# Patient Record
Sex: Female | Born: 1965
Health system: Southern US, Community
[De-identification: ages and names within clinical notes are randomized; demographics above are authoritative.]

## PROBLEM LIST (undated history)

## (undated) DIAGNOSIS — E538 Deficiency of other specified B group vitamins: Secondary | ICD-10-CM

## (undated) DIAGNOSIS — R7303 Prediabetes: Secondary | ICD-10-CM

## (undated) DIAGNOSIS — F419 Anxiety disorder, unspecified: Secondary | ICD-10-CM

## (undated) DIAGNOSIS — G4730 Sleep apnea, unspecified: Secondary | ICD-10-CM

## (undated) DIAGNOSIS — I10 Essential (primary) hypertension: Secondary | ICD-10-CM

## (undated) DIAGNOSIS — M1990 Unspecified osteoarthritis, unspecified site: Secondary | ICD-10-CM

## (undated) DIAGNOSIS — D649 Anemia, unspecified: Secondary | ICD-10-CM

## (undated) DIAGNOSIS — M4316 Spondylolisthesis, lumbar region: Secondary | ICD-10-CM

## (undated) DIAGNOSIS — E119 Type 2 diabetes mellitus without complications: Secondary | ICD-10-CM

## (undated) DIAGNOSIS — Z862 Personal history of diseases of the blood and blood-forming organs and certain disorders involving the immune mechanism: Secondary | ICD-10-CM

## (undated) DIAGNOSIS — F32A Depression, unspecified: Secondary | ICD-10-CM

## (undated) DIAGNOSIS — M545 Low back pain, unspecified: Secondary | ICD-10-CM

## (undated) DIAGNOSIS — E669 Obesity, unspecified: Secondary | ICD-10-CM

## (undated) DIAGNOSIS — E785 Hyperlipidemia, unspecified: Secondary | ICD-10-CM

## (undated) DIAGNOSIS — F329 Major depressive disorder, single episode, unspecified: Secondary | ICD-10-CM

## (undated) DIAGNOSIS — M7918 Myalgia, other site: Secondary | ICD-10-CM

## (undated) DIAGNOSIS — K219 Gastro-esophageal reflux disease without esophagitis: Secondary | ICD-10-CM

## (undated) HISTORY — DX: Spondylolisthesis, lumbar region: M43.16

## (undated) HISTORY — DX: Hyperlipidemia, unspecified: E78.5

## (undated) HISTORY — PX: LUMBAR LAMINECTOMY: SHX95

## (undated) HISTORY — DX: Anemia, unspecified: D64.9

## (undated) HISTORY — PX: TUBAL LIGATION: SHX77

## (undated) HISTORY — PX: ABDOMINAL HYSTERECTOMY: SHX81

## (undated) HISTORY — DX: Low back pain, unspecified: M54.50

## (undated) HISTORY — DX: Myalgia, other site: M79.18

## (undated) HISTORY — DX: Obesity, unspecified: E66.9

## (undated) HISTORY — DX: Anxiety disorder, unspecified: F41.9

---
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
## 2009-05-09 IMAGING — CR DG KNEE COMPLETE 4+V*R*
4 series · 4 of 4 positions shown · non-contrast
Comparison: None.

CLINICAL DATA: Right knee pain with inability to bear weight.

RIGHT KNEE - COMPLETE 4+ VIEW

[t knee ap right]
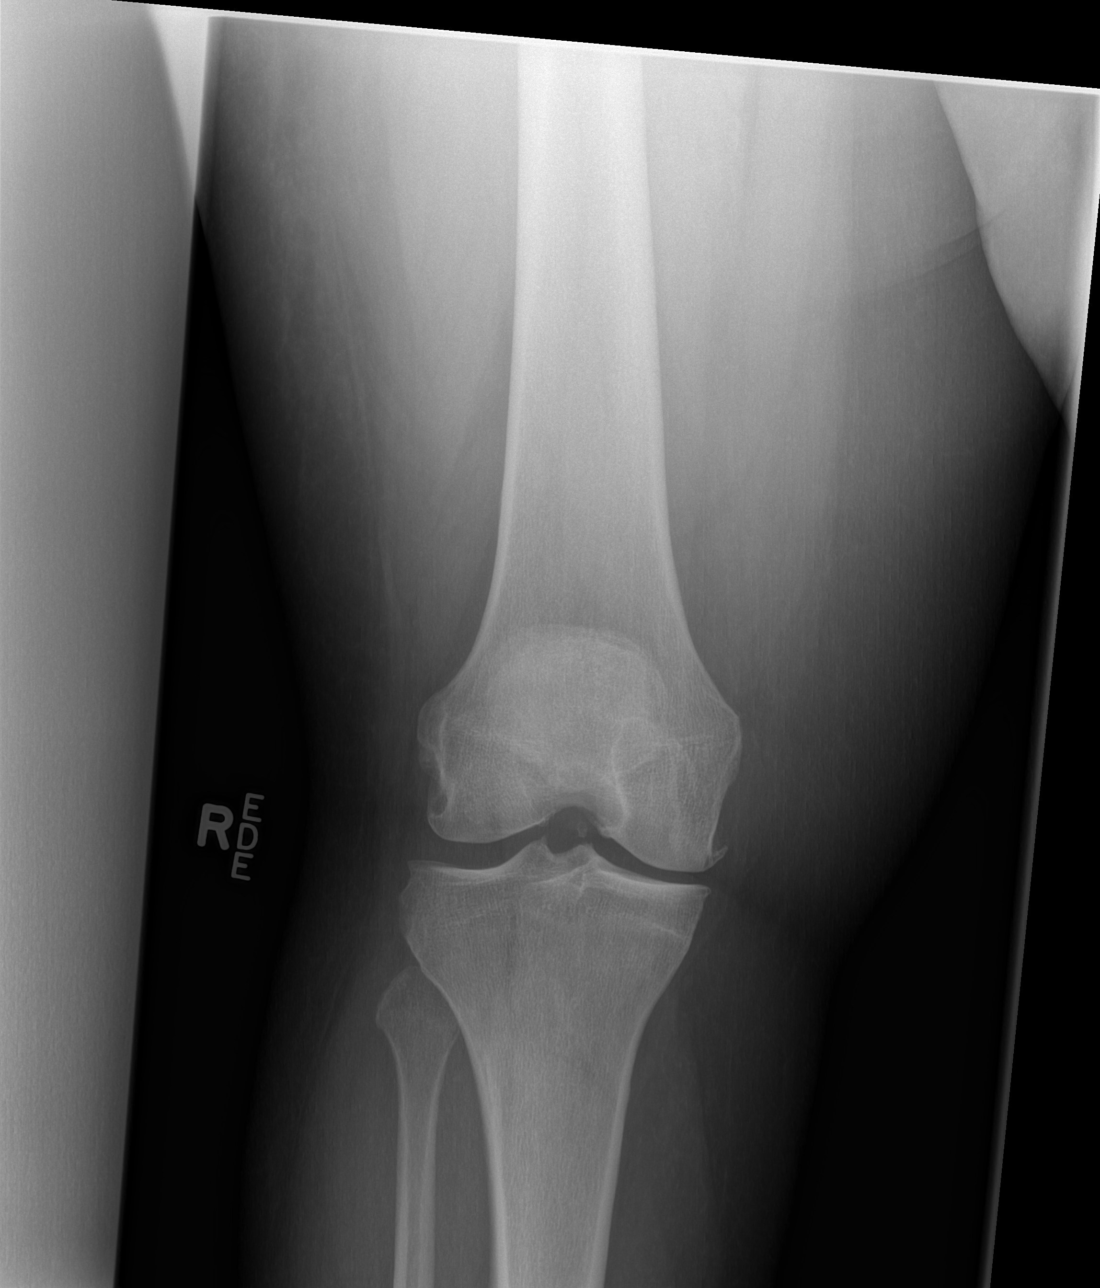

[t knee oblique right (1 of 2)]
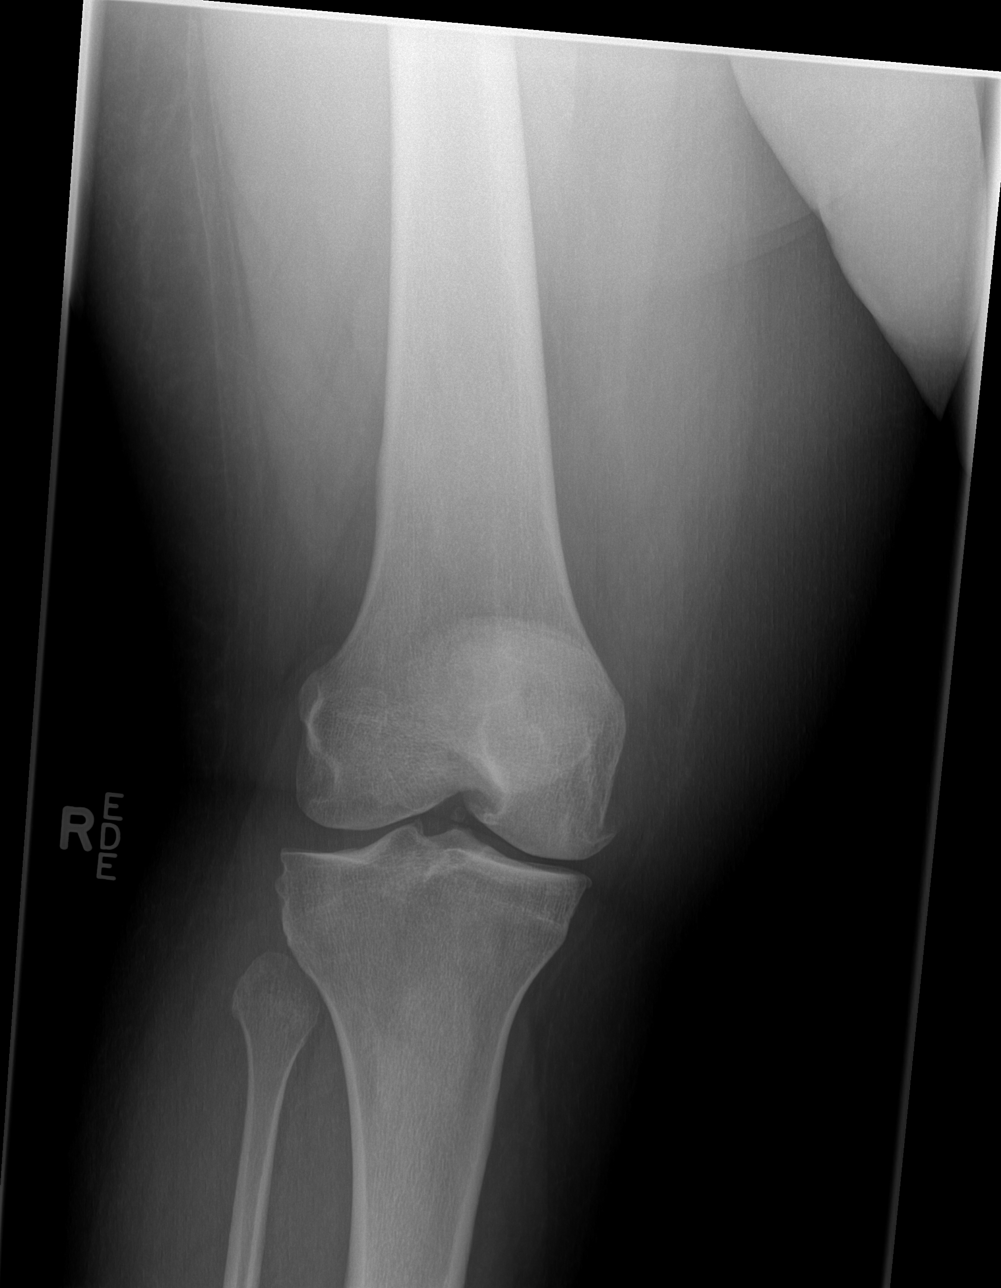

[t knee oblique right (2 of 2)]
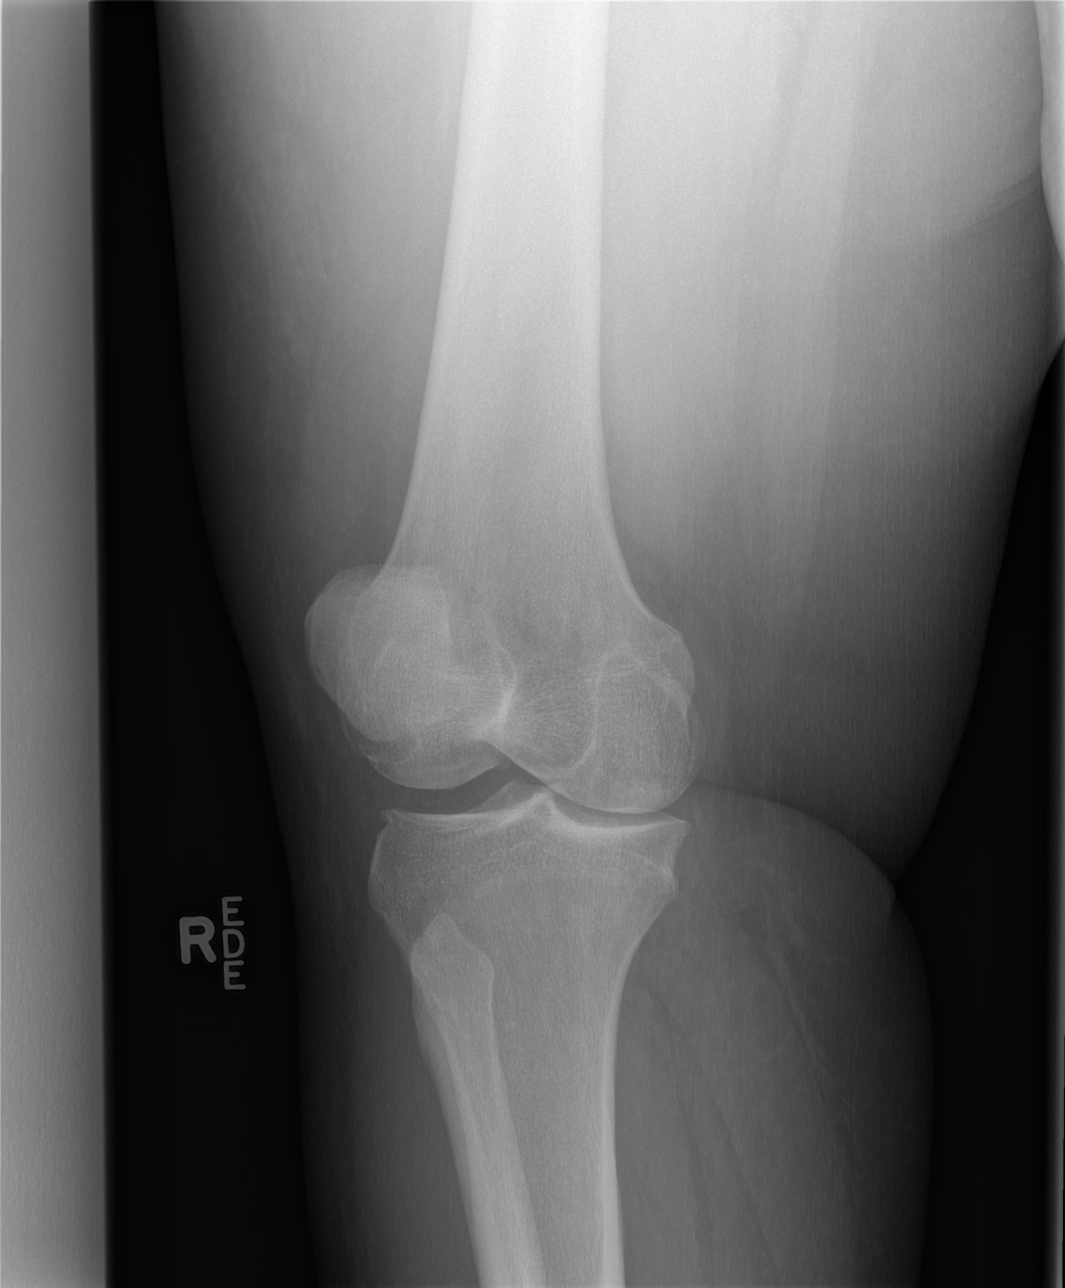

[t knee lat right]
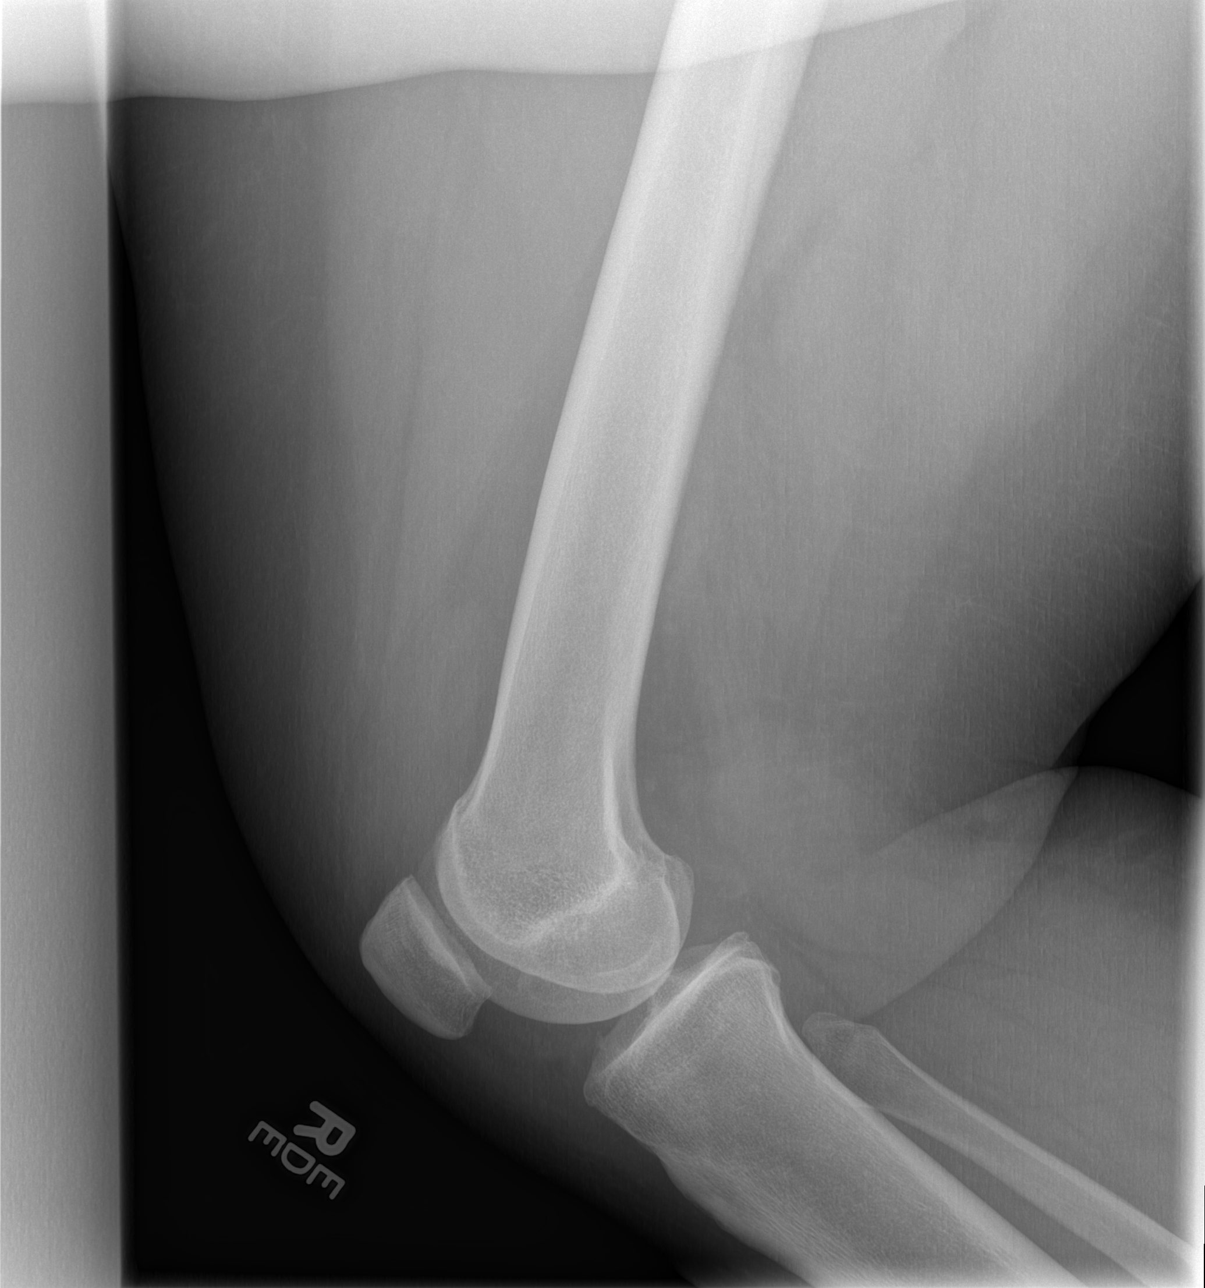

[4 of 4 positions shown; findings below may reference images not displayed]

FINDINGS: There are moderate degenerative changes noted of the
medial compartment.  There is no definite fracture or dislocation.
Possible small joint effusion on the lateral view.
IMPRESSION: Degenerative changes and possible small joint effusion - otherwise
no acute findings.

## 2009-10-21 ENCOUNTER — Emergency Department (HOSPITAL_COMMUNITY): Admission: EM | Admit: 2009-10-21 | Discharge: 2009-10-21 | Payer: Self-pay | Admitting: Emergency Medicine

## 2010-01-05 ENCOUNTER — Ambulatory Visit: Payer: Self-pay | Admitting: Interventional Radiology

## 2010-01-05 ENCOUNTER — Emergency Department (HOSPITAL_BASED_OUTPATIENT_CLINIC_OR_DEPARTMENT_OTHER): Admission: EM | Admit: 2010-01-05 | Discharge: 2010-01-05 | Payer: Self-pay | Admitting: Emergency Medicine
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
## 2010-04-20 ENCOUNTER — Emergency Department (HOSPITAL_BASED_OUTPATIENT_CLINIC_OR_DEPARTMENT_OTHER): Admission: EM | Admit: 2010-04-20 | Discharge: 2010-04-20 | Payer: Self-pay | Admitting: Emergency Medicine

## 2010-04-20 ENCOUNTER — Ambulatory Visit: Payer: Self-pay | Admitting: Diagnostic Radiology

## 2010-04-20 IMAGING — CR DG LUMBAR SPINE COMPLETE 4+V
5 series · 5 of 5 positions shown · non-contrast
Comparison: [DATE]

CLINICAL DATA: Low back pain for 1 week.

LUMBAR SPINE - COMPLETE 4+ VIEW

[t l-spine a.p.]
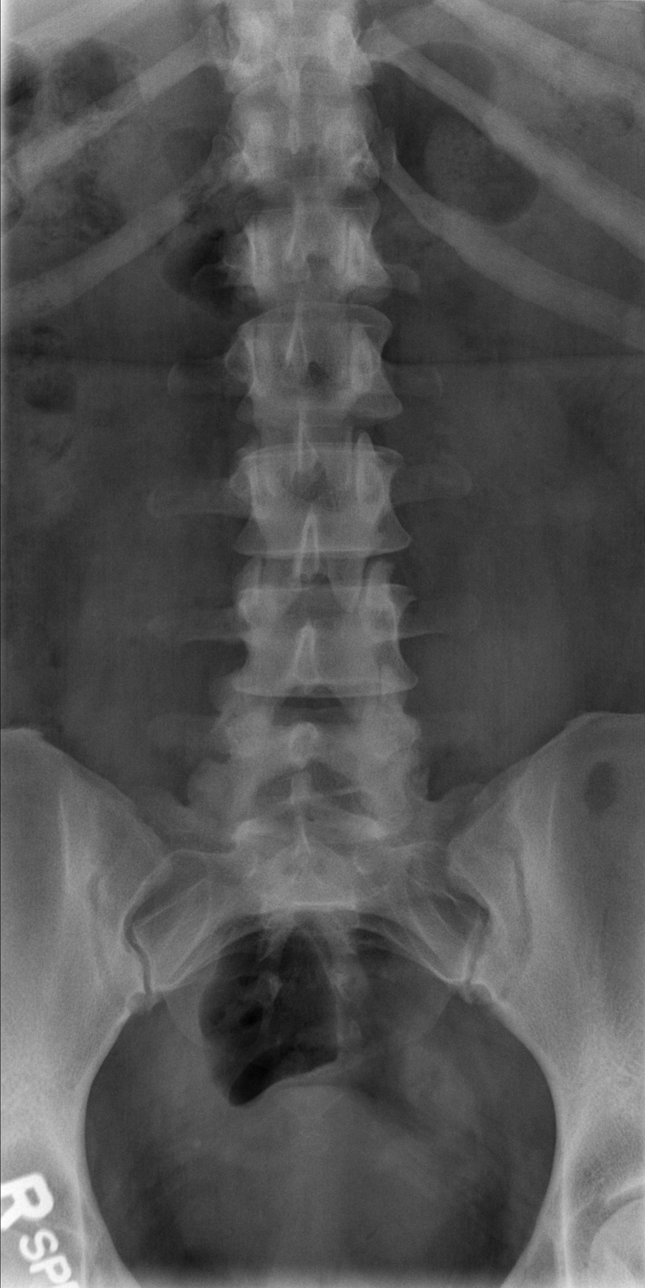

[t l-spine oblique exposure (1 of 2)]
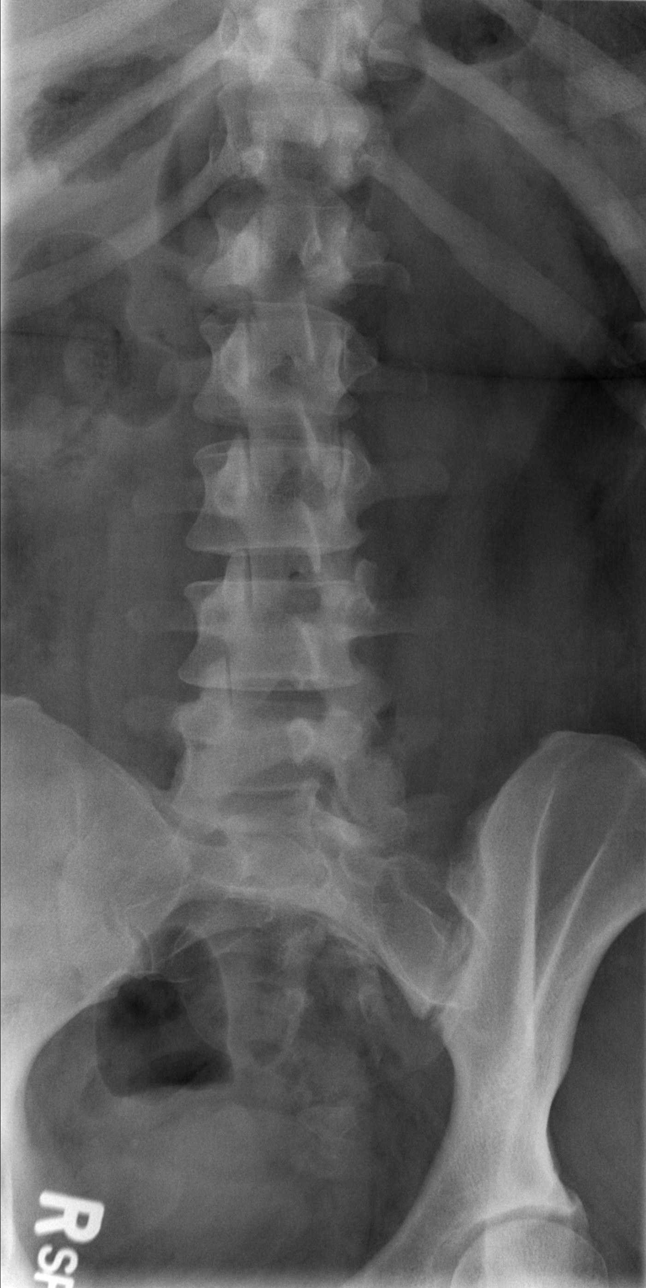

[t l-spine oblique exposure (2 of 2)]
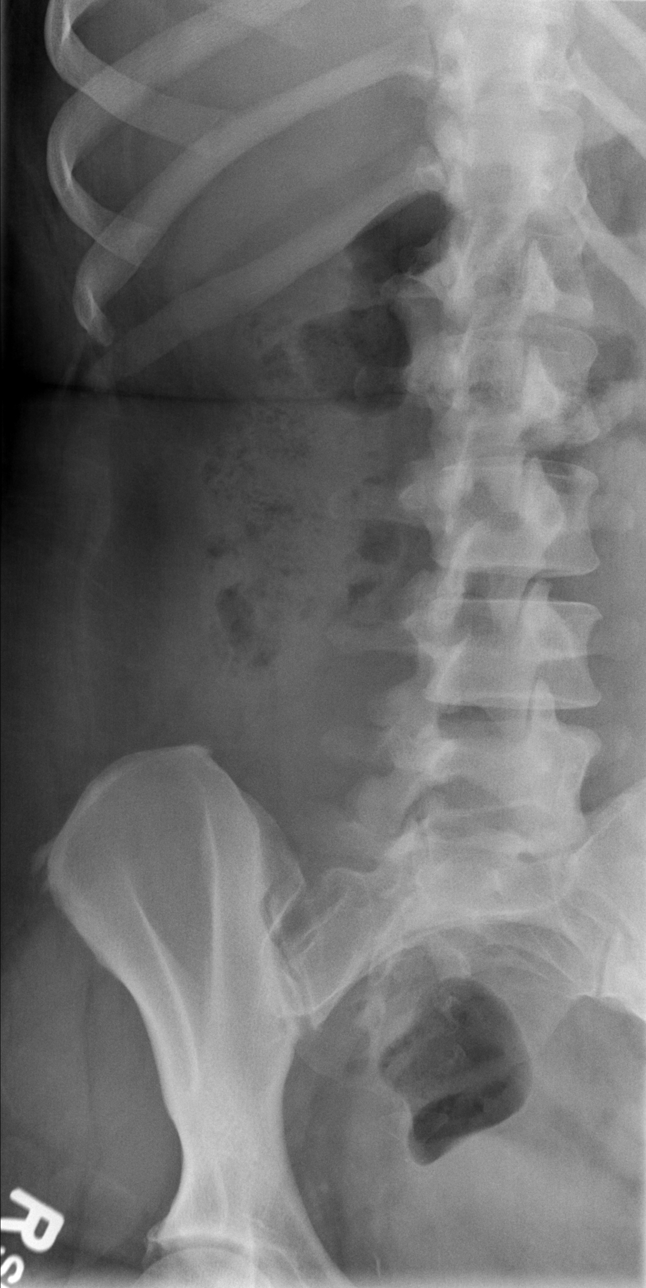

[t l-spine lat]
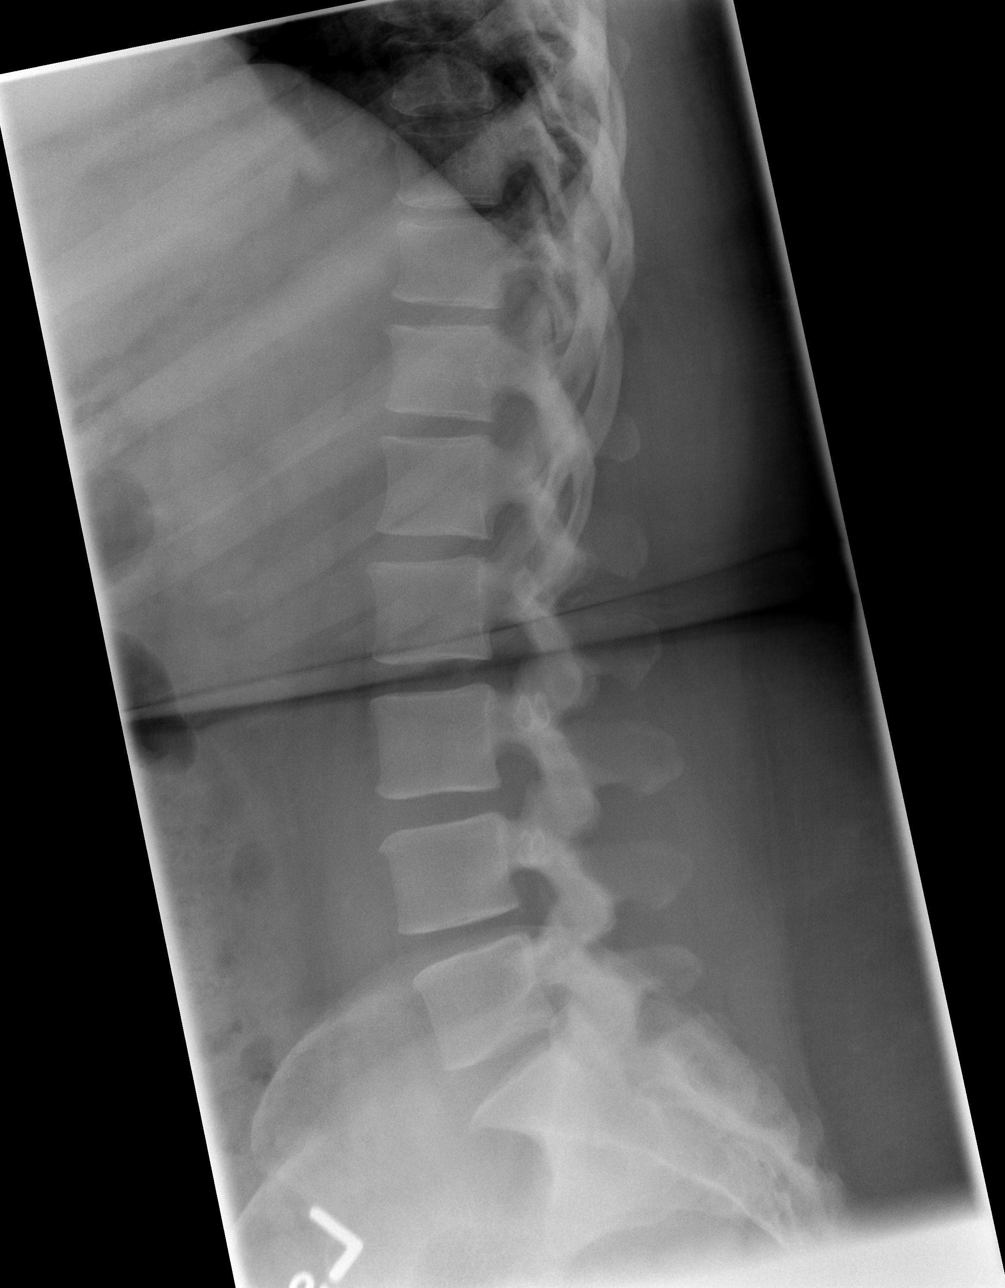

[t l-spine l5-s1 spot]
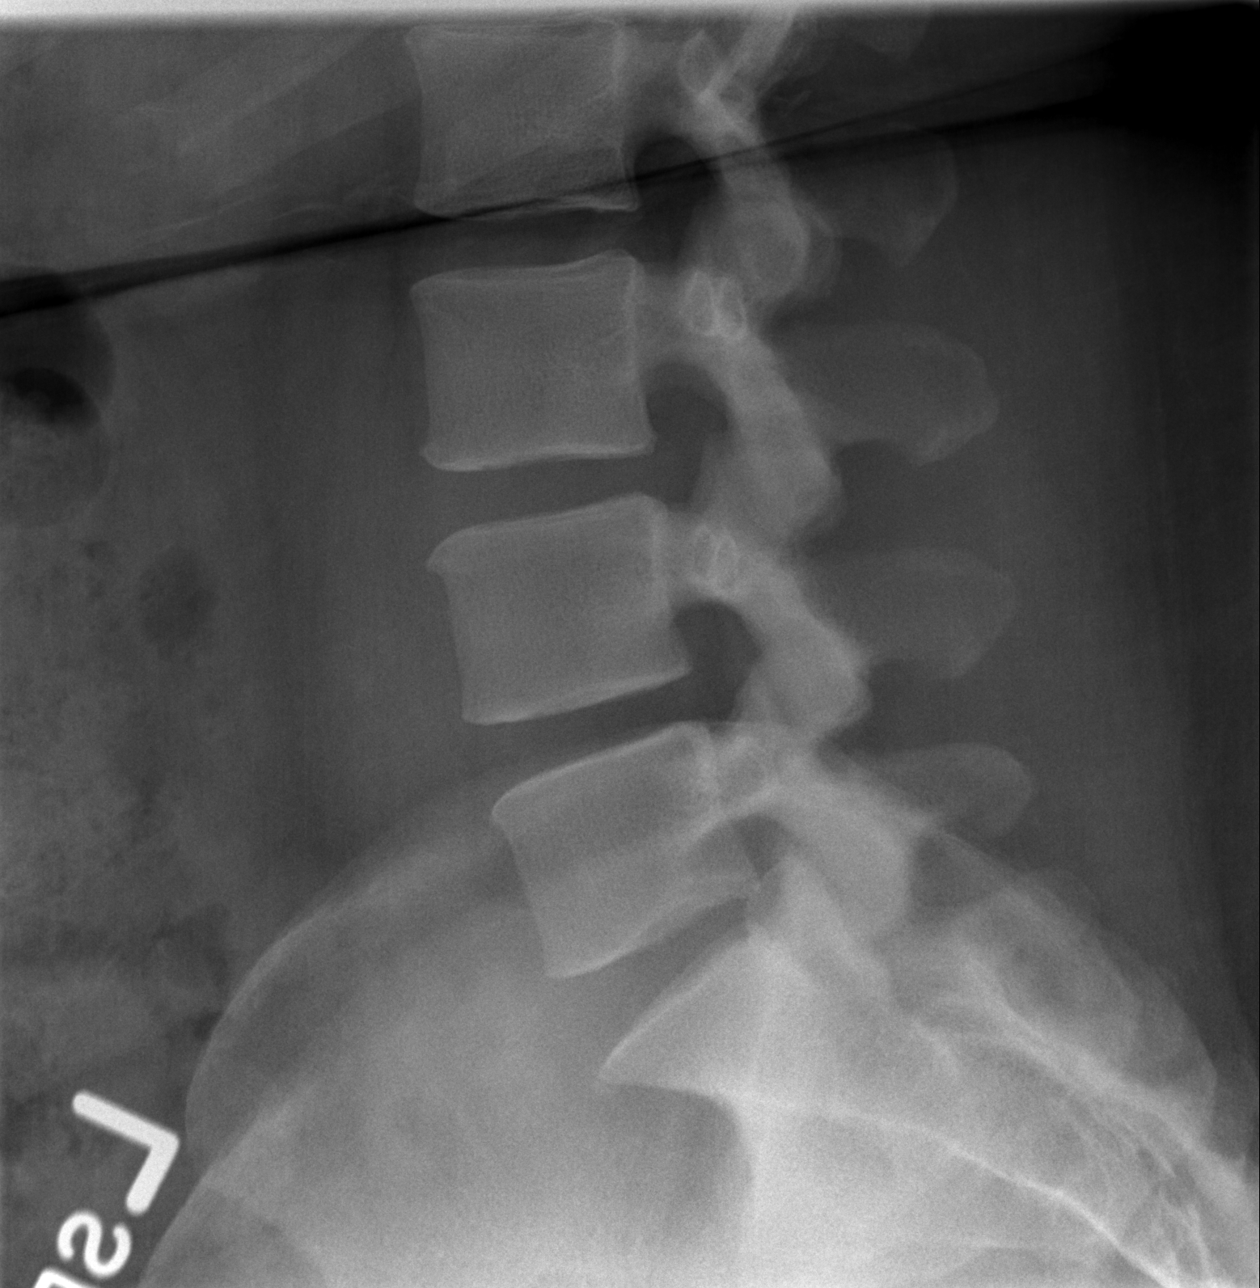

[5 of 5 positions shown; findings below may reference images not displayed]

FINDINGS: Lumbar vertebral alignment appears normal.

No lumbar spine fracture or acute subluxation is identified.  No
acute lumbar spine findings noted.
IMPRESSION: No significant abnormality identified.

## 2011-02-23 LAB — URINALYSIS, ROUTINE W REFLEX MICROSCOPIC
Bilirubin Urine: NEGATIVE
Glucose, UA: NEGATIVE mg/dL
Ketones, ur: NEGATIVE mg/dL
Leukocytes, UA: NEGATIVE
Nitrite: NEGATIVE
Protein, ur: NEGATIVE mg/dL
Specific Gravity, Urine: 1.021 (ref 1.005–1.030)
Urobilinogen, UA: 0.2 mg/dL (ref 0.0–1.0)
pH: 5.5 (ref 5.0–8.0)

## 2011-02-23 LAB — URINE MICROSCOPIC-ADD ON

## 2012-08-14 ENCOUNTER — Emergency Department (HOSPITAL_BASED_OUTPATIENT_CLINIC_OR_DEPARTMENT_OTHER)
Admission: EM | Admit: 2012-08-14 | Discharge: 2012-08-14 | Disposition: A | Discharge status: Home / Self Care | Payer: Self-pay | Attending: Emergency Medicine | Admitting: Emergency Medicine

## 2012-08-14 ENCOUNTER — Emergency Department (HOSPITAL_BASED_OUTPATIENT_CLINIC_OR_DEPARTMENT_OTHER): Payer: Self-pay

## 2012-08-14 ENCOUNTER — Encounter (HOSPITAL_BASED_OUTPATIENT_CLINIC_OR_DEPARTMENT_OTHER): Payer: Self-pay | Admitting: *Deleted

## 2012-08-14 DIAGNOSIS — I10 Essential (primary) hypertension: Secondary | ICD-10-CM | POA: Insufficient documentation

## 2012-08-14 DIAGNOSIS — M25512 Pain in left shoulder: Secondary | ICD-10-CM

## 2012-08-14 DIAGNOSIS — F17200 Nicotine dependence, unspecified, uncomplicated: Secondary | ICD-10-CM | POA: Insufficient documentation

## 2012-08-14 DIAGNOSIS — M25519 Pain in unspecified shoulder: Secondary | ICD-10-CM | POA: Insufficient documentation

## 2012-08-14 HISTORY — DX: Essential (primary) hypertension: I10

## 2012-08-14 IMAGING — CR DG SHOULDER 2+V*L*
3 series · 3 of 3 positions shown · non-contrast
Comparison: No priors.

CLINICAL DATA: Left-sided shoulder pain.

LEFT SHOULDER - 2+ VIEW

[w shoulder ap internal left]
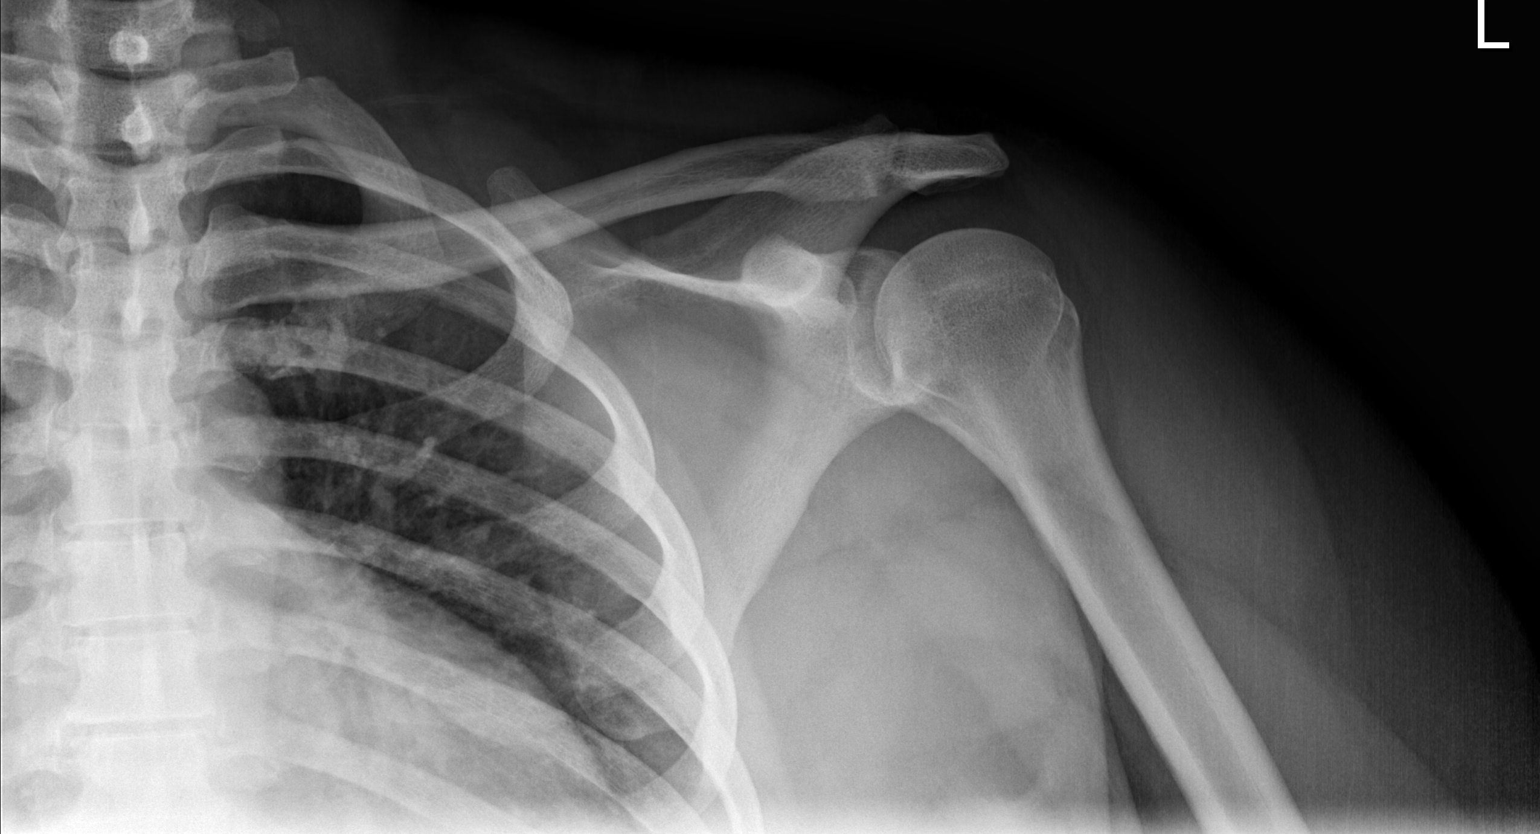

[w shoulder ap external left]
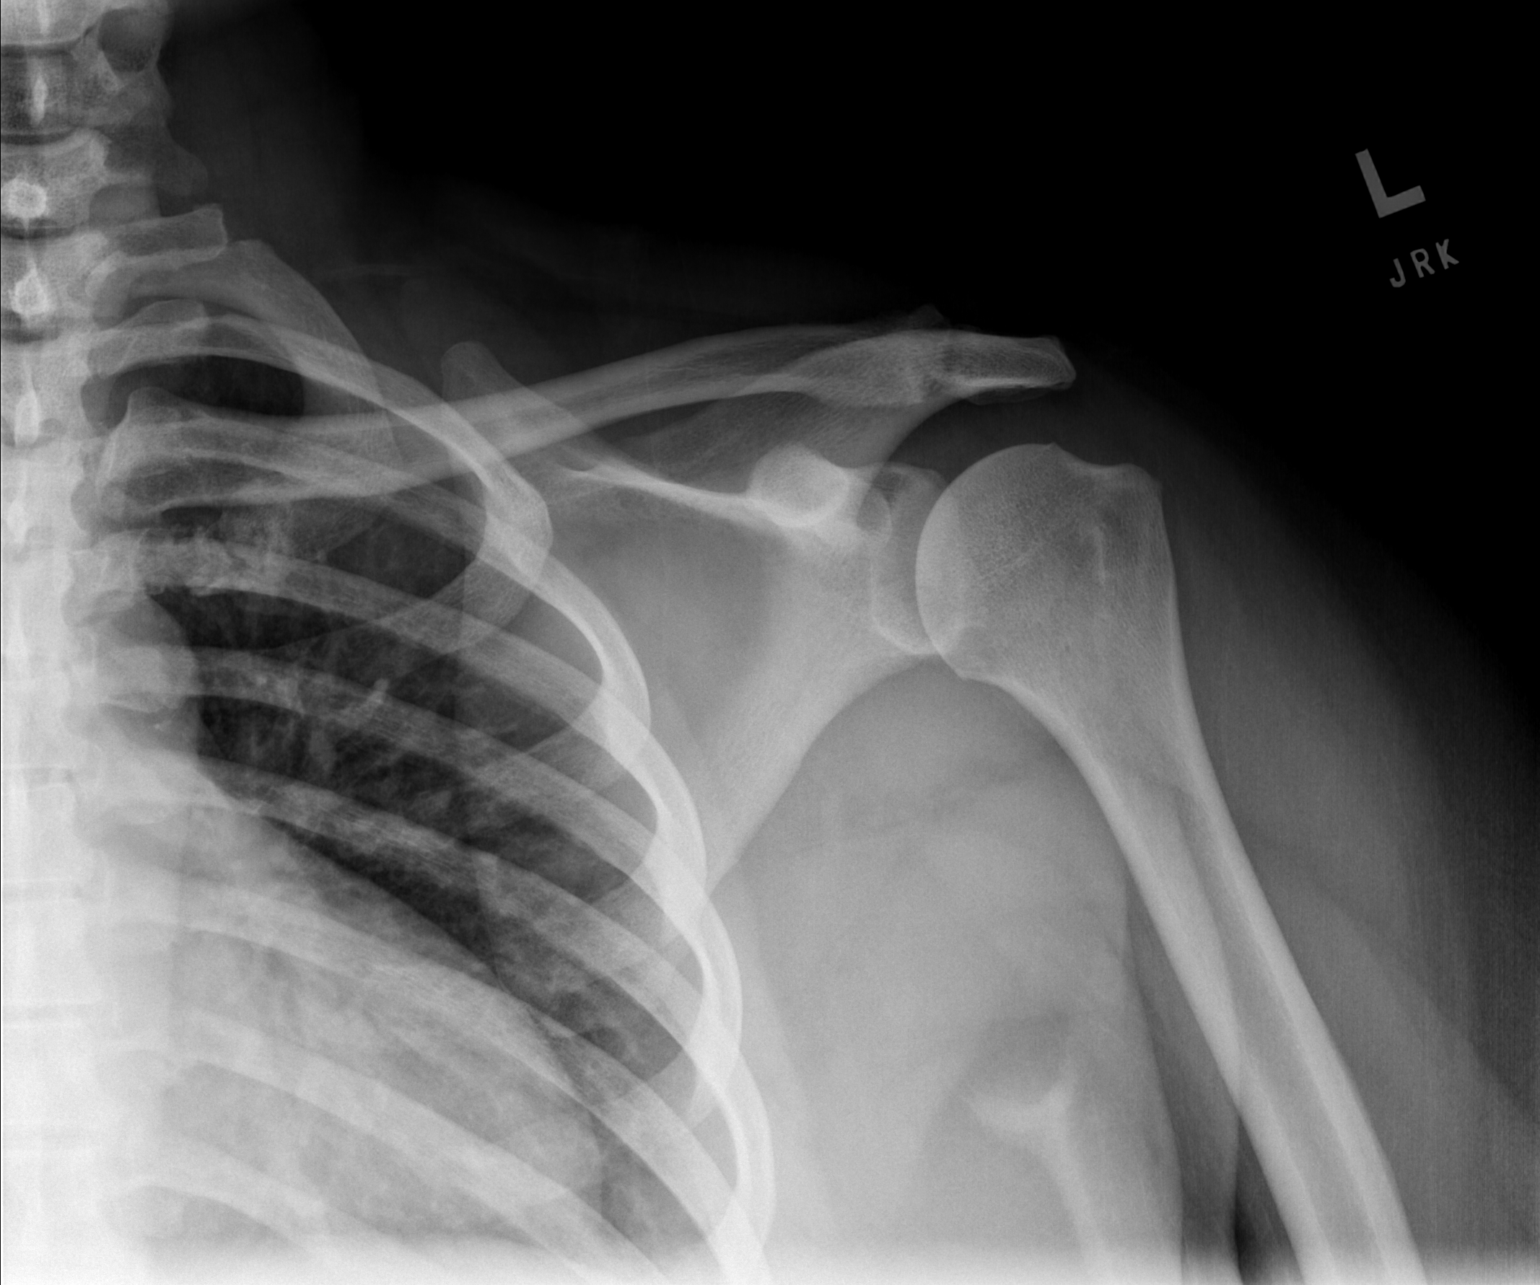

[w shoulder y view left]
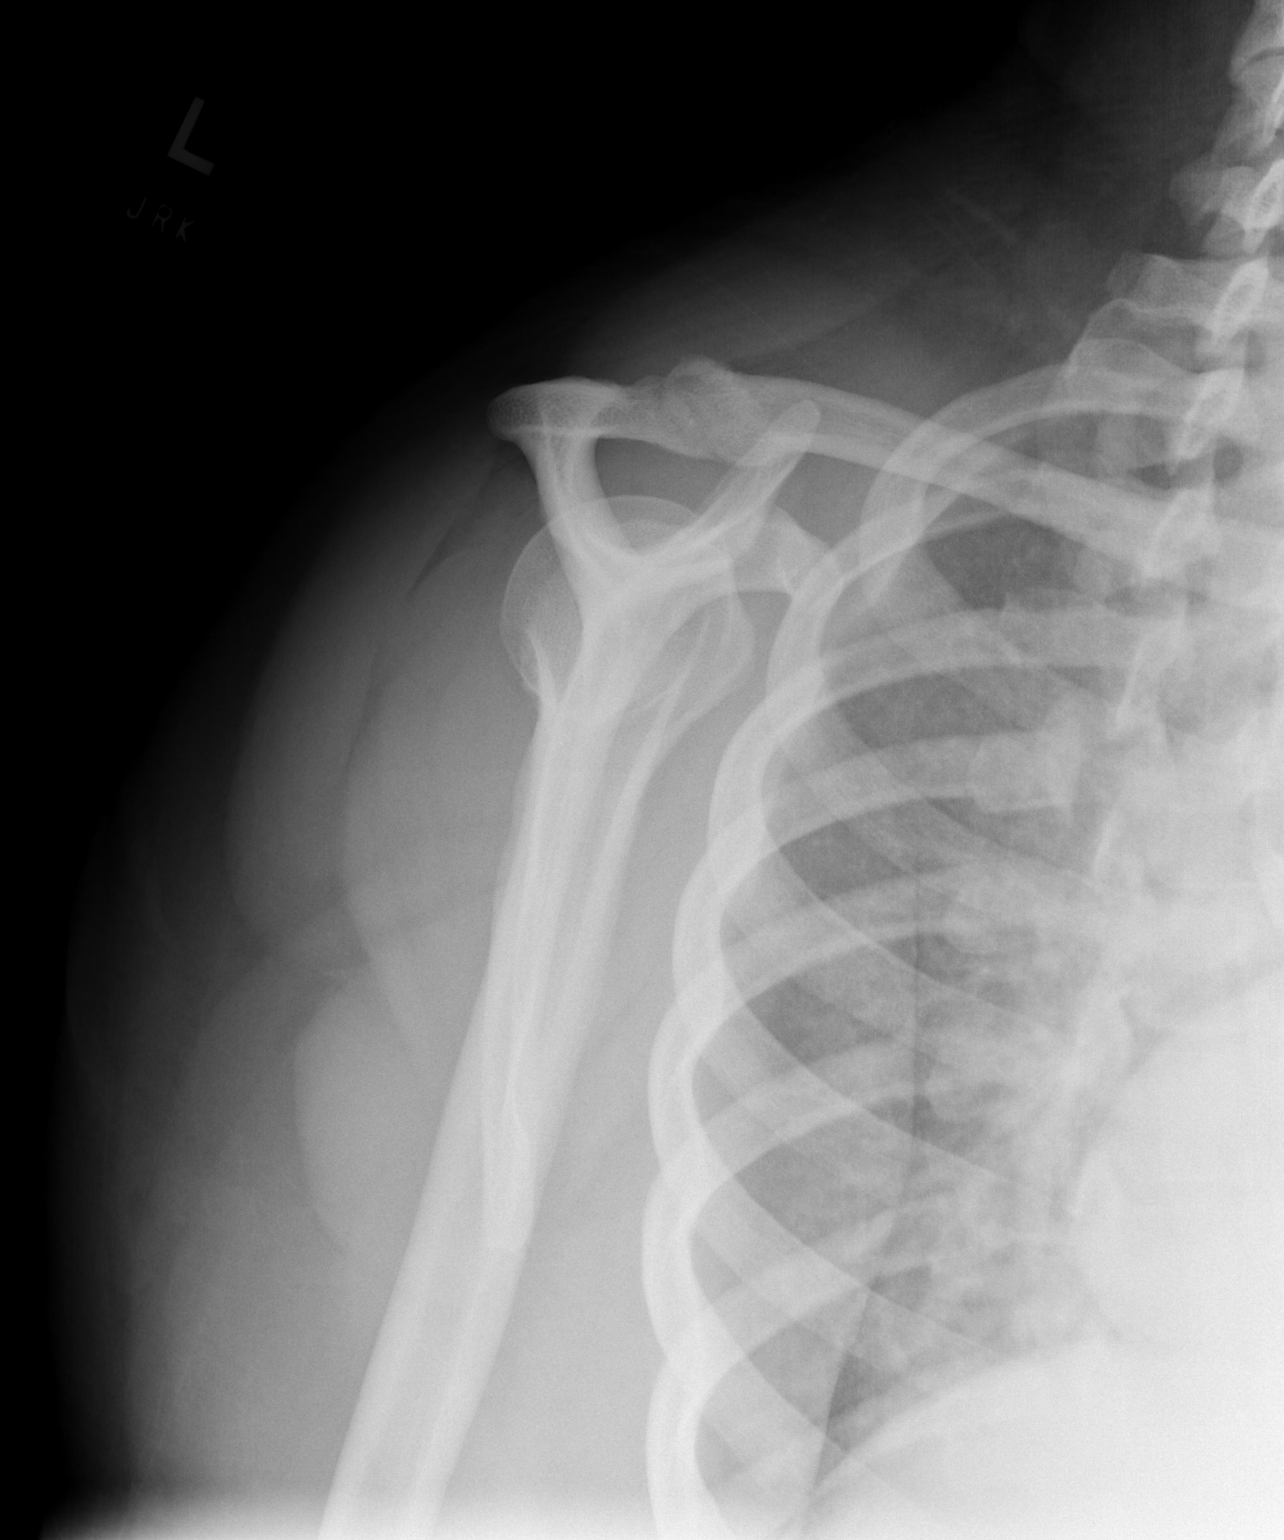

[3 of 3 positions shown; findings below may reference images not displayed]

FINDINGS: Three views of the left shoulder demonstrate no acute
fracture, subluxation, dislocation, joint or soft tissue
abnormality.  Mild degenerative changes of osteoarthritis at the
acromioclavicular joint are noted.
IMPRESSION: 1.  No acute radiographic abnormality of the left shoulder.
2.  Mild left acromioclavicular joint osteoarthritis.

## 2012-08-14 MED ORDER — TRAMADOL HCL 50 MG PO TABS: 50.0000 mg | ORAL_TABLET | Freq: Four times a day (QID) | ORAL | Status: AC | PRN

## 2012-08-14 MED ORDER — IBUPROFEN 800 MG PO TABS
800.0000 mg | ORAL_TABLET | Freq: Once | ORAL | Status: AC
  Administered 2012-08-14: 800 mg via ORAL
  Filled 2012-08-14: qty 1

## 2012-08-14 NOTE — ED Notes (Signed)
Patient reports L shoulder pain over the last week that seems to hurt more over the past two days, has been taking 800 mg advil but no relief, no injury that she is aware of

## 2012-08-18 NOTE — ED Provider Notes (Signed)
History     CSN: [id number]  Arrival date & time 08/14/12  4098   First MD Initiated Contact with Patient 08/14/12 [phone]      Chief Complaint  Patient presents with  . Shoulder Pain    (Consider location/radiation/quality/duration/timing/severity/associated sxs/prior treatment) HPI Patient is a 46 yo female who presents with 8/10 aching left shoulder pain with no associated injury that began 1 week ago and increased significantly over the past 2 days.  The pain is worse with movement and palpation.  There is no radiation of the pain and there are no other associated symptoms noted.  Patient woke with pain present one week ago.  No prior injuries or history of same.  No significant improvement with ibuprofen and ice at home. There are no other associated or modifying factors.  Past Medical History  Diagnosis Date  . Hypertension     Past Surgical History  Procedure Date  . Tubal ligation     History reviewed. No pertinent family history.  History  Substance Use Topics  . Smoking status: Current Some Day Smoker  . Smokeless tobacco: Not on file  . Alcohol Use: Yes    OB History    Grav Para Term Preterm Abortions TAB SAB Ect Mult Living                  Review of Systems  Constitutional: Negative.   HENT: Negative.   Eyes: Negative.   Respiratory: Negative.   Cardiovascular: Negative.   Gastrointestinal: Negative.   Genitourinary: Negative.   Musculoskeletal:       Left shoulder pain  Skin: Negative.   Neurological: Negative.   Hematological: Negative.   Psychiatric/Behavioral: Negative.   All other systems reviewed and are negative.    Allergies  Review of patient's allergies indicates no known allergies.  Home Medications   Current Outpatient Rx  Name Route Sig Dispense Refill  . CANDESARTAN CILEXETIL 16 MG PO TABS Oral Take 16 mg by mouth daily.    Marland Kitchen HYDROCHLOROTHIAZIDE 25 MG PO TABS Oral Take 25 mg by mouth daily.    . MELOXICAM 7.5 MG PO TABS  Oral Take 7.5 mg by mouth 2 (two) times daily.    Marland Kitchen VITAMIN D BOOSTER PO Oral Take 400 Units by mouth.    . TRAMADOL HCL 50 MG PO TABS Oral Take 1 tablet (50 mg total) by mouth every 6 (six) hours as needed for pain. 30 tablet 0    BP 134/77  Pulse 96  Temp 98.2 F (36.8 C)  Resp 19  SpO2 100%  LMP 07/27/2012  Physical Exam  Nursing note and vitals reviewed. GEN: Well-developed, obese female in no distress HEENT: Atraumatic, normocephalic.  EYES: PERRLA BL, no scleral icterus. NECK: Trachea midline, no meningismus Neuro: cranial nerves grossly 2-12 intact, no abnormalities of strength or sensation, A and O x 3 MSK: Patient moves all 4 extremities symmetrically, no deformity, edema, or injury noted. Left shoulder with pain on palpation and movement but full ROM. Skin: No rashes petechiae, purpura, or jaundice Psych: no abnormality of mood   ED Course  Procedures (including critical care time)  Labs Reviewed - No data to display No results found.   1. Left shoulder pain       MDM  Patient was evaluated and given a dose of ibuprofen while plain film was performed.  Film was reviewed by myself with only changes of osteoarthritis noted.  Patient had been taking ibuprofen and using ice without  relief.  Advised to continue these and also given sling and Rx for tramadol with instructions to follow-up with Dr. Pearletha Forge.  Patient instructed to follow ROM instructions in sling discharge instructions to avoid frozen shoulder.  Patient discharged in good condition.        Cyndra Numbers, MD 08/18/12 1155

## 2012-12-11 ENCOUNTER — Encounter (HOSPITAL_BASED_OUTPATIENT_CLINIC_OR_DEPARTMENT_OTHER): Payer: Self-pay | Admitting: Emergency Medicine

## 2012-12-11 ENCOUNTER — Emergency Department (HOSPITAL_BASED_OUTPATIENT_CLINIC_OR_DEPARTMENT_OTHER)
Admission: EM | Admit: 2012-12-11 | Discharge: 2012-12-11 | Disposition: A | Discharge status: Home / Self Care | Payer: Self-pay | Attending: Emergency Medicine | Admitting: Emergency Medicine

## 2012-12-11 DIAGNOSIS — X58XXXA Exposure to other specified factors, initial encounter: Secondary | ICD-10-CM | POA: Insufficient documentation

## 2012-12-11 DIAGNOSIS — M25519 Pain in unspecified shoulder: Secondary | ICD-10-CM | POA: Insufficient documentation

## 2012-12-11 DIAGNOSIS — F17200 Nicotine dependence, unspecified, uncomplicated: Secondary | ICD-10-CM | POA: Insufficient documentation

## 2012-12-11 DIAGNOSIS — Y929 Unspecified place or not applicable: Secondary | ICD-10-CM | POA: Insufficient documentation

## 2012-12-11 DIAGNOSIS — S161XXA Strain of muscle, fascia and tendon at neck level, initial encounter: Secondary | ICD-10-CM

## 2012-12-11 DIAGNOSIS — S139XXA Sprain of joints and ligaments of unspecified parts of neck, initial encounter: Secondary | ICD-10-CM | POA: Insufficient documentation

## 2012-12-11 DIAGNOSIS — I10 Essential (primary) hypertension: Secondary | ICD-10-CM | POA: Insufficient documentation

## 2012-12-11 DIAGNOSIS — Z79899 Other long term (current) drug therapy: Secondary | ICD-10-CM | POA: Insufficient documentation

## 2012-12-11 DIAGNOSIS — Y939 Activity, unspecified: Secondary | ICD-10-CM | POA: Insufficient documentation

## 2012-12-11 MED ORDER — HYDROCODONE-ACETAMINOPHEN 5-325 MG PO TABS: 2.0000 | ORAL_TABLET | ORAL | Status: DC | PRN

## 2012-12-11 MED ORDER — CYCLOBENZAPRINE HCL 10 MG PO TABS: 10.0000 mg | ORAL_TABLET | Freq: Two times a day (BID) | ORAL | Status: DC | PRN

## 2012-12-11 NOTE — ED Provider Notes (Signed)
History     CSN: [id number]  Arrival date & time 12/11/12  1059   First MD Initiated Contact with Patient 12/11/12 1211      Chief Complaint  Patient presents with  . Shoulder Pain  . Neck Pain    (Consider location/radiation/quality/duration/timing/severity/associated sxs/prior treatment) HPI Comments: Patient presents with a 2 to three-week history of some pain across her neck. She states that at times on the right side at times on the left side right now she complains of pain across the right side of her neck. She states that at times it shoots down her arms. She has some intermittent tingling in her right hand but denies any current numbness or weakness in her hands. She denies any chest pain or shortness of breath. She denies any pleuritic-type pain. She denies any recent injuries to her neck. She denies he passed injuries at problems.  Patient is a 47 y.o. female presenting with shoulder pain and neck pain.  Shoulder Pain Pertinent negatives include no chest pain, no abdominal pain, no headaches and no shortness of breath.  Neck Pain  Pertinent negatives include no chest pain, no numbness, no headaches and no weakness.    Past Medical History  Diagnosis Date  . Hypertension     Past Surgical History  Procedure Date  . Tubal ligation     No family history on file.  History  Substance Use Topics  . Smoking status: Current Some Day Smoker  . Smokeless tobacco: Not on file  . Alcohol Use: Yes    OB History    Grav Para Term Preterm Abortions TAB SAB Ect Mult Living                  Review of Systems  Constitutional: Negative for fever, chills, diaphoresis and fatigue.  HENT: Positive for neck pain. Negative for congestion, rhinorrhea and sneezing.   Eyes: Negative.   Respiratory: Negative for cough, chest tightness and shortness of breath.   Cardiovascular: Negative for chest pain and leg swelling.  Gastrointestinal: Negative for nausea, vomiting, abdominal  pain, diarrhea and blood in stool.  Genitourinary: Negative for frequency, hematuria, flank pain and difficulty urinating.  Musculoskeletal: Negative for back pain and arthralgias.  Skin: Negative for rash.  Neurological: Negative for dizziness, speech difficulty, weakness, numbness and headaches.    Allergies  Review of patient's allergies indicates no known allergies.  Home Medications   Current Outpatient Rx  Name  Route  Sig  Dispense  Refill  . CANDESARTAN CILEXETIL 16 MG PO TABS   Oral   Take 16 mg by mouth daily.         . CYCLOBENZAPRINE HCL 10 MG PO TABS   Oral   Take 1 tablet (10 mg total) by mouth 2 (two) times daily as needed for muscle spasms.   20 tablet   0   . HYDROCHLOROTHIAZIDE 25 MG PO TABS   Oral   Take 25 mg by mouth daily.         Marland Kitchen HYDROCODONE-ACETAMINOPHEN 5-325 MG PO TABS   Oral   Take 2 tablets by mouth every 4 (four) hours as needed for pain.   15 tablet   0   . MELOXICAM 7.5 MG PO TABS   Oral   Take 7.5 mg by mouth 2 (two) times daily.         Marland Kitchen VITAMIN D BOOSTER PO   Oral   Take 400 Units by mouth.  BP 128/84  Pulse 90  Temp 98.8 F (37.1 C) (Oral)  Resp 20  Ht 5\' 4"  (1.626 m)  Wt 260 lb (117.935 kg)  BMI 44.63 kg/m2  SpO2 98%  LMP 12/07/2012  Physical Exam  Constitutional: She is oriented to person, place, and time. She appears well-developed and well-nourished.  HENT:  Head: Normocephalic and atraumatic.  Eyes: Pupils are equal, round, and reactive to light.  Neck: Normal range of motion. Neck supple.  Cardiovascular: Normal rate, regular rhythm and normal heart sounds.   Pulmonary/Chest: Effort normal and breath sounds normal. No respiratory distress. She has no wheezes. She has no rales. She exhibits no tenderness.  Abdominal: Soft. Bowel sounds are normal. There is no tenderness. There is no rebound and no guarding.  Musculoskeletal: Normal range of motion. She exhibits no edema.       Patient has  tenderness across her left trapezius muscle. There is no spinal tenderness noted. There is no pain on range of motion of the shoulder or the elbow. She has good radial pulses bilaterally. She has normal sensation in both hands. She has normal motor function in both arms.  Lymphadenopathy:    She has no cervical adenopathy.  Neurological: She is alert and oriented to person, place, and time.  Skin: Skin is warm and dry. No rash noted.  Psychiatric: She has a normal mood and affect.    ED Course  Procedures (including critical care time)  Labs Reviewed - No data to display No results found.   1. Neck strain       MDM  Patient with likely muscle strain. I gave her prescription for Vicodin and Flexeril. I advised her followup with her primary care physician if her symptoms are not improving or return here as needed for any worsening symptoms.        Rolan Bucco, MD 12/11/12 1335

## 2012-12-11 NOTE — ED Notes (Signed)
Shoulder and neck pain for several weeks.  No known injury.  No SOB. No recent traveling.

## 2013-05-13 DIAGNOSIS — M25569 Pain in unspecified knee: Secondary | ICD-10-CM | POA: Insufficient documentation

## 2013-12-02 ENCOUNTER — Emergency Department (HOSPITAL_BASED_OUTPATIENT_CLINIC_OR_DEPARTMENT_OTHER): Payer: Self-pay

## 2013-12-02 ENCOUNTER — Emergency Department (HOSPITAL_BASED_OUTPATIENT_CLINIC_OR_DEPARTMENT_OTHER)
Admission: EM | Admit: 2013-12-02 | Discharge: 2013-12-02 | Disposition: A | Discharge status: Home / Self Care | Payer: Self-pay | Attending: Emergency Medicine | Admitting: Emergency Medicine

## 2013-12-02 ENCOUNTER — Encounter (HOSPITAL_BASED_OUTPATIENT_CLINIC_OR_DEPARTMENT_OTHER): Payer: Self-pay | Admitting: Emergency Medicine

## 2013-12-02 DIAGNOSIS — Z79899 Other long term (current) drug therapy: Secondary | ICD-10-CM | POA: Insufficient documentation

## 2013-12-02 DIAGNOSIS — R52 Pain, unspecified: Secondary | ICD-10-CM | POA: Insufficient documentation

## 2013-12-02 DIAGNOSIS — F17200 Nicotine dependence, unspecified, uncomplicated: Secondary | ICD-10-CM | POA: Insufficient documentation

## 2013-12-02 DIAGNOSIS — M25519 Pain in unspecified shoulder: Secondary | ICD-10-CM | POA: Insufficient documentation

## 2013-12-02 DIAGNOSIS — I10 Essential (primary) hypertension: Secondary | ICD-10-CM | POA: Insufficient documentation

## 2013-12-02 DIAGNOSIS — M25512 Pain in left shoulder: Secondary | ICD-10-CM

## 2013-12-02 DIAGNOSIS — Z791 Long term (current) use of non-steroidal anti-inflammatories (NSAID): Secondary | ICD-10-CM | POA: Insufficient documentation

## 2013-12-02 DIAGNOSIS — R0789 Other chest pain: Secondary | ICD-10-CM | POA: Insufficient documentation

## 2013-12-02 LAB — CBC
HCT: 43 % (ref 36.0–46.0)
Hemoglobin: 14.6 g/dL (ref 12.0–15.0)
MCH: 31.9 pg (ref 26.0–34.0)
MCHC: 34 g/dL (ref 30.0–36.0)
MCV: 94.1 fL (ref 78.0–100.0)
Platelets: 309 10*3/uL (ref 150–400)
RBC: 4.57 MIL/uL (ref 3.87–5.11)
RDW: 13 % (ref 11.5–15.5)
WBC: 12.9 10*3/uL — ABNORMAL HIGH (ref 4.0–10.5)

## 2013-12-02 LAB — BASIC METABOLIC PANEL
BUN: 24 mg/dL — ABNORMAL HIGH (ref 6–23)
CO2: 25 mEq/L (ref 19–32)
Calcium: 9.4 mg/dL (ref 8.4–10.5)
Chloride: 100 mEq/L (ref 96–112)
Creatinine, Ser: 1 mg/dL (ref 0.50–1.10)
GFR calc Af Amer: 77 mL/min — ABNORMAL LOW (ref >90)
GFR calc non Af Amer: 66 mL/min — ABNORMAL LOW (ref >90)
Glucose, Bld: 94 mg/dL (ref 70–99)
Potassium: 4.1 mEq/L (ref 3.5–5.1)
Sodium: 137 mEq/L (ref 135–145)

## 2013-12-02 LAB — TROPONIN I: Troponin I: <0.3 ng/mL (ref <0.30)

## 2013-12-02 IMAGING — CR DG SHOULDER 2+V*L*
3 series · 3 of 3 positions shown · non-contrast
Comparison: [DATE]

CLINICAL DATA: Left shoulder pain

EXAM:
LEFT SHOULDER - 2+ VIEW

[w shoulder ap external left]
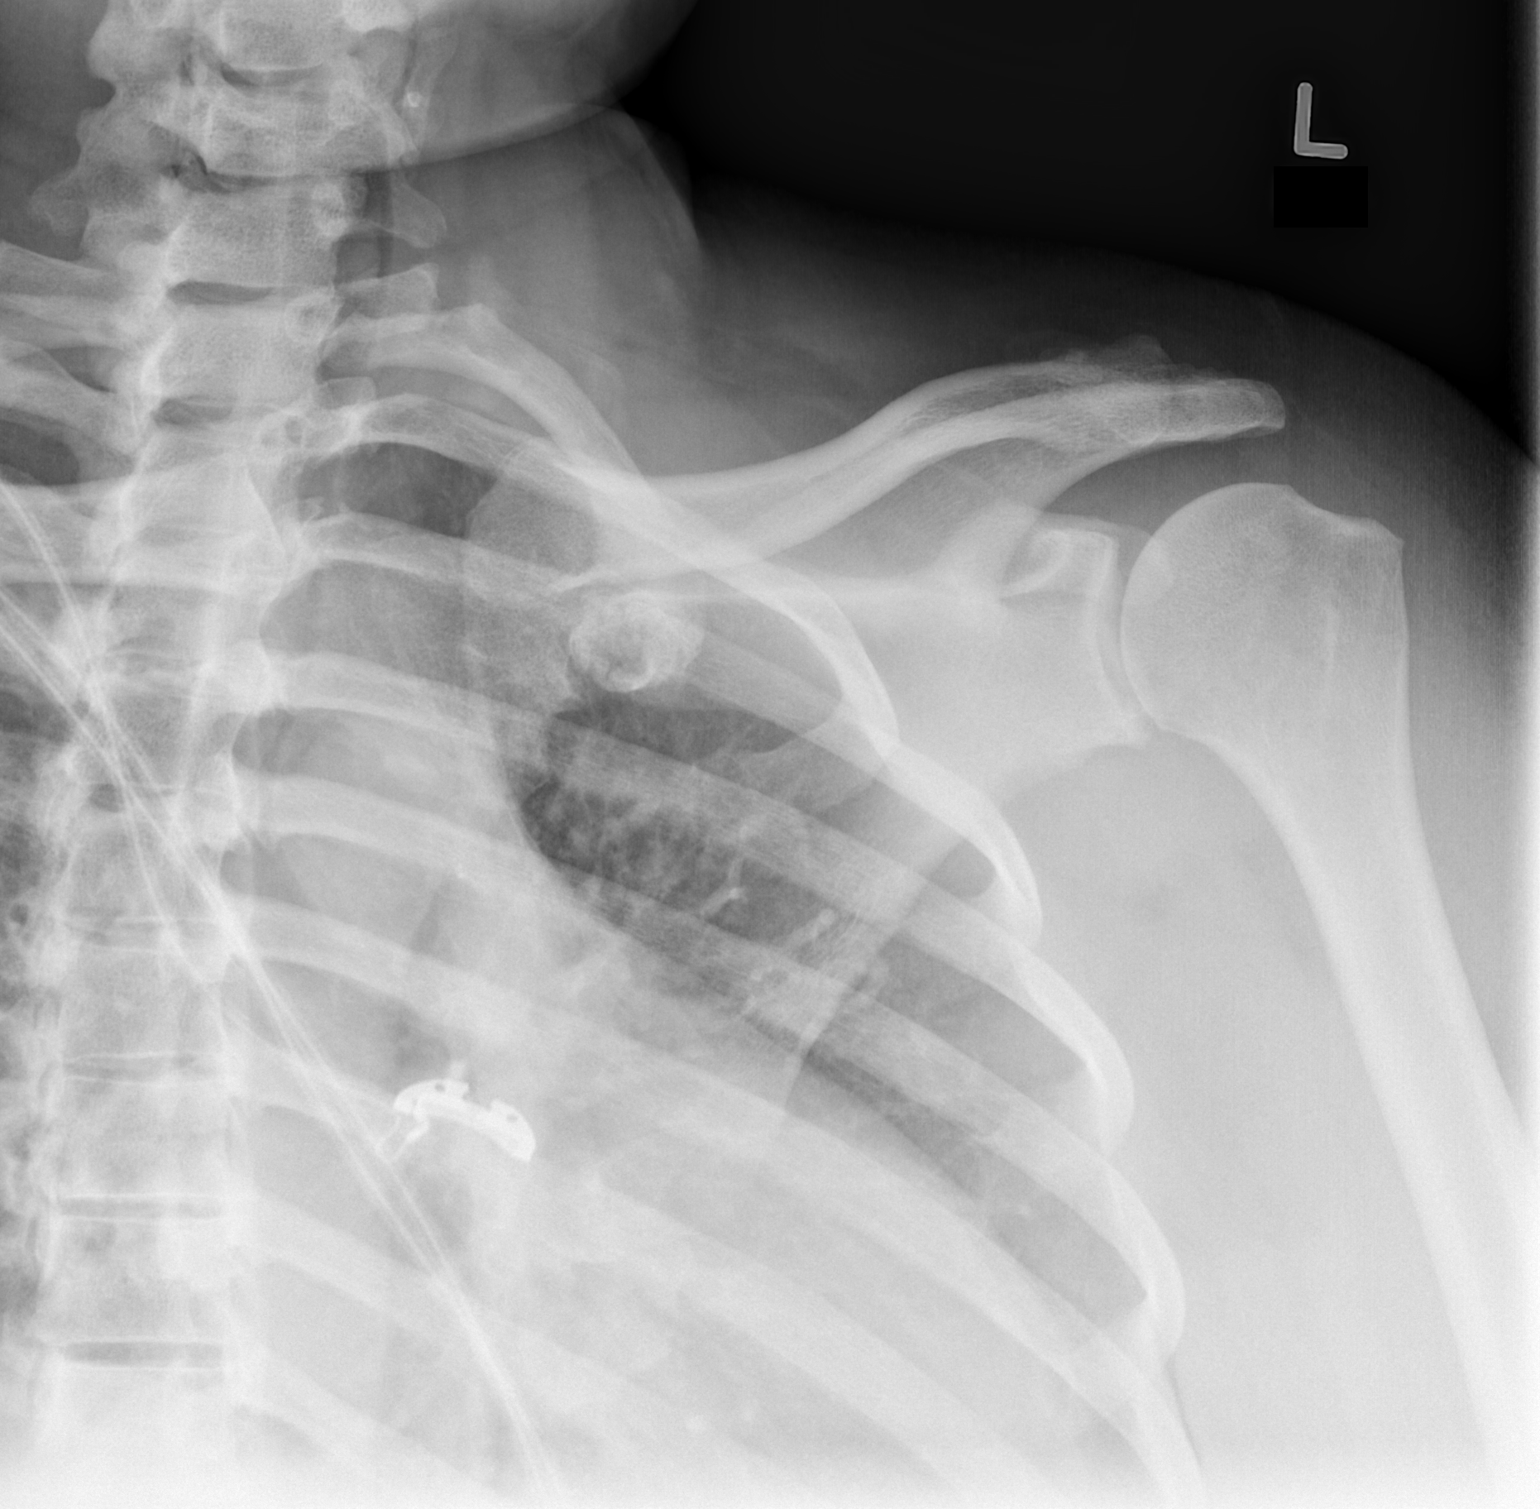

[w shoulder y view left]
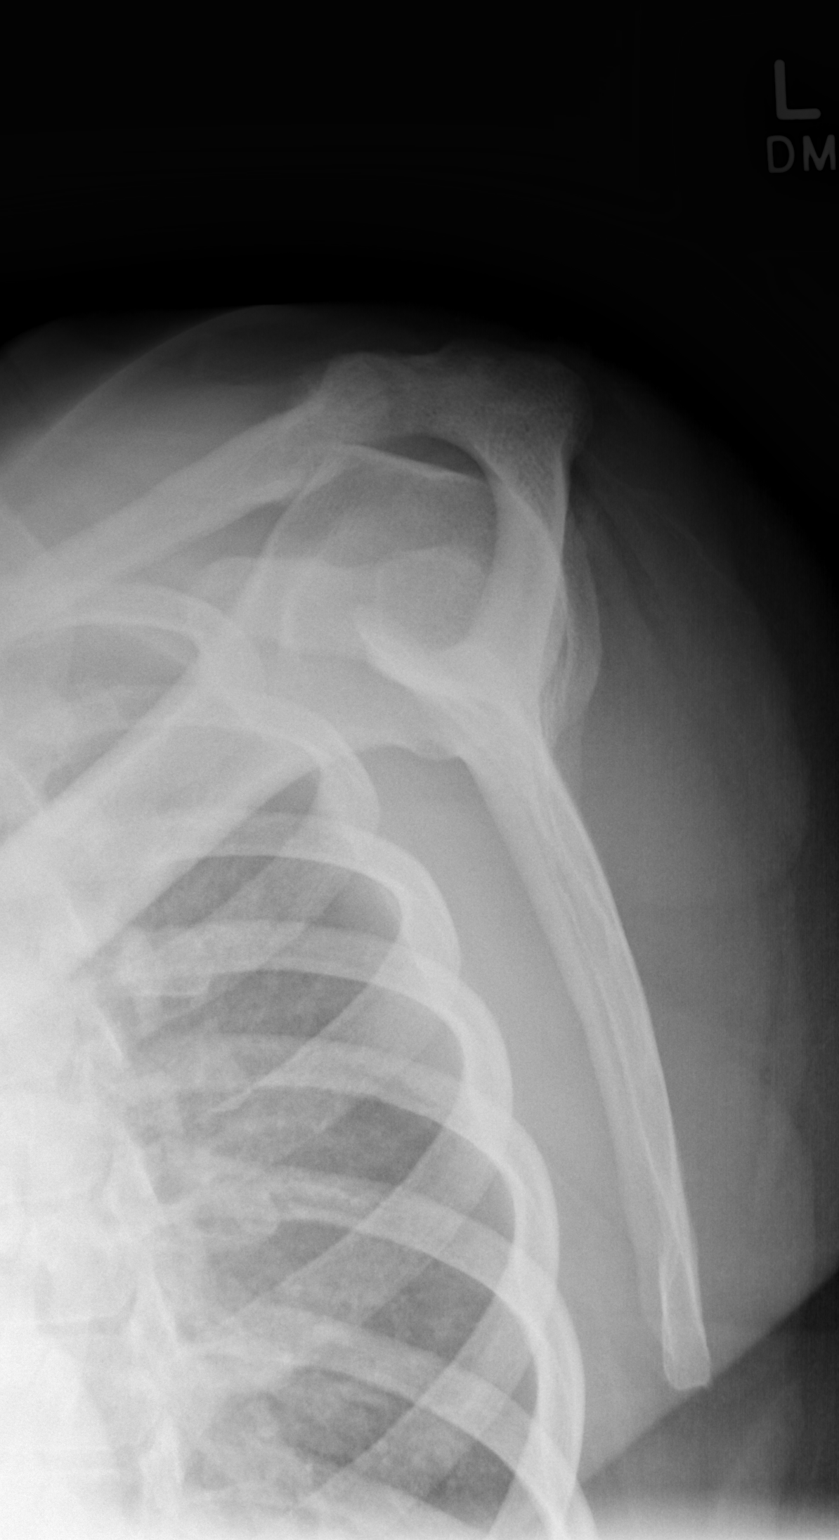

[x shoulder axillary left]
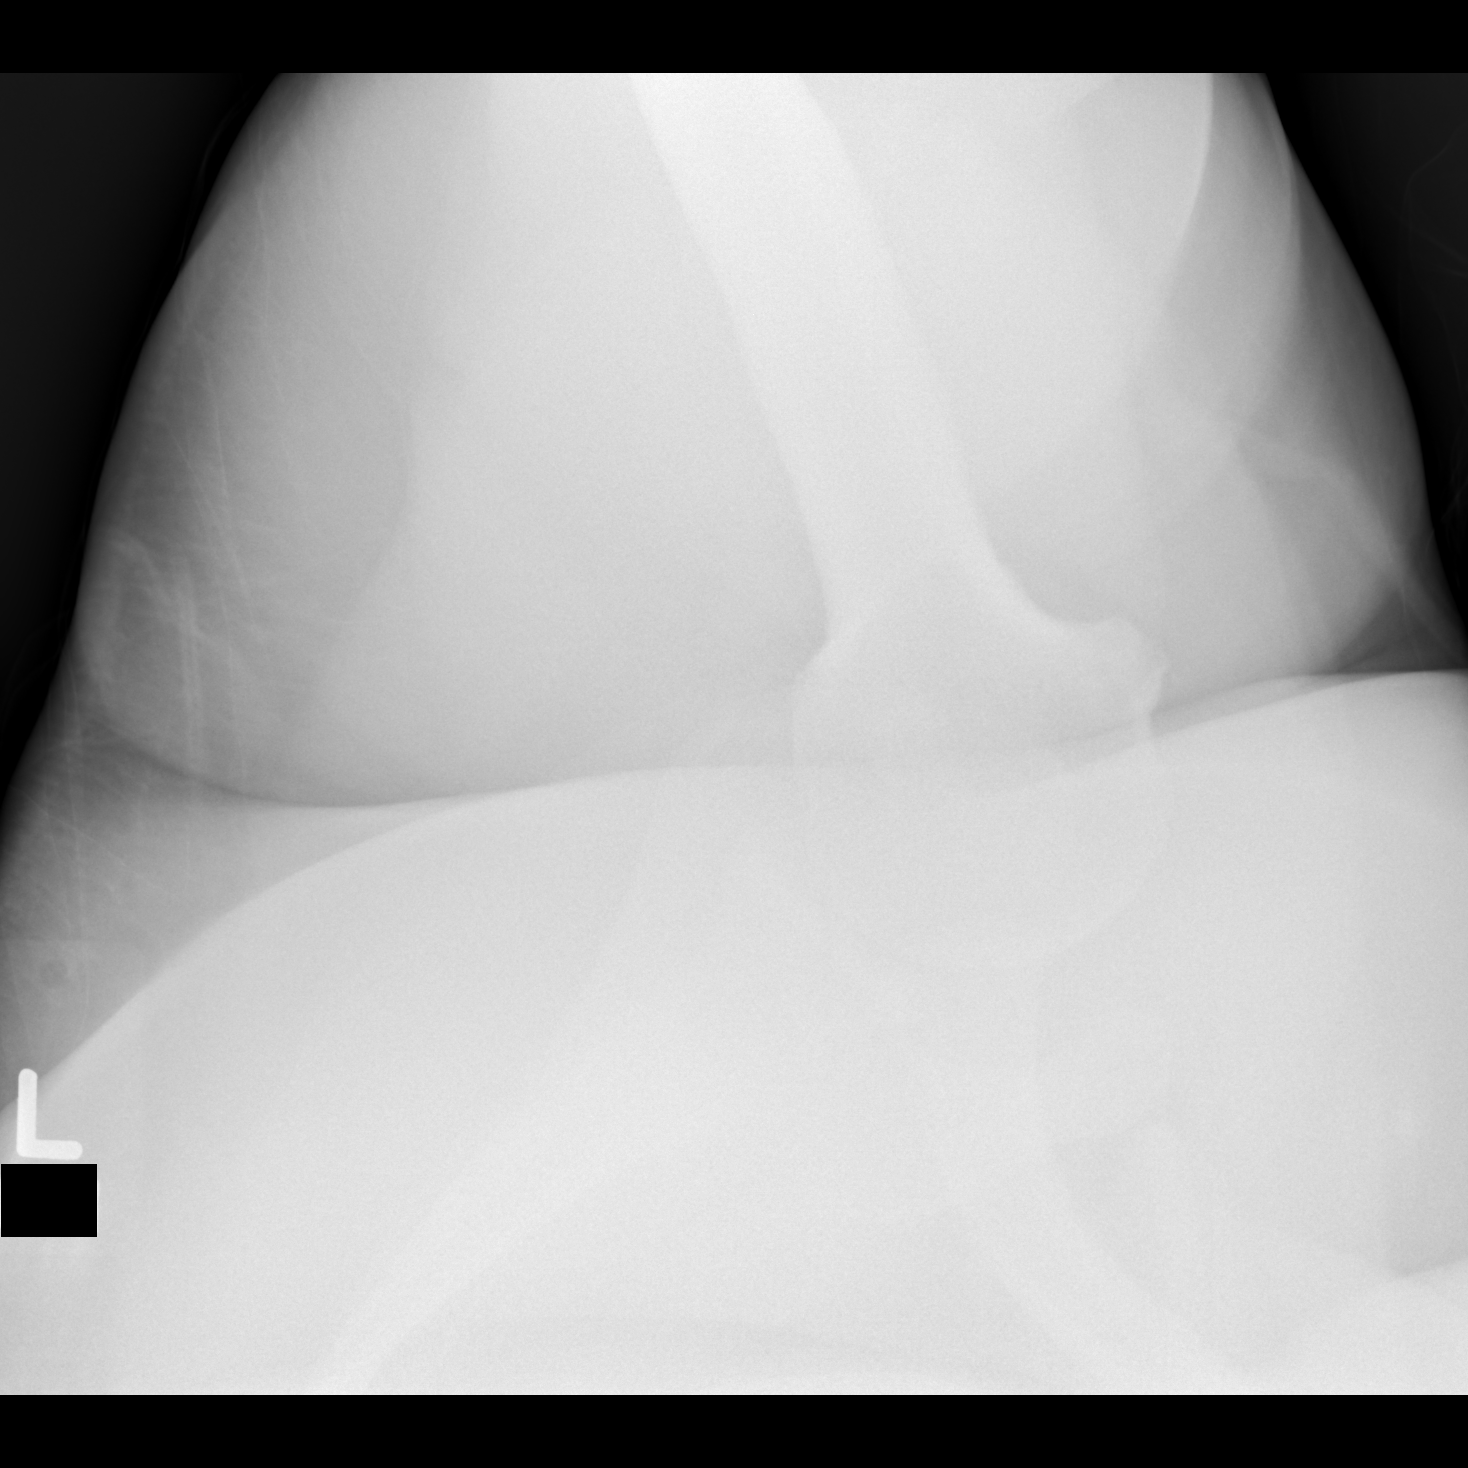

[3 of 3 positions shown; findings below may reference images not displayed]

FINDINGS: No acute fracture. No dislocation. Mild degenerative change of the
AC joint
IMPRESSION: No acute bony pathology

## 2013-12-02 IMAGING — CR DG CHEST 2V
2 series · 2 of 2 positions shown · non-contrast
Comparison: None

CLINICAL DATA: Left side chest pain for 1 week radiating to left
shoulder, history hypertension

EXAM:
CHEST  2 VIEW

[w chest pa]
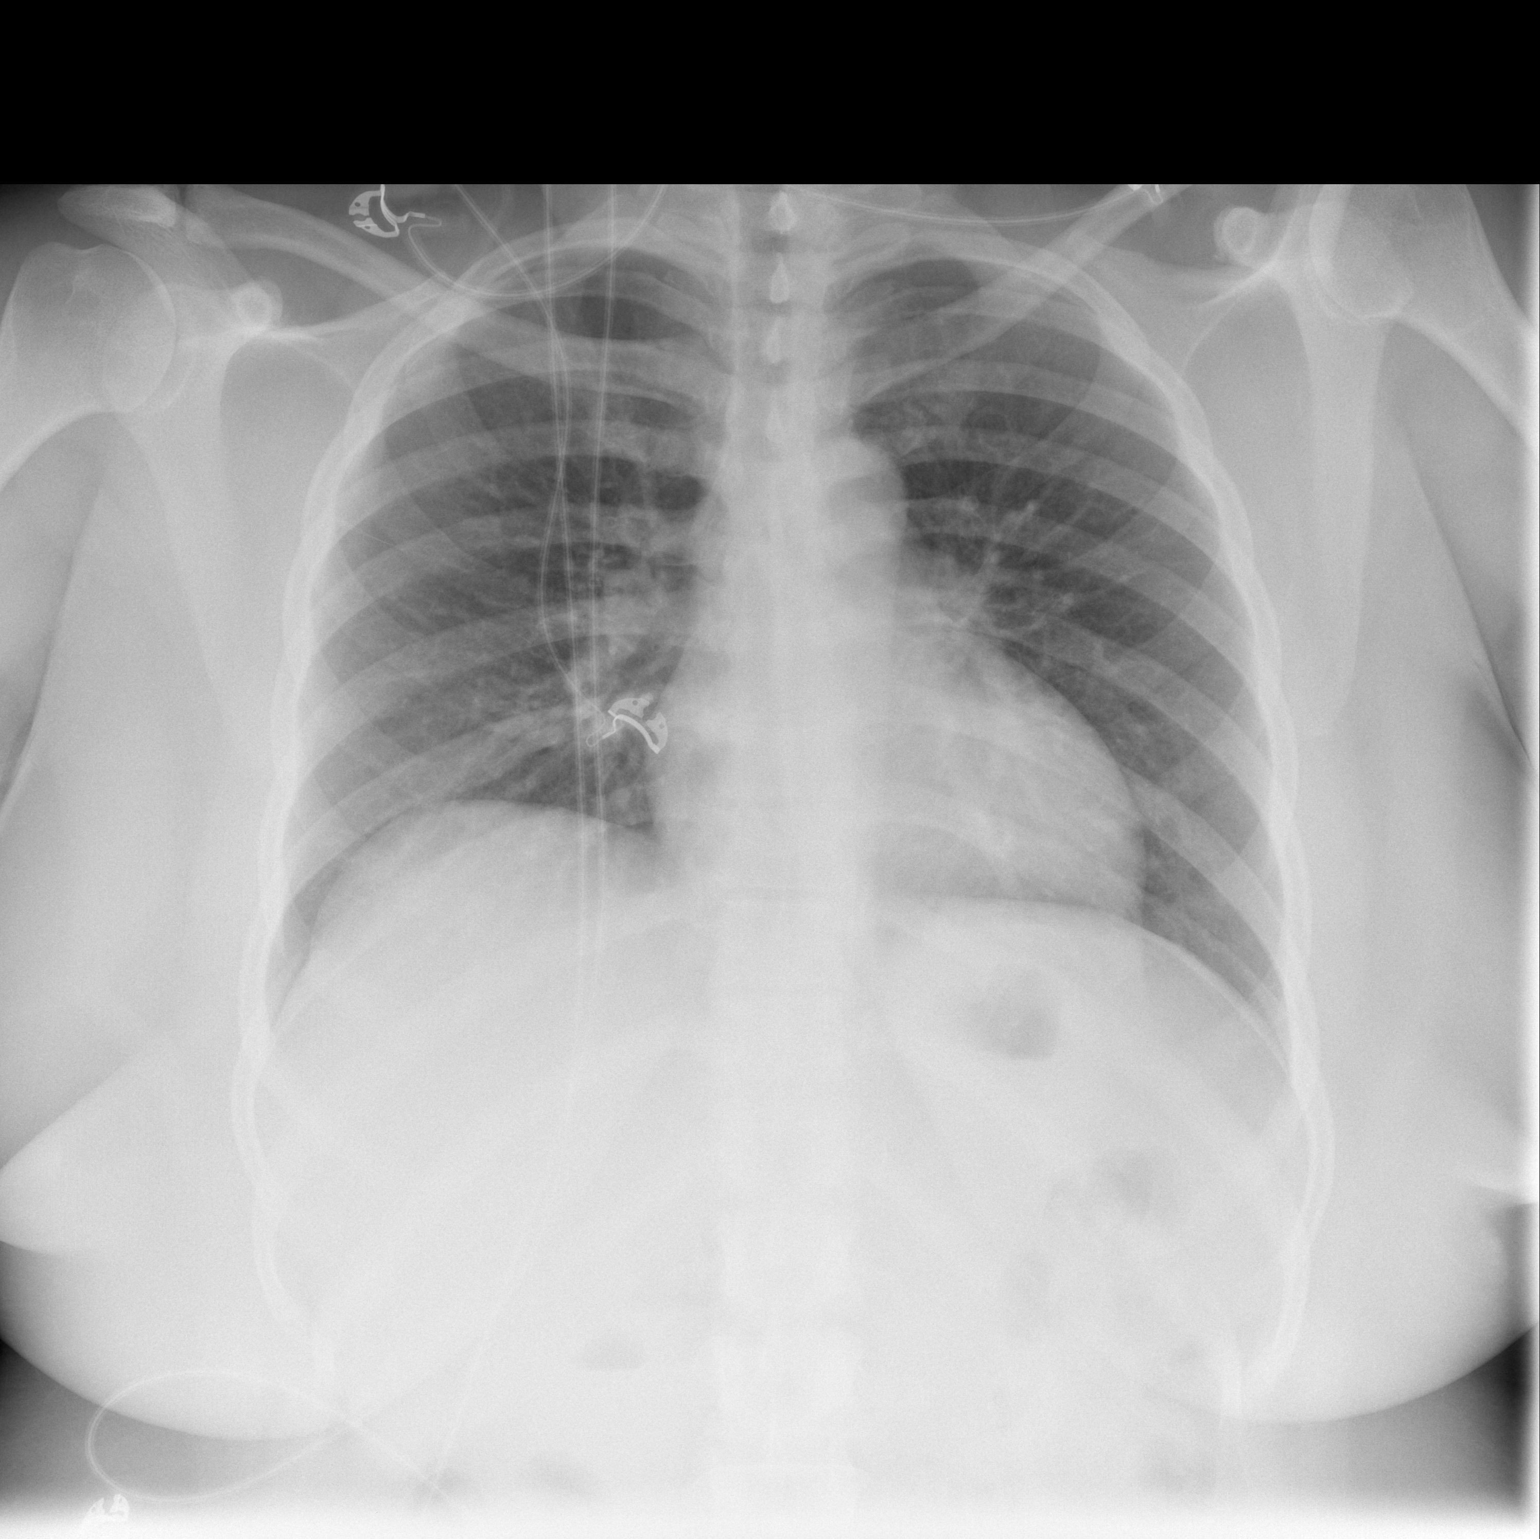

[w chest lat]
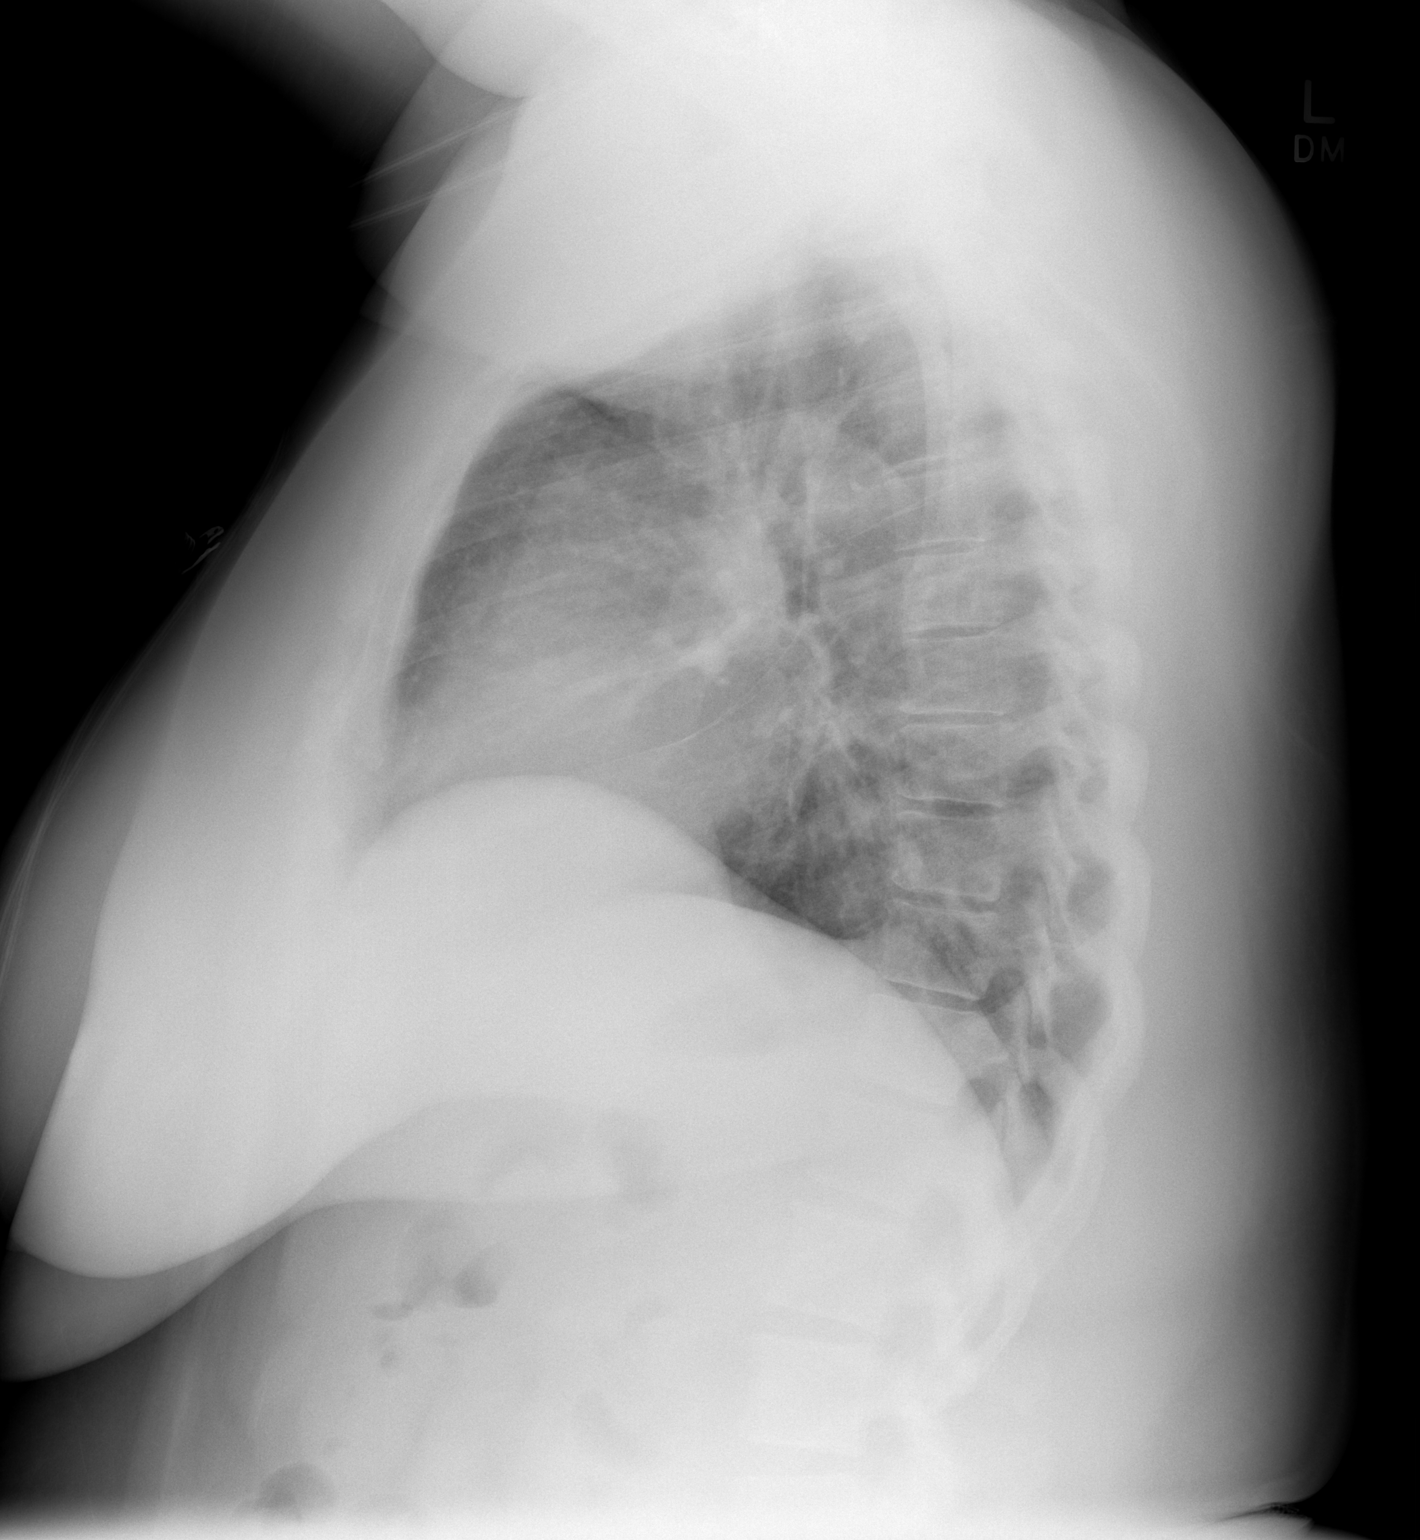

[2 of 2 positions shown; findings below may reference images not displayed]

FINDINGS: Upper-normal size of cardiac silhouette.

Mediastinal contours and pulmonary vascularity normal.

Lungs clear.

No pleural effusion or pneumothorax.

Bones unremarkable.
IMPRESSION: No acute abnormalities.

## 2013-12-02 MED ORDER — ASPIRIN 81 MG PO CHEW
324.0000 mg | CHEWABLE_TABLET | Freq: Once | ORAL | Status: AC
  Administered 2013-12-02: 324 mg via ORAL
  Filled 2013-12-02: qty 4

## 2013-12-02 MED ORDER — IBUPROFEN 600 MG PO TABS: 600.0000 mg | ORAL_TABLET | Freq: Three times a day (TID) | ORAL | Status: DC

## 2013-12-02 MED ORDER — HYDROCODONE-ACETAMINOPHEN 5-325 MG PO TABS: ORAL_TABLET | ORAL | Status: DC

## 2013-12-02 MED ORDER — KETOROLAC TROMETHAMINE 30 MG/ML IJ SOLN
30.0000 mg | Freq: Once | INTRAMUSCULAR | Status: AC
  Administered 2013-12-02: 30 mg via INTRAVENOUS
  Filled 2013-12-02: qty 1

## 2013-12-02 MED ORDER — MORPHINE SULFATE 4 MG/ML IJ SOLN
4.0000 mg | Freq: Once | INTRAMUSCULAR | Status: AC
  Administered 2013-12-02: 4 mg via INTRAVENOUS
  Filled 2013-12-02: qty 1

## 2013-12-02 NOTE — ED Provider Notes (Addendum)
CSN: [id number]     Arrival date & time 12/02/13  1528 History  This chart was scribed for Gavin Pound. Oletta Lamas, MD by Dorothey Baseman, ED Scribe. This patient was seen in room MH06/MH06 and the patient's care was started at 4:34 PM.    Chief Complaint  Patient presents with  . Shoulder Pain  . Chest Pain   The history is provided by the patient. No language interpreter was used.   HPI Comments: Sarah Randall is a 47 y.o. Female with a history of HTN who presents to the Emergency Department complaining of a sharp, waxing and waning pain to the left side of the chest that radiates into the left shoulder/upper arm onset about a week ago. She reports that her symptoms began with pain to the left shoulder and radiated into the left axilla, but has since moved to the chest region. Patient states that her pain is exacerbated with movement. She states that these symptoms are new for her. She reports taking ibuprofen at home and applying heat to the affected areas without significant relief. Patient reports that she lifts and moves somewhat heavy objects frequently/repetitively at her work. She denies excess burping or flatulence, cough, shortness of breath, back pain, or chest pain that is exacerbated with deep breathing or cough. Patient is left-handed. She denies any recent extended travel. She denies familial history of MI. Patient is a current, some day smoker. Patient denies any allergies to medications.   Past Medical History  Diagnosis Date  . Hypertension    Past Surgical History  Procedure Laterality Date  . Tubal ligation     History reviewed. No pertinent family history. History  Substance Use Topics  . Smoking status: Current Some Day Smoker  . Smokeless tobacco: Not on file  . Alcohol Use: Yes   OB History   Grav Para Term Preterm Abortions TAB SAB Ect Mult Living                 Review of Systems  A complete 10 system review of systems was obtained and all systems are negative  except as noted in the HPI and PMH.   Allergies  Review of patient's allergies indicates no known allergies.  Home Medications   Current Outpatient Rx  Name  Route  Sig  Dispense  Refill  . candesartan (ATACAND) 16 MG tablet   Oral   Take 16 mg by mouth daily.         . hydrochlorothiazide (HYDRODIURIL) 25 MG tablet   Oral   Take 25 mg by mouth daily.         Marland Kitchen HYDROcodone-acetaminophen (NORCO/VICODIN) 5-325 MG per tablet      1-2 tablets po q 6 hours prn moderate to severe pain   15 tablet   0   . ibuprofen (ADVIL,MOTRIN) 600 MG tablet   Oral   Take 1 tablet (600 mg total) by mouth 3 (three) times daily.   21 tablet   0    Triage Vitals: BP 128/78  Pulse 54  Temp(Src) 98.6 F (37 C) (Oral)  Resp 16  Ht 5\' 4"  (1.626 m)  Wt 267 lb (121.11 kg)  BMI 45.81 kg/m2  SpO2 100%  LMP 12/02/2013  Physical Exam  Nursing note and vitals reviewed. Constitutional: She is oriented to person, place, and time. She appears well-developed and well-nourished. No distress.  HENT:  Head: Normocephalic and atraumatic.  Eyes: Conjunctivae are normal.  Neck: Normal range of motion. Neck supple.  Cardiovascular: Normal rate, regular rhythm, normal heart sounds and intact distal pulses.   Brisk capillary refill.   Pulmonary/Chest: Effort normal and breath sounds normal. No respiratory distress.  Abdominal: She exhibits no distension.  Musculoskeletal: Normal range of motion.  Tenderness to palpation along the shoulder/tricep/bicep area.  Neurological: She is alert and oriented to person, place, and time.  Normal strength.   Skin: Skin is warm and dry.  Psychiatric: She has a normal mood and affect. Her behavior is normal.    ED Course  Procedures (including critical care time)   DIAGNOSTIC STUDIES: Oxygen Saturation is 100% on room air, normal by my interpretation.    COORDINATION OF CARE: 4:39 PM- Discussed that symptoms may be joint or nerve related. Ordered an EKG and  blood labs to rule out possible cardiac problems. Ordered aspirin to manage symptoms. Will order x-rays of the chest and left shoulder. Will order Toradol and morphine to manage symptoms. Discussed treatment plan with patient at bedside and patient verbalized agreement.     Labs Review Labs Reviewed  BASIC METABOLIC PANEL - Abnormal; Notable for the following:    BUN 24 (*)    GFR calc non Af Amer 66 (*)    GFR calc Af Amer 77 (*)    All other components within normal limits  CBC - Abnormal; Notable for the following:    WBC 12.9 (*)    All other components within normal limits  TROPONIN I   Imaging Review Dg Chest 2 View  12/02/2013   CLINICAL DATA:  Left side chest pain for 1 week radiating to left shoulder, history hypertension  EXAM: CHEST  2 VIEW  COMPARISON:  None  FINDINGS: Upper-normal size of cardiac silhouette.  Mediastinal contours and pulmonary vascularity normal.  Lungs clear.  No pleural effusion or pneumothorax.  Bones unremarkable.  IMPRESSION: No acute abnormalities.   Electronically Signed   By: Ulyses Southward M.D.   On: 12/02/2013 17:30   Dg Shoulder Left  12/02/2013   CLINICAL DATA:  Left shoulder pain  EXAM: LEFT SHOULDER - 2+ VIEW  COMPARISON:  08/14/2012  FINDINGS: No acute fracture. No dislocation. Mild degenerative change of the [facility] joint  IMPRESSION: No acute bony pathology   Electronically Signed   By: Maryclare Bean M.D.   On: 12/02/2013 17:30    EKG Interpretation    Date/Time:  Saturday December 02 2013 15:41:30 EST Ventricular Rate:  89 PR Interval:  156 QRS Duration: 72 QT Interval:  362 QTC Calculation: 440 R Axis:   18 Text Interpretation:  Normal sinus rhythm Possible Anterior infarct , age undetermined Abnormal ECG Poor R wave progression No previous tracing Confirmed by [facility]  MD, MICHEAL ([id number]) on 12/02/2013 4:32:48 PM           6:02 PM Pt felt improved after IV meds MDM   1. Shoulder pain, acute, left    I personally performed the  services described in this documentation, which was scribed in my presence. The recorded information has been reviewed and considered.   Pt with atypical CP, mostly left shoulder pain, worse with movement and exertion of left shoulder and arm.  Pt is left handed, works Pharmacologist, pots and pans all day.  No SOB, nausea, sweats, no exertional CP or tightness with cardiovascular involvement.  No sig family h/o CAD at early age.  Doubt this is cardiac.  Will treat as musculoskeletal and refer to Dr. Pearletha Forge for recheck in 1 week.  Gavin Pound. Oletta Lamas, MD 12/02/13 Flossie Buffy  Gavin Pound. Oletta Lamas, MD 12/02/13 1610

## 2013-12-02 NOTE — ED Notes (Signed)
Pt having left sided chest pain radiating to left shoulder/arm.  Pt states she has some tingling down left arm.

## 2013-12-02 NOTE — Discharge Instructions (Signed)
Arthralgia °Your caregiver has diagnosed you as suffering from an arthralgia. Arthralgia means there is pain in a joint. This can come from many reasons including: °· Bruising the joint which causes soreness (inflammation) in the joint. °· Wear and tear on the joints which occur as we grow older (osteoarthritis). °· Overusing the joint. °· Various forms of arthritis. °· Infections of the joint. °Regardless of the cause of pain in your joint, most of these different pains respond to anti-inflammatory drugs and rest. The exception to this is when a joint is infected, and these cases are treated with antibiotics, if it is a bacterial infection. °HOME CARE INSTRUCTIONS  °· Rest the injured area for as long as directed by your caregiver. Then slowly start using the joint as directed by your caregiver and as the pain allows. Crutches as directed may be useful if the ankles, knees or hips are involved. If the knee was splinted or casted, continue use and care as directed. If an stretchy or elastic wrapping bandage has been applied today, it should be removed and re-applied every 3 to 4 hours. It should not be applied tightly, but firmly enough to keep swelling down. Watch toes and feet for swelling, bluish discoloration, coldness, numbness or excessive pain. If any of these problems (symptoms) occur, remove the ace bandage and re-apply more loosely. If these symptoms persist, contact your caregiver or return to this location. °· For the first 24 hours, keep the injured extremity elevated on pillows while lying down. °· Apply ice for 15-20 minutes to the sore joint every couple hours while awake for the first half day. Then 03-04 times per day for the first 48 hours. Put the ice in a plastic bag and place a towel between the bag of ice and your skin. °· Wear any splinting, casting, elastic bandage applications, or slings as instructed. °· Only take over-the-counter or prescription medicines for pain, discomfort, or fever as  directed by your caregiver. Do not use aspirin immediately after the injury unless instructed by your physician. Aspirin can cause increased bleeding and bruising of the tissues. °· If you were given crutches, continue to use them as instructed and do not resume weight bearing on the sore joint until instructed. °Persistent pain and inability to use the sore joint as directed for more than 2 to 3 days are warning signs indicating that you should see a caregiver for a follow-up visit as soon as possible. Initially, a hairline fracture (break in bone) may not be evident on X-rays. Persistent pain and swelling indicate that further evaluation, non-weight bearing or use of the joint (use of crutches or slings as instructed), or further X-rays are indicated. X-rays may sometimes not show a small fracture until a week or 10 days later. Make a follow-up appointment with your own caregiver or one to whom we have referred you. A radiologist (specialist in reading X-rays) may read your X-rays. Make sure you know how you are to obtain your X-ray results. Do not assume everything is normal if you do not hear from us. °SEEK MEDICAL CARE IF: °Bruising, swelling, or pain increases. °SEEK IMMEDIATE MEDICAL CARE IF:  °· Your fingers or toes are numb or blue. °· The pain is not responding to medications and continues to stay the same or get worse. °· The pain in your joint becomes severe. °· You develop a fever over 102° F (38.9° C). °· It becomes impossible to move or use the joint. °MAKE SURE YOU:  °·   Understand these instructions. °· Will watch your condition. °· Will get help right away if you are not doing well or get worse. °Document Released: 11/23/2005 Document Revised: 02/15/2012 Document Reviewed: 07/11/2008 °[employer]® Patient Information ©2014 [employer]. ° ° ° °Narcotic and benzodiazepine use may cause drowsiness, slowed breathing or dependence.  Please use with caution and do not drive, operate machinery or watch  young children alone while taking them.  Taking combinations of these medications or drinking alcohol will potentiate these effects.   ° °

## 2014-03-17 ENCOUNTER — Emergency Department (HOSPITAL_BASED_OUTPATIENT_CLINIC_OR_DEPARTMENT_OTHER)
Admission: EM | Admit: 2014-03-17 | Discharge: 2014-03-17 | Disposition: A | Discharge status: Home / Self Care | Payer: Self-pay | Attending: Emergency Medicine | Admitting: Emergency Medicine

## 2014-03-17 ENCOUNTER — Emergency Department (HOSPITAL_BASED_OUTPATIENT_CLINIC_OR_DEPARTMENT_OTHER): Payer: Self-pay

## 2014-03-17 ENCOUNTER — Encounter (HOSPITAL_BASED_OUTPATIENT_CLINIC_OR_DEPARTMENT_OTHER): Payer: Self-pay | Admitting: Emergency Medicine

## 2014-03-17 DIAGNOSIS — Z9851 Tubal ligation status: Secondary | ICD-10-CM | POA: Insufficient documentation

## 2014-03-17 DIAGNOSIS — Z791 Long term (current) use of non-steroidal anti-inflammatories (NSAID): Secondary | ICD-10-CM | POA: Insufficient documentation

## 2014-03-17 DIAGNOSIS — R1012 Left upper quadrant pain: Secondary | ICD-10-CM | POA: Insufficient documentation

## 2014-03-17 DIAGNOSIS — R1011 Right upper quadrant pain: Secondary | ICD-10-CM | POA: Insufficient documentation

## 2014-03-17 DIAGNOSIS — N390 Urinary tract infection, site not specified: Secondary | ICD-10-CM

## 2014-03-17 DIAGNOSIS — I10 Essential (primary) hypertension: Secondary | ICD-10-CM | POA: Insufficient documentation

## 2014-03-17 DIAGNOSIS — R109 Unspecified abdominal pain: Secondary | ICD-10-CM

## 2014-03-17 DIAGNOSIS — E119 Type 2 diabetes mellitus without complications: Secondary | ICD-10-CM | POA: Insufficient documentation

## 2014-03-17 DIAGNOSIS — D72829 Elevated white blood cell count, unspecified: Secondary | ICD-10-CM

## 2014-03-17 DIAGNOSIS — Z3202 Encounter for pregnancy test, result negative: Secondary | ICD-10-CM | POA: Insufficient documentation

## 2014-03-17 DIAGNOSIS — Z79899 Other long term (current) drug therapy: Secondary | ICD-10-CM | POA: Insufficient documentation

## 2014-03-17 HISTORY — DX: Type 2 diabetes mellitus without complications: E11.9

## 2014-03-17 LAB — URINALYSIS, ROUTINE W REFLEX MICROSCOPIC
Bilirubin Urine: NEGATIVE
Glucose, UA: NEGATIVE mg/dL
Ketones, ur: NEGATIVE mg/dL
Nitrite: NEGATIVE
Protein, ur: 30 mg/dL — AB
Specific Gravity, Urine: 1.026 (ref 1.005–1.030)
Urobilinogen, UA: 0.2 mg/dL (ref 0.0–1.0)
pH: 5.5 (ref 5.0–8.0)

## 2014-03-17 LAB — URINE MICROSCOPIC-ADD ON

## 2014-03-17 LAB — COMPREHENSIVE METABOLIC PANEL
ALT: 15 U/L (ref 0–35)
AST: 23 U/L (ref 0–37)
Albumin: 4.3 g/dL (ref 3.5–5.2)
Alkaline Phosphatase: 75 U/L (ref 39–117)
BUN: 17 mg/dL (ref 6–23)
CO2: 22 mEq/L (ref 19–32)
Calcium: 9.7 mg/dL (ref 8.4–10.5)
Chloride: 98 mEq/L (ref 96–112)
Creatinine, Ser: 1.1 mg/dL (ref 0.50–1.10)
GFR calc Af Amer: 68 mL/min — ABNORMAL LOW (ref >90)
GFR calc non Af Amer: 59 mL/min — ABNORMAL LOW (ref >90)
Glucose, Bld: 102 mg/dL — ABNORMAL HIGH (ref 70–99)
Potassium: 4 mEq/L (ref 3.7–5.3)
Sodium: 137 mEq/L (ref 137–147)
Total Bilirubin: <0.2 mg/dL — ABNORMAL LOW (ref 0.3–1.2)
Total Protein: 8 g/dL (ref 6.0–8.3)

## 2014-03-17 LAB — CBC
HCT: 41.6 % (ref 36.0–46.0)
Hemoglobin: 14.3 g/dL (ref 12.0–15.0)
MCH: 32.1 pg (ref 26.0–34.0)
MCHC: 34.4 g/dL (ref 30.0–36.0)
MCV: 93.5 fL (ref 78.0–100.0)
Platelets: 371 10*3/uL (ref 150–400)
RBC: 4.45 MIL/uL (ref 3.87–5.11)
RDW: 13.4 % (ref 11.5–15.5)
WBC: 16 10*3/uL — ABNORMAL HIGH (ref 4.0–10.5)

## 2014-03-17 LAB — LIPASE, BLOOD: Lipase: 39 U/L (ref 11–59)

## 2014-03-17 LAB — PREGNANCY, URINE: Preg Test, Ur: NEGATIVE

## 2014-03-17 IMAGING — CR DG CHEST 2V
2 series · 2 of 2 positions shown · non-contrast
Comparison: DG CHEST 2 VIEW dated [DATE]

CLINICAL DATA: Upper abdominal pain began 2 days ago, worse in the
left upper abdomen

EXAM:
CHEST  2 VIEW

[w chest lat]
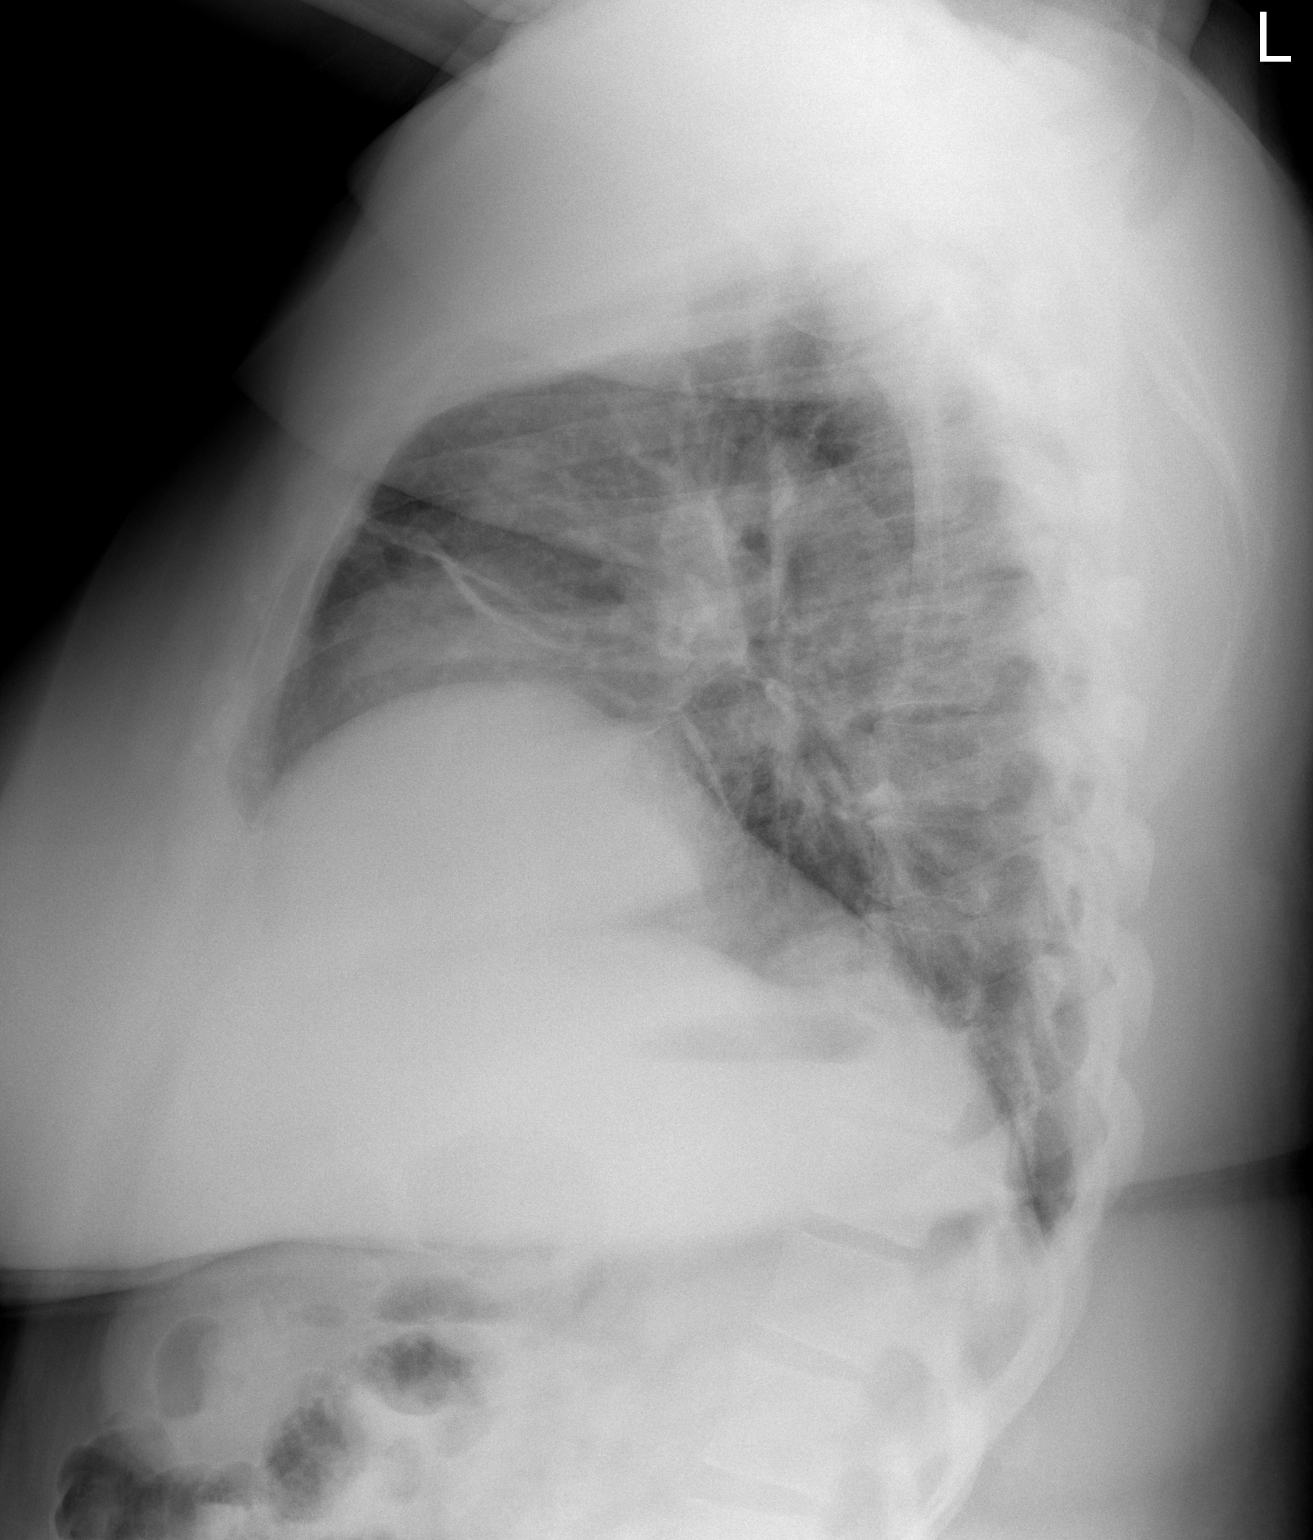

[w chest pa]
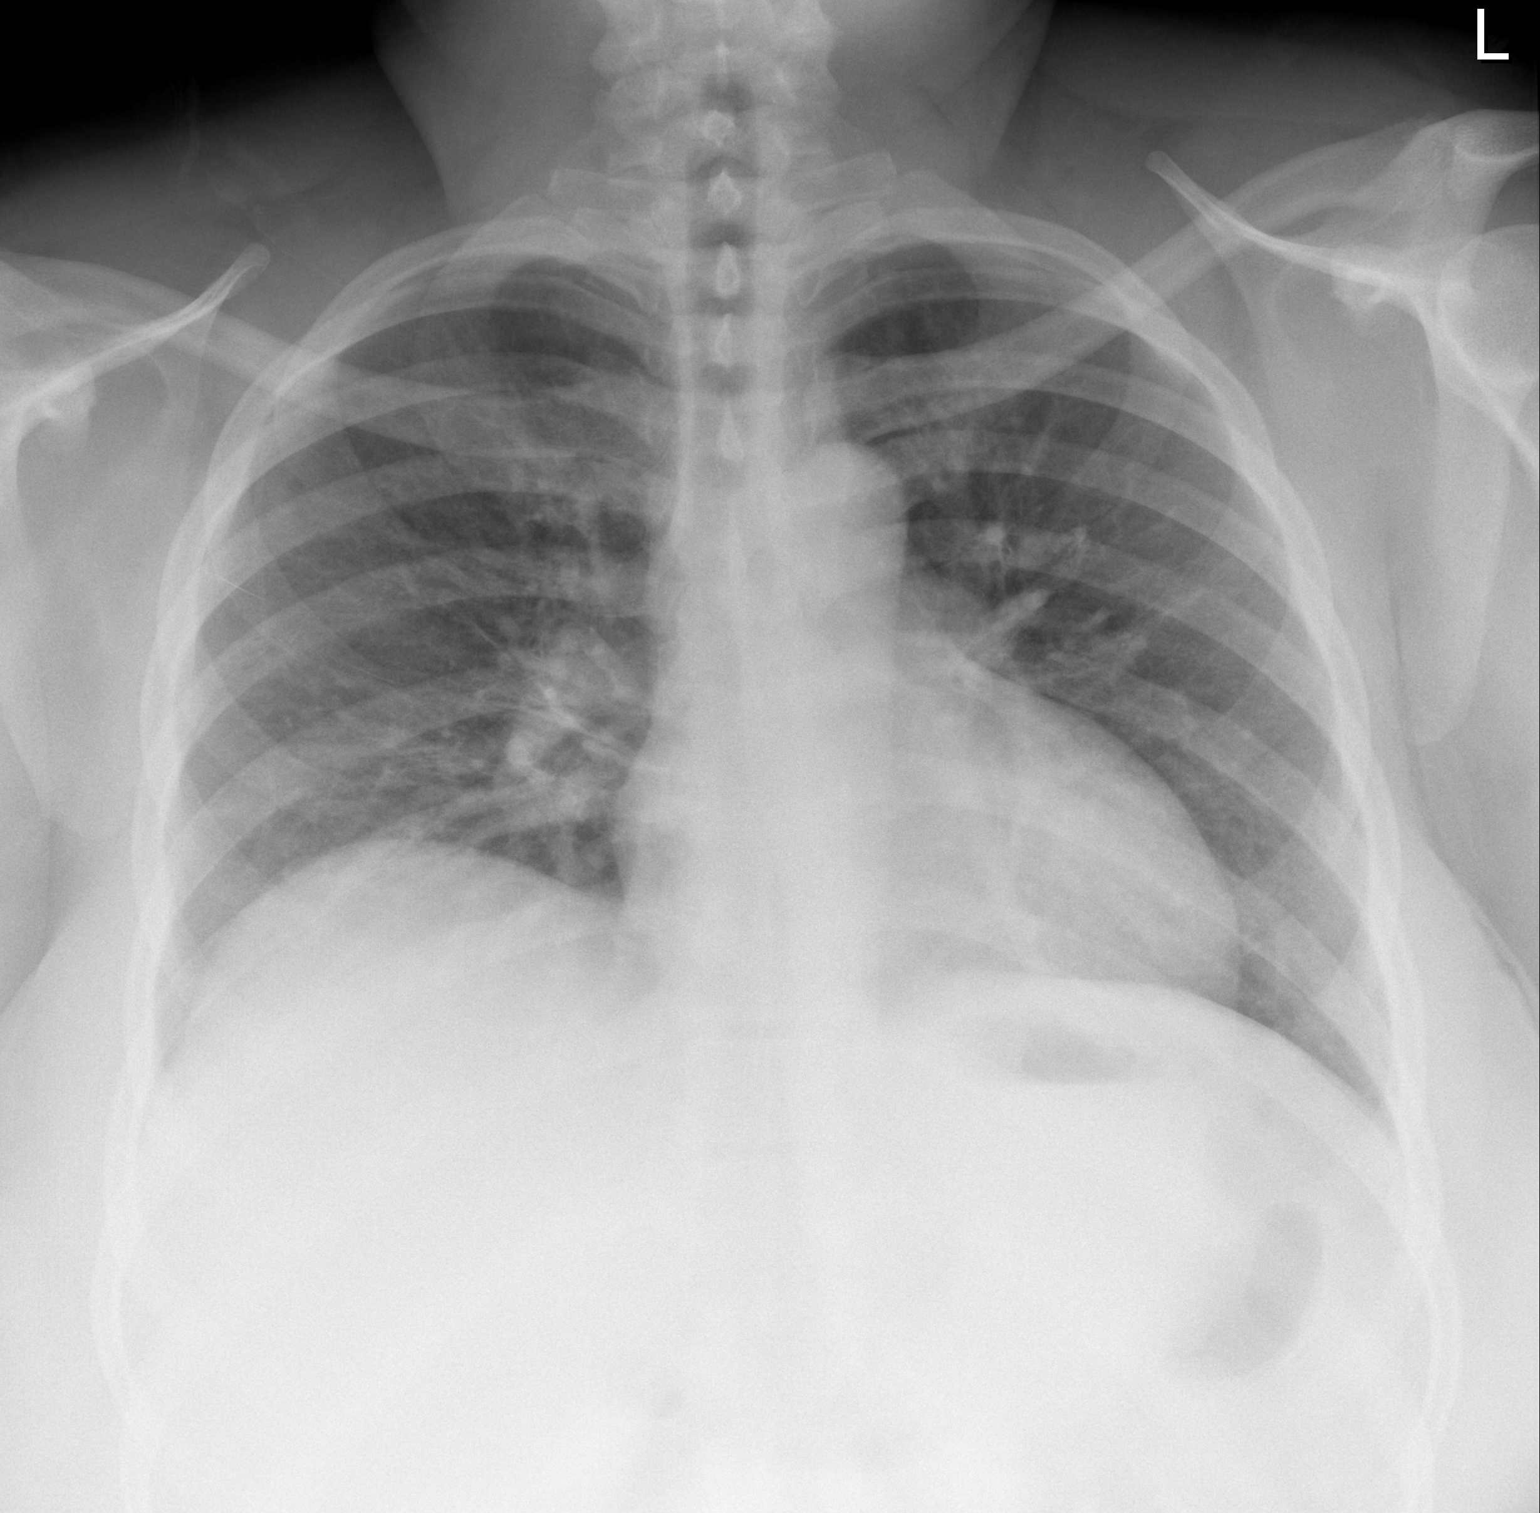

[2 of 2 positions shown; findings below may reference images not displayed]

FINDINGS: Heart size is upper normal likely exaggerated by a limited
inspiratory effect. Mild elevation of the right diaphragm similar to
prior study. Lungs are clear except for mild subsegmental
atelectasis in the right middle and lower lobe. No pleural
effusions.
IMPRESSION: No significant acute cardiopulmonary findings.

## 2014-03-17 IMAGING — CT CT ABD-PELV W/ CM
2 of 5 series · 17 of 46 positions shown, 19 images · IV contrast (APPLIED)
Comparison: None.

CLINICAL DATA: Abdominal pain

EXAM:
CT ABDOMEN AND PELVIS WITH CONTRAST
TECHNIQUE: Multidetector CT imaging of the abdomen and pelvis was performed
using the standard protocol following bolus administration of
intravenous contrast.
CONTRAST:  50mL OMNIPAQUE IOHEXOL 300 MG/ML SOLN, 100mL OMNIPAQUE
IOHEXOL 300 MG/ML SOLN

[Series 2: abd/pelvis 5.0 b31f · axial · 0.76mm/px · z∈[+642,+1067]mm · 14 of 97 slices shown, 16 images]
[im 6/97  soft-tissue]
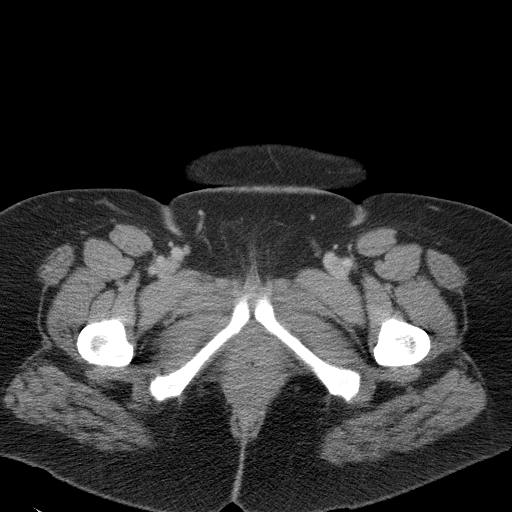
[im 6/97  bone]
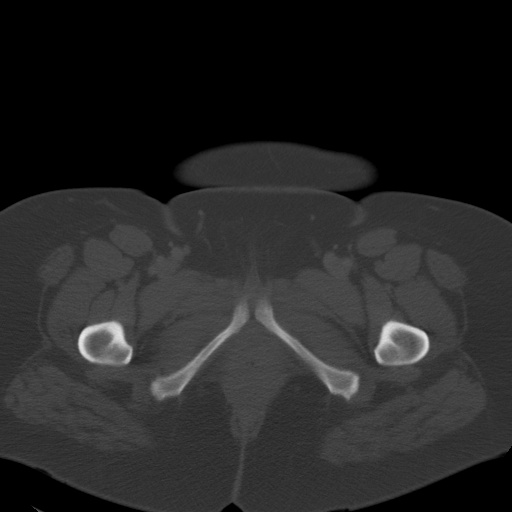
[im 11/97  soft-tissue]
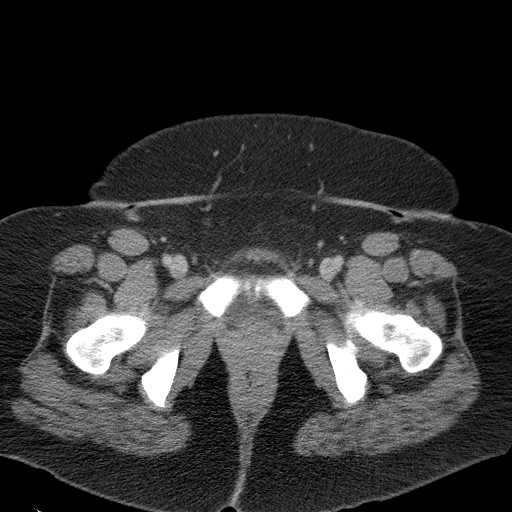
[im 21/97  soft-tissue]
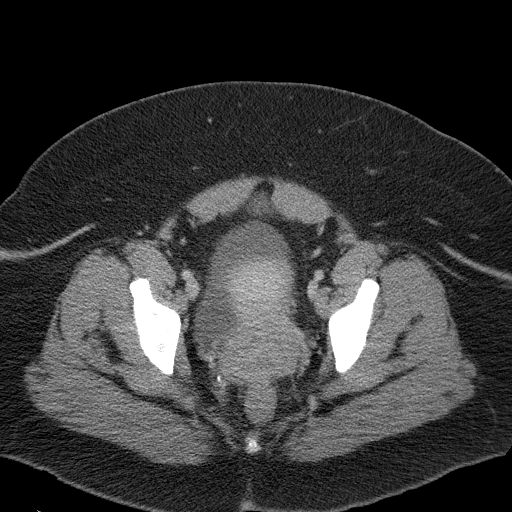
[im 26/97  soft-tissue]
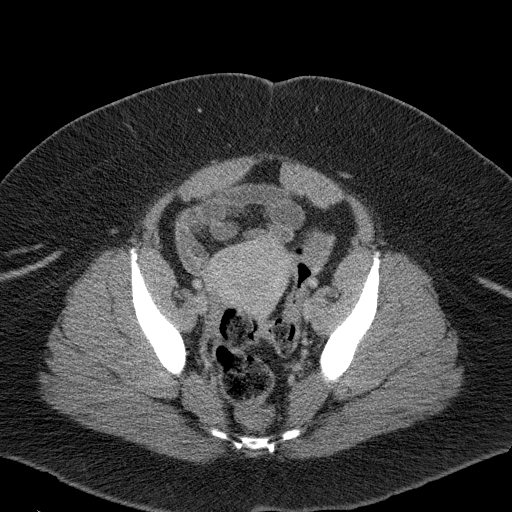
[im 31/97  soft-tissue]
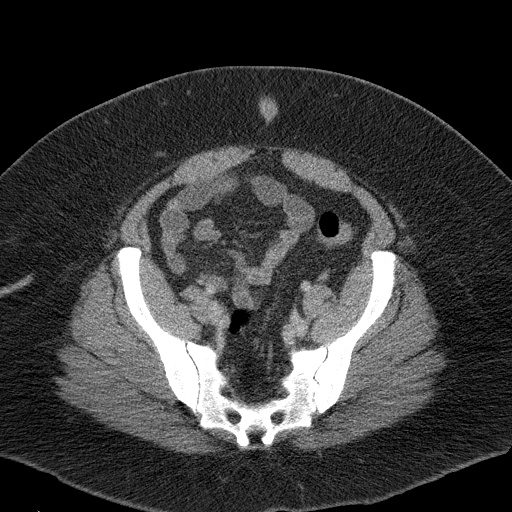
[im 41/97  soft-tissue]
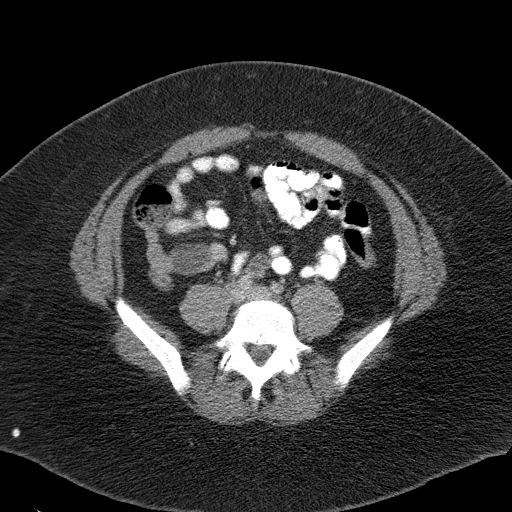
[im 46/97  soft-tissue]
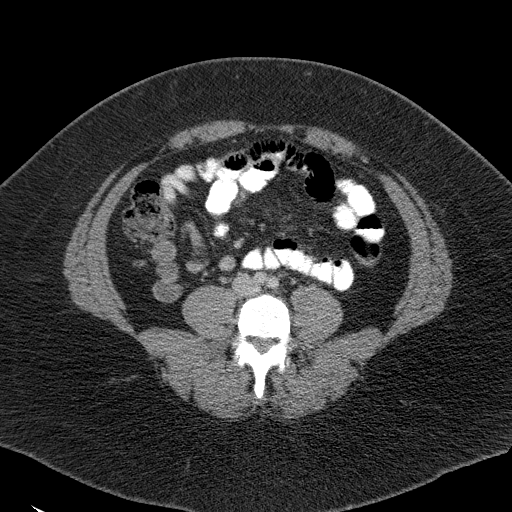
[im 51/97  soft-tissue]
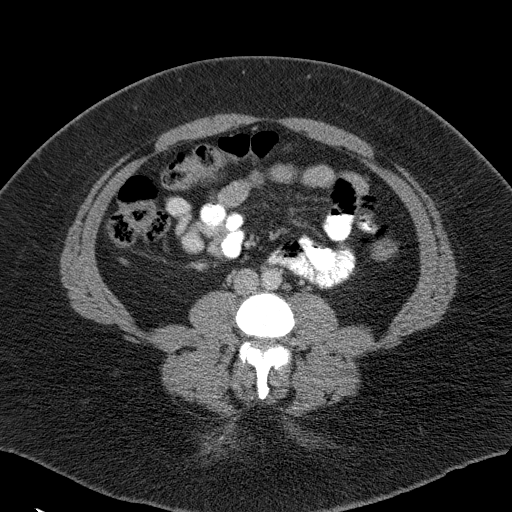
[im 56/97  soft-tissue]
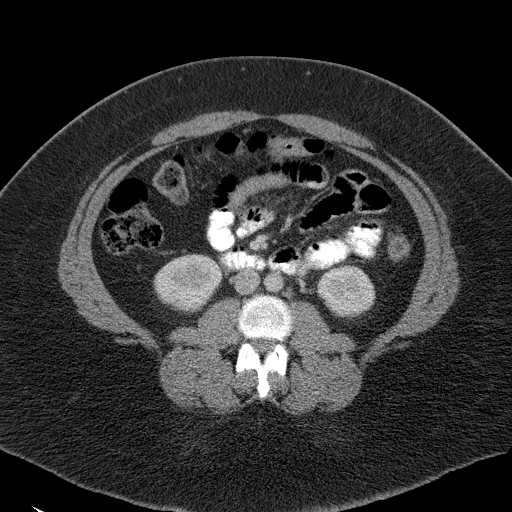
[im 56/97  bone]
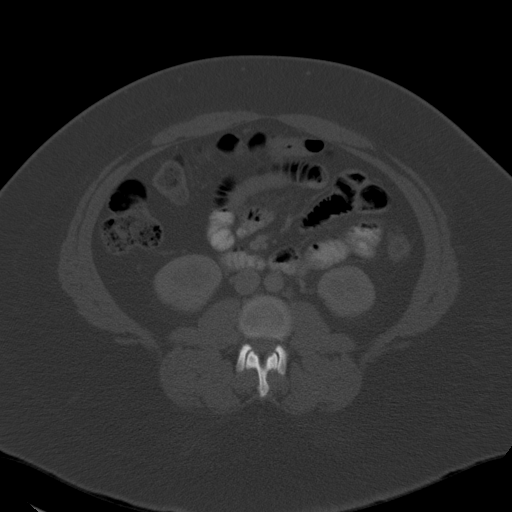
[im 66/97  soft-tissue]
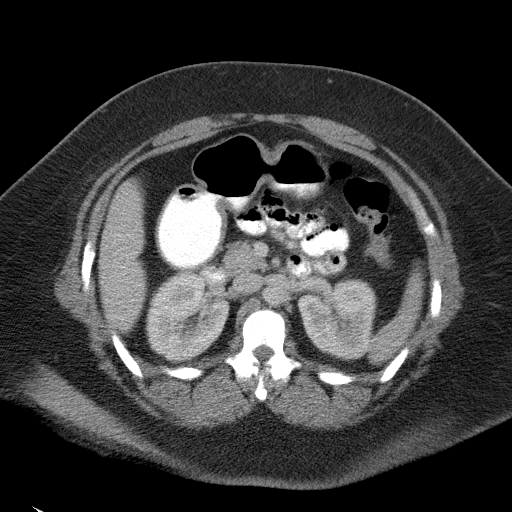
[im 71/97  soft-tissue]
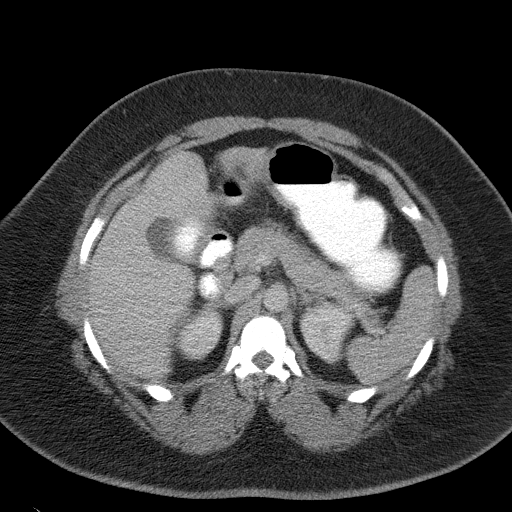
[im 76/97  soft-tissue]
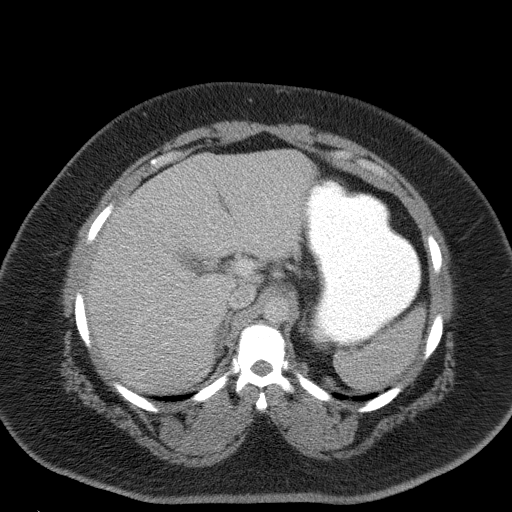
[im 86/97  soft-tissue]
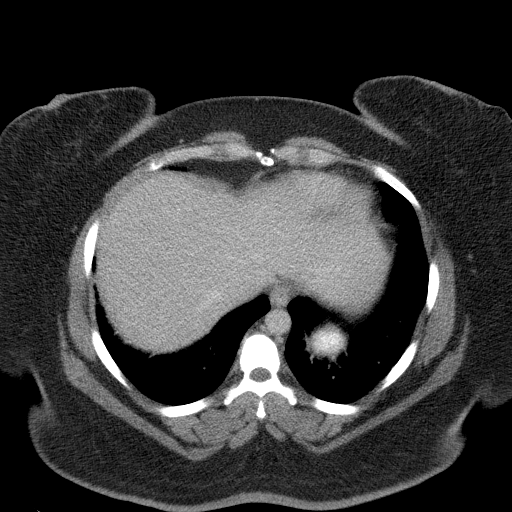
[im 91/97  soft-tissue]
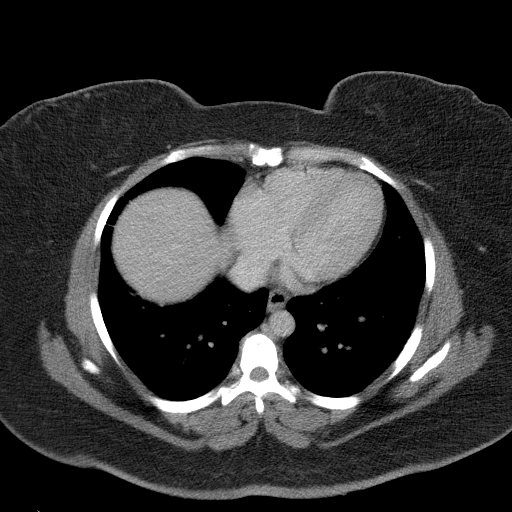

[Series 5: abd/pelvis 3.0 coronal · coronal · 0.97mm/px · 3 of 108 slices shown]
[im 36/108  soft-tissue]
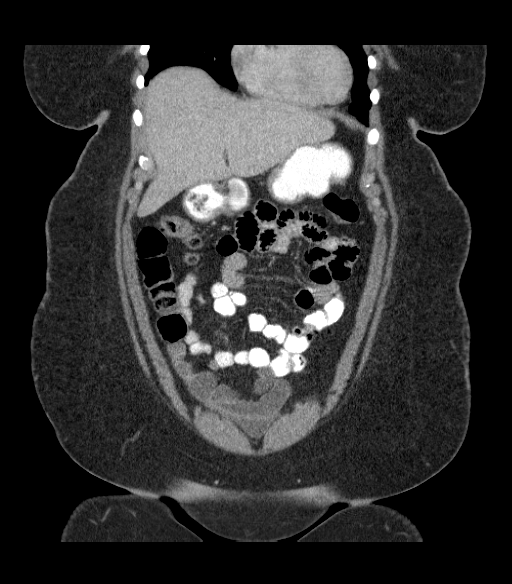
[im 48/108  soft-tissue]
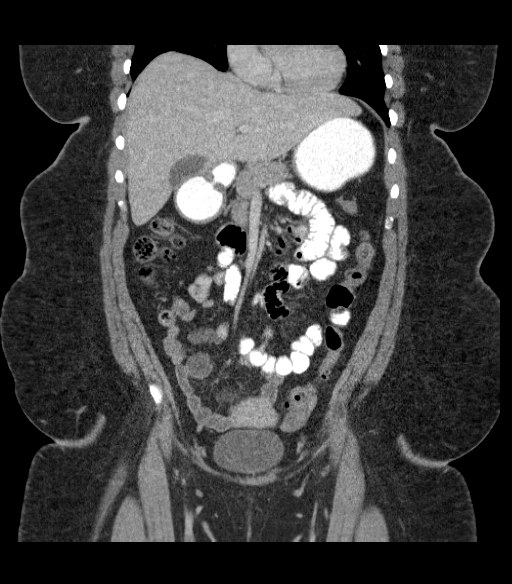
[im 60/108  soft-tissue]
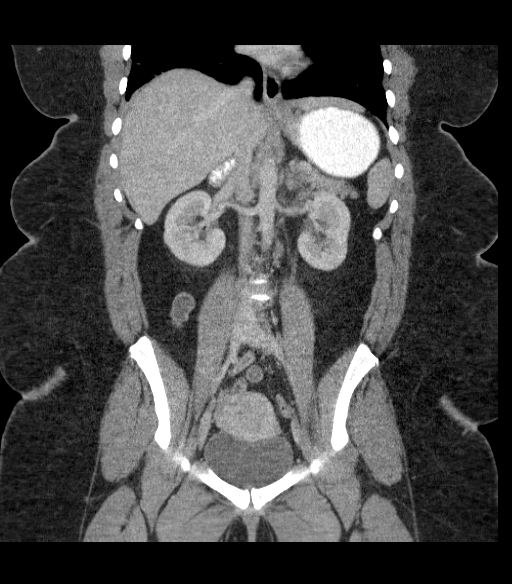

[17 of 46 positions shown; findings below may reference images not displayed]

FINDINGS: Minimal hypoventilation left lung base; lung bases otherwise
unremarkable.

The liver, spleen, adrenals, pancreas, kidneys are unremarkable.

The bowel is negative.  The appendix is identified is unremarkable.

No abdominal nor pelvic masses, free fluid, loculated fluid
collections, nor adenopathy.

There is no evidence of abdominal aortic aneurysm. The celiac, SMA,
IMA, portal vein, SMV are opacified.

A very small fat containing umbilical hernia is appreciated. There
is no evidence of an inguinal hernia.

The no aggressive appearing osseous lesions.
IMPRESSION: No evidence of focal or acute abnormalities. Small fat containing
umbilical hernia.No CT evidence accounting for the patient's
clinical presentation.

## 2014-03-17 MED ORDER — PANTOPRAZOLE SODIUM 20 MG PO TBEC: 20.0000 mg | DELAYED_RELEASE_TABLET | Freq: Every day | ORAL | Status: DC

## 2014-03-17 MED ORDER — ONDANSETRON HCL 4 MG/2ML IJ SOLN
4.0000 mg | Freq: Once | INTRAMUSCULAR | Status: AC
  Administered 2014-03-17: 4 mg via INTRAVENOUS
  Filled 2014-03-17: qty 2

## 2014-03-17 MED ORDER — IOHEXOL 300 MG/ML  SOLN
50.0000 mL | Freq: Once | INTRAMUSCULAR | Status: AC | PRN
  Administered 2014-03-17: 50 mL via ORAL

## 2014-03-17 MED ORDER — CEPHALEXIN 500 MG PO CAPS: 500.0000 mg | ORAL_CAPSULE | Freq: Four times a day (QID) | ORAL | Status: DC

## 2014-03-17 MED ORDER — HYDROMORPHONE HCL PF 1 MG/ML IJ SOLN
1.0000 mg | Freq: Once | INTRAMUSCULAR | Status: AC
Start: 2014-03-17 — End: 2014-03-17
  Administered 2014-03-17: 1 mg via INTRAVENOUS
  Filled 2014-03-17: qty 1

## 2014-03-17 MED ORDER — SODIUM CHLORIDE 0.9 % IV BOLUS (SEPSIS)
1000.0000 mL | Freq: Once | INTRAVENOUS | Status: AC
  Administered 2014-03-17: 1000 mL via INTRAVENOUS

## 2014-03-17 MED ORDER — IOHEXOL 300 MG/ML  SOLN
100.0000 mL | Freq: Once | INTRAMUSCULAR | Status: AC | PRN
  Administered 2014-03-17: 100 mL via INTRAVENOUS

## 2014-03-17 MED ORDER — MORPHINE SULFATE 4 MG/ML IJ SOLN
4.0000 mg | Freq: Once | INTRAMUSCULAR | Status: AC
  Administered 2014-03-17: 4 mg via INTRAVENOUS
  Filled 2014-03-17: qty 1

## 2014-03-17 NOTE — Discharge Instructions (Signed)
Return to the ED with any concerns including worsening abdominal pain, vomiting and not able to keep down liquids, fever/chlls, decreased level of alertness/lethargy, or any other alarming symptoms

## 2014-03-17 NOTE — ED Provider Notes (Signed)
CSN: [id number]     Arrival date & time 03/17/14  1609 History   This chart was scribed for Sarah Beards, MD by Delphia Grates, ED Scribe. This patient was seen in room MH12/MH12 and the patient's care was started at 5:30 PM.  Chief Complaint  Patient presents with  . Abdominal Pain    Patient is a 48 y.o. female presenting with abdominal pain. The history is provided by the patient. No language interpreter was used.  Abdominal Pain Pain location:  RUQ and LUQ Pain radiates to:  Does not radiate Duration:  2 days Timing:  Constant Progression:  Waxing and waning Chronicity:  New Ineffective treatments:  None tried Associated symptoms: no chills, no dysuria, no fever, no nausea and no vomiting     HPI Comments: Sarah Randall is a 48 y.o. female with a history of HTN and DM who presents to the Emergency Department complaining of constant, waxing and waning, upper abdominal that began 2 days ago. She reports the pain is worse in the left upper abdomen. Pain is worsened by coughing, and pain is unchanged after eating. Patient has not tried any medicines for symptom relief. She denies history of simliar symptoms. She denies history of pancreatitic disease, gallbladder disease, or ulcers. Patient also denies appetite change, chills, emesis, nausea, dysuria and fever. Patient has no known allergies. Patient smokes and drinks alcohol occasionally.  Past Medical History  Diagnosis Date  . Hypertension   . Diabetes mellitus without complication    Past Surgical History  Procedure Laterality Date  . Tubal ligation     No family history on file. History  Substance Use Topics  . Smoking status: Current Some Day Smoker    Types: Cigarettes  . Smokeless tobacco: Never Used  . Alcohol Use: Yes     Comment: occasional   OB History   Grav Para Term Preterm Abortions TAB SAB Ect Mult Living                 Review of Systems  Constitutional: Negative for fever and chills.   Gastrointestinal: Positive for abdominal pain. Negative for nausea and vomiting.  Genitourinary: Negative for dysuria.  All other systems reviewed and are negative.     Allergies  Review of patient's allergies indicates no known allergies.  Home Medications   Current Outpatient Rx  Name  Route  Sig  Dispense  Refill  . candesartan (ATACAND) 16 MG tablet   Oral   Take 16 mg by mouth daily.         . Dapagliflozin Propanediol (FARXIGA PO)   Oral   Take by mouth.         . hydrochlorothiazide (HYDRODIURIL) 25 MG tablet   Oral   Take 25 mg by mouth daily.         Marland Kitchen HYDROcodone-acetaminophen (NORCO/VICODIN) 5-325 MG per tablet      1-2 tablets po q 6 hours prn moderate to severe pain   15 tablet   0   . ibuprofen (ADVIL,MOTRIN) 600 MG tablet   Oral   Take 1 tablet (600 mg total) by mouth 3 (three) times daily.   21 tablet   0    Triage Vitals: BP 120/83  Pulse 110  Temp(Src) 98.4 F (36.9 C) (Oral)  Resp 18  SpO2 100%  LMP 03/15/2014 Physical Exam  Nursing note and vitals reviewed. Constitutional: She is oriented to person, place, and time. She appears well-developed and well-nourished. No distress.  Appears  uncomfortable.  HENT:  Head: Normocephalic and atraumatic.  Nose: Nose normal.  Mouth/Throat: Oropharynx is clear and moist and mucous membranes are normal.  Moist mucous membranes.  Eyes: Conjunctivae and EOM are normal. Pupils are equal, round, and reactive to light. Right conjunctiva is not injected. Left conjunctiva is not injected. No scleral icterus.  Neck: Neck supple. No tracheal deviation present.  Cardiovascular: Normal rate and regular rhythm.   Pulmonary/Chest: Effort normal. No respiratory distress.  Abdominal: Soft. Bowel sounds are normal. There is tenderness. There is no rebound and no guarding.  Tenderness to palpation of left upper abdomen.  Musculoskeletal: Normal range of motion.  Neurological: She is alert and oriented to  person, place, and time.  Skin: Skin is warm and dry.  Psychiatric: She has a normal mood and affect. Her behavior is normal.    ED Course  Procedures (including critical care time) DIAGNOSTIC STUDIES: Oxygen Saturation is 100% on RA, normal by my interpretation.    COORDINATION OF CARE: 5:36 PM- In addition to blood work, will order CT of abdomen and CXR. Will also order IV fluids, Zofran and Morphine. Pt advised of plan for treatment and pt agrees.   Labs Review Labs Reviewed  URINALYSIS, ROUTINE W REFLEX MICROSCOPIC - Abnormal; Notable for the following:    Color, Urine AMBER (*)    APPearance CLOUDY (*)    Hgb urine dipstick LARGE (*)    Protein, ur 30 (*)    Leukocytes, UA TRACE (*)    All other components within normal limits  CBC - Abnormal; Notable for the following:    WBC 16.0 (*)    All other components within normal limits  COMPREHENSIVE METABOLIC PANEL - Abnormal; Notable for the following:    Glucose, Bld 102 (*)    Total Bilirubin <0.2 (*)    GFR calc non Af Amer 59 (*)    GFR calc Af Amer 68 (*)    All other components within normal limits  URINE MICROSCOPIC-ADD ON - Abnormal; Notable for the following:    Squamous Epithelial / LPF FEW (*)    Bacteria, UA MANY (*)    Casts HYALINE CASTS (*)    All other components within normal limits  PREGNANCY, URINE  LIPASE, BLOOD    Imaging Review Dg Chest 2 View  03/17/2014   CLINICAL DATA:  Upper abdominal pain began 2 days ago, worse in the left upper abdomen  EXAM: CHEST  2 VIEW  COMPARISON:  DG CHEST 2 VIEW dated 12/02/2013  FINDINGS: Heart size is upper normal likely exaggerated by a limited inspiratory effect. Mild elevation of the right diaphragm similar to prior study. Lungs are clear except for mild subsegmental atelectasis in the right middle and lower lobe. No pleural effusions.  IMPRESSION: No significant acute cardiopulmonary findings.   Electronically Signed   By: Skipper Cliche M.D.   On: 03/17/2014  19:47   Ct Abdomen Pelvis W Contrast  03/17/2014   CLINICAL DATA:  Abdominal pain  EXAM: CT ABDOMEN AND PELVIS WITH CONTRAST  TECHNIQUE: Multidetector CT imaging of the abdomen and pelvis was performed using the standard protocol following bolus administration of intravenous contrast.  CONTRAST:  37mL OMNIPAQUE IOHEXOL 300 MG/ML SOLN, 163mL OMNIPAQUE IOHEXOL 300 MG/ML SOLN  COMPARISON:  None.  FINDINGS: Minimal hypoventilation left lung base; lung bases otherwise unremarkable.  The liver, spleen, adrenals, pancreas, kidneys are unremarkable.  The bowel is negative.  The appendix is identified is unremarkable.  No abdominal nor pelvic  masses, free fluid, loculated fluid collections, nor adenopathy.  There is no evidence of abdominal aortic aneurysm. The celiac, SMA, IMA, portal vein, SMV are opacified.  A very small fat containing umbilical hernia is appreciated. There is no evidence of an inguinal hernia.  The no aggressive appearing osseous lesions.  IMPRESSION: No evidence of focal or acute abnormalities. Small fat containing umbilical hernia.No CT evidence accounting for the patient's clinical presentation.   Electronically Signed   By: Margaree Mackintosh M.D.   On: 03/17/2014 19:39    MDM   Final diagnoses:  Abdominal pain  UTI (lower urinary tract infection)  Leukocytosis    Pt presenting with c/o upper abdominal pain, labs reveal leukocytosis, some signs of UTI on urine, but symptom are more upper abdomen, LFTs and lipase are reassuring.  Abdominal CT scan reassuring.  Pt is feeling improved after pain meds.  Discharged with strict return precautions.  Pt agreeable with plan.  I personally performed the services described in this documentation, which was scribed in my presence. The recorded information has been reviewed and is accurate.   Sarah Beards, MD 03/18/14 2124

## 2014-03-17 NOTE — ED Notes (Signed)
Upper abd pain since Thursday, worse with breathing and cough

## 2014-04-06 DIAGNOSIS — E669 Obesity, unspecified: Secondary | ICD-10-CM | POA: Insufficient documentation

## 2014-04-06 DIAGNOSIS — E119 Type 2 diabetes mellitus without complications: Secondary | ICD-10-CM | POA: Insufficient documentation

## 2014-04-06 DIAGNOSIS — E1169 Type 2 diabetes mellitus with other specified complication: Secondary | ICD-10-CM | POA: Insufficient documentation

## 2014-07-16 ENCOUNTER — Emergency Department (HOSPITAL_BASED_OUTPATIENT_CLINIC_OR_DEPARTMENT_OTHER): Payer: Self-pay

## 2014-07-16 ENCOUNTER — Emergency Department (HOSPITAL_BASED_OUTPATIENT_CLINIC_OR_DEPARTMENT_OTHER)
Admission: EM | Admit: 2014-07-16 | Discharge: 2014-07-17 | Disposition: A | Discharge status: Home / Self Care | Payer: Self-pay | Attending: Emergency Medicine | Admitting: Emergency Medicine

## 2014-07-16 ENCOUNTER — Encounter (HOSPITAL_BASED_OUTPATIENT_CLINIC_OR_DEPARTMENT_OTHER): Payer: Self-pay | Admitting: Emergency Medicine

## 2014-07-16 DIAGNOSIS — Z792 Long term (current) use of antibiotics: Secondary | ICD-10-CM | POA: Insufficient documentation

## 2014-07-16 DIAGNOSIS — Z9851 Tubal ligation status: Secondary | ICD-10-CM | POA: Insufficient documentation

## 2014-07-16 DIAGNOSIS — R109 Unspecified abdominal pain: Secondary | ICD-10-CM | POA: Insufficient documentation

## 2014-07-16 DIAGNOSIS — Z791 Long term (current) use of non-steroidal anti-inflammatories (NSAID): Secondary | ICD-10-CM | POA: Insufficient documentation

## 2014-07-16 DIAGNOSIS — F17200 Nicotine dependence, unspecified, uncomplicated: Secondary | ICD-10-CM | POA: Insufficient documentation

## 2014-07-16 DIAGNOSIS — E119 Type 2 diabetes mellitus without complications: Secondary | ICD-10-CM | POA: Insufficient documentation

## 2014-07-16 DIAGNOSIS — I10 Essential (primary) hypertension: Secondary | ICD-10-CM | POA: Insufficient documentation

## 2014-07-16 DIAGNOSIS — R1032 Left lower quadrant pain: Secondary | ICD-10-CM

## 2014-07-16 DIAGNOSIS — M79609 Pain in unspecified limb: Secondary | ICD-10-CM | POA: Insufficient documentation

## 2014-07-16 DIAGNOSIS — Z79899 Other long term (current) drug therapy: Secondary | ICD-10-CM | POA: Insufficient documentation

## 2014-07-16 IMAGING — CR DG HIP (WITH OR WITHOUT PELVIS) 2-3V*L*
3 series · 3 of 3 positions shown · non-contrast
Comparison: None.

CLINICAL DATA: Left upper thigh pain.

EXAM:
LEFT HIP - COMPLETE 2+ VIEW

[t pelvis a.p.]
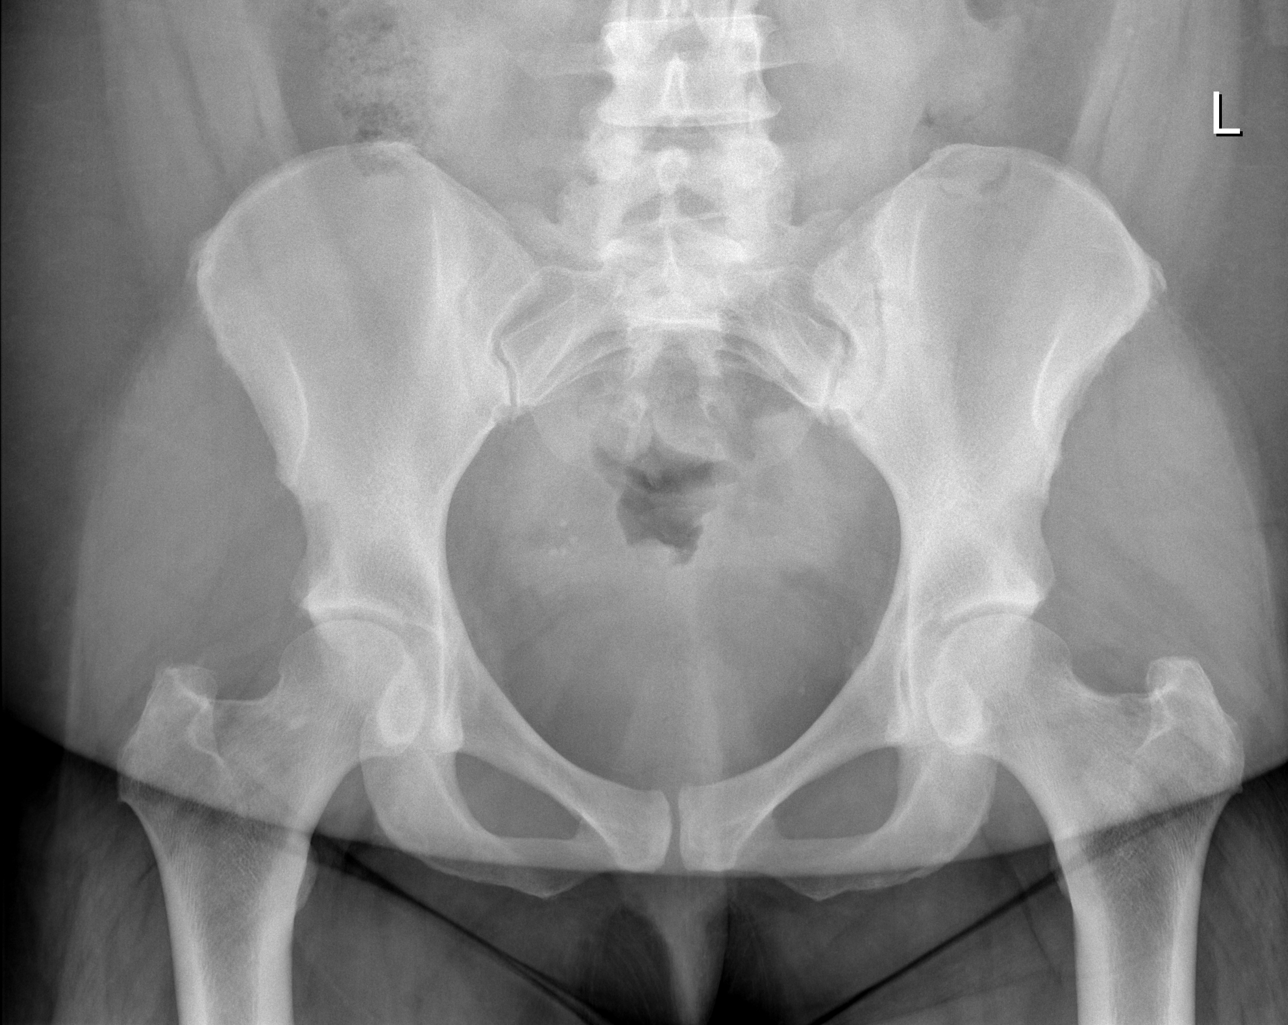

[t hip ap left]
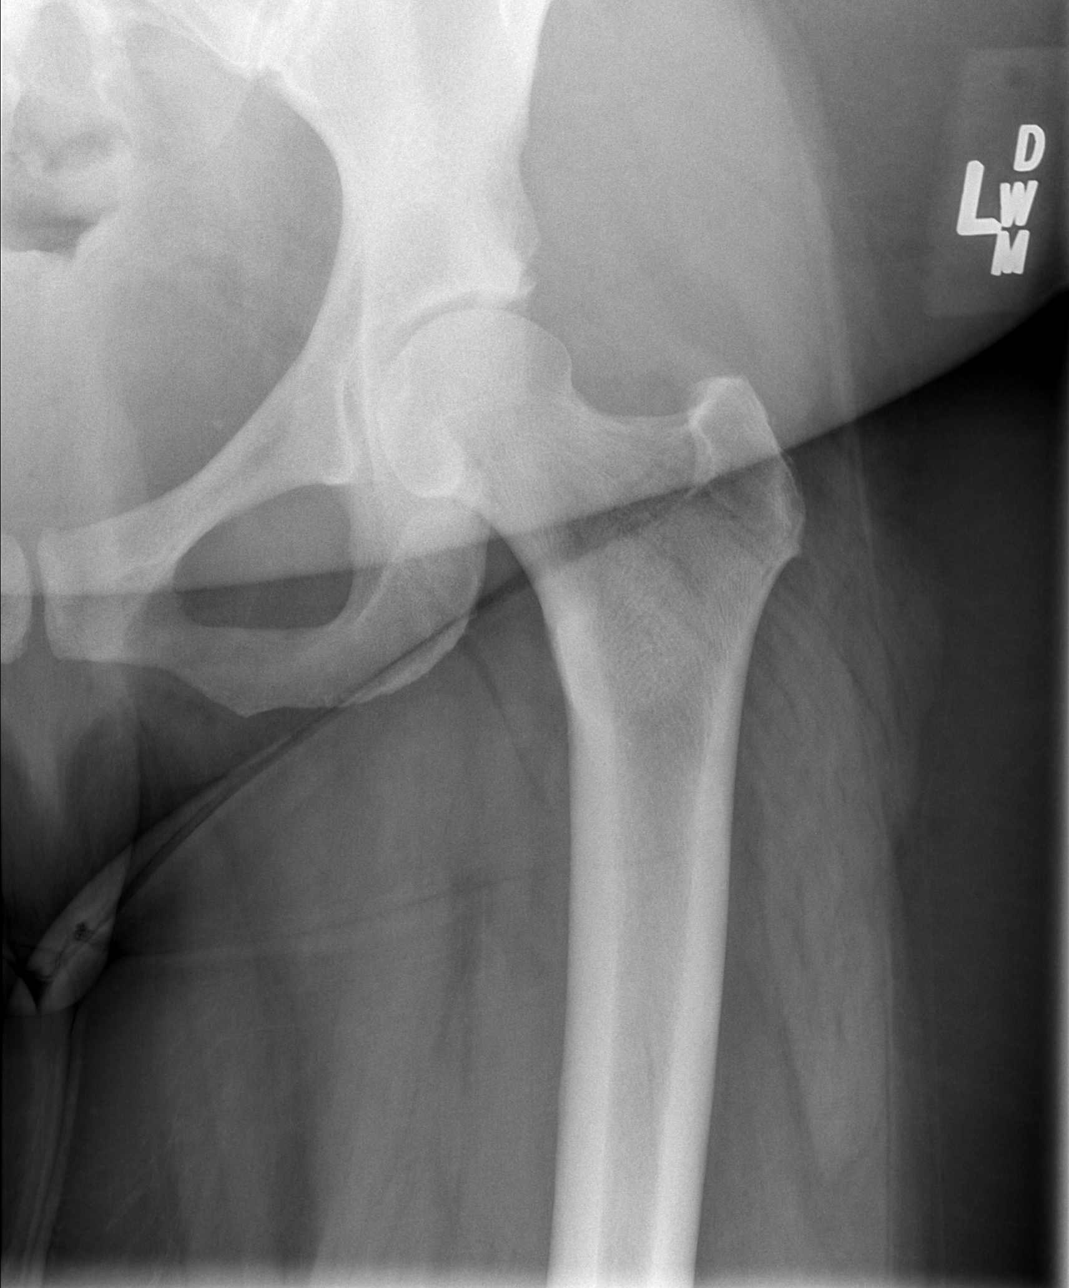

[t hip frog leg left]
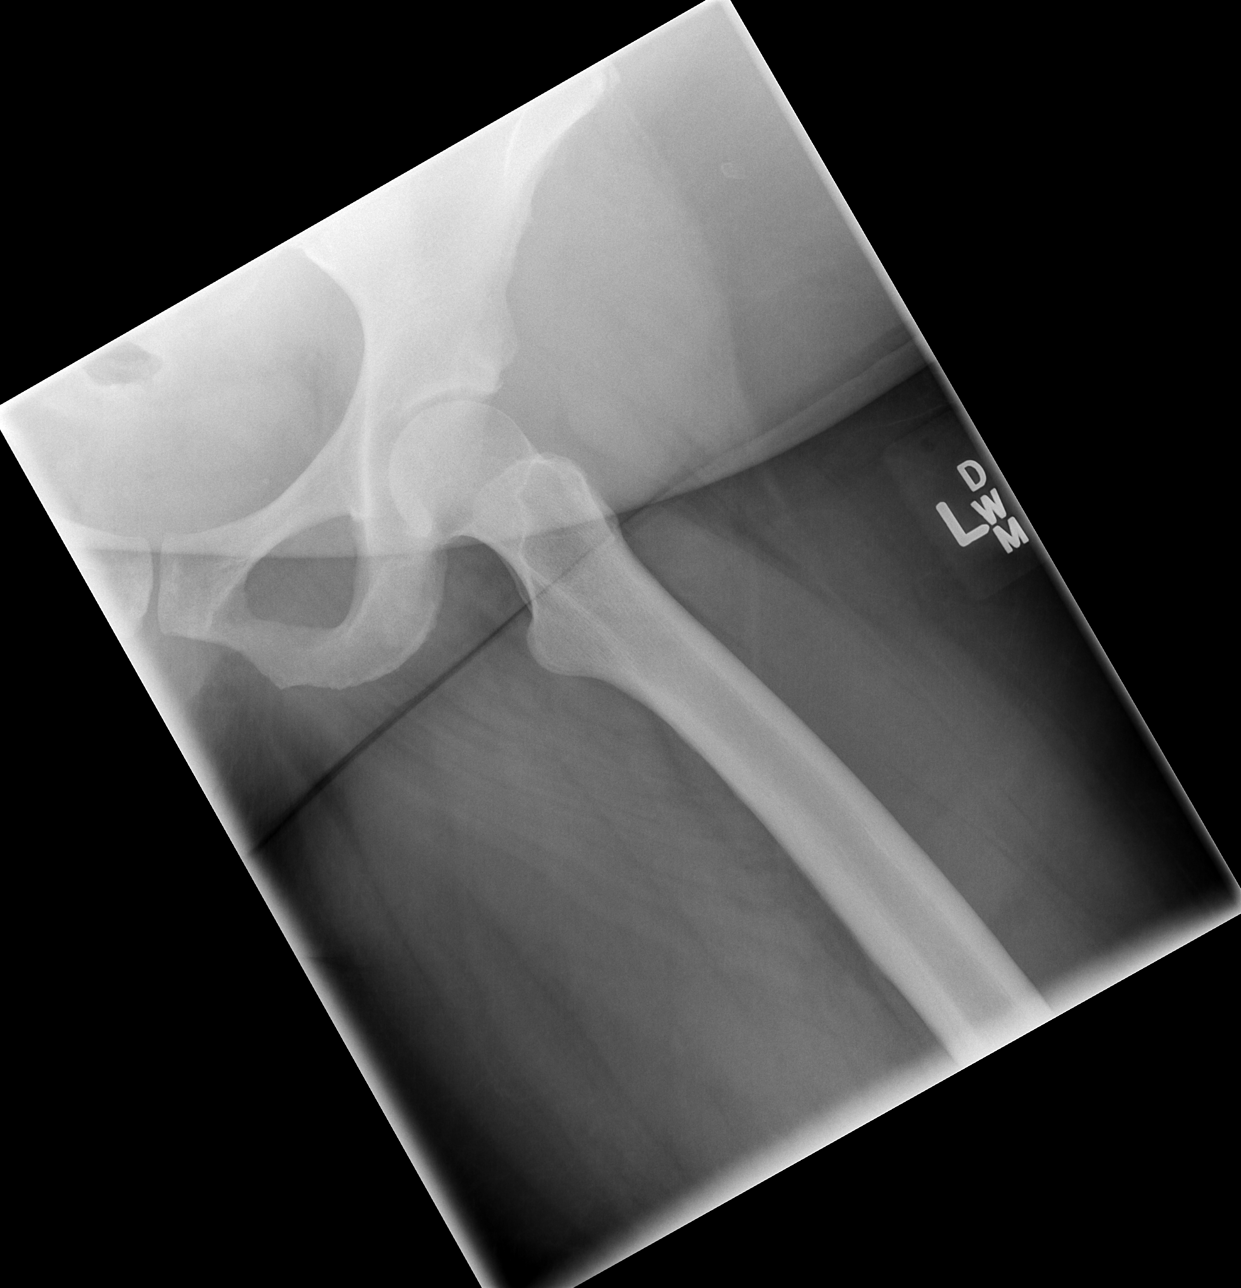

[3 of 3 positions shown; findings below may reference images not displayed]

FINDINGS: There is no evidence of fracture or dislocation. Both femoral heads
are seated normally within their respective acetabula. The proximal
left femur appears intact. No significant degenerative change is
appreciated. The sacroiliac joints are unremarkable in appearance.

The visualized bowel gas pattern is grossly unremarkable in
appearance. Scattered phleboliths are noted within the pelvis.
IMPRESSION: No evidence of fracture or dislocation.

## 2014-07-16 MED ORDER — OXYCODONE-ACETAMINOPHEN 5-325 MG PO TABS
ORAL_TABLET | ORAL | Status: AC
  Administered 2014-07-16: 2
  Filled 2014-07-16: qty 2

## 2014-07-16 NOTE — ED Provider Notes (Signed)
CSN: [id number]     Arrival date & time 07/16/14  2015 History   First MD Initiated Contact with Patient 07/16/14 2225     Chief Complaint  Patient presents with  . Leg Pain     (Consider location/radiation/quality/duration/timing/severity/associated sxs/prior Treatment) Patient is a 48 y.o. female presenting with leg pain. The history is provided by the patient. No language interpreter was used.  Leg Pain Location:  Leg Time since incident:  2 weeks Injury: no   Leg location:  L leg Pain details:    Quality:  Aching   Radiates to:  Does not radiate   Severity:  Moderate   Onset quality:  Gradual   Duration:  2 weeks   Timing:  Constant   Progression:  Worsening Chronicity:  New Dislocation: no   Foreign body present:  No foreign bodies Prior injury to area:  No Relieved by:  Nothing Worsened by:  Nothing tried Ineffective treatments:  None tried Associated symptoms: no back pain, no muscle weakness, no numbness, no stiffness and no swelling   Risk factors: no concern for non-accidental trauma     Past Medical History  Diagnosis Date  . Hypertension   . Diabetes mellitus without complication    Past Surgical History  Procedure Laterality Date  . Tubal ligation     History reviewed. No pertinent family history. History  Substance Use Topics  . Smoking status: Current Some Day Smoker -- 0.50 packs/day    Types: Cigarettes  . Smokeless tobacco: Never Used  . Alcohol Use: Yes     Comment: occasional   OB History   Grav Para Term Preterm Abortions TAB SAB Ect Mult Living                 Review of Systems  Musculoskeletal: Positive for myalgias. Negative for back pain, joint swelling and stiffness.  All other systems reviewed and are negative.     Allergies  Review of patient's allergies indicates no known allergies.  Home Medications   Prior to Admission medications   Medication Sig Start Date End Date Taking? Authorizing Provider  losartan (COZAAR)  50 MG tablet Take 50 mg by mouth daily.   Yes Historical Provider, MD  candesartan (ATACAND) 16 MG tablet Take 16 mg by mouth daily.    Historical Provider, MD  cephALEXin (KEFLEX) 500 MG capsule Take 1 capsule (500 mg total) by mouth 4 (four) times daily. 03/17/14   Threasa Beards, MD  Dapagliflozin Propanediol (FARXIGA PO) Take by mouth.    Historical Provider, MD  hydrochlorothiazide (HYDRODIURIL) 25 MG tablet Take 25 mg by mouth daily.    Historical Provider, MD  HYDROcodone-acetaminophen (NORCO/VICODIN) 5-325 MG per tablet 1-2 tablets po q 6 hours prn moderate to severe pain 12/02/13   Saddie Benders. Ghim, MD  ibuprofen (ADVIL,MOTRIN) 600 MG tablet Take 1 tablet (600 mg total) by mouth 3 (three) times daily. 12/02/13   Saddie Benders. Ghim, MD  pantoprazole (PROTONIX) 20 MG tablet Take 1 tablet (20 mg total) by mouth daily. 03/17/14   Threasa Beards, MD   BP 146/70  Pulse 96  Temp(Src) 97.7 F (36.5 C) (Oral)  Resp 16  Ht 5\' 4"  (1.626 m)  Wt 256 lb (116.121 kg)  BMI 43.92 kg/m2  SpO2 100%  LMP 06/22/2014 Physical Exam  Nursing note and vitals reviewed. Constitutional: She is oriented to person, place, and time. She appears well-developed and well-nourished.  HENT:  Head: Normocephalic.  Eyes: EOM are normal.  Neck: Normal range of motion.  Pulmonary/Chest: Effort normal.  Abdominal: She exhibits no distension.  Musculoskeletal: She exhibits tenderness.  Tender left groin,  No swelling,  From,  nv and ns intact  Neurological: She is alert and oriented to person, place, and time.  Psychiatric: She has a normal mood and affect.    ED Course  Procedures (including critical care time) Labs Review Labs Reviewed - No data to display  Imaging Review No results found.   EKG Interpretation None      MDM   Final diagnoses:  Unilateral groin pain, left    Results for orders placed during the hospital encounter of 03/17/14  PREGNANCY, URINE      Result Value Ref Range   Preg  Test, Ur NEGATIVE  NEGATIVE  URINALYSIS, ROUTINE W REFLEX MICROSCOPIC      Result Value Ref Range   Color, Urine AMBER (*) YELLOW   APPearance CLOUDY (*) CLEAR   Specific Gravity, Urine 1.026  1.005 - 1.030   pH 5.5  5.0 - 8.0   Glucose, UA NEGATIVE  NEGATIVE mg/dL   Hgb urine dipstick LARGE (*) NEGATIVE   Bilirubin Urine NEGATIVE  NEGATIVE   Ketones, ur NEGATIVE  NEGATIVE mg/dL   Protein, ur 30 (*) NEGATIVE mg/dL   Urobilinogen, UA 0.2  0.0 - 1.0 mg/dL   Nitrite NEGATIVE  NEGATIVE   Leukocytes, UA TRACE (*) NEGATIVE  CBC      Result Value Ref Range   WBC 16.0 (*) 4.0 - 10.5 K/uL   RBC 4.45  3.87 - 5.11 MIL/uL   Hemoglobin 14.3  12.0 - 15.0 g/dL   HCT 41.6  36.0 - 46.0 %   MCV 93.5  78.0 - 100.0 fL   MCH 32.1  26.0 - 34.0 pg   MCHC 34.4  30.0 - 36.0 g/dL   RDW 13.4  11.5 - 15.5 %   Platelets 371  150 - 400 K/uL  COMPREHENSIVE METABOLIC PANEL      Result Value Ref Range   Sodium 137  137 - 147 mEq/L   Potassium 4.0  3.7 - 5.3 mEq/L   Chloride 98  96 - 112 mEq/L   CO2 22  19 - 32 mEq/L   Glucose, Bld 102 (*) 70 - 99 mg/dL   BUN 17  6 - 23 mg/dL   Creatinine, Ser 1.10  0.50 - 1.10 mg/dL   Calcium 9.7  8.4 - 10.5 mg/dL   Total Protein 8.0  6.0 - 8.3 g/dL   Albumin 4.3  3.5 - 5.2 g/dL   AST 23  0 - 37 U/L   ALT 15  0 - 35 U/L   Alkaline Phosphatase 75  39 - 117 U/L   Total Bilirubin <0.2 (*) 0.3 - 1.2 mg/dL   GFR calc non Af Amer 59 (*) >90 mL/min   GFR calc Af Amer 68 (*) >90 mL/min  LIPASE, BLOOD      Result Value Ref Range   Lipase 39  11 - 59 U/L  URINE MICROSCOPIC-ADD ON      Result Value Ref Range   Squamous Epithelial / LPF FEW (*) RARE   WBC, UA 3-6  <3 WBC/hpf   RBC / HPF 7-10  <3 RBC/hpf   Bacteria, UA MANY (*) RARE   Casts HYALINE CASTS (*) NEGATIVE   Urine-Other MUCOUS PRESENT     Dg Hip Complete Left  07/16/2014   CLINICAL DATA:  Left upper thigh pain.  EXAM:  LEFT HIP - COMPLETE 2+ VIEW  COMPARISON:  None.  FINDINGS: There is no evidence of fracture or  dislocation. Both femoral heads are seated normally within their respective acetabula. The proximal left femur appears intact. No significant degenerative change is appreciated. The sacroiliac joints are unremarkable in appearance.  The visualized bowel gas pattern is grossly unremarkable in appearance. Scattered phleboliths are noted within the pelvis.  IMPRESSION: No evidence of fracture or dislocation.   Electronically Signed   By: Garald Balding M.D.   On: 07/16/2014 23:26       Fransico Meadow, PA-C 07/17/14 [place], PA-C 07/17/14 [phone]

## 2014-07-16 NOTE — ED Notes (Signed)
Pt c/o left upper thigh pain w/o injury x 2 weeks

## 2014-07-17 MED ORDER — OXYCODONE-ACETAMINOPHEN 5-325 MG PO TABS: 2.0000 | ORAL_TABLET | ORAL | Status: DC | PRN

## 2014-07-17 MED ORDER — IBUPROFEN 800 MG PO TABS: 800.0000 mg | ORAL_TABLET | Freq: Three times a day (TID) | ORAL | Status: DC

## 2014-07-17 NOTE — Discharge Instructions (Signed)

## 2014-07-18 NOTE — ED Provider Notes (Signed)
Medical screening examination/treatment/procedure(s) were performed by non-physician practitioner and as supervising physician I was immediately available for consultation/collaboration.   EKG Interpretation None        Houston Siren III, MD 07/18/14 1435

## 2014-10-12 ENCOUNTER — Encounter (HOSPITAL_BASED_OUTPATIENT_CLINIC_OR_DEPARTMENT_OTHER): Payer: Self-pay

## 2014-10-12 ENCOUNTER — Emergency Department (HOSPITAL_BASED_OUTPATIENT_CLINIC_OR_DEPARTMENT_OTHER): Payer: Self-pay

## 2014-10-12 ENCOUNTER — Emergency Department (HOSPITAL_BASED_OUTPATIENT_CLINIC_OR_DEPARTMENT_OTHER)
Admission: EM | Admit: 2014-10-12 | Discharge: 2014-10-12 | Disposition: A | Discharge status: Home / Self Care | Payer: Self-pay | Attending: Emergency Medicine | Admitting: Emergency Medicine

## 2014-10-12 DIAGNOSIS — E669 Obesity, unspecified: Secondary | ICD-10-CM | POA: Insufficient documentation

## 2014-10-12 DIAGNOSIS — J069 Acute upper respiratory infection, unspecified: Secondary | ICD-10-CM | POA: Insufficient documentation

## 2014-10-12 DIAGNOSIS — Z79899 Other long term (current) drug therapy: Secondary | ICD-10-CM | POA: Insufficient documentation

## 2014-10-12 DIAGNOSIS — E119 Type 2 diabetes mellitus without complications: Secondary | ICD-10-CM | POA: Insufficient documentation

## 2014-10-12 DIAGNOSIS — Z720 Tobacco use: Secondary | ICD-10-CM | POA: Insufficient documentation

## 2014-10-12 DIAGNOSIS — R05 Cough: Secondary | ICD-10-CM

## 2014-10-12 DIAGNOSIS — R059 Cough, unspecified: Secondary | ICD-10-CM

## 2014-10-12 DIAGNOSIS — I10 Essential (primary) hypertension: Secondary | ICD-10-CM | POA: Insufficient documentation

## 2014-10-12 LAB — CBG MONITORING, ED: Glucose-Capillary: 103 mg/dL — ABNORMAL HIGH (ref 70–99)

## 2014-10-12 IMAGING — CR DG CHEST 2V
2 series · 2 of 2 positions shown · non-contrast
Comparison: PA and lateral chest of [DATE]

CLINICAL DATA: Cough, chest pain, and headache for 3 days ; current
tobacco use ; diabetes

EXAM:
CHEST  2 VIEW

[w chest pa]
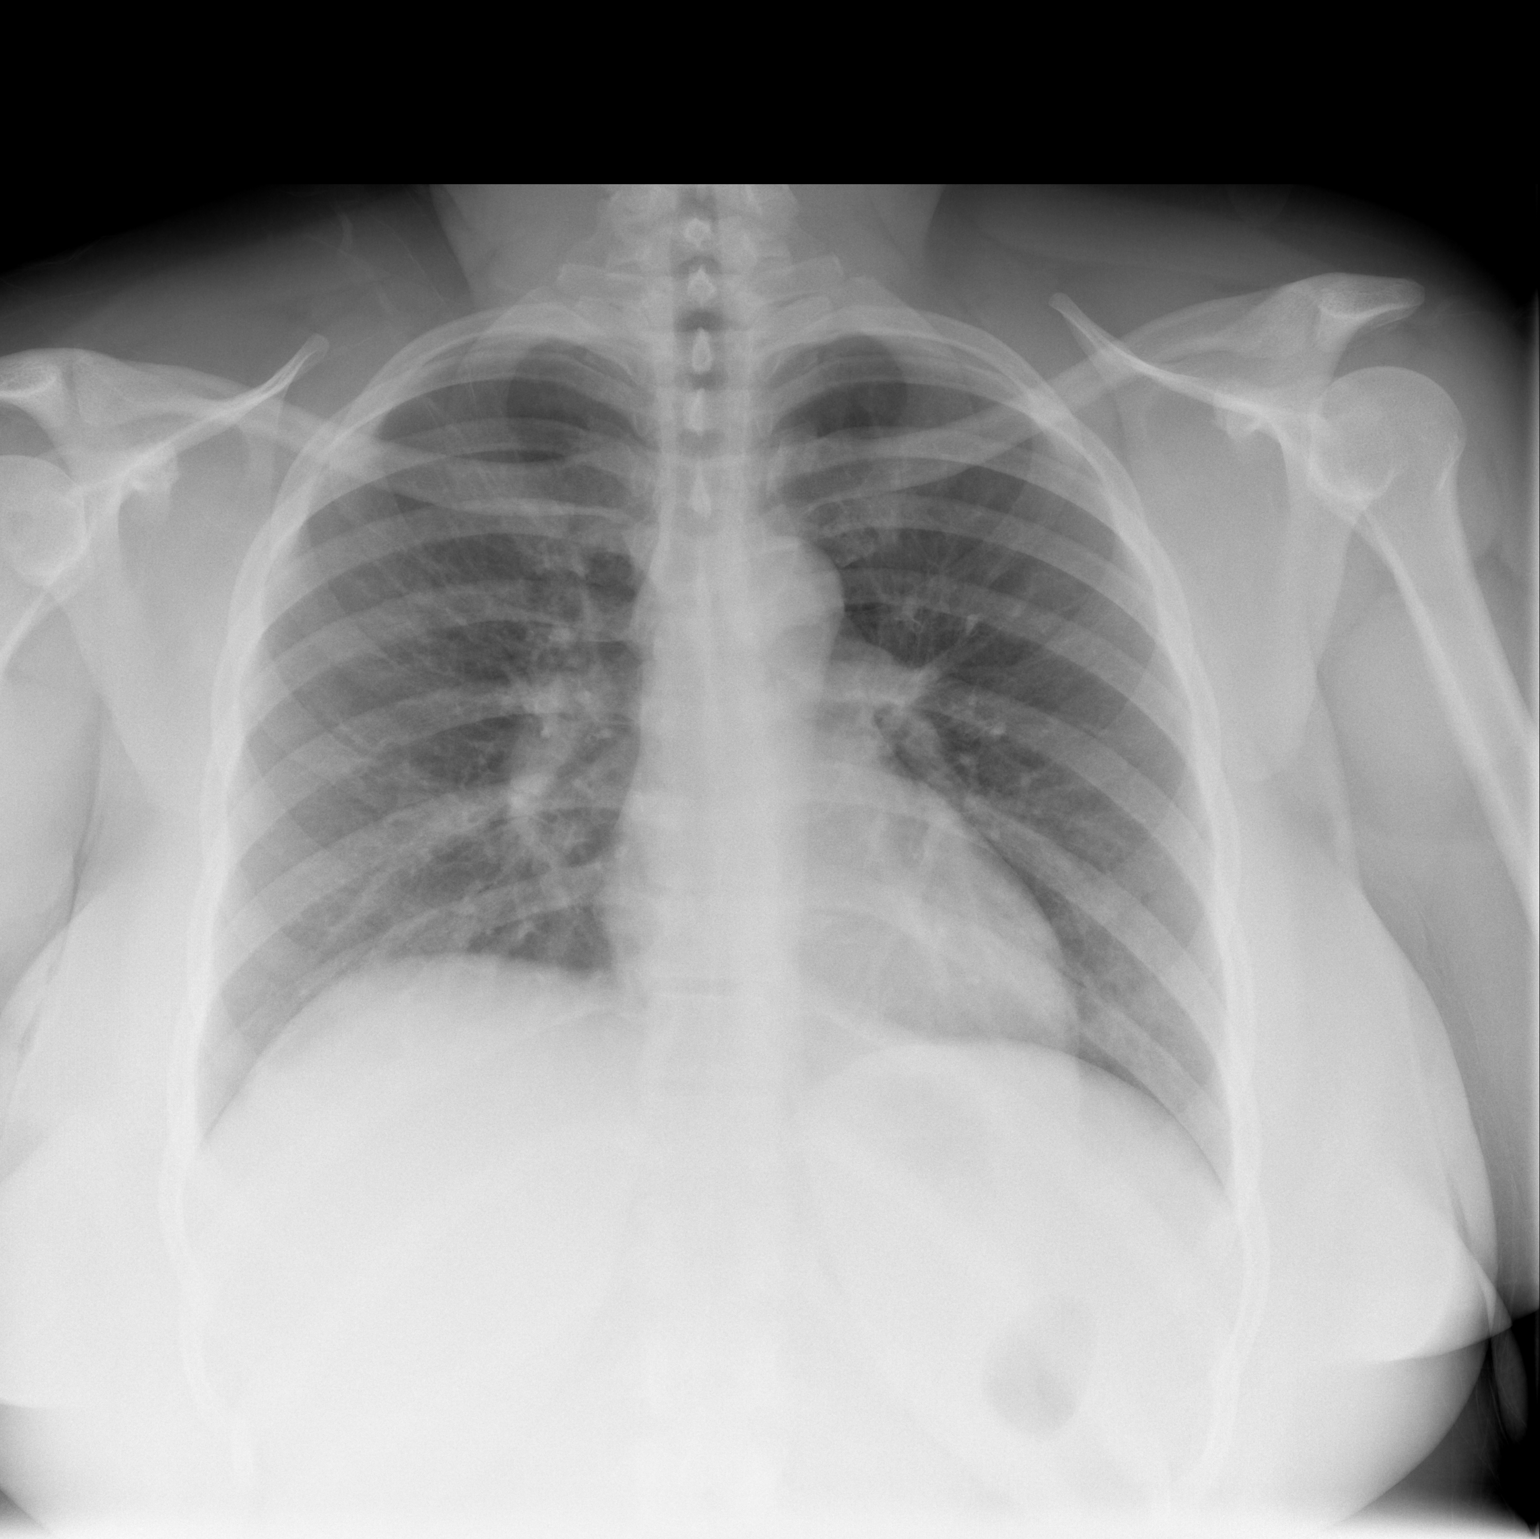

[w chest lat]
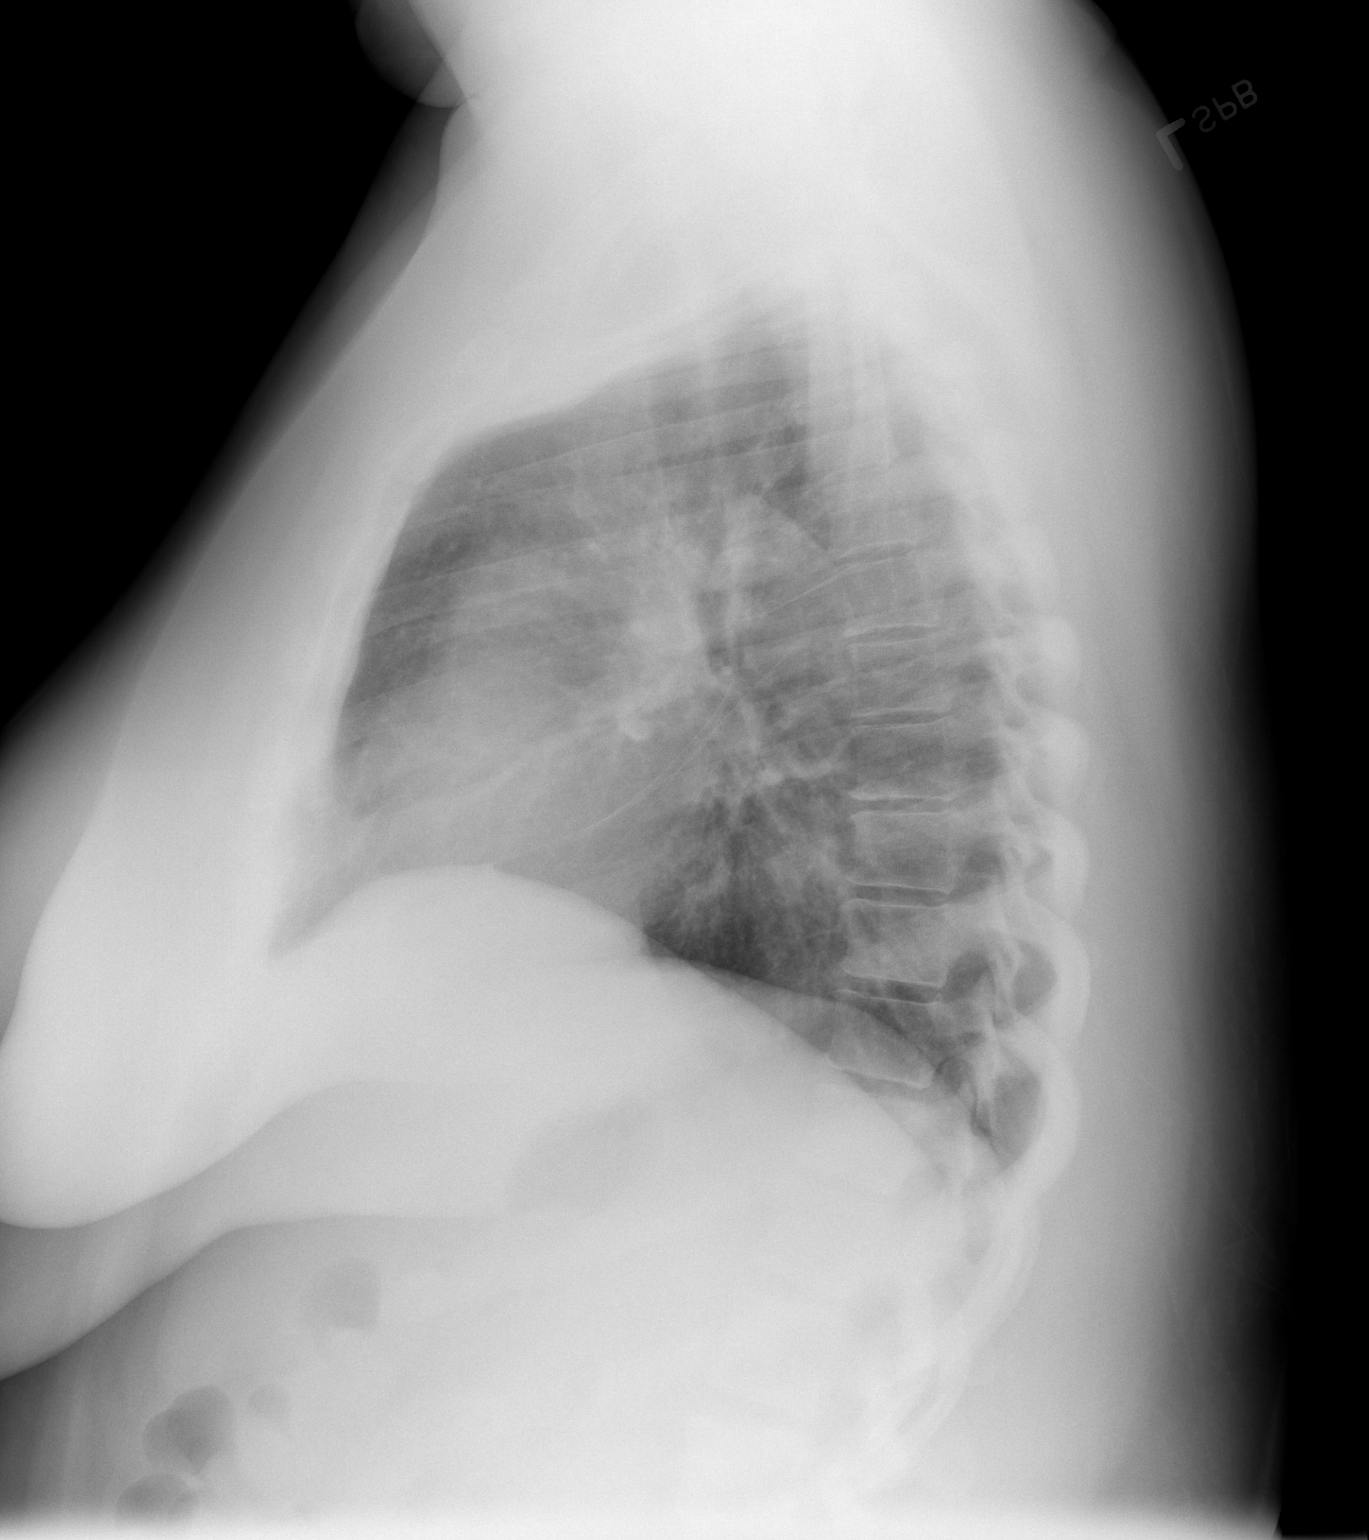

[2 of 2 positions shown; findings below may reference images not displayed]

FINDINGS: The lungs are adequately inflated. The interstitial markings are
coarse but stable. There is no alveolar infiltrate. There is no
pleural effusion or pneumothorax. The cardiac silhouette is normal
in size. The pulmonary vascularity is not engorged. The bony thorax
is unremarkable.
IMPRESSION: There is no evidence of pneumonia. Chronically increased
interstitial markings likely reflects the patient's smoking history.

## 2014-10-12 MED ORDER — OXYMETAZOLINE HCL 0.05 % NA SOLN
1.0000 | Freq: Once | NASAL | Status: AC
  Administered 2014-10-12: 1 via NASAL
  Filled 2014-10-12: qty 15

## 2014-10-12 MED ORDER — BENZONATATE 100 MG PO CAPS
100.0000 mg | ORAL_CAPSULE | Freq: Once | ORAL | Status: AC
  Administered 2014-10-12: 100 mg via ORAL
  Filled 2014-10-12: qty 1

## 2014-10-12 MED ORDER — ACETAMINOPHEN 325 MG PO TABS
650.0000 mg | ORAL_TABLET | Freq: Once | ORAL | Status: AC
  Administered 2014-10-12: 650 mg via ORAL
  Filled 2014-10-12: qty 2

## 2014-10-12 MED ORDER — BENZONATATE 100 MG PO CAPS: 100.0000 mg | ORAL_CAPSULE | Freq: Three times a day (TID) | ORAL | Status: DC

## 2014-10-12 NOTE — Discharge Instructions (Signed)
Smoking Cessation Quitting smoking is important to your health and has many advantages. However, it is not always easy to quit since nicotine is a very addictive drug. Oftentimes, people try 3 times or more before being able to quit. This document explains the best ways for you to prepare to quit smoking. Quitting takes hard work and a lot of effort, but you can do it. ADVANTAGES OF QUITTING SMOKING  You will live longer, feel better, and live better.  Your body will feel the impact of quitting smoking almost immediately.  Within 20 minutes, blood pressure decreases. Your pulse returns to its normal level.  After 8 hours, carbon monoxide levels in the blood return to normal. Your oxygen level increases.  After 24 hours, the chance of having a heart attack starts to decrease. Your breath, hair, and body stop smelling like smoke.  After 48 hours, damaged nerve endings begin to recover. Your sense of taste and smell improve.  After 72 hours, the body is virtually free of nicotine. Your bronchial tubes relax and breathing becomes easier.  After 2 to 12 weeks, lungs can hold more air. Exercise becomes easier and circulation improves.  The risk of having a heart attack, stroke, cancer, or lung disease is greatly reduced.  After 1 year, the risk of coronary heart disease is cut in half.  After 5 years, the risk of stroke falls to the same as a nonsmoker.  After 10 years, the risk of lung cancer is cut in half and the risk of other cancers decreases significantly.  After 15 years, the risk of coronary heart disease drops, usually to the level of a nonsmoker.  If you are pregnant, quitting smoking will improve your chances of having a healthy baby.  The people you live with, especially any children, will be healthier.  You will have extra money to spend on things other than cigarettes. QUESTIONS TO THINK ABOUT BEFORE ATTEMPTING TO QUIT You may want to talk about your answers with your  health care provider.  Why do you want to quit?  If you tried to quit in the past, what helped and what did not?  What will be the most difficult situations for you after you quit? How will you plan to handle them?  Who can help you through the tough times? Your family? Friends? A health care provider?  What pleasures do you get from smoking? What ways can you still get pleasure if you quit? Here are some questions to ask your health care provider:  How can you help me to be successful at quitting?  What medicine do you think would be best for me and how should I take it?  What should I do if I need more help?  What is smoking withdrawal like? How can I get information on withdrawal? GET READY  Set a quit date.  Change your environment by getting rid of all cigarettes, ashtrays, matches, and lighters in your home, car, or work. Do not let people smoke in your home.  Review your past attempts to quit. Think about what worked and what did not. GET SUPPORT AND ENCOURAGEMENT You have a better chance of being successful if you have help. You can get support in many ways.  Tell your family, friends, and coworkers that you are going to quit and need their support. Ask them not to smoke around you.  Get individual, group, or telephone counseling and support. Programs are available at [employer] and health centers. Call  your local health department for information about programs in your area.  Spiritual beliefs and practices may help some smokers quit.  Download a "quit meter" on your computer to keep track of quit statistics, such as how long you have gone without smoking, cigarettes not smoked, and money saved.  Get a self-help book about quitting smoking and staying off tobacco. [place] yourself from urges to smoke. Talk to someone, go for a walk, or occupy your time with a task.  Change your normal routine. Take a different route to work.  Drink tea instead of coffee. Eat breakfast in a different place.  Reduce your stress. Take a hot bath, exercise, or read a book.  Plan something enjoyable to do every day. Reward yourself for not smoking.  Explore interactive web-based programs that specialize in helping you quit. GET MEDICINE AND USE IT CORRECTLY Medicines can help you stop smoking and decrease the urge to smoke. Combining medicine with the above behavioral methods and support can greatly increase your chances of successfully quitting smoking.  Nicotine replacement therapy helps deliver nicotine to your body without the negative effects and risks of smoking. Nicotine replacement therapy includes nicotine gum, lozenges, inhalers, nasal sprays, and skin patches. Some may be available over-the-counter and others require a prescription.  Antidepressant medicine helps people abstain from smoking, but how this works is unknown. This medicine is available by prescription.  Nicotinic receptor partial agonist medicine simulates the effect of nicotine in your brain. This medicine is available by prescription. Ask your health care provider for advice about which medicines to use and how to use them based on your health history. Your health care provider will tell you what side effects to look out for if you choose to be on a medicine or therapy. Carefully read the information on the package. Do not use any other product containing nicotine while using a nicotine replacement product.  RELAPSE OR DIFFICULT SITUATIONS Most relapses occur within the first 3 months after quitting. Do not be discouraged if you start smoking again. Remember, most people try several times before finally quitting. You may have symptoms of withdrawal because your body is used to nicotine. You may crave cigarettes, be irritable, feel very hungry, cough often, get headaches, or have difficulty concentrating. The withdrawal symptoms are only temporary. They are strongest  when you first quit, but they will go away within 10-14 days. To reduce the chances of relapse, try to:  Avoid drinking alcohol. Drinking lowers your chances of successfully quitting.  Reduce the amount of caffeine you consume. Once you quit smoking, the amount of caffeine in your body increases and can give you symptoms, such as a rapid heartbeat, sweating, and anxiety.  Avoid smokers because they can make you want to smoke.  Do not let weight gain distract you. Many smokers will gain weight when they quit, usually less than 10 pounds. Eat a healthy diet and stay active. You can always lose the weight gained after you quit.  Find ways to improve your mood other than smoking. FOR MORE INFORMATION  www.smokefree.gov  Document Released: 11/17/2001 Document Revised: 04/09/2014 Document Reviewed: 03/03/2012 [facility] Patient Information 2015 [place], [place]. This information is not intended to replace advice given to you by your health care provider. Make sure you discuss any questions you have with your health care provider. Upper Respiratory Infection, Adult An upper respiratory infection (URI) is also known as the common cold. It is often caused by a  type of germ (virus). Colds are easily spread (contagious). You can pass it to others by kissing, coughing, sneezing, or drinking out of the same glass. Usually, you get better in 1 or 2 weeks.  HOME CARE   Only take medicine as told by your doctor.  Use a warm mist humidifier or breathe in steam from a hot shower.  Drink enough water and fluids to keep your pee (urine) clear or pale yellow.  Get plenty of rest.  Return to work when your temperature is back to normal or as told by your doctor. You may use a face mask and wash your hands to stop your cold from spreading. GET HELP RIGHT AWAY IF:   After the first few days, you feel you are getting worse.  You have questions about your medicine.  You have chills, shortness of breath, or  brown or red spit (mucus).  You have yellow or brown snot (nasal discharge) or pain in the face, especially when you bend forward.  You have a fever, puffy (swollen) neck, pain when you swallow, or white spots in the back of your throat.  You have a bad headache, ear pain, sinus pain, or chest pain.  You have a high-pitched whistling sound when you breathe in and out (wheezing).  You have a lasting cough or cough up blood.  You have sore muscles or a stiff neck. MAKE SURE YOU:   Understand these instructions.  Will watch your condition.  Will get help right away if you are not doing well or get worse. Document Released: 05/11/2008 Document Revised: 02/15/2012 Document Reviewed: 02/28/2014 [facility] Patient Information 2015 [place], [place]. This information is not intended to replace advice given to you by your health care provider. Make sure you discuss any questions you have with your health care provider.

## 2014-10-12 NOTE — ED Notes (Signed)
Pt reports congestion and cough since Tuesday. Denies fever. Reports productive cough with green/yellow sputum.

## 2014-10-12 NOTE — ED Provider Notes (Signed)
CSN: [id number]     Arrival date & time 10/12/14  0753 History   First MD Initiated Contact with Patient 10/12/14 [phone]     Chief Complaint  Patient presents with  . Cough     (Consider location/radiation/quality/duration/timing/severity/associated sxs/prior Treatment) HPI  Sarah Randall is a 48 year old female who comes in today complaining of nasal congestion, cough, and headache. Her symptoms began 2 days ago with nasal congestion and have progressed to pressure in her face with worsening congestion, some scratchy throat, and cough intermittently productive of some mildly discolored sputum. She is a smoker but has not been smoking during this period she reviewed reports no smoking-related illnesses in the past. She is not short of breath. She denies any wheezing. SHe has not had fever, chills, chest pain, nausea, vomiting, diarrhea, or rash. She has not had a flu shot this season. 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  She has taken over-the-counter cold medicine without relief.  Past Medical History  Diagnosis Date  . Hypertension   . Diabetes mellitus without complication    Past Surgical History  Procedure Laterality Date  . Tubal ligation     No family history on file. History  Substance Use Topics  . Smoking status: Current Some Day Smoker -- 0.50 packs/day    Types: Cigarettes  . Smokeless tobacco: Never Used  . Alcohol Use: Yes     Comment: occasional   OB History    No data available     Review of Systems  All other systems reviewed and are negative.     Allergies  Review of patient's allergies indicates no known allergies.  Home Medications   Prior to Admission medications   Medication Sig Start Date End Date Taking? Authorizing Provider  candesartan (ATACAND) 16 MG tablet Take 16 mg by mouth daily.    Historical Provider, MD  cephALEXin (KEFLEX) 500 MG capsule Take 1 capsule (500 mg total) by mouth 4 (four) times daily.  03/17/14   Threasa Beards, MD  Dapagliflozin Propanediol (FARXIGA PO) Take by mouth.    Historical Provider, MD  hydrochlorothiazide (HYDRODIURIL) 25 MG tablet Take 25 mg by mouth daily.    Historical Provider, MD  HYDROcodone-acetaminophen (NORCO/VICODIN) 5-325 MG per tablet 1-2 tablets po q 6 hours prn moderate to severe pain 12/02/13   Saddie Benders. Ghim, MD  ibuprofen (ADVIL,MOTRIN) 600 MG tablet Take 1 tablet (600 mg total) by mouth 3 (three) times daily. 12/02/13   Saddie Benders. Ghim, MD  ibuprofen (ADVIL,MOTRIN) 800 MG tablet Take 1 tablet (800 mg total) by mouth 3 (three) times daily. 07/17/14   Fransico Meadow, PA-C  losartan (COZAAR) 50 MG tablet Take 50 mg by mouth daily.    Historical Provider, MD  pantoprazole (PROTONIX) 20 MG tablet Take 1 tablet (20 mg total) by mouth daily. 03/17/14   Threasa Beards, MD   BP 122/82 mmHg  Pulse 102  Temp(Src) 98.7 F (37.1 C) (Oral)  Resp 20  Ht 5\' 4"  (1.626 m)  Wt 256 lb (116.121 kg)  BMI 43.92 kg/m2  SpO2 100%  LMP 09/07/2014 Physical Exam  Constitutional: She is oriented to person, place, and time. She appears well-developed. No distress.  Obese female  HENT:  Head: Normocephalic and atraumatic.  Right Ear: Tympanic membrane and external ear normal.  Left Ear: Tympanic membrane and external ear normal.  Nose: Mucosal edema present.  Mouth/Throat: Oropharynx is clear and moist. No oropharyngeal exudate  or posterior oropharyngeal edema.  Eyes: Conjunctivae and EOM are normal. Pupils are equal, round, and reactive to light. Right eye exhibits no discharge. Left eye exhibits no discharge.  Neck: Normal range of motion. Neck supple. No JVD present. No tracheal deviation present. No thyromegaly present.  Cardiovascular: Normal rate, regular rhythm and normal heart sounds.   Pulmonary/Chest: Effort normal and breath sounds normal. No stridor. No respiratory distress. She has no wheezes. She has no rales.  Abdominal: Soft. Bowel sounds are normal.  She exhibits no distension. There is no tenderness.  Musculoskeletal: Normal range of motion. She exhibits no edema or tenderness.  Lymphadenopathy:    She has no cervical adenopathy.  Neurological: She is alert and oriented to person, place, and time. She has normal reflexes.  Skin: Skin is warm and dry.  Psychiatric: She has a normal mood and affect. Judgment and thought content normal.  Nursing note and vitals reviewed.   ED Course  Procedures (including critical care time) Labs Review Labs Reviewed  CBG MONITORING, ED - Abnormal; Notable for the following:    Glucose-Capillary 103 (*)    All other components within normal limits    Imaging Review Dg Chest 2 View  10/12/2014   CLINICAL DATA:  Cough, chest pain, and headache for 3 days ; current tobacco use ; diabetes  EXAM: CHEST  2 VIEW  COMPARISON:  PA and lateral chest of March 17, 2014  FINDINGS: The lungs are adequately inflated. The interstitial markings are coarse but stable. There is no alveolar infiltrate. There is no pleural effusion or pneumothorax. The cardiac silhouette is normal in size. The pulmonary vascularity is not engorged. The bony thorax is unremarkable.  IMPRESSION: There is no evidence of pneumonia. Chronically increased interstitial markings likely reflects the patient's smoking history.   Electronically Signed   By: David  Martinique   On: 10/12/2014 08:30     MDM   Final diagnoses:  Cough  URI (upper respiratory infection)    48 year old female who presents today with symptoms consistent with upper respiratory infection. She is diabetic blood sugar checked here is 102. She is also a smoker and does not have any evidence of infiltrate on chest x-Darrall Strey. Symptoms appear isolated to the upper respiratory tract. She is given Afrin and Tessalon here. She is advised to continue Tylenol at home. She is advised against taking over-the-counter oral decongestants, she has spoken with her pharmacist regarding interactions  with blood pressure and diabetes. She is advised of return precautions and need for follow-up and voice understanding.    Shaune Pollack, MD 10/15/14 [phone]

## 2014-12-28 DIAGNOSIS — Z1239 Encounter for other screening for malignant neoplasm of breast: Secondary | ICD-10-CM | POA: Insufficient documentation

## 2016-04-20 DIAGNOSIS — M659 Synovitis and tenosynovitis, unspecified: Secondary | ICD-10-CM | POA: Insufficient documentation

## 2016-07-03 DIAGNOSIS — M5136 Other intervertebral disc degeneration, lumbar region: Secondary | ICD-10-CM | POA: Insufficient documentation

## 2016-07-16 DIAGNOSIS — M5416 Radiculopathy, lumbar region: Secondary | ICD-10-CM | POA: Insufficient documentation

## 2016-09-24 DIAGNOSIS — R079 Chest pain, unspecified: Secondary | ICD-10-CM | POA: Insufficient documentation

## 2016-09-24 DIAGNOSIS — J40 Bronchitis, not specified as acute or chronic: Secondary | ICD-10-CM | POA: Insufficient documentation

## 2016-09-24 DIAGNOSIS — D72829 Elevated white blood cell count, unspecified: Secondary | ICD-10-CM | POA: Insufficient documentation

## 2017-06-25 ENCOUNTER — Encounter: Payer: Self-pay | Admitting: Internal Medicine

## 2017-06-25 ENCOUNTER — Ambulatory Visit: Payer: Self-pay | Attending: Internal Medicine | Admitting: Internal Medicine

## 2017-06-25 VITALS — BP 120/90 | HR 100 | Temp 98.7°F | Resp 18 | Ht 63.0 in | Wt 265.8 lb

## 2017-06-25 DIAGNOSIS — Z801 Family history of malignant neoplasm of trachea, bronchus and lung: Secondary | ICD-10-CM | POA: Insufficient documentation

## 2017-06-25 DIAGNOSIS — I10 Essential (primary) hypertension: Secondary | ICD-10-CM

## 2017-06-25 DIAGNOSIS — E66813 Obesity, class 3: Secondary | ICD-10-CM

## 2017-06-25 DIAGNOSIS — F320 Major depressive disorder, single episode, mild: Secondary | ICD-10-CM | POA: Insufficient documentation

## 2017-06-25 DIAGNOSIS — R7303 Prediabetes: Secondary | ICD-10-CM | POA: Insufficient documentation

## 2017-06-25 DIAGNOSIS — M1712 Unilateral primary osteoarthritis, left knee: Secondary | ICD-10-CM | POA: Insufficient documentation

## 2017-06-25 DIAGNOSIS — M1611 Unilateral primary osteoarthritis, right hip: Secondary | ICD-10-CM | POA: Insufficient documentation

## 2017-06-25 DIAGNOSIS — M1612 Unilateral primary osteoarthritis, left hip: Secondary | ICD-10-CM

## 2017-06-25 DIAGNOSIS — Z6841 Body Mass Index (BMI) 40.0 and over, adult: Secondary | ICD-10-CM | POA: Insufficient documentation

## 2017-06-25 DIAGNOSIS — Z803 Family history of malignant neoplasm of breast: Secondary | ICD-10-CM | POA: Insufficient documentation

## 2017-06-25 DIAGNOSIS — F17210 Nicotine dependence, cigarettes, uncomplicated: Secondary | ICD-10-CM | POA: Insufficient documentation

## 2017-06-25 DIAGNOSIS — G4733 Obstructive sleep apnea (adult) (pediatric): Secondary | ICD-10-CM

## 2017-06-25 DIAGNOSIS — Z720 Tobacco use: Secondary | ICD-10-CM

## 2017-06-25 DIAGNOSIS — Z833 Family history of diabetes mellitus: Secondary | ICD-10-CM | POA: Insufficient documentation

## 2017-06-25 DIAGNOSIS — Z8249 Family history of ischemic heart disease and other diseases of the circulatory system: Secondary | ICD-10-CM | POA: Insufficient documentation

## 2017-06-25 DIAGNOSIS — F329 Major depressive disorder, single episode, unspecified: Secondary | ICD-10-CM | POA: Insufficient documentation

## 2017-06-25 DIAGNOSIS — F32A Depression, unspecified: Secondary | ICD-10-CM

## 2017-06-25 DIAGNOSIS — Z9989 Dependence on other enabling machines and devices: Secondary | ICD-10-CM

## 2017-06-25 DIAGNOSIS — M1711 Unilateral primary osteoarthritis, right knee: Secondary | ICD-10-CM

## 2017-06-25 LAB — POCT GLYCOSYLATED HEMOGLOBIN (HGB A1C): Hemoglobin A1C: 6.1

## 2017-06-25 LAB — GLUCOSE, POCT (MANUAL RESULT ENTRY): POC Glucose: 91 mg/dl (ref 70–99)

## 2017-06-25 MED ORDER — TRAMADOL HCL 50 MG PO TABS: 50.0000 mg | ORAL_TABLET | Freq: Three times a day (TID) | ORAL | 1 refills | Status: DC | PRN

## 2017-06-25 MED ORDER — SERTRALINE HCL 100 MG PO TABS: 100.0000 mg | ORAL_TABLET | Freq: Every day | ORAL | 3 refills | Status: DC

## 2017-06-25 MED ORDER — HYDROCHLOROTHIAZIDE 25 MG PO TABS: 25.0000 mg | ORAL_TABLET | Freq: Every day | ORAL | 3 refills | Status: DC

## 2017-06-25 MED ORDER — MELOXICAM 15 MG PO TABS: 15.0000 mg | ORAL_TABLET | Freq: Every day | ORAL | 6 refills | Status: DC

## 2017-06-25 MED ORDER — LOSARTAN POTASSIUM 50 MG PO TABS: 50.0000 mg | ORAL_TABLET | Freq: Every day | ORAL | 6 refills | Status: DC

## 2017-06-25 MED FILL — MELOXICAM 15 MG TABLET: 15 | 30 days supply | Qty: 30 | Fill #0

## 2017-06-25 MED FILL — HYDROCHLOROTHIAZIDE 25 MG T: 25 | 30 days supply | Qty: 30 | Fill #0

## 2017-06-25 MED FILL — LOSARTAN POTASSIUM 50 MG TA: 50 | 30 days supply | Qty: 30 | Fill #0

## 2017-06-25 MED FILL — traMADol HCL 50 MG TABS: 50 | 30 days supply | Qty: 90 | Fill #0

## 2017-06-25 MED FILL — SERTRALINE HCL 100 MG TAB: 100 | 30 days supply | Qty: 30 | Fill #0

## 2017-06-25 NOTE — Progress Notes (Signed)
Patient ID: Sarah Randall, female    DOB: 08-Aug-1966  MRN: [id number]  CC: New Patient (Initial Visit)   Subjective: Sarah Randall is a 51 y.o. female who presents for new pt visit. Previous PCP was at [facility] with [facility], Dr. Liane Comber. Pt recently moved to [place]. Her concerns today include:  Pt with hx of HTN,pre- DM, tob abuse, OSA has CPAP but not using, obesity,GERD, DDDof lumbar spine, OA RT hips and LT knee   1. HTN: on HCTZ 25mg  and Losartan 50 mg daily.  Needs RF -limits salt in foods -no CP or SOB/LE edema.    2. Depression: on Zoloft 75 mg daily does not feel it helps. On meds x 6 mths -pain, inability to work and financial issues contributes to depression. Out of work since 11/2016. 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   3. OA LT hip and RT knee -followed by [facility] ortho Has been tried with Celebrex, Tramadol, Neurontin but nothing seems to help -had steroid injection to LT hip last yr which helped for 3 mths.  Again  in 04/2017. Helped for about 2 wks. -X-ray reports reviewed on Care Everywhere reveals a very severe osteoarthritis -needs LT hip and RT knee replacement.  -However patient advised that she will have to get her weight down to 225 pounds before they would consider surgery. Patient feels this is a struggle because she is not able to do much in terms of exercise  4. Obesity:  -trying to get wgh down. "I left the sodas alone and I'm eating more fruits and stuff." -not able to do much exercise due to signifcant arthritis pain Can walk about 200 ft before she has to sit down -has to do house chores a little at at time -uses mobile charts at grocery store -Pain also disturbs her sleep. Uses a heating pad on the lower back and left hip often during the nights  5. Tob abuse: smoked for 20  yrs.  Was at 1/2 pk; now 5 cig a day. Trying to quit.  -has not tried NRT in past - Drinks occasionally. Denies street drug use No current outpatient prescriptions on file prior to visit.   No current facility-administered medications on file prior to visit.     No Known Allergies  Social History   Social History  . Marital status: Single    Spouse name: N/A  . Number of children: 2  . Years of education: 9   Occupational History  . unemployed    Social History Main Topics  . Smoking status: Current Some Day Smoker    Packs/day: 0.50    Types: Cigarettes  . Smokeless tobacco: Never Used  . Alcohol use Yes     Comment: occasional  . Drug use: No  . Sexual activity: Yes    Birth control/ protection: None   Other Topics Concern  . Not on file   Social History Narrative  . No narrative on file    Family History  Problem Relation Age of Onset  . Hypertension Mother   . Diabetes Mother   . Hypertension Father   . Breast cancer Maternal Aunt   . Lung cancer Maternal Aunt     Past Surgical History:  Procedure Laterality Date  . TUBAL LIGATION  ROS: Review of Systems  Constitutional: Negative for fever.  HENT: Negative for congestion.   Eyes: Negative for visual disturbance.  Respiratory: Negative for cough, chest tightness and shortness of breath.   Cardiovascular: Negative for chest pain.  Gastrointestinal: Negative for abdominal pain.  Genitourinary: Negative for difficulty urinating.  Psychiatric/Behavioral: Positive for dysphoric mood.    PHYSICAL EXAM: BP 120/90   Pulse 100   Temp 98.7 F (37.1 C) (Oral)   Resp 18   Ht 5\' 3"  (1.6 m)   Wt 265 lb 12.8 oz (120.6 kg)   LMP 06/06/2017 (Exact Date)   SpO2 97%   BMI 47.08 kg/m   120/90 Physical Exam General appearance - alert, well appearing, obese African-American female in NAD Mental status - alert, oriented to person, place, and time, normal mood, behavior, speech, dress, motor activity,  and thought processes Eyes - pupils equal and reactive, extraocular eye movements intact Mouth - mucous membranes moist, pharynx normal without lesions Neck - supple, no significant adenopathy Chest - clear to auscultation, no wheezes, rales or rhonchi, symmetric air entry Heart - normal rate, regular rhythm, normal S1, S2, no murmurs, rubs, clicks or gallops Abdomen - soft, nontender, nondistended, no masses or organomegaly Musculoskeletal - ambulates with a cane. Knees: Large body habitus. No point tenderness. Mild crepitus on passive movement.  Left hip: Moderate discomfort with passive internal and external rotation Extremities - no lower extremity edema    Results for orders placed or performed in visit on 06/25/17  POCT glucose (manual entry)  Result Value Ref Range   POC Glucose 91 70 - 99 mg/dl  POCT glycosylated hemoglobin (Hb A1C)  Result Value Ref Range   Hemoglobin A1C 6.1      ASSESSMENT AND PLAN: 1. Primary osteoarthritis of left hip 2. Primary osteoarthritis of right knee -Patient with severe quality-of-life limiting arthritis. Encourage her in trying to get her weight down. She does not have access to a pool to do water aerobics. -Advised to sign up for the [id number] card/cone discount at which point we would be able to refer her to orthopedics -In the meantime we will start her on meloxicam and tramadol. [place] control substances reporting system databank reviewed. She had one prescription for tramadol back in April for 21 pills from her primary doctor at the time. We had her do pain management agreement today. At that point patient admitted to using marijuana a few times a week. She denies any other substance use. We will do the urine drug screen. I informed her that moving forward on future random urine drug screen checks if she is positive for marijuana we may have to discontinue the controlled substance. She expressed understanding - meloxicam (MOBIC) 15 MG  tablet; Take 1 tablet (15 mg total) by mouth daily.  Dispense: 30 tablet; Refill: 6 - traMADol (ULTRAM) 50 MG tablet; Take 1 tablet (50 mg total) by mouth every 8 (eight) hours as needed.  Dispense: 90 tablet; Refill: 1 - Drug Screen, Urine  3. Depression, unspecified depression type -Patient agreeable to increased dose of Zoloft.  - sertraline (ZOLOFT) 100 MG tablet; Take 1 tablet (100 mg total) by mouth daily.  Dispense: 30 tablet; Refill: 3  4. Essential hypertension - hydrochlorothiazide (HYDRODIURIL) 25 MG tablet; Take 1 tablet (25 mg total) by mouth daily.  Dispense: 90 tablet; Refill: 3 - losartan (COZAAR) 50 MG tablet; Take 1 tablet (50 mg total) by mouth daily.  Dispense: 30 tablet; Refill: 6 - CBC - Comprehensive  metabolic panel - Lipid panel  5. Class 3 severe obesity due to excess calories without serious comorbidity with body mass index (BMI) of 45.0 to 49.9 in adult [facility]) -Patient counseled extensively on dietary changes. -Advised to limit white carbohydrates, avoid sugary drinks and snacks and decreased portion size limits -Printed information given 6. Tobacco abuse Patient advised to quit smoking. Discussed health risks associated with smoking including lung and other types of cancers, chronic lung diseases and CV risks.. Pt ready to give trail of quitting.  Discussed methods to help quit including quitting cold [place], use of NRT, Chantix and Bupropion. She will call [phone] quit now to requests free patches   7. Prediabetes See #5 above - POCT glucose (manual entry) - POCT glycosylated hemoglobin (Hb A1C)  8. OSA Advise patient to try wearing her CPAP a few hours a day while awake for several weeks to get used to the feel of the mask and hopefully increase compliance of use at bedtime  Patient was given the opportunity to ask questions.  Patient verbalized understanding of the plan and was able to repeat key elements of the plan.   Orders Placed This Encounter    Procedures  . CBC  . Comprehensive metabolic panel  . Lipid panel  . Drug Screen, Urine  . POCT glucose (manual entry)  . POCT glycosylated hemoglobin (Hb A1C)     Requested Prescriptions   Signed Prescriptions Disp Refills  . hydrochlorothiazide (HYDRODIURIL) 25 MG tablet 90 tablet 3    Sig: Take 1 tablet (25 mg total) by mouth daily.  Marland Kitchen losartan (COZAAR) 50 MG tablet 30 tablet 6    Sig: Take 1 tablet (50 mg total) by mouth daily.  . meloxicam (MOBIC) 15 MG tablet 30 tablet 6    Sig: Take 1 tablet (15 mg total) by mouth daily.  . traMADol (ULTRAM) 50 MG tablet 90 tablet 1    Sig: Take 1 tablet (50 mg total) by mouth every 8 (eight) hours as needed.  . sertraline (ZOLOFT) 100 MG tablet 30 tablet 3    Sig: Take 1 tablet (100 mg total) by mouth daily.    Return in about 6 weeks (around 08/06/2017).  Karle Plumber, MD, FACP

## 2017-06-25 NOTE — Patient Instructions (Addendum)
Please give pt forms for [employer] and [employer].  Give appt with Ms. Boyd   Call 1-800-Quit NOW to request the nicotine patches.  Follow a Healthy Eating Plan - You can do it! Limit sugary drinks.  Avoid sodas, sweet tea, sport or energy drinks, or fruit drinks.  Drink water, lo-fat milk, or diet drinks. Limit snack foods.   Cut back on candy, cake, cookies, chips, ice cream.  These are a special treat, only in small amounts. Eat plenty of vegetables.  Especially dark green, red, and orange vegetables. Aim for at least 3 servings a day. More is better! Include fruit in your daily diet.  Whole fruit is much healthier than fruit juice! Limit "white" bread, "white" pasta, "white" rice.   Choose "100% whole grain" products, brown or wild rice. Avoid fatty meats. Try "Meatless Monday" and choose eggs or beans one day a week.  When eating meat, choose lean meats like chicken, [place], and fish.  Grill, broil, or bake meats instead of frying, and eat poultry without the skin. Eat less salt.  Avoid frozen pizzas, frozen dinners and salty foods.  Use seasonings other than salt in cooking.  This can help blood pressure and keep you from swelling Beer, wine and liquor have calories.  If you can safely drink alcohol, limit to 1 drink per day for women, 2 drinks for men

## 2017-06-26 LAB — COMPREHENSIVE METABOLIC PANEL
ALT: 18 IU/L (ref 0–32)
AST: 20 IU/L (ref 0–40)
Albumin/Globulin Ratio: 1.5 (ref 1.2–2.2)
Albumin: 4.6 g/dL (ref 3.5–5.5)
Alkaline Phosphatase: 83 IU/L (ref 39–117)
BUN/Creatinine Ratio: 11 (ref 9–23)
BUN: 11 mg/dL (ref 6–24)
Bilirubin Total: 0.3 mg/dL (ref 0.0–1.2)
CO2: 21 mmol/L (ref 20–29)
Calcium: 9.9 mg/dL (ref 8.7–10.2)
Chloride: 98 mmol/L (ref 96–106)
Creatinine, Ser: 1.03 mg/dL — ABNORMAL HIGH (ref 0.57–1.00)
GFR calc Af Amer: 73 mL/min/{1.73_m2} (ref >59)
GFR calc non Af Amer: 64 mL/min/{1.73_m2} (ref >59)
Globulin, Total: 3.1 g/dL (ref 1.5–4.5)
Glucose: 84 mg/dL (ref 65–99)
Potassium: 3.9 mmol/L (ref 3.5–5.2)
Sodium: 136 mmol/L (ref 134–144)
Total Protein: 7.7 g/dL (ref 6.0–8.5)

## 2017-06-26 LAB — CBC
Hematocrit: 48.5 % — ABNORMAL HIGH (ref 34.0–46.6)
Hemoglobin: 17 g/dL — ABNORMAL HIGH (ref 11.1–15.9)
MCH: 32.6 pg (ref 26.6–33.0)
MCHC: 35.1 g/dL (ref 31.5–35.7)
MCV: 93 fL (ref 79–97)
Platelets: 341 10*3/uL (ref 150–379)
RBC: 5.22 x10E6/uL (ref 3.77–5.28)
RDW: 14.1 % (ref 12.3–15.4)
WBC: 12.1 10*3/uL — ABNORMAL HIGH (ref 3.4–10.8)

## 2017-06-26 LAB — LIPID PANEL
Chol/HDL Ratio: 6.3 ratio — ABNORMAL HIGH (ref 0.0–4.4)
Cholesterol, Total: 250 mg/dL — ABNORMAL HIGH (ref 100–199)
HDL: 40 mg/dL (ref >39)
LDL Calculated: 158 mg/dL — ABNORMAL HIGH (ref 0–99)
Triglycerides: 262 mg/dL — ABNORMAL HIGH (ref 0–149)
VLDL Cholesterol Cal: 52 mg/dL — ABNORMAL HIGH (ref 5–40)

## 2017-06-30 ENCOUNTER — Other Ambulatory Visit: Payer: Self-pay | Admitting: Internal Medicine

## 2017-06-30 MED ORDER — ATORVASTATIN CALCIUM 10 MG PO TABS: 10.0000 mg | ORAL_TABLET | Freq: Every day | ORAL | 3 refills | Status: DC

## 2017-08-03 MED FILL — MELOXICAM 15 MG TABLET: 15 | 30 days supply | Qty: 30 | Fill #1

## 2017-08-03 MED FILL — LOSARTAN POTASSIUM 50 MG TA: 50 | 30 days supply | Qty: 30 | Fill #1

## 2017-08-10 ENCOUNTER — Ambulatory Visit: Payer: Self-pay | Attending: Internal Medicine

## 2017-08-10 ENCOUNTER — Ambulatory Visit: Payer: Self-pay | Attending: Internal Medicine | Admitting: Internal Medicine

## 2017-08-10 ENCOUNTER — Encounter: Payer: Self-pay | Admitting: Internal Medicine

## 2017-08-10 VITALS — BP 128/83 | HR 104 | Temp 98.9°F | Resp 16 | Wt 267.2 lb

## 2017-08-10 DIAGNOSIS — M1711 Unilateral primary osteoarthritis, right knee: Secondary | ICD-10-CM

## 2017-08-10 DIAGNOSIS — E785 Hyperlipidemia, unspecified: Secondary | ICD-10-CM

## 2017-08-10 DIAGNOSIS — R251 Tremor, unspecified: Secondary | ICD-10-CM | POA: Insufficient documentation

## 2017-08-10 DIAGNOSIS — Z9889 Other specified postprocedural states: Secondary | ICD-10-CM | POA: Insufficient documentation

## 2017-08-10 DIAGNOSIS — F17210 Nicotine dependence, cigarettes, uncomplicated: Secondary | ICD-10-CM | POA: Insufficient documentation

## 2017-08-10 DIAGNOSIS — I10 Essential (primary) hypertension: Secondary | ICD-10-CM | POA: Insufficient documentation

## 2017-08-10 DIAGNOSIS — G4733 Obstructive sleep apnea (adult) (pediatric): Secondary | ICD-10-CM

## 2017-08-10 DIAGNOSIS — D751 Secondary polycythemia: Secondary | ICD-10-CM | POA: Insufficient documentation

## 2017-08-10 DIAGNOSIS — F329 Major depressive disorder, single episode, unspecified: Secondary | ICD-10-CM | POA: Insufficient documentation

## 2017-08-10 DIAGNOSIS — E669 Obesity, unspecified: Secondary | ICD-10-CM | POA: Insufficient documentation

## 2017-08-10 DIAGNOSIS — F32A Depression, unspecified: Secondary | ICD-10-CM

## 2017-08-10 DIAGNOSIS — K219 Gastro-esophageal reflux disease without esophagitis: Secondary | ICD-10-CM | POA: Insufficient documentation

## 2017-08-10 DIAGNOSIS — M1612 Unilateral primary osteoarthritis, left hip: Secondary | ICD-10-CM

## 2017-08-10 DIAGNOSIS — Z23 Encounter for immunization: Secondary | ICD-10-CM

## 2017-08-10 DIAGNOSIS — E119 Type 2 diabetes mellitus without complications: Secondary | ICD-10-CM | POA: Insufficient documentation

## 2017-08-10 DIAGNOSIS — Z1211 Encounter for screening for malignant neoplasm of colon: Secondary | ICD-10-CM

## 2017-08-10 DIAGNOSIS — Z888 Allergy status to other drugs, medicaments and biological substances status: Secondary | ICD-10-CM | POA: Insufficient documentation

## 2017-08-10 DIAGNOSIS — N61 Mastitis without abscess: Secondary | ICD-10-CM

## 2017-08-10 DIAGNOSIS — Z79899 Other long term (current) drug therapy: Secondary | ICD-10-CM | POA: Insufficient documentation

## 2017-08-10 MED ORDER — BUPROPION HCL 100 MG PO TABS: 100.0000 mg | ORAL_TABLET | Freq: Two times a day (BID) | ORAL | 3 refills | Status: DC

## 2017-08-10 MED ORDER — SULFAMETHOXAZOLE-TRIMETHOPRIM 800-160 MG PO TABS: 1.0000 | ORAL_TABLET | Freq: Two times a day (BID) | ORAL | 0 refills | Status: DC

## 2017-08-10 MED ORDER — PRAVASTATIN SODIUM 20 MG PO TABS: ORAL_TABLET | ORAL | 3 refills | Status: DC

## 2017-08-10 MED FILL — PRAVASTATIN NA 20 MG TAB: 20 | 30 days supply | Qty: 12 | Fill #0

## 2017-08-10 MED FILL — buPROPion HCL 100 MG TABS: 100 | 30 days supply | Qty: 60 | Fill #0

## 2017-08-10 MED FILL — SULFAMETHOXAZOLE-TMP DS TAB: 800-160 | 7 days supply | Qty: 14 | Fill #0

## 2017-08-10 NOTE — Progress Notes (Signed)
Patient ID: KWEEN BACORN, female    DOB: 10/21/1966  MRN: [id number]  CC: Follow-up   Subjective: Sarah Randall is a 51 y.o. female who presents for chronic ds management. Last seen 06/2017 Her concerns today include:  Pt with hx of HTN,pre- DM, tob abuse, OSA has CPAP but not using, obesity,GERD, depression, obesity, DDDof lumbar spine, OA RT hips and LT knee   1. OA: She has forms and supporting documents for [facility] and [facility] discount.  -has form to request handicap sticker -taking Tramadol and Mobic  2. Has boil under LT breast x 1 wk -no drainage. It is sore -similar ones before in axilla, upper thigh and back  3. HL -lipid profile elev. I recommended Lipitor.  Reports having stabbing pains in leg when on it in past  4.  Depression: increase dose of Zoloft caused her to have "the shakes" like fine tremors. -stopped taking it about 1 mth ago.  -still smoking 5 cig a day  5. OSA: -polycythemia on recent CBC. -she has not been able to find CPAP machine.Misplaced it when she moved 1 yr ago.  Went back to her parents house to try to find it after last visit with me.   Patient Active Problem List   Diagnosis Date Noted  . Hyperlipidemia 08/10/2017  . Primary osteoarthritis of left hip 06/25/2017  . Primary osteoarthritis of right knee 06/25/2017  . Depression 06/25/2017  . Essential hypertension 06/25/2017  . Class 3 severe obesity due to excess calories without serious comorbidity with body mass index (BMI) of 45.0 to 49.9 in adult ([place]) 06/25/2017  . Tobacco abuse 06/25/2017  . Prediabetes 06/25/2017  . OSA on CPAP 06/25/2017     Current Outpatient Prescriptions on File Prior to Visit  Medication Sig Dispense Refill  . hydrochlorothiazide (HYDRODIURIL) 25 MG tablet Take 1 tablet (25 mg total) by mouth daily. 90 tablet 3  . losartan (COZAAR) 50 MG tablet Take 1 tablet (50 mg total) by mouth daily. 30 tablet 6  . meloxicam (MOBIC) 15 MG tablet Take 1 tablet (15 mg total)  by mouth daily. 30 tablet 6  . traMADol (ULTRAM) 50 MG tablet Take 1 tablet (50 mg total) by mouth every 8 (eight) hours as needed. 90 tablet 1   No current facility-administered medications on file prior to visit.     Allergies  Allergen Reactions  . Lipitor [Atorvastatin]     Stabbing pains in legs  . Zoloft [Sertraline Hcl]     tremors    Social History   Social History  . Marital status: Single    Spouse name: N/A  . Number of children: 2  . Years of education: 9   Occupational History  . unemployed    Social History Main Topics  . Smoking status: Current Some Day Smoker    Packs/day: 0.50    Types: Cigarettes  . Smokeless tobacco: Never Used  . Alcohol use Yes     Comment: occasional  . Drug use: No  . Sexual activity: Yes    Birth control/ protection: None   Other Topics Concern  . Not on file   Social History Narrative  . No narrative on file    Family History  Problem Relation Age of Onset  . Hypertension Mother   . Diabetes Mother   . Hypertension Father   . Breast cancer Maternal Aunt   . Lung cancer Maternal Aunt     Past Surgical History:  Procedure Laterality  Date  . TUBAL LIGATION      ROS: Review of Systems Neg except as stated above  PHYSICAL EXAM: BP 128/83   Pulse (!) 104   Temp 98.9 F (37.2 C) (Oral)   Resp 16   Wt 267 lb 3.2 oz (121.2 kg)   SpO2 98%   BMI 47.33 kg/m   Physical Exam General appearance - alert, well appearing, obese female  and in no distress Mental status - alert, oriented to person, place, and time, normal mood, behavior, speech, dress, motor activity, and thought processes Chest - clear to auscultation, no wheezes, rales or rhonchi, symmetric air entry Heart - normal rate, regular rhythm, normal S1, S2, no murmurs, rubs, clicks or gallops LT Breasts -2.5x2 cm mildly erythematous firm raised area inner lower to mid quadrant.  Surrounding erythema, whelp like extends 5 cm from it.  No fluctuance. Slight  tenderness  Extremities - no LE edema. Ambulates with a cane  Results for orders placed or performed in visit on 06/25/17  CBC  Result Value Ref Range   WBC 12.1 (H) 3.4 - 10.8 x10E3/uL   RBC 5.22 3.77 - 5.28 x10E6/uL   Hemoglobin 17.0 (H) 11.1 - 15.9 g/dL   Hematocrit 48.5 (H) 34.0 - 46.6 %   MCV 93 79 - 97 fL   MCH 32.6 26.6 - 33.0 pg   MCHC 35.1 31.5 - 35.7 g/dL   RDW 14.1 12.3 - 15.4 %   Platelets 341 150 - 379 x10E3/uL  Comprehensive metabolic panel  Result Value Ref Range   Glucose 84 65 - 99 mg/dL   BUN 11 6 - 24 mg/dL   Creatinine, Ser 1.03 (H) 0.57 - 1.00 mg/dL   GFR calc non Af Amer 64 >59 mL/min/1.73   GFR calc Af Amer 73 >59 mL/min/1.73   BUN/Creatinine Ratio 11 9 - 23   Sodium 136 134 - 144 mmol/L   Potassium 3.9 3.5 - 5.2 mmol/L   Chloride 98 96 - 106 mmol/L   CO2 21 20 - 29 mmol/L   Calcium 9.9 8.7 - 10.2 mg/dL   Total Protein 7.7 6.0 - 8.5 g/dL   Albumin 4.6 3.5 - 5.5 g/dL   Globulin, Total 3.1 1.5 - 4.5 g/dL   Albumin/Globulin Ratio 1.5 1.2 - 2.2   Bilirubin Total 0.3 0.0 - 1.2 mg/dL   Alkaline Phosphatase 83 39 - 117 IU/L   AST 20 0 - 40 IU/L   ALT 18 0 - 32 IU/L  Lipid panel  Result Value Ref Range   Cholesterol, Total 250 (H) 100 - 199 mg/dL   Triglycerides 262 (H) 0 - 149 mg/dL   HDL 40 >39 mg/dL   VLDL Cholesterol Cal 52 (H) 5 - 40 mg/dL   LDL Calculated 158 (H) 0 - 99 mg/dL   Chol/HDL Ratio 6.3 (H) 0.0 - 4.4 ratio  POCT glucose (manual entry)  Result Value Ref Range   POC Glucose 91 70 - 99 mg/dl  POCT glycosylated hemoglobin (Hb A1C)  Result Value Ref Range   Hemoglobin A1C 6.1     ASSESSMENT AND PLAN: 1. Primary osteoarthritis of left hip -UDS today as requested on last visit. Pt on Tramadol.  Control Subst prescribing agreement updated on last visit -form completed for handicap sticker - [id number] 11+Oxyco+Alc+Crt-Bund - Ambulatory referral to Orthopedic Surgery  2. Primary osteoarthritis of right knee - Ambulatory referral to  Orthopedic Surgery  3. Depression, unspecified depression type D/c Zoloft. Tremor may have been interaction of  Zoloft with Tramadol Start Wellbutrin instead which will also help dec craving for cigarettes - buPROPion (WELLBUTRIN) 100 MG tablet; Take 1 tablet (100 mg total) by mouth 2 (two) times daily.  Dispense: 60 tablet; Refill: 3  4. Hyperlipidemia, unspecified hyperlipidemia type -stop Lipitor.  Try Pravachol.  Will have her start taking just 3 days a way and increase if tolerated - pravastatin (PRAVACHOL) 20 MG tablet; 1 tab Q Mon/Wed and Fri  Dispense: 30 tablet; Refill: 3  5. OSA (obstructive sleep apnea) -likely causing polycythemia - Nocturnal polysomnography (NPSG); Future  6. Cellulitis of left breast -abx x 1 wk. F/u in 2 wks to make certain it resolves - sulfamethoxazole-trimethoprim (BACTRIM DS,SEPTRA DS) 800-160 MG tablet; Take 1 tablet by mouth 2 (two) times daily.  Dispense: 14 tablet; Refill: 0  7. Need for influenza vaccination - Flu Vaccine QUAD 6+ mos PF IM (Fluarix Quad PF)  8. Colon cancer screening - Fecal occult blood, imunochemical  Patient was given the opportunity to ask questions.  Patient verbalized understanding of the plan and was able to repeat key elements of the plan.   Orders Placed This Encounter  Procedures  . Fecal occult blood, imunochemical  . Flu Vaccine QUAD 6+ mos PF IM (Fluarix Quad PF)  . [id number] 11+Oxyco+Alc+Crt-Bund  . Ambulatory referral to Orthopedic Surgery  . Nocturnal polysomnography (NPSG)     Requested Prescriptions   Signed Prescriptions Disp Refills  . pravastatin (PRAVACHOL) 20 MG tablet 30 tablet 3    Sig: 1 tab Q Mon/Wed and Fri  . sulfamethoxazole-trimethoprim (BACTRIM DS,SEPTRA DS) 800-160 MG tablet 14 tablet 0    Sig: Take 1 tablet by mouth 2 (two) times daily.  Marland Kitchen buPROPion (WELLBUTRIN) 100 MG tablet 60 tablet 3    Sig: Take 1 tablet (100 mg total) by mouth 2 (two) times daily.    Return in about 2 weeks  (around 08/24/2017).  Karle Plumber, MD, FACP

## 2017-08-10 NOTE — Patient Instructions (Signed)
Take the antibiotic as prescribed.  Follow up with me in 2 weeks to make sure this resolves.   Start Wellbutrin for depression.  Stop Lipitor. Start Pravachol instead.

## 2017-08-12 LAB — DRUG SCREEN 764883 11+OXYCO+ALC+CRT-BUND
Amphetamines, Urine: NEGATIVE ng/mL
BENZODIAZ UR QL: NEGATIVE ng/mL
Barbiturate: NEGATIVE ng/mL
Cannabinoid Quant, Ur: NEGATIVE ng/mL
Cocaine (Metabolite): NEGATIVE ng/mL
Creatinine: 150.1 mg/dL (ref 20.0–300.0)
Ethanol: NEGATIVE %
Meperidine: NEGATIVE ng/mL
Methadone Screen, Urine: NEGATIVE ng/mL
OPIATE SCREEN URINE: NEGATIVE ng/mL
Oxycodone/Oxymorphone, Urine: NEGATIVE ng/mL
Phencyclidine: NEGATIVE ng/mL
Propoxyphene: NEGATIVE ng/mL
pH, Urine: 5.4 (ref 4.5–8.9)

## 2017-08-12 LAB — TRAMADOL GC/MS, URINE
Tramadol gc/ms Conf: >3000 ng/mL
Tramadol: POSITIVE — AB

## 2017-08-24 ENCOUNTER — Ambulatory Visit (INDEPENDENT_AMBULATORY_CARE_PROVIDER_SITE_OTHER): Payer: Self-pay

## 2017-08-24 ENCOUNTER — Ambulatory Visit (INDEPENDENT_AMBULATORY_CARE_PROVIDER_SITE_OTHER): Payer: Self-pay | Admitting: Orthopaedic Surgery

## 2017-08-24 ENCOUNTER — Ambulatory Visit: Payer: Self-pay | Admitting: Internal Medicine

## 2017-08-24 DIAGNOSIS — G8929 Other chronic pain: Secondary | ICD-10-CM

## 2017-08-24 DIAGNOSIS — M25552 Pain in left hip: Secondary | ICD-10-CM

## 2017-08-24 DIAGNOSIS — M1711 Unilateral primary osteoarthritis, right knee: Secondary | ICD-10-CM

## 2017-08-24 DIAGNOSIS — M25561 Pain in right knee: Secondary | ICD-10-CM

## 2017-08-24 DIAGNOSIS — M1612 Unilateral primary osteoarthritis, left hip: Secondary | ICD-10-CM

## 2017-08-24 NOTE — Progress Notes (Signed)
Office Visit Note   Patient: Sarah Randall           Date of Birth: 01/05/1966           MRN: [id number] Visit Date: 08/24/2017              Requested by: Ladell Pier, MD [address] [place], [place] [address] PCP: Ladell Pier, MD   Assessment & Plan: Visit Diagnoses:  1. Chronic pain of right knee   2. Pain in left hip   3. Primary osteoarthritis of left hip   4. Primary osteoarthritis of right knee     Plan: Patient has advanced left hip and right knee degenerative joint disease. She seems to be suffering more from the hip and the knee. We discussed joint replacement surgery which has inherent risks of infection, dislocation, DVT, leg length discrepancy, incomplete relief of pain. With her increased BMI and tobacco use she understands that she is at higher risk for these complications. However she has exhausted all conservative treatment and has failed multiple attempts at weight loss. She is willing to accept the risks of surgery.   Total face to face encounter time was greater than 45 minutes and over half of this time was spent in counseling and/or coordination of care.  Follow-Up Instructions: Return if symptoms worsen or fail to improve.   Orders:  Orders Placed This Encounter  Procedures  . XR KNEE 3 VIEW RIGHT  . XR HIP UNILAT W OR W/O PELVIS 2-3 VIEWS LEFT   No orders of the defined types were placed in this encounter.     Procedures: No procedures performed   Clinical Data: No additional findings.   Subjective: Chief Complaint  Patient presents with  . Left Hip - Pain  . Right Knee - Pain    Patient is a 51 year old female comes in with chronic left hip and right knee pain for several years. She has had extensive conservative treatment that has given her temporary relief. She's had previous cortisone injections also. She is now ambulating with a cane. She continues to have a limp.  She has significant difficulty with ADLs. Denies any  numbness and tingling.    Review of Systems  Constitutional: Negative.   HENT: Negative.   Eyes: Negative.   Respiratory: Negative.   Cardiovascular: Negative.   Endocrine: Negative.   Musculoskeletal: Negative.   Neurological: Negative.   Hematological: Negative.   Psychiatric/Behavioral: Negative.   All other systems reviewed and are negative.    Objective: Vital Signs: There were no vitals taken for this visit.  Physical Exam  Constitutional: She is oriented to person, place, and time. She appears well-developed and well-nourished.  HENT:  Head: Normocephalic and atraumatic.  Eyes: EOM are normal.  Neck: Neck supple.  Pulmonary/Chest: Effort normal.  Abdominal: Soft.  Neurological: She is alert and oriented to person, place, and time.  Skin: Skin is warm. Capillary refill takes less than 2 seconds.  Psychiatric: She has a normal mood and affect. Her behavior is normal. Judgment and thought content normal.  Nursing note and vitals reviewed.   Ortho Exam Left hip exam shows significant pain with internal and external rotation. Positive Stinchfield sign. Lateral hip is nontender.   Right knee exam shows no joint effusion. Collaterals and cruciates are stable. Specialty Comments:  No specialty comments available.  Imaging: Xr Hip Unilat W Or W/o Pelvis 2-3 Views Left  Result Date: 08/24/2017 Degenerative joint disease left hip  Xr Knee 3 View Right  Result Date: 08/24/2017 Advanced right knee degenerative joint disease    PMFS History: Patient Active Problem List   Diagnosis Date Noted  . Hyperlipidemia 08/10/2017  . Primary osteoarthritis of left hip 06/25/2017  . Primary osteoarthritis of right knee 06/25/2017  . Depression 06/25/2017  . Essential hypertension 06/25/2017  . Class 3 severe obesity due to excess calories without serious comorbidity with body mass index (BMI) of 45.0 to 49.9 in adult ([place]) 06/25/2017  . Tobacco abuse 06/25/2017  .  Prediabetes 06/25/2017  . OSA on CPAP 06/25/2017   Past Medical History:  Diagnosis Date  . Diabetes mellitus without complication ([place])   . Hypertension     Family History  Problem Relation Age of Onset  . Hypertension Mother   . Diabetes Mother   . Hypertension Father   . Breast cancer Maternal Aunt   . Lung cancer Maternal Aunt     Past Surgical History:  Procedure Laterality Date  . TUBAL LIGATION     Social History   Occupational History  . unemployed    Social History Main Topics  . Smoking status: Current Some Day Smoker    Packs/day: 0.50    Types: Cigarettes  . Smokeless tobacco: Never Used  . Alcohol use Yes     Comment: occasional  . Drug use: No  . Sexual activity: Yes    Birth control/ protection: None

## 2017-08-30 ENCOUNTER — Ambulatory Visit: Payer: Self-pay | Attending: Internal Medicine | Admitting: Internal Medicine

## 2017-08-30 ENCOUNTER — Encounter: Payer: Self-pay | Admitting: Internal Medicine

## 2017-08-30 VITALS — BP 130/86 | HR 95 | Temp 98.7°F | Resp 18 | Ht 63.0 in | Wt 270.2 lb

## 2017-08-30 DIAGNOSIS — F329 Major depressive disorder, single episode, unspecified: Secondary | ICD-10-CM | POA: Insufficient documentation

## 2017-08-30 DIAGNOSIS — N61 Mastitis without abscess: Secondary | ICD-10-CM

## 2017-08-30 DIAGNOSIS — R7303 Prediabetes: Secondary | ICD-10-CM | POA: Insufficient documentation

## 2017-08-30 DIAGNOSIS — M1612 Unilateral primary osteoarthritis, left hip: Secondary | ICD-10-CM | POA: Insufficient documentation

## 2017-08-30 DIAGNOSIS — E785 Hyperlipidemia, unspecified: Secondary | ICD-10-CM | POA: Insufficient documentation

## 2017-08-30 DIAGNOSIS — M1711 Unilateral primary osteoarthritis, right knee: Secondary | ICD-10-CM | POA: Insufficient documentation

## 2017-08-30 DIAGNOSIS — F17210 Nicotine dependence, cigarettes, uncomplicated: Secondary | ICD-10-CM | POA: Insufficient documentation

## 2017-08-30 DIAGNOSIS — G4733 Obstructive sleep apnea (adult) (pediatric): Secondary | ICD-10-CM | POA: Insufficient documentation

## 2017-08-30 DIAGNOSIS — Z6841 Body Mass Index (BMI) 40.0 and over, adult: Secondary | ICD-10-CM | POA: Insufficient documentation

## 2017-08-30 DIAGNOSIS — I10 Essential (primary) hypertension: Secondary | ICD-10-CM | POA: Insufficient documentation

## 2017-08-30 LAB — GLUCOSE, POCT (MANUAL RESULT ENTRY): POC Glucose: 102 mg/dl — AB (ref 70–99)

## 2017-08-30 NOTE — Progress Notes (Signed)
Patient ID: Sarah Randall, female    DOB: 01/07/1966  MRN: [id number]  CC: LT breast cellulitis  Subjective: Sarah Randall is a 51 y.o. female who presents for LT breast cellulitis Her concerns today include:   1 Breast; She has completed antibiotics. Redness and swelling of the left breast has resolved.  2. Saw orthopedics since last visit.  Plan is to do a joint replacement first on the left hip then on the right knee in the future Scheduled for THR LT 09/22/2017 next mth.  Sleep study scheduled for 10/04/2017 Patient Active Problem List   Diagnosis Date Noted  . Hyperlipidemia 08/10/2017  . Primary osteoarthritis of left hip 06/25/2017  . Primary osteoarthritis of right knee 06/25/2017  . Depression 06/25/2017  . Essential hypertension 06/25/2017  . Class 3 severe obesity due to excess calories without serious comorbidity with body mass index (BMI) of 45.0 to 49.9 in adult ([place]) 06/25/2017  . Tobacco abuse 06/25/2017  . Prediabetes 06/25/2017  . OSA on CPAP 06/25/2017     Current Outpatient Prescriptions on File Prior to Visit  Medication Sig Dispense Refill  . buPROPion (WELLBUTRIN) 100 MG tablet Take 1 tablet (100 mg total) by mouth 2 (two) times daily. 60 tablet 3  . hydrochlorothiazide (HYDRODIURIL) 25 MG tablet Take 1 tablet (25 mg total) by mouth daily. 90 tablet 3  . losartan (COZAAR) 50 MG tablet Take 1 tablet (50 mg total) by mouth daily. 30 tablet 6  . meloxicam (MOBIC) 15 MG tablet Take 1 tablet (15 mg total) by mouth daily. 30 tablet 6  . pravastatin (PRAVACHOL) 20 MG tablet 1 tab Q Mon/Wed and Fri 30 tablet 3  . sulfamethoxazole-trimethoprim (BACTRIM DS,SEPTRA DS) 800-160 MG tablet Take 1 tablet by mouth 2 (two) times daily. 14 tablet 0  . traMADol (ULTRAM) 50 MG tablet Take 1 tablet (50 mg total) by mouth every 8 (eight) hours as needed. 90 tablet 1   No current facility-administered medications on file prior to visit.     Allergies  Allergen Reactions    . Lipitor [Atorvastatin]     Stabbing pains in legs  . Zoloft [Sertraline Hcl]     tremors    Social History   Social History  . Marital status: Single    Spouse name: N/A  . Number of children: 2  . Years of education: 9   Occupational History  . unemployed    Social History Main Topics  . Smoking status: Current Some Day Smoker    Packs/day: 0.50    Types: Cigarettes  . Smokeless tobacco: Never Used  . Alcohol use Yes     Comment: occasional  . Drug use: No  . Sexual activity: Yes    Birth control/ protection: None   Other Topics Concern  . Not on file   Social History Narrative  . No narrative on file    Family History  Problem Relation Age of Onset  . Hypertension Mother   . Diabetes Mother   . Hypertension Father   . Breast cancer Maternal Aunt   . Lung cancer Maternal Aunt     Past Surgical History:  Procedure Laterality Date  . TUBAL LIGATION      ROS: Review of Systems  PHYSICAL EXAM: BP 130/86 (BP Location: Left Arm, Patient Position: Sitting, Cuff Size: Normal)   Pulse 95   Temp 98.7 F (37.1 C) (Oral)   Resp 18   Ht 5\' 3"  (1.6 m)   Wt  270 lb 3.2 oz (122.6 kg)   LMP 08/16/2017 (Approximate)   SpO2 99%   BMI 47.86 kg/m   Physical Exam  General appearance - alert, well appearing, and in no distress Mental status - alert, oriented to person, place, and time, normal mood, behavior, speech, dress, motor activity, and thought processes Breasts - left: Swelling and redness have resolved   Results for orders placed or performed in visit on 08/30/17  Glucose (CBG)  Result Value Ref Range   POC Glucose 102 (A) 70 - 99 mg/dl    ASSESSMENT AND PLAN: 1. Cellulitis of left breast Resolved with antibiotics  2. Primary osteoarthritis of left hip -She has seen orthopedics and is scheduled for joint replacement next month. She will call and try to get her sleep study done prior to surgery if not will have to be done several weeks after  surgery  3. Prediabetes - Glucose (CBG)   Patient was given the opportunity to ask questions.  Patient verbalized understanding of the plan and was able to repeat key elements of the plan.   Orders Placed This Encounter  Procedures  . Glucose (CBG)     Requested Prescriptions    No prescriptions requested or ordered in this encounter    Return in about 3 months (around 11/29/2017).  Karle Plumber, MD, FACP

## 2017-08-31 MED FILL — traMADol HCL 50 MG TABS: 50 | 30 days supply | Qty: 90 | Fill #1

## 2017-09-09 ENCOUNTER — Other Ambulatory Visit (INDEPENDENT_AMBULATORY_CARE_PROVIDER_SITE_OTHER): Payer: Self-pay | Admitting: Orthopaedic Surgery

## 2017-09-09 DIAGNOSIS — M1612 Unilateral primary osteoarthritis, left hip: Secondary | ICD-10-CM

## 2017-09-10 NOTE — Pre-Procedure Instructions (Signed)
[place]  09/10/2017      [place], [address] [place] [address] Phone: [phone] Fax: [phone]    Your procedure is scheduled on September 22, 2017.  Report to [facility] Admitting at 1040 AM.  Call this number if you have problems the morning of surgery:  [phone]   Remember:  Do not eat food or drink liquids after midnight.  Take these medicines the morning of surgery with A SIP OF WATER bupropion (wellbutrin), tramadol (ultram)-if needed for pain.  7 days prior to surgery STOP taking any meloxicam (mobic),  Aspirin (unless otherwise instructed by your surgeon), Aleve, Naproxen, Ibuprofen, Motrin, Advil, Goody's, BC's, all herbal medications, fish oil, and all vitamins  Continue all other medications as instructed by your physician except follow the above medication instructions before surgery   Do not wear jewelry, make-up or nail polish.  Do not wear lotions, powders, or perfumes, or deoderant.  Do not shave 48 hours prior to surgery.    Do not bring valuables to the hospital.  [facility] is not responsible for any belongings or valuables.  Contacts, dentures or bridgework may not be worn into surgery.  Leave your suitcase in the car.  After surgery it may be brought to your room.  For patients admitted to the hospital, discharge time will be determined by your treatment team.  Patients discharged the day of surgery will not be allowed to drive home.   Special instructions:   [place]- Preparing For Surgery  Before surgery, you can play an important role. Because skin is not sterile, your skin needs to be as free of germs as possible. You can reduce the number of germs on your skin by washing with CHG (chlorahexidine gluconate) Soap before surgery.  CHG is an antiseptic cleaner which kills germs and bonds with the skin to continue killing germs even after  washing.  Please do not use if you have an allergy to CHG or antibacterial soaps. If your skin becomes reddened/irritated stop using the CHG.  Do not shave (including legs and underarms) for at least 48 hours prior to first CHG shower. It is OK to shave your face.  Please follow these instructions carefully.   1. Shower the NIGHT BEFORE SURGERY and the MORNING OF SURGERY with CHG.   2. If you chose to wash your hair, wash your hair first as usual with your normal shampoo.  3. After you shampoo, rinse your hair and body thoroughly to remove the shampoo.  4. Use CHG as you would any other liquid soap. You can apply CHG directly to the skin and wash gently with a scrungie or a clean washcloth.   5. Apply the CHG Soap to your body ONLY FROM THE NECK DOWN.  Do not use on open wounds or open sores. Avoid contact with your eyes, ears, mouth and genitals (private parts). Wash genitals (private parts) with your normal soap.  USE REGULAR SHAMPOO AND CONDITIONER FOR HAIR USE REGULAR SOAP FOR FACE AND PRIVATE AREA  6. Wash thoroughly, paying special attention to the area where your surgery will be performed.  7. Thoroughly rinse your body with warm water from the neck down.  8. DO NOT shower/wash with your normal soap after using and rinsing off the CHG Soap.  9. Pat yourself dry with a CLEAN TOWEL and [place] CLOTH  10. Wear CLEAN PAJAMAS to bed  the night before surgery, wear comfortable clothes the morning of surgery  11. Place CLEAN SHEETS on your bed the night of your first shower and DO NOT SLEEP WITH PETS.    Day of Surgery: Do not apply any deodorants/lotions. Please wear clean clothes to the hospital/surgery center.     Please read over the following fact sheets that you were given. Pain Booklet, Coughing and Deep Breathing, MRSA Information and Surgical Site Infection Prevention

## 2017-09-13 ENCOUNTER — Encounter (HOSPITAL_COMMUNITY): Payer: Self-pay

## 2017-09-13 ENCOUNTER — Encounter (HOSPITAL_COMMUNITY)
Admission: RE | Admit: 2017-09-13 | Discharge: 2017-09-13 | Disposition: A | Discharge status: Home / Self Care | Payer: Self-pay | Source: Ambulatory Visit | Attending: Orthopaedic Surgery | Admitting: Orthopaedic Surgery

## 2017-09-13 DIAGNOSIS — G4730 Sleep apnea, unspecified: Secondary | ICD-10-CM | POA: Insufficient documentation

## 2017-09-13 DIAGNOSIS — I498 Other specified cardiac arrhythmias: Secondary | ICD-10-CM | POA: Insufficient documentation

## 2017-09-13 DIAGNOSIS — K219 Gastro-esophageal reflux disease without esophagitis: Secondary | ICD-10-CM | POA: Insufficient documentation

## 2017-09-13 DIAGNOSIS — Z01812 Encounter for preprocedural laboratory examination: Secondary | ICD-10-CM | POA: Insufficient documentation

## 2017-09-13 DIAGNOSIS — E119 Type 2 diabetes mellitus without complications: Secondary | ICD-10-CM | POA: Insufficient documentation

## 2017-09-13 DIAGNOSIS — Z01810 Encounter for preprocedural cardiovascular examination: Secondary | ICD-10-CM | POA: Insufficient documentation

## 2017-09-13 DIAGNOSIS — I10 Essential (primary) hypertension: Secondary | ICD-10-CM | POA: Insufficient documentation

## 2017-09-13 DIAGNOSIS — M1612 Unilateral primary osteoarthritis, left hip: Secondary | ICD-10-CM | POA: Insufficient documentation

## 2017-09-13 HISTORY — DX: Sleep apnea, unspecified: G47.30

## 2017-09-13 HISTORY — DX: Unspecified osteoarthritis, unspecified site: M19.90

## 2017-09-13 HISTORY — DX: Gastro-esophageal reflux disease without esophagitis: K21.9

## 2017-09-13 LAB — CBC WITH DIFFERENTIAL/PLATELET
Basophils Absolute: 0.1 10*3/uL (ref 0.0–0.1)
Basophils Relative: 0 %
Eosinophils Absolute: 0.4 10*3/uL (ref 0.0–0.7)
Eosinophils Relative: 3 %
HCT: 44.8 % (ref 36.0–46.0)
Hemoglobin: 15.4 g/dL — ABNORMAL HIGH (ref 12.0–15.0)
Lymphocytes Relative: 35 %
Lymphs Abs: 4.7 10*3/uL — ABNORMAL HIGH (ref 0.7–4.0)
MCH: 31.7 pg (ref 26.0–34.0)
MCHC: 34.4 g/dL (ref 30.0–36.0)
MCV: 92.2 fL (ref 78.0–100.0)
Monocytes Absolute: 0.4 10*3/uL (ref 0.1–1.0)
Monocytes Relative: 3 %
Neutro Abs: 7.8 10*3/uL — ABNORMAL HIGH (ref 1.7–7.7)
Neutrophils Relative %: 59 %
Platelets: 332 10*3/uL (ref 150–400)
RBC: 4.86 MIL/uL (ref 3.87–5.11)
RDW: 13.2 % (ref 11.5–15.5)
WBC: 13.3 10*3/uL — ABNORMAL HIGH (ref 4.0–10.5)

## 2017-09-13 LAB — PROTIME-INR
INR: 0.97
Prothrombin Time: 12.8 seconds (ref 11.4–15.2)

## 2017-09-13 LAB — COMPREHENSIVE METABOLIC PANEL
ALT: 28 U/L (ref 14–54)
AST: 27 U/L (ref 15–41)
Albumin: 4.1 g/dL (ref 3.5–5.0)
Alkaline Phosphatase: 76 U/L (ref 38–126)
Anion gap: 11 (ref 5–15)
BUN: 10 mg/dL (ref 6–20)
CO2: 23 mmol/L (ref 22–32)
Calcium: 9 mg/dL (ref 8.9–10.3)
Chloride: 101 mmol/L (ref 101–111)
Creatinine, Ser: 0.95 mg/dL (ref 0.44–1.00)
GFR calc Af Amer: >60 mL/min (ref >60)
GFR calc non Af Amer: >60 mL/min (ref >60)
Glucose, Bld: 134 mg/dL — ABNORMAL HIGH (ref 65–99)
Potassium: 3.5 mmol/L (ref 3.5–5.1)
Sodium: 135 mmol/L (ref 135–145)
Total Bilirubin: 0.7 mg/dL (ref 0.3–1.2)
Total Protein: 7.4 g/dL (ref 6.5–8.1)

## 2017-09-13 LAB — HEMOGLOBIN A1C
Hgb A1c MFr Bld: 5.8 % — ABNORMAL HIGH (ref 4.8–5.6)
Mean Plasma Glucose: 119.76 mg/dL

## 2017-09-13 LAB — TYPE AND SCREEN
ABO/RH(D): O POS
Antibody Screen: NEGATIVE

## 2017-09-13 LAB — URINALYSIS, ROUTINE W REFLEX MICROSCOPIC
Bacteria, UA: NONE SEEN
Bilirubin Urine: NEGATIVE
Glucose, UA: NEGATIVE mg/dL
Ketones, ur: NEGATIVE mg/dL
Nitrite: NEGATIVE
Protein, ur: NEGATIVE mg/dL
Specific Gravity, Urine: 1.012 (ref 1.005–1.030)
pH: 7 (ref 5.0–8.0)

## 2017-09-13 LAB — ABO/RH: ABO/RH(D): O POS

## 2017-09-13 LAB — SEDIMENTATION RATE: Sed Rate: 7 mm/hr (ref 0–22)

## 2017-09-13 LAB — HCG, SERUM, QUALITATIVE: Preg, Serum: NEGATIVE

## 2017-09-13 LAB — GLUCOSE, CAPILLARY: Glucose-Capillary: 104 mg/dL — ABNORMAL HIGH (ref 65–99)

## 2017-09-13 LAB — SURGICAL PCR SCREEN
MRSA, PCR: NEGATIVE
Staphylococcus aureus: NEGATIVE

## 2017-09-13 LAB — APTT: aPTT: 23 seconds — ABNORMAL LOW (ref 24–36)

## 2017-09-13 LAB — C-REACTIVE PROTEIN: CRP: 0.9 mg/dL (ref <1.0)

## 2017-09-13 NOTE — Pre-Procedure Instructions (Signed)
[place]  09/13/2017      [place], [address] [place] [address] Phone: [phone] Fax: [phone]    Your procedure is scheduled on Wednesday  September 22, 2017.   Report to [facility] Admitting at 1040 AM.             (posted surgery time 12:41pm - 2:41pm)   Call this number if you have problems the MORNING of surgery:  [phone].   Remember:  Do not eat food or drink liquids after midnight Tuesday.   Take these medicines the morning of surgery with A SIP OF WATER bupropion (wellbutrin), tramadol (ultram)-if needed for pain.  7 days prior to surgery STOP taking any meloxicam (mobic),  Aspirin (unless otherwise instructed by your surgeon), Aleve, Naproxen, Ibuprofen, Motrin, Advil, Goody's, BC's, all herbal medications, fish oil, and all vitamins  Continue all other medications as instructed by your physician except follow the above medication instructions before surgery   Do not wear jewelry, make-up or nail polish.  Do not wear lotions, powders,  perfumes, or deoderant.  Do not shave 48 hours prior to surgery.     Do not bring valuables to the hospital.  [facility] is not responsible for any belongings or valuables.  Contacts, dentures or bridgework may not be worn into surgery.  Leave your suitcase in the car.  After surgery it may be brought to your room. For patients admitted to the hospital, discharge time will be determined by your treatment team.     Special instructions:   [place]- Preparing For Surgery  Before surgery, you can play an important role. Because skin is not sterile, your skin needs to be as free of germs as possible. You can reduce the number of germs on your skin by washing with CHG (chlorahexidine gluconate) Soap before surgery.  CHG is an antiseptic cleaner which kills germs and bonds with the skin to continue killing germs even after  washing.  Please do not use if you have an allergy to CHG or antibacterial soaps. If your skin becomes reddened/irritated stop using the CHG.  Do not shave (including legs and underarms) for at least 48 hours prior to first CHG shower. It is OK to shave your face.  Please follow these instructions carefully.   1. Shower the NIGHT BEFORE SURGERY and the MORNING OF SURGERY with CHG.   2. If you chose to wash your hair, wash your hair first as usual with your normal shampoo.  3. After you shampoo, rinse your hair and body thoroughly to remove the shampoo.  4. Use CHG as you would any other liquid soap. You can apply CHG directly to the skin and wash gently with a scrungie or a clean washcloth.   5. Apply the CHG Soap to your body ONLY FROM THE NECK DOWN.  Do not use on open wounds or open sores. Avoid contact with your eyes, ears, mouth and genitals (private parts). Wash genitals (private parts) with your normal soap.  USE REGULAR SHAMPOO AND CONDITIONER FOR HAIR USE REGULAR SOAP FOR FACE AND PRIVATE AREA  6. Wash thoroughly, paying special attention to the area where your surgery will be performed.  7. Thoroughly rinse your body with warm water from the neck down.  8. DO NOT shower/wash with your normal soap after using and rinsing off the CHG Soap.  9. Pat yourself dry with a  CLEAN TOWEL and [place] CLOTH  10. Wear CLEAN PAJAMAS to bed the night before surgery, wear comfortable clothes the morning of surgery  11. Place CLEAN SHEETS on your bed the night of your first shower and DO NOT SLEEP WITH PETS.    Day of Surgery: Do not apply any deodorants/lotions. Please wear clean clothes to the hospital/surgery center.     Please read over the following fact sheets that you were given. Pain Booklet, Coughing and Deep Breathing, MRSA Information and Surgical Site Infection Prevention

## 2017-09-13 NOTE — Progress Notes (Addendum)
PCP is Dr. Etheleen Mayhew at [facility] 08/2017 Had Stress treadmill/nucleur med study 09/2016 at [facility] - negative results per the patient. (results in care everywhere) States she's been told she's diabetic.  A1C was 6.1 06/2017  She takes no meds Was tested for OSA back in 2011. (unable to locate that test result)  She was unable to tolerate the mask, and is scheduled to be re-tested Oct 19, 2017

## 2017-09-14 MED FILL — LOSARTAN POTASSIUM 50 MG TA: 50 | 30 days supply | Qty: 30 | Fill #2

## 2017-09-14 MED FILL — PRAVASTATIN NA 20 MG TAB: 20 | 30 days supply | Qty: 12 | Fill #1

## 2017-09-14 MED FILL — HYDROCHLOROTHIAZIDE 25 MG T: 25 | 30 days supply | Qty: 30 | Fill #1

## 2017-09-14 NOTE — Progress Notes (Signed)
Anesthesia Chart Review: Patient is a 51 year old female scheduled for left THA anterior approach on 09/22/17 by Dr. Frankey Shown.  History includes smoking, HTN, DM2 (diet controlled), OSA (intolerant to CPAP), GERD, arthritis, tubal ligation.  BMI is consistent with morbid obesity.   PCP is listed as Dr. Karle Plumber ([place]). Last visit 08/30/17. She is aware of surgery plans. Left breast cellulitis had resolved following antibiotics. She is for repeat sleep study 10/04/17.   Meds include Wellbutrin, HCTZ, losartan, pravastatin, tramadol.   BP (!) 146/103   Pulse 99   Temp 36.9 C (Oral)   Resp 18   Ht 5' 3.5" (1.613 m)   Wt 270 lb 3 oz (122.6 kg)   LMP 08/16/2017 (Approximate)   SpO2 100%   BMI 47.11 kg/m  BP was not rechecked at PAT. BP on 08/30/17 was 130/86.   EKG 09/13/17: NSR with sinus arrhythmia.  Nuclear stress test 09/24/16 ([place]; Care Everywhere; done during overnight admission for chest pain at [facility]): Result Impression: 1. No reversible ischemia or infarction. 2. Normal left ventricular wall motion. 3. Left ventricular ejection fraction 62%. 4. Non invasive risk stratification*: Low  Preoperative labs noted. A1c 5.8. WBC 13.3. H/H 15.4/44.8. Cr 0.95. PT/INR 12.8/0.97. PTT 23. Glucose 134. Serum pregnancy test negative.   Patient had elevated DBP at PAT. It doesn't look like BP was rechecked. Her previous BP was 130/86. She will be vitals on arrival the day of surgery and if results acceptable and otherwise no acute changes then I anticipate that she can proceed as planned.   [facility] Short Stay Center/Anesthesiology Phone [phone] 09/14/2017 5:00 PM

## 2017-09-21 MED ORDER — TRANEXAMIC ACID 1000 MG/10ML IV SOLN
1000.0000 mg | INTRAVENOUS | Status: AC
  Administered 2017-09-22: 1000 mg via INTRAVENOUS
  Filled 2017-09-21: qty 1100

## 2017-09-21 MED ORDER — DEXTROSE 5 % IV SOLN
3.0000 g | INTRAVENOUS | Status: AC
  Administered 2017-09-22: 3 g via INTRAVENOUS
  Filled 2017-09-21: qty 3000

## 2017-09-22 ENCOUNTER — Inpatient Hospital Stay (HOSPITAL_COMMUNITY): Payer: Medicaid Other | Admitting: Certified Registered"

## 2017-09-22 ENCOUNTER — Inpatient Hospital Stay (HOSPITAL_COMMUNITY)
Admission: RE | Admit: 2017-09-22 | Discharge: 2017-09-24 | DRG: 470 | Disposition: A | Discharge status: Home / Self Care | Payer: Medicaid Other | Source: Ambulatory Visit | Attending: Orthopaedic Surgery | Admitting: Orthopaedic Surgery

## 2017-09-22 ENCOUNTER — Inpatient Hospital Stay (HOSPITAL_COMMUNITY): Payer: Medicaid Other

## 2017-09-22 ENCOUNTER — Inpatient Hospital Stay (HOSPITAL_COMMUNITY): Payer: Medicaid Other | Admitting: Vascular Surgery

## 2017-09-22 ENCOUNTER — Encounter (HOSPITAL_COMMUNITY)
Admission: RE | Disposition: A | Discharge status: Home / Self Care | Payer: Self-pay | Source: Ambulatory Visit | Attending: Orthopaedic Surgery

## 2017-09-22 ENCOUNTER — Encounter (HOSPITAL_COMMUNITY): Payer: Self-pay

## 2017-09-22 DIAGNOSIS — Z833 Family history of diabetes mellitus: Secondary | ICD-10-CM | POA: Diagnosis not present

## 2017-09-22 DIAGNOSIS — I10 Essential (primary) hypertension: Secondary | ICD-10-CM | POA: Diagnosis present

## 2017-09-22 DIAGNOSIS — Z79899 Other long term (current) drug therapy: Secondary | ICD-10-CM

## 2017-09-22 DIAGNOSIS — Z801 Family history of malignant neoplasm of trachea, bronchus and lung: Secondary | ICD-10-CM | POA: Diagnosis not present

## 2017-09-22 DIAGNOSIS — E119 Type 2 diabetes mellitus without complications: Secondary | ICD-10-CM | POA: Diagnosis present

## 2017-09-22 DIAGNOSIS — Z803 Family history of malignant neoplasm of breast: Secondary | ICD-10-CM

## 2017-09-22 DIAGNOSIS — F17210 Nicotine dependence, cigarettes, uncomplicated: Secondary | ICD-10-CM | POA: Diagnosis present

## 2017-09-22 DIAGNOSIS — M1612 Unilateral primary osteoarthritis, left hip: Principal | ICD-10-CM | POA: Diagnosis present

## 2017-09-22 DIAGNOSIS — Z888 Allergy status to other drugs, medicaments and biological substances status: Secondary | ICD-10-CM | POA: Diagnosis not present

## 2017-09-22 DIAGNOSIS — Z96642 Presence of left artificial hip joint: Secondary | ICD-10-CM

## 2017-09-22 DIAGNOSIS — Z9851 Tubal ligation status: Secondary | ICD-10-CM

## 2017-09-22 DIAGNOSIS — M1990 Unspecified osteoarthritis, unspecified site: Secondary | ICD-10-CM | POA: Diagnosis present

## 2017-09-22 DIAGNOSIS — G4730 Sleep apnea, unspecified: Secondary | ICD-10-CM | POA: Diagnosis present

## 2017-09-22 DIAGNOSIS — Z8249 Family history of ischemic heart disease and other diseases of the circulatory system: Secondary | ICD-10-CM | POA: Diagnosis not present

## 2017-09-22 DIAGNOSIS — Z96649 Presence of unspecified artificial hip joint: Secondary | ICD-10-CM

## 2017-09-22 DIAGNOSIS — Z419 Encounter for procedure for purposes other than remedying health state, unspecified: Secondary | ICD-10-CM

## 2017-09-22 DIAGNOSIS — K219 Gastro-esophageal reflux disease without esophagitis: Secondary | ICD-10-CM | POA: Diagnosis present

## 2017-09-22 HISTORY — PX: TOTAL HIP ARTHROPLASTY: SHX124

## 2017-09-22 LAB — GLUCOSE, CAPILLARY: Glucose-Capillary: 94 mg/dL (ref 65–99)

## 2017-09-22 IMAGING — RF DG HIP (WITH PELVIS) OPERATIVE*L*
1 series · 2 of 2 positions shown · non-contrast
Comparison: Left hip series of [DATE]

CLINICAL DATA: Status post anterior approach left hip arthroplasty.
Reported fluoro time is 31 seconds.

EXAM:
DG C-ARM 61-120 MIN; OPERATIVE LEFT HIP WITH PELVIS

[Series 1: run · 2 of 2 slices shown]
[im 1/2]
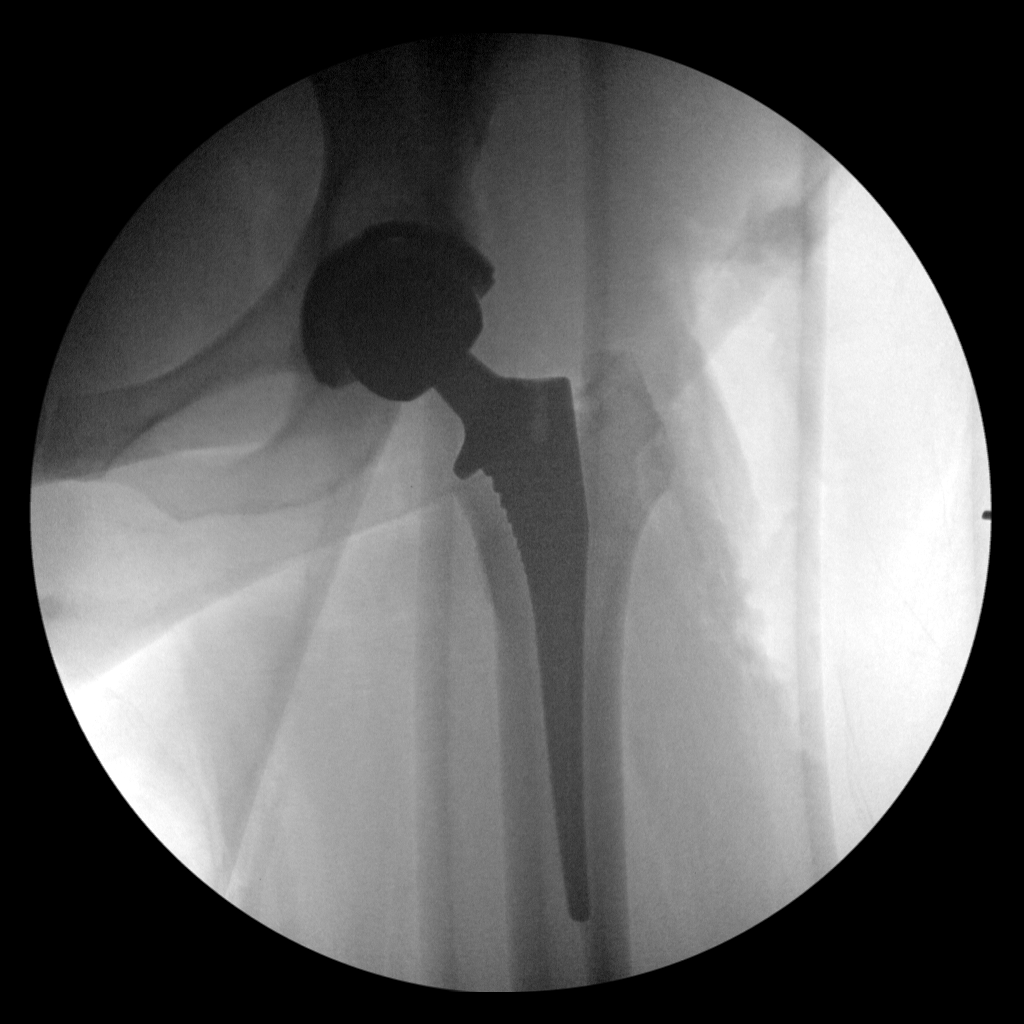
[im 2/2]
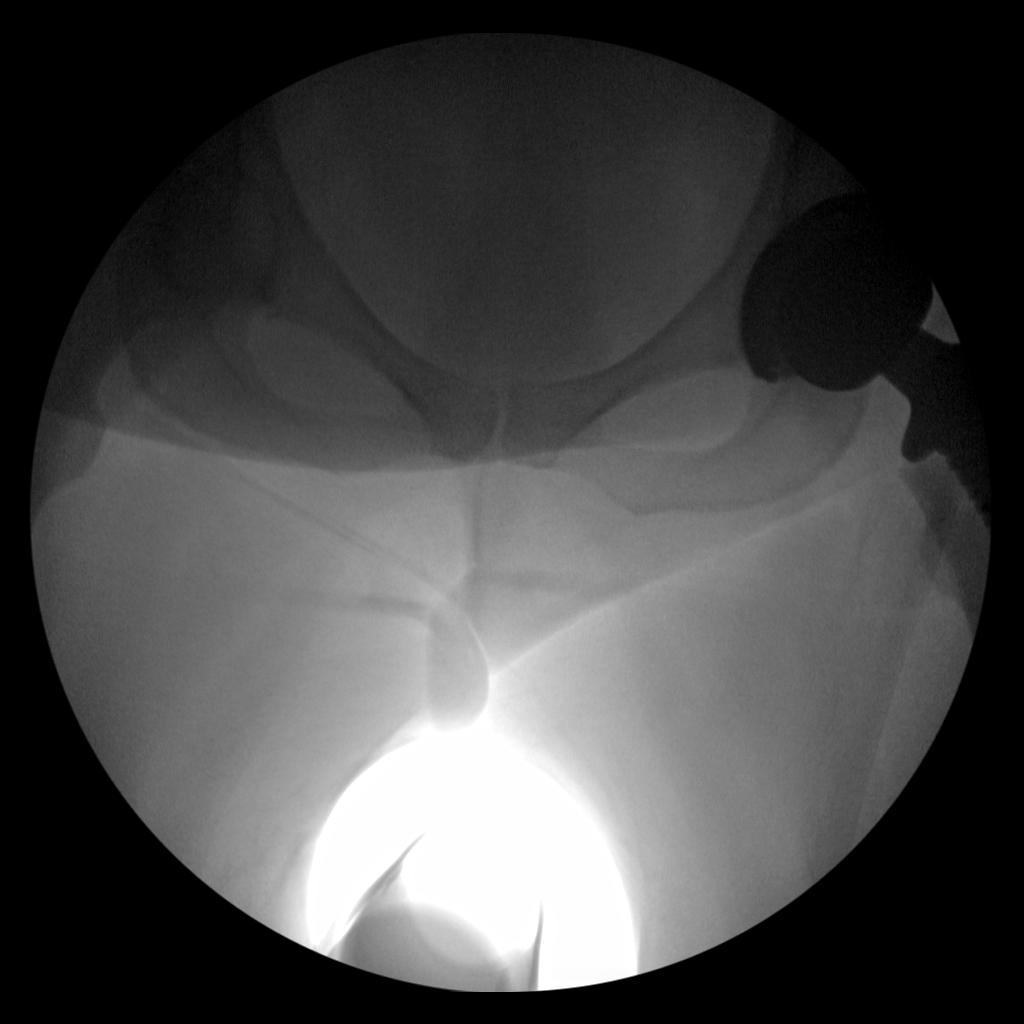

[2 of 2 positions shown; findings below may reference images not displayed]

FINDINGS: The patient has undergone left hip joint prosthesis placement.
Radiographic positioning of the prosthetic components is good. The
interface with the native bone appears normal.
IMPRESSION: There is no acute postprocedure complication following left hip
joint prosthesis placement.

## 2017-09-22 IMAGING — DX DG PORTABLE PELVIS
1 series · 1 of 1 positions shown · non-contrast
Comparison: [DATE]

CLINICAL DATA: Left hip replacement

EXAM:
PORTABLE PELVIS 1-2 VIEWS

[pelvis ap]
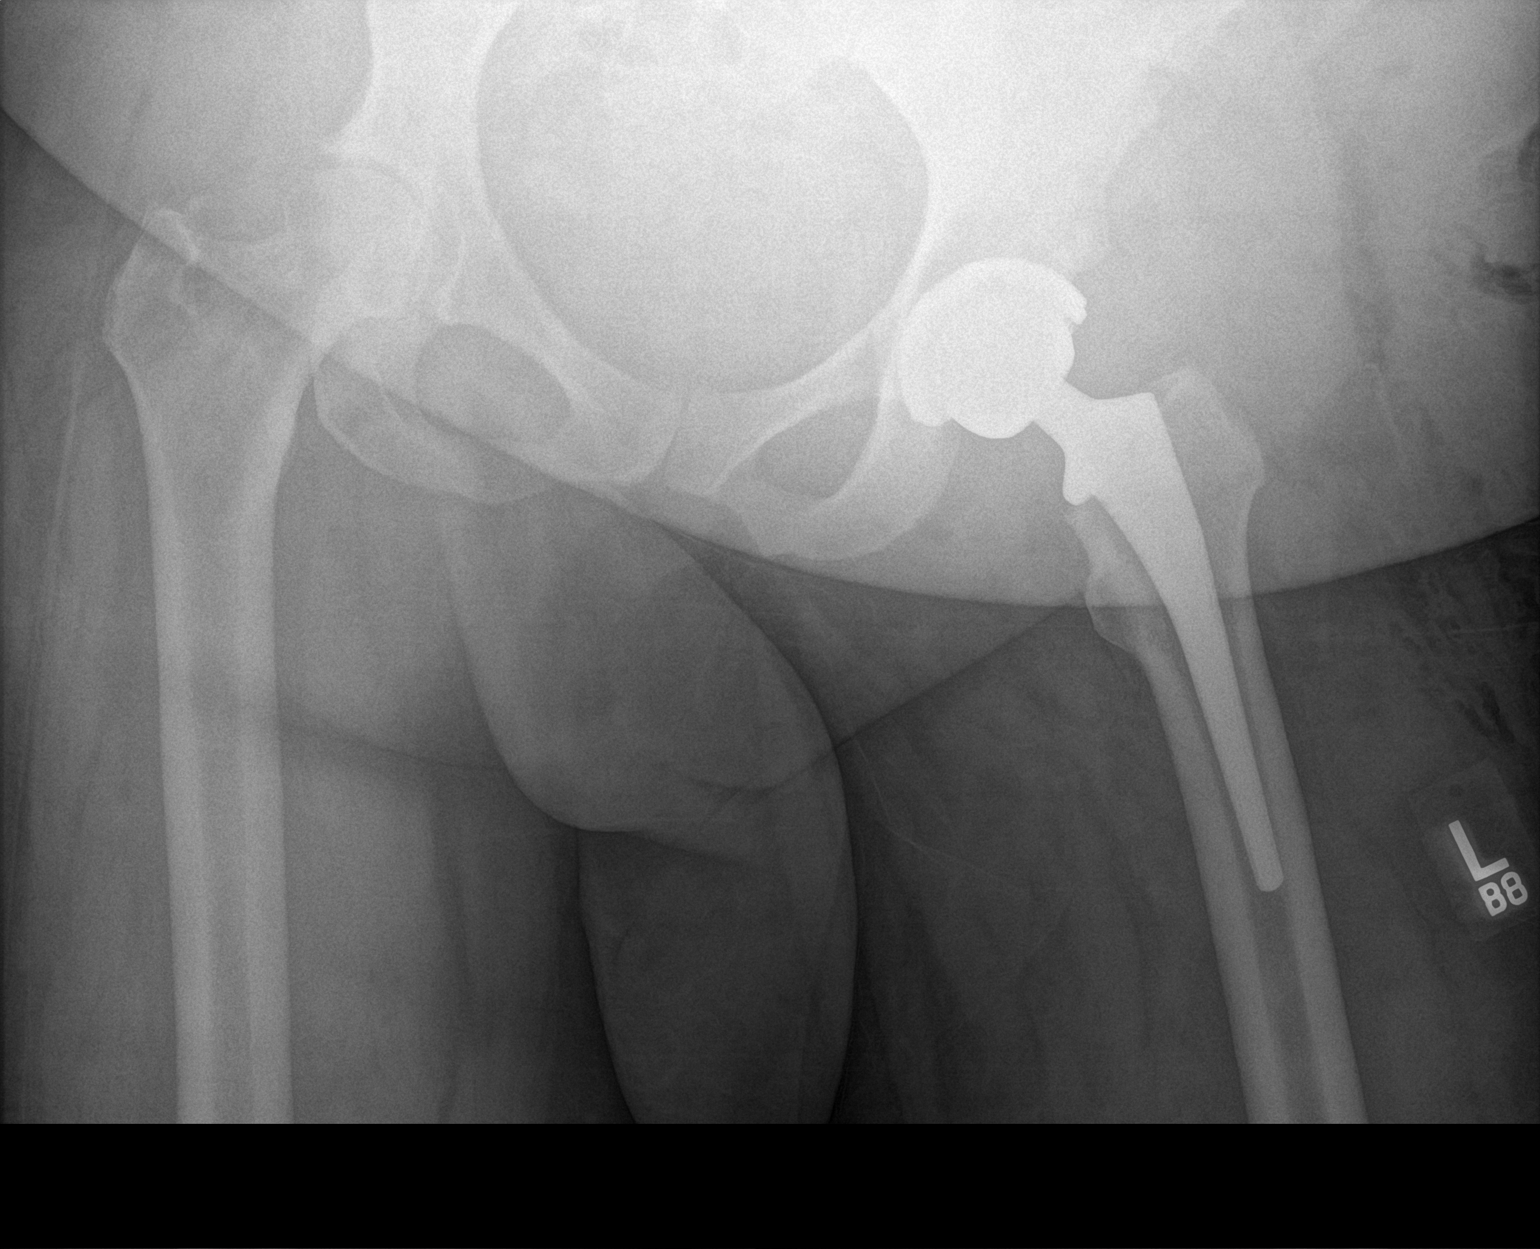

[1 of 1 positions shown; findings below may reference images not displayed]

FINDINGS: Left hip replacement in satisfactory position alignment. Negative
for fracture.
IMPRESSION: Satisfactory left hip replacement.

## 2017-09-22 SURGERY — ARTHROPLASTY, HIP, TOTAL, ANTERIOR APPROACH
Anesthesia: General | Site: Hip | Laterality: Left

## 2017-09-22 MED ORDER — 0.9 % SODIUM CHLORIDE (POUR BTL) OPTIME
TOPICAL | Status: DC | PRN
  Administered 2017-09-22: 1000 mL

## 2017-09-22 MED ORDER — ONDANSETRON HCL 4 MG/2ML IJ SOLN
4.0000 mg | Freq: Four times a day (QID) | INTRAMUSCULAR | Status: DC | PRN
  Administered 2017-09-22: 4 mg via INTRAVENOUS
  Filled 2017-09-22: qty 2

## 2017-09-22 MED ORDER — MAGNESIUM CITRATE PO SOLN: 1.0000 | Freq: Once | ORAL | Status: DC | PRN

## 2017-09-22 MED ORDER — DEXTROSE 5 % IV SOLN
3.0000 g | Freq: Three times a day (TID) | INTRAVENOUS | Status: AC
  Administered 2017-09-22 – 2017-09-23 (×3): 3 g via INTRAVENOUS
  Filled 2017-09-22 (×4): qty 3000

## 2017-09-22 MED ORDER — ONDANSETRON HCL 4 MG PO TABS: 4.0000 mg | ORAL_TABLET | Freq: Four times a day (QID) | ORAL | Status: DC | PRN

## 2017-09-22 MED ORDER — DEXAMETHASONE SODIUM PHOSPHATE 10 MG/ML IJ SOLN
INTRAMUSCULAR | Status: DC | PRN
  Administered 2017-09-22: 10 mg via INTRAVENOUS

## 2017-09-22 MED ORDER — ONDANSETRON HCL 4 MG/2ML IJ SOLN
INTRAMUSCULAR | Status: AC
  Filled 2017-09-22: qty 2

## 2017-09-22 MED ORDER — PROPOFOL 10 MG/ML IV BOLUS
INTRAVENOUS | Status: AC
  Filled 2017-09-22: qty 20

## 2017-09-22 MED ORDER — CHLORHEXIDINE GLUCONATE 4 % EX LIQD: 60.0000 mL | Freq: Once | CUTANEOUS | Status: DC

## 2017-09-22 MED ORDER — POLYETHYLENE GLYCOL 3350 17 G PO PACK
17.0000 g | PACK | Freq: Every day | ORAL | Status: DC | PRN
  Administered 2017-09-24: 17 g via ORAL
  Filled 2017-09-22: qty 1

## 2017-09-22 MED ORDER — MORPHINE SULFATE (PF) 4 MG/ML IV SOLN
1.0000 mg | INTRAVENOUS | Status: DC | PRN
  Filled 2017-09-22: qty 1

## 2017-09-22 MED ORDER — ACETAMINOPHEN 650 MG RE SUPP: 650.0000 mg | Freq: Four times a day (QID) | RECTAL | Status: DC | PRN

## 2017-09-22 MED ORDER — SULFAMETHOXAZOLE-TRIMETHOPRIM 800-160 MG PO TABS
1.0000 | ORAL_TABLET | Freq: Two times a day (BID) | ORAL | Status: DC
  Administered 2017-09-23 – 2017-09-24 (×3): 1 via ORAL
  Filled 2017-09-22 (×3): qty 1

## 2017-09-22 MED ORDER — FENTANYL CITRATE (PF) 100 MCG/2ML IJ SOLN
INTRAMUSCULAR | Status: AC
  Filled 2017-09-22: qty 2

## 2017-09-22 MED ORDER — METOCLOPRAMIDE HCL 5 MG PO TABS: 5.0000 mg | ORAL_TABLET | Freq: Three times a day (TID) | ORAL | Status: DC | PRN

## 2017-09-22 MED ORDER — ROCURONIUM BROMIDE 50 MG/5ML IV SOLN
INTRAVENOUS | Status: AC
  Filled 2017-09-22: qty 1

## 2017-09-22 MED ORDER — PRAVASTATIN SODIUM 20 MG PO TABS
20.0000 mg | ORAL_TABLET | ORAL | Status: DC
  Administered 2017-09-22 – 2017-09-24 (×2): 20 mg via ORAL
  Filled 2017-09-22 (×2): qty 1

## 2017-09-22 MED ORDER — SENNOSIDES-DOCUSATE SODIUM 8.6-50 MG PO TABS: 1.0000 | ORAL_TABLET | Freq: Every evening | ORAL | 1 refills | Status: DC | PRN

## 2017-09-22 MED ORDER — OXYCODONE HCL ER 10 MG PO T12A: 10.0000 mg | EXTENDED_RELEASE_TABLET | Freq: Two times a day (BID) | ORAL | 0 refills | Status: DC

## 2017-09-22 MED ORDER — FENTANYL CITRATE (PF) 100 MCG/2ML IJ SOLN
INTRAMUSCULAR | Status: DC | PRN
  Administered 2017-09-22 (×2): 50 ug via INTRAVENOUS
  Administered 2017-09-22 (×2): 100 ug via INTRAVENOUS

## 2017-09-22 MED ORDER — ONDANSETRON HCL 4 MG PO TABS: 4.0000 mg | ORAL_TABLET | Freq: Three times a day (TID) | ORAL | 0 refills | Status: DC | PRN

## 2017-09-22 MED ORDER — SORBITOL 70 % SOLN: 30.0000 mL | Freq: Every day | Status: DC | PRN

## 2017-09-22 MED ORDER — DEXAMETHASONE SODIUM PHOSPHATE 10 MG/ML IJ SOLN
INTRAMUSCULAR | Status: AC
  Filled 2017-09-22: qty 1

## 2017-09-22 MED ORDER — MENTHOL 3 MG MT LOZG: 1.0000 | LOZENGE | OROMUCOSAL | Status: DC | PRN

## 2017-09-22 MED ORDER — ASPIRIN EC 325 MG PO TBEC
325.0000 mg | DELAYED_RELEASE_TABLET | Freq: Two times a day (BID) | ORAL | Status: DC
  Administered 2017-09-22 – 2017-09-24 (×4): 325 mg via ORAL
  Filled 2017-09-22 (×4): qty 1

## 2017-09-22 MED ORDER — FENTANYL CITRATE (PF) 250 MCG/5ML IJ SOLN
INTRAMUSCULAR | Status: AC
  Filled 2017-09-22: qty 5

## 2017-09-22 MED ORDER — SODIUM CHLORIDE 0.9 % IR SOLN
Status: DC | PRN
  Administered 2017-09-22: 3000 mL

## 2017-09-22 MED ORDER — METHOCARBAMOL 750 MG PO TABS: 750.0000 mg | ORAL_TABLET | Freq: Two times a day (BID) | ORAL | 0 refills | Status: DC | PRN

## 2017-09-22 MED ORDER — METHOCARBAMOL 500 MG PO TABS
500.0000 mg | ORAL_TABLET | Freq: Four times a day (QID) | ORAL | Status: DC | PRN
  Administered 2017-09-22 – 2017-09-24 (×5): 500 mg via ORAL
  Filled 2017-09-22 (×4): qty 1

## 2017-09-22 MED ORDER — MIDAZOLAM HCL 2 MG/2ML IJ SOLN
INTRAMUSCULAR | Status: DC | PRN
  Administered 2017-09-22: 2 mg via INTRAVENOUS

## 2017-09-22 MED ORDER — ROCURONIUM BROMIDE 10 MG/ML (PF) SYRINGE
PREFILLED_SYRINGE | INTRAVENOUS | Status: DC | PRN
  Administered 2017-09-22: 50 mg via INTRAVENOUS
  Administered 2017-09-22: 10 mg via INTRAVENOUS

## 2017-09-22 MED ORDER — TRANEXAMIC ACID 1000 MG/10ML IV SOLN
1000.0000 mg | Freq: Once | INTRAVENOUS | Status: AC
  Administered 2017-09-22: 1000 mg via INTRAVENOUS
  Filled 2017-09-22: qty 10

## 2017-09-22 MED ORDER — KETOROLAC TROMETHAMINE 15 MG/ML IJ SOLN
30.0000 mg | Freq: Four times a day (QID) | INTRAMUSCULAR | Status: AC
  Administered 2017-09-22 – 2017-09-23 (×4): 30 mg via INTRAVENOUS
  Filled 2017-09-22 (×4): qty 2

## 2017-09-22 MED ORDER — ACETAMINOPHEN 500 MG PO TABS
1000.0000 mg | ORAL_TABLET | Freq: Four times a day (QID) | ORAL | Status: AC
  Administered 2017-09-22 – 2017-09-23 (×4): 1000 mg via ORAL
  Filled 2017-09-22 (×4): qty 2

## 2017-09-22 MED ORDER — SODIUM CHLORIDE 0.9 % IV SOLN
INTRAVENOUS | Status: DC
  Administered 2017-09-22: 1000 mL via INTRAVENOUS

## 2017-09-22 MED ORDER — OXYCODONE HCL 5 MG PO TABS
ORAL_TABLET | ORAL | Status: AC
  Filled 2017-09-22: qty 2

## 2017-09-22 MED ORDER — ACETAMINOPHEN 325 MG PO TABS: 650.0000 mg | ORAL_TABLET | Freq: Four times a day (QID) | ORAL | Status: DC | PRN

## 2017-09-22 MED ORDER — DEXAMETHASONE SODIUM PHOSPHATE 10 MG/ML IJ SOLN
10.0000 mg | Freq: Once | INTRAMUSCULAR | Status: AC
  Administered 2017-09-23: 10 mg via INTRAVENOUS
  Filled 2017-09-22: qty 1

## 2017-09-22 MED ORDER — PHENOL 1.4 % MT LIQD: 1.0000 | OROMUCOSAL | Status: DC | PRN

## 2017-09-22 MED ORDER — DIPHENHYDRAMINE HCL 12.5 MG/5ML PO ELIX: 25.0000 mg | ORAL_SOLUTION | ORAL | Status: DC | PRN

## 2017-09-22 MED ORDER — HYDROCHLOROTHIAZIDE 25 MG PO TABS
25.0000 mg | ORAL_TABLET | Freq: Every day | ORAL | Status: DC
  Administered 2017-09-23 – 2017-09-24 (×2): 25 mg via ORAL
  Filled 2017-09-22 (×2): qty 1

## 2017-09-22 MED ORDER — SUGAMMADEX SODIUM 500 MG/5ML IV SOLN
INTRAVENOUS | Status: AC
  Filled 2017-09-22: qty 5

## 2017-09-22 MED ORDER — PROPOFOL 10 MG/ML IV BOLUS
INTRAVENOUS | Status: DC | PRN
  Administered 2017-09-22: 180 mg via INTRAVENOUS
  Administered 2017-09-22: 20 mg via INTRAVENOUS

## 2017-09-22 MED ORDER — LACTATED RINGERS IV SOLN
INTRAVENOUS | Status: DC
  Administered 2017-09-22 (×2): via INTRAVENOUS

## 2017-09-22 MED ORDER — OXYCODONE HCL 5 MG PO TABS: 5.0000 mg | ORAL_TABLET | ORAL | 0 refills | Status: DC | PRN

## 2017-09-22 MED ORDER — LIDOCAINE 2% (20 MG/ML) 5 ML SYRINGE
INTRAMUSCULAR | Status: AC
  Filled 2017-09-22: qty 5

## 2017-09-22 MED ORDER — FENTANYL CITRATE (PF) 100 MCG/2ML IJ SOLN
25.0000 ug | INTRAMUSCULAR | Status: DC | PRN
  Administered 2017-09-22 (×3): 50 ug via INTRAVENOUS

## 2017-09-22 MED ORDER — BUPROPION HCL 100 MG PO TABS
100.0000 mg | ORAL_TABLET | Freq: Two times a day (BID) | ORAL | Status: DC
  Administered 2017-09-22 – 2017-09-24 (×4): 100 mg via ORAL
  Filled 2017-09-22 (×4): qty 1

## 2017-09-22 MED ORDER — SULFAMETHOXAZOLE-TRIMETHOPRIM 800-160 MG PO TABS: 1.0000 | ORAL_TABLET | Freq: Two times a day (BID) | ORAL | 0 refills | Status: DC

## 2017-09-22 MED ORDER — METHOCARBAMOL 1000 MG/10ML IJ SOLN: 500.0000 mg | Freq: Four times a day (QID) | INTRAMUSCULAR | Status: DC | PRN

## 2017-09-22 MED ORDER — OXYCODONE HCL 5 MG PO TABS
5.0000 mg | ORAL_TABLET | ORAL | Status: DC | PRN
  Administered 2017-09-22 (×2): 10 mg via ORAL
  Administered 2017-09-23: 15 mg via ORAL
  Administered 2017-09-23: 10 mg via ORAL
  Administered 2017-09-23 – 2017-09-24 (×2): 15 mg via ORAL
  Filled 2017-09-22 (×2): qty 2
  Filled 2017-09-22 (×3): qty 3

## 2017-09-22 MED ORDER — METHOCARBAMOL 500 MG PO TABS
ORAL_TABLET | ORAL | Status: AC
  Filled 2017-09-22: qty 1

## 2017-09-22 MED ORDER — LOSARTAN POTASSIUM 50 MG PO TABS
50.0000 mg | ORAL_TABLET | Freq: Every day | ORAL | Status: DC
  Administered 2017-09-23 – 2017-09-24 (×2): 50 mg via ORAL
  Filled 2017-09-22 (×2): qty 1

## 2017-09-22 MED ORDER — METOCLOPRAMIDE HCL 5 MG/ML IJ SOLN: 5.0000 mg | Freq: Three times a day (TID) | INTRAMUSCULAR | Status: DC | PRN

## 2017-09-22 MED ORDER — MIDAZOLAM HCL 2 MG/2ML IJ SOLN
INTRAMUSCULAR | Status: AC
  Filled 2017-09-22: qty 2

## 2017-09-22 MED ORDER — ASPIRIN EC 325 MG PO TBEC: 325.0000 mg | DELAYED_RELEASE_TABLET | Freq: Two times a day (BID) | ORAL | 0 refills | Status: DC

## 2017-09-22 MED ORDER — LIDOCAINE 2% (20 MG/ML) 5 ML SYRINGE
INTRAMUSCULAR | Status: DC | PRN
  Administered 2017-09-22: 100 mg via INTRAVENOUS

## 2017-09-22 MED ORDER — OXYCODONE HCL ER 15 MG PO T12A
15.0000 mg | EXTENDED_RELEASE_TABLET | Freq: Two times a day (BID) | ORAL | Status: DC
  Administered 2017-09-22 – 2017-09-24 (×4): 15 mg via ORAL
  Filled 2017-09-22 (×4): qty 1

## 2017-09-22 MED ORDER — PROMETHAZINE HCL 25 MG PO TABS: 25.0000 mg | ORAL_TABLET | Freq: Four times a day (QID) | ORAL | 1 refills | Status: DC | PRN

## 2017-09-22 MED ORDER — SUGAMMADEX SODIUM 200 MG/2ML IV SOLN
INTRAVENOUS | Status: DC | PRN
  Administered 2017-09-22: 250 mg via INTRAVENOUS

## 2017-09-22 MED ORDER — SUGAMMADEX SODIUM 200 MG/2ML IV SOLN
INTRAVENOUS | Status: AC
  Filled 2017-09-22: qty 2

## 2017-09-22 MED ORDER — ALUM & MAG HYDROXIDE-SIMETH 200-200-20 MG/5ML PO SUSP
30.0000 mL | ORAL | Status: DC | PRN
  Administered 2017-09-24: 30 mL via ORAL
  Filled 2017-09-22: qty 30

## 2017-09-22 MED ORDER — ONDANSETRON HCL 4 MG/2ML IJ SOLN
INTRAMUSCULAR | Status: DC | PRN
  Administered 2017-09-22: 4 mg via INTRAVENOUS

## 2017-09-22 SURGICAL SUPPLY — 51 items
BAG DECANTER FOR FLEXI CONT (MISCELLANEOUS) ×2 IMPLANT
CAPT HIP TOTAL 2 ×2 IMPLANT
CELLS DAT CNTRL 66122 CELL SVR (MISCELLANEOUS) ×1 IMPLANT
COVER SURGICAL LIGHT HANDLE (MISCELLANEOUS) ×2 IMPLANT
DERMABOND ADVANCED (GAUZE/BANDAGES/DRESSINGS) ×1
DERMABOND ADVANCED .7 DNX12 (GAUZE/BANDAGES/DRESSINGS) ×1 IMPLANT
DRAPE C-ARM 42X72 X-RAY (DRAPES) ×2 IMPLANT
DRAPE STERI IOBAN 125X83 (DRAPES) ×2 IMPLANT
DRAPE U-SHAPE 47X51 STRL (DRAPES) ×4 IMPLANT
DRSG AQUACEL AG ADV 3.5X10 (GAUZE/BANDAGES/DRESSINGS) ×2 IMPLANT
DURAPREP 26ML APPLICATOR (WOUND CARE) ×2 IMPLANT
ELECT BLADE 4.0 EZ CLEAN MEGAD (MISCELLANEOUS) ×2
ELECT REM PT RETURN 9FT ADLT (ELECTROSURGICAL) ×2
ELECTRODE BLDE 4.0 EZ CLN MEGD (MISCELLANEOUS) ×1 IMPLANT
ELECTRODE REM PT RTRN 9FT ADLT (ELECTROSURGICAL) ×1 IMPLANT
GLOVE BIO SURGEON STRL SZ 6.5 (GLOVE) ×2 IMPLANT
GLOVE BIOGEL PI IND STRL 6.5 (GLOVE) ×3 IMPLANT
GLOVE BIOGEL PI IND STRL 7.5 (GLOVE) ×1 IMPLANT
GLOVE BIOGEL PI INDICATOR 6.5 (GLOVE) ×3
GLOVE BIOGEL PI INDICATOR 7.5 (GLOVE) ×1
GLOVE ECLIPSE 6.0 STRL STRAW (GLOVE) ×2 IMPLANT
GLOVE SKINSENSE NS SZ7.5 (GLOVE) ×1
GLOVE SKINSENSE STRL SZ7.5 (GLOVE) ×1 IMPLANT
GLOVE SURG SS PI 6.5 STRL IVOR (GLOVE) ×2 IMPLANT
GLOVE SURG SYN 7.5  E (GLOVE) ×3
GLOVE SURG SYN 7.5 E (GLOVE) ×3 IMPLANT
GOWN SRG XL XLNG 56XLVL 4 (GOWN DISPOSABLE) ×1 IMPLANT
GOWN STRL NON-REIN XL XLG LVL4 (GOWN DISPOSABLE) ×1
GOWN STRL REUS W/ TWL LRG LVL3 (GOWN DISPOSABLE) ×3 IMPLANT
GOWN STRL REUS W/TWL LRG LVL3 (GOWN DISPOSABLE) ×3
HANDPIECE INTERPULSE COAX TIP (DISPOSABLE) ×1
HOOD PEEL AWAY FLYTE STAYCOOL (MISCELLANEOUS) ×4 IMPLANT
IV NS IRRIG 3000ML ARTHROMATIC (IV SOLUTION) ×2 IMPLANT
KIT BASIN OR (CUSTOM PROCEDURE TRAY) ×2 IMPLANT
MARKER SKIN DUAL TIP RULER LAB (MISCELLANEOUS) ×2 IMPLANT
PACK TOTAL JOINT (CUSTOM PROCEDURE TRAY) ×2 IMPLANT
PACK UNIVERSAL I (CUSTOM PROCEDURE TRAY) ×2 IMPLANT
RTRCTR WOUND ALEXIS 18CM MED (MISCELLANEOUS) ×2
SAW OSC TIP CART 19.5X105X1.3 (SAW) ×2 IMPLANT
SET HNDPC FAN SPRY TIP SCT (DISPOSABLE) ×1 IMPLANT
SUT ETHIBOND 2 V 37 (SUTURE) ×2 IMPLANT
SUT ETHILON 2 0 FS 18 (SUTURE) ×6 IMPLANT
SUT VIC AB 0 CT1 27 (SUTURE) ×1
SUT VIC AB 0 CT1 27XBRD ANBCTR (SUTURE) ×1 IMPLANT
SUT VIC AB 1 CT1 27 (SUTURE) ×1
SUT VIC AB 1 CT1 27XBRD ANBCTR (SUTURE) ×1 IMPLANT
SUT VIC AB 2-0 CT1 27 (SUTURE) ×2
SUT VIC AB 2-0 CT1 36 (SUTURE) ×2 IMPLANT
SUT VIC AB 2-0 CT1 TAPERPNT 27 (SUTURE) ×2 IMPLANT
TOWEL OR 17X26 10 PK STRL BLUE (TOWEL DISPOSABLE) ×2 IMPLANT
YANKAUER SUCT BULB TIP NO VENT (SUCTIONS) ×2 IMPLANT

## 2017-09-22 NOTE — Transfer of Care (Signed)
Immediate Anesthesia Transfer of Care Note  Patient: Sarah Randall  Procedure(s) Performed: LEFT TOTAL HIP ARTHROPLASTY ANTERIOR APPROACH (Left Hip)  Patient Location: PACU  Anesthesia Type:General  Level of Consciousness: awake, alert  and patient cooperative  Airway & Oxygen Therapy: Patient Spontanous Breathing and Patient connected to nasal cannula oxygen  Post-op Assessment: Report given to RN, Post -op Vital signs reviewed and stable and Patient moving all extremities  Post vital signs: Reviewed and stable  Last Vitals:  Vitals:   09/22/17 0946  BP: 113/72  Pulse: 68  Resp: 19  Temp: 36.8 C  SpO2: 100%    Last Pain:  Vitals:   09/22/17 0958  TempSrc:   PainSc: 6          Complications: No apparent anesthesia complications

## 2017-09-22 NOTE — Evaluation (Signed)
Physical Therapy Evaluation Patient Details Name: Sarah Randall MRN: [id number] DOB: 04-18-1966 Today's Date: 09/22/2017   History of Present Illness  Pt is a 51 y/o female s/p elective L THA, direct anterior approach. PMH includes DM, HTN, OSA, and arthritis.   Clinical Impression  Pt is s/p surgery above with deficits below. PTA, pt was independent with functional mobility with use of cane. Upon eval, pt limited by post op pain and weakness, decreased sensation, and nausea. Mobility limited to stand pivot to [facility] and then to chair secondary to nausea. RN in room and administered meds during session. Pt reports her husband will be able to assist as needed upon d/c and has all necessary DME. Follow up recommendations per MD arrangements. Will continue to follow acutely to maximize functional mobility independence and safety.     Follow Up Recommendations DC plan and follow up therapy as arranged by surgeon;Supervision for mobility/OOB    Equipment Recommendations  None recommended by PT    Recommendations for Other Services       Precautions / Restrictions Precautions Precautions: None Precaution Comments: Verbally reviewed supine ther ex as pt was very limited by nausea this session.  Restrictions Weight Bearing Restrictions: Yes LLE Weight Bearing: Weight bearing as tolerated      Mobility  Bed Mobility Overal bed mobility: Needs Assistance Bed Mobility: Supine to Sit     Supine to sit: Min assist     General bed mobility comments: Min A for LLE assist. Use of bed rails and elevated HOB   Transfers Overall transfer level: Needs assistance Equipment used: Rolling walker (2 wheeled) Transfers: Sit to/from [facility] Sit to Stand: Min assist Stand pivot transfers: Min assist       General transfer comment: Min A for lift assist and steadying throughout transfer. Required assist with LLE movement during stand pivot to [facility] and then to chair. Verbal cues  for sequencing with RW. Pt experiencing nausea during transfer, so further mobility deferred. RN gave nausea meds during transfer.   Ambulation/Gait                Stairs            Wheelchair Mobility    Modified Rankin (Stroke Patients Only)       Balance Overall balance assessment: Needs assistance Sitting-balance support: No upper extremity supported;Feet supported Sitting balance-Leahy Scale: Good     Standing balance support: Bilateral upper extremity supported;During functional activity Standing balance-Leahy Scale: Poor Standing balance comment: Reliant on UE support                              Pertinent Vitals/Pain Pain Assessment: 0-10 Pain Score: 10-Worst pain ever Pain Location: L hip  Pain Descriptors / Indicators: Aching;Operative site guarding Pain Intervention(s): Limited activity within patient's tolerance;Monitored during session;Repositioned    Home Living Family/patient expects to be discharged to:: Private residence Living Arrangements: Spouse/significant other Available Help at Discharge: Family;Available 24 hours/day Type of Home: House Home Access: Stairs to enter Entrance Stairs-Rails: None Entrance Stairs-Number of Steps: 1 (threshold ) Home Layout: One level Home Equipment: Walker - 2 wheels;Bedside commode;Cane - single point      Prior Function Level of Independence: Independent with assistive device(s)         Comments: Was using cane for ambulation      Hand Dominance   Dominant Hand: Left    Extremity/Trunk Assessment   Upper  Extremity Assessment Upper Extremity Assessment: Overall WFL for tasks assessed    Lower Extremity Assessment Lower Extremity Assessment: LLE deficits/detail LLE Deficits / Details: Numbness reported at hip and in feet. Deficits consistent with post op pain and weakness.     Cervical / Trunk Assessment Cervical / Trunk Assessment: Normal  Communication   Communication:  No difficulties  Cognition Arousal/Alertness: Awake/alert Behavior During Therapy: WFL for tasks assessed/performed Overall Cognitive Status: Within Functional Limits for tasks assessed                                        General Comments General comments (skin integrity, edema, etc.): Pt's husband present during session     Exercises     Assessment/Plan    PT Assessment Patient needs continued PT services  PT Problem List Decreased strength;Decreased range of motion;Decreased activity tolerance;Decreased balance;Impaired sensation;Pain;Decreased mobility;Decreased knowledge of use of DME;Decreased knowledge of precautions       PT Treatment Interventions DME instruction;Gait training;Stair training;Therapeutic exercise;Therapeutic activities;Functional mobility training;Balance training;Neuromuscular re-education;Patient/family education    PT Goals (Current goals can be found in the Care Plan section)  Acute Rehab PT Goals Patient Stated Goal: to feel better  PT Goal Formulation: With patient Time For Goal Achievement: 09/29/17 Potential to Achieve Goals: Good    Frequency 7X/week   Barriers to discharge        Co-evaluation               AM-PAC PT "6 Clicks" Daily Activity  Outcome Measure Difficulty turning over in bed (including adjusting bedclothes, sheets and blankets)?: A Little Difficulty moving from lying on back to sitting on the side of the bed? : Unable Difficulty sitting down on and standing up from a chair with arms (e.g., wheelchair, bedside commode, etc,.)?: Unable Help needed moving to and from a bed to chair (including a wheelchair)?: A Little Help needed walking in hospital room?: A Little Help needed climbing 3-5 steps with a railing? : A Lot 6 Click Score: 13    End of Session Equipment Utilized During Treatment: Gait belt Activity Tolerance: Treatment limited secondary to medical complications (Comment);Patient limited  by pain (nausea ) Patient left: in chair;with call bell/phone within reach;with family/visitor present Nurse Communication: Mobility status PT Visit Diagnosis: Other abnormalities of gait and mobility (R26.89);Pain Pain - Right/Left: Left Pain - part of body: Hip    Time: 0263-7858 PT Time Calculation (min) (ACUTE ONLY): 32 min   Charges:   PT Evaluation $PT Eval Low Complexity: 1 Low PT Treatments $Therapeutic Activity: 8-22 mins   PT G Codes:        Leighton Ruff, PT, DPT  Acute Rehabilitation Services  Pager: [phone]   Sarah Randall 09/22/2017, 6:40 PM

## 2017-09-22 NOTE — Op Note (Addendum)
LEFT TOTAL HIP ARTHROPLASTY ANTERIOR APPROACH  Procedure Note NYARI OLSSON   [id number]  Pre-op Diagnosis: left hip degenerative joint disease     Post-op Diagnosis: same   Operative Procedures  1. Total hip replacement; Left hip; uncemented [id number]   Personnel  Surgeon(s): Leandrew Koyanagi, MD   Anesthesia: general  Prosthesis: Depuy Acetabulum: Pinnacle 52 mm Femur: Corail KA 9 Head: 36 mm size: +1.5 Liner: neutral Bearing Type: ceramic on poly  Total Hip Arthroplasty (Anterior Approach) Op Note:  After informed consent was obtained and the operative extremity marked in the holding area, the patient was brought back to the operating room and placed supine on the HANA table. Next, the operative extremity was prepped and draped in normal sterile fashion. Surgical timeout occurred verifying patient identification, surgical site, surgical procedure and administration of antibiotics.  A modified anterior Smith-Peterson approach to the hip was performed, using the interval between tensor fascia lata and sartorius.  Dissection was carried bluntly down onto the anterior hip capsule. The lateral femoral circumflex vessels were identified and coagulated. A capsulotomy was performed and the capsular flaps tagged for later repair.  Fluoroscopy was utilized to prepare for the femoral neck cut. The neck osteotomy was performed. The femoral head was removed, the acetabular rim was cleared of soft tissue and attention was turned to reaming the acetabulum.  Sequential reaming was performed under fluoroscopic guidance. We reamed to a size 51 mm, and then impacted the acetabular shell. The liner was then placed after irrigation and attention turned to the femur.  After placing the femoral hook, the leg was taken to externally rotated, extended and adducted position taking care to perform soft tissue releases to allow for adequate mobilization of the femur. Soft tissue was cleared from the shoulder of  the greater trochanter and the hook elevator used to improve exposure of the proximal femur. Sequential broaching performed up to a size 9. Trial neck and head were placed. The leg was brought back up to neutral and the construct reduced. The position and sizing of components, offset and leg lengths were checked using fluoroscopy. Stability of the construct was checked in extension and external rotation without any subluxation or impingement of prosthesis. We dislocated the prosthesis, dropped the leg back into position, removed trial components, and irrigated copiously. The final stem and head was then placed, the leg brought back up, the system reduced and fluoroscopy used to verify positioning.  We irrigated, obtained hemostasis and closed the capsule using #2 ethibond suture.  The fascia was closed with #1 vicryl plus, the deep fat layer was closed with 0 vicryl, the subcutaneous layers closed with 2.0 Vicryl Plus and the skin closed with 2.0 nylon and dermabond.  A sterile dressing was applied. The patient was awakened in the operating room and taken to recovery in stable condition.  All sponge, needle, and instrument counts were correct at the end of the case.   Position: supine  Complications: none.  Time Out: performed   Drains/Packing: none  Estimated blood loss: 100 cc  Returned to Recovery Room: in good condition.   Antibiotics: yes   Mechanical VTE (DVT) Prophylaxis: sequential compression devices, TED thigh-high  Chemical VTE (DVT) Prophylaxis: aspirin   Fluid Replacement: see anesthesia record  Specimens Removed: 1 to pathology   Sponge and Instrument Count Correct? yes   PACU: portable radiograph - low AP   Admission: inpatient status  Plan/RTC: Return in 2 weeks for staple removal. Weight Bearing/Load Lower  Extremity: full  Hip precautions: none Suture Removal: 10-14 days  Betadine to incision twice daily once dressing is removed on POD#7  N. Eduard Roux,  MD [place] 1:49 PM      Implant Name Type Inv. Item Serial No. Manufacturer Lot No. LRB No. Used  PIN SECTOR W/GRIP ACE CUP 52MM - [id number] Hips PIN SECTOR W/GRIP ACE CUP 52MM  DEPUY SYNTHES [id number] Left 1  LINER ACETAB NEUTRAL 36ID 520D - [id number] Liner LINER ACETAB NEUTRAL 36ID 520D  DEPUY SYNTHES [id number] Left 1  STEM CORAIL KA09 - [id number] Stem STEM CORAIL KA09  DEPUY SYNTHES [id number] Left 1  HEAD CERAMIC DELTA 36 PLUS 1.5 - [id number] Hips HEAD CERAMIC DELTA 36 PLUS 1.5   DEPUY SYNTHES [id number] Left 1

## 2017-09-22 NOTE — H&P (Signed)
PREOPERATIVE H&P  Chief Complaint: left hip degenerative joint disease  HPI: Sarah Randall is a 51 y.o. female who presents for surgical treatment of left hip degenerative joint disease.  She denies any changes in medical history.  Past Medical History:  Diagnosis Date  . Arthritis   . Diabetes mellitus without complication ([place])   . GERD (gastroesophageal reflux disease)    occasionally takes OTC  . Hypertension   . Sleep apnea    tested 2014 - unable to tolerate machine   Past Surgical History:  Procedure Laterality Date  . TUBAL LIGATION     Social History   Social History  . Marital status: Single    Spouse name: N/A  . Number of children: 2  . Years of education: 9   Occupational History  . unemployed    Social History Main Topics  . Smoking status: Current Some Day Smoker    Packs/day: 0.25    Years: 30.00    Types: Cigarettes  . Smokeless tobacco: Never Used  . Alcohol use Yes     Comment: occasional  . Drug use: No     Comment: smoke 1 joint cannibis 1 week ago.  Marland Kitchen Sexual activity: Yes    Birth control/ protection: None   Other Topics Concern  . None   Social History Narrative  . None   Family History  Problem Relation Age of Onset  . Hypertension Mother   . Diabetes Mother   . Hypertension Father   . Breast cancer Maternal Aunt   . Lung cancer Maternal Aunt    Allergies  Allergen Reactions  . Lipitor [Atorvastatin] Other (See Comments)    Stabbing pains in legs  . Zoloft [Sertraline Hcl] Other (See Comments)    tremors   Prior to Admission medications   Medication Sig Start Date End Date Taking? Authorizing Provider  buPROPion (WELLBUTRIN) 100 MG tablet Take 1 tablet (100 mg total) by mouth 2 (two) times daily. 08/10/17  Yes Ladell Pier, MD  hydrochlorothiazide (HYDRODIURIL) 25 MG tablet Take 1 tablet (25 mg total) by mouth daily. 06/25/17  Yes Ladell Pier, MD  losartan (COZAAR) 50 MG tablet Take 1 tablet (50 mg total)  by mouth daily. 06/25/17  Yes Ladell Pier, MD  meloxicam (MOBIC) 15 MG tablet Take 1 tablet (15 mg total) by mouth daily. 06/25/17  Yes Ladell Pier, MD  pravastatin (PRAVACHOL) 20 MG tablet 1 tab Q Mon/Wed and Fri Patient taking differently: Take 20 mg by mouth every Monday, Wednesday, and Friday.  08/10/17  Yes Ladell Pier, MD  traMADol (ULTRAM) 50 MG tablet Take 1 tablet (50 mg total) by mouth every 8 (eight) hours as needed. Patient taking differently: Take 50 mg by mouth every 8 (eight) hours as needed for moderate pain.  06/25/17  Yes Ladell Pier, MD     Positive ROS: All other systems have been reviewed and were otherwise negative with the exception of those mentioned in the HPI and as above.  Physical Exam: General: Alert, no acute distress Cardiovascular: No pedal edema Respiratory: No cyanosis, no use of accessory musculature GI: abdomen soft Skin: No lesions in the area of chief complaint Neurologic: Sensation intact distally Psychiatric: Patient is competent for consent with normal mood and affect Lymphatic: no lymphedema  MUSCULOSKELETAL: exam stable  Assessment: left hip degenerative joint disease  Plan: Plan for Procedure(s): LEFT TOTAL HIP ARTHROPLASTY ANTERIOR APPROACH  The risks benefits and alternatives were  discussed with the patient including but not limited to the risks of nonoperative treatment, versus surgical intervention including infection, bleeding, nerve injury,  blood clots, cardiopulmonary complications, morbidity, mortality, among others, and they were willing to proceed.   Eduard Roux, MD   09/22/2017 11:56 AM

## 2017-09-22 NOTE — Discharge Instructions (Signed)

## 2017-09-22 NOTE — Anesthesia Preprocedure Evaluation (Addendum)
Anesthesia Evaluation  Patient identified by MRN, date of birth, ID band Patient awake    Reviewed: Allergy & Precautions, H&P , Patient's Chart, lab work & pertinent test results, reviewed documented beta blocker date and time   Airway Mallampati: IV  TM Distance: >3 FB Neck ROM: full    Dental no notable dental hx. (+) Dental Advisory Given   Pulmonary sleep apnea , Current Smoker,    Pulmonary exam normal breath sounds clear to auscultation       Cardiovascular hypertension,  Rhythm:regular Rate:Normal     Neuro/Psych    GI/Hepatic   Endo/Other  diabetesMorbid obesity  Renal/GU      Musculoskeletal   Abdominal   Peds  Hematology   Anesthesia Other Findings   Reproductive/Obstetrics                            Anesthesia Physical Anesthesia Plan  ASA: III  Anesthesia Plan: General   Post-op Pain Management:    Induction: Intravenous  PONV Risk Score and Plan: 2 and Ondansetron, Dexamethasone and Treatment may vary due to age or medical condition  Airway Management Planned: LMA  Additional Equipment:   Intra-op Plan:   Post-operative Plan:   Informed Consent: I have reviewed the patients History and Physical, chart, labs and discussed the procedure including the risks, benefits and alternatives for the proposed anesthesia with the patient or authorized representative who has indicated his/her understanding and acceptance.   Dental Advisory Given  Plan Discussed with: CRNA and Surgeon  Anesthesia Plan Comments: ( )        Anesthesia Quick Evaluation

## 2017-09-22 NOTE — Anesthesia Procedure Notes (Signed)
Procedure Name: Intubation Date/Time: 09/22/2017 12:11 PM Performed by: Sampson Si E Pre-anesthesia Checklist: Patient identified, Emergency Drugs available, Suction available and Patient being monitored Patient Re-evaluated:Patient Re-evaluated prior to induction Oxygen Delivery Method: Circle System Utilized Preoxygenation: Pre-oxygenation with 100% oxygen Induction Type: IV induction Ventilation: Mask ventilation without difficulty Laryngoscope Size: Mac and 3 Grade View: Grade I Tube type: Oral Tube size: 7.0 mm Number of attempts: 1 Airway Equipment and Method: Stylet and Oral airway Placement Confirmation: ETT inserted through vocal cords under direct vision,  positive ETCO2 and breath sounds checked- equal and bilateral Secured at: 21 cm Tube secured with: Tape Dental Injury: Teeth and Oropharynx as per pre-operative assessment

## 2017-09-23 ENCOUNTER — Encounter (HOSPITAL_COMMUNITY): Payer: Self-pay | Admitting: Orthopaedic Surgery

## 2017-09-23 LAB — BASIC METABOLIC PANEL
Anion gap: 13 (ref 5–15)
BUN: 18 mg/dL (ref 6–20)
CO2: 20 mmol/L — ABNORMAL LOW (ref 22–32)
Calcium: 8.4 mg/dL — ABNORMAL LOW (ref 8.9–10.3)
Chloride: 96 mmol/L — ABNORMAL LOW (ref 101–111)
Creatinine, Ser: 1.09 mg/dL — ABNORMAL HIGH (ref 0.44–1.00)
GFR calc Af Amer: >60 mL/min (ref >60)
GFR calc non Af Amer: 58 mL/min — ABNORMAL LOW (ref >60)
Glucose, Bld: 134 mg/dL — ABNORMAL HIGH (ref 65–99)
Potassium: 4.1 mmol/L (ref 3.5–5.1)
Sodium: 129 mmol/L — ABNORMAL LOW (ref 135–145)

## 2017-09-23 LAB — CBC
HCT: 39.8 % (ref 36.0–46.0)
Hemoglobin: 13.4 g/dL (ref 12.0–15.0)
MCH: 31.3 pg (ref 26.0–34.0)
MCHC: 33.7 g/dL (ref 30.0–36.0)
MCV: 93 fL (ref 78.0–100.0)
Platelets: 300 10*3/uL (ref 150–400)
RBC: 4.28 MIL/uL (ref 3.87–5.11)
RDW: 13.6 % (ref 11.5–15.5)
WBC: 20.8 10*3/uL — ABNORMAL HIGH (ref 4.0–10.5)

## 2017-09-23 NOTE — Plan of Care (Signed)
Problem: Education: Goal: Knowledge of [place] [facility] Education information/materials will improve Outcome: Progressing POC reviewed with pt.  Problem: Pain Managment: Goal: General experience of comfort will improve Outcome: Progressing Pain management discussed with pt.

## 2017-09-23 NOTE — Anesthesia Postprocedure Evaluation (Signed)
Anesthesia Post Note  Patient: Sarah Randall  Procedure(s) Performed: LEFT TOTAL HIP ARTHROPLASTY ANTERIOR APPROACH (Left Hip)     Patient location during evaluation: PACU Anesthesia Type: General Level of consciousness: awake and alert Pain management: pain level controlled Vital Signs Assessment: post-procedure vital signs reviewed and stable Respiratory status: spontaneous breathing, nonlabored ventilation, respiratory function stable and patient connected to nasal cannula oxygen Cardiovascular status: blood pressure returned to baseline and stable Postop Assessment: no apparent nausea or vomiting Anesthetic complications: no    Last Vitals:  Vitals:   09/23/17 0142 09/23/17 0431  BP: 112/83 114/80  Pulse: 76 72  Resp: 16 16  Temp: 36.6 C 36.4 C  SpO2: 98% 97%    Last Pain:  Vitals:   09/23/17 0626  TempSrc:   PainSc: 7                  Sally Reimers EDWARD

## 2017-09-23 NOTE — Progress Notes (Signed)
Physical Therapy Treatment Patient Details Name: Sarah Randall MRN: [id number] DOB: [birth year] Today's Date: 09/23/2017    History of Present Illness Pt is a 51 y/o female s/p elective L THA, direct anterior approach. PMH includes DM, HTN, OSA, and arthritis.     PT Comments    Pt performed gait and able to increase functional mobility during session this am.  PTA reviewed supine and seated exercises from HEP. Plan this pm for continued gait and progression to standing exercises.     Follow Up Recommendations  DC plan and follow up therapy as arranged by surgeon;Supervision for mobility/OOB     Equipment Recommendations  None recommended by PT    Recommendations for Other Services       Precautions / Restrictions Precautions Precautions: None Precaution Comments: Verbally reviewed supine ther ex as pt was very limited by nausea this session.  Restrictions Weight Bearing Restrictions: Yes LLE Weight Bearing: Weight bearing as tolerated    Mobility  Bed Mobility Overal bed mobility: Needs Assistance Bed Mobility: Supine to Sit     Supine to sit: Min assist     General bed mobility comments: Min A for LLE assist. Use of bed rails and elevated HOB   Transfers Overall transfer level: Needs assistance Equipment used: Rolling walker (2 wheeled) Transfers: Sit to/from [facility] Sit to Stand: Min assist         General transfer comment: Min asssist with cues for hand placement to and from seated surface.  Ambulation/Gait Ambulation/Gait assistance: Min guard Ambulation Distance (Feet): 200 Feet Assistive device: Rolling walker (2 wheeled) Gait Pattern/deviations: Step-to pattern;Decreased stride length;Shuffle;Trunk flexed;Antalgic     General Gait Details: Cues for upper trunk control and sequencing.  Cues for safety with RW able to progress gait with no signs of nausea.    Stairs            Wheelchair Mobility    Modified Rankin  (Stroke Patients Only)       Balance Overall balance assessment: Needs assistance Sitting-balance support: No upper extremity supported;Feet supported Sitting balance-Leahy Scale: Good     Standing balance support: Bilateral upper extremity supported;During functional activity Standing balance-Leahy Scale: Poor Standing balance comment: Reliant on UE support                             Cognition Arousal/Alertness: Awake/alert Behavior During Therapy: WFL for tasks assessed/performed Overall Cognitive Status: Within Functional Limits for tasks assessed                                        Exercises Total Joint Exercises Ankle Circles/Pumps: AROM;Both;10 reps;Supine Quad Sets: AROM;Left;10 reps;Supine Towel Squeeze: AROM;Both;10 reps;Supine Heel Slides: Left;10 reps;Supine;AAROM Hip ABduction/ADduction: AAROM;Left;10 reps;Supine Long Arc Quad: AROM;Left;10 reps;Seated    General Comments        Pertinent Vitals/Pain Pain Assessment: 0-10 Pain Score: 6  Pain Location: L hip  Pain Descriptors / Indicators: Aching;Operative site guarding Pain Intervention(s): Monitored during session;Repositioned;Ice applied    Home Living                      Prior Function            PT Goals (current goals can now be found in the care plan section) Acute Rehab PT Goals Patient Stated Goal: to feel better  Potential to Achieve Goals: Good Progress towards PT goals: Progressing toward goals    Frequency    7X/week      PT Plan Current plan remains appropriate    Co-evaluation              AM-PAC PT "6 Clicks" Daily Activity  Outcome Measure  Difficulty turning over in bed (including adjusting bedclothes, sheets and blankets)?: A Little Difficulty moving from lying on back to sitting on the side of the bed? : Unable Difficulty sitting down on and standing up from a chair with arms (e.g., wheelchair, bedside commode, etc,.)?:  Unable Help needed moving to and from a bed to chair (including a wheelchair)?: A Little Help needed walking in hospital room?: A Little Help needed climbing 3-5 steps with a railing? : A Lot 6 Click Score: 13    End of Session Equipment Utilized During Treatment: Gait belt Activity Tolerance: Treatment limited secondary to medical complications (Comment);Patient limited by pain (nausea) Patient left: in chair;with call bell/phone within reach;with family/visitor present Nurse Communication: Mobility status PT Visit Diagnosis: Other abnormalities of gait and mobility (R26.89);Pain Pain - Right/Left: Left Pain - part of body: Hip     Time: 5027-7412 PT Time Calculation (min) (ACUTE ONLY): 24 min  Charges:  $Gait Training: 8-22 mins $Therapeutic Exercise: 8-22 mins                    G Codes:       Governor Rooks, PTA pager [phone]    Cristela Blue 09/23/2017, 3:55 PM

## 2017-09-23 NOTE — Care Management Note (Signed)
Case Management Note  Patient Details  Name: Sarah Randall MRN: [id number] Date of Birth: [birth year]  Subjective/Objective:   51 yr old female s/p left total hip arthroplasty.                 Action/Plan: Case manager spoke with patient concerning [place] and DME needs. Referral was called to [place], [facility]. Patient will have assistance at discharge.    Expected Discharge Date:    09/24/17              Expected Discharge Plan:  [place]  In-House Referral:  NA  Discharge planning Services  CM Consult  Post Acute Care Choice:  Durable Medical Equipment, Home Health Choice offered to:  Patient  DME Arranged:  3-N-1 DME Agency:  [place]:  PT [facility] Agency:  [place]  Status of Service:  Completed, signed off  If discussed at [place] of Stay Meetings, dates discussed:    Additional Comments:  Ninfa Meeker, RN 09/23/2017, 3:41 PM

## 2017-09-23 NOTE — Progress Notes (Signed)
   Subjective:  Patient reports pain as mild.    Objective:   VITALS:   Vitals:   09/22/17 1600 09/22/17 2030 09/23/17 0142 09/23/17 0431  BP:  109/80 112/83 114/80  Pulse: 93 80 76 72  Resp: 17 16 16 16   Temp:  (!) 97.5 F (36.4 C) 97.8 F (36.6 C) 97.6 F (36.4 C)  TempSrc:  Oral Oral Oral  SpO2: 98% 99% 98% 97%  Weight:        Neurologically intact Neurovascular intact Sensation intact distally Intact pulses distally Dorsiflexion/Plantar flexion intact Incision: dressing C/D/I and no drainage No cellulitis present Compartment soft   Lab Results  Component Value Date   WBC 20.8 (H) 09/23/2017   HGB 13.4 09/23/2017   HCT 39.8 09/23/2017   MCV 93.0 09/23/2017   PLT 300 09/23/2017     Assessment/Plan:  1 Day Post-Op   - Expected postop acute blood loss anemia - will monitor for symptoms - Up with PT/OT - DVT ppx - SCDs, ambulation, aspirin - WBAT operative extremity - Pain control - Discharge planning - likely home with HHPT  Eduard Roux 09/23/2017, 7:35 AM [phone]

## 2017-09-23 NOTE — Progress Notes (Signed)
Physical Therapy Treatment Patient Details Name: Sarah Randall MRN: [id number] DOB: [birth year] Today's Date: 09/23/2017    History of Present Illness Pt is a 51 y/o female s/p elective L THA, direct anterior approach. PMH includes DM, HTN, OSA, and arthritis.     PT Comments    Pt performed increased activity.  Reviewed strategies for bed mobility, progressed to step through sequencing, and standing exercises.  Pt resting in supine post session with cold pack applied.  Plan next session for stair training.     Follow Up Recommendations  DC plan and follow up therapy as arranged by surgeon;Supervision for mobility/OOB     Equipment Recommendations  None recommended by PT    Recommendations for Other Services       Precautions / Restrictions Precautions Precautions: None Precaution Comments: Verbally reviewed supine ther ex as pt was very limited by nausea this session.  Restrictions Weight Bearing Restrictions: Yes LLE Weight Bearing: Weight bearing as tolerated    Mobility  Bed Mobility Overal bed mobility: Needs Assistance Bed Mobility: Supine to Sit;Sit to Supine     Supine to sit: Min assist Sit to supine: Supervision   General bed mobility comments: Min A for LLE assist. Use of bed rails and elevated HOB.  To return to bed, bed placed flat and instructed patient to use sheet under her left foot to assist into supine.    Transfers Overall transfer level: Needs assistance Equipment used: Rolling walker (2 wheeled) Transfers: Sit to/from [facility] Sit to Stand: Min guard         General transfer comment: Remains to require cues for hand placement to avoid pulling on RW into standing.    Ambulation/Gait Ambulation/Gait assistance: Min guard Ambulation Distance (Feet): 200 Feet Assistive device: Rolling walker (2 wheeled) Gait Pattern/deviations: Decreased stride length;Shuffle;Trunk flexed;Antalgic;Step-through pattern   Gait velocity  interpretation: Below normal speed for age/gender General Gait Details: Cues for upper trunk control and sequencing.  Pt able to follow cues for gait symmetry and progression to step through pattern.     Stairs            Wheelchair Mobility    Modified Rankin (Stroke Patients Only)       Balance Overall balance assessment: Needs assistance Sitting-balance support: No upper extremity supported;Feet supported Sitting balance-Leahy Scale: Good     Standing balance support: Bilateral upper extremity supported;During functional activity Standing balance-Leahy Scale: Poor Standing balance comment: Reliant on UE support                             Cognition Arousal/Alertness: Awake/alert Behavior During Therapy: WFL for tasks assessed/performed Overall Cognitive Status: Within Functional Limits for tasks assessed                                        Exercises Total Joint Exercises  Hip ABduction/ADduction: AAROM;Left;10 reps;Standing Knee Flexion: AROM;Left;10 reps;Standing Marching in Standing: AROM;Left;10 reps;Standing Standing Hip Extension: AROM;Left;10 reps;Standing    General Comments        Pertinent Vitals/Pain Pain Assessment: 0-10 Pain Score: 4  Pain Location: L hip  Pain Descriptors / Indicators: Aching;Operative site guarding Pain Intervention(s): Monitored during session;Repositioned;Ice applied    Home Living                      Prior  Function            PT Goals (current goals can now be found in the care plan section) Acute Rehab PT Goals Patient Stated Goal: to feel better  Potential to Achieve Goals: Good Progress towards PT goals: Progressing toward goals    Frequency    7X/week      PT Plan Current plan remains appropriate    Co-evaluation              AM-PAC PT "6 Clicks" Daily Activity  Outcome Measure  Difficulty turning over in bed (including adjusting bedclothes, sheets  and blankets)?: A Little Difficulty moving from lying on back to sitting on the side of the bed? : Unable Difficulty sitting down on and standing up from a chair with arms (e.g., wheelchair, bedside commode, etc,.)?: A Little Help needed moving to and from a bed to chair (including a wheelchair)?: A Little Help needed walking in hospital room?: A Little Help needed climbing 3-5 steps with a railing? : A Little 6 Click Score: 16    End of Session Equipment Utilized During Treatment: Gait belt Activity Tolerance: Patient tolerated treatment well Patient left: with call bell/phone within reach;in bed Nurse Communication: Mobility status;Patient requests pain meds PT Visit Diagnosis: Other abnormalities of gait and mobility (R26.89);Pain Pain - Right/Left: Left Pain - part of body: Hip     Time: 1527-1550 PT Time Calculation (min) (ACUTE ONLY): 23 min  Charges:  $Gait Training: 8-22 mins $Therapeutic Exercise: 8-22 mins                    G Codes:       Governor Rooks, PTA pager [phone]    Cristela Blue 09/23/2017, 4:02 PM

## 2017-09-24 ENCOUNTER — Telehealth (INDEPENDENT_AMBULATORY_CARE_PROVIDER_SITE_OTHER): Payer: Self-pay | Admitting: Orthopaedic Surgery

## 2017-09-24 NOTE — Progress Notes (Signed)
   Subjective:  Patient reports pain as mild.    Objective:   VITALS:   Vitals:   09/23/17 0952 09/23/17 1351 09/23/17 2025 09/24/17 0511  BP: 126/74 136/89 124/64 125/83  Pulse: 66 70 71 73  Resp: 20 18 18 17   Temp: (!) 97.4 F (36.3 C) 98.7 F (37.1 C) 98.2 F (36.8 C) 98.1 F (36.7 C)  TempSrc: Oral Oral Oral Oral  SpO2: 100% 98% 100% 100%  Weight:        Neurologically intact Neurovascular intact Sensation intact distally Intact pulses distally Dorsiflexion/Plantar flexion intact Incision: dressing C/D/I and no drainage No cellulitis present Compartment soft   Lab Results  Component Value Date   WBC 20.8 (H) 09/23/2017   HGB 13.4 09/23/2017   HCT 39.8 09/23/2017   MCV 93.0 09/23/2017   PLT 300 09/23/2017     Assessment/Plan:  2 Days Post-Op   - stable from ortho stand point for d/c after stair training with PT today  Eduard Roux 09/24/2017, 7:20 AM [phone]

## 2017-09-24 NOTE — Discharge Summary (Signed)
Physician Discharge Summary      Patient ID: Sarah Randall MRN: [id number] DOB/AGE: Apr 17, 1966 51 y.o.  Admit date: 09/22/2017 Discharge date: 09/24/2017  Admission Diagnoses:  <principal problem not specified>  Discharge Diagnoses:  Active Problems:   History of hip replacement   Past Medical History:  Diagnosis Date  . Arthritis   . Diabetes mellitus without complication ([place])   . GERD (gastroesophageal reflux disease)    occasionally takes OTC  . Hypertension   . Sleep apnea    tested 2014 - unable to tolerate machine    Surgeries: Procedure(s): LEFT TOTAL HIP ARTHROPLASTY ANTERIOR APPROACH on 09/22/2017   Consultants (if any):   Discharged Condition: Improved  Hospital Course: Sarah Randall is an 51 y.o. female who was admitted 09/22/2017 with a diagnosis of <principal problem not specified> and went to the operating room on 09/22/2017 and underwent the above named procedures.    She was given perioperative antibiotics:  Anti-infectives    Start     Dose/Rate Route Frequency Ordered Stop   09/23/17 1000  sulfamethoxazole-trimethoprim (BACTRIM DS,SEPTRA DS) 800-160 MG per tablet 1 tablet     1 tablet Oral Every 12 hours 09/22/17 1620     09/22/17 2000  ceFAZolin (ANCEF) 3 g in dextrose 5 % 50 mL IVPB     3 g 130 mL/hr over 30 Minutes Intravenous Every 8 hours 09/22/17 1620 09/23/17 1516   09/22/17 0900  ceFAZolin (ANCEF) 3 g in dextrose 5 % 50 mL IVPB     3 g 130 mL/hr over 30 Minutes Intravenous To Short Stay 09/21/17 1012 09/22/17 1212   09/22/17 0000  sulfamethoxazole-trimethoprim (BACTRIM DS,SEPTRA DS) 800-160 MG tablet     1 tablet Oral 2 times daily 09/22/17 1354      .  She was given sequential compression devices, early ambulation, and aspirin for DVT prophylaxis.  She benefited maximally from the hospital stay and there were no complications.    Recent vital signs:  Vitals:   09/23/17 2025 09/24/17 0511  BP: 124/64 125/83  Pulse: 71 73    Resp: 18 17  Temp: 98.2 F (36.8 C) 98.1 F (36.7 C)  SpO2: 100% 100%    Recent laboratory studies:  Lab Results  Component Value Date   HGB 13.4 09/23/2017   HGB 15.4 (H) 09/13/2017   HGB 17.0 (H) 06/25/2017   Lab Results  Component Value Date   WBC 20.8 (H) 09/23/2017   PLT 300 09/23/2017   Lab Results  Component Value Date   INR 0.97 09/13/2017   Lab Results  Component Value Date   NA 129 (L) 09/23/2017   K 4.1 09/23/2017   CL 96 (L) 09/23/2017   CO2 20 (L) 09/23/2017   BUN 18 09/23/2017   CREATININE 1.09 (H) 09/23/2017   GLUCOSE 134 (H) 09/23/2017    Discharge Medications:   Allergies as of 09/24/2017      Reactions   Lipitor [atorvastatin] Other (See Comments)   Stabbing pains in legs   Zoloft [sertraline Hcl] Other (See Comments)   tremors      Medication List    TAKE these medications   aspirin EC 325 MG tablet Take 1 tablet (325 mg total) by mouth 2 (two) times daily.   buPROPion 100 MG tablet Commonly known as:  WELLBUTRIN Take 1 tablet (100 mg total) by mouth 2 (two) times daily.   hydrochlorothiazide 25 MG tablet Commonly known as:  HYDRODIURIL Take 1 tablet (25 mg total)  by mouth daily.   losartan 50 MG tablet Commonly known as:  COZAAR Take 1 tablet (50 mg total) by mouth daily.   meloxicam 15 MG tablet Commonly known as:  MOBIC Take 1 tablet (15 mg total) by mouth daily.   methocarbamol 750 MG tablet Commonly known as:  ROBAXIN Take 1 tablet (750 mg total) by mouth 2 (two) times daily as needed for muscle spasms.   ondansetron 4 MG tablet Commonly known as:  ZOFRAN Take 1-2 tablets (4-8 mg total) by mouth every 8 (eight) hours as needed for nausea or vomiting.   oxyCODONE 10 mg 12 hr tablet Commonly known as:  OXYCONTIN Take 1 tablet (10 mg total) by mouth every 12 (twelve) hours.   oxyCODONE 5 MG immediate release tablet Commonly known as:  Oxy IR/ROXICODONE Take 1-3 tablets (5-15 mg total) by mouth every 4 (four) hours  as needed.   pravastatin 20 MG tablet Commonly known as:  PRAVACHOL 1 tab Q Mon/Wed and Fri What changed:  how much to take  how to take this  when to take this  additional instructions   promethazine 25 MG tablet Commonly known as:  PHENERGAN Take 1 tablet (25 mg total) by mouth every 6 (six) hours as needed for nausea.   senna-docusate 8.6-50 MG tablet Commonly known as:  SENOKOT S Take 1 tablet by mouth at bedtime as needed.   sulfamethoxazole-trimethoprim 800-160 MG tablet Commonly known as:  BACTRIM DS,SEPTRA DS Take 1 tablet by mouth 2 (two) times daily.   traMADol 50 MG tablet Commonly known as:  ULTRAM Take 1 tablet (50 mg total) by mouth every 8 (eight) hours as needed. What changed:  reasons to take this            Durable Medical Equipment        Start     Ordered   09/23/17 1538  For home use only DME 3 n 1  Once     09/23/17 1538   09/23/17 1537  For home use only DME Walker rolling  Once    Question:  Patient needs a walker to treat with the following condition  Answer:  S/P total hip arthroplasty   09/23/17 1538   09/22/17 1621  DME Walker rolling  Once    Question:  Patient needs a walker to treat with the following condition  Answer:  History of hip replacement   09/22/17 1620   09/22/17 1621  DME 3 n 1  Once     09/22/17 1620   09/22/17 1621  DME Bedside commode  Once    Question:  Patient needs a bedside commode to treat with the following condition  Answer:  History of hip replacement   09/22/17 1620      Diagnostic Studies: Dg Pelvis Portable  Result Date: 09/22/2017 CLINICAL DATA:  Left hip replacement EXAM: PORTABLE PELVIS 1-2 VIEWS COMPARISON:  08/24/2017 FINDINGS: Left hip replacement in satisfactory position alignment. Negative for fracture. IMPRESSION: Satisfactory left hip replacement. Electronically Signed   By: Franchot Gallo M.D.   On: 09/22/2017 15:45   Dg C-arm 1-60 Min  Result Date: 09/22/2017 CLINICAL DATA:  Status  post anterior approach left hip arthroplasty. Reported fluoro time is 31 seconds. EXAM: DG C-ARM 61-120 MIN; OPERATIVE LEFT HIP WITH PELVIS COMPARISON:  Left hip series of October 30, 2015 FINDINGS: The patient has undergone left hip joint prosthesis placement. Radiographic positioning of the prosthetic components is good. The interface with the native bone appears  normal. IMPRESSION: There is no acute postprocedure complication following left hip joint prosthesis placement. Electronically Signed   By: David  Martinique M.D.   On: 09/22/2017 13:55   Dg Hip Operative Unilat With Pelvis Left  Result Date: 09/22/2017 CLINICAL DATA:  Status post anterior approach left hip arthroplasty. Reported fluoro time is 31 seconds. EXAM: DG C-ARM 61-120 MIN; OPERATIVE LEFT HIP WITH PELVIS COMPARISON:  Left hip series of October 30, 2015 FINDINGS: The patient has undergone left hip joint prosthesis placement. Radiographic positioning of the prosthetic components is good. The interface with the native bone appears normal. IMPRESSION: There is no acute postprocedure complication following left hip joint prosthesis placement. Electronically Signed   By: David  Martinique M.D.   On: 09/22/2017 13:55    Disposition: 01-Home or Self Care  Discharge Instructions    Call MD / Call 911    Complete by:  As directed    If you experience chest pain or shortness of breath, CALL 911 and be transported to the hospital emergency room.  If you develope a fever above 101.5 F, pus (white drainage) or increased drainage or redness at the wound, or calf pain, call your surgeon's office.   Constipation Prevention    Complete by:  As directed    Drink plenty of fluids.  Prune juice may be helpful.  You may use a stool softener, such as Colace (over the counter) 100 mg twice a day.  Use MiraLax (over the counter) for constipation as needed.   Driving restrictions    Complete by:  As directed    No driving while taking narcotic pain meds.    Increase activity slowly as tolerated    Complete by:  As directed       Follow-up Information    Leandrew Koyanagi, MD Follow up in 2 week(s).   Specialty:  Orthopedic Surgery Why:  For suture removal, For wound re-check Contact information: [place] [address] [phone]        [facility] Follow up.   Why:  A representative from [place] will contact you to arrange start date and time for your therapy. Contact information: [address] [place] [address] [phone]            Signed: Eduard Roux 09/24/2017, 7:21 AM

## 2017-09-24 NOTE — Progress Notes (Signed)
Patient discharge teaching complete. Meds, diet, activity, follow up appointments and incisional care reviewed and all questions answered. Copy of instructions and prescriptions given to patient. Patient discharged via wheelchair with family.

## 2017-09-24 NOTE — Telephone Encounter (Signed)
Patient called saying that she tried to get her methocarbamol filled at her pharmacy and they are having issues with the manufacture, she was wondering if she could possibly get something else for her muscle spasms. CB #  [id number]

## 2017-09-24 NOTE — Progress Notes (Signed)
Physical Therapy Treatment Patient Details Name: Sarah Randall MRN: [id number] DOB: 22-Apr-1966 Today's Date: 09/24/2017    History of Present Illness Pt is a 51 y/o female s/p elective L THA, direct anterior approach. PMH includes DM, HTN, OSA, and arthritis.     PT Comments    Pt performed increased gait, reviewed stair training and standing exercises in prep for d/c home.  PTA verbally reviewed supine exercises and patient denies questions in regards to technique.  Informed nursing that patient is ready to d/c home from a mobility stand point.     Follow Up Recommendations  DC plan and follow up therapy as arranged by surgeon;Supervision for mobility/OOB     Equipment Recommendations  None recommended by PT    Recommendations for Other Services       Precautions / Restrictions Precautions Precautions: None Precaution Comments: Verbally supine exercises and patient denies any questions in regard to technique.   Restrictions Weight Bearing Restrictions: Yes LLE Weight Bearing: Weight bearing as tolerated    Mobility  Bed Mobility               General bed mobility comments: Pt sitting in recliner on arrival.    Transfers Overall transfer level: Needs assistance Equipment used: Rolling walker (2 wheeled) Transfers: Sit to/from Stand Sit to Stand: Supervision         General transfer comment: Cues for hand placement.  Ambulation/Gait Ambulation/Gait assistance: Supervision Ambulation Distance (Feet): 300 Feet Assistive device: Rolling walker (2 wheeled) Gait Pattern/deviations: Decreased stride length;Shuffle;Trunk flexed;Antalgic;Step-through pattern     General Gait Details: Cues for upper trunk control and sequencing.  Pt able to follow cues for gait symmetry and progression to step through pattern.     Stairs Stairs: Yes   Stair Management: No rails;Forwards;With walker Number of Stairs: 3 General stair comments: Cues for sequencing and RW  placement.  Family present to observe technique.    Wheelchair Mobility    Modified Rankin (Stroke Patients Only)       Balance Overall balance assessment: Needs assistance Sitting-balance support: No upper extremity supported;Feet supported Sitting balance-Leahy Scale: Good       Standing balance-Leahy Scale: Fair                              Cognition Arousal/Alertness: Awake/alert Behavior During Therapy: WFL for tasks assessed/performed Overall Cognitive Status: Within Functional Limits for tasks assessed                                        Exercises Total Joint Exercises Hip ABduction/ADduction: AAROM;Left;10 reps;Standing Knee Flexion: AROM;Left;10 reps;Standing Marching in Standing: AROM;Left;10 reps;Standing Standing Hip Extension: AROM;Left;10 reps;Standing    General Comments        Pertinent Vitals/Pain Pain Assessment: 0-10 Pain Score: 7  Pain Location: L hip  Pain Descriptors / Indicators: Aching;Operative site guarding Pain Intervention(s): Monitored during session;Repositioned    Home Living                      Prior Function            PT Goals (current goals can now be found in the care plan section) Acute Rehab PT Goals Patient Stated Goal: to feel better  Potential to Achieve Goals: Good Progress towards PT goals: Progressing toward goals  Frequency    7X/week      PT Plan Current plan remains appropriate    Co-evaluation              AM-PAC PT "6 Clicks" Daily Activity  Outcome Measure  Difficulty turning over in bed (including adjusting bedclothes, sheets and blankets)?: A Little Difficulty moving from lying on back to sitting on the side of the bed? : A Little Difficulty sitting down on and standing up from a chair with arms (e.g., wheelchair, bedside commode, etc,.)?: A Little Help needed moving to and from a bed to chair (including a wheelchair)?: A Little Help needed  walking in hospital room?: A Little Help needed climbing 3-5 steps with a railing? : A Little 6 Click Score: 18    End of Session Equipment Utilized During Treatment: Gait belt Activity Tolerance: Patient tolerated treatment well Patient left: with call bell/phone within reach;in bed Nurse Communication: Mobility status;Patient requests pain meds PT Visit Diagnosis: Other abnormalities of gait and mobility (R26.89);Pain Pain - Right/Left: Left Pain - part of body: Hip     Time: 4166-0630 PT Time Calculation (min) (ACUTE ONLY): 26 min  Charges:  $Gait Training: 8-22 mins $Therapeutic Exercise: 8-22 mins                    G Codes:       Governor Rooks, PTA pager [phone]    Cristela Blue 09/24/2017, 4:56 PM

## 2017-09-27 ENCOUNTER — Telehealth (INDEPENDENT_AMBULATORY_CARE_PROVIDER_SITE_OTHER): Payer: Self-pay | Admitting: Orthopaedic Surgery

## 2017-09-27 MED ORDER — TIZANIDINE HCL 4 MG PO TABS: ORAL_TABLET | ORAL | 0 refills | Status: DC

## 2017-09-27 NOTE — Telephone Encounter (Signed)
SENT NEW RX INTO PHARM

## 2017-09-27 NOTE — Telephone Encounter (Signed)
Zanaflex 4 mg 3 times daily as needed #60

## 2017-09-27 NOTE — Telephone Encounter (Signed)
Patient called stating that she is needing a pre authorization on her oxycodone. CB # [id number] Wanted to get them filled at the [facility] on [address].

## 2017-09-27 NOTE — Telephone Encounter (Signed)
Is there anything else we can give her?

## 2017-09-27 NOTE — Addendum Note (Signed)
Addended by: Precious Bard on: 09/27/2017 11:03 AM   Modules accepted: Orders

## 2017-09-28 NOTE — Telephone Encounter (Signed)
Patient has no ins therefore she would have to pay out of pocket for RX

## 2017-10-04 ENCOUNTER — Encounter (HOSPITAL_BASED_OUTPATIENT_CLINIC_OR_DEPARTMENT_OTHER): Payer: Self-pay

## 2017-10-07 ENCOUNTER — Telehealth (INDEPENDENT_AMBULATORY_CARE_PROVIDER_SITE_OTHER): Payer: Self-pay | Admitting: *Deleted

## 2017-10-07 NOTE — Telephone Encounter (Signed)
Called patient she states no one has called her. Did you want her to do HHPT or her go out to PT facility? Patient has a scheduled appt on Monday with you.  SURGERY: 09/22/17-(L)THA

## 2017-10-07 NOTE — Telephone Encounter (Signed)
Yes she needs [facility]PT with [facility]

## 2017-10-07 NOTE — Telephone Encounter (Signed)
Pt called stating she was suppose to have physical therapy but has not heard from anyone, I looked in Referral and didn't see where a order was placed fro physical therapy. Do you want pt to have physical therapy? Please place in epic. Thanks

## 2017-10-08 NOTE — Telephone Encounter (Signed)
FAXED ORDER AND DEMO SHEET TO [facility]. THEY WILL BE CALLING HER, CALLED PT SHE IS AWARE.

## 2017-10-11 ENCOUNTER — Encounter (INDEPENDENT_AMBULATORY_CARE_PROVIDER_SITE_OTHER): Payer: Self-pay | Admitting: Orthopaedic Surgery

## 2017-10-11 ENCOUNTER — Ambulatory Visit (INDEPENDENT_AMBULATORY_CARE_PROVIDER_SITE_OTHER): Payer: Self-pay | Admitting: Orthopaedic Surgery

## 2017-10-11 DIAGNOSIS — M1612 Unilateral primary osteoarthritis, left hip: Secondary | ICD-10-CM

## 2017-10-11 MED ORDER — HYDROCODONE-ACETAMINOPHEN 5-325 MG PO TABS: 1.0000 | ORAL_TABLET | Freq: Two times a day (BID) | ORAL | 0 refills | Status: DC | PRN

## 2017-10-11 NOTE — Progress Notes (Signed)
Sarah Randall is 19 days status post left total hip replacement.  She just started home health physical therapy 2 days ago.  Overall she is doing well.  Her preoperative pain has resolved.  She denies any signs or symptoms of infection.  Surgical incision has healed without any signs of infection.  There is no drainage.  The sutures were removed today.  I taught the patient help to keep the groin crease dry to prevent infection.  Patient will finish out 2-week course of Bactrim.  Continue with physical therapy for strengthening.  Follow-up in 4 weeks with for recheck and standing AP pelvis and lateral left hip x-ray.  Continue with aspirin for DVT prophylaxis.

## 2017-10-15 ENCOUNTER — Ambulatory Visit: Payer: Self-pay | Admitting: Internal Medicine

## 2017-10-19 ENCOUNTER — Encounter (HOSPITAL_BASED_OUTPATIENT_CLINIC_OR_DEPARTMENT_OTHER): Payer: Self-pay

## 2017-10-20 ENCOUNTER — Other Ambulatory Visit: Payer: Self-pay | Admitting: Internal Medicine

## 2017-10-20 ENCOUNTER — Telehealth (INDEPENDENT_AMBULATORY_CARE_PROVIDER_SITE_OTHER): Payer: Self-pay | Admitting: Orthopaedic Surgery

## 2017-10-20 DIAGNOSIS — M1612 Unilateral primary osteoarthritis, left hip: Secondary | ICD-10-CM

## 2017-10-20 DIAGNOSIS — M1711 Unilateral primary osteoarthritis, right knee: Secondary | ICD-10-CM

## 2017-10-20 MED FILL — PRAVASTATIN NA 20 MG TAB: 20 | 30 days supply | Qty: 12 | Fill #2

## 2017-10-20 MED FILL — LOSARTAN POTASSIUM 50 MG TA: 50 | 30 days supply | Qty: 30 | Fill #3

## 2017-10-20 MED FILL — HYDROCHLOROTHIAZIDE 25 MG T: 25 | 30 days supply | Qty: 30 | Fill #2

## 2017-10-20 NOTE — Telephone Encounter (Signed)
Fallas,Alexiah M 1966/08/16   Med refill  Hydrocodone [place]  [phone]  Pt is having pain in left groin,promixal part of incision on left hip.Pt has no fever and no redness.[facility] would like to know what is the best solution for the pt. Pt next visit is 11/08/17 if we could move pt up for an earlier appt to get her hip looked at.

## 2017-10-20 NOTE — Telephone Encounter (Signed)
See message below °

## 2017-10-20 NOTE — Telephone Encounter (Signed)
Refill #30.  1-2 tab po daily prn pain.  Sure we can have her come in to have the incision looked at.  Thanks.

## 2017-10-21 MED ORDER — HYDROCODONE-ACETAMINOPHEN 5-325 MG PO TABS: ORAL_TABLET | ORAL | 0 refills | Status: DC

## 2017-10-21 NOTE — Telephone Encounter (Signed)
Rx will be ready for pick up tomorrow AM. States she has appt tomorrow morning and will pick up then.

## 2017-10-22 ENCOUNTER — Ambulatory Visit (INDEPENDENT_AMBULATORY_CARE_PROVIDER_SITE_OTHER): Payer: MEDICAID

## 2017-10-22 ENCOUNTER — Ambulatory Visit (INDEPENDENT_AMBULATORY_CARE_PROVIDER_SITE_OTHER): Payer: MEDICAID | Admitting: Orthopaedic Surgery

## 2017-10-22 DIAGNOSIS — Z96642 Presence of left artificial hip joint: Secondary | ICD-10-CM

## 2017-10-22 MED ORDER — SULFAMETHOXAZOLE-TRIMETHOPRIM 800-160 MG PO TABS
1.0000 | ORAL_TABLET | Freq: Two times a day (BID) | ORAL | 0 refills | Status: DC
Start: 2017-10-22 — End: 2017-12-29

## 2017-10-22 MED ORDER — SULFAMETHOXAZOLE-TRIMETHOPRIM 800-160 MG PO TABS: 1.0000 | ORAL_TABLET | Freq: Two times a day (BID) | ORAL | 0 refills | Status: DC

## 2017-10-22 MED ORDER — MUPIROCIN 2 % EX OINT: 1.0000 "application " | TOPICAL_OINTMENT | Freq: Two times a day (BID) | CUTANEOUS | 0 refills | Status: DC

## 2017-10-22 MED ORDER — HYDROCODONE-ACETAMINOPHEN 7.5-325 MG PO TABS: 1.0000 | ORAL_TABLET | Freq: Two times a day (BID) | ORAL | 0 refills | Status: DC | PRN

## 2017-10-22 MED FILL — SULFAMETHOXAZOLE-TMP DS TAB: 800-160 | 10 days supply | Qty: 20 | Fill #0

## 2017-10-22 MED FILL — MUPIROCIN 2% OINTMENT: 2 | 12 days supply | Qty: 22 | Fill #0

## 2017-10-22 NOTE — Progress Notes (Signed)
Patient is 30 days status post left total hip replacement.  She is doing well.  Her physical therapist mainly wants her to check on the incision.  She denies any constitutional symptoms.  She has a small area of superficial skin breakdown in the proximal edge of the wound in the groin crease.  She has a large pannus.  There is no signs of infection.  X-rays show stable total hip replacement in good alignment.  Recommend Bactroban ointment to the area twice a day.  Just to be safe I like to put her on 10 days of Bactrim.  Outpatient physical therapy referral was made.  Prescription for Norco.  Follow-up as scheduled.  No x-rays needed.

## 2017-11-02 ENCOUNTER — Ambulatory Visit: Payer: Self-pay | Attending: Orthopaedic Surgery | Admitting: Physical Therapy

## 2017-11-02 ENCOUNTER — Encounter: Payer: Self-pay | Admitting: Physical Therapy

## 2017-11-02 DIAGNOSIS — M25652 Stiffness of left hip, not elsewhere classified: Secondary | ICD-10-CM | POA: Insufficient documentation

## 2017-11-02 DIAGNOSIS — M25552 Pain in left hip: Secondary | ICD-10-CM | POA: Insufficient documentation

## 2017-11-02 DIAGNOSIS — R2689 Other abnormalities of gait and mobility: Secondary | ICD-10-CM | POA: Insufficient documentation

## 2017-11-02 DIAGNOSIS — M62838 Other muscle spasm: Secondary | ICD-10-CM | POA: Insufficient documentation

## 2017-11-02 NOTE — Therapy (Addendum)
[place], [place], [address] Phone: [phone]   Fax:  [phone]  Physical Therapy Evaluation  Patient Details  Name: Sarah Randall MRN: [id number] Date of Birth: 22-Aug-1966 Referring Provider: Leandrew Koyanagi, MD   Encounter Date: 11/02/2017  PT End of Session - 11/02/17 1505    Visit Number  1    Number of Visits  13    Date for PT Re-Evaluation  12/14/17    Authorization Type  CAFA    PT Start Time  1414    PT Stop Time  1501    PT Time Calculation (min)  47 min    Activity Tolerance  Patient tolerated treatment well       Past Medical History:  Diagnosis Date  . Arthritis   . Diabetes mellitus without complication ([place])   . GERD (gastroesophageal reflux disease)    occasionally takes OTC  . Hypertension   . Sleep apnea    tested 2014 - unable to tolerate machine    Past Surgical History:  Procedure Laterality Date  . TOTAL HIP ARTHROPLASTY Left 09/22/2017   Procedure: LEFT TOTAL HIP ARTHROPLASTY ANTERIOR APPROACH;  Surgeon: Leandrew Koyanagi, MD;  Location: [facility];  Service: Orthopedics;  Laterality: Left;  . TUBAL LIGATION      There were no vitals filed for this visit.   Subjective Assessment - 11/02/17 1408    Subjective  pt is a 51 y.o F s/p L THA on 09/22/2017 due to gradual worsening of the L hip pain in the groin. Since the surgery she reports things are getting better. pt reports pain is fine during the day, but is worse at night which she sleeps in the recliner with spasm/twitching noted in the L upper thigh.     Limitations  Standing    How long can you sit comfortably?  15-20 min    How long can you stand comfortably?  5 min SPC    How long can you walk comfortably?  5 min with [facility]    Diagnostic tests  X-ray    Patient Stated Goals  improve balance, be mobile,     Currently in Pain?  Yes    Pain Score  3  at worst 9/10 at night    Pain Location  Hip    Pain Orientation   Left    Pain Descriptors / Indicators  Aching;Sharp;Sore    Pain Type  Surgical pain    Pain Onset  More than a month ago    Pain Frequency  Intermittent    Aggravating Factors   laying down in recliner at night, prolonged standing/ walking    Pain Relieving Factors  ice pack, heating pad,          [facility] PT Assessment - 11/02/17 1412      Assessment   Medical Diagnosis  S/p L THA    Referring Provider  Leandrew Koyanagi, MD    Onset Date/Surgical Date  09/22/17    Hand Dominance  Right    Next MD Visit  11/08/2017    Prior Therapy  yes  HHPT      Precautions   Precautions  None      Restrictions   Weight Bearing Restrictions  No      Balance Screen   Has the patient fallen in the past 6 months  No    Has the patient had a decrease in activity level because of  a fear of falling?   No    Is the patient reluctant to leave their home because of a fear of falling?   No      Home Film/video editor residence    Living Arrangements  Spouse/significant other    Available Help at Discharge  Available PRN/intermittently    Type of [place] to enter    Entrance Stairs-Number of Steps  1    Entrance Stairs-Rails  None    Home Layout  One level    [place] - single point;Bedside commode;Toilet riser;Grab bars - toilet;Grab bars - tub/shower;Shower seat - built in    Additional Comments  handicapped accessible apartment      Prior Function   Level of Independence  Independent with basic ADLs    Vocation  On disability      Cognition   Overall Cognitive Status  Within Functional Limits for tasks assessed      Observation/Other Assessments   Focus on Therapeutic Outcomes (FOTO)   60% limited predicted 46% limited      Posture/Postural Control   Posture/Postural Control  Postural limitations    Postural Limitations  Forward head;Rounded Shoulders      ROM / Strength   AROM / PROM / Strength  AROM;PROM;Strength       AROM   AROM Assessment Site  Hip    Right/Left Hip  Right;Left    Right Hip Extension  5    Right Hip Flexion  75    Right Hip External Rotation   20    Right Hip Internal Rotation   25    Left Hip Extension  -15    Left Hip Flexion  45    Left Hip External Rotation   11    Left Hip Internal Rotation   25      PROM   PROM Assessment Site  Hip    Right/Left Hip  Left    Left Hip Extension  -8 from neutral    Left Hip Flexion  60    Left Hip External Rotation   20    Left Hip Internal Rotation   26      Strength   Strength Assessment Site  Hip    Right/Left Hip  Right;Left    Right Hip Flexion  4/5    Right Hip Extension  3/5    Right Hip External Rotation   5/5    Right Hip Internal Rotation  5/5    Right Hip ABduction  4/5    Right Hip ADduction  4/5    Left Hip Flexion  4-/5    Left Hip Extension  3-/5    Left Hip External Rotation  4/5    Left Hip Internal Rotation  4/5    Left Hip ABduction  3+/5    Left Hip ADduction  3+/5      Palpation   Palpation comment  TTP along proximal rectus femoris , glute medius      Ambulation/Gait   Ambulation/Gait  Yes    Assistive device  Straight cane    Gait Pattern  Step-to pattern;Step-through pattern;Decreased stride length;Trendelenburg;Trunk flexed             Objective measurements completed on examination: See above findings.              PT Education - 11/02/17 1504    Education provided  Yes    Education Details  evaluation findings, POC, goals, HEP with proper form and review of HHPT HEP.     Person(s) Educated  Patient    Methods  Explanation;Verbal cues;Handout    Comprehension  Verbalized understanding;Verbal cues required       PT Short Term Goals - 11/02/17 1511      PT SHORT TERM GOAL #1   Title  pt to be I with inital HEP given    Time  3    Period  Weeks    Status  New    Target Date  11/23/17      PT SHORT TERM GOAL #2   Title  pt to demo / verbalize proper posture and  liftin mechanics to prevent/ reduce hip / knee pain     Time  3    Period  Weeks    Status  New    Target Date  11/23/17      PT SHORT TERM GOAL #3   Title  perform Berg balance and write goals regarding assessment    Time  3    Period  Weeks    Status  New    Target Date  11/23/17        PT Long Term Goals - 11/02/17 1511      PT LONG TERM GOAL #1   Title  improve L hip flexion to >/= 90 degrees and extension to >/= 5 degrees to promote functional hip mobility required for functional gait pattern and navigating steps    Time  6    Period  Weeks    Status  New    Target Date  12/14/17      PT LONG TERM GOAL #2   Title  pt to increase L hip strength to >/= 4+/5 in all planes to promote hip stability with walking/ standing activites    Time  6    Period  Weeks    Status  New    Target Date  12/14/17      PT LONG TERM GOAL #3   Title  pt to be able to walk/ standing for >/= 20 min with LRAD with </= 1/10 pain for functional endurance required for community ambulation    Time  6    Period  Weeks    Status  New    Target Date  12/14/17      PT LONG TERM GOAL #4   Title  pt to increase FOTO score to </= 46% limited to demo improvement in function     Time  6    Period  Weeks    Status  New    Target Date  12/14/17             Plan - 11/02/17 1506    Clinical Impression Statement  pt presents to OPPT s/p L THA on 09/22/2017. limited hip mobility and strength assciated with expected findings following surgery. TTP along the rectus femoris and the glute med. She demonstrates an antalgic gait pattern with report of R knee pain with use of SPC. she would benefit from physical therapy to decrease L hip pain/ tightness, improve strength and mobility by addressing the deficits listed.     Clinical Presentation  Stable    Clinical Decision Making  Low    Rehab Potential  Good    PT Frequency  2x / week    PT Duration  6 weeks    PT Treatment/Interventions  ADLs/Self Care  Home Management;Ultrasound;Cryotherapy;Electrical Stimulation;Iontophoresis 4mg /ml Dexamethasone;Moist Heat;Neuromuscular re-education;Balance training;Dry needling;Gait training;Therapeutic activities;Therapeutic exercise;Manual techniques;Taping;Patient/family education;Passive range of motion;Scar mobilization;Stair training;Functional mobility training    PT Next Visit Plan  review and update HEP. BERG balance,  manual for rectus femoris, hip strengthening, gait training and modalities PRN    PT Home Exercise Plan  previous HHPT HEP, sidelying hip abduction, glute set, hip flexor stretching, adductor ball squeeze.     Consulted and Agree with Plan of Care  Patient       Patient will benefit from skilled therapeutic intervention in order to improve the following deficits and impairments:  Abnormal gait, Pain, Improper body mechanics, Postural dysfunction, Decreased strength, Decreased activity tolerance, Decreased balance, Decreased endurance, Obesity, Difficulty walking  Visit Diagnosis: Pain in left hip - Plan: PT plan of care cert/re-cert  Stiffness of left hip, not elsewhere classified - Plan: PT plan of care cert/re-cert  Other abnormalities of gait and mobility - Plan: PT plan of care cert/re-cert  Other muscle spasm - Plan: PT plan of care cert/re-cert     Problem List Patient Active Problem List   Diagnosis Date Noted  . History of hip replacement 09/22/2017  . Hyperlipidemia 08/10/2017  . Primary osteoarthritis of left hip 06/25/2017  . Primary osteoarthritis of right knee 06/25/2017  . Depression 06/25/2017  . Essential hypertension 06/25/2017  . Class 3 severe obesity due to excess calories without serious comorbidity with body mass index (BMI) of 45.0 to 49.9 in adult ([place]) 06/25/2017  . Tobacco abuse 06/25/2017  . Prediabetes 06/25/2017  . OSA on CPAP 06/25/2017   Starr [place] PT, DPT, LAT, ATC  11/02/17  3:30 PM      [facility] [address] [place], [place], [address] Phone: [phone]   Fax:  [phone]  Name: KANITRA PURIFOY MRN: [id number] Date of Birth: [birth year]

## 2017-11-04 ENCOUNTER — Ambulatory Visit: Payer: Self-pay | Admitting: Physical Therapy

## 2017-11-04 ENCOUNTER — Encounter: Payer: Self-pay | Admitting: Physical Therapy

## 2017-11-04 DIAGNOSIS — M25652 Stiffness of left hip, not elsewhere classified: Secondary | ICD-10-CM

## 2017-11-04 DIAGNOSIS — M62838 Other muscle spasm: Secondary | ICD-10-CM

## 2017-11-04 DIAGNOSIS — M25552 Pain in left hip: Secondary | ICD-10-CM

## 2017-11-04 DIAGNOSIS — R2689 Other abnormalities of gait and mobility: Secondary | ICD-10-CM

## 2017-11-04 NOTE — Therapy (Signed)
[place], [place], [address] Phone: [phone]   Fax:  [phone]  Physical Therapy Treatment  Patient Details  Name: Sarah Randall MRN: [id number] Date of Birth: [birth year] Referring Provider: Leandrew Koyanagi, MD   Encounter Date: 11/04/2017  PT End of Session - 11/04/17 1307    Visit Number  2    Number of Visits  13    Date for PT Re-Evaluation  12/14/17    Authorization Type  CAFA    PT Start Time  0100    PT Stop Time  0148    PT Time Calculation (min)  48 min       Past Medical History:  Diagnosis Date  . Arthritis   . Diabetes mellitus without complication ([place])   . GERD (gastroesophageal reflux disease)    occasionally takes OTC  . Hypertension   . Sleep apnea    tested 2014 - unable to tolerate machine    Past Surgical History:  Procedure Laterality Date  . TOTAL HIP ARTHROPLASTY Left 09/22/2017   Procedure: LEFT TOTAL HIP ARTHROPLASTY ANTERIOR APPROACH;  Surgeon: Leandrew Koyanagi, MD;  Location: [place];  Service: Orthopedics;  Laterality: Left;  . TUBAL LIGATION      There were no vitals filed for this visit.  Subjective Assessment - 11/04/17 1306    Subjective  My hip was really tight last night.     Currently in Pain?  Yes    Pain Score  4     Pain Location  Hip    Pain Orientation  Left                      [facility] Adult PT Treatment/Exercise - 11/04/17 0001      Ambulation/Gait   Gait Comments  gait with and without SPC, cues to right knee flexion and decreased circumduction during gait.       Knee/Hip Exercises: Stretches   Hip Flexor Stretch  3 reps;30 seconds      Knee/Hip Exercises: Aerobic   Nustep  L4 UE/LE x 5 minutes       Knee/Hip Exercises: Standing   Hip Flexion  10 reps    Forward Lunges Limitations  for hip flexor stretch 10 sec x 3 each     Hip Abduction  10 reps      Knee/Hip Exercises: Supine   Other Supine Knee/Hip Exercises   adduction 10 sec x 10    Other Supine Knee/Hip Exercises  glute sets 10 sec x 10       Knee/Hip Exercises: Sidelying   Hip ABduction  10 reps      Modalities   Modalities  Cryotherapy      Cryotherapy   Number Minutes Cryotherapy  10 Minutes    Cryotherapy Location  Hip    Type of Cryotherapy  Ice pack               PT Short Term Goals - 11/02/17 1511      PT SHORT TERM GOAL #1   Title  pt to be I with inital HEP given    Time  3    Period  Weeks    Status  New    Target Date  11/23/17      PT SHORT TERM GOAL #2   Title  pt to demo / verbalize proper posture and liftin mechanics to prevent/ reduce hip / knee pain  Time  3    Period  Weeks    Status  New    Target Date  11/23/17      PT SHORT TERM GOAL #3   Title  perform Berg balance and write goals regarding assessment    Time  3    Period  Weeks    Status  New    Target Date  11/23/17        PT Long Term Goals - 11/02/17 1511      PT LONG TERM GOAL #1   Title  improve L hip flexion to >/= 90 degrees and extension to >/= 5 degrees to promote functional hip mobility required for functional gait pattern and navigating steps    Time  6    Period  Weeks    Status  New    Target Date  12/14/17      PT LONG TERM GOAL #2   Title  pt to increase L hip strength to >/= 4+/5 in all planes to promote hip stability with walking/ standing activites    Time  6    Period  Weeks    Status  New    Target Date  12/14/17      PT LONG TERM GOAL #3   Title  pt to be able to walk/ standing for >/= 20 min with LRAD with </= 1/10 pain for functional endurance required for community ambulation    Time  6    Period  Weeks    Status  New    Target Date  12/14/17      PT LONG TERM GOAL #4   Title  pt to increase FOTO score to </= 46% limited to demo improvement in function     Time  6    Period  Weeks    Status  New    Target Date  12/14/17            Plan - 11/04/17 1347    Clinical Impression Statement   Pt arrives reporting she cannot do the side hip abduction however executes it well during treatment today. Reviewed all HEP and used massage roller huring hip flexor stretch to ateriolateral thigh with pt repoting decreased tightness. Gait training provided with cues for step equal step length and right knee flexion with gait to decrease right hip circumduction. She has difficulty sustaining correct cane pattern.     PT Next Visit Plan  review and update HEP. BERG balance NEXT ,  manual for rectus femoris, hip strengthening, gait training and modalities PRN    PT Home Exercise Plan  previous HHPT HEP, sidelying hip abduction, glute set, hip flexor stretching, adductor ball squeeze.     Consulted and Agree with Plan of Care  Patient       Patient will benefit from skilled therapeutic intervention in order to improve the following deficits and impairments:  Abnormal gait, Pain, Improper body mechanics, Postural dysfunction, Decreased strength, Decreased activity tolerance, Decreased balance, Decreased endurance, Obesity, Difficulty walking  Visit Diagnosis: Pain in left hip  Stiffness of left hip, not elsewhere classified  Other abnormalities of gait and mobility  Other muscle spasm     Problem List Patient Active Problem List   Diagnosis Date Noted  . History of hip replacement 09/22/2017  . Hyperlipidemia 08/10/2017  . Primary osteoarthritis of left hip 06/25/2017  . Primary osteoarthritis of right knee 06/25/2017  . Depression 06/25/2017  . Essential hypertension 06/25/2017  . Class 3  severe obesity due to excess calories without serious comorbidity with body mass index (BMI) of 45.0 to 49.9 in adult ([place]) 06/25/2017  . Tobacco abuse 06/25/2017  . Prediabetes 06/25/2017  . OSA on CPAP 06/25/2017    Dorene Ar, PTA 11/04/2017, 1:50 PM  [facility] [address] [place], [place], [address] Phone: [phone]   Fax:   [phone]  Name: Sarah Randall MRN: [id number] Date of Birth: 1966-04-10

## 2017-11-08 ENCOUNTER — Encounter (INDEPENDENT_AMBULATORY_CARE_PROVIDER_SITE_OTHER): Payer: Self-pay | Admitting: Orthopaedic Surgery

## 2017-11-08 ENCOUNTER — Ambulatory Visit (INDEPENDENT_AMBULATORY_CARE_PROVIDER_SITE_OTHER): Payer: Self-pay | Admitting: Orthopaedic Surgery

## 2017-11-08 DIAGNOSIS — Z96642 Presence of left artificial hip joint: Secondary | ICD-10-CM

## 2017-11-08 MED ORDER — HYDROCODONE-ACETAMINOPHEN 5-325 MG PO TABS: 1.0000 | ORAL_TABLET | Freq: Every evening | ORAL | 0 refills | Status: DC | PRN

## 2017-11-08 NOTE — Progress Notes (Signed)
Patient is approximately 6-7 weeks status post left total hip replacement and doing well progressing.  She is happy with the improvement from her surgery.  She is ambulating with a cane.  Right knee is bothering her significantly and she is interested in having this replaced in about a month and a half or so.  Overall she is doing well and progressing with physical therapy.  She is complaining of some mild groin pain.  Surgical incision is healed overall.  There is no drainage or signs of infection.  At this point she can discontinue DVT prophylaxis.  She is progressing appropriately.  I will see her back in 6 weeks with AP pelvis and lateral left hip and 3 view x-rays of the right knee.  Dental prophylaxis was reinforced today.

## 2017-11-10 ENCOUNTER — Encounter: Payer: Self-pay | Admitting: Physical Therapy

## 2017-11-10 ENCOUNTER — Other Ambulatory Visit: Payer: Self-pay

## 2017-11-10 ENCOUNTER — Ambulatory Visit: Payer: Self-pay | Attending: Orthopaedic Surgery | Admitting: Physical Therapy

## 2017-11-10 DIAGNOSIS — M62838 Other muscle spasm: Secondary | ICD-10-CM | POA: Insufficient documentation

## 2017-11-10 DIAGNOSIS — M25652 Stiffness of left hip, not elsewhere classified: Secondary | ICD-10-CM | POA: Insufficient documentation

## 2017-11-10 DIAGNOSIS — M25552 Pain in left hip: Secondary | ICD-10-CM | POA: Insufficient documentation

## 2017-11-10 DIAGNOSIS — R2689 Other abnormalities of gait and mobility: Secondary | ICD-10-CM | POA: Insufficient documentation

## 2017-11-10 NOTE — Therapy (Signed)
[place], [place], [address] Phone: [phone]   Fax:  [phone]  Physical Therapy Treatment  Patient Details  Name: Sarah Randall MRN: [id number] Date of Birth: 1966/06/27 Referring Provider: Leandrew Koyanagi, MD   Encounter Date: 11/10/2017  PT End of Session - 11/10/17 0842    Visit Number  3    Number of Visits  13    Date for PT Re-Evaluation  12/14/17    Authorization Type  CAFA    PT Start Time  0800    PT Stop Time  0851    PT Time Calculation (min)  51 min    Activity Tolerance  Patient tolerated treatment well    Behavior During Therapy  [facility] for tasks assessed/performed       Past Medical History:  Diagnosis Date  . Arthritis   . Diabetes mellitus without complication ([place])   . GERD (gastroesophageal reflux disease)    occasionally takes OTC  . Hypertension   . Sleep apnea    tested 2014 - unable to tolerate machine    Past Surgical History:  Procedure Laterality Date  . TOTAL HIP ARTHROPLASTY Left 09/22/2017   Procedure: LEFT TOTAL HIP ARTHROPLASTY ANTERIOR APPROACH;  Surgeon: Leandrew Koyanagi, MD;  Location: [place];  Service: Orthopedics;  Laterality: Left;  . TUBAL LIGATION      There were no vitals filed for this visit.  Subjective Assessment - 11/10/17 0802    Subjective  Pt. reports 4/10 pain in L hip and states "I'm doing pretty good, most of my pain is at night". Pt. also reports R knee is pain and has surgery scheduled for the end of January or early February.     Currently in Pain?  Yes    Pain Score  4     Pain Location  Hip    Pain Orientation  Left       [facility] Adult PT Treatment/Exercise - 11/10/17 0804      Knee/Hip Exercises: Stretches   Active Hamstring Stretch  Left;2 reps;30 seconds supine, with strap    Quad Stretch  Left;2 reps;30 seconds prone, with strap    Hip Flexor Stretch  Left;2 reps;30 seconds supine 4      Knee/Hip Exercises: Aerobic   Nustep   L6 x 5 minutes, UE only      Knee/Hip Exercises: Standing   Other Standing Knee Exercises  lateral band walks in parallel bars; down and back; 3 sets; yellow theraband around ankles, pt. recieved cues to avoid out-toeing during exercise      Knee/Hip Exercises: Seated   Marching  AROM;Strengthening;Both;2 sets 30 seconds, yellow theraband around feet     Sit to Sand  2 sets;10 reps;without UE support yellow theraband around knees      Modalities   Modalities  Cryotherapy      Cryotherapy   Number Minutes Cryotherapy  10 Minutes    Cryotherapy Location  Hip    Type of Cryotherapy  Ice pack      Manual Therapy   Manual Therapy  Soft tissue mobilization    Soft tissue mobilization  IASTM to L quadriceps to facilitate tissue mobility for increased ability to perform therapuetic exercise       PT Short Term Goals - 11/02/17 1511      PT SHORT TERM GOAL #1   Title  pt to be I with inital HEP given    Time  3  Period  Weeks    Status  New    Target Date  11/23/17      PT SHORT TERM GOAL #2   Title  pt to demo / verbalize proper posture and liftin mechanics to prevent/ reduce hip / knee pain     Time  3    Period  Weeks    Status  New    Target Date  11/23/17      PT SHORT TERM GOAL #3   Title  perform Berg balance and write goals regarding assessment    Time  3    Period  Weeks    Status  New    Target Date  11/23/17        PT Long Term Goals - 11/02/17 1511      PT LONG TERM GOAL #1   Title  improve L hip flexion to >/= 90 degrees and extension to >/= 5 degrees to promote functional hip mobility required for functional gait pattern and navigating steps    Time  6    Period  Weeks    Status  New    Target Date  12/14/17      PT LONG TERM GOAL #2   Title  pt to increase L hip strength to >/= 4+/5 in all planes to promote hip stability with walking/ standing activites    Time  6    Period  Weeks    Status  New    Target Date  12/14/17      PT LONG TERM GOAL  #3   Title  pt to be able to walk/ standing for >/= 20 min with LRAD with </= 1/10 pain for functional endurance required for community ambulation    Time  6    Period  Weeks    Status  New    Target Date  12/14/17      PT LONG TERM GOAL #4   Title  pt to increase FOTO score to </= 46% limited to demo improvement in function     Time  6    Period  Weeks    Status  New    Target Date  12/14/17       Plan - 11/10/17 0913    Clinical Impression Statement  Pt. presents with improving pain in L hip and is progressing with strength. Session emphasized therapeutic exercise to improve standing tolerance and progress hip LE strength. Pt. tolerated treatment and was able to complete all exercise, however noted pain in R knee. Pt. noted increases soreness post-session and requested ice.    PT Treatment/Interventions  ADLs/Self Care Home Management;Ultrasound;Cryotherapy;Electrical Stimulation;Iontophoresis 4mg /ml Dexamethasone;Moist Heat;Neuromuscular re-education;Balance training;Dry needling;Gait training;Therapeutic activities;Therapeutic exercise;Manual techniques;Taping;Patient/family education;Passive range of motion;Scar mobilization;Stair training;Functional mobility training    PT Next Visit Plan  BERG balance, review and update HEP, manual therapy PRN, hip strengthening, gait training, stairs    PT Home Exercise Plan  previous HHPT HEP, sidelying hip abduction, glute set, hip flexor stretching, adductor ball squeeze.     Consulted and Agree with Plan of Care  Patient       Patient will benefit from skilled therapeutic intervention in order to improve the following deficits and impairments:  Abnormal gait, Pain, Improper body mechanics, Postural dysfunction, Decreased strength, Decreased activity tolerance, Decreased balance, Decreased endurance, Obesity, Difficulty walking  Visit Diagnosis: Pain in left hip  Stiffness of left hip, not elsewhere classified  Other abnormalities of gait  and mobility  Other muscle spasm  Problem List Patient Active Problem List   Diagnosis Date Noted  . History of hip replacement 09/22/2017  . Hyperlipidemia 08/10/2017  . Primary osteoarthritis of left hip 06/25/2017  . Primary osteoarthritis of right knee 06/25/2017  . Depression 06/25/2017  . Essential hypertension 06/25/2017  . Class 3 severe obesity due to excess calories without serious comorbidity with body mass index (BMI) of 45.0 to 49.9 in adult ([place]) 06/25/2017  . Tobacco abuse 06/25/2017  . Prediabetes 06/25/2017  . OSA on CPAP 06/25/2017    Moo Gravley 11/10/2017, 12:30 PM  [facility] ([facility] [address] [place], [place], [address] Phone: [phone]   Fax:  [phone]  Name: Sarah Randall MRN: [id number] Date of Birth: [birth year]

## 2017-11-12 ENCOUNTER — Encounter: Payer: Self-pay | Admitting: Physical Therapy

## 2017-11-16 ENCOUNTER — Ambulatory Visit: Payer: Self-pay | Admitting: Physical Therapy

## 2017-11-16 ENCOUNTER — Encounter: Payer: Self-pay | Admitting: Physical Therapy

## 2017-11-16 DIAGNOSIS — M25652 Stiffness of left hip, not elsewhere classified: Secondary | ICD-10-CM

## 2017-11-16 DIAGNOSIS — M62838 Other muscle spasm: Secondary | ICD-10-CM

## 2017-11-16 DIAGNOSIS — M25552 Pain in left hip: Secondary | ICD-10-CM

## 2017-11-16 DIAGNOSIS — R2689 Other abnormalities of gait and mobility: Secondary | ICD-10-CM

## 2017-11-16 NOTE — Therapy (Signed)
[place], [place], [address] Phone: [phone]   Fax:  [phone]  Physical Therapy Treatment  Patient Details  Name: Sarah Randall MRN: [id number] Date of Birth: [birth year] Referring Provider: Leandrew Koyanagi, MD   Encounter Date: 11/16/2017  PT End of Session - 11/16/17 1503    Visit Number  4    Number of Visits  13    Date for PT Re-Evaluation  12/14/17    Authorization Type  CAFA    PT Start Time  1416    PT Stop Time  1512    PT Time Calculation (min)  56 min    Activity Tolerance  Patient tolerated treatment well    Behavior During Therapy  [facility] for tasks assessed/performed       Past Medical History:  Diagnosis Date  . Arthritis   . Diabetes mellitus without complication ([place])   . GERD (gastroesophageal reflux disease)    occasionally takes OTC  . Hypertension   . Sleep apnea    tested 2014 - unable to tolerate machine    Past Surgical History:  Procedure Laterality Date  . TOTAL HIP ARTHROPLASTY Left 09/22/2017   Procedure: LEFT TOTAL HIP ARTHROPLASTY ANTERIOR APPROACH;  Surgeon: Leandrew Koyanagi, MD;  Location: [place];  Service: Orthopedics;  Laterality: Left;  . TUBAL LIGATION      There were no vitals filed for this visit.  Subjective Assessment - 11/16/17 1417    Subjective  Pt. reports "my knee is giving me a fit today". Pt. also reports sharp pain in lt. hip that still occurs more commonly at night. Pt. notes that pain remains in groin area and now also radiates to lt. buttocks.    Currently in Pain?  Yes    Pain Score  5     Pain Location  Hip    Pain Orientation  Left    Pain Descriptors / Indicators  Sharp    Multiple Pain Sites  Yes    Pain Score  9    Pain Location  Knee    Pain Orientation  Right        [facility] Adult PT Treatment/Exercise - 11/16/17 1426      Ambulation/Gait   Ambulation/Gait  Yes    Assistive device  Straight cane    Gait Pattern  Step-to  pattern;Decreased step length - left;Decreased stance time - right;Antalgic      Knee/Hip Exercises: Aerobic   Nustep  L6 x 6 minutes, LE only      Knee/Hip Exercises: Standing   Hip Extension  AROM;Left;1 set;10 reps;Knee straight;Limitations 2.5 lbs. ankle weight     Extension Limitations  recieved tactile cues to maintain posture avoid hip hinging; pt. limited by pain with WB in rt. knee, only able to tolerate standing long enough to complete 1 set, required increased rest time after exercise      Knee/Hip Exercises: Seated   Other Seated Knee/Hip Exercises  ball squeeze w/ knee extension; 2 sets; 10 reps  pt. noted decreased pain in rt. knee post-exercise      Knee/Hip Exercises: Supine   Bridges  Strengthening;Both;2 sets;10 reps pt. unable to lift completely from table     Other Supine Knee/Hip Exercises  clamshell; green theraband around knees; 2 sets; 10 reps       Knee/Hip Exercises: Sidelying   Hip ABduction  AROM;Strengthening;Left;2 sets;10 reps pt. received tactile cues for appropriate abduction height  Modalities   Modalities  Moist Heat      Moist Heat Therapy   Number Minutes Moist Heat  10 Minutes    Moist Heat Location  Knee;Hip rt knee, lt hip      Manual Therapy   Manual Therapy  Soft tissue mobilization;Taping    Soft tissue mobilization  Rolling tool to lt. posterior/lateral thigh for pain relief, pt. noted having difficulty with prone laying so pt. was positioned in rt. sidelying    McConnell  Rt. patella taped medially to promote proper tracking for pain relief        PT Short Term Goals - 11/02/17 1511      PT SHORT TERM GOAL #1   Title  pt to be I with inital HEP given    Time  3    Period  Weeks    Status  New    Target Date  11/23/17      PT SHORT TERM GOAL #2   Title  pt to demo / verbalize proper posture and liftin mechanics to prevent/ reduce hip / knee pain     Time  3    Period  Weeks    Status  New    Target Date  11/23/17      PT  SHORT TERM GOAL #3   Title  perform Berg balance and write goals regarding assessment    Time  3    Period  Weeks    Status  New    Target Date  11/23/17       PT Long Term Goals - 11/02/17 1511      PT LONG TERM GOAL #1   Title  improve L hip flexion to >/= 90 degrees and extension to >/= 5 degrees to promote functional hip mobility required for functional gait pattern and navigating steps    Time  6    Period  Weeks    Status  New    Target Date  12/14/17      PT LONG TERM GOAL #2   Title  pt to increase L hip strength to >/= 4+/5 in all planes to promote hip stability with walking/ standing activites    Time  6    Period  Weeks    Status  New    Target Date  12/14/17      PT LONG TERM GOAL #3   Title  pt to be able to walk/ standing for >/= 20 min with LRAD with </= 1/10 pain for functional endurance required for community ambulation    Time  6    Period  Weeks    Status  New    Target Date  12/14/17      PT LONG TERM GOAL #4   Title  pt to increase FOTO score to </= 46% limited to demo improvement in function     Time  6    Period  Weeks    Status  New    Target Date  12/14/17       Plan - 11/16/17 1527    Clinical Impression Statement  Pt. presents to therapy today ambulating with SPC and antalgic gait pattern due to increased pain in rt. knee. Pt. is progressing with lt. hip pain however knee pain limited today's session. Pt. was able to complete therapeutic exercise in supine and seated positions, however noted difficulty with increased rest required for standing exercise (due to rt. knee pain). Pt. received manual therapy and taping  for attempted pain relief and noted slight relief of pain symptoms. Pt. received moist heat post-session.     PT Treatment/Interventions  ADLs/Self Care Home Management;Ultrasound;Cryotherapy;Electrical Stimulation;Iontophoresis 4mg /ml Dexamethasone;Moist Heat;Neuromuscular re-education;Balance training;Dry needling;Gait  training;Therapeutic activities;Therapeutic exercise;Manual techniques;Taping;Patient/family education;Passive range of motion;Scar mobilization;Stair training;Functional mobility training    PT Next Visit Plan  BERG balance, review and update HEP, manual therapy PRN, hip strengthening, gait training, stairs    PT Home Exercise Plan  previous HHPT HEP, sidelying hip abduction, glute set, hip flexor stretching, adductor ball squeeze.     Consulted and Agree with Plan of Care  Patient       Patient will benefit from skilled therapeutic intervention in order to improve the following deficits and impairments:  Abnormal gait, Pain, Improper body mechanics, Postural dysfunction, Decreased strength, Decreased activity tolerance, Decreased balance, Decreased endurance, Obesity, Difficulty walking  Visit Diagnosis: Pain in left hip  Stiffness of left hip, not elsewhere classified  Other abnormalities of gait and mobility  Other muscle spasm     Problem List Patient Active Problem List   Diagnosis Date Noted  . History of hip replacement 09/22/2017  . Hyperlipidemia 08/10/2017  . Primary osteoarthritis of left hip 06/25/2017  . Primary osteoarthritis of right knee 06/25/2017  . Depression 06/25/2017  . Essential hypertension 06/25/2017  . Class 3 severe obesity due to excess calories without serious comorbidity with body mass index (BMI) of 45.0 to 49.9 in adult ([place]) 06/25/2017  . Tobacco abuse 06/25/2017  . Prediabetes 06/25/2017  . OSA on CPAP 06/25/2017    Eulis Manly, SPT 11/16/17 3:34 PM   [facility] [address] [place], [place], [address] Phone: [phone]   Fax:  [phone]  Name: Sarah Randall MRN: [id number] Date of Birth: 15-Jan-1966

## 2017-11-17 ENCOUNTER — Telehealth (INDEPENDENT_AMBULATORY_CARE_PROVIDER_SITE_OTHER): Payer: Self-pay | Admitting: Orthopaedic Surgery

## 2017-11-17 NOTE — Telephone Encounter (Signed)
Patient requests RX refill on hydrocodone/with aspirin. She asks if you can prescribe her the 7 mg again because she is having to take 1.5 pills at night of the 5 mg. CB # [phone]

## 2017-11-17 NOTE — Telephone Encounter (Signed)
I just called patient, I did not mean to send this to you.  I told her to supplement with advil or aleve, she did not ask for refill, so we can forego for now.  FYI, sorry, thanks.

## 2017-11-17 NOTE — Telephone Encounter (Signed)
We can't go up with the dosage now that she's 8 weeks out.  She should supplement with advil or aleve

## 2017-11-17 NOTE — Telephone Encounter (Signed)
Ok to refill #30 of norco 5/325

## 2017-11-17 NOTE — Telephone Encounter (Signed)
See message below °

## 2017-11-18 ENCOUNTER — Encounter: Payer: Self-pay | Admitting: Physical Therapy

## 2017-11-18 ENCOUNTER — Ambulatory Visit: Payer: Self-pay | Admitting: Physical Therapy

## 2017-11-18 DIAGNOSIS — M25652 Stiffness of left hip, not elsewhere classified: Secondary | ICD-10-CM

## 2017-11-18 DIAGNOSIS — M25552 Pain in left hip: Secondary | ICD-10-CM

## 2017-11-18 DIAGNOSIS — M62838 Other muscle spasm: Secondary | ICD-10-CM

## 2017-11-18 DIAGNOSIS — R2689 Other abnormalities of gait and mobility: Secondary | ICD-10-CM

## 2017-11-18 NOTE — Therapy (Signed)
[place], [place], [address] Phone: [phone]   Fax:  [phone]  Physical Therapy Treatment  Patient Details  Name: Sarah Randall MRN: [id number] Date of Birth: Jan 17, 1966 Referring Provider: Leandrew Koyanagi, MD   Encounter Date: 11/18/2017  PT End of Session - 11/18/17 1617    Visit Number  5    Number of Visits  13    Date for PT Re-Evaluation  12/14/17    PT Start Time  5093    PT Stop Time  1510    PT Time Calculation (min)  55 min    Activity Tolerance  Patient tolerated treatment well    Behavior During Therapy  [facility] for tasks assessed/performed       Past Medical History:  Diagnosis Date  . Arthritis   . Diabetes mellitus without complication ([place])   . GERD (gastroesophageal reflux disease)    occasionally takes OTC  . Hypertension   . Sleep apnea    tested 2014 - unable to tolerate machine    Past Surgical History:  Procedure Laterality Date  . TOTAL HIP ARTHROPLASTY Left 09/22/2017   Procedure: LEFT TOTAL HIP ARTHROPLASTY ANTERIOR APPROACH;  Surgeon: Leandrew Koyanagi, MD;  Location: [place];  Service: Orthopedics;  Laterality: Left;  . TUBAL LIGATION      There were no vitals filed for this visit.  Subjective Assessment - 11/18/17 1422    Subjective  Pain at night continues but is improving.  Now sleeping in bed 2 nights.    Currently in Pain?  Yes    Pain Score  4  at night  7-8/10    Pain Location  Hip    Pain Orientation  Left;Posterior;Anterior    Pain Type  Surgical pain    Pain Radiating Towards  no    Pain Frequency  Intermittent    Aggravating Factors   night time  ,  longer standing ,  walking    Pain Relieving Factors  ice pain    Multiple Pain Sites  Yes right knee  up to 10.     Pain Score  10    Pain Location  Knee    Pain Orientation  Right sitting standing walking too long,  catching toe,   ice,, heat         [facility] PT Assessment - 11/18/17 0001      Berg  Balance Test   Sit to Stand  Able to stand without using hands and stabilize independently    Standing Unsupported  Able to stand safely 2 minutes    Sitting with Back Unsupported but Feet Supported on Floor or Stool  Able to sit safely and securely 2 minutes    Stand to Sit  Sits safely with minimal use of hands    Transfers  Able to transfer safely, minor use of hands    Standing Unsupported with Eyes Closed  Able to stand 10 seconds safely    Standing Ubsupported with Feet Together  Able to place feet together independently and stand 1 minute safely 4,  leg deformity from right knee  unable to get toes togeth    From Standing, Reach Forward with Outstretched Arm  Can reach confidently >25 cm (10")    From Standing Position, Pick up Object from Floor  Able to pick up shoe, needs supervision    From Standing Position, Turn to Look Behind Over each Shoulder  Looks behind from both sides  and weight shifts well    Turn 360 Degrees  Able to turn 360 degrees safely but slowly    Standing Unsupported, Alternately Place Feet on Step/Stool  Able to stand independently and safely and complete 8 steps in 20 seconds 15  seconds    Standing Unsupported, One Foot in [employer] balance while stepping or standing    Standing on One Leg  Able to lift leg independently and hold > 10 seconds left leg    Total Score  49                  [facility] Adult PT Treatment/Exercise - 11/18/17 0001      Lumbar Exercises: Stretches   Single Knee to Chest Stretch  3 reps;30 seconds mild discomfort.  Range increased with reps.    Hip Flexor Stretch  -- discomfort      Knee/Hip Exercises: Stretches   Passive Hamstring Stretch  3 reps;30 seconds    Quad Stretch  1 rep;20 seconds    Hip Flexor Stretch  1 rep;20 seconds discomfort    Other Knee/Hip Stretches  Adductor stretch 2 x 30.  PROM    Other Knee/Hip Stretches  IR/ER stretches 3 X 15 seconds each       Knee/Hip Exercises: Sidelying   Hip ABduction   AAROM      Moist Heat Therapy   Number Minutes Moist Heat  -- concurrent with stretching    Moist Heat Location  Knee;Hip      Manual Therapy   Manual Therapy  Soft tissue mobilization    Manual therapy comments  adductors, anterior and lateral hip..  tissue softened.    Soft tissue mobilization  scAR TISSUE WORK ANTERIOR HIP.             PT Education - 11/18/17 1616    Education provided  Yes    Education Details  heat helpful with soft tissue work and stretching    Person(s) Educated  Patient    Methods  Explanation    Comprehension  Verbalized understanding       PT Short Term Goals - 11/02/17 1511      PT SHORT TERM GOAL #1   Title  pt to be I with inital HEP given    Time  3    Period  Weeks    Status  New    Target Date  11/23/17      PT SHORT TERM GOAL #2   Title  pt to demo / verbalize proper posture and liftin mechanics to prevent/ reduce hip / knee pain     Time  3    Period  Weeks    Status  New    Target Date  11/23/17      PT SHORT TERM GOAL #3   Title  perform Berg balance and write goals regarding assessment    Time  3    Period  Weeks    Status  New    Target Date  11/23/17        PT Long Term Goals - 11/02/17 1511      PT LONG TERM GOAL #1   Title  improve L hip flexion to >/= 90 degrees and extension to >/= 5 degrees to promote functional hip mobility required for functional gait pattern and navigating steps    Time  6    Period  Weeks    Status  New    Target Date  12/14/17  PT LONG TERM GOAL #2   Title  pt to increase L hip strength to >/= 4+/5 in all planes to promote hip stability with walking/ standing activites    Time  6    Period  Weeks    Status  New    Target Date  12/14/17      PT LONG TERM GOAL #3   Title  pt to be able to walk/ standing for >/= 20 min with LRAD with </= 1/10 pain for functional endurance required for community ambulation    Time  6    Period  Weeks    Status  New    Target Date  12/14/17       PT LONG TERM GOAL #4   Title  pt to increase FOTO score to </= 46% limited to demo improvement in function     Time  6    Period  Weeks    Status  New    Target Date  12/14/17            Plan - 11/18/17 1617    Clinical Impression Statement  BERG 49/56.  ROM gradually improving.  She has been sleeping in her bed for last 2 days. Pain/ tightness improved post session.  "I feel looser"    STG #3 partially met    PT Next Visit Plan  BERG balance  PT to explain result of test and write goals. , review and update HEP, manual therapy PRN, hip strengthening, gait training, stairs,  manual    PT Home Exercise Plan  previous HHPT HEP, sidelying hip abduction, glute set, hip flexor stretching, adductor ball squeeze.     Consulted and Agree with Plan of Care  Patient       Patient will benefit from skilled therapeutic intervention in order to improve the following deficits and impairments:     Visit Diagnosis: Pain in left hip  Stiffness of left hip, not elsewhere classified  Other abnormalities of gait and mobility  Other muscle spasm     Problem List Patient Active Problem List   Diagnosis Date Noted  . History of hip replacement 09/22/2017  . Hyperlipidemia 08/10/2017  . Primary osteoarthritis of left hip 06/25/2017  . Primary osteoarthritis of right knee 06/25/2017  . Depression 06/25/2017  . Essential hypertension 06/25/2017  . Class 3 severe obesity due to excess calories without serious comorbidity with body mass index (BMI) of 45.0 to 49.9 in adult ([place]) 06/25/2017  . Tobacco abuse 06/25/2017  . Prediabetes 06/25/2017  . OSA on CPAP 06/25/2017    HARRIS,KAREN PTA 11/18/2017, 4:22 PM  [place], [place], [address] Phone: [phone]   Fax:  [phone]  Name: Sarah Randall MRN: [id number] Date of Birth: 06/14/1966

## 2017-11-23 ENCOUNTER — Encounter: Payer: Self-pay | Admitting: Physical Therapy

## 2017-11-23 ENCOUNTER — Ambulatory Visit: Payer: Self-pay | Admitting: Physical Therapy

## 2017-11-23 DIAGNOSIS — M25552 Pain in left hip: Secondary | ICD-10-CM

## 2017-11-23 DIAGNOSIS — M62838 Other muscle spasm: Secondary | ICD-10-CM

## 2017-11-23 DIAGNOSIS — R2689 Other abnormalities of gait and mobility: Secondary | ICD-10-CM

## 2017-11-23 DIAGNOSIS — M25652 Stiffness of left hip, not elsewhere classified: Secondary | ICD-10-CM

## 2017-11-23 NOTE — Therapy (Signed)
[place], [place], [address] Phone: [phone]   Fax:  [phone]  Physical Therapy Treatment  Patient Details  Name: Sarah Randall MRN: [id number] Date of Birth: [birth year] Referring Provider: Leandrew Koyanagi, MD   Encounter Date: 11/23/2017  PT End of Session - 11/23/17 1809    Visit Number  [id number]    PT Start Time  2426    PT Stop Time  1515    PT Time Calculation (min)  57 min    Activity Tolerance  Patient tolerated treatment well    Behavior During Therapy  [facility] for tasks assessed/performed       Past Medical History:  Diagnosis Date  . Arthritis   . Diabetes mellitus without complication ([place])   . GERD (gastroesophageal reflux disease)    occasionally takes OTC  . Hypertension   . Sleep apnea    tested 2014 - unable to tolerate machine    Past Surgical History:  Procedure Laterality Date  . TOTAL HIP ARTHROPLASTY Left 09/22/2017   Procedure: LEFT TOTAL HIP ARTHROPLASTY ANTERIOR APPROACH;  Surgeon: Leandrew Koyanagi, MD;  Location: [place];  Service: Orthopedics;  Laterality: Left;  . TUBAL LIGATION      There were no vitals filed for this visit.  Subjective Assessment - 11/23/17 1423    Subjective  Hip OK,  knee has me in tears.  Over weekend worse.     Still able to sleepin the bed.     Can sleep on the surgery side.     Currently in Pain?  No/denies    Pain Location  Hip    Pain Orientation  Left    Pain Descriptors / Indicators  Tightness    Pain Frequency  Intermittent    Aggravating Factors   out in community,  getting in out of car I get tired    Pain Relieving Factors  tylenol,   heat,  rest    Pain Score  10    Pain Location  Knee    Pain Orientation  Right up to 20 at times.  Better with ice.                        [place] Adult PT Treatment/Exercise - 11/23/17 0001      Knee/Hip Exercises: Stretches   Passive Hamstring Stretch  3 reps;30 seconds    Quad  Stretch  1 rep;20 seconds    Hip Flexor Stretch  1 rep;20 seconds discomfort    Other Knee/Hip Stretches  Adductor stretch 2 x 30.  PROM    Other Knee/Hip Stretches  IR/ER stretches 3 X 15 seconds each       Knee/Hip Exercises: Standing   Hip Flexion  10 reps care taken to avoid knee pain    Hip Abduction  10 reps    Forward Step Up  Left;1 set;10 reps;Hand Hold: 1;Step Height: 4"    SLS  13 seconds      Knee/Hip Exercises: Supine   Heel Slides  20 reps leg on ball      Modalities   Modalities  Moist Heat      Moist Heat Therapy   Number Minutes Moist Heat  10 Minutes also used during some stretching    Moist Heat Location  Knee;Hip      Manual Therapy   Manual Therapy  Soft tissue mobilization    Manual therapy comments  adductors, anterior and  lateral hip..  tissue softened.    Soft tissue mobilization  scAR TISSUE WORK ANTERIOR HIP.             PT Education - 11/23/17 1458    Education provided  Yes    Education Details  OK to use walker for knee pain    Person(s) Educated  Patient    Methods  Explanation    Comprehension  Verbalized understanding       PT Short Term Goals - 11/23/17 1812      PT SHORT TERM GOAL #1   Title  pt to be I with inital HEP given    Baseline  independent so far    Time  3    Period  Weeks    Status  Partially Met      PT SHORT TERM GOAL #2   Title  pt to demo / verbalize proper posture and liftin mechanics to prevent/ reduce hip / knee pain     Baseline  knee limits good mechanics.      Time  3    Period  Weeks    Status  On-going      PT SHORT TERM GOAL #3   Title  perform Berg balance and write goals regarding assessment    Baseline  initial test done last visit    Time  3    Period  Weeks    Status  Partially Met        PT Long Term Goals - 11/02/17 1511      PT LONG TERM GOAL #1   Title  improve L hip flexion to >/= 90 degrees and extension to >/= 5 degrees to promote functional hip mobility required for  functional gait pattern and navigating steps    Time  6    Period  Weeks    Status  New    Target Date  12/14/17      PT LONG TERM GOAL #2   Title  pt to increase L hip strength to >/= 4+/5 in all planes to promote hip stability with walking/ standing activites    Time  6    Period  Weeks    Status  New    Target Date  12/14/17      PT LONG TERM GOAL #3   Title  pt to be able to walk/ standing for >/= 20 min with LRAD with </= 1/10 pain for functional endurance required for community ambulation    Time  6    Period  Weeks    Status  New    Target Date  12/14/17      PT LONG TERM GOAL #4   Title  pt to increase FOTO score to </= 46% limited to demo improvement in function     Time  6    Period  Weeks    Status  New    Target Date  12/14/17            Plan - 11/23/17 1809    Clinical Impression Statement  SLS left 13 seconds.  Hip ROM increased to about 95 AAROM l.  Less stiffness noted post session.  Right knee limits closed chain exercise.    Patient is making gains toward her ROM goal for flexion.  She declined the stairs today dueee to knee pain.    PT Next Visit Plan  BERG balance  PT to explain result of test and write goals. , review and update  HEP, manual therapy PRN, hip strengthening, gait training, stairs,  manual.  Try 6 inch step up.    PT Home Exercise Plan  previous HHPT HEP, sidelying hip abduction, glute set, hip flexor stretching, adductor ball squeeze.     Consulted and Agree with Plan of Care  Patient       Patient will benefit from skilled therapeutic intervention in order to improve the following deficits and impairments:     Visit Diagnosis: Pain in left hip  Stiffness of left hip, not elsewhere classified  Other abnormalities of gait and mobility  Other muscle spasm     Problem List Patient Active Problem List   Diagnosis Date Noted  . History of hip replacement 09/22/2017  . Hyperlipidemia 08/10/2017  . Primary osteoarthritis of left  hip 06/25/2017  . Primary osteoarthritis of right knee 06/25/2017  . Depression 06/25/2017  . Essential hypertension 06/25/2017  . Class 3 severe obesity due to excess calories without serious comorbidity with body mass index (BMI) of 45.0 to 49.9 in adult ([place]) 06/25/2017  . Tobacco abuse 06/25/2017  . Prediabetes 06/25/2017  . OSA on CPAP 06/25/2017    Chizuko Trine PTA 11/23/2017, 6:14 PM  [place], [place], [address] Phone: [phone]   Fax:  [phone]  Name: Sarah Randall MRN: [id number] Date of Birth: 05/24/1966

## 2017-11-25 ENCOUNTER — Ambulatory Visit: Payer: Self-pay | Admitting: Physical Therapy

## 2017-11-26 MED FILL — LOSARTAN POTASSIUM 50 MG TA: 50 | 30 days supply | Qty: 30 | Fill #4

## 2017-11-26 MED FILL — HYDROCHLOROTHIAZIDE 25 MG T: 25 | 30 days supply | Qty: 30 | Fill #3

## 2017-11-26 MED FILL — MELOXICAM 15 MG TABLET: 15 | 30 days supply | Qty: 30 | Fill #2

## 2017-11-26 MED FILL — PRAVASTATIN NA 20 MG TAB: 20 | 30 days supply | Qty: 12 | Fill #3

## 2017-12-01 ENCOUNTER — Ambulatory Visit: Payer: Self-pay | Admitting: Physical Therapy

## 2017-12-01 ENCOUNTER — Encounter: Payer: Self-pay | Admitting: Physical Therapy

## 2017-12-01 DIAGNOSIS — M25552 Pain in left hip: Secondary | ICD-10-CM

## 2017-12-01 DIAGNOSIS — R2689 Other abnormalities of gait and mobility: Secondary | ICD-10-CM

## 2017-12-01 DIAGNOSIS — M62838 Other muscle spasm: Secondary | ICD-10-CM

## 2017-12-01 DIAGNOSIS — M25652 Stiffness of left hip, not elsewhere classified: Secondary | ICD-10-CM

## 2017-12-01 NOTE — Therapy (Addendum)
[place], [place], [address] Phone: [phone]   Fax:  [phone]  Physical Therapy Treatment / Discharge Summary  Patient Details  Name: Sarah Randall MRN: [id number] Date of Birth: [birth year] Referring Provider: Leandrew Koyanagi, MD   Encounter Date: 12/01/2017    Past Medical History:  Diagnosis Date  . Arthritis   . Diabetes mellitus without complication ([place])   . GERD (gastroesophageal reflux disease)    occasionally takes OTC  . Hypertension   . Sleep apnea    tested 2014 - unable to tolerate machine    Past Surgical History:  Procedure Laterality Date  . TOTAL HIP ARTHROPLASTY Left 09/22/2017   Procedure: LEFT TOTAL HIP ARTHROPLASTY ANTERIOR APPROACH;  Surgeon: Leandrew Koyanagi, MD;  Location: [facility];  Service: Orthopedics;  Laterality: Left;  . TUBAL LIGATION      There were no vitals filed for this visit.  Subjective Assessment - 12/01/17 1423    Subjective  Hip is a little stiff.   Knee is still bothering her.     Pain Score  0-No pain    Pain Location  Hip    Pain Orientation  Left;Anterior;Lateral    Pain Descriptors / Indicators  Tightness    Aggravating Factors   activity,   sitting too long,  getting in out of car after several stops,  hip tightens.      Pain Relieving Factors  ice,, heat    Pain Score  9    Pain Location  Knee    Pain Orientation  Right                      [facility] Adult PT Treatment/Exercise - 12/01/17 0001      Self-Care   Self-Care  Lifting;Posture    Lifting  practiced lifting from higher table,   golfer's lift. cues needed for small squat .  also limited by knee with lifting from lower levels.      Posture  sit/  stand practiced,  standing briefly correctly.  right knee pain increases with weightbearing increased.       Lumbar Exercises: Stretches   Hip Flexor Stretch  3 reps;30 seconds concurrent quad stretch with manual strumming       Knee/Hip Exercises: Standing   Forward Step Up  Left;1 set;10 reps;Hand Hold: 2;Step Height: 6"    Functional Squat  5 reps small knee flexion      Knee/Hip Exercises: Seated   Clamshell with [employer]ra[employer]  Green 10 X    Marching  10 reps left hip,  from higher table    Sit to [employer]  10 reps      Knee/Hip Exercises: Supine   Other Supine Knee/Hip Exercises  feet on ball ,, rolling into hip flexion,  10 X  AA with PTA      Modalities   Modalities  Moist Heat      Moist Heat Therapy   Number Minutes Moist Heat  10 Minutes    Moist Heat Location  Knee right      Cryotherapy   Number Minutes Cryotherapy  10 Minutes    Cryotherapy Location  Hip left    Type of Cryotherapy  -- cold pack,  concurrent with moist heat       Manual Therapy   Manual Therapy  Soft tissue mobilization    Soft tissue mobilization  scar,  soft tissue hip ,  tissue softened  Passive ROM  Lt hip Abd,  flexion, ER             PT Education - 12/01/17 1507    Education provided  Yes    Education Details  Lifting.  ADL    Person(s) Educated  Patient    Methods  Verbal cues;Handout       PT Short Term Goals - 12/01/17 1516      PT SHORT TERM GOAL #1   Baseline  independent so far  not consistant with christmas    Time  3    Period  Weeks    Status  Partially Met      PT SHORT TERM GOAL #2   Title  pt to demo / verbalize proper posture and liftin mechanics to prevent/ reduce hip / knee pain     Baseline  able  to demo as much as her knee will allow,  see flow sheet    Time  3    Period  Weeks    Status  Achieved      PT SHORT TERM GOAL #3   Title  perform Berg balance and write goals regarding assessment    Baseline  initial test done last visit    Time  3    Period  Weeks    Status  Partially Met        PT Long Term Goals - 11/02/17 1511      PT LONG TERM GOAL #1   Title  improve L hip flexion to >/= 90 degrees and extension to >/= 5 degrees to promote functional hip mobility required  for functional gait pattern and navigating steps    Time  6    Period  Weeks    Status  New    Target Date  12/14/17      PT LONG TERM GOAL #2   Title  pt to increase L hip strength to >/= 4+/5 in all planes to promote hip stability with walking/ standing activites    Time  6    Period  Weeks    Status  New    Target Date  12/14/17      PT LONG TERM GOAL #3   Title  pt to be able to walk/ standing for >/= 20 min with LRAD with </= 1/10 pain for functional endurance required for community ambulation    Time  6    Period  Weeks    Status  New    Target Date  12/14/17      PT LONG TERM GOAL #4   Title  pt to increase FOTO score to </= 46% limited to demo improvement in function     Time  6    Period  Weeks    Status  New    Target Date  12/14/17              Patient will benefit from skilled therapeutic intervention in order to improve the following deficits and impairments:     Visit Diagnosis: Pain in left hip  Stiffness of left hip, not elsewhere classified  Other abnormalities of gait and mobility  Other muscle spasm     Problem List Patient Active Problem List   Diagnosis Date Noted  . History of hip replacement 09/22/2017  . Hyperlipidemia 08/10/2017  . Primary osteoarthritis of left hip 06/25/2017  . Primary osteoarthritis of right knee 06/25/2017  . Depression 06/25/2017  . Essential hypertension 06/25/2017  . Class  3 severe obesity due to excess calories without serious comorbidity with body mass index (BMI) of 45.0 to 49.9 in adult ([place]) 06/25/2017  . Tobacco abuse 06/25/2017  . Prediabetes 06/25/2017  . OSA on CPAP 06/25/2017    Sarah Randall,Sarah Randall PTA 12/01/2017, 3:20 PM  [place], [place], [address] Phone: [phone]   Fax:  [phone]  Name: Sarah Randall MRN: [id number] Date of Birth: 1966-10-22         PHYSICAL THERAPY DISCHARGE SUMMARY  Visits from  Start of Care: 7  Current functional level related to goals / functional outcomes: See goals   Remaining deficits: unknown   Education / Equipment: HEP  Plan: Patient agrees to discharge.  Patient goals were not met. Patient is being discharged due to not returning since the last visit.  ?????

## 2017-12-01 NOTE — Patient Instructions (Signed)

## 2017-12-03 ENCOUNTER — Ambulatory Visit: Payer: Self-pay | Admitting: Physical Therapy

## 2017-12-20 ENCOUNTER — Encounter (INDEPENDENT_AMBULATORY_CARE_PROVIDER_SITE_OTHER): Payer: Self-pay | Admitting: Orthopaedic Surgery

## 2017-12-20 ENCOUNTER — Ambulatory Visit (INDEPENDENT_AMBULATORY_CARE_PROVIDER_SITE_OTHER): Payer: Self-pay

## 2017-12-20 ENCOUNTER — Ambulatory Visit (INDEPENDENT_AMBULATORY_CARE_PROVIDER_SITE_OTHER): Payer: Self-pay | Admitting: Orthopaedic Surgery

## 2017-12-20 DIAGNOSIS — Z96642 Presence of left artificial hip joint: Secondary | ICD-10-CM

## 2017-12-20 DIAGNOSIS — M1711 Unilateral primary osteoarthritis, right knee: Secondary | ICD-10-CM

## 2017-12-20 DIAGNOSIS — M25561 Pain in right knee: Secondary | ICD-10-CM

## 2017-12-20 DIAGNOSIS — G8929 Other chronic pain: Secondary | ICD-10-CM

## 2017-12-20 NOTE — Progress Notes (Signed)
Office Visit Note   Patient: Sarah Randall           Date of Birth: [birth year]           MRN: [id number] Visit Date: 12/20/2017              Requested by: Ladell Pier, MD [address] [place], [place] [address] PCP: Ladell Pier, MD   Assessment & Plan: Visit Diagnoses:  1. Primary osteoarthritis of right knee     Plan: Impression is 3 months status post left total knee replacement doing well and right knee advanced degenerative joint disease.  Patient has failed conservative treatment and wishes to proceed with total knee replacement.  She understands risk benefits alternatives of surgery and wishes to proceed.  She does have an increased BMI which she does understand puts her at some increased risk of complications. Total face to face encounter time was greater than 25 minutes and over half of this time was spent in counseling and/or coordination of care.  Follow-Up Instructions: Return if symptoms worsen or fail to improve.   Orders:  Orders Placed This Encounter  Procedures  . XR KNEE 3 VIEW RIGHT   No orders of the defined types were placed in this encounter.     Procedures: No procedures performed   Clinical Data: No additional findings.   Subjective: Chief Complaint  Patient presents with  . Right Knee - Follow-up  . Left Hip - Routine Post Op    Patient comes in today for follow-up of her left total hip replacement which is just under 3 months.  She is also complaining of her right knee degenerative joint disease which we have been following her for.  She is hoping to proceed with total knee replacement in the near future.  She did well from the hip surgery.  Her right knee is now significantly bothering her and her ADLs.      Review of Systems  Constitutional: Negative.   HENT: Negative.   Eyes: Negative.   Respiratory: Negative.   Cardiovascular: Negative.   Endocrine: Negative.   Musculoskeletal: Negative.   Neurological:  Negative.   Hematological: Negative.   Psychiatric/Behavioral: Negative.   All other systems reviewed and are negative.    Objective: Vital Signs: There were no vitals taken for this visit.  Physical Exam  Constitutional: She is oriented to person, place, and time. She appears well-developed and well-nourished.  Pulmonary/Chest: Effort normal.  Neurological: She is alert and oriented to person, place, and time.  Skin: Skin is warm. Capillary refill takes less than 2 seconds.  Psychiatric: She has a normal mood and affect. Her behavior is normal. Judgment and thought content normal.  Nursing note and vitals reviewed.   Ortho Exam Right knee exam shows no joint effusion.  Varus alignment.  Joint motion is well-preserved. Specialty Comments:  No specialty comments available.  Imaging: Xr Knee 3 View Right  Result Date: 12/20/2017 Advanced DJD with varus alignment    PMFS History: Patient Active Problem List   Diagnosis Date Noted  . History of hip replacement 09/22/2017  . Hyperlipidemia 08/10/2017  . Primary osteoarthritis of left hip 06/25/2017  . Primary osteoarthritis of right knee 06/25/2017  . Depression 06/25/2017  . Essential hypertension 06/25/2017  . Class 3 severe obesity due to excess calories without serious comorbidity with body mass index (BMI) of 45.0 to 49.9 in adult ([place]) 06/25/2017  . Tobacco abuse 06/25/2017  . Prediabetes 06/25/2017  .  OSA on CPAP 06/25/2017   Past Medical History:  Diagnosis Date  . Arthritis   . Diabetes mellitus without complication ([place])   . GERD (gastroesophageal reflux disease)    occasionally takes OTC  . Hypertension   . Sleep apnea    tested 2014 - unable to tolerate machine    Family History  Problem Relation Age of Onset  . Hypertension Mother   . Diabetes Mother   . Hypertension Father   . Breast cancer Maternal Aunt   . Lung cancer Maternal Aunt     Past Surgical History:  Procedure Laterality Date  .  TOTAL HIP ARTHROPLASTY Left 09/22/2017   Procedure: LEFT TOTAL HIP ARTHROPLASTY ANTERIOR APPROACH;  Surgeon: Leandrew Koyanagi, MD;  Location: [facility];  Service: Orthopedics;  Laterality: Left;  . TUBAL LIGATION     Social History   Occupational History  . Occupation: unemployed  Tobacco Use  . Smoking status: Current Some Day Smoker    Packs/day: 0.25    Years: 30.00    Pack years: 7.50    Types: Cigarettes  . Smokeless tobacco: Never Used  Substance and Sexual Activity  . Alcohol use: Yes    Comment: occasional  . Drug use: No    Comment: smoke 1 joint cannibis 1 week ago.  Marland Kitchen Sexual activity: Yes    Birth control/protection: None

## 2017-12-24 ENCOUNTER — Telehealth: Payer: Self-pay | Admitting: Internal Medicine

## 2017-12-24 NOTE — Telephone Encounter (Signed)
Patient came in the office to ask for tramadol. The Patient stated that the other pain medication she is no longer on it was for her surgery she had.  Please fu with the patient.

## 2017-12-31 ENCOUNTER — Ambulatory Visit: Payer: Self-pay | Admitting: Internal Medicine

## 2017-12-31 MED FILL — LOSARTAN POTASSIUM 50 MG TA: 50 | 30 days supply | Qty: 30 | Fill #5

## 2017-12-31 MED FILL — PRAVASTATIN NA 20 MG TAB: 20 | 30 days supply | Qty: 12 | Fill #4

## 2017-12-31 MED FILL — HYDROCHLOROTHIAZIDE 25 MG T: 25 | 30 days supply | Qty: 30 | Fill #4

## 2018-01-05 ENCOUNTER — Encounter (HOSPITAL_COMMUNITY)
Admission: RE | Admit: 2018-01-05 | Discharge: 2018-01-05 | Disposition: A | Discharge status: Home / Self Care | Payer: Self-pay | Source: Ambulatory Visit | Attending: Orthopaedic Surgery | Admitting: Orthopaedic Surgery

## 2018-01-05 ENCOUNTER — Other Ambulatory Visit: Payer: Self-pay

## 2018-01-05 ENCOUNTER — Encounter (HOSPITAL_COMMUNITY): Payer: Self-pay

## 2018-01-05 DIAGNOSIS — M1711 Unilateral primary osteoarthritis, right knee: Secondary | ICD-10-CM | POA: Insufficient documentation

## 2018-01-05 DIAGNOSIS — Z0183 Encounter for blood typing: Secondary | ICD-10-CM | POA: Insufficient documentation

## 2018-01-05 DIAGNOSIS — Z01818 Encounter for other preprocedural examination: Secondary | ICD-10-CM | POA: Insufficient documentation

## 2018-01-05 DIAGNOSIS — Z01812 Encounter for preprocedural laboratory examination: Secondary | ICD-10-CM | POA: Insufficient documentation

## 2018-01-05 HISTORY — DX: Depression, unspecified: F32.A

## 2018-01-05 HISTORY — DX: Major depressive disorder, single episode, unspecified: F32.9

## 2018-01-05 HISTORY — DX: Prediabetes: R73.03

## 2018-01-05 LAB — CBC WITH DIFFERENTIAL/PLATELET
Basophils Absolute: 0.1 10*3/uL (ref 0.0–0.1)
Basophils Relative: 1 %
Eosinophils Absolute: 0.2 10*3/uL (ref 0.0–0.7)
Eosinophils Relative: 2 %
HCT: 46.8 % — ABNORMAL HIGH (ref 36.0–46.0)
Hemoglobin: 15.8 g/dL — ABNORMAL HIGH (ref 12.0–15.0)
Lymphocytes Relative: 40 %
Lymphs Abs: 4.7 10*3/uL — ABNORMAL HIGH (ref 0.7–4.0)
MCH: 31.1 pg (ref 26.0–34.0)
MCHC: 33.8 g/dL (ref 30.0–36.0)
MCV: 92.1 fL (ref 78.0–100.0)
Monocytes Absolute: 0.7 10*3/uL (ref 0.1–1.0)
Monocytes Relative: 6 %
Neutro Abs: 6 10*3/uL (ref 1.7–7.7)
Neutrophils Relative %: 51 %
Platelets: 287 10*3/uL (ref 150–400)
RBC: 5.08 MIL/uL (ref 3.87–5.11)
RDW: 13.5 % (ref 11.5–15.5)
Smear Review: ADEQUATE
WBC: 11.7 10*3/uL — ABNORMAL HIGH (ref 4.0–10.5)

## 2018-01-05 LAB — PROTIME-INR
INR: 0.97
Prothrombin Time: 12.8 seconds (ref 11.4–15.2)

## 2018-01-05 LAB — COMPREHENSIVE METABOLIC PANEL
ALT: 14 U/L (ref 14–54)
AST: 16 U/L (ref 15–41)
Albumin: 4.2 g/dL (ref 3.5–5.0)
Alkaline Phosphatase: 73 U/L (ref 38–126)
Anion gap: 13 (ref 5–15)
BUN: 9 mg/dL (ref 6–20)
CO2: 20 mmol/L — ABNORMAL LOW (ref 22–32)
Calcium: 9.2 mg/dL (ref 8.9–10.3)
Chloride: 102 mmol/L (ref 101–111)
Creatinine, Ser: 0.85 mg/dL (ref 0.44–1.00)
GFR calc Af Amer: >60 mL/min (ref >60)
GFR calc non Af Amer: >60 mL/min (ref >60)
Glucose, Bld: 89 mg/dL (ref 65–99)
Potassium: 3.3 mmol/L — ABNORMAL LOW (ref 3.5–5.1)
Sodium: 135 mmol/L (ref 135–145)
Total Bilirubin: 0.7 mg/dL (ref 0.3–1.2)
Total Protein: 7.7 g/dL (ref 6.5–8.1)

## 2018-01-05 LAB — HEMOGLOBIN A1C
Hgb A1c MFr Bld: 5.9 % — ABNORMAL HIGH (ref 4.8–5.6)
Mean Plasma Glucose: 122.63 mg/dL

## 2018-01-05 LAB — SURGICAL PCR SCREEN
MRSA, PCR: NEGATIVE
Staphylococcus aureus: NEGATIVE

## 2018-01-05 LAB — TYPE AND SCREEN
ABO/RH(D): O POS
Antibody Screen: NEGATIVE

## 2018-01-05 LAB — APTT: aPTT: 27 seconds (ref 24–36)

## 2018-01-05 NOTE — Progress Notes (Addendum)
PCP is Dr. Karle Plumber  Denies seeing a cardiologist. Denies chest pain, cough, or fever. Denies ever having a card cath, or echo. Stress test noted 09-24-16

## 2018-01-05 NOTE — Pre-Procedure Instructions (Signed)
[facility]  01/05/2018      [place], [address] [place] [address] Phone: [phone] Fax: [phone]    Your procedure is scheduled on 01/12/2018.  Report to [facility] Admitting at 1045 A.M.  Call this number if you have problems the morning of surgery:  [phone]   Remember:  Do not eat food or drink liquids after midnight.   Continue all medications as directed by your physician except follow these medication instructions before surgery below   Take these medicines the morning of surgery with A SIP OF WATER: Bupropion (Wellbutrin)   7 days prior to surgery STOP taking any Meloxicam (Mobic), Aspirin (unless otherwise instructed by your surgeon), Aleve, Naproxen, Ibuprofen, Motrin, Advil, Goody's, BC's, all herbal medications, fish oil, and all vitamins  Follow your doctors instructions regarding your Aspirin.  If no instructions were given by your doctor, then you will need to call the prescribing office office to get instructions.    WHAT DO I DO ABOUT MY DIABETES MEDICATION?  Marland Kitchen Do not take oral diabetes medicines (pills) the morning of surgery.  . THE NIGHT BEFORE SURGERY, take ___________ units of ___________insulin.     . THE MORNING OF SURGERY, take _____________ units of __________insulin.  . The day of surgery, do not take other diabetes injectables, including Byetta (exenatide), Bydureon (exenatide ER), Victoza (liraglutide), or Trulicity (dulaglutide).  . If your CBG is greater than 220 mg/dL, you may take  of your sliding scale (correction) dose of insulin.   How to Manage Your Diabetes Before and After Surgery  Why is it important to control my blood sugar before and after surgery? . Improving blood sugar levels before and after surgery helps healing and can limit problems. . A way of improving blood sugar control is eating a healthy diet by: o  Eating less sugar and  carbohydrates o  Increasing activity/exercise o  Talking with your doctor about reaching your blood sugar goals . High blood sugars (greater than 180 mg/dL) can raise your risk of infections and slow your recovery, so you will need to focus on controlling your diabetes during the weeks before surgery. . Make sure that the doctor who takes care of your diabetes knows about your planned surgery including the date and location.  How do I manage my blood sugar before surgery? . Check your blood sugar at least 4 times a day, starting 2 days before surgery, to make sure that the level is not too high or low. o Check your blood sugar the morning of your surgery when you wake up and every 2 hours until you get to the Short Stay unit. . If your blood sugar is less than 70 mg/dL, you will need to treat for low blood sugar: o Do not take insulin. o Treat a low blood sugar (less than 70 mg/dL) with  cup of clear juice (cranberry or apple), 4 glucose tablets, OR glucose gel. Recheck blood sugar in 15 minutes after treatment (to make sure it is greater than 70 mg/dL). If your blood sugar is not greater than 70 mg/dL on recheck, call [phone] o  for further instructions. . Report your blood sugar to the short stay nurse when you get to Short Stay.  . If you are admitted to the hospital after surgery: o Your blood sugar will be checked by the staff and you will probably be given insulin  after surgery (instead of oral diabetes medicines) to make sure you have good blood sugar levels. o The goal for blood sugar control after surgery is 80-180 mg/dL.      Do not wear jewelry, make-up or nail polish.  Do not wear lotions, powders, or perfumes, or deodorant.  Do not shave 48 hours prior to surgery.  Men may shave face and neck.  Do not bring valuables to the hospital.  [facility] is not responsible for any belongings or valuables.  Hearing aids, eyeglasses, contacts, dentures or bridgework may not be  worn into surgery.  Leave your suitcase in the car.  After surgery it may be brought to your room.  For patients admitted to the hospital, discharge time will be determined by your treatment team.  Patients discharged the day of surgery will not be allowed to drive home.   Name and phone number of your driver:    Special instructions:   [facility]- Preparing For Surgery  Before surgery, you can play an important role. Because skin is not sterile, your skin needs to be as free of germs as possible. You can reduce the number of germs on your skin by washing with CHG (chlorahexidine gluconate) Soap before surgery.  CHG is an antiseptic cleaner which kills germs and bonds with the skin to continue killing germs even after washing.  Please do not use if you have an allergy to CHG or antibacterial soaps. If your skin becomes reddened/irritated stop using the CHG.  Do not shave (including legs and underarms) for at least 48 hours prior to first CHG shower. It is OK to shave your face.  Please follow these instructions carefully.   1. Shower the NIGHT BEFORE SURGERY and the MORNING OF SURGERY with CHG.   2. If you chose to wash your hair, wash your hair first as usual with your normal shampoo.  3. After you shampoo, rinse your hair and body thoroughly to remove the shampoo.  4. Use CHG as you would any other liquid soap. You can apply CHG directly to the skin and wash gently with a scrungie or a clean washcloth.   5. Apply the CHG Soap to your body ONLY FROM THE NECK DOWN.  Do not use on open wounds or open sores. Avoid contact with your eyes, ears, mouth and genitals (private parts). Wash Face and genitals (private parts)  with your normal soap.  6. Wash thoroughly, paying special attention to the area where your surgery will be performed.  7. Thoroughly rinse your body with warm water from the neck down.  8. DO NOT shower/wash with your normal soap after using and rinsing off the CHG  Soap.  9. Pat yourself dry with a CLEAN TOWEL.  10. Wear CLEAN PAJAMAS to bed the night before surgery, wear comfortable clothes the morning of surgery  11. Place CLEAN SHEETS on your bed the night of your first shower and DO NOT SLEEP WITH PETS.    Day of Surgery: Shower as stated above. Do not apply any deodorants/lotions.  Please wear clean clothes to the hospital/surgery center.      Please read over the following fact sheets that you were given.

## 2018-01-11 ENCOUNTER — Other Ambulatory Visit (INDEPENDENT_AMBULATORY_CARE_PROVIDER_SITE_OTHER): Payer: Self-pay

## 2018-01-11 MED ORDER — DEXTROSE 5 % IV SOLN
3.0000 g | INTRAVENOUS | Status: AC
  Administered 2018-01-12: 3 g via INTRAVENOUS
  Filled 2018-01-11: qty 3

## 2018-01-11 MED ORDER — CEFAZOLIN SODIUM-DEXTROSE 2-4 GM/100ML-% IV SOLN: 2.0000 g | INTRAVENOUS | Status: DC

## 2018-01-11 MED ORDER — SODIUM CHLORIDE 0.9 % IV SOLN
2000.0000 mg | INTRAVENOUS | Status: AC
  Administered 2018-01-12: 2000 mg via TOPICAL
  Filled 2018-01-11: qty 20

## 2018-01-11 MED ORDER — TRANEXAMIC ACID 1000 MG/10ML IV SOLN
2000.0000 mg | INTRAVENOUS | Status: DC
  Filled 2018-01-11: qty 20

## 2018-01-12 ENCOUNTER — Encounter (HOSPITAL_COMMUNITY): Payer: Self-pay | Admitting: *Deleted

## 2018-01-12 ENCOUNTER — Inpatient Hospital Stay (HOSPITAL_COMMUNITY): Payer: Medicaid Other | Admitting: Anesthesiology

## 2018-01-12 ENCOUNTER — Inpatient Hospital Stay (HOSPITAL_COMMUNITY)
Admission: RE | Admit: 2018-01-12 | Discharge: 2018-01-14 | DRG: 470 | Disposition: A | Discharge status: Home Health | Payer: Medicaid Other | Source: Ambulatory Visit | Attending: Orthopaedic Surgery | Admitting: Orthopaedic Surgery

## 2018-01-12 ENCOUNTER — Other Ambulatory Visit: Payer: Self-pay

## 2018-01-12 ENCOUNTER — Encounter (HOSPITAL_COMMUNITY)
Admission: RE | Disposition: A | Discharge status: Home Health | Payer: Self-pay | Source: Ambulatory Visit | Attending: Orthopaedic Surgery

## 2018-01-12 ENCOUNTER — Inpatient Hospital Stay (HOSPITAL_COMMUNITY): Payer: Medicaid Other

## 2018-01-12 DIAGNOSIS — Z79899 Other long term (current) drug therapy: Secondary | ICD-10-CM | POA: Diagnosis not present

## 2018-01-12 DIAGNOSIS — Z79891 Long term (current) use of opiate analgesic: Secondary | ICD-10-CM | POA: Diagnosis not present

## 2018-01-12 DIAGNOSIS — F329 Major depressive disorder, single episode, unspecified: Secondary | ICD-10-CM | POA: Diagnosis present

## 2018-01-12 DIAGNOSIS — F17210 Nicotine dependence, cigarettes, uncomplicated: Secondary | ICD-10-CM | POA: Diagnosis not present

## 2018-01-12 DIAGNOSIS — M25761 Osteophyte, right knee: Secondary | ICD-10-CM | POA: Diagnosis not present

## 2018-01-12 DIAGNOSIS — Z6841 Body Mass Index (BMI) 40.0 and over, adult: Secondary | ICD-10-CM | POA: Diagnosis not present

## 2018-01-12 DIAGNOSIS — K219 Gastro-esophageal reflux disease without esophagitis: Secondary | ICD-10-CM | POA: Diagnosis present

## 2018-01-12 DIAGNOSIS — Z23 Encounter for immunization: Secondary | ICD-10-CM | POA: Diagnosis not present

## 2018-01-12 DIAGNOSIS — Z791 Long term (current) use of non-steroidal anti-inflammatories (NSAID): Secondary | ICD-10-CM

## 2018-01-12 DIAGNOSIS — I10 Essential (primary) hypertension: Secondary | ICD-10-CM | POA: Diagnosis not present

## 2018-01-12 DIAGNOSIS — Z833 Family history of diabetes mellitus: Secondary | ICD-10-CM | POA: Diagnosis not present

## 2018-01-12 DIAGNOSIS — Z96651 Presence of right artificial knee joint: Secondary | ICD-10-CM

## 2018-01-12 DIAGNOSIS — R7303 Prediabetes: Secondary | ICD-10-CM | POA: Diagnosis not present

## 2018-01-12 DIAGNOSIS — Z888 Allergy status to other drugs, medicaments and biological substances status: Secondary | ICD-10-CM

## 2018-01-12 DIAGNOSIS — Z7982 Long term (current) use of aspirin: Secondary | ICD-10-CM

## 2018-01-12 DIAGNOSIS — Z96659 Presence of unspecified artificial knee joint: Secondary | ICD-10-CM

## 2018-01-12 DIAGNOSIS — M1711 Unilateral primary osteoarthritis, right knee: Principal | ICD-10-CM | POA: Diagnosis present

## 2018-01-12 DIAGNOSIS — Z8249 Family history of ischemic heart disease and other diseases of the circulatory system: Secondary | ICD-10-CM | POA: Diagnosis not present

## 2018-01-12 HISTORY — PX: TOTAL KNEE ARTHROPLASTY: SHX125

## 2018-01-12 LAB — GLUCOSE, CAPILLARY
Glucose-Capillary: 119 mg/dL — ABNORMAL HIGH (ref 65–99)
Glucose-Capillary: 141 mg/dL — ABNORMAL HIGH (ref 65–99)

## 2018-01-12 LAB — POCT PREGNANCY, URINE: Preg Test, Ur: NEGATIVE

## 2018-01-12 IMAGING — DX DG KNEE 1-2V PORT*R*
2 series · 2 of 2 positions shown · non-contrast
Comparison: Two-view the radiographs [DATE]

CLINICAL DATA: Right knee replacement.

EXAM:
PORTABLE RIGHT KNEE - 1-2 VIEW

[knee ap]
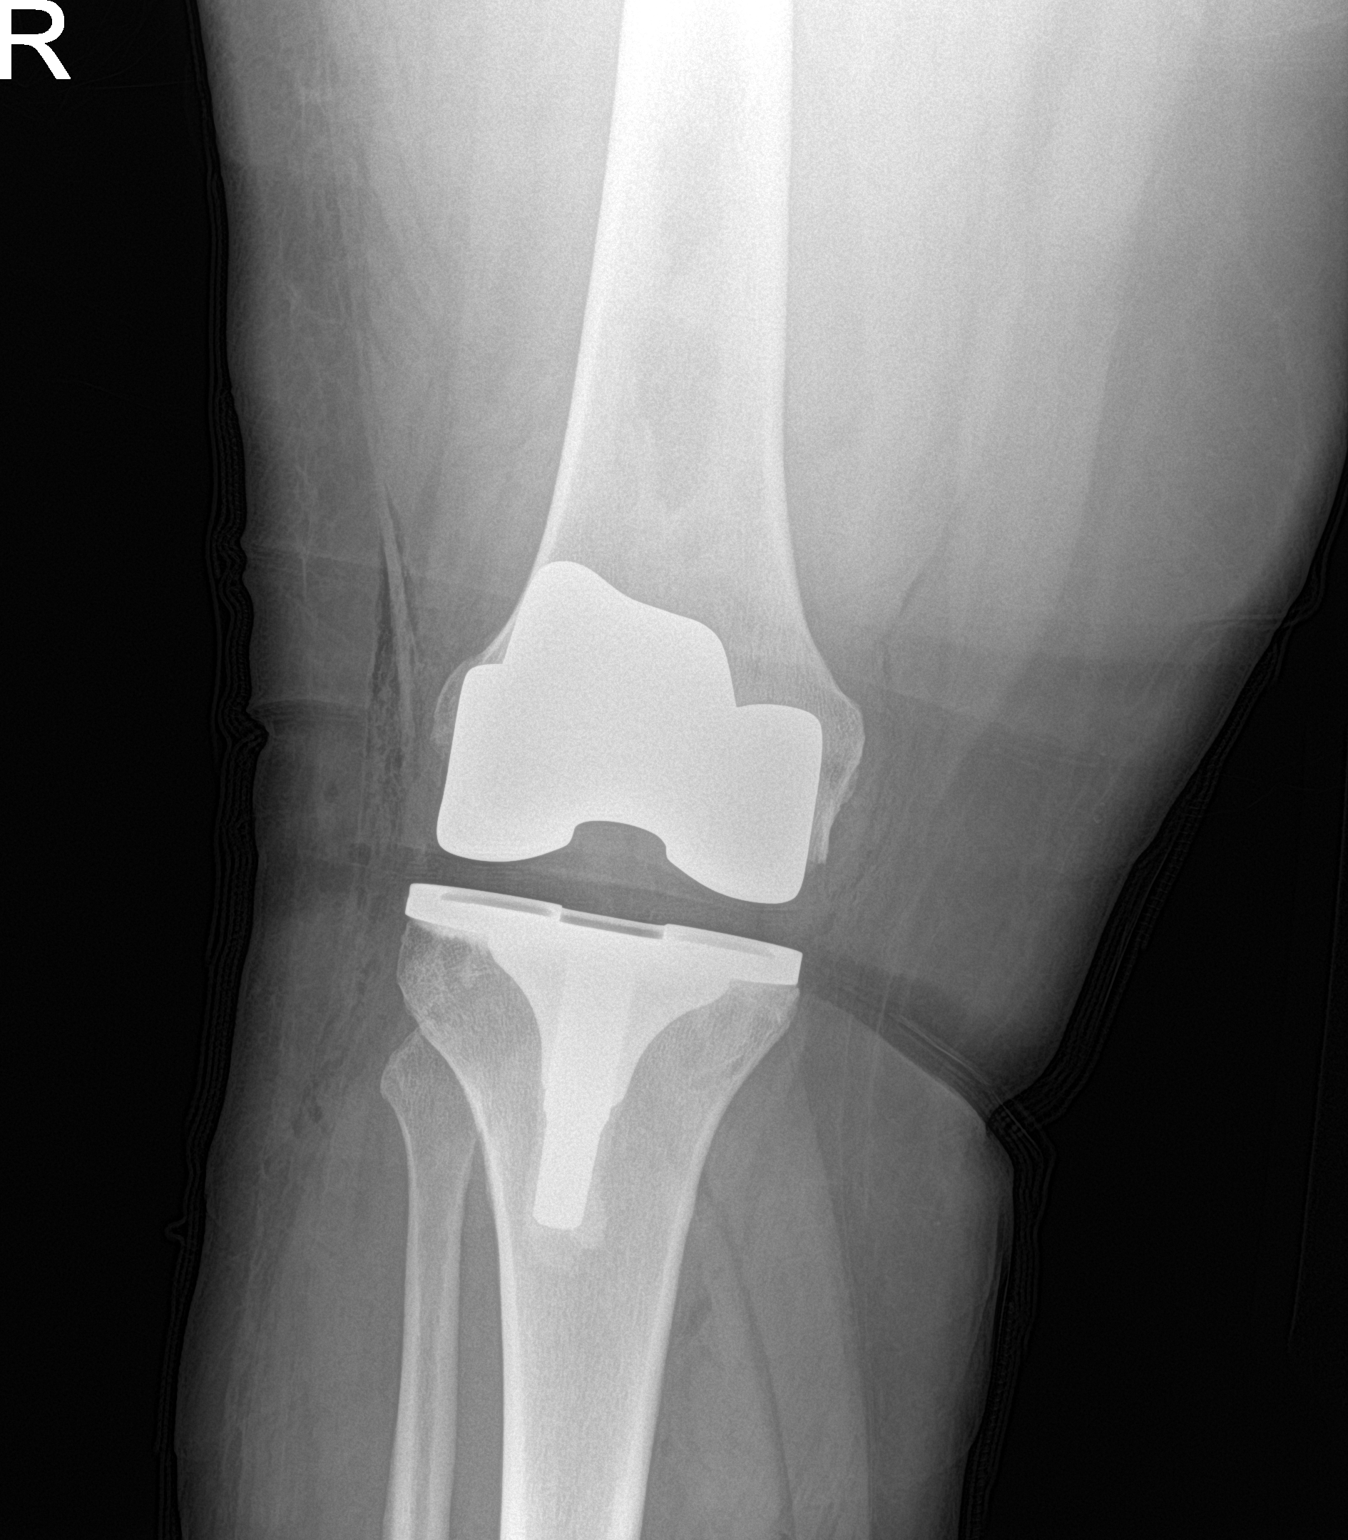

[knee lat]
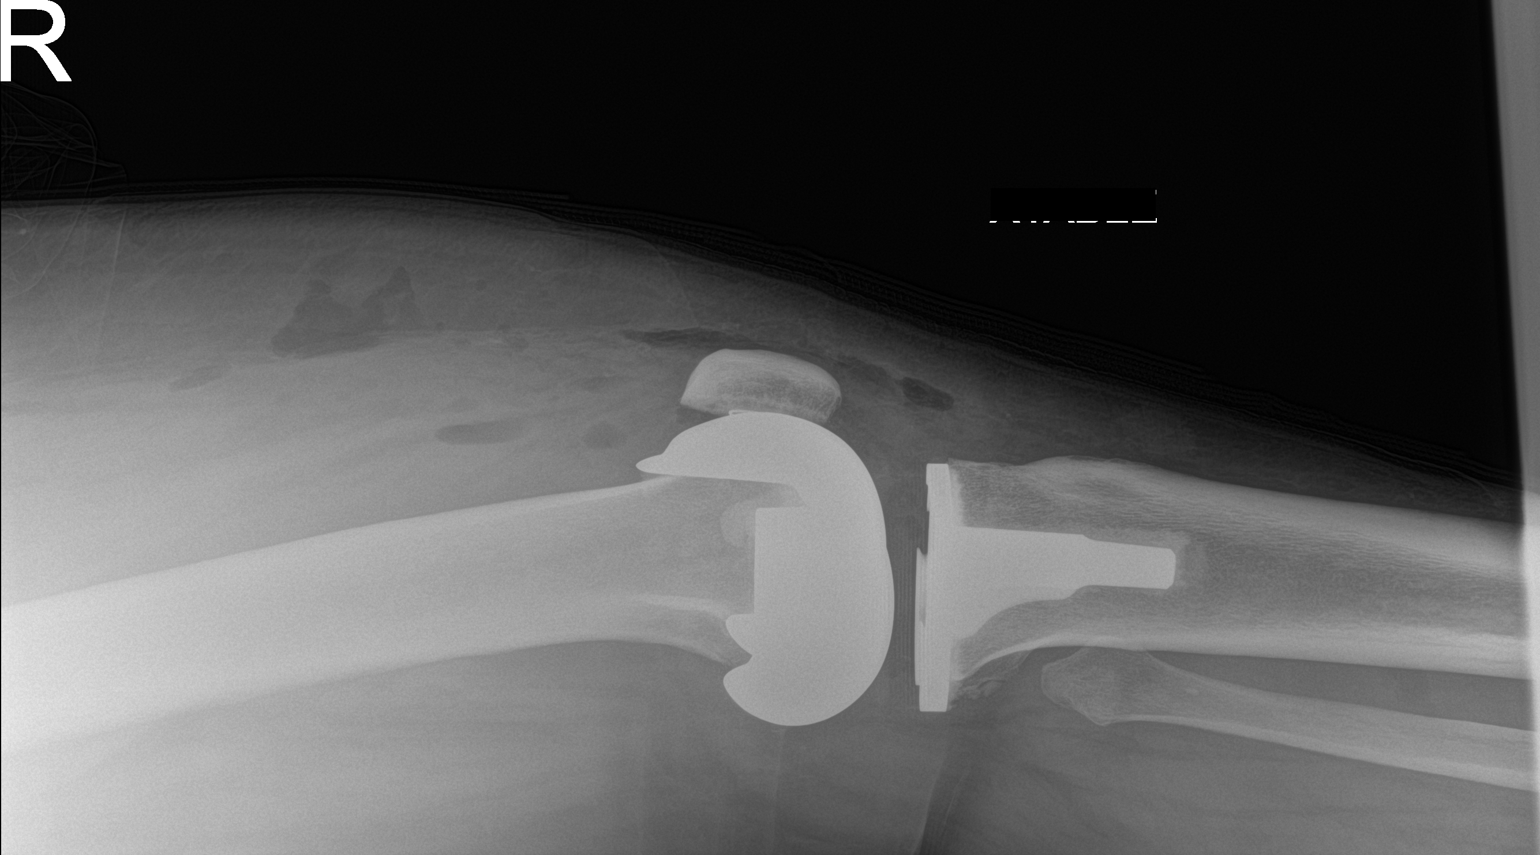

[2 of 2 positions shown; findings below may reference images not displayed]

FINDINGS: Right total knee arthroplasty has been performed. Femoral and tibial
components are well seated. The knee is located. Disc spaces
maintained. Fluid and gas are present in the joint, as expected.
IMPRESSION: Status post right total knee arthroplasty without radiographic
evidence for complication.

## 2018-01-12 SURGERY — ARTHROPLASTY, KNEE, TOTAL
Anesthesia: General | Site: Knee | Laterality: Right

## 2018-01-12 MED ORDER — ACETAMINOPHEN 325 MG PO TABS: 650.0000 mg | ORAL_TABLET | ORAL | Status: DC | PRN

## 2018-01-12 MED ORDER — SODIUM CHLORIDE 0.9% FLUSH
INTRAVENOUS | Status: DC | PRN
  Administered 2018-01-12: 40 mL via INTRAVENOUS

## 2018-01-12 MED ORDER — MORPHINE SULFATE (PF) 2 MG/ML IV SOLN: 1.0000 mg | INTRAVENOUS | Status: DC | PRN

## 2018-01-12 MED ORDER — PROPOFOL 10 MG/ML IV BOLUS
INTRAVENOUS | Status: DC | PRN
  Administered 2018-01-12: 200 mg via INTRAVENOUS

## 2018-01-12 MED ORDER — ONDANSETRON HCL 4 MG/2ML IJ SOLN
INTRAMUSCULAR | Status: DC | PRN
  Administered 2018-01-12: 4 mg via INTRAVENOUS

## 2018-01-12 MED ORDER — METOCLOPRAMIDE HCL 5 MG PO TABS: 5.0000 mg | ORAL_TABLET | Freq: Three times a day (TID) | ORAL | Status: DC | PRN

## 2018-01-12 MED ORDER — PNEUMOCOCCAL VAC POLYVALENT 25 MCG/0.5ML IJ INJ
0.5000 mL | INJECTION | INTRAMUSCULAR | Status: AC
  Administered 2018-01-13: 0.5 mL via INTRAMUSCULAR
  Filled 2018-01-12: qty 0.5

## 2018-01-12 MED ORDER — POLYETHYLENE GLYCOL 3350 17 G PO PACK
17.0000 g | PACK | Freq: Every day | ORAL | Status: DC | PRN
Start: 2018-01-12 — End: 2018-01-14
  Administered 2018-01-13: 17 g via ORAL
  Filled 2018-01-12: qty 1

## 2018-01-12 MED ORDER — CHLORHEXIDINE GLUCONATE 4 % EX LIQD: 60.0000 mL | Freq: Once | CUTANEOUS | Status: DC

## 2018-01-12 MED ORDER — OXYCODONE HCL 5 MG PO TABS
5.0000 mg | ORAL_TABLET | ORAL | Status: DC | PRN
  Administered 2018-01-13 (×2): 5 mg via ORAL
  Filled 2018-01-12 (×2): qty 1

## 2018-01-12 MED ORDER — HYDROMORPHONE HCL 1 MG/ML IJ SOLN
0.2500 mg | INTRAMUSCULAR | Status: DC | PRN
  Administered 2018-01-12 (×3): 0.5 mg via INTRAVENOUS

## 2018-01-12 MED ORDER — SODIUM CHLORIDE 0.9 % IV SOLN
INTRAVENOUS | Status: DC
  Administered 2018-01-12 (×2): via INTRAVENOUS

## 2018-01-12 MED ORDER — ASPIRIN EC 325 MG PO TBEC: 325.0000 mg | DELAYED_RELEASE_TABLET | Freq: Two times a day (BID) | ORAL | 0 refills | Status: DC

## 2018-01-12 MED ORDER — DEXAMETHASONE SODIUM PHOSPHATE 10 MG/ML IJ SOLN
10.0000 mg | Freq: Once | INTRAMUSCULAR | Status: AC
  Administered 2018-01-13: 10 mg via INTRAVENOUS
  Filled 2018-01-12: qty 1

## 2018-01-12 MED ORDER — METOCLOPRAMIDE HCL 5 MG/ML IJ SOLN
5.0000 mg | Freq: Three times a day (TID) | INTRAMUSCULAR | Status: DC | PRN
Start: 2018-01-12 — End: 2018-01-14

## 2018-01-12 MED ORDER — FENTANYL CITRATE (PF) 250 MCG/5ML IJ SOLN
INTRAMUSCULAR | Status: DC | PRN
  Administered 2018-01-12: 150 ug via INTRAVENOUS
  Administered 2018-01-12: 100 ug via INTRAVENOUS
  Administered 2018-01-12 (×3): 50 ug via INTRAVENOUS

## 2018-01-12 MED ORDER — PROMETHAZINE HCL 25 MG PO TABS: 25.0000 mg | ORAL_TABLET | Freq: Four times a day (QID) | ORAL | 1 refills | Status: DC | PRN

## 2018-01-12 MED ORDER — OXYCODONE HCL 5 MG/5ML PO SOLN: 5.0000 mg | Freq: Once | ORAL | Status: AC | PRN

## 2018-01-12 MED ORDER — TRANEXAMIC ACID 1000 MG/10ML IV SOLN
1000.0000 mg | INTRAVENOUS | Status: AC
  Administered 2018-01-12: 1000 mg via INTRAVENOUS
  Filled 2018-01-12: qty 1100

## 2018-01-12 MED ORDER — SENNOSIDES-DOCUSATE SODIUM 8.6-50 MG PO TABS: 1.0000 | ORAL_TABLET | Freq: Every evening | ORAL | 1 refills | Status: DC | PRN

## 2018-01-12 MED ORDER — SORBITOL 70 % SOLN
30.0000 mL | Freq: Every day | Status: DC | PRN
Start: 2018-01-12 — End: 2018-01-14

## 2018-01-12 MED ORDER — OXYCODONE HCL 5 MG PO TABS: 5.0000 mg | ORAL_TABLET | ORAL | 0 refills | Status: DC | PRN

## 2018-01-12 MED ORDER — ACETAMINOPHEN 650 MG RE SUPP: 650.0000 mg | RECTAL | Status: DC | PRN

## 2018-01-12 MED ORDER — MENTHOL 3 MG MT LOZG: 1.0000 | LOZENGE | OROMUCOSAL | Status: DC | PRN

## 2018-01-12 MED ORDER — VANCOMYCIN HCL 1000 MG IV SOLR
INTRAVENOUS | Status: AC
  Filled 2018-01-12: qty 1000

## 2018-01-12 MED ORDER — PHENOL 1.4 % MT LIQD: 1.0000 | OROMUCOSAL | Status: DC | PRN

## 2018-01-12 MED ORDER — HYDROMORPHONE HCL 1 MG/ML IJ SOLN
INTRAMUSCULAR | Status: AC
  Filled 2018-01-12: qty 1

## 2018-01-12 MED ORDER — ONDANSETRON HCL 4 MG PO TABS
4.0000 mg | ORAL_TABLET | Freq: Four times a day (QID) | ORAL | Status: DC | PRN
Start: 2018-01-12 — End: 2018-01-14

## 2018-01-12 MED ORDER — MIDAZOLAM HCL 5 MG/5ML IJ SOLN
INTRAMUSCULAR | Status: DC | PRN
  Administered 2018-01-12: 2 mg via INTRAVENOUS

## 2018-01-12 MED ORDER — FENTANYL CITRATE (PF) 250 MCG/5ML IJ SOLN
INTRAMUSCULAR | Status: AC
  Filled 2018-01-12: qty 5

## 2018-01-12 MED ORDER — ASPIRIN EC 325 MG PO TBEC
325.0000 mg | DELAYED_RELEASE_TABLET | Freq: Two times a day (BID) | ORAL | Status: DC
  Administered 2018-01-13 – 2018-01-14 (×2): 325 mg via ORAL
  Filled 2018-01-12 (×2): qty 1

## 2018-01-12 MED ORDER — ONDANSETRON HCL 4 MG/2ML IJ SOLN
4.0000 mg | Freq: Four times a day (QID) | INTRAMUSCULAR | Status: DC | PRN
  Administered 2018-01-12: 4 mg via INTRAVENOUS
  Filled 2018-01-12: qty 2

## 2018-01-12 MED ORDER — PRAVASTATIN SODIUM 20 MG PO TABS
20.0000 mg | ORAL_TABLET | ORAL | Status: DC
  Administered 2018-01-12 – 2018-01-14 (×2): 20 mg via ORAL
  Filled 2018-01-12 (×2): qty 1

## 2018-01-12 MED ORDER — DEXAMETHASONE SODIUM PHOSPHATE 10 MG/ML IJ SOLN
INTRAMUSCULAR | Status: DC | PRN
  Administered 2018-01-12: 10 mg via INTRAVENOUS

## 2018-01-12 MED ORDER — SULFAMETHOXAZOLE-TRIMETHOPRIM 800-160 MG PO TABS: 1.0000 | ORAL_TABLET | Freq: Two times a day (BID) | ORAL | 0 refills | Status: DC

## 2018-01-12 MED ORDER — ROPIVACAINE HCL 5 MG/ML IJ SOLN
INTRAMUSCULAR | Status: DC | PRN
  Administered 2018-01-12: 20 mL via PERINEURAL

## 2018-01-12 MED ORDER — PHENYLEPHRINE HCL 10 MG/ML IJ SOLN
INTRAVENOUS | Status: DC | PRN
  Administered 2018-01-12: 25 ug/min via INTRAVENOUS

## 2018-01-12 MED ORDER — LOSARTAN POTASSIUM 50 MG PO TABS
50.0000 mg | ORAL_TABLET | Freq: Every day | ORAL | Status: DC
  Administered 2018-01-12 – 2018-01-14 (×3): 50 mg via ORAL
  Filled 2018-01-12 (×3): qty 1

## 2018-01-12 MED ORDER — METHOCARBAMOL 500 MG PO TABS
ORAL_TABLET | ORAL | Status: AC
  Filled 2018-01-12: qty 1

## 2018-01-12 MED ORDER — TRANEXAMIC ACID 1000 MG/10ML IV SOLN
1000.0000 mg | Freq: Once | INTRAVENOUS | Status: AC
  Administered 2018-01-12: 1000 mg via INTRAVENOUS
  Filled 2018-01-12: qty 10

## 2018-01-12 MED ORDER — 0.9 % SODIUM CHLORIDE (POUR BTL) OPTIME
TOPICAL | Status: DC | PRN
Start: 2018-01-12 — End: 2018-01-12
  Administered 2018-01-12: 1000 mL

## 2018-01-12 MED ORDER — PROPOFOL 10 MG/ML IV BOLUS
INTRAVENOUS | Status: AC
  Filled 2018-01-12: qty 20

## 2018-01-12 MED ORDER — LIDOCAINE HCL (CARDIAC) 20 MG/ML IV SOLN
INTRAVENOUS | Status: DC | PRN
  Administered 2018-01-12: 50 mg via INTRATRACHEAL

## 2018-01-12 MED ORDER — ONDANSETRON HCL 4 MG/2ML IJ SOLN: 4.0000 mg | Freq: Four times a day (QID) | INTRAMUSCULAR | Status: DC | PRN

## 2018-01-12 MED ORDER — ONDANSETRON HCL 4 MG PO TABS: 4.0000 mg | ORAL_TABLET | Freq: Three times a day (TID) | ORAL | 0 refills | Status: DC | PRN

## 2018-01-12 MED ORDER — BUPROPION HCL 100 MG PO TABS
100.0000 mg | ORAL_TABLET | Freq: Two times a day (BID) | ORAL | Status: DC
  Administered 2018-01-12 – 2018-01-14 (×4): 100 mg via ORAL
  Filled 2018-01-12 (×7): qty 1

## 2018-01-12 MED ORDER — ALUM & MAG HYDROXIDE-SIMETH 200-200-20 MG/5ML PO SUSP
30.0000 mL | ORAL | Status: DC | PRN
  Administered 2018-01-12 – 2018-01-14 (×5): 30 mL via ORAL
  Filled 2018-01-12 (×5): qty 30

## 2018-01-12 MED ORDER — OXYCODONE HCL ER 10 MG PO T12A: 10.0000 mg | EXTENDED_RELEASE_TABLET | Freq: Two times a day (BID) | ORAL | 0 refills | Status: DC

## 2018-01-12 MED ORDER — SODIUM CHLORIDE 0.9 % IR SOLN
Status: DC | PRN
  Administered 2018-01-12 (×2): 3000 mL

## 2018-01-12 MED ORDER — HYDROCHLOROTHIAZIDE 25 MG PO TABS
25.0000 mg | ORAL_TABLET | Freq: Every day | ORAL | Status: DC
  Administered 2018-01-12 – 2018-01-14 (×3): 25 mg via ORAL
  Filled 2018-01-12 (×3): qty 1

## 2018-01-12 MED ORDER — METHOCARBAMOL 750 MG PO TABS: 750.0000 mg | ORAL_TABLET | Freq: Two times a day (BID) | ORAL | 0 refills | Status: DC | PRN

## 2018-01-12 MED ORDER — OXYCODONE HCL 5 MG PO TABS
ORAL_TABLET | ORAL | Status: AC
  Administered 2018-01-13: 5 mg via ORAL
  Filled 2018-01-12: qty 1

## 2018-01-12 MED ORDER — CEFAZOLIN SODIUM-DEXTROSE 2-4 GM/100ML-% IV SOLN
2.0000 g | Freq: Three times a day (TID) | INTRAVENOUS | Status: AC
  Administered 2018-01-12 – 2018-01-13 (×2): 2 g via INTRAVENOUS
  Filled 2018-01-12 (×3): qty 100

## 2018-01-12 MED ORDER — OXYCODONE HCL ER 15 MG PO T12A
15.0000 mg | EXTENDED_RELEASE_TABLET | Freq: Two times a day (BID) | ORAL | Status: DC
  Administered 2018-01-12 – 2018-01-14 (×5): 15 mg via ORAL
  Filled 2018-01-12 (×5): qty 1

## 2018-01-12 MED ORDER — ACETAMINOPHEN 500 MG PO TABS
1000.0000 mg | ORAL_TABLET | Freq: Four times a day (QID) | ORAL | Status: AC
  Administered 2018-01-12 – 2018-01-13 (×4): 1000 mg via ORAL
  Filled 2018-01-12 (×4): qty 2

## 2018-01-12 MED ORDER — MAGNESIUM CITRATE PO SOLN: 1.0000 | Freq: Once | ORAL | Status: DC | PRN

## 2018-01-12 MED ORDER — OXYCODONE HCL 5 MG PO TABS
5.0000 mg | ORAL_TABLET | Freq: Once | ORAL | Status: AC | PRN
  Administered 2018-01-12: 5 mg via ORAL

## 2018-01-12 MED ORDER — METHOCARBAMOL 1000 MG/10ML IJ SOLN: 500.0000 mg | Freq: Four times a day (QID) | INTRAMUSCULAR | Status: DC | PRN

## 2018-01-12 MED ORDER — DIPHENHYDRAMINE HCL 12.5 MG/5ML PO ELIX: 25.0000 mg | ORAL_SOLUTION | ORAL | Status: DC | PRN

## 2018-01-12 MED ORDER — OXYCODONE HCL 5 MG PO TABS
10.0000 mg | ORAL_TABLET | ORAL | Status: DC | PRN
  Administered 2018-01-12 – 2018-01-14 (×2): 10 mg via ORAL
  Filled 2018-01-12 (×2): qty 2

## 2018-01-12 MED ORDER — MIDAZOLAM HCL 2 MG/2ML IJ SOLN
INTRAMUSCULAR | Status: AC
  Filled 2018-01-12: qty 2

## 2018-01-12 MED ORDER — BUPIVACAINE LIPOSOME 1.3 % IJ SUSP
20.0000 mL | INTRAMUSCULAR | Status: AC
Start: 2018-01-12 — End: 2018-01-12
  Administered 2018-01-12: 20 mL
  Filled 2018-01-12: qty 20

## 2018-01-12 MED ORDER — LACTATED RINGERS IV SOLN
INTRAVENOUS | Status: DC
  Administered 2018-01-12 (×2): via INTRAVENOUS

## 2018-01-12 MED ORDER — KETOROLAC TROMETHAMINE 15 MG/ML IJ SOLN
30.0000 mg | Freq: Four times a day (QID) | INTRAMUSCULAR | Status: AC
  Administered 2018-01-12 – 2018-01-13 (×4): 30 mg via INTRAVENOUS
  Filled 2018-01-12 (×4): qty 2

## 2018-01-12 MED ORDER — METHOCARBAMOL 500 MG PO TABS
500.0000 mg | ORAL_TABLET | Freq: Four times a day (QID) | ORAL | Status: DC | PRN
  Administered 2018-01-12 – 2018-01-14 (×2): 500 mg via ORAL
  Filled 2018-01-12: qty 1

## 2018-01-12 SURGICAL SUPPLY — 68 items
ALCOHOL ISOPROPYL (RUBBING) (MISCELLANEOUS) ×2 IMPLANT
BAG DECANTER FOR FLEXI CONT (MISCELLANEOUS) ×2 IMPLANT
BANDAGE ACE 6X5 VEL STRL LF (GAUZE/BANDAGES/DRESSINGS) ×4 IMPLANT
BANDAGE ESMARK 6X9 LF (GAUZE/BANDAGES/DRESSINGS) ×1 IMPLANT
BENZOIN TINCTURE PRP APPL 2/3 (GAUZE/BANDAGES/DRESSINGS) ×2 IMPLANT
BLADE SAW SGTL 13.0X1.19X90.0M (BLADE) ×2 IMPLANT
BNDG ESMARK 6X9 LF (GAUZE/BANDAGES/DRESSINGS) ×2
BOWL SMART MIX CTS (DISPOSABLE) ×2 IMPLANT
CAPT KNEE TOTAL 3 ×2 IMPLANT
CEMENT BONE REFOBACIN R1X40 US (Cement) ×4 IMPLANT
CLSR STERI-STRIP ANTIMIC 1/2X4 (GAUZE/BANDAGES/DRESSINGS) ×2 IMPLANT
COVER SURGICAL LIGHT HANDLE (MISCELLANEOUS) ×2 IMPLANT
CUFF TOURNIQUET SINGLE 34IN LL (TOURNIQUET CUFF) IMPLANT
CUFF TOURNIQUET SINGLE 44IN (TOURNIQUET CUFF) ×2 IMPLANT
DRAPE EXTREMITY T 121X128X90 (DRAPE) ×2 IMPLANT
DRAPE HALF SHEET 40X57 (DRAPES) ×2 IMPLANT
DRAPE INCISE IOBAN 66X45 STRL (DRAPES) ×2 IMPLANT
DRAPE ORTHO SPLIT 77X108 STRL (DRAPES) ×2
DRAPE SURG 17X11 SM STRL (DRAPES) ×4 IMPLANT
DRAPE SURG ORHT 6 SPLT 77X108 (DRAPES) ×2 IMPLANT
DRSG AQUACEL AG ADV 3.5X14 (GAUZE/BANDAGES/DRESSINGS) ×2 IMPLANT
DURAPREP 26ML APPLICATOR (WOUND CARE) ×6 IMPLANT
ELECT CAUTERY BLADE 6.4 (BLADE) ×2 IMPLANT
ELECT REM PT RETURN 9FT ADLT (ELECTROSURGICAL) ×2
ELECTRODE REM PT RTRN 9FT ADLT (ELECTROSURGICAL) ×1 IMPLANT
GLOVE BIO SURGEON STRL SZ7 (GLOVE) ×2 IMPLANT
GLOVE BIOGEL PI IND STRL 6.5 (GLOVE) ×1 IMPLANT
GLOVE BIOGEL PI IND STRL 7.0 (GLOVE) ×1 IMPLANT
GLOVE BIOGEL PI INDICATOR 6.5 (GLOVE) ×1
GLOVE BIOGEL PI INDICATOR 7.0 (GLOVE) ×1
GLOVE ECLIPSE 7.0 STRL STRAW (GLOVE) ×2 IMPLANT
GLOVE SKINSENSE NS SZ7.5 (GLOVE) ×1
GLOVE SKINSENSE STRL SZ7.5 (GLOVE) ×1 IMPLANT
GLOVE SURG SS PI 6.5 STRL IVOR (GLOVE) ×4 IMPLANT
GLOVE SURG SYN 7.5  E (GLOVE) ×4
GLOVE SURG SYN 7.5 E (GLOVE) ×4 IMPLANT
GOWN STRL REIN XL XLG (GOWN DISPOSABLE) ×2 IMPLANT
GOWN STRL REUS W/ TWL LRG LVL3 (GOWN DISPOSABLE) ×3 IMPLANT
GOWN STRL REUS W/TWL LRG LVL3 (GOWN DISPOSABLE) ×3
HANDPIECE INTERPULSE COAX TIP (DISPOSABLE) ×1
HOOD PEEL AWAY FLYTE STAYCOOL (MISCELLANEOUS) ×6 IMPLANT
IMMOBILIZER KNEE 22 (SOFTGOODS) ×2 IMPLANT
KIT BASIN OR (CUSTOM PROCEDURE TRAY) ×2 IMPLANT
KIT ROOM TURNOVER OR (KITS) ×2 IMPLANT
MANIFOLD NEPTUNE II (INSTRUMENTS) ×2 IMPLANT
MARKER SKIN DUAL TIP RULER LAB (MISCELLANEOUS) ×2 IMPLANT
NEEDLE SPNL 18GX3.5 QUINCKE PK (NEEDLE) ×2 IMPLANT
NS IRRIG 1000ML POUR BTL (IV SOLUTION) ×2 IMPLANT
PACK TOTAL JOINT (CUSTOM PROCEDURE TRAY) ×2 IMPLANT
PAD ARMBOARD 7.5X6 YLW CONV (MISCELLANEOUS) ×4 IMPLANT
SAW OSC TIP CART 19.5X105X1.3 (SAW) ×2 IMPLANT
SET HNDPC FAN SPRY TIP SCT (DISPOSABLE) ×1 IMPLANT
STAPLER VISISTAT 35W (STAPLE) IMPLANT
SUCTION FRAZIER HANDLE 10FR (MISCELLANEOUS) ×1
SUCTION TUBE FRAZIER 10FR DISP (MISCELLANEOUS) ×1 IMPLANT
SUT ETHILON 2 0 FS 18 (SUTURE) IMPLANT
SUT MNCRL AB 4-0 PS2 18 (SUTURE) IMPLANT
SUT VIC AB 0 CT1 27 (SUTURE) ×2
SUT VIC AB 0 CT1 27XBRD ANBCTR (SUTURE) ×2 IMPLANT
SUT VIC AB 1 CTX 27 (SUTURE) ×6 IMPLANT
SUT VIC AB 2-0 CT1 27 (SUTURE) ×3
SUT VIC AB 2-0 CT1 TAPERPNT 27 (SUTURE) ×3 IMPLANT
SYR 50ML LL SCALE MARK (SYRINGE) ×2 IMPLANT
TOWEL OR 17X24 6PK STRL BLUE (TOWEL DISPOSABLE) ×2 IMPLANT
TOWEL OR 17X26 10 PK STRL BLUE (TOWEL DISPOSABLE) ×2 IMPLANT
TRAY CATH 16FR W/PLASTIC CATH (SET/KITS/TRAYS/PACK) IMPLANT
UNDERPAD 30X30 (UNDERPADS AND DIAPERS) ×2 IMPLANT
WRAP KNEE MAXI GEL POST OP (GAUZE/BANDAGES/DRESSINGS) ×2 IMPLANT

## 2018-01-12 NOTE — Anesthesia Procedure Notes (Addendum)
Procedure Name: LMA Insertion Date/Time: 01/12/2018 8:32 AM Performed by: Mariea Clonts, CRNA Pre-anesthesia Checklist: Patient identified, Emergency Drugs available, Suction available and Patient being monitored Patient Re-evaluated:Patient Re-evaluated prior to induction Oxygen Delivery Method: Circle System Utilized Preoxygenation: Pre-oxygenation with 100% oxygen Induction Type: IV induction Ventilation: Mask ventilation without difficulty LMA Size: 4.0 Tube type: Oral Number of attempts: 1 Airway Equipment and Method: Stylet and Oral airway Placement Confirmation: ETT inserted through vocal cords under direct vision,  positive ETCO2 and breath sounds checked- equal and bilateral Tube secured with: Tape Dental Injury: Teeth and Oropharynx as per pre-operative assessment

## 2018-01-12 NOTE — Op Note (Signed)
Total Knee Arthroplasty Procedure Note  Preoperative diagnosis: Right knee osteoarthritis  Postoperative diagnosis:same  Operative procedure: Right total knee arthroplasty. CPT [phone]  Surgeon: N. Eduard Roux, MD  Assist: Madalyn Rob, PA-C; necessary for the timely completion of procedure and due to complexity of procedure.  Anesthesia: Spinal, regional  Tourniquet time: 60 mins  Implants used: Smith and Nephew Femur: Legion PS 4 Tibia: 4 Patella: 32 mm, 7.5 thick Polyethylene: 11 mm  Indication: Sarah Randall is a 52 y.o. year old female with a history of knee pain. Having failed conservative management, the patient elected to proceed with a total knee arthroplasty.  We have reviewed the risk and benefits of the surgery and they elected to proceed after voicing understanding.  Procedure:  After informed consent was obtained and understanding of the risk were voiced including but not limited to bleeding, infection, damage to surrounding structures including nerves and vessels, blood clots, leg length inequality and the failure to achieve desired results, the operative extremity was marked with verbal confirmation of the patient in the holding area.   The patient was then brought to the operating room and transported to the operating room table in the supine position.  A tourniquet was applied to the operative extremity around the upper thigh. The operative limb was then prepped and draped in the usual sterile fashion and preoperative antibiotics were administered.  A time out was performed prior to the start of surgery confirming the correct extremity, preoperative antibiotic administration, as well as team members, implants and instruments available for the case. Correct surgical site was also confirmed with preoperative radiographs. The limb was then elevated for exsanguination and the tourniquet was inflated. A midline incision was made and a standard medial parapatellar  approach was performed.  The patella was prepared and sized to a 32 mm.  A cover was placed on the patella for protection from retractors.  We then turned our attention to the femur. Posterior cruciate ligament was sacrificed. Start site was drilled in the femur and the intramedullary distal femoral cutting guide was placed, set at 5 degrees valgus, taking 9 mm of distal resection. The distal cut was made. Osteophytes were then removed. Next, the proximal tibial cutting guide was placed with appropriate slope, varus/valgus alignment and depth of resection. The proximal tibial cut was made. Gap blocks were then used to assess the extension gap and alignment, and appropriate soft tissue releases were performed. Attention was turned back to the femur, which was sized using the sizing guide to a size 4. Appropriate rotation of the femoral component was determined using epicondylar axis, Whiteside's line, and assessing the flexion gap under ligament tension. The appropriate size 4-in-1 cutting block was placed and cuts were made. Posterior femoral osteophytes and uncapped bone were then removed with the curved osteotome. The tibia was sized for a size 4 component. The femoral box-cutting guide was placed and prepared for a PS femoral component. Trial components were placed, and stability was checked in full extension, mid-flexion, and deep flexion. Proper tibial rotation was determined and marked.  The patella tracked well without a lateral release. Trial components were then removed and tibial preparation performed. A posterior capsular injection comprising of 20 cc of 1.3% exparel and 40 cc of normal saline was performed for postoperative pain control. The bony surfaces were irrigated with a pulse lavage and then dried. Bone cement was vacuum mixed on the back table, and the final components sized above were cemented into place.  After cement had finished curing, excess cement was removed. The stability of the  construct was re-evaluated throughout a range of motion and found to be acceptable. The trial liner was removed, the knee was copiously irrigated, and the knee was re-evaluated for any excess bone debris. The real polyethylene liner, 11 mm thick, was inserted and checked to ensure the locking mechanism had engaged appropriately. The tourniquet was deflated and hemostasis was achieved. The wound was irrigated with normal saline.  One gram of vancomycin powder was placed in the surgical bed. A drain was not placed. Capsular closure was performed with a #1 vicryl, subcutaneous fat closed with a 0 vicryl suture, then subcutaneous tissue closed with interrupted 2.0 vicryl suture. The skin was then closed with a 3.0 monocryl. A sterile dressing was applied.  The patient was awakened in the operating room and taken to recovery in stable condition. All sponge, needle, and instrument counts were correct at the end of the case.  Position: supine  Complications: none.  Time Out: performed   Drains/Packing: none  Estimated blood loss: minimal  Returned to Recovery Room: in good condition.   Antibiotics: yes   Mechanical VTE (DVT) Prophylaxis: sequential compression devices, TED thigh-high  Chemical VTE (DVT) Prophylaxis: aspirin  Fluid Replacement  Crystalloid: see anesthesia record Blood: none  FFP: none   Specimens Removed: 1 to pathology   Sponge and Instrument Count Correct? yes   PACU: portable radiograph - knee AP and Lateral   Admission: inpatient status  Plan/RTC: Return in 2 weeks for wound check.   Weight Bearing/Load Lower Extremity: full   N. Eduard Roux, MD [place] 10:13 AM

## 2018-01-12 NOTE — Progress Notes (Signed)
Orthopedic Tech Progress Note Patient Details:  Sarah Randall [birth year] [id number]  CPM Right Knee CPM Right Knee: On Right Knee Flexion (Degrees): 90 Right Knee Extension (Degrees): 0 Additional Comments: trapeze bar patient helper  Post Interventions Patient Tolerated: Well Instructions Provided: Care of device  Hildred Priest 01/12/2018, 1:58 PM Viewed order from doctor's order list

## 2018-01-12 NOTE — Evaluation (Signed)
Physical Therapy Evaluation Patient Details Name: Sarah Randall MRN: [id number] DOB: Jul 22, 1966 Today's Date: 01/12/2018   History of Present Illness  Pt is a 52 y/o female s/p elective R TKA. PMH includes HTN, pre-diabetes, OSA, and L THA.   Clinical Impression  Pt is s/p surgery above with deficits below. Mobility limited to stand pivot X2 secondary to increased pain. Required min to min guard assist for mobility. Anticipate pt will progress well once pain controlled. Will continue to follow acutely to maximize functional mobility independence and safety.     Follow Up Recommendations DC plan and follow up therapy as arranged by surgeon;Supervision for mobility/OOB    Equipment Recommendations  None recommended by PT    Recommendations for Other Services       Precautions / Restrictions Precautions Precautions: Knee Precaution Booklet Issued: Yes (comment) Precaution Comments: Reviewed supine ther ex with pt. Limited tolerance secondary to pain.  Restrictions Weight Bearing Restrictions: Yes RLE Weight Bearing: Weight bearing as tolerated      Mobility  Bed Mobility Overal bed mobility: Needs Assistance Bed Mobility: Supine to Sit     Supine to sit: Min assist     General bed mobility comments: Min A for RLE assist. Required use of bed rails and elevated HOB.   Transfers Overall transfer level: Needs assistance Equipment used: Rolling walker (2 wheeled) Transfers: Sit to/from [facility] Sit to Stand: Min assist Stand pivot transfers: Min guard       General transfer comment: Min A for lift assist and steadying assist. Performed stand pivot X 2 to [facility] and then to recliner. Cues for sequencing using RW. Min guard for safety. Pt with increased pain, so further mobility deferred.   Ambulation/Gait         Gait velocity: Deferred secondary to increased pain.       Stairs            Wheelchair Mobility    Modified Rankin (Stroke  Patients Only)       Balance Overall balance assessment: Needs assistance Sitting-balance support: No upper extremity supported;Feet supported Sitting balance-Leahy Scale: Good     Standing balance support: Bilateral upper extremity supported;During functional activity Standing balance-Leahy Scale: Poor Standing balance comment: Reliant on BUE support                              Pertinent Vitals/Pain Pain Assessment: 0-10 Pain Score: 5  Pain Location: R knee  Pain Descriptors / Indicators: Aching;Operative site guarding Pain Intervention(s): Limited activity within patient's tolerance;Monitored during session;Repositioned    Home Living Family/patient expects to be discharged to:: Private residence Living Arrangements: Parent Available Help at Discharge: Family;Available 24 hours/day Type of Home: House Home Access: Stairs to enter Entrance Stairs-Rails: Left Entrance Stairs-Number of Steps: 4 Home Layout: One level Home Equipment: Walker - 2 wheels;Bedside commode;Cane - single point      Prior Function Level of Independence: Independent with assistive device(s)         Comments: Used cane for ambulation      Hand Dominance   Dominant Hand: Left    Extremity/Trunk Assessment   Upper Extremity Assessment Upper Extremity Assessment: Defer to OT evaluation    Lower Extremity Assessment Lower Extremity Assessment: RLE deficits/detail RLE Deficits / Details: SEnsory in tact. Deficits consistent with post op pain and weakness. Able to perform ther ex below.     Cervical / Trunk Assessment Cervical /  Trunk Assessment: Normal  Communication   Communication: No difficulties  Cognition Arousal/Alertness: Awake/alert Behavior During Therapy: WFL for tasks assessed/performed Overall Cognitive Status: Within Functional Limits for tasks assessed                                        General Comments General comments (skin  integrity, edema, etc.): Pt's fiance and daughter present during session. Educated about use of CPM as pt was asking about frequency.     Exercises Total Joint Exercises Ankle Circles/Pumps: AROM;Both;20 reps Quad Sets: AROM;Right;10 reps Towel Squeeze: AROM;Both;10 reps   Assessment/Plan    PT Assessment Patient needs continued PT services  PT Problem List Decreased strength;Decreased balance;Decreased activity tolerance;Decreased mobility;Decreased knowledge of use of DME;Decreased knowledge of precautions;Pain       PT Treatment Interventions DME instruction;Gait training;Stair training;Functional mobility training;Therapeutic activities;Therapeutic exercise;Balance training;Neuromuscular re-education;Patient/family education    PT Goals (Current goals can be found in the Care Plan section)  Acute Rehab PT Goals Patient Stated Goal: to decrease pain  PT Goal Formulation: With patient Time For Goal Achievement: 01/26/18 Potential to Achieve Goals: Good    Frequency 7X/week   Barriers to discharge        Co-evaluation               AM-PAC PT "6 Clicks" Daily Activity  Outcome Measure Difficulty turning over in bed (including adjusting bedclothes, sheets and blankets)?: A Little Difficulty moving from lying on back to sitting on the side of the bed? : Unable Difficulty sitting down on and standing up from a chair with arms (e.g., wheelchair, bedside commode, etc,.)?: Unable Help needed moving to and from a bed to chair (including a wheelchair)?: A Little Help needed walking in hospital room?: A Lot Help needed climbing 3-5 steps with a railing? : A Lot 6 Click Score: 12    End of Session Equipment Utilized During Treatment: Gait belt;Right knee immobilizer Activity Tolerance: Patient limited by pain Patient left: in chair;with call bell/phone within reach;with family/visitor present Nurse Communication: Mobility status;Other (comment)(pt voided ) PT Visit  Diagnosis: Other abnormalities of gait and mobility (R26.89);Pain Pain - Right/Left: Right Pain - part of body: Knee    Time: 1532-1600 PT Time Calculation (min) (ACUTE ONLY): 28 min   Charges:   PT Evaluation $PT Eval Low Complexity: 1 Low PT Treatments $Therapeutic Activity: 8-22 mins   PT G Codes:        Leighton Ruff, PT, DPT  Acute Rehabilitation Services  Pager: [phone]   Rudean Hitt 01/12/2018, 4:14 PM

## 2018-01-12 NOTE — Anesthesia Procedure Notes (Signed)
Anesthesia Regional Block: Adductor canal block   Pre-Anesthetic Checklist: ,, timeout performed, Correct Patient, Correct Site, Correct Laterality, Correct Procedure, Correct Position, site marked, Risks and benefits discussed,  Surgical consent,  Pre-op evaluation,  At surgeon's request and post-op pain management  Laterality: Right  Prep: chloraprep       Needles:  Injection technique: Single-shot  Needle Type: Echogenic Needle     Needle Length: 9cm  Needle Gauge: 21     Additional Needles:   Narrative:  Start time: 01/12/2018 8:11 AM End time: 01/12/2018 8:19 AM Injection made incrementally with aspirations every 5 mL.  Performed by: Personally  Anesthesiologist: Albertha Ghee, MD  Additional Notes: Pt tolerated the procedure well.

## 2018-01-12 NOTE — Discharge Instructions (Signed)

## 2018-01-12 NOTE — Transfer of Care (Signed)
Immediate Anesthesia Transfer of Care Note  Patient: Sarah Randall  Procedure(s) Performed: RIGHT TOTAL KNEE ARTHROPLASTY (Right Knee)  Patient Location: PACU  Anesthesia Type:GA combined with regional for post-op pain  Level of Consciousness: awake, alert  and oriented  Airway & Oxygen Therapy: Patient Spontanous Breathing and Patient connected to nasal cannula oxygen  Post-op Assessment: Report given to RN and Post -op Vital signs reviewed and stable  Post vital signs: Reviewed and stable  Last Vitals:  Vitals:   01/12/18 0650 01/12/18 1050  BP: 123/82   Pulse: 81   Resp: 20   Temp: 36.9 C 36.7 C    Last Pain:  Vitals:   01/12/18 0701  TempSrc:   PainSc: 5       Patients Stated Pain Goal: 2 (82/70/78 [id number])  Complications: No apparent anesthesia complications

## 2018-01-12 NOTE — Anesthesia Preprocedure Evaluation (Signed)
Anesthesia Evaluation  Patient identified by MRN, date of birth, ID band Patient awake    Reviewed: Allergy & Precautions, H&P , NPO status , Patient's Chart, lab work & pertinent test results  Airway Mallampati: II   Neck ROM: full    Dental   Pulmonary sleep apnea , Current Smoker,    breath sounds clear to auscultation       Cardiovascular hypertension,  Rhythm:regular Rate:Normal     Neuro/Psych PSYCHIATRIC DISORDERS Depression    GI/Hepatic GERD  ,  Endo/Other  Morbid obesity  Renal/GU      Musculoskeletal  (+) Arthritis ,   Abdominal   Peds  Hematology   Anesthesia Other Findings   Reproductive/Obstetrics                             Anesthesia Physical Anesthesia Plan  ASA: II  Anesthesia Plan: Spinal   Post-op Pain Management:  Regional for Post-op pain   Induction: Intravenous  PONV Risk Score and Plan: 1 and Ondansetron, Propofol infusion and Treatment may vary due to age or medical condition  Airway Management Planned: Simple Face Mask  Additional Equipment:   Intra-op Plan:   Post-operative Plan:   Informed Consent: I have reviewed the patients History and Physical, chart, labs and discussed the procedure including the risks, benefits and alternatives for the proposed anesthesia with the patient or authorized representative who has indicated his/her understanding and acceptance.     Plan Discussed with: CRNA, Anesthesiologist and Surgeon  Anesthesia Plan Comments:         Anesthesia Quick Evaluation

## 2018-01-12 NOTE — H&P (Signed)
PREOPERATIVE H&P  Chief Complaint: right knee degenerative joint disease  HPI: Sarah Randall is a 52 y.o. female who presents for surgical treatment of right knee degenerative joint disease.  She denies any changes in medical history.  Past Medical History:  Diagnosis Date  . Arthritis   . Depression   . GERD (gastroesophageal reflux disease)    occasionally takes OTC  . Hypertension   . Pre-diabetes   . Sleep apnea    tested 2014 - unable to tolerate machine   Past Surgical History:  Procedure Laterality Date  . TOTAL HIP ARTHROPLASTY Left 09/22/2017   Procedure: LEFT TOTAL HIP ARTHROPLASTY ANTERIOR APPROACH;  Surgeon: Leandrew Koyanagi, MD;  Location: [facility];  Service: Orthopedics;  Laterality: Left;  . TUBAL LIGATION     Social History   Socioeconomic History  . Marital status: Single    Spouse name: None  . Number of children: 2  . Years of education: 9  . Highest education level: None  Social Needs  . Financial resource strain: None  . Food insecurity - worry: None  . Food insecurity - inability: None  . Transportation needs - medical: None  . Transportation needs - non-medical: None  Occupational History  . Occupation: unemployed  Tobacco Use  . Smoking status: Current Some Day Smoker    Packs/day: 0.25    Years: 30.00    Pack years: 7.50    Types: Cigarettes  . Smokeless tobacco: Never Used  Substance and Sexual Activity  . Alcohol use: Yes    Comment: occasional  . Drug use: No    Comment: smoke 1 joint cannibis 1 week ago.  Marland Kitchen Sexual activity: Yes    Birth control/protection: None  Other Topics Concern  . None  Social History Narrative  . None   Family History  Problem Relation Age of Onset  . Hypertension Mother   . Diabetes Mother   . Hypertension Father   . Breast cancer Maternal Aunt   . Lung cancer Maternal Aunt    Allergies  Allergen Reactions  . Lipitor [Atorvastatin] Other (See Comments)    Stabbing pains in legs  . Zoloft  [Sertraline Hcl] Other (See Comments)    tremors   Prior to Admission medications   Medication Sig Start Date End Date Taking? Authorizing Provider  buPROPion (WELLBUTRIN) 100 MG tablet Take 1 tablet (100 mg total) by mouth 2 (two) times daily. 08/10/17  Yes Ladell Pier, MD  hydrochlorothiazide (HYDRODIURIL) 25 MG tablet Take 1 tablet (25 mg total) by mouth daily. 06/25/17  Yes Ladell Pier, MD  losartan (COZAAR) 50 MG tablet Take 1 tablet (50 mg total) by mouth daily. 06/25/17  Yes Ladell Pier, MD  meloxicam (MOBIC) 15 MG tablet Take 1 tablet (15 mg total) by mouth daily. 06/25/17  Yes Ladell Pier, MD  naproxen sodium (ALEVE) 220 MG tablet Take 440 mg by mouth daily as needed (for pain or headache).   Yes [provider]  pravastatin (PRAVACHOL) 20 MG tablet 1 tab Q Mon/Wed and Fri Patient taking differently: Take 20 mg by mouth every Monday, Wednesday, and Friday.  08/10/17  Yes Ladell Pier, MD  aspirin EC 325 MG tablet Take 1 tablet (325 mg total) by mouth 2 (two) times daily. Patient not taking: Reported on 12/29/2017 09/22/17   Leandrew Koyanagi, MD  HYDROcodone-acetaminophen (NORCO) 5-325 MG tablet Take 1 tablet by mouth at bedtime as needed. Patient not taking: Reported  on 12/20/2017 11/08/17   Leandrew Koyanagi, MD  HYDROcodone-acetaminophen (NORCO) 7.5-325 MG tablet Take 1-2 tablets 2 (two) times daily as needed by mouth for moderate pain. Patient not taking: Reported on 12/20/2017 10/22/17   Leandrew Koyanagi, MD  methocarbamol (ROBAXIN) 750 MG tablet Take 1 tablet (750 mg total) by mouth 2 (two) times daily as needed for muscle spasms. Patient not taking: Reported on 12/20/2017 09/22/17   Leandrew Koyanagi, MD  mupirocin ointment (BACTROBAN) 2 % Apply 1 application 2 (two) times daily topically. Patient not taking: Reported on 12/29/2017 10/22/17   Leandrew Koyanagi, MD  ondansetron (ZOFRAN) 4 MG tablet Take 1-2 tablets (4-8 mg total) by mouth every 8 (eight) hours as  needed for nausea or vomiting. Patient not taking: Reported on 12/29/2017 09/22/17   Leandrew Koyanagi, MD  oxyCODONE (OXY IR/ROXICODONE) 5 MG immediate release tablet Take 1-3 tablets (5-15 mg total) by mouth every 4 (four) hours as needed. Patient not taking: Reported on 12/20/2017 09/22/17   Leandrew Koyanagi, MD  oxyCODONE (OXYCONTIN) 10 mg 12 hr tablet Take 1 tablet (10 mg total) by mouth every 12 (twelve) hours. Patient not taking: Reported on 12/20/2017 09/22/17   Leandrew Koyanagi, MD  promethazine (PHENERGAN) 25 MG tablet Take 1 tablet (25 mg total) by mouth every 6 (six) hours as needed for nausea. Patient not taking: Reported on 12/29/2017 09/22/17   Leandrew Koyanagi, MD  senna-docusate (SENOKOT S) 8.6-50 MG tablet Take 1 tablet by mouth at bedtime as needed. Patient not taking: Reported on 12/29/2017 09/22/17   Leandrew Koyanagi, MD  tiZANidine (ZANAFLEX) 4 MG tablet TAKE 1 TAB PO 3 times daily as needed Patient not taking: Reported on 12/20/2017 09/27/17   Leandrew Koyanagi, MD  traMADol (ULTRAM) 50 MG tablet Take 1 tablet (50 mg total) by mouth every 8 (eight) hours as needed. Patient not taking: Reported on 12/20/2017 06/25/17   Ladell Pier, MD     Positive ROS: All other systems have been reviewed and were otherwise negative with the exception of those mentioned in the HPI and as above.  Physical Exam: General: Alert, no acute distress Cardiovascular: No pedal edema Respiratory: No cyanosis, no use of accessory musculature GI: abdomen soft Skin: No lesions in the area of chief complaint Neurologic: Sensation intact distally Psychiatric: Patient is competent for consent with normal mood and affect Lymphatic: no lymphedema  MUSCULOSKELETAL: exam stable  Assessment: right knee degenerative joint disease  Plan: Plan for Procedure(s): RIGHT TOTAL KNEE ARTHROPLASTY  The risks benefits and alternatives were discussed with the patient including but not limited to the risks of nonoperative  treatment, versus surgical intervention including infection, bleeding, nerve injury,  blood clots, cardiopulmonary complications, morbidity, mortality, among others, and they were willing to proceed.   Eduard Roux, MD   01/12/2018 8:26 AM

## 2018-01-12 NOTE — Progress Notes (Signed)
Orthopedic Tech Progress Note Patient Details:  Sarah Randall [birth year] [id number]  CPM Right Knee CPM Right Knee: On Right Knee Flexion (Degrees): 90 Right Knee Extension (Degrees): 0 Additional Comments: trapeze bar patient helper  Post Interventions Patient Tolerated: Well Instructions Provided: Care of device  Hildred Priest 01/12/2018, 1:58 PM Viewed order from doctor's order list

## 2018-01-13 ENCOUNTER — Encounter (HOSPITAL_COMMUNITY): Payer: Self-pay | Admitting: Orthopaedic Surgery

## 2018-01-13 LAB — GLUCOSE, CAPILLARY
Glucose-Capillary: 116 mg/dL — ABNORMAL HIGH (ref 65–99)
Glucose-Capillary: 118 mg/dL — ABNORMAL HIGH (ref 65–99)
Glucose-Capillary: 196 mg/dL — ABNORMAL HIGH (ref 65–99)
Glucose-Capillary: 197 mg/dL — ABNORMAL HIGH (ref 65–99)

## 2018-01-13 LAB — BASIC METABOLIC PANEL
Anion gap: 14 (ref 5–15)
BUN: 20 mg/dL (ref 6–20)
CO2: 20 mmol/L — ABNORMAL LOW (ref 22–32)
Calcium: 8.3 mg/dL — ABNORMAL LOW (ref 8.9–10.3)
Chloride: 97 mmol/L — ABNORMAL LOW (ref 101–111)
Creatinine, Ser: 1.11 mg/dL — ABNORMAL HIGH (ref 0.44–1.00)
GFR calc Af Amer: >60 mL/min (ref >60)
GFR calc non Af Amer: 56 mL/min — ABNORMAL LOW (ref >60)
Glucose, Bld: 130 mg/dL — ABNORMAL HIGH (ref 65–99)
Potassium: 3.4 mmol/L — ABNORMAL LOW (ref 3.5–5.1)
Sodium: 131 mmol/L — ABNORMAL LOW (ref 135–145)

## 2018-01-13 LAB — CBC
HCT: 41.5 % (ref 36.0–46.0)
Hemoglobin: 13.9 g/dL (ref 12.0–15.0)
MCH: 30.6 pg (ref 26.0–34.0)
MCHC: 33.5 g/dL (ref 30.0–36.0)
MCV: 91.4 fL (ref 78.0–100.0)
Platelets: 322 10*3/uL (ref 150–400)
RBC: 4.54 MIL/uL (ref 3.87–5.11)
RDW: 13.6 % (ref 11.5–15.5)
WBC: 19.6 10*3/uL — ABNORMAL HIGH (ref 4.0–10.5)

## 2018-01-13 NOTE — Evaluation (Signed)
Occupational Therapy Evaluation Patient Details Name: Sarah Randall MRN: [id number] DOB: 1966/03/11 Today's Date: 01/13/2018    History of Present Illness Pt is a 52 y/o female s/p elective R TKA. PMH includes HTN, pre-diabetes, OSA, and L THA.    Clinical Impression   Pt admitted with the above diagnoses and presents with below problem list. Pt will benefit from continued acute OT to address the below listed deficits and maximize independence with basic ADLs prior to d/c home. PTA pt was mod I with ADLs. Pt is currently min guard with functional transfers and mobility, mod A for LB ADLs. Plan to address LB ADL technique and AE next session.       Follow Up Recommendations  DC plan and follow up therapy as arranged by surgeon;Supervision - Intermittent    Equipment Recommendations  None recommended by OT    Recommendations for Other Services       Precautions / Restrictions Precautions Precautions: Knee Precaution Booklet Issued: Yes (comment) Precaution Comments: reviewed precautions Restrictions Weight Bearing Restrictions: Yes RLE Weight Bearing: Weight bearing as tolerated      Mobility Bed Mobility Overal bed mobility: Needs Assistance Bed Mobility: Sit to Supine;Supine to Sit     Supine to sit: Min guard Sit to supine: Min guard   General bed mobility comments: Pt able to advance RLE and powerup with no physical assist  Transfers Overall transfer level: Needs assistance Equipment used: Rolling walker (2 wheeled) Transfers: Sit to/from Stand Sit to Stand: Min guard         General transfer comment: to/from EOB and 3n1 over toilet    Balance Overall balance assessment: Needs assistance Sitting-balance support: No upper extremity supported;Feet supported Sitting balance-Leahy Scale: Good     Standing balance support: Bilateral upper extremity supported;During functional activity Standing balance-Leahy Scale: Fair Standing balance comment: relient on  RW for dynamic mobility, able to balance static without.                           ADL either performed or assessed with clinical judgement   ADL Overall ADL's : Needs assistance/impaired Eating/Feeding: Set up;Sitting   Grooming: Min guard;Standing   Upper Body Bathing: Set up;Sitting   Lower Body Bathing: Moderate assistance;Sit to/from stand Lower Body Bathing Details (indicate cue type and reason): min guard for transfer, assist for tasks Upper Body Dressing : Set up;Sitting   Lower Body Dressing: Moderate assistance Lower Body Dressing Details (indicate cue type and reason): min guard for transfer, assist for tasks; discussed sock aid Toilet Transfer: Min guard;Ambulation;BSC   Toileting- Clothing Manipulation and Hygiene: Minimal assistance;Sit to/from stand   Tub/ Shower Transfer: Tub transfer;Minimal assistance;Tub bench;Rolling walker   Functional mobility during ADLs: Min guard;Rolling walker General ADL Comments: Pt completed bed mobility, toilet transfer, and in-room functional mobility     Vision         Perception     Praxis      Pertinent Vitals/Pain Pain Assessment: Faces Pain Score: 5  Faces Pain Scale: Hurts little more Pain Location: R knee  Pain Descriptors / Indicators: Aching;Operative site guarding Pain Intervention(s): Limited activity within patient's tolerance;Monitored during session;Repositioned     Hand Dominance Left   Extremity/Trunk Assessment Upper Extremity Assessment Upper Extremity Assessment: Overall WFL for tasks assessed   Lower Extremity Assessment Lower Extremity Assessment: Defer to PT evaluation   Cervical / Trunk Assessment Cervical / Trunk Assessment: Normal   Communication Communication Communication:  No difficulties   Cognition Arousal/Alertness: Awake/alert Behavior During Therapy: WFL for tasks assessed/performed Overall Cognitive Status: Within Functional Limits for tasks assessed                                      General Comments  no family present. reviewed therex with patient reports no questions.     Exercises     Shoulder Instructions      Home Living Family/patient expects to be discharged to:: Private residence Living Arrangements: Parent Available Help at Discharge: Family;Available 24 hours/day Type of Home: House Home Access: Stairs to enter [employer] of Steps: 4 Entrance Stairs-Rails: Left Home Layout: One level     Bathroom Shower/Tub: Teacher, early years/pre: Handicapped height     Home Equipment: Environmental consultant - 2 wheels;Bedside commode;Cane - single point;Tub bench          Prior Functioning/Environment Level of Independence: Independent with assistive device(s)        Comments: Used cane for ambulation         OT Problem List: Impaired balance (sitting and/or standing);Decreased knowledge of use of DME or AE;Decreased knowledge of precautions;Pain;Obesity      OT Treatment/Interventions: Self-care/ADL training;DME and/or AE instruction;Therapeutic activities;Patient/family education;Balance training    OT Goals(Current goals can be found in the care plan section) Acute Rehab OT Goals Patient Stated Goal: to decrease pain  OT Goal Formulation: With patient Time For Goal Achievement: 01/20/18 Potential to Achieve Goals: Good ADL Goals Pt Will Perform Lower Body Bathing: with min guard assist;with adaptive equipment;sit to/from stand Pt Will Perform Lower Body Dressing: with min guard assist;with adaptive equipment;sit to/from stand Pt Will Transfer to Toilet: with modified independence;ambulating Pt Will Perform Toileting - Clothing Manipulation and hygiene: with modified independence;sit to/from stand Pt Will Perform Tub/Shower Transfer: with supervision;Tub transfer;ambulating;tub bench;rolling walker  OT Frequency: Min 2X/week   Barriers to D/C:            Co-evaluation               AM-PAC PT "6 Clicks" Daily Activity     Outcome Measure Help from another person eating meals?: None Help from another person taking care of personal grooming?: None Help from another person toileting, which includes using toliet, bedpan, or urinal?: A Little Help from another person bathing (including washing, rinsing, drying)?: A Little Help from another person to put on and taking off regular upper body clothing?: None Help from another person to put on and taking off regular lower body clothing?: A Little 6 Click Score: 21   End of Session Equipment Utilized During Treatment: Rolling walker;Right knee immobilizer;Other (comment)(RKI on pt upon arrival) CPM Right Knee CPM Right Knee: On Right Knee Flexion (Degrees): (90)  Activity Tolerance: Patient tolerated treatment well Patient left: in bed;with call bell/phone within reach  OT Visit Diagnosis: Unsteadiness on feet (R26.81);Pain Pain - Right/Left: Right Pain - part of body: Knee                Time: 5170-0174 OT Time Calculation (min): 22 min Charges:  OT General Charges $OT Visit: 1 Visit OT Evaluation $OT Eval Low Complexity: 1 Low G-Codes:       Hortencia Pilar 01/13/2018, 11:02 AM

## 2018-01-13 NOTE — Progress Notes (Signed)
Physical Therapy Treatment Patient Details Name: Sarah Randall MRN: [id number] DOB: 03-05-1966 Today's Date: 01/13/2018    History of Present Illness Pt is a 52 y/o female s/p elective R TKA. PMH includes HTN, pre-diabetes, OSA, and L THA.     PT Comments    Patient has progressed very well with therapy. Supervision for ambulation and transfers, min guard for stairs this afternoon. Plan to visit patient tomorrow AM to reinforce stairs and hopefully educate family on proper guarding before safe return home once medically cleared for d/c.    Follow Up Recommendations  DC plan and follow up therapy as arranged by surgeon;Supervision for mobility/OOB     Equipment Recommendations  None recommended by PT    Recommendations for Other Services       Precautions / Restrictions Precautions Precautions: Knee Precaution Booklet Issued: Yes (comment) Precaution Comments: reviewed precautions Restrictions Weight Bearing Restrictions: Yes RLE Weight Bearing: Weight bearing as tolerated    Mobility  Bed Mobility Overal bed mobility: Needs Assistance Bed Mobility: Sit to Supine;Supine to Sit     Supine to sit: Supervision Sit to supine: Supervision   General bed mobility comments: supervision for safety  Transfers Overall transfer level: Needs assistance Equipment used: Rolling walker (2 wheeled) Transfers: Sit to/from Stand Sit to Stand: Supervision         General transfer comment: Superivsion for sit<>stand x4 on toliet and bed. light cues for safety with RW.   Ambulation/Gait Ambulation/Gait assistance: Supervision Ambulation Distance (Feet): 100 Feet Assistive device: Rolling walker (2 wheeled) Gait Pattern/deviations: Step-to pattern;Antalgic;Decreased step length - left;Decreased stance time - right Gait velocity: decreased   General Gait Details: Pt with slightly improved mechanic but still slow, step to gait.    Stairs Stairs: Yes   Stair Management: One  rail Left Number of Stairs: 12 General stair comments: Progressed to 1 UE support, cues for sequencing and safety. min guard for safety  Wheelchair Mobility    Modified Rankin (Stroke Patients Only)       Balance Overall balance assessment: Needs assistance Sitting-balance support: No upper extremity supported;Feet supported Sitting balance-Leahy Scale: Good     Standing balance support: Bilateral upper extremity supported;During functional activity Standing balance-Leahy Scale: Fair Standing balance comment: relient on RW for dynamic mobility, able to balance static without.                            Cognition Arousal/Alertness: Awake/alert Behavior During Therapy: WFL for tasks assessed/performed Overall Cognitive Status: Within Functional Limits for tasks assessed                                        Exercises      General Comments        Pertinent Vitals/Pain Pain Assessment: Faces Faces Pain Scale: Hurts little more Pain Location: R knee  Pain Descriptors / Indicators: Aching;Operative site guarding Pain Intervention(s): Limited activity within patient's tolerance;Premedicated before session;Monitored during session;Repositioned    Home Living                      Prior Function            PT Goals (current goals can now be found in the care plan section) Acute Rehab PT Goals Patient Stated Goal: to decrease pain  PT Goal Formulation: With patient Time  For Goal Achievement: 01/26/18 Potential to Achieve Goals: Good Progress towards PT goals: Progressing toward goals    Frequency    7X/week      PT Plan Current plan remains appropriate    Co-evaluation              AM-PAC PT "6 Clicks" Daily Activity  Outcome Measure  Difficulty turning over in bed (including adjusting bedclothes, sheets and blankets)?: A Little Difficulty moving from lying on back to sitting on the side of the bed? : A  Little Difficulty sitting down on and standing up from a chair with arms (e.g., wheelchair, bedside commode, etc,.)?: A Little Help needed moving to and from a bed to chair (including a wheelchair)?: A Little Help needed walking in hospital room?: A Little Help needed climbing 3-5 steps with a railing? : A Little 6 Click Score: 18    End of Session Equipment Utilized During Treatment: Gait belt Activity Tolerance: Patient tolerated treatment well Patient left: with call bell/phone within reach;in bed Nurse Communication: Mobility status PT Visit Diagnosis: Other abnormalities of gait and mobility (R26.89);Pain Pain - Right/Left: Right Pain - part of body: Knee     Time: 1350-1415 PT Time Calculation (min) (ACUTE ONLY): 25 min  Charges:  $Gait Training: 8-22 mins $Therapeutic Activity: 8-22 mins                    G Codes:       Reinaldo Berber, PT, DPT Acute Rehab Services Pager: [phone]     Reinaldo Berber 01/13/2018, 2:20 PM

## 2018-01-13 NOTE — Anesthesia Postprocedure Evaluation (Signed)
Anesthesia Post Note  Patient: Sarah Randall  Procedure(s) Performed: RIGHT TOTAL KNEE ARTHROPLASTY (Right Knee)     Patient location during evaluation: PACU Anesthesia Type: General Level of consciousness: awake and alert Pain management: pain level controlled Vital Signs Assessment: post-procedure vital signs reviewed and stable Respiratory status: spontaneous breathing, nonlabored ventilation, respiratory function stable and patient connected to nasal cannula oxygen Cardiovascular status: blood pressure returned to baseline and stable Postop Assessment: no apparent nausea or vomiting Anesthetic complications: no    Last Vitals:  Vitals:   01/13/18 0045 01/13/18 0604  BP: 105/74 110/70  Pulse: 70 72  Resp: 16 16  Temp: 36.8 C 36.7 C  SpO2: 100% 100%    Last Pain:  Vitals:   01/13/18 0604  TempSrc: Oral  PainSc:                  [place] S

## 2018-01-13 NOTE — Progress Notes (Signed)
Subjective: 1 Day Post-Op Procedure(s) (LRB): RIGHT TOTAL KNEE ARTHROPLASTY (Right) Patient reports pain as moderate.  Doing well this am.  Objective: Vital signs in last 24 hours: Temp:  [97.2 F (36.2 C)-98.6 F (37 C)] 98 F (36.7 C) (02/07 0604) Pulse Rate:  [70-98] 72 (02/07 0604) Resp:  [9-18] 16 (02/07 0604) BP: (103-135)/(60-82) 110/70 (02/07 0604) SpO2:  [89 %-100 %] 100 % (02/07 0604) Weight:  [268 lb (121.6 kg)] 268 lb (121.6 kg) (02/06 1513)  Intake/Output from previous day: 02/06 0701 - 02/07 0700 In: 3480 [P.O.:480; I.V.:2500; IV Piggyback:200] Out: 1500 [Urine:1400; Blood:100] Intake/Output this shift: No intake/output data recorded.  No results for input(s): HGB in the last 72 hours. No results for input(s): WBC, RBC, HCT, PLT in the last 72 hours. No results for input(s): NA, K, CL, CO2, BUN, CREATININE, GLUCOSE, CALCIUM in the last 72 hours. No results for input(s): LABPT, INR in the last 72 hours.  Neurologically intact Neurovascular intact Sensation intact distally Intact pulses distally Dorsiflexion/Plantar flexion intact Incision: dressing C/D/I No cellulitis present Compartment soft  Assessment/Plan: 1 Day Post-Op Procedure(s) (LRB): RIGHT TOTAL KNEE ARTHROPLASTY (Right) Advance diet Up with therapy D/C IV fluids Plan for discharge tomorrow to home with hhpt (will be going to mother's house) WBAT RLE Please remove ace bandage and apply ted hose to [place] 01/13/2018, 7:53 AM

## 2018-01-13 NOTE — Progress Notes (Signed)
Physical Therapy Treatment Patient Details Name: Sarah Randall MRN: [id number] DOB: 12/23/1965 Today's Date: 01/13/2018    History of Present Illness Pt is a 52 y/o female s/p elective R TKA. PMH includes HTN, pre-diabetes, OSA, and L THA.     PT Comments    Patient progressing with therapy. Able to ambulate short distances in hallway this session, with poor mechanics and slow gait speed due to pain. Overall, good balance and progressed to close supervision level. Plan to introduce stair training next visit.    Follow Up Recommendations  DC plan and follow up therapy as arranged by surgeon;Supervision for mobility/OOB     Equipment Recommendations  None recommended by PT    Recommendations for Other Services       Precautions / Restrictions Precautions Precautions: Knee Precaution Booklet Issued: Yes (comment) Precaution Comments: Reviewed supine ther ex with pt. Limited tolerance secondary to pain.  Restrictions Weight Bearing Restrictions: Yes RLE Weight Bearing: Weight bearing as tolerated    Mobility  Bed Mobility               General bed mobility comments: OOB at entry  Transfers Overall transfer level: Needs assistance Equipment used: Rolling walker (2 wheeled) Transfers: Sit to/from Stand Sit to Stand: Min guard         General transfer comment: Min gaurd for safety, patient able to sit to stand x3 without help powering up.   Ambulation/Gait Ambulation/Gait assistance: Min guard;Supervision Ambulation Distance (Feet): 75 Feet Assistive device: Rolling walker (2 wheeled) Gait Pattern/deviations: Step-to pattern;Antalgic;Decreased step length - left;Decreased stance time - right Gait velocity: decreased   General Gait Details: Patient with extremly slow gait, short step lengths due to L hip pain and post op pain on RLE. patient without rest breaks for short distances, able to progress to close supervision this session. Cues for step length and heel  strike patient unable to demo.   Stairs            Wheelchair Mobility    Modified Rankin (Stroke Patients Only)       Balance Overall balance assessment: Needs assistance Sitting-balance support: No upper extremity supported;Feet supported Sitting balance-Leahy Scale: Good     Standing balance support: Bilateral upper extremity supported;During functional activity Standing balance-Leahy Scale: Fair Standing balance comment: relient on RW for dynamic mobility, able to balance static without.                            Cognition Arousal/Alertness: Awake/alert Behavior During Therapy: WFL for tasks assessed/performed Overall Cognitive Status: Within Functional Limits for tasks assessed                                        Exercises      General Comments General comments (skin integrity, edema, etc.): no family present. reviewed therex with patient reports no questions.       Pertinent Vitals/Pain Pain Assessment: 0-10 Pain Score: 5  Pain Location: R knee  Pain Descriptors / Indicators: Aching;Operative site guarding    Home Living                      Prior Function            PT Goals (current goals can now be found in the care plan section) Acute Rehab PT Goals Patient  Stated Goal: to decrease pain  PT Goal Formulation: With patient Time For Goal Achievement: 01/26/18 Potential to Achieve Goals: Good    Frequency    7X/week      PT Plan Current plan remains appropriate    Co-evaluation              AM-PAC PT "6 Clicks" Daily Activity  Outcome Measure  Difficulty turning over in bed (including adjusting bedclothes, sheets and blankets)?: A Little Difficulty moving from lying on back to sitting on the side of the bed? : Unable Difficulty sitting down on and standing up from a chair with arms (e.g., wheelchair, bedside commode, etc,.)?: Unable Help needed moving to and from a bed to chair  (including a wheelchair)?: A Little Help needed walking in hospital room?: A Little Help needed climbing 3-5 steps with a railing? : A Lot 6 Click Score: 13    End of Session Equipment Utilized During Treatment: Gait belt;Right knee immobilizer Activity Tolerance: Patient tolerated treatment well Patient left: in chair;with call bell/phone within reach;with family/visitor present Nurse Communication: Mobility status PT Visit Diagnosis: Other abnormalities of gait and mobility (R26.89);Pain Pain - Right/Left: Right Pain - part of body: Knee     Time: 4827-0786 PT Time Calculation (min) (ACUTE ONLY): 31 min  Charges:  $Gait Training: 8-22 mins $Therapeutic Activity: 8-22 mins                    G Codes:       Reinaldo Berber, PT, DPT Acute Rehab Services Pager: [phone]    Reinaldo Berber 01/13/2018, 8:56 AM

## 2018-01-13 NOTE — Care Management Note (Signed)
Case Management Note  Patient Details  Name: MISAO FACKRELL MRN: [id number] Date of Birth: [birth year]  Subjective/Objective:  52 yr old female s/p right total knee arthroplasty.                  Action/Plan: Case manager spoke with patient concerning discharge plan. Choice for Home Health agency was discussed. Referral was called to Neoma Laming, [facility] Liaison. Patient requested the same therapist she had in October, [place] relayed this request. She has RW and 3in1 at home and will have family support at [employer]. Patient is going to her mom's residence to recover: [address] [place], [place]. Her cell# is [phone].    Expected Discharge Date:  01/14/18               Expected Discharge Plan:  [place]  In-House Referral:     Discharge planning Services  CM Consult  Post Acute Care Choice:  Home Health Choice offered to:  Patient  DME Arranged:  (has RW and 3in1) DME Agency:  NA  HH Arranged:  PT [place] Agency:  [facility] [place]  Status of Service:  Completed, signed off  If discussed at [place] of Stay Meetings, dates discussed:    Additional Comments:  Ninfa Meeker, RN 01/13/2018, 10:15 AM

## 2018-01-14 LAB — GLUCOSE, CAPILLARY
Glucose-Capillary: 133 mg/dL — ABNORMAL HIGH (ref 65–99)
Glucose-Capillary: 75 mg/dL (ref 65–99)

## 2018-01-14 NOTE — Progress Notes (Signed)
Subjective: 2 Days Post-Op Procedure(s) (LRB): RIGHT TOTAL KNEE ARTHROPLASTY (Right) Patient reports pain as moderate.  Doing well this am.   Objective: Vital signs in last 24 hours: Temp:  [98.1 F (36.7 C)-98.3 F (36.8 C)] 98.1 F (36.7 C) (02/08 0530) Pulse Rate:  [65-73] 65 (02/08 0530) Resp:  [17-18] 18 (02/08 0530) BP: (116-136)/(67-74) 116/70 (02/08 0530) SpO2:  [100 %] 100 % (02/08 0530)  Intake/Output from previous day: 02/07 0701 - 02/08 0700 In: 1200 [P.O.:1200] Out: -  Intake/Output this shift: No intake/output data recorded.  Recent Labs    01/13/18 0658  HGB 13.9   Recent Labs    01/13/18 0658  WBC 19.6*  RBC 4.54  HCT 41.5  PLT 322   Recent Labs    01/13/18 0957  NA 131*  K 3.4*  CL 97*  CO2 20*  BUN 20  CREATININE 1.11*  GLUCOSE 130*  CALCIUM 8.3*   No results for input(s): LABPT, INR in the last 72 hours.  Neurologically intact Neurovascular intact Sensation intact distally Intact pulses distally Dorsiflexion/Plantar flexion intact Incision: dressing C/D/I No cellulitis present Compartment soft  Assessment/Plan: 2 Days Post-Op Procedure(s) (LRB): RIGHT TOTAL KNEE ARTHROPLASTY (Right) Advance diet Up with therapy Discharge home with home health after first or second session of PT (leaving up to how patient feels) WBAT RLE PLEASE REMOVE ACE BANDAGE AND APPLY TED HOSE TO RLE (WILL PUT SECOND ORDER IN FOR TED HOSE) Dry dressing change prn  Aundra Dubin 01/14/2018, 7:43 AM

## 2018-01-14 NOTE — Progress Notes (Signed)
Occupational Therapy Treatment Patient Details Name: Sarah Randall MRN: [id number] DOB: 12-29-1965 Today's Date: 01/14/2018    History of present illness Pt is a 52 y/o female s/p elective R TKA. PMH includes HTN, pre-diabetes, OSA, and L THA.    OT comments  Pt making good progress toward OT goals. Educated on use of AE for LB ADL; pt able to return demo with supervision. Provided with AE for home use. D/c plan remains appropriate. Will continue to follow acutely.   Follow Up Recommendations  DC plan and follow up therapy as arranged by surgeon;Supervision - Intermittent    Equipment Recommendations  None recommended by OT    Recommendations for Other Services      Precautions / Restrictions Precautions Precautions: Knee Precaution Booklet Issued: No Precaution Comments: reviewed precautions Restrictions Weight Bearing Restrictions: Yes RLE Weight Bearing: Weight bearing as tolerated       Mobility Bed Mobility      General bed mobility comments: Pt OOB upon arrival  Transfers Overall transfer level: Needs assistance Equipment used: Rolling walker (2 wheeled) Transfers: Sit to/from Stand Sit to Stand: Supervision Stand pivot transfers: Min guard       General transfer comment: for safety    Balance Overall balance assessment: Needs assistance Sitting-balance support: Feet supported;No upper extremity supported Sitting balance-Leahy Scale: Good     Standing balance support: No upper extremity supported;During functional activity Standing balance-Leahy Scale: Fair Standing balance comment: for static standing                           ADL either performed or assessed with clinical judgement   ADL Overall ADL's : Needs assistance/impaired     Grooming: Supervision/safety;Standing;Wash/dry hands   Upper Body Bathing: Set up;Sitting   Lower Body Bathing: Supervison/ safety;Sit to/from stand Lower Body Bathing Details (indicate cue type and  reason): Educated on use of long handled sponge Upper Body Dressing : Set up;Sitting   Lower Body Dressing: Supervision/safety;Sit to/from stand;With adaptive equipment Lower Body Dressing Details (indicate cue type and reason): Educated on use of reacher, sock aide, and long handled shoe horn; pt able to return demo use with supervision for safety Toilet Transfer: Supervision/safety;Ambulation;BSC;RW   Toileting- Clothing Manipulation and Hygiene: Modified independent;Sit to/from stand       Functional mobility during ADLs: Supervision/safety;Rolling walker General ADL Comments: Provided pt with necessary AE for home use     Vision       Perception     Praxis      Cognition Arousal/Alertness: Awake/alert Behavior During Therapy: WFL for tasks assessed/performed Overall Cognitive Status: Within Functional Limits for tasks assessed                                          Exercises     Shoulder Instructions       General Comments      Pertinent Vitals/ Pain       Pain Assessment: Faces Faces Pain Scale: Hurts little more Pain Location: R knee Pain Descriptors / Indicators: Aching;Sore Pain Intervention(s): Monitored during session;Repositioned  Home Living                                          Prior Functioning/Environment  Frequency  Min 2X/week        Progress Toward Goals  OT Goals(current goals can now be found in the care plan section)  Progress towards OT goals: Progressing toward goals  Acute Rehab OT Goals Patient Stated Goal: go home today OT Goal Formulation: With patient  Plan Discharge plan remains appropriate    Co-evaluation                 AM-PAC PT "6 Clicks" Daily Activity     Outcome Measure   Help from another person eating meals?: None Help from another person taking care of personal grooming?: None Help from another person toileting, which includes using toliet,  bedpan, or urinal?: A Little Help from another person bathing (including washing, rinsing, drying)?: A Little Help from another person to put on and taking off regular upper body clothing?: None Help from another person to put on and taking off regular lower body clothing?: A Little 6 Click Score: 21    End of Session Equipment Utilized During Treatment: Rolling walker;Right knee immobilizer  OT Visit Diagnosis: Unsteadiness on feet (R26.81);Pain Pain - Right/Left: Right Pain - part of body: Knee   Activity Tolerance Patient tolerated treatment well   Patient Left in chair;with call bell/phone within reach   Nurse Communication Other (comment)(knee dressing peeling off)        Time: 1829-9371 OT Time Calculation (min): 24 min  Charges: OT General Charges $OT Visit: 1 Visit OT Treatments $Self Care/Home Management : 23-37 mins  Kennet Mccort A. Ulice Brilliant, M.S., OTR/L Pager: [place] 01/14/2018, 10:53 AM

## 2018-01-14 NOTE — Discharge Summary (Signed)
Patient ID: Sarah Randall MRN: [id number] DOB/AGE: 1966/06/10 52 y.o.  Admit date: 01/12/2018 Discharge date: 01/14/2018  Admission Diagnoses:  Active Problems:   Total knee replacement status   Discharge Diagnoses:  Same  Past Medical History:  Diagnosis Date  . Arthritis   . Depression   . GERD (gastroesophageal reflux disease)    occasionally takes OTC  . Hypertension   . Pre-diabetes   . Sleep apnea    tested 2014 - unable to tolerate machine    Surgeries: Procedure(s): RIGHT TOTAL KNEE ARTHROPLASTY on 01/12/2018   Consultants:   Discharged Condition: Improved  Hospital Course: Sarah Randall is an 52 y.o. female who was admitted 01/12/2018 for operative treatment of primary localized osteoarthritis right knee. Patient has severe unremitting pain that affects sleep, daily activities, and work/hobbies. After pre-op clearance the patient was taken to the operating room on 01/12/2018 and underwent  Procedure(s): RIGHT TOTAL KNEE ARTHROPLASTY.    Patient was given perioperative antibiotics:  Anti-infectives (From admission, onward)   Start     Dose/Rate Route Frequency Ordered Stop   01/12/18 1700  ceFAZolin (ANCEF) IVPB 2g/100 mL premix     2 g 200 mL/hr over 30 Minutes Intravenous Every 8 hours 01/12/18 1340 01/13/18 1659   01/12/18 0915  ceFAZolin (ANCEF) 3 g in dextrose 5 % 50 mL IVPB     3 g 130 mL/hr over 30 Minutes Intravenous To [facility] 01/11/18 1021 01/12/18 0841   01/12/18 0000  sulfamethoxazole-trimethoprim (BACTRIM DS,SEPTRA DS) 800-160 MG tablet     1 tablet Oral 2 times daily 01/12/18 1017     01/11/18 1011  ceFAZolin (ANCEF) IVPB 2g/100 mL premix  Status:  Discontinued     2 g 200 mL/hr over 30 Minutes Intravenous On call to O.R. 01/11/18 1011 01/11/18 1020       Patient was given sequential compression devices, early ambulation, and chemoprophylaxis to prevent DVT.  Patient benefited maximally from hospital stay and there were no  complications.    Recent vital signs:  Patient Vitals for the past 24 hrs:  BP Temp Temp src Pulse Resp SpO2  01/14/18 0530 116/70 98.1 F (36.7 C) Oral 65 18 100 %  01/13/18 2145 136/67 98.3 F (36.8 C) Oral 73 18 100 %  01/13/18 1300 126/74 98.2 F (36.8 C) Oral 73 17 100 %     Recent laboratory studies:  Recent Labs    01/13/18 0658 01/13/18 0957  WBC 19.6*  --   HGB 13.9  --   HCT 41.5  --   PLT 322  --   NA  --  131*  K  --  3.4*  CL  --  97*  CO2  --  20*  BUN  --  20  CREATININE  --  1.11*  GLUCOSE  --  130*  CALCIUM  --  8.3*     Discharge Medications:   Allergies as of 01/14/2018      Reactions   Lipitor [atorvastatin] Other (See Comments)   Stabbing pains in legs   Zoloft [sertraline Hcl] Other (See Comments)   tremors      Medication List    STOP taking these medications   HYDROcodone-acetaminophen 5-325 MG tablet Commonly known as:  NORCO   HYDROcodone-acetaminophen 7.5-325 MG tablet Commonly known as:  NORCO   meloxicam 15 MG tablet Commonly known as:  MOBIC   mupirocin ointment 2 % Commonly known as:  BACTROBAN   naproxen sodium 220 MG  tablet Commonly known as:  ALEVE   tiZANidine 4 MG tablet Commonly known as:  ZANAFLEX   traMADol 50 MG tablet Commonly known as:  ULTRAM     TAKE these medications   aspirin EC 325 MG tablet Take 1 tablet (325 mg total) by mouth 2 (two) times daily.   buPROPion 100 MG tablet Commonly known as:  WELLBUTRIN Take 1 tablet (100 mg total) by mouth 2 (two) times daily.   hydrochlorothiazide 25 MG tablet Commonly known as:  HYDRODIURIL Take 1 tablet (25 mg total) by mouth daily.   losartan 50 MG tablet Commonly known as:  COZAAR Take 1 tablet (50 mg total) by mouth daily.   methocarbamol 750 MG tablet Commonly known as:  ROBAXIN Take 1 tablet (750 mg total) by mouth 2 (two) times daily as needed for muscle spasms. What changed:  Another medication with the same name was added. Make sure you  understand how and when to take each.   methocarbamol 750 MG tablet Commonly known as:  ROBAXIN Take 1 tablet (750 mg total) by mouth 2 (two) times daily as needed for muscle spasms. What changed:  You were already taking a medication with the same name, and this prescription was added. Make sure you understand how and when to take each.   ondansetron 4 MG tablet Commonly known as:  ZOFRAN Take 1-2 tablets (4-8 mg total) by mouth every 8 (eight) hours as needed for nausea or vomiting. What changed:  Another medication with the same name was added. Make sure you understand how and when to take each.   ondansetron 4 MG tablet Commonly known as:  ZOFRAN Take 1-2 tablets (4-8 mg total) by mouth every 8 (eight) hours as needed for nausea or vomiting. What changed:  You were already taking a medication with the same name, and this prescription was added. Make sure you understand how and when to take each.   oxyCODONE 10 mg 12 hr tablet Commonly known as:  OXYCONTIN Take 1 tablet (10 mg total) by mouth every 12 (twelve) hours. What changed:  Another medication with the same name was added. Make sure you understand how and when to take each.   oxyCODONE 5 MG immediate release tablet Commonly known as:  Oxy IR/ROXICODONE Take 1-3 tablets (5-15 mg total) by mouth every 4 (four) hours as needed. What changed:  Another medication with the same name was added. Make sure you understand how and when to take each.   oxyCODONE 10 mg 12 hr tablet Commonly known as:  OXYCONTIN Take 1 tablet (10 mg total) by mouth every 12 (twelve) hours. What changed:  You were already taking a medication with the same name, and this prescription was added. Make sure you understand how and when to take each.   oxyCODONE 5 MG immediate release tablet Commonly known as:  Oxy IR/ROXICODONE Take 1-3 tablets (5-15 mg total) by mouth every 4 (four) hours as needed. What changed:  You were already taking a medication with  the same name, and this prescription was added. Make sure you understand how and when to take each.   pravastatin 20 MG tablet Commonly known as:  PRAVACHOL 1 tab Q Mon/Wed and Fri What changed:    how much to take  how to take this  when to take this  additional instructions   promethazine 25 MG tablet Commonly known as:  PHENERGAN Take 1 tablet (25 mg total) by mouth every 6 (six) hours as needed for  nausea. What changed:  Another medication with the same name was added. Make sure you understand how and when to take each.   promethazine 25 MG tablet Commonly known as:  PHENERGAN Take 1 tablet (25 mg total) by mouth every 6 (six) hours as needed for nausea. What changed:  You were already taking a medication with the same name, and this prescription was added. Make sure you understand how and when to take each.   senna-docusate 8.6-50 MG tablet Commonly known as:  SENOKOT S Take 1 tablet by mouth at bedtime as needed. What changed:  Another medication with the same name was added. Make sure you understand how and when to take each.   senna-docusate 8.6-50 MG tablet Commonly known as:  SENOKOT S Take 1 tablet by mouth at bedtime as needed. What changed:  You were already taking a medication with the same name, and this prescription was added. Make sure you understand how and when to take each.   sulfamethoxazole-trimethoprim 800-160 MG tablet Commonly known as:  BACTRIM DS,SEPTRA DS Take 1 tablet by mouth 2 (two) times daily.            Durable Medical Equipment  (From admission, onward)        Start     Ordered   01/12/18 1340  DME Walker rolling  Once    Question:  Patient needs a walker to treat with the following condition  Answer:  Total knee replacement status   01/12/18 1340   01/12/18 1340  DME 3 n 1  Once     01/12/18 1340   01/12/18 1340  DME Bedside commode  Once    Question:  Patient needs a bedside commode to treat with the following condition   Answer:  Total knee replacement status   01/12/18 1340      Diagnostic Studies: Dg Knee Right Port  Result Date: 01/12/2018 CLINICAL DATA:  Right knee replacement. EXAM: PORTABLE RIGHT KNEE - 1-2 VIEW COMPARISON:  Two-view the radiographs 03/24/2016 FINDINGS: Right total knee arthroplasty has been performed. Femoral and tibial components are well seated. The knee is located. Disc spaces maintained. Fluid and gas are present in the joint, as expected. IMPRESSION: Status post right total knee arthroplasty without radiographic evidence for complication. Electronically Signed   By: San Morelle M.D.   On: 01/12/2018 11:29   Xr Knee 3 View Right  Result Date: 12/20/2017 Advanced DJD with varus alignment   Disposition: 01-Home or Self Care    Follow-up Information    Leandrew Koyanagi, MD Follow up in 2 week(s).   Specialty:  Orthopedic Surgery Why:  For suture removal, For wound re-check Contact information: [place] [address] [phone]        [facility] Follow up.   Specialty:  [place] Why:  A representative from [facility] will contact you to arrange start date and time for your therapy. Contact information: [address] [place] [address] [phone]            Signed: Aundra Dubin 01/14/2018, 7:45 AM

## 2018-01-14 NOTE — Progress Notes (Signed)
Sarah Randall to be D/C'd Home per MD order.  Discussed prescriptions and follow up appointments with the patient. Prescriptions given to patient, medication list explained in detail. Pt verbalized understanding.  Allergies as of 01/14/2018      Reactions   Lipitor [atorvastatin] Other (See Comments)   Stabbing pains in legs   Zoloft [sertraline Hcl] Other (See Comments)   tremors      Medication List    STOP taking these medications   HYDROcodone-acetaminophen 5-325 MG tablet Commonly known as:  NORCO   HYDROcodone-acetaminophen 7.5-325 MG tablet Commonly known as:  NORCO   meloxicam 15 MG tablet Commonly known as:  MOBIC   mupirocin ointment 2 % Commonly known as:  BACTROBAN   naproxen sodium 220 MG tablet Commonly known as:  ALEVE   tiZANidine 4 MG tablet Commonly known as:  ZANAFLEX   traMADol 50 MG tablet Commonly known as:  ULTRAM     TAKE these medications   aspirin EC 325 MG tablet Take 1 tablet (325 mg total) by mouth 2 (two) times daily.   buPROPion 100 MG tablet Commonly known as:  WELLBUTRIN Take 1 tablet (100 mg total) by mouth 2 (two) times daily.   hydrochlorothiazide 25 MG tablet Commonly known as:  HYDRODIURIL Take 1 tablet (25 mg total) by mouth daily.   losartan 50 MG tablet Commonly known as:  COZAAR Take 1 tablet (50 mg total) by mouth daily.   methocarbamol 750 MG tablet Commonly known as:  ROBAXIN Take 1 tablet (750 mg total) by mouth 2 (two) times daily as needed for muscle spasms. What changed:  Another medication with the same name was added. Make sure you understand how and when to take each.   methocarbamol 750 MG tablet Commonly known as:  ROBAXIN Take 1 tablet (750 mg total) by mouth 2 (two) times daily as needed for muscle spasms. What changed:  You were already taking a medication with the same name, and this prescription was added. Make sure you understand how and when to take each.   ondansetron 4 MG tablet Commonly known  as:  ZOFRAN Take 1-2 tablets (4-8 mg total) by mouth every 8 (eight) hours as needed for nausea or vomiting. What changed:  Another medication with the same name was added. Make sure you understand how and when to take each.   ondansetron 4 MG tablet Commonly known as:  ZOFRAN Take 1-2 tablets (4-8 mg total) by mouth every 8 (eight) hours as needed for nausea or vomiting. What changed:  You were already taking a medication with the same name, and this prescription was added. Make sure you understand how and when to take each.   oxyCODONE 10 mg 12 hr tablet Commonly known as:  OXYCONTIN Take 1 tablet (10 mg total) by mouth every 12 (twelve) hours. What changed:  Another medication with the same name was added. Make sure you understand how and when to take each.   oxyCODONE 5 MG immediate release tablet Commonly known as:  Oxy IR/ROXICODONE Take 1-3 tablets (5-15 mg total) by mouth every 4 (four) hours as needed. What changed:  Another medication with the same name was added. Make sure you understand how and when to take each.   oxyCODONE 10 mg 12 hr tablet Commonly known as:  OXYCONTIN Take 1 tablet (10 mg total) by mouth every 12 (twelve) hours. What changed:  You were already taking a medication with the same name, and this prescription was added. Make  sure you understand how and when to take each.   oxyCODONE 5 MG immediate release tablet Commonly known as:  Oxy IR/ROXICODONE Take 1-3 tablets (5-15 mg total) by mouth every 4 (four) hours as needed. What changed:  You were already taking a medication with the same name, and this prescription was added. Make sure you understand how and when to take each.   pravastatin 20 MG tablet Commonly known as:  PRAVACHOL 1 tab Q Mon/Wed and Fri What changed:    how much to take  how to take this  when to take this  additional instructions   promethazine 25 MG tablet Commonly known as:  PHENERGAN Take 1 tablet (25 mg total) by mouth  every 6 (six) hours as needed for nausea. What changed:  Another medication with the same name was added. Make sure you understand how and when to take each.   promethazine 25 MG tablet Commonly known as:  PHENERGAN Take 1 tablet (25 mg total) by mouth every 6 (six) hours as needed for nausea. What changed:  You were already taking a medication with the same name, and this prescription was added. Make sure you understand how and when to take each.   senna-docusate 8.6-50 MG tablet Commonly known as:  SENOKOT S Take 1 tablet by mouth at bedtime as needed. What changed:  Another medication with the same name was added. Make sure you understand how and when to take each.   senna-docusate 8.6-50 MG tablet Commonly known as:  SENOKOT S Take 1 tablet by mouth at bedtime as needed. What changed:  You were already taking a medication with the same name, and this prescription was added. Make sure you understand how and when to take each.   sulfamethoxazole-trimethoprim 800-160 MG tablet Commonly known as:  BACTRIM DS,SEPTRA DS Take 1 tablet by mouth 2 (two) times daily.            Durable Medical Equipment  (From admission, onward)        Start     Ordered   01/12/18 1340  DME Walker rolling  Once    Question:  Patient needs a walker to treat with the following condition  Answer:  Total knee replacement status   01/12/18 1340   01/12/18 1340  DME 3 n 1  Once     01/12/18 1340   01/12/18 1340  DME Bedside commode  Once    Question:  Patient needs a bedside commode to treat with the following condition  Answer:  Total knee replacement status   01/12/18 1340      Vitals:   01/13/18 2145 01/14/18 0530  BP: 136/67 116/70  Pulse: 73 65  Resp: 18 18  Temp: 98.3 F (36.8 C) 98.1 F (36.7 C)  SpO2: 100% 100%    Skin clean, dry and intact without evidence of skin break down, no evidence of skin tears noted. Aquacel dressing clean, dry, and intact. TED hose applied. IV catheter  discontinued intact. Site without signs and symptoms of complications. Dressing and pressure applied. Pt denies pain at this time. No complaints noted.  An After Visit Summary and prescriptions were printed and given to the patient. Patient escorted via [place], and D/C home via private auto.  [place] RN

## 2018-01-14 NOTE — Progress Notes (Signed)
Physical Therapy Treatment and Discharge Patient Details Name: Sarah Randall MRN: [id number] DOB: 1966-08-04 Today's Date: 01/14/2018    History of Present Illness Pt is a 52 y/o female s/p elective R TKA. PMH includes HTN, pre-diabetes, OSA, and L THA.     PT Comments    Patient has done well with therapy. Now supervision level for all mobility including stairs. Pt educated on safety considerations for home and has no further questions or concerns at this time. Pt has met all functional goals and will benefit from skilled home health PT when medically cleared for d/c.      Follow Up Recommendations  DC plan and follow up therapy as arranged by surgeon;Supervision for mobility/OOB     Equipment Recommendations  None recommended by PT    Recommendations for Other Services       Precautions / Restrictions Precautions Precautions: Knee Precaution Booklet Issued: Yes (comment) Precaution Comments: reviewed precautions Restrictions Weight Bearing Restrictions: Yes RLE Weight Bearing: Weight bearing as tolerated    Mobility  Bed Mobility Overal bed mobility: Needs Assistance Bed Mobility: Sit to Supine;Supine to Sit     Supine to sit: Supervision     General bed mobility comments: supervision for safety  Transfers Overall transfer level: Needs assistance Equipment used: Rolling walker (2 wheeled) Transfers: Sit to/from Stand Sit to Stand: Supervision Stand pivot transfers: Min guard          Ambulation/Gait Ambulation/Gait assistance: Supervision   Assistive device: Rolling walker (2 wheeled) Gait Pattern/deviations: Step-to pattern;Antalgic;Decreased step length - left;Decreased stance time - right Gait velocity: decreased       Stairs Stairs: Yes   Stair Management: One rail Left Number of Stairs: 12 General stair comments: Patient in higher pain than yesterday, however able to ambulate stairs with close supervision. 1 left rail with BUE support at  times descending. discussed with patient need to have family member close by and educated on proper guarding and body position. patient verablizes understanding and says she will have support today.   Wheelchair Mobility    Modified Rankin (Stroke Patients Only)       Balance Overall balance assessment: Needs assistance Sitting-balance support: No upper extremity supported;Feet supported Sitting balance-Leahy Scale: Good     Standing balance support: Bilateral upper extremity supported;During functional activity Standing balance-Leahy Scale: Fair Standing balance comment: relient on RW for dynamic mobility, able to balance static without.                            Cognition Arousal/Alertness: Awake/alert Behavior During Therapy: WFL for tasks assessed/performed Overall Cognitive Status: Within Functional Limits for tasks assessed                                        Exercises      General Comments        Pertinent Vitals/Pain Pain Assessment: Faces Faces Pain Scale: Hurts even more Pain Location: R knee  Pain Descriptors / Indicators: Aching;Operative site guarding Pain Intervention(s): Limited activity within patient's tolerance;Monitored during session;Repositioned    Home Living                      Prior Function            PT Goals (current goals can now be found in the care plan section) Acute  Rehab PT Goals Patient Stated Goal: to decrease pain  PT Goal Formulation: With patient Time For Goal Achievement: 01/26/18 Potential to Achieve Goals: Good Progress towards PT goals: Progressing toward goals    Frequency           PT Plan Current plan remains appropriate    Co-evaluation              AM-PAC PT "6 Clicks" Daily Activity  Outcome Measure  Difficulty turning over in bed (including adjusting bedclothes, sheets and blankets)?: A Little Difficulty moving from lying on back to sitting on the side  of the bed? : A Little Difficulty sitting down on and standing up from a chair with arms (e.g., wheelchair, bedside commode, etc,.)?: A Little Help needed moving to and from a bed to chair (including a wheelchair)?: A Little Help needed walking in hospital room?: A Little Help needed climbing 3-5 steps with a railing? : A Little 6 Click Score: 18    End of Session Equipment Utilized During Treatment: Gait belt Activity Tolerance: Patient tolerated treatment well Patient left: with call bell/phone within reach;in bed Nurse Communication: Mobility status PT Visit Diagnosis: Other abnormalities of gait and mobility (R26.89);Pain Pain - Right/Left: Right Pain - part of body: Knee     Time: 0821-0839 PT Time Calculation (min) (ACUTE ONLY): 18 min  Charges:  $Gait Training: 8-22 mins                    G Codes:       Reinaldo Berber, PT, DPT Acute Rehab Services Pager: [phone]     Reinaldo Berber 01/14/2018, 8:46 AM

## 2018-01-19 ENCOUNTER — Telehealth (INDEPENDENT_AMBULATORY_CARE_PROVIDER_SITE_OTHER): Payer: Self-pay | Admitting: Orthopaedic Surgery

## 2018-01-19 NOTE — Telephone Encounter (Signed)
Please advise, can hold for Thursday, thanks.

## 2018-01-19 NOTE — Telephone Encounter (Signed)
Patient called asking for a refill on her oxycodone extended release. CB # [phone]

## 2018-01-20 ENCOUNTER — Telehealth (INDEPENDENT_AMBULATORY_CARE_PROVIDER_SITE_OTHER): Payer: Self-pay | Admitting: Orthopaedic Surgery

## 2018-01-20 ENCOUNTER — Other Ambulatory Visit (INDEPENDENT_AMBULATORY_CARE_PROVIDER_SITE_OTHER): Payer: Self-pay | Admitting: Radiology

## 2018-01-20 MED ORDER — OXYCODONE HCL 5 MG PO TABS: 5.0000 mg | ORAL_TABLET | Freq: Four times a day (QID) | ORAL | 0 refills | Status: DC | PRN

## 2018-01-20 NOTE — Telephone Encounter (Signed)
We can refill oxycodone (immediate release), but not oxycontin (which is the extended release)

## 2018-01-20 NOTE — Telephone Encounter (Signed)
Patient called concerning Rx refill. The number to contact patient is [phone]

## 2018-01-20 NOTE — Telephone Encounter (Signed)
IC patient and advised rx oxycodone immed release is at front desk.  Her father Sarah Randall will pick up the Rx, this is ok, he will bring his ID.

## 2018-01-21 ENCOUNTER — Telehealth (INDEPENDENT_AMBULATORY_CARE_PROVIDER_SITE_OTHER): Payer: Self-pay | Admitting: Orthopaedic Surgery

## 2018-01-21 NOTE — Telephone Encounter (Signed)
Beth -(PT) with [facility] called needing verbal orders to put compression stocking on patient's right leg as indicated by Dr. Erlinda Hong. The number to contact Eustaquio Maize is [phone]

## 2018-01-24 NOTE — Telephone Encounter (Signed)
Called Beth no answer LMOM okay per [place] on orders

## 2018-01-24 NOTE — Telephone Encounter (Signed)
Okay to do?

## 2018-01-24 NOTE — Telephone Encounter (Signed)
Ok to do

## 2018-01-27 ENCOUNTER — Telehealth (INDEPENDENT_AMBULATORY_CARE_PROVIDER_SITE_OTHER): Payer: Self-pay | Admitting: Orthopaedic Surgery

## 2018-01-27 ENCOUNTER — Other Ambulatory Visit (INDEPENDENT_AMBULATORY_CARE_PROVIDER_SITE_OTHER): Payer: Self-pay

## 2018-01-27 MED ORDER — OXYCODONE HCL 5 MG PO TABS: ORAL_TABLET | ORAL | 0 refills | Status: DC

## 2018-01-27 NOTE — Telephone Encounter (Signed)
#  30.  1-2 tabs po bid prn pain

## 2018-01-27 NOTE — Telephone Encounter (Signed)
Please advise 

## 2018-01-27 NOTE — Telephone Encounter (Signed)
Rx printed Pending signature.

## 2018-01-27 NOTE — Telephone Encounter (Signed)
yes

## 2018-01-27 NOTE — Telephone Encounter (Signed)
Ollen Gross is a Education officer, museum with [employer] and she needs verbal orders to have 1 visit for social work. She said the time ran out before they could get to patient. Call back # [phone] - ok to leave message with name of person giving orders as well as the doctors name.

## 2018-01-27 NOTE — Telephone Encounter (Signed)
Patient requesting RX refill on oxycodone, she said she will run out before her appt on Monday. # [phone]

## 2018-01-27 NOTE — Telephone Encounter (Signed)
See message below °

## 2018-01-28 NOTE — Telephone Encounter (Signed)
Called no answer LMOM approved orders per Dr Erlinda Hong

## 2018-01-28 NOTE — Telephone Encounter (Signed)
Called patient rx ready for pick up

## 2018-01-31 ENCOUNTER — Encounter (INDEPENDENT_AMBULATORY_CARE_PROVIDER_SITE_OTHER): Payer: Self-pay | Admitting: Orthopaedic Surgery

## 2018-01-31 ENCOUNTER — Ambulatory Visit (INDEPENDENT_AMBULATORY_CARE_PROVIDER_SITE_OTHER): Payer: Self-pay | Admitting: Orthopaedic Surgery

## 2018-01-31 ENCOUNTER — Ambulatory Visit (INDEPENDENT_AMBULATORY_CARE_PROVIDER_SITE_OTHER): Payer: Self-pay

## 2018-01-31 DIAGNOSIS — Z96651 Presence of right artificial knee joint: Secondary | ICD-10-CM

## 2018-01-31 NOTE — Progress Notes (Signed)
Post-Op Visit Note   Patient: Sarah Randall           Date of Birth: 09-28-1966           MRN: [id number] Visit Date: 01/31/2018 PCP: Ladell Pier, MD   Assessment & Plan:  Chief Complaint:  Chief Complaint  Patient presents with  . Right Knee - Routine Post Op   Visit Diagnoses:  1. Status post total right knee replacement     Plan: Kieth Brightly comes in for follow-up.  19 days status post right total knee replacement, date of surgery 01/12/2018.  She has been doing well.  Working well with outpatient physical therapy.  No fevers chills or any other systemic symptoms.  Supination of her right knee reveals a well-healing surgical incision without evidence of infection.  Calf soft nontender.  She is rest intact distally.  At this point we will go ahead and transition her to outpatient physical therapy.  A prescription was given for this for [facility].  She will follow-up with [place] in 4 weeks time for repeat evaluation and x-ray.  Follow-Up Instructions: Return in about 4 weeks (around 02/28/2018).   Orders:  Orders Placed This Encounter  Procedures  . XR Knee 1-2 Views Right   No orders of the defined types were placed in this encounter.   Imaging: Xr Knee 1-2 Views Right  Result Date: 01/31/2018 X-rays reveal stable alignment of the prosthesis.   PMFS History: Patient Active Problem List   Diagnosis Date Noted  . Status post right unicompartmental knee replacement 01/12/2018  . History of hip replacement 09/22/2017  . Hyperlipidemia 08/10/2017  . Primary osteoarthritis of left hip 06/25/2017  . Primary osteoarthritis of right knee 06/25/2017  . Depression 06/25/2017  . Essential hypertension 06/25/2017  . Class 3 severe obesity due to excess calories without serious comorbidity with body mass index (BMI) of 45.0 to 49.9 in adult ([place]) 06/25/2017  . Tobacco abuse 06/25/2017  . Prediabetes 06/25/2017  . OSA on CPAP 06/25/2017   Past Medical History:    Diagnosis Date  . Arthritis   . Depression   . GERD (gastroesophageal reflux disease)    occasionally takes OTC  . Hypertension   . Pre-diabetes   . Sleep apnea    tested 2014 - unable to tolerate machine    Family History  Problem Relation Age of Onset  . Hypertension Mother   . Diabetes Mother   . Hypertension Father   . Breast cancer Maternal Aunt   . Lung cancer Maternal Aunt     Past Surgical History:  Procedure Laterality Date  . TOTAL HIP ARTHROPLASTY Left 09/22/2017   Procedure: LEFT TOTAL HIP ARTHROPLASTY ANTERIOR APPROACH;  Surgeon: Leandrew Koyanagi, MD;  Location: [facility];  Service: Orthopedics;  Laterality: Left;  . TOTAL KNEE ARTHROPLASTY Right 01/12/2018   Procedure: RIGHT TOTAL KNEE ARTHROPLASTY;  Surgeon: Leandrew Koyanagi, MD;  Location: [facility];  Service: Orthopedics;  Laterality: Right;  . TUBAL LIGATION     Social History   Occupational History  . Occupation: unemployed  Tobacco Use  . Smoking status: Current Some Day Smoker    Packs/day: 0.25    Years: 30.00    Pack years: 7.50    Types: Cigarettes  . Smokeless tobacco: Never Used  Substance and Sexual Activity  . Alcohol use: Yes    Comment: occasional  . Drug use: No    Comment: smoke 1 joint cannibis 1 week ago.  Marland Kitchen  Sexual activity: Yes    Birth control/protection: None

## 2018-02-03 ENCOUNTER — Telehealth (INDEPENDENT_AMBULATORY_CARE_PROVIDER_SITE_OTHER): Payer: Self-pay | Admitting: Orthopaedic Surgery

## 2018-02-03 NOTE — Telephone Encounter (Signed)
Patient called requesting an RX refill on her Oxycodone.  CB#[phone].  Thank you.

## 2018-02-03 NOTE — Telephone Encounter (Signed)
Please advise 

## 2018-02-03 NOTE — Telephone Encounter (Signed)
Yes, please refer to [facility]

## 2018-02-03 NOTE — Telephone Encounter (Signed)
[place] [phone]    [facility] is discharging pt tomorrow. Princess would like to know if we are ok with pt starting out pt physical therapy.    Zacarias Pontes out pt [employer]  [phone] fax #

## 2018-02-04 ENCOUNTER — Other Ambulatory Visit (INDEPENDENT_AMBULATORY_CARE_PROVIDER_SITE_OTHER): Payer: Self-pay

## 2018-02-04 DIAGNOSIS — G8929 Other chronic pain: Secondary | ICD-10-CM

## 2018-02-04 DIAGNOSIS — M25561 Pain in right knee: Principal | ICD-10-CM

## 2018-02-04 MED ORDER — OXYCODONE HCL 5 MG PO TABS: ORAL_TABLET | ORAL | 0 refills | Status: DC

## 2018-02-04 NOTE — Telephone Encounter (Signed)
Rx pending signature.  

## 2018-02-04 NOTE — Telephone Encounter (Signed)
ORDER MADE THROUGH Epic

## 2018-02-04 NOTE — Telephone Encounter (Signed)
#  30.  1-2 tab daily prn

## 2018-02-04 NOTE — Telephone Encounter (Signed)
Please advise 

## 2018-02-07 ENCOUNTER — Telehealth (INDEPENDENT_AMBULATORY_CARE_PROVIDER_SITE_OTHER): Payer: Self-pay | Admitting: Orthopaedic Surgery

## 2018-02-07 NOTE — Telephone Encounter (Signed)
Patient called concerning Rx refill (Oxycodone) The number to contact patient is [phone]

## 2018-02-07 NOTE — Telephone Encounter (Signed)
Rx ready for pick up at the front desk. Patient aware. 

## 2018-02-07 NOTE — Telephone Encounter (Signed)
Sarah Randall [phone] Boyfriend.  Will come pick up Rx for patient   Advised patient this will be a one time thing.

## 2018-02-09 ENCOUNTER — Ambulatory Visit: Payer: Self-pay | Admitting: Physical Therapy

## 2018-02-14 ENCOUNTER — Other Ambulatory Visit: Payer: Self-pay | Admitting: Internal Medicine

## 2018-02-14 DIAGNOSIS — I10 Essential (primary) hypertension: Secondary | ICD-10-CM

## 2018-02-14 MED FILL — HYDROCHLOROTHIAZIDE 25 MG T: 25 | 30 days supply | Qty: 30 | Fill #5

## 2018-02-14 MED FILL — LOSARTAN POTASSIUM 50 MG TA: 50 | 30 days supply | Qty: 30 | Fill #6

## 2018-02-16 ENCOUNTER — Telehealth (INDEPENDENT_AMBULATORY_CARE_PROVIDER_SITE_OTHER): Payer: Self-pay | Admitting: Orthopaedic Surgery

## 2018-02-16 NOTE — Telephone Encounter (Signed)
Patient called asking for a refill on her oxycodone. CB # [phone]

## 2018-02-16 NOTE — Telephone Encounter (Signed)
Need to wean to norco.  1-2 tabs po bid prn pain.  [place]

## 2018-02-16 NOTE — Telephone Encounter (Signed)
Please advise 

## 2018-02-17 ENCOUNTER — Other Ambulatory Visit (INDEPENDENT_AMBULATORY_CARE_PROVIDER_SITE_OTHER): Payer: Self-pay

## 2018-02-17 MED ORDER — HYDROCODONE-ACETAMINOPHEN 5-325 MG PO TABS: ORAL_TABLET | ORAL | 0 refills | Status: DC

## 2018-02-17 NOTE — Telephone Encounter (Signed)
Called patient to advise. Rx  Ready for pick up

## 2018-02-21 ENCOUNTER — Ambulatory Visit: Payer: Self-pay | Attending: Internal Medicine

## 2018-02-28 ENCOUNTER — Encounter (INDEPENDENT_AMBULATORY_CARE_PROVIDER_SITE_OTHER): Payer: Self-pay | Admitting: Orthopaedic Surgery

## 2018-02-28 ENCOUNTER — Ambulatory Visit (INDEPENDENT_AMBULATORY_CARE_PROVIDER_SITE_OTHER): Payer: Self-pay | Admitting: Orthopaedic Surgery

## 2018-02-28 ENCOUNTER — Ambulatory Visit (INDEPENDENT_AMBULATORY_CARE_PROVIDER_SITE_OTHER): Payer: Self-pay

## 2018-02-28 DIAGNOSIS — Z96651 Presence of right artificial knee joint: Secondary | ICD-10-CM

## 2018-02-28 MED ORDER — HYDROCODONE-ACETAMINOPHEN 5-325 MG PO TABS: 1.0000 | ORAL_TABLET | Freq: Two times a day (BID) | ORAL | 0 refills | Status: DC | PRN

## 2018-02-28 NOTE — Progress Notes (Signed)
Post-Op Visit Note   Patient: Sarah Randall           Date of Birth: 08/29/1966           MRN: [id number] Visit Date: 02/28/2018 PCP: Ladell Pier, MD   Assessment & Plan:  Chief Complaint:  Chief Complaint  Patient presents with  . Right Knee - Pain, Follow-up   Visit Diagnoses:  1. S/P total knee arthroplasty, right     Plan: Candy comes in for follow-up.  47 days status post right total knee replacement, date of surgery 01/12/2018.  She has been doing excellent.  Still has some pain at night after she is been on her feet all day.  She has been [place] this on her own the entire postoperative period.  No fevers, chills or any other systemic symptoms.  Examination of her right knee reveals a well-healed surgical incision without evidence of infection.  Calf soft nontender.  Range of motion 0-125 degrees.  She is stable to valgus and varus stress.  She is rest intact distally.  X-rays today show a well-seated prosthesis.  She will continue working on her home exercise program and follow-up with [place] in 6 weeks time for repeat evaluation and x-ray.  Follow-Up Instructions: Return in about 6 weeks (around 04/11/2018).   Orders:  Orders Placed This Encounter  Procedures  . XR KNEE 3 VIEW RIGHT   Meds ordered this encounter  Medications  . HYDROcodone-acetaminophen (NORCO) 5-325 MG tablet    Sig: Take 1-2 tablets by mouth 2 (two) times daily as needed for moderate pain.    Dispense:  28 tablet    Refill:  0    Imaging: Xr Knee 3 View Right  Result Date: 02/28/2018 Stable total knee replacement   PMFS History: Patient Active Problem List   Diagnosis Date Noted  . S/P total knee arthroplasty, right 01/12/2018  . History of hip replacement 09/22/2017  . Hyperlipidemia 08/10/2017  . Primary osteoarthritis of left hip 06/25/2017  . Primary osteoarthritis of right knee 06/25/2017  . Depression 06/25/2017  . Essential hypertension 06/25/2017  . Class 3 severe obesity  due to excess calories without serious comorbidity with body mass index (BMI) of 45.0 to 49.9 in adult ([place]) 06/25/2017  . Tobacco abuse 06/25/2017  . Prediabetes 06/25/2017  . OSA on CPAP 06/25/2017   Past Medical History:  Diagnosis Date  . Arthritis   . Depression   . GERD (gastroesophageal reflux disease)    occasionally takes OTC  . Hypertension   . Pre-diabetes   . Sleep apnea    tested 2014 - unable to tolerate machine    Family History  Problem Relation Age of Onset  . Hypertension Mother   . Diabetes Mother   . Hypertension Father   . Breast cancer Maternal Aunt   . Lung cancer Maternal Aunt     Past Surgical History:  Procedure Laterality Date  . TOTAL HIP ARTHROPLASTY Left 09/22/2017   Procedure: LEFT TOTAL HIP ARTHROPLASTY ANTERIOR APPROACH;  Surgeon: Leandrew Koyanagi, MD;  Location: [facility];  Service: Orthopedics;  Laterality: Left;  . TOTAL KNEE ARTHROPLASTY Right 01/12/2018   Procedure: RIGHT TOTAL KNEE ARTHROPLASTY;  Surgeon: Leandrew Koyanagi, MD;  Location: [place] [facility];  Service: Orthopedics;  Laterality: Right;  . TUBAL LIGATION     Social History   Occupational History  . Occupation: unemployed  Tobacco Use  . Smoking status: Current Some Day Smoker    Packs/day: 0.25  Years: 30.00    Pack years: 7.50    Types: Cigarettes  . Smokeless tobacco: Never Used  Substance and Sexual Activity  . Alcohol use: Yes    Comment: occasional  . Drug use: No    Comment: smoke 1 joint cannibis 1 week ago.  Marland Kitchen Sexual activity: Yes    Birth control/protection: None

## 2018-03-21 ENCOUNTER — Other Ambulatory Visit: Payer: Self-pay | Admitting: Internal Medicine

## 2018-03-21 DIAGNOSIS — I10 Essential (primary) hypertension: Secondary | ICD-10-CM

## 2018-03-22 MED FILL — LOSARTAN POTASSIUM 50 MG TA: 50 | 30 days supply | Qty: 30 | Fill #0

## 2018-04-04 MED FILL — HYDROCHLOROTHIAZIDE 25 MG T: 25 | 30 days supply | Qty: 30 | Fill #6

## 2018-04-11 ENCOUNTER — Ambulatory Visit (INDEPENDENT_AMBULATORY_CARE_PROVIDER_SITE_OTHER): Payer: Self-pay

## 2018-04-11 ENCOUNTER — Encounter (INDEPENDENT_AMBULATORY_CARE_PROVIDER_SITE_OTHER): Payer: Self-pay | Admitting: Orthopaedic Surgery

## 2018-04-11 ENCOUNTER — Ambulatory Visit (INDEPENDENT_AMBULATORY_CARE_PROVIDER_SITE_OTHER): Payer: Self-pay | Admitting: Orthopaedic Surgery

## 2018-04-11 DIAGNOSIS — Z96651 Presence of right artificial knee joint: Secondary | ICD-10-CM

## 2018-04-11 MED ORDER — PREDNISONE 10 MG (21) PO TBPK: ORAL_TABLET | ORAL | 0 refills | Status: DC

## 2018-04-11 MED ORDER — TRAMADOL HCL 50 MG PO TABS: ORAL_TABLET | ORAL | 0 refills | Status: DC

## 2018-04-11 MED FILL — traMADol HCL 50 MG TABS: 50 | 7 days supply | Qty: 56 | Fill #0

## 2018-04-11 NOTE — Progress Notes (Signed)
Post-Op Visit Note   Patient: Sarah Randall           Date of Birth: 1966-04-06           MRN: [id number] Visit Date: 04/11/2018 PCP: Ladell Pier, MD   Assessment & Plan:  Chief Complaint:  Chief Complaint  Patient presents with  . Right Knee - Follow-up   Visit Diagnoses:  1. S/P total knee arthroplasty, right     Plan: Patient is a pleasant 52 year old female who presents to our clinic today 89 days status post right total knee replacement, date of surgery 01/12/2018.  She has been doing well in regards to regaining motion and strength.  She does still note a toothache type pain to the lateral aspect of the right knee worse at night.  She also notes burning to the left thigh.  Of note, she is status post left total hip replacement 09/22/2017.  She does note midline lower back pain that often causes her to sit down when she is up moving around.  Examination of the right knee shows a well-healed surgical incision without evidence of infection.  Stable to valgus and varus stress.  Positive straight leg raise both sides.  Negative logroll the left.  At this point, believe the majority of her symptoms are coming from her back.  I will call in a steroid taper.  She will follow-up with [place] in 3 months time for repeat evaluation x-ray of the right knee.  Call with concerns or questions in the meantime.  Follow-Up Instructions: Return in about 3 months (around 07/12/2018).   Orders:  Orders Placed This Encounter  Procedures  . XR KNEE 3 VIEW RIGHT  . XR Lumbar Spine 2-3 Views   Meds ordered this encounter  Medications  . predniSONE (STERAPRED UNI-PAK 21 TAB) 10 MG (21) TBPK tablet    Sig: Take as directed    Dispense:  21 tablet    Refill:  0    Imaging: Xr Knee 3 View Right  Result Date: 04/11/2018 X-rays show well-seated prosthesis  Xr Lumbar Spine 2-3 Views  Result Date: 04/11/2018 X-rays of the lumbar spine show degenerative disc disease at T11-12 and L4-5   PMFS  History: Patient Active Problem List   Diagnosis Date Noted  . S/P total knee arthroplasty, right 01/12/2018  . History of hip replacement 09/22/2017  . Hyperlipidemia 08/10/2017  . Primary osteoarthritis of left hip 06/25/2017  . Primary osteoarthritis of right knee 06/25/2017  . Depression 06/25/2017  . Essential hypertension 06/25/2017  . Class 3 severe obesity due to excess calories without serious comorbidity with body mass index (BMI) of 45.0 to 49.9 in adult ([place]) 06/25/2017  . Tobacco abuse 06/25/2017  . Prediabetes 06/25/2017  . OSA on CPAP 06/25/2017   Past Medical History:  Diagnosis Date  . Arthritis   . Depression   . GERD (gastroesophageal reflux disease)    occasionally takes OTC  . Hypertension   . Pre-diabetes   . Sleep apnea    tested 2014 - unable to tolerate machine    Family History  Problem Relation Age of Onset  . Hypertension Mother   . Diabetes Mother   . Hypertension Father   . Breast cancer Maternal Aunt   . Lung cancer Maternal Aunt     Past Surgical History:  Procedure Laterality Date  . TOTAL HIP ARTHROPLASTY Left 09/22/2017   Procedure: LEFT TOTAL HIP ARTHROPLASTY ANTERIOR APPROACH;  Surgeon: Leandrew Koyanagi, MD;  Location: [facility];  Service: Orthopedics;  Laterality: Left;  . TOTAL KNEE ARTHROPLASTY Right 01/12/2018   Procedure: RIGHT TOTAL KNEE ARTHROPLASTY;  Surgeon: Leandrew Koyanagi, MD;  Location: [facility];  Service: Orthopedics;  Laterality: Right;  . TUBAL LIGATION     Social History   Occupational History  . Occupation: unemployed  Tobacco Use  . Smoking status: Current Some Day Smoker    Packs/day: 0.25    Years: 30.00    Pack years: 7.50    Types: Cigarettes  . Smokeless tobacco: Never Used  Substance and Sexual Activity  . Alcohol use: Yes    Comment: occasional  . Drug use: No    Comment: smoke 1 joint cannibis 1 week ago.  Marland Kitchen Sexual activity: Yes    Birth control/protection: None

## 2018-04-12 MED FILL — predniSONE 10 MG TABS: 10 | 6 days supply | Qty: 21 | Fill #0

## 2018-04-27 MED FILL — LOSARTAN POTASSIUM 50 MG TA: 50 | 30 days supply | Qty: 30 | Fill #1

## 2018-04-27 MED FILL — HYDROCHLOROTHIAZIDE 25 MG T: 25 | 30 days supply | Qty: 30 | Fill #7

## 2018-05-16 ENCOUNTER — Other Ambulatory Visit (INDEPENDENT_AMBULATORY_CARE_PROVIDER_SITE_OTHER): Payer: Self-pay

## 2018-05-16 ENCOUNTER — Telehealth (INDEPENDENT_AMBULATORY_CARE_PROVIDER_SITE_OTHER): Payer: Self-pay | Admitting: Orthopaedic Surgery

## 2018-05-16 MED ORDER — TRAMADOL HCL 50 MG PO TABS: ORAL_TABLET | ORAL | 0 refills | Status: DC

## 2018-05-16 NOTE — Telephone Encounter (Signed)
Please advise 

## 2018-05-16 NOTE — Telephone Encounter (Signed)
Patient called asking for a refill on her tramadol. CB# [phone]

## 2018-05-16 NOTE — Telephone Encounter (Signed)
Called into pharmacy; patient aware  

## 2018-05-16 NOTE — Telephone Encounter (Signed)
#  20. 1 tab bid prn

## 2018-05-17 MED FILL — traMADol HCL 50 MG TABS: 50 | 10 days supply | Qty: 20 | Fill #0

## 2018-05-26 ENCOUNTER — Ambulatory Visit: Payer: Self-pay | Attending: Internal Medicine | Admitting: Internal Medicine

## 2018-05-26 ENCOUNTER — Encounter: Payer: Self-pay | Admitting: Internal Medicine

## 2018-05-26 VITALS — BP 129/84 | HR 97 | Temp 98.9°F | Resp 16 | Wt 266.8 lb

## 2018-05-26 DIAGNOSIS — M1712 Unilateral primary osteoarthritis, left knee: Secondary | ICD-10-CM | POA: Insufficient documentation

## 2018-05-26 DIAGNOSIS — F17210 Nicotine dependence, cigarettes, uncomplicated: Secondary | ICD-10-CM | POA: Insufficient documentation

## 2018-05-26 DIAGNOSIS — R7303 Prediabetes: Secondary | ICD-10-CM

## 2018-05-26 DIAGNOSIS — Z8249 Family history of ischemic heart disease and other diseases of the circulatory system: Secondary | ICD-10-CM | POA: Insufficient documentation

## 2018-05-26 DIAGNOSIS — I10 Essential (primary) hypertension: Secondary | ICD-10-CM

## 2018-05-26 DIAGNOSIS — K219 Gastro-esophageal reflux disease without esophagitis: Secondary | ICD-10-CM | POA: Insufficient documentation

## 2018-05-26 DIAGNOSIS — G4733 Obstructive sleep apnea (adult) (pediatric): Secondary | ICD-10-CM | POA: Insufficient documentation

## 2018-05-26 DIAGNOSIS — E785 Hyperlipidemia, unspecified: Secondary | ICD-10-CM | POA: Insufficient documentation

## 2018-05-26 DIAGNOSIS — Z7982 Long term (current) use of aspirin: Secondary | ICD-10-CM | POA: Insufficient documentation

## 2018-05-26 DIAGNOSIS — F329 Major depressive disorder, single episode, unspecified: Secondary | ICD-10-CM | POA: Insufficient documentation

## 2018-05-26 DIAGNOSIS — Z96642 Presence of left artificial hip joint: Secondary | ICD-10-CM | POA: Insufficient documentation

## 2018-05-26 DIAGNOSIS — Z6841 Body Mass Index (BMI) 40.0 and over, adult: Secondary | ICD-10-CM | POA: Insufficient documentation

## 2018-05-26 DIAGNOSIS — Z96651 Presence of right artificial knee joint: Secondary | ICD-10-CM | POA: Insufficient documentation

## 2018-05-26 DIAGNOSIS — R110 Nausea: Secondary | ICD-10-CM

## 2018-05-26 DIAGNOSIS — M1611 Unilateral primary osteoarthritis, right hip: Secondary | ICD-10-CM | POA: Insufficient documentation

## 2018-05-26 DIAGNOSIS — F1290 Cannabis use, unspecified, uncomplicated: Secondary | ICD-10-CM

## 2018-05-26 DIAGNOSIS — Z79899 Other long term (current) drug therapy: Secondary | ICD-10-CM | POA: Insufficient documentation

## 2018-05-26 MED ORDER — ONDANSETRON HCL 4 MG PO TABS: 4.0000 mg | ORAL_TABLET | Freq: Three times a day (TID) | ORAL | 0 refills | Status: DC | PRN

## 2018-05-26 MED ORDER — ESOMEPRAZOLE MAGNESIUM 40 MG PO CPDR: 40.0000 mg | DELAYED_RELEASE_CAPSULE | Freq: Every day | ORAL | 3 refills | Status: DC

## 2018-05-26 MED FILL — ESOMEPRAZOLE MAG DR 40 MG C: 40 | 30 days supply | Qty: 30 | Fill #0

## 2018-05-26 NOTE — Progress Notes (Signed)
Patient ID: Sarah Randall, female    DOB: [birth year]  MRN: [id number]  CC: Dizziness and Emesis   Subjective: Sarah Randall is a 52 y.o. female who presents for UC.  Last seen 08/2017 Her concerns today include:  Pt with hx of HTN,pre-DM, tob abuse, OSA has CPAP but not using, obesity,GERD, depression, obesity, DDDoflumbar spine, OA RT hip and LT knee s/p jt replacements  C/o having a lot of nausea in the last 2 mths. "I get an awful taste in my mouth and I try to thrown up"  by gagging herself.  No hematemesis Comes on suddenly.  No dysphasia, abdominal pain, HA,palpitations.  Little dizziness at time. No polyuria or polydipsia.    Associated with hot and cold chills Not necessarily associated with a meal.      Was on narcotics for a while post TKR 01/2018.  On further questioning she reports that the nausea had started pain and the orthopedic specialist placed on Zofran and Phenergan to use as needed.  Now on Tramadol and takes one tab QOD Smokes marijuana 2-3 x a wk  Patient Active Problem List   Diagnosis Date Noted  . S/P total knee arthroplasty, right 01/12/2018  . History of hip replacement 09/22/2017  . Hyperlipidemia 08/10/2017  . Primary osteoarthritis of left hip 06/25/2017  . Primary osteoarthritis of right knee 06/25/2017  . Depression 06/25/2017  . Essential hypertension 06/25/2017  . Class 3 severe obesity due to excess calories without serious comorbidity with body mass index (BMI) of 45.0 to 49.9 in adult ([place]) 06/25/2017  . Tobacco abuse 06/25/2017  . Prediabetes 06/25/2017  . OSA on CPAP 06/25/2017     Current Outpatient Medications on File Prior to Visit  Medication Sig Dispense Refill  . aspirin EC 325 MG tablet Take 1 tablet (325 mg total) by mouth 2 (two) times daily. 84 tablet 0  . buPROPion (WELLBUTRIN) 100 MG tablet Take 1 tablet (100 mg total) by mouth 2 (two) times daily. 60 tablet 3  . hydrochlorothiazide (HYDRODIURIL) 25 MG tablet Take 1 tablet  (25 mg total) by mouth daily. 90 tablet 3  . losartan (COZAAR) 50 MG tablet TAKE 1 TABLET BY MOUTH DAILY. 30 tablet 6  . methocarbamol (ROBAXIN) 750 MG tablet Take 1 tablet (750 mg total) by mouth 2 (two) times daily as needed for muscle spasms. 60 tablet 0  . pravastatin (PRAVACHOL) 20 MG tablet 1 tab Q Mon/Wed and Fri (Patient taking differently: Take 20 mg by mouth every Monday, Wednesday, and Friday. ) 30 tablet 3  . promethazine (PHENERGAN) 25 MG tablet Take 1 tablet (25 mg total) by mouth every 6 (six) hours as needed for nausea. 30 tablet 1  . senna-docusate (SENOKOT S) 8.6-50 MG tablet Take 1 tablet by mouth at bedtime as needed. 30 tablet 1  . senna-docusate (SENOKOT S) 8.6-50 MG tablet Take 1 tablet by mouth at bedtime as needed. 30 tablet 1  . traMADol (ULTRAM) 50 MG tablet TRAMADOL 50 MG 1-2 TABS PO Q 6-8 HRS 40 tablet 0   No current facility-administered medications on file prior to visit.     Allergies  Allergen Reactions  . Lipitor [Atorvastatin] Other (See Comments)    Stabbing pains in legs  . Zoloft [Sertraline Hcl] Other (See Comments)    tremors    Social History   Socioeconomic History  . Marital status: Single    Spouse name: Not on file  . Number of children: 2  .  Years of education: 47  . Highest education level: Not on file  Occupational History  . Occupation: unemployed  Social Needs  . Financial resource strain: Not on file  . Food insecurity:    Worry: Not on file    Inability: Not on file  . Transportation needs:    Medical: Not on file    Non-medical: Not on file  Tobacco Use  . Smoking status: Current Some Day Smoker    Packs/day: 0.25    Years: 30.00    Pack years: 7.50    Types: Cigarettes  . Smokeless tobacco: Never Used  Substance and Sexual Activity  . Alcohol use: Yes    Comment: occasional  . Drug use: No    Comment: smoke 1 joint cannibis 1 week ago.  Marland Kitchen Sexual activity: Yes    Birth control/protection: None  Lifestyle  .  Physical activity:    Days per week: Not on file    Minutes per session: Not on file  . Stress: Not on file  Relationships  . Social connections:    Talks on phone: Not on file    Gets together: Not on file    Attends religious service: Not on file    Active member of club or organization: Not on file    Attends meetings of clubs or organizations: Not on file    Relationship status: Not on file  . Intimate partner violence:    Fear of current or ex partner: Not on file    Emotionally abused: Not on file    Physically abused: Not on file    Forced sexual activity: Not on file  Other Topics Concern  . Not on file  Social History Narrative  . Not on file    Family History  Problem Relation Age of Onset  . Hypertension Mother   . Diabetes Mother   . Hypertension Father   . Breast cancer Maternal Aunt   . Lung cancer Maternal Aunt     Past Surgical History:  Procedure Laterality Date  . TOTAL HIP ARTHROPLASTY Left 09/22/2017   Procedure: LEFT TOTAL HIP ARTHROPLASTY ANTERIOR APPROACH;  Surgeon: Leandrew Koyanagi, MD;  Location: [facility];  Service: Orthopedics;  Laterality: Left;  . TOTAL KNEE ARTHROPLASTY Right 01/12/2018   Procedure: RIGHT TOTAL KNEE ARTHROPLASTY;  Surgeon: Leandrew Koyanagi, MD;  Location: [facility];  Service: Orthopedics;  Laterality: Right;  . TUBAL LIGATION      ROS: Review of Systems Negative except as stated above PHYSICAL EXAM: BP 129/84   Pulse 97   Temp 98.9 F (37.2 C) (Oral)   Resp 16   Wt 266 lb 12.8 oz (121 kg)   SpO2 96%   BMI 45.80 kg/m   Wt Readings from Last 3 Encounters:  05/26/18 266 lb 12.8 oz (121 kg)  01/12/18 268 lb (121.6 kg)  01/05/18 268 lb 11.2 oz (121.9 kg)   BP sitting 123/87 P 80, standing 114/82 P 91  Physical Exam General appearance - alert, well appearing, and in no distress Mental status - normal mood, behavior, speech, dress, motor activity, and thought processes Mouth - mucous membranes moist, pharynx normal without  lesions Neck - supple, no significant adenopathy Chest - clear to auscultation, no wheezes, rales or rhonchi, symmetric air entry Heart - normal rate, regular rhythm, normal S1, S2, no murmurs, rubs, clicks or gallops Abdomen - soft, nontender, nondistended, no masses or organomegaly Neurologic -cranial nerves grossly intact.  Power 5/5 in all fours.  Gross sensation intact.  Gait is normal.  ASSESSMENT AND PLAN: 1. Nausea in adult Differential diagnosis includes dysmotility associated with narcotic medication, GERD, cyclic vomiting associated with marijuana use -Advised to hold off on taking tramadol for now.  Hold off on smoking marijuana.  Start Nexium.  Follow-up in 3 weeks - ondansetron (ZOFRAN) 4 MG tablet; Take 1 tablet (4 mg total) by mouth every 8 (eight) hours as needed for nausea or vomiting.  Dispense: 30 tablet; Refill: 0  2. Essential hypertension At goal.  Continue Cozaar and HCTZ - Comprehensive metabolic panel - CBC  3. Prediabetes Due for recheck. - Hemoglobin A1c  4. Marijuana user Discouraged use.  Patient was given the opportunity to ask questions.  Patient verbalized understanding of the plan and was able to repeat key elements of the plan.   Orders Placed This Encounter  Procedures  . Comprehensive metabolic panel  . CBC  . Hemoglobin A1c     Requested Prescriptions   Signed Prescriptions Disp Refills  . esomeprazole (NEXIUM) 40 MG capsule 30 capsule 3    Sig: Take 1 capsule (40 mg total) by mouth daily.  . ondansetron (ZOFRAN) 4 MG tablet 30 tablet 0    Sig: Take 1 tablet (4 mg total) by mouth every 8 (eight) hours as needed for nausea or vomiting.    Return in about 1 month (around 06/25/2018).  Karle Plumber, MD, FACP

## 2018-05-26 NOTE — Patient Instructions (Signed)
Start Nexium daily. Use Zofran as needed for nausea. Stop Tramadol for now.   Hold off on smoking weed.

## 2018-05-27 LAB — CBC
Hematocrit: 44.7 % (ref 34.0–46.6)
Hemoglobin: 15.4 g/dL (ref 11.1–15.9)
MCH: 31.5 pg (ref 26.6–33.0)
MCHC: 34.5 g/dL (ref 31.5–35.7)
MCV: 91 fL (ref 79–97)
Platelets: 344 10*3/uL (ref 150–450)
RBC: 4.89 x10E6/uL (ref 3.77–5.28)
RDW: 14.4 % (ref 12.3–15.4)
WBC: 11.4 10*3/uL — ABNORMAL HIGH (ref 3.4–10.8)

## 2018-05-27 LAB — COMPREHENSIVE METABOLIC PANEL
ALT: 12 IU/L (ref 0–32)
AST: 17 IU/L (ref 0–40)
Albumin/Globulin Ratio: 1.6 (ref 1.2–2.2)
Albumin: 4.4 g/dL (ref 3.5–5.5)
Alkaline Phosphatase: 93 IU/L (ref 39–117)
BUN/Creatinine Ratio: 12 (ref 9–23)
BUN: 11 mg/dL (ref 6–24)
Bilirubin Total: 0.3 mg/dL (ref 0.0–1.2)
CO2: 20 mmol/L (ref 20–29)
Calcium: 9.5 mg/dL (ref 8.7–10.2)
Chloride: 101 mmol/L (ref 96–106)
Creatinine, Ser: 0.95 mg/dL (ref 0.57–1.00)
GFR calc Af Amer: 80 mL/min/{1.73_m2} (ref >59)
GFR calc non Af Amer: 70 mL/min/{1.73_m2} (ref >59)
Globulin, Total: 2.7 g/dL (ref 1.5–4.5)
Glucose: 109 mg/dL — ABNORMAL HIGH (ref 65–99)
Potassium: 3.5 mmol/L (ref 3.5–5.2)
Sodium: 138 mmol/L (ref 134–144)
Total Protein: 7.1 g/dL (ref 6.0–8.5)

## 2018-05-27 LAB — HEMOGLOBIN A1C
Est. average glucose Bld gHb Est-mCnc: 128 mg/dL
Hgb A1c MFr Bld: 6.1 % — ABNORMAL HIGH (ref 4.8–5.6)

## 2018-06-10 MED FILL — LOSARTAN POTASSIUM 50 MG TA: 50 | 30 days supply | Qty: 30 | Fill #2

## 2018-06-10 MED FILL — HYDROCHLOROTHIAZIDE 25 MG T: 25 | 30 days supply | Qty: 30 | Fill #8

## 2018-06-22 MED FILL — ?ESOMEPRAZOLE MAG DR 40MG C: 40 | 30 days supply | Qty: 30 | Fill #1

## 2018-06-24 ENCOUNTER — Ambulatory Visit: Payer: Self-pay | Admitting: Internal Medicine

## 2018-07-05 ENCOUNTER — Ambulatory Visit (INDEPENDENT_AMBULATORY_CARE_PROVIDER_SITE_OTHER): Payer: Self-pay

## 2018-07-05 ENCOUNTER — Ambulatory Visit (INDEPENDENT_AMBULATORY_CARE_PROVIDER_SITE_OTHER): Payer: Self-pay | Admitting: Orthopaedic Surgery

## 2018-07-05 ENCOUNTER — Encounter (INDEPENDENT_AMBULATORY_CARE_PROVIDER_SITE_OTHER): Payer: Self-pay | Admitting: Orthopaedic Surgery

## 2018-07-05 DIAGNOSIS — G8929 Other chronic pain: Secondary | ICD-10-CM

## 2018-07-05 DIAGNOSIS — Z96642 Presence of left artificial hip joint: Secondary | ICD-10-CM

## 2018-07-05 DIAGNOSIS — Z96651 Presence of right artificial knee joint: Secondary | ICD-10-CM

## 2018-07-05 DIAGNOSIS — M25511 Pain in right shoulder: Secondary | ICD-10-CM

## 2018-07-05 DIAGNOSIS — Z6841 Body Mass Index (BMI) 40.0 and over, adult: Secondary | ICD-10-CM

## 2018-07-05 DIAGNOSIS — M7062 Trochanteric bursitis, left hip: Secondary | ICD-10-CM

## 2018-07-05 MED ORDER — LIDOCAINE HCL 2 % IJ SOLN
2.0000 mL | INTRAMUSCULAR | Status: AC | PRN
Start: 2018-07-05 — End: 2018-07-05
  Administered 2018-07-05: 2 mL

## 2018-07-05 MED ORDER — LIDOCAINE HCL 1 % IJ SOLN
3.0000 mL | INTRAMUSCULAR | Status: AC | PRN
  Administered 2018-07-05: 3 mL

## 2018-07-05 MED ORDER — METHYLPREDNISOLONE ACETATE 40 MG/ML IJ SUSP
40.0000 mg | INTRAMUSCULAR | Status: AC | PRN
  Administered 2018-07-05: 40 mg via INTRA_ARTICULAR

## 2018-07-05 MED ORDER — BUPIVACAINE HCL 0.25 % IJ SOLN
2.0000 mL | INTRAMUSCULAR | Status: AC | PRN
  Administered 2018-07-05: 2 mL via INTRA_ARTICULAR

## 2018-07-05 NOTE — Progress Notes (Signed)
Office Visit Note   Patient: Sarah Randall           Date of Birth: Mar 13, 1966           MRN: [id number] Visit Date: 07/05/2018              Requested by: Ladell Pier, MD [address] [place], [place] [address] PCP: Ladell Pier, MD   Assessment & Plan: Visit Diagnoses:  1. Chronic right shoulder pain   2. Status post total right knee replacement   3. History of left hip replacement   4. Trochanteric bursitis, left hip   5. Morbid obesity ([place])   6. Body mass index 45.0-49.9, adult ([facility])     Plan: Impression is status post right total knee replacement and left total hip replacement.   right shoulder subacromial bursitis versus cervical spine radiculopathy and left hip trochanteric bursitis.  In regards to the left total hip replacement, she will follow-up with [place] in October for repeat evaluation and x-ray.  In regards to the right total knee replacement, she will follow-up with [place] in 6 months for repeat evaluation and x-ray.  In regards to the trochanteric bursitis, we will inject this today as well as provide the patient with iliotibial band exercise program.  In regards to the right shoulder, we will inject the subacromial space with cortisone.  If she is not any better in the next few weeks she will call and let us know.  I have instructed her to pay special attention to the anesthetic phase of the injection to help [place] determine whether or not this may be coming from her shoulder versus cervical spine.  The patient meets the AMA guidelines for Morbid (severe) obesity with a BMI > 40.0 and I have recommended weight loss.   Follow-Up Instructions: Return in about 3 months (around 10/05/2018).   Orders:  Orders Placed This Encounter  Procedures  . Large Joint Inj: R subacromial bursa  . Large Joint Inj: L greater trochanter  . XR KNEE 3 VIEW RIGHT  . XR Shoulder Right  . XR HIP UNILAT W OR W/O PELVIS 2-3 VIEWS LEFT   No orders of the defined types were placed  in this encounter.     Procedures: Large Joint Inj: R subacromial bursa on 07/05/2018 2:18 PM Indications: pain Details: 22 G needle Medications: 2 mL lidocaine 2 %; 2 mL bupivacaine 0.25 %; 40 mg methylPREDNISolone acetate 40 MG/ML Outcome: tolerated well, no immediate complications Patient was prepped and draped in the usual sterile fashion.   Large Joint Inj: L greater trochanter on 07/05/2018 2:19 PM Indications: pain Details: 22 G needle, lateral approach Medications: 3 mL lidocaine 1 %; 2 mL bupivacaine 0.25 %; 40 mg methylPREDNISolone acetate 40 MG/ML      Clinical Data: No additional findings.   Subjective: Chief Complaint  Patient presents with  . Right Shoulder - Pain  . Left Hip - Pain  . Right Knee - Pain    HPI patient is a pleasant 52 year old female who presents to our clinic today 6 months status post right total knee replacement, date of surgery 01/12/2018.  Doing very well.  Occasional soreness but nothing more.  She has regained full motion and strength.  She is also 9 months status post left total hip replacement, date of surgery 09/22/2017.  Doing well with that but does note occasional groin pain.  She is having more pain to the lateral aspect of her hip worse  with abduction.  She also feels that she has not regained full strength.  Other issue she brings up today is her right shoulder.  3-week history of right shoulder pain without any known injury or change in activity.  The pain she has is to the entire shoulder but radiates up to the lateral aspect of her neck as well as down to her forearm.  Pain is worse with abduction.  She also notes some pain with lateral rotation of the neck.  She admits to occasional burning down the arm.  She has been taking Advil with minimal relief of symptoms.  No history of shoulder or neck pathology.  Review of Systems As detailed in HPI.  All others reviewed and are negative.  Objective: Vital Signs: There were no vitals  taken for this visit.  Physical Exam well-developed and well-nourished female in no acute distress.  Alert and oriented x3.  Ortho Exam examination of her right knee shows a well-healed surgical incision without evidence of infection.  Range of motion 0 to 100 degrees.  Stable valgus varus stress.  Examination of the left hip shows a well-healed surgical incision without evidence of infection.  Negative logroll.  Moderate tenderness over the trochanteric bursa.  Examination of the right shoulder shows full active range of motion in all planes.  Minimally positive empty can.  Benign exam of the cervical spine.  She is neurovascularly intact distally.  Specialty Comments:  No specialty comments available.  Imaging: Xr Hip Unilat W Or W/o Pelvis 2-3 Views Left  Result Date: 07/05/2018 Well-seated prosthesis   Xr Knee 3 View Right  Result Date: 07/05/2018 Well-seated prosthesis without evidence of subsidence or ostial lysis  Xr Shoulder Right  Result Date: 07/05/2018 Moderate AC degenerative changes    PMFS History: Patient Active Problem List   Diagnosis Date Noted  . Chronic right shoulder pain 07/05/2018  . Trochanteric bursitis, left hip 07/05/2018  . Morbid obesity ([place]) 07/05/2018  . Body mass index 45.0-49.9, adult ([place]) 07/05/2018  . Marijuana user 05/26/2018  . S/P total knee arthroplasty, right 01/12/2018  . History of hip replacement 09/22/2017  . Hyperlipidemia 08/10/2017  . Primary osteoarthritis of left hip 06/25/2017  . Primary osteoarthritis of right knee 06/25/2017  . Depression 06/25/2017  . Essential hypertension 06/25/2017  . Class 3 severe obesity due to excess calories without serious comorbidity with body mass index (BMI) of 45.0 to 49.9 in adult ([place]) 06/25/2017  . Tobacco abuse 06/25/2017  . Prediabetes 06/25/2017  . OSA on CPAP 06/25/2017   Past Medical History:  Diagnosis Date  . Arthritis   . Depression   . GERD (gastroesophageal reflux disease)     occasionally takes OTC  . Hypertension   . Pre-diabetes   . Sleep apnea    tested 2014 - unable to tolerate machine    Family History  Problem Relation Age of Onset  . Hypertension Mother   . Diabetes Mother   . Hypertension Father   . Breast cancer Maternal Aunt   . Lung cancer Maternal Aunt     Past Surgical History:  Procedure Laterality Date  . TOTAL HIP ARTHROPLASTY Left 09/22/2017   Procedure: LEFT TOTAL HIP ARTHROPLASTY ANTERIOR APPROACH;  Surgeon: Leandrew Koyanagi, MD;  Location: [facility];  Service: Orthopedics;  Laterality: Left;  . TOTAL KNEE ARTHROPLASTY Right 01/12/2018   Procedure: RIGHT TOTAL KNEE ARTHROPLASTY;  Surgeon: Leandrew Koyanagi, MD;  Location: [facility];  Service: Orthopedics;  Laterality: Right;  .  TUBAL LIGATION     Social History   Occupational History  . Occupation: unemployed  Tobacco Use  . Smoking status: Current Some Day Smoker    Packs/day: 0.25    Years: 30.00    Pack years: 7.50    Types: Cigarettes  . Smokeless tobacco: Never Used  Substance and Sexual Activity  . Alcohol use: Yes    Comment: occasional  . Drug use: No    Comment: smoke 1 joint cannibis 1 week ago.  Marland Kitchen Sexual activity: Yes    Birth control/protection: None

## 2018-07-14 ENCOUNTER — Ambulatory Visit: Payer: Self-pay | Admitting: Internal Medicine

## 2018-07-19 ENCOUNTER — Ambulatory Visit (INDEPENDENT_AMBULATORY_CARE_PROVIDER_SITE_OTHER): Payer: Self-pay | Admitting: Orthopaedic Surgery

## 2018-08-01 ENCOUNTER — Other Ambulatory Visit: Payer: Self-pay | Admitting: Internal Medicine

## 2018-08-01 DIAGNOSIS — I10 Essential (primary) hypertension: Secondary | ICD-10-CM

## 2018-08-01 MED FILL — ?ESOMEPRAZOLE MAG DR 40MG C: 40 | 30 days supply | Qty: 30 | Fill #2

## 2018-08-01 MED FILL — LOSARTAN POTASSIUM 50 MG TA: 50 | 30 days supply | Qty: 30 | Fill #3

## 2018-08-01 MED FILL — HYDROCHLOROTHIAZIDE 25 MG T: 25 | 30 days supply | Qty: 30 | Fill #0

## 2018-09-08 ENCOUNTER — Ambulatory Visit: Payer: Self-pay | Attending: Internal Medicine | Admitting: Physician Assistant

## 2018-09-08 ENCOUNTER — Other Ambulatory Visit: Payer: Self-pay

## 2018-09-08 VITALS — BP 124/80 | HR 89 | Temp 99.1°F | Resp 18 | Ht 64.0 in | Wt 269.8 lb

## 2018-09-08 DIAGNOSIS — R42 Dizziness and giddiness: Secondary | ICD-10-CM | POA: Insufficient documentation

## 2018-09-08 DIAGNOSIS — I10 Essential (primary) hypertension: Secondary | ICD-10-CM | POA: Insufficient documentation

## 2018-09-08 DIAGNOSIS — Z1211 Encounter for screening for malignant neoplasm of colon: Secondary | ICD-10-CM

## 2018-09-08 DIAGNOSIS — E785 Hyperlipidemia, unspecified: Secondary | ICD-10-CM | POA: Insufficient documentation

## 2018-09-08 DIAGNOSIS — R7303 Prediabetes: Secondary | ICD-10-CM | POA: Insufficient documentation

## 2018-09-08 DIAGNOSIS — N920 Excessive and frequent menstruation with regular cycle: Secondary | ICD-10-CM | POA: Insufficient documentation

## 2018-09-08 DIAGNOSIS — Z7982 Long term (current) use of aspirin: Secondary | ICD-10-CM | POA: Insufficient documentation

## 2018-09-08 DIAGNOSIS — K219 Gastro-esophageal reflux disease without esophagitis: Secondary | ICD-10-CM | POA: Insufficient documentation

## 2018-09-08 DIAGNOSIS — N921 Excessive and frequent menstruation with irregular cycle: Secondary | ICD-10-CM

## 2018-09-08 LAB — GLUCOSE, POCT (MANUAL RESULT ENTRY): POC Glucose: 102 mg/dl — AB (ref 70–99)

## 2018-09-08 MED ORDER — MECLIZINE HCL 25 MG PO TABS: ORAL_TABLET | ORAL | 0 refills | Status: DC

## 2018-09-08 MED ORDER — HYDROCHLOROTHIAZIDE 25 MG PO TABS: 25.0000 mg | ORAL_TABLET | Freq: Every day | ORAL | 2 refills | Status: DC

## 2018-09-08 MED ORDER — ESOMEPRAZOLE MAGNESIUM 40 MG PO CPDR: 40.0000 mg | DELAYED_RELEASE_CAPSULE | Freq: Every day | ORAL | 3 refills | Status: DC

## 2018-09-08 MED ORDER — PRAVASTATIN SODIUM 20 MG PO TABS: ORAL_TABLET | ORAL | 3 refills | Status: DC

## 2018-09-08 MED ORDER — LOSARTAN POTASSIUM 50 MG PO TABS: 50.0000 mg | ORAL_TABLET | Freq: Every day | ORAL | 6 refills | Status: DC

## 2018-09-08 MED FILL — LOSARTAN POTASSIUM 50 MG TA: 50 | 30 days supply | Qty: 30 | Fill #0

## 2018-09-08 MED FILL — PRAVASTATIN SODIUM 20 MG TA: 20 | 30 days supply | Qty: 12 | Fill #0

## 2018-09-08 MED FILL — ESOMEPRAZOLE MAG DR 40 MG C: 40 | 30 days supply | Qty: 30 | Fill #0

## 2018-09-08 MED FILL — MECLIZINE 25 MG TABLET: 25 | 10 days supply | Qty: 30 | Fill #0

## 2018-09-08 MED FILL — HYDROCHLOROTHIAZIDE 25 MG T: 25 | 30 days supply | Qty: 30 | Fill #0

## 2018-09-08 NOTE — Progress Notes (Signed)
Flu: yes  Pain: nagging headache currently 6 Dizziness headache comes after started last month and stayed a few days and Sunday couldn't get up and walk,  Patient stated that she will need medications on all medications she is currently taking.

## 2018-09-08 NOTE — Patient Instructions (Signed)
Drink 80-100 ounces water daily to help prevent dizziness.    Dizziness Dizziness is a common problem. It makes you feel unsteady or light-headed. You may feel like you are about to pass out (faint). Dizziness can lead to getting hurt if you stumble or fall. Dizziness can be caused by many things, including:  Medicines.  Not having enough water in your body (dehydration).  Illness.  Follow these instructions at home: Eating and drinking  Drink enough fluid to keep your pee (urine) clear or pale yellow. This helps to keep you from getting dehydrated. Try to drink more clear fluids, such as water.  Do not drink alcohol.  Limit how much caffeine you drink or eat, if your doctor tells you to do that.  Limit how much salt (sodium) you drink or eat, if your doctor tells you to do that. Activity  Avoid making quick movements. ? When you stand up from sitting in a chair, steady yourself until you feel okay. ? In the morning, first sit up on the side of the bed. When you feel okay, stand slowly while you hold onto something. Do this until you know that your balance is fine.  If you need to stand in one place for a long time, move your legs often. Tighten and relax the muscles in your legs while you are standing.  Do not drive or use heavy machinery if you feel dizzy.  Avoid bending down if you feel dizzy. Place items in your home so you can reach them easily without leaning over. Lifestyle  Do not use any products that contain nicotine or tobacco, such as cigarettes and e-cigarettes. If you need help quitting, ask your doctor.  Try to lower your stress level. You can do this by using methods such as yoga or meditation. Talk with your doctor if you need help. General instructions  Watch your dizziness for any changes.  Take over-the-counter and prescription medicines only as told by your doctor. Talk with your doctor if you think that you are dizzy because of a medicine that you are  taking.  Tell a friend or a family member that you are feeling dizzy. If he or she notices any changes in your behavior, have this person call your doctor.  Keep all follow-up visits as told by your doctor. This is important. Contact a doctor if:  Your dizziness does not go away.  Your dizziness or light-headedness gets worse.  You feel sick to your stomach (nauseous).  You have trouble hearing.  You have new symptoms.  You are unsteady on your feet.  You feel like the room is spinning. Get help right away if:  You throw up (vomit) or have watery poop (diarrhea), and you cannot eat or drink anything.  You have trouble: ? Talking. ? Walking. ? Swallowing. ? Using your arms, hands, or legs.  You feel generally weak.  You are not thinking clearly, or you have trouble forming sentences. A friend or family member may notice this.  You have: ? Chest pain. ? Pain in your belly (abdomen). ? Shortness of breath. ? Sweating.  Your vision changes.  You are bleeding.  You have a very bad headache.  You have neck pain or a stiff neck.  You have a fever. These symptoms may be an emergency. Do not wait to see if the symptoms will go away. Get medical help right away. Call your local emergency services (911 in the [place] Do not drive yourself to the  hospital. Summary  Dizziness makes you feel unsteady or light-headed. You may feel like you are about to pass out (faint).  Drink enough fluid to keep your pee (urine) clear or pale yellow. Do not drink alcohol.  Avoid making quick movements if you feel dizzy.  Watch your dizziness for any changes. This information is not intended to replace advice given to you by your health care provider. Make sure you discuss any questions you have with your health care provider. Document Released: 11/12/2011 Document Revised: 12/10/2016 Document Reviewed: 12/10/2016 Elsevier Interactive Patient Education  2017 [employer].

## 2018-09-08 NOTE — Progress Notes (Signed)
Patient ID: Sarah Randall, female   DOB: [birth year], 52 y.o.   MRN: [id number]      Sarah Randall, is a 52 y.o. female  WCB:[id number]  OHY:[id number]  DOB - 04-15-1966  Subjective:  Chief Complaint and HPI: Sarah Randall is a 52 y.o. female here today w/ c/o dizziness intermittently.  Episodes have occurred during position changes(getting out of bed/standing too quickly and seem to occur just prior to menses).  Periods have always been regular and very heavy.  Now irregular but still very heavy.  July and august no period at all.  September had 3-4 days of "dizzy spells" that were intermittent and fleeting then started her period and dizziness resolved. Sunday of this week had dizziness for a few hours then 2 days later started her period.  No dizziness since Sunday of this week.  Heavy period  with clotting(which is typical for her).  No vision changes.  She does c/o mild HA.  No N/V/D.  No CP/no SOB.  Denies h/o anemia  Needs med RF  Family history:  No h/o early CV events or CVA.    ROS:   Constitutional:  No f/c, No night sweats, No unexplained weight loss. EENT:  No vision changes, No blurry vision, No hearing changes. No mouth, throat, or ear problems.  Respiratory: No cough, No SOB Cardiac: No CP, no palpitations GI:  No abd pain, No N/V/D. GU: No Urinary s/sx Musculoskeletal: No joint pain Neuro: No headache, no dizziness, no motor weakness.  Skin: No rash Endocrine:  No polydipsia. No polyuria.  Psych: Denies SI/HI  No problems updated.  ALLERGIES: Allergies  Allergen Reactions  . Lipitor [Atorvastatin] Other (See Comments)    Stabbing pains in legs  . Zoloft [Sertraline Hcl] Other (See Comments)    tremors    PAST MEDICAL HISTORY: Past Medical History:  Diagnosis Date  . Arthritis   . Depression   . GERD (gastroesophageal reflux disease)    occasionally takes OTC  . Hypertension   . Pre-diabetes   . Sleep apnea    tested 2014 - unable to tolerate machine     MEDICATIONS AT HOME: Prior to Admission medications   Medication Sig Start Date End Date Taking? Authorizing Provider  esomeprazole (NEXIUM) 40 MG capsule Take 1 capsule (40 mg total) by mouth daily. 09/08/18  Yes Charlean Carneal M, PA-C  hydrochlorothiazide (HYDRODIURIL) 25 MG tablet Take 1 tablet (25 mg total) by mouth daily. 09/08/18  Yes Santez Woodcox M, PA-C  losartan (COZAAR) 50 MG tablet Take 1 tablet (50 mg total) by mouth daily. 09/08/18  Yes Freeman Caldron M, PA-C  pravastatin (PRAVACHOL) 20 MG tablet 1 tab Q Mon/Wed and Fri 09/08/18  Yes Marlaine Arey, Dionne Bucy, PA-C  aspirin EC 325 MG tablet Take 1 tablet (325 mg total) by mouth 2 (two) times daily. Patient not taking: Reported on 09/08/2018 01/12/18   Leandrew Koyanagi, MD  meclizine (ANTIVERT) 25 MG tablet 1/2-1 tid prn dizziness 09/08/18   Argentina Donovan, PA-C  methocarbamol (ROBAXIN) 750 MG tablet Take 1 tablet (750 mg total) by mouth 2 (two) times daily as needed for muscle spasms. Patient not taking: Reported on 07/05/2018 01/12/18   Leandrew Koyanagi, MD  ondansetron (ZOFRAN) 4 MG tablet Take 1 tablet (4 mg total) by mouth every 8 (eight) hours as needed for nausea or vomiting. Patient not taking: Reported on 07/05/2018 05/26/18   Ladell Pier, MD  promethazine (PHENERGAN) 25 MG tablet Take 1 tablet (  25 mg total) by mouth every 6 (six) hours as needed for nausea. Patient not taking: Reported on 07/05/2018 01/12/18   Leandrew Koyanagi, MD  senna-docusate (SENOKOT S) 8.6-50 MG tablet Take 1 tablet by mouth at bedtime as needed. Patient not taking: Reported on 07/05/2018 09/22/17   Leandrew Koyanagi, MD     Objective:  EXAM:   Vitals:   09/08/18 0946  BP: 124/80  Pulse: 89  Resp: 18  Temp: 99.1 F (37.3 C)  TempSrc: Oral  SpO2: 97%  Weight: 269 lb 12.8 oz (122.4 kg)  Height: 5\' 4"  (1.626 m)    General appearance : A&OX3. NAD. Non-toxic-appearing HEENT: Atraumatic and Normocephalic.  PERRLA. EOM intact. No nystagmus.  TM clear  B. Mouth-MMM, post pharynx WNL w/o erythema, No PND. Neck: supple, no JVD. No cervical lymphadenopathy. No thyromegaly Chest/Lungs:  Breathing-non-labored, Good air entry bilaterally, breath sounds normal without rales, rhonchi, or wheezing  CVS: S1 S2 regular, no murmurs, gallops, rubs  Abdomen: Bowel sounds present, Non tender and not distended with no gaurding, rigidity or rebound. Extremities: Bilateral Lower Ext shows no edema, both legs are warm to touch with = pulse throughout Neurology:  CN II-XII grossly intact, Non focal.  Normal finger to nose/heel to shin.  Neg rhomberg. Psych:  TP linear. J/I WNL. Normal speech. Appropriate eye contact and affect.  Skin:  No Rash  Data Review Lab Results  Component Value Date   HGBA1C 6.1 (H) 05/26/2018   HGBA1C 5.9 (H) 01/05/2018   HGBA1C 5.8 (H) 09/13/2017     Assessment & Plan   1. Dizziness No red flags.  EKG ok/NSR and compared to 09/2017 EKG.  No ST changes today.  Thus far episodes have been just prior to menses for last 2 months then resolve - EKG 12-Lead - Comprehensive metabolic panel - CBC with Differential/Platelet - Vitamin D, 25-hydroxy - meclizine (ANTIVERT) 25 MG tablet; 1/2-1 tid prn dizziness  Dispense: 30 tablet; Refill: 0  2. Prediabetes I have had a lengthy discussion and provided education about insulin resistance and the intake of too much sugar/refined carbohydrates.  I have advised the patient to work at a goal of eliminating sugary drinks, candy, desserts, sweets, refined sugars, processed foods, and white carbohydrates.  The patient expresses understanding.  - Glucose (CBG) - Comprehensive metabolic panel - Hemoglobin A1c  3. Hyperlipidemia, unspecified hyperlipidemia type - pravastatin (PRAVACHOL) 20 MG tablet; 1 tab Q Mon/Wed and Fri  Dispense: 30 tablet; Refill: 3  4. Essential hypertension Controlled-continue current regimen - losartan (COZAAR) 50 MG tablet; Take 1 tablet (50 mg total) by mouth  daily.  Dispense: 30 tablet; Refill: 6 - hydrochlorothiazide (HYDRODIURIL) 25 MG tablet; Take 1 tablet (25 mg total) by mouth daily.  Dispense: 30 tablet; Refill: 2 - Comprehensive metabolic panel  5. Colon cancer screening - Ambulatory referral to Gastroenterology  6. Menorrhagia with irregular cycle - TSH And CBC  7. Gastroesophageal reflux disease without esophagitis - esomeprazole (NEXIUM) 40 MG capsule; Take 1 capsule (40 mg total) by mouth daily.  Dispense: 30 capsule; Refill: 3   Patient have been counseled extensively about nutrition and exercise  Return in about 1 month (around 10/09/2018) for Dr [name] Benes dizziness.  The patient was given clear instructions to go to ER or return to medical center if symptoms don't improve, worsen or new problems develop. The patient verbalized understanding. The patient was told to call to get lab results if they haven't heard anything in the next week.  Freeman Caldron, PA-C [facility] and [place], [place]   09/08/2018, 10:17 AM

## 2018-09-09 ENCOUNTER — Other Ambulatory Visit: Payer: Self-pay | Admitting: Physician Assistant

## 2018-09-09 LAB — CBC WITH DIFFERENTIAL/PLATELET
Basophils Absolute: 0.1 10*3/uL (ref 0.0–0.2)
Basos: 1 %
EOS (ABSOLUTE): 0.3 10*3/uL (ref 0.0–0.4)
Eos: 2 %
Hematocrit: 44.2 % (ref 34.0–46.6)
Hemoglobin: 15.5 g/dL (ref 11.1–15.9)
Immature Grans (Abs): 0 10*3/uL (ref 0.0–0.1)
Immature Granulocytes: 0 %
Lymphocytes Absolute: 3.2 10*3/uL — ABNORMAL HIGH (ref 0.7–3.1)
Lymphs: 31 %
MCH: 32.7 pg (ref 26.6–33.0)
MCHC: 35.1 g/dL (ref 31.5–35.7)
MCV: 93 fL (ref 79–97)
Monocytes Absolute: 0.7 10*3/uL (ref 0.1–0.9)
Monocytes: 7 %
Neutrophils Absolute: 6.3 10*3/uL (ref 1.4–7.0)
Neutrophils: 59 %
Platelets: 395 10*3/uL (ref 150–450)
RBC: 4.74 x10E6/uL (ref 3.77–5.28)
RDW: 14.2 % (ref 12.3–15.4)
WBC: 10.5 10*3/uL (ref 3.4–10.8)

## 2018-09-09 LAB — COMPREHENSIVE METABOLIC PANEL
ALT: 14 IU/L (ref 0–32)
AST: 14 IU/L (ref 0–40)
Albumin/Globulin Ratio: 1.4 (ref 1.2–2.2)
Albumin: 4.2 g/dL (ref 3.5–5.5)
Alkaline Phosphatase: 87 IU/L (ref 39–117)
BUN/Creatinine Ratio: 15 (ref 9–23)
BUN: 15 mg/dL (ref 6–24)
Bilirubin Total: 0.3 mg/dL (ref 0.0–1.2)
CO2: 23 mmol/L (ref 20–29)
Calcium: 9.6 mg/dL (ref 8.7–10.2)
Chloride: 98 mmol/L (ref 96–106)
Creatinine, Ser: 1 mg/dL (ref 0.57–1.00)
GFR calc Af Amer: 75 mL/min/{1.73_m2} (ref >59)
GFR calc non Af Amer: 65 mL/min/{1.73_m2} (ref >59)
Globulin, Total: 3 g/dL (ref 1.5–4.5)
Glucose: 92 mg/dL (ref 65–99)
Potassium: 4.3 mmol/L (ref 3.5–5.2)
Sodium: 138 mmol/L (ref 134–144)
Total Protein: 7.2 g/dL (ref 6.0–8.5)

## 2018-09-09 LAB — HEMOGLOBIN A1C
Est. average glucose Bld gHb Est-mCnc: 126 mg/dL
Hgb A1c MFr Bld: 6 % — ABNORMAL HIGH (ref 4.8–5.6)

## 2018-09-09 LAB — VITAMIN D 25 HYDROXY (VIT D DEFICIENCY, FRACTURES): Vit D, 25-Hydroxy: 14.9 ng/mL — ABNORMAL LOW (ref 30.0–100.0)

## 2018-09-09 LAB — TSH: TSH: 0.751 u[IU]/mL (ref 0.450–4.500)

## 2018-09-09 MED ORDER — VITAMIN D (ERGOCALCIFEROL) 1.25 MG (50000 UNIT) PO CAPS: 50000.0000 [IU] | ORAL_CAPSULE | ORAL | 0 refills | Status: DC

## 2018-09-09 MED FILL — VIT D2 1.25 MG (50,000 UNIT: 1.25 MG | 28 days supply | Qty: 4 | Fill #0

## 2018-09-30 ENCOUNTER — Ambulatory Visit: Payer: Self-pay | Admitting: Internal Medicine

## 2018-10-05 ENCOUNTER — Ambulatory Visit (INDEPENDENT_AMBULATORY_CARE_PROVIDER_SITE_OTHER): Payer: Self-pay | Admitting: Physician Assistant

## 2018-10-05 ENCOUNTER — Ambulatory Visit (INDEPENDENT_AMBULATORY_CARE_PROVIDER_SITE_OTHER): Payer: Self-pay | Admitting: Orthopaedic Surgery

## 2018-10-10 ENCOUNTER — Other Ambulatory Visit: Payer: Self-pay

## 2018-10-10 ENCOUNTER — Encounter: Payer: Self-pay | Admitting: Internal Medicine

## 2018-10-10 ENCOUNTER — Ambulatory Visit: Payer: Self-pay | Attending: Internal Medicine | Admitting: Licensed Clinical Social Worker

## 2018-10-10 ENCOUNTER — Ambulatory Visit: Payer: Self-pay | Attending: Internal Medicine | Admitting: Internal Medicine

## 2018-10-10 VITALS — BP 120/87 | HR 101 | Temp 98.1°F | Resp 16 | Wt 273.2 lb

## 2018-10-10 DIAGNOSIS — G4733 Obstructive sleep apnea (adult) (pediatric): Secondary | ICD-10-CM | POA: Insufficient documentation

## 2018-10-10 DIAGNOSIS — I10 Essential (primary) hypertension: Secondary | ICD-10-CM | POA: Insufficient documentation

## 2018-10-10 DIAGNOSIS — F419 Anxiety disorder, unspecified: Secondary | ICD-10-CM

## 2018-10-10 DIAGNOSIS — Z96651 Presence of right artificial knee joint: Secondary | ICD-10-CM | POA: Insufficient documentation

## 2018-10-10 DIAGNOSIS — Z6841 Body Mass Index (BMI) 40.0 and over, adult: Secondary | ICD-10-CM | POA: Insufficient documentation

## 2018-10-10 DIAGNOSIS — E669 Obesity, unspecified: Secondary | ICD-10-CM | POA: Insufficient documentation

## 2018-10-10 DIAGNOSIS — F17210 Nicotine dependence, cigarettes, uncomplicated: Secondary | ICD-10-CM | POA: Insufficient documentation

## 2018-10-10 DIAGNOSIS — M1711 Unilateral primary osteoarthritis, right knee: Secondary | ICD-10-CM | POA: Insufficient documentation

## 2018-10-10 DIAGNOSIS — R079 Chest pain, unspecified: Secondary | ICD-10-CM

## 2018-10-10 DIAGNOSIS — G250 Essential tremor: Secondary | ICD-10-CM

## 2018-10-10 DIAGNOSIS — F32A Depression, unspecified: Secondary | ICD-10-CM

## 2018-10-10 DIAGNOSIS — R51 Headache: Secondary | ICD-10-CM | POA: Insufficient documentation

## 2018-10-10 DIAGNOSIS — E785 Hyperlipidemia, unspecified: Secondary | ICD-10-CM | POA: Insufficient documentation

## 2018-10-10 DIAGNOSIS — F17200 Nicotine dependence, unspecified, uncomplicated: Secondary | ICD-10-CM

## 2018-10-10 DIAGNOSIS — F329 Major depressive disorder, single episode, unspecified: Secondary | ICD-10-CM | POA: Insufficient documentation

## 2018-10-10 DIAGNOSIS — K219 Gastro-esophageal reflux disease without esophagitis: Secondary | ICD-10-CM | POA: Insufficient documentation

## 2018-10-10 DIAGNOSIS — F331 Major depressive disorder, recurrent, moderate: Secondary | ICD-10-CM

## 2018-10-10 DIAGNOSIS — R7303 Prediabetes: Secondary | ICD-10-CM | POA: Insufficient documentation

## 2018-10-10 DIAGNOSIS — M1612 Unilateral primary osteoarthritis, left hip: Secondary | ICD-10-CM | POA: Insufficient documentation

## 2018-10-10 DIAGNOSIS — G43011 Migraine without aura, intractable, with status migrainosus: Secondary | ICD-10-CM

## 2018-10-10 MED ORDER — ESCITALOPRAM OXALATE 10 MG PO TABS: ORAL_TABLET | ORAL | 1 refills | Status: DC

## 2018-10-10 MED ORDER — NICOTINE 21 MG/24HR TD PT24: 21.0000 mg | MEDICATED_PATCH | Freq: Every day | TRANSDERMAL | 0 refills | Status: DC

## 2018-10-10 MED ORDER — PREDNISONE 20 MG PO TABS: 20.0000 mg | ORAL_TABLET | Freq: Every day | ORAL | 0 refills | Status: DC

## 2018-10-10 MED ORDER — NICOTINE 14 MG/24HR TD PT24: 14.0000 mg | MEDICATED_PATCH | Freq: Every day | TRANSDERMAL | 0 refills | Status: DC

## 2018-10-10 MED ORDER — PROPRANOLOL HCL 10 MG PO TABS: 10.0000 mg | ORAL_TABLET | Freq: Two times a day (BID) | ORAL | 2 refills | Status: DC

## 2018-10-10 MED FILL — PROPRANOLOL 10 MG TABLET: 10 | 30 days supply | Qty: 60 | Fill #0

## 2018-10-10 MED FILL — predniSONE 20 MG TABS: 20 | 4 days supply | Qty: 4 | Fill #0

## 2018-10-10 MED FILL — NICOTINE 21 MG/24HR PATCH: 21 | 28 days supply | Qty: 28 | Fill #0

## 2018-10-10 NOTE — Progress Notes (Signed)
Patient ID: Sarah Randall, female    DOB: [birth year]  MRN: [id number]  CC: Follow-up   Subjective: Sarah Randall is a 52 y.o. female who presents for chronic ds management. Her concerns today include:  Pt with hx of HTN,pre-DM, tob abuse, OSA has CPAP but not using, obesity,GERD,depression, obesity,DDDoflumbar spine, OA RT hip and LT knees/p jt replacements  C/o constant HA:  Reports HA on and off x 2 mths.  Constant for past 2 wks; prior to this, HA would last up to 2 hrs.  LT sided.  No N/V/blurred vision.  +photophobia/phenophobia.  -better with laying down with lights and sound off -takes Aleve, BC Powder and Ibuprofen without relief. She would take three 200 mg Advil QOD. She takes something at least once a day  C/o fine tremors of hands x 3 mths.  Initially she noticed it only when she got upset.  Now the fine tremor is there any time she tries to hold something in her hand or when she is pointing or reaching for something.  Does not drink coffee.  TSH done on recent visit was in normal range.  Chest pains: c/o intermittent CP x 1 mth.  Usually comes on when she is not doing anything strenuous. 2 bad episodes last wk where pain felt like a cramp under and around the LT breast.  Episodes associated with radiation to LT arm and SOB.  No associated N/V -pt not very active -smokes about 1/2 pk a day, + fhx of CAD in father dx at age 56.  Smoke x 30 yrs. Quit for 2 mths in past.  Trying to wean herself down, was at 1 pk/day.    Depression screen positive today.  Patient reports that she is going through a lot trying to take care of herself and also her to elderly parents.  Her father has dementia and her mother has breast cancer.  She has other siblings who live nearby but nonverbal wants to help out with the care of the parents.  She has been stressed and depressed about this.  Her son also was recently incarcerated.  She feels she would benefit from being on medication for depression  and also some counseling.  Tried with Zoloft in the past but discontinued it because it caused tremors.  Patient Active Problem List   Diagnosis Date Noted  . Chronic right shoulder pain 07/05/2018  . Trochanteric bursitis, left hip 07/05/2018  . Morbid obesity ([place]) 07/05/2018  . Body mass index 45.0-49.9, adult ([place]) 07/05/2018  . Marijuana user 05/26/2018  . S/P total knee arthroplasty, right 01/12/2018  . History of hip replacement 09/22/2017  . Hyperlipidemia 08/10/2017  . Primary osteoarthritis of left hip 06/25/2017  . Primary osteoarthritis of right knee 06/25/2017  . Depression 06/25/2017  . Essential hypertension 06/25/2017  . Class 3 severe obesity due to excess calories without serious comorbidity with body mass index (BMI) of 45.0 to 49.9 in adult ([place]) 06/25/2017  . Tobacco abuse 06/25/2017  . Prediabetes 06/25/2017  . OSA on CPAP 06/25/2017     Current Outpatient Medications on File Prior to Visit  Medication Sig Dispense Refill  . esomeprazole (NEXIUM) 40 MG capsule Take 1 capsule (40 mg total) by mouth daily. 30 capsule 3  . hydrochlorothiazide (HYDRODIURIL) 25 MG tablet Take 1 tablet (25 mg total) by mouth daily. 30 tablet 2  . losartan (COZAAR) 50 MG tablet Take 1 tablet (50 mg total) by mouth daily. 30 tablet 6  .  meclizine (ANTIVERT) 25 MG tablet 1/2-1 tid prn dizziness 30 tablet 0  . pravastatin (PRAVACHOL) 20 MG tablet 1 tab Q Mon/Wed and Fri 30 tablet 3  . Vitamin D, Ergocalciferol, (DRISDOL) 50000 units CAPS capsule Take 1 capsule (50,000 Units total) by mouth every 7 (seven) days. 16 capsule 0   No current facility-administered medications on file prior to visit.     Allergies  Allergen Reactions  . Lipitor [Atorvastatin] Other (See Comments)    Stabbing pains in legs  . Zoloft [Sertraline Hcl] Other (See Comments)    tremors    Social History   Socioeconomic History  . Marital status: Single    Spouse name: Not on file  . Number of children: 2   . Years of education: 9  . Highest education level: Not on file  Occupational History  . Occupation: unemployed  Social Needs  . Financial resource strain: Not on file  . Food insecurity:    Worry: Not on file    Inability: Not on file  . Transportation needs:    Medical: Not on file    Non-medical: Not on file  Tobacco Use  . Smoking status: Current Some Day Smoker    Packs/day: 0.25    Years: 30.00    Pack years: 7.50    Types: Cigarettes  . Smokeless tobacco: Never Used  Substance and Sexual Activity  . Alcohol use: Yes    Comment: occasional  . Drug use: No    Comment: smoke 1 joint cannibis 1 week ago.  Marland Kitchen Sexual activity: Yes    Birth control/protection: None  Lifestyle  . Physical activity:    Days per week: Not on file    Minutes per session: Not on file  . Stress: Not on file  Relationships  . Social connections:    Talks on phone: Not on file    Gets together: Not on file    Attends religious service: Not on file    Active member of club or organization: Not on file    Attends meetings of clubs or organizations: Not on file    Relationship status: Not on file  . Intimate partner violence:    Fear of current or ex partner: Not on file    Emotionally abused: Not on file    Physically abused: Not on file    Forced sexual activity: Not on file  Other Topics Concern  . Not on file  Social History Narrative  . Not on file    Family History  Problem Relation Age of Onset  . Hypertension Mother   . Diabetes Mother   . Hypertension Father   . Breast cancer Maternal Aunt   . Lung cancer Maternal Aunt     Past Surgical History:  Procedure Laterality Date  . TOTAL HIP ARTHROPLASTY Left 09/22/2017   Procedure: LEFT TOTAL HIP ARTHROPLASTY ANTERIOR APPROACH;  Surgeon: Leandrew Koyanagi, MD;  Location: [facility];  Service: Orthopedics;  Laterality: Left;  . TOTAL KNEE ARTHROPLASTY Right 01/12/2018   Procedure: RIGHT TOTAL KNEE ARTHROPLASTY;  Surgeon: Leandrew Koyanagi,  MD;  Location: [facility];  Service: Orthopedics;  Laterality: Right;  . TUBAL LIGATION      ROS: Review of Systems Negative except as above PHYSICAL EXAM: BP 120/87   Pulse (!) 101   Temp 98.1 F (36.7 C) (Oral)   Resp 16   Wt 273 lb 3.2 oz (123.9 kg)   SpO2 98%   BMI 46.89 kg/m  Wt Readings from Last 3 Encounters:  10/10/18 273 lb 3.2 oz (123.9 kg)  09/08/18 269 lb 12.8 oz (122.4 kg)  05/26/18 266 lb 12.8 oz (121 kg)  BP 118/90  Physical Exam  General appearance - alert, well appearing, and in no distress Mental status - normal mood, behavior, speech, dress, motor activity, and thought processes Mouth - mucous membranes moist, pharynx normal without lesions Neck - supple, no significant adenopathy Chest - clear to auscultation, no wheezes, rales or rhonchi, symmetric air entry Heart - normal rate, regular rhythm, normal S1, S2, no murmurs, rubs, clicks or gallops Neurological - cranial nerves II through XII intact, motor and sensory grossly normal bilaterally.  Fine tremors of the hands noted on outstretched hands, on reaching for objects and on pointing. Extremities - peripheral pulses normal, no pedal edema, no clubbing or cyanosis   Depression screen [facility] 2/9 10/10/2018 08/30/2017 08/10/2017  Decreased Interest 2 3 3   Down, Depressed, Hopeless 2 2 3   PHQ - 2 Score 4 5 6   Altered sleeping 1 3 2   Tired, decreased energy 2 2 2   Change in appetite 1 1 (No Data)  Feeling bad or failure about yourself  1 2 2   Trouble concentrating 1 2 1   Moving slowly or fidgety/restless 0 1 1  Suicidal thoughts 1 1 2   PHQ-9 Score 11 17 16    EKG: Normal sinus rhythm.  Normal EKG.  ASSESSMENT AND PLAN: 1. Chest pain in adult Atypical.  However patient has risk factors for heart disease including hypertension and tobacco dependence.  Will refer to cardiology for further evaluation - EKG 12-Lead - Ambulatory referral to Cardiology  2. Intractable migraine without aura and with status  migrainosus We discussed diagnosis and treatment of migraine headaches.  We will use propranolol for prophylaxis which I think will also help with essential tremors. Given prednisone 20 mg daily for the next 4 days to try and knock out this episode of intractable headache that she has had for the past few weeks.   - propranolol (INDERAL) 10 MG tablet; Take 1 tablet (10 mg total) by mouth 2 (two) times daily.  Dispense: 60 tablet; Refill: 2 - predniSONE (DELTASONE) 20 MG tablet; Take 1 tablet (20 mg total) by mouth daily.  Dispense: 4 tablet; Refill: 0  3. Essential tremor Exam and history suggestive of essential tremor.  We will try with propranolol - propranolol (INDERAL) 10 MG tablet; Take 1 tablet (10 mg total) by mouth 2 (two) times daily.  Dispense: 60 tablet; Refill: 2  4. Tobacco dependence Patient advised to quit smoking. Discussed health risks associated with smoking including lung and other types of cancers, chronic lung diseases and CV risks.. Pt readyto give trail of quitting.  Discussed methods to help quit including quitting cold [place], use of NRT, Chantix and Bupropion.  She is willing to try the nicotine patches.  I went over with her the stepdown approach.  She will start with the 21 mg patches - nicotine (NICODERM CQ - DOSED IN MG/24 HOURS) 21 mg/24hr patch; Place 1 patch (21 mg total) onto the skin daily.  Dispense: 28 patch; Refill: 0 - nicotine (NICODERM CQ - DOSED IN MG/24 HOURS) 14 mg/24hr patch; Place 1 patch (14 mg total) onto the skin daily.  Dispense: 28 patch; Refill: 0  5. Depression, unspecified depression type LCSW to meet with patient today.  She is agreeable to trying Lexapro - escitalopram (LEXAPRO) 10 MG tablet; 1/2 tab PO daily x 1 wk  then 1 tab PO ddaily  Dispense: 30 tablet; Refill: 1   Patient will follow-up in 4 to 6 weeks for well woman exam.  We will also recheck to see how she is doing on the Lexapro at that time.  Patient was given the opportunity  to ask questions.  Patient verbalized understanding of the plan and was able to repeat key elements of the plan.   Orders Placed This Encounter  Procedures  . Ambulatory referral to Cardiology  . EKG 12-Lead     Requested Prescriptions   Signed Prescriptions Disp Refills  . propranolol (INDERAL) 10 MG tablet 60 tablet 2    Sig: Take 1 tablet (10 mg total) by mouth 2 (two) times daily.  . nicotine (NICODERM CQ - DOSED IN MG/24 HOURS) 21 mg/24hr patch 28 patch 0    Sig: Place 1 patch (21 mg total) onto the skin daily.  . nicotine (NICODERM CQ - DOSED IN MG/24 HOURS) 14 mg/24hr patch 28 patch 0    Sig: Place 1 patch (14 mg total) onto the skin daily.  . predniSONE (DELTASONE) 20 MG tablet 4 tablet 0    Sig: Take 1 tablet (20 mg total) by mouth daily.  Marland Kitchen escitalopram (LEXAPRO) 10 MG tablet 30 tablet 1    Sig: 1/2 tab PO daily x 1 wk then 1 tab PO ddaily    Return in about 1 month (around 11/09/2018) for PAP.  Karle Plumber, MD, FACP

## 2018-10-10 NOTE — BH Specialist Note (Signed)
Integrated Behavioral Health Initial Visit  MRN: [id number] Name: Sarah Randall  Number of [facility] Clinician visits:: 1/6 Session Start time: 4:25 PM  Session End time: 5:00 PM Total time: 35 minutes  Type of Service: [place] Interpretor:No. Interpretor Name and Language: N/A   Warm Hand Off Completed.       SUBJECTIVE: Sarah Randall is a 52 y.o. female accompanied by self Patient was referred by Dr. Wynetta Emery for depression and anxiety. Patient reports the following symptoms/concerns: Pt shared that she has ongoing medical conditions and is experiencing caregiver stress from both parents Duration of problem: Ongoing; Severity of problem: moderate  OBJECTIVE: Mood: Anxious and Depressed and Affect: Tearful Risk of harm to self or others: No plan to harm self or others  LIFE CONTEXT: Family and Social: Pt has an older sibling that resides locally. Pt resides alone School/Work: Pt receives disability ($1180) and food stamps ($15) Self-Care: Pt smoked cigarettes and an "occasional joint"  Life Changes: Pt has ongoing medical conditions and is experiencing caregiver stress  GOALS ADDRESSED: Patient will: 1. Reduce symptoms of: anxiety, depression and stress 2. Increase knowledge and/or ability of: coping skills and healthy habits  3. Demonstrate ability to: Increase healthy adjustment to current life circumstances and Increase adequate support systems for patient/family  INTERVENTIONS: Interventions utilized: Supportive Counseling, Psychoeducation and/or Health Education and Link to [employer]  Standardized Assessments completed: GAD-7 and PHQ 2&9  ASSESSMENT: Patient currently experiencing depression and anxiety triggered by ongoing medical conditions and is experiencing caregiver stress from both parents. She reports difficulty managing hypertension due to anxiety symptoms. She receives no support from  sibling to assist with elderly parents.   Patient may benefit from psychoeducation and psychotherapy. LCSWA educated pt on the correlation between one's physical and mental health, in addition to how stress can negatively impact both. Therapeutic interventions were discussed to promote relaxation and decrease symptoms. Pt is open to medication management through PCP. Behavioral health resources were provided.   PLAN: 1. Follow up with behavioral health clinician on : Pt was encouraged to contact LCSWA if symptoms worsen or fail to improve to schedule behavioral appointments at [facility]. 2. Behavioral recommendations: LCSWA recommends that pt apply healthy coping skills discussed, comply with medication management, and utilize provided resources. Pt is encouraged to schedule follow up appointment with LCSWA 3. Referral(s): [facility] (In Clinic) and [place] [facility] (LME/Outside Clinic) 4. "From scale of 1-10, how likely are you to follow plan?":   Rebekah Chesterfield, LCSW 10/14/18 3:41 PM

## 2018-10-10 NOTE — Patient Instructions (Signed)
Started nicotine patches 21 mg and use daily for 1 month.  After that he will step down to the 14 mg patch.  Start propranolol 10 mg daily.  This will help decrease the frequency of the migraine headaches.  It will also help decrease the tremors.  Essential Tremor A tremor is trembling or shaking that you cannot control. Most tremors affect the hands or arms. Tremors can also affect the head, vocal cords, face, and other parts of the body. Essential tremor is a tremor without a known cause. What are the causes? Essential tremor has no known cause. What increases the risk? You may be at greater risk of essential tremor if:  You have a family member with essential tremor.  You are age 39 or older.  You take certain medicines.  What are the signs or symptoms? The main sign of a tremor is uncontrolled and unintentional rhythmic shaking of a body part.  You may have difficulty eating with a spoon or fork.  You may have difficulty writing.  You may nod your head up and down or side to side.  You may have a quivering voice.  Your tremors:  May get worse over time.  May come and go.  May be more noticeable on one side of your body.  May get worse due to stress, fatigue, caffeine, and extreme heat or cold.  How is this diagnosed? Your health care provider can diagnose essential tremor based on your symptoms, medical history, and a physical examination. There is no single test to diagnose an essential tremor. However, your health care provider may perform a variety of tests to rule out other conditions. Tests may include:  Blood and urine tests.  Imaging studies of your brain, such as: ? CT scan. ? MRI.  A test that measures involuntary muscle movement (electromyogram).  How is this treated? Your tremors may go away without treatment. Mild tremors may not need treatment if they do not affect your day-to-day life. Severe tremors may need to be treated using one or a  combination of the following options:  Medicines. This may include medicine that is injected.  Lifestyle changes.  Physical therapy.  Follow these instructions at home:  Take medicines only as directed by your health care provider.  Limit alcohol intake to no more than 1 drink per day for nonpregnant women and 2 drinks per day for men. One drink equals 12 oz of beer, 5 oz of wine, or 1 oz of hard liquor.  Do not use any tobacco products, including cigarettes, chewing tobacco, or electronic cigarettes. If you need help quitting, ask your health care provider.  Take medicines only as directed by your health care provider.  Avoid extreme heat or cold.  Limit the amount of caffeine you consumeas directed by your health care provider.  Try to get eight hours of sleep each night.  Find ways to manage your stress, such as meditation or yoga.  Keep all follow-up visits as directed by your health care provider. This is important. This includes any physical therapy visits. Contact a health care provider if:  You experience any changes in the location or intensity of your tremors.  You start having a tremor after starting a new medicine.  You have tremor with other symptoms such as: ? Numbness. ? Tingling. ? Pain. ? Weakness.  Your tremor gets worse.  Your tremor interferes with your daily life. This information is not intended to replace advice given to you by your  health care provider. Make sure you discuss any questions you have with your health care provider. Document Released: 12/14/2014 Document Revised: 04/30/2016 Document Reviewed: 05/21/2014 Elsevier Interactive Patient Education  [employer].

## 2018-10-11 MED FILL — ESCITALOPRAM 10 MG TABLET: 10 | 34 days supply | Qty: 30 | Fill #0

## 2018-10-12 ENCOUNTER — Ambulatory Visit (INDEPENDENT_AMBULATORY_CARE_PROVIDER_SITE_OTHER): Payer: Self-pay

## 2018-10-12 ENCOUNTER — Ambulatory Visit (INDEPENDENT_AMBULATORY_CARE_PROVIDER_SITE_OTHER): Payer: Self-pay | Admitting: Orthopaedic Surgery

## 2018-10-12 DIAGNOSIS — Z96642 Presence of left artificial hip joint: Secondary | ICD-10-CM

## 2018-10-12 DIAGNOSIS — M7062 Trochanteric bursitis, left hip: Secondary | ICD-10-CM

## 2018-10-12 DIAGNOSIS — Z6841 Body Mass Index (BMI) 40.0 and over, adult: Secondary | ICD-10-CM

## 2018-10-12 MED ORDER — METHYLPREDNISOLONE 4 MG PO TBPK: ORAL_TABLET | ORAL | 0 refills | Status: DC

## 2018-10-12 NOTE — Progress Notes (Signed)
Office Visit Note   Patient: Sarah Randall           Date of Birth: Apr 30, 1966           MRN: [id number] Visit Date: 10/12/2018              Requested by: Ladell Pier, MD [address] [place], [place] [address] PCP: Ladell Pier, MD   Assessment & Plan: Visit Diagnoses:  1. History of left hip replacement   2. Trochanteric bursitis of left hip   3. Morbid obesity ([place])   4. Body mass index 45.0-49.9, adult (HCC)     Plan: Impression is 1 year out left total hip replacement with subsequent trochanteric bursitis versus lumbar radiculopathy.  At this point, we will start the patient on a steroid taper and start her in formal physical therapy for IT band stretching and core strengthening.  She will follow-up with [place] in 1 to 2 years for follow-up of her left total hip replacement. The patient meets the AMA guidelines for Morbid (severe) obesity with a BMI > 40.0 and I have recommended weight loss.   Follow-Up Instructions: Return for for right knee fu.   Orders:  Orders Placed This Encounter  Procedures  . XR HIP UNILAT W OR W/O PELVIS 1V LEFT   Meds ordered this encounter  Medications  . methylPREDNISolone (MEDROL DOSEPAK) 4 MG TBPK tablet    Sig: Take as directed    Dispense:  21 tablet    Refill:  0      Procedures: No procedures performed   Clinical Data: No additional findings.   Subjective: Chief Complaint  Patient presents with  . Left Hip - Follow-up    HPI patient is a pleasant 52 year old female who presents to our clinic today nearly 1 year out left anterior total hip replacement, date of surgery 09/22/2017.  She has been doing okay but still notes pain to the lateral hip.  She was seen in our office approximately 3 months ago where we injected her trochanteric bursa.  She notes moderate relief for approximately 2 weeks after this injection.  Her pain has returned and has started to worsen.  Review of Systems as detailed in HPI.  All  others reviewed and are negative.   Objective: Vital Signs: There were no vitals taken for this visit.  Physical Exam well-developed well-nourished female no acute distress.  Alert and oriented x3.  Ortho Exam examination of her left hip reveals a negative logroll.  Minimally positive straight leg raise.  Moderate tenderness trochanteric bursa she is neurovascular intact distally.  Specialty Comments:  No specialty comments available.  Imaging: Xr Hip Unilat W Or W/o Pelvis 1v Left  Result Date: 10/12/2018 X-rays demonstrate a well-seated prosthesis without evidence of subsidence    PMFS History: Patient Active Problem List   Diagnosis Date Noted  . Chronic right shoulder pain 07/05/2018  . Trochanteric bursitis of left hip 07/05/2018  . Morbid obesity ([place]) 07/05/2018  . Body mass index 45.0-49.9, adult ([place]) 07/05/2018  . Marijuana user 05/26/2018  . S/P total knee arthroplasty, right 01/12/2018  . History of hip replacement 09/22/2017  . Hyperlipidemia 08/10/2017  . Primary osteoarthritis of left hip 06/25/2017  . Primary osteoarthritis of right knee 06/25/2017  . Depression 06/25/2017  . Essential hypertension 06/25/2017  . Class 3 severe obesity due to excess calories without serious comorbidity with body mass index (BMI) of 45.0 to 49.9 in adult ([place]) 06/25/2017  .  Tobacco abuse 06/25/2017  . Prediabetes 06/25/2017  . OSA on CPAP 06/25/2017   Past Medical History:  Diagnosis Date  . Arthritis   . Depression   . GERD (gastroesophageal reflux disease)    occasionally takes OTC  . Hypertension   . Pre-diabetes   . Sleep apnea    tested 2014 - unable to tolerate machine    Family History  Problem Relation Age of Onset  . Hypertension Mother   . Diabetes Mother   . Hypertension Father   . Breast cancer Maternal Aunt   . Lung cancer Maternal Aunt     Past Surgical History:  Procedure Laterality Date  . TOTAL HIP ARTHROPLASTY Left 09/22/2017   Procedure:  LEFT TOTAL HIP ARTHROPLASTY ANTERIOR APPROACH;  Surgeon: Leandrew Koyanagi, MD;  Location: [facility];  Service: Orthopedics;  Laterality: Left;  . TOTAL KNEE ARTHROPLASTY Right 01/12/2018   Procedure: RIGHT TOTAL KNEE ARTHROPLASTY;  Surgeon: Leandrew Koyanagi, MD;  Location: [facility];  Service: Orthopedics;  Laterality: Right;  . TUBAL LIGATION     Social History   Occupational History  . Occupation: unemployed  Tobacco Use  . Smoking status: Current Some Day Smoker    Packs/day: 0.25    Years: 30.00    Pack years: 7.50    Types: Cigarettes  . Smokeless tobacco: Never Used  Substance and Sexual Activity  . Alcohol use: Yes    Comment: occasional  . Drug use: No    Comment: smoke 1 joint cannibis 1 week ago.  Marland Kitchen Sexual activity: Yes    Birth control/protection: None

## 2018-10-13 ENCOUNTER — Encounter: Payer: Self-pay | Admitting: Internal Medicine

## 2018-10-21 ENCOUNTER — Encounter: Payer: Self-pay | Admitting: Cardiology

## 2018-10-21 ENCOUNTER — Ambulatory Visit (HOSPITAL_BASED_OUTPATIENT_CLINIC_OR_DEPARTMENT_OTHER)
Admission: RE | Admit: 2018-10-21 | Discharge: 2018-10-21 | Disposition: A | Discharge status: Home / Self Care | Payer: Self-pay | Source: Ambulatory Visit | Attending: Cardiology | Admitting: Cardiology

## 2018-10-21 ENCOUNTER — Ambulatory Visit (INDEPENDENT_AMBULATORY_CARE_PROVIDER_SITE_OTHER): Payer: Self-pay | Admitting: Cardiology

## 2018-10-21 ENCOUNTER — Ambulatory Visit: Payer: Self-pay

## 2018-10-21 ENCOUNTER — Ambulatory Visit: Payer: Self-pay | Admitting: Cardiology

## 2018-10-21 VITALS — BP 122/70 | HR 87 | Ht 64.0 in | Wt 264.0 lb

## 2018-10-21 DIAGNOSIS — Z720 Tobacco use: Secondary | ICD-10-CM

## 2018-10-21 DIAGNOSIS — I209 Angina pectoris, unspecified: Secondary | ICD-10-CM | POA: Insufficient documentation

## 2018-10-21 DIAGNOSIS — Z6841 Body Mass Index (BMI) 40.0 and over, adult: Secondary | ICD-10-CM

## 2018-10-21 DIAGNOSIS — E088 Diabetes mellitus due to underlying condition with unspecified complications: Secondary | ICD-10-CM | POA: Insufficient documentation

## 2018-10-21 DIAGNOSIS — Z01818 Encounter for other preprocedural examination: Secondary | ICD-10-CM

## 2018-10-21 DIAGNOSIS — E782 Mixed hyperlipidemia: Secondary | ICD-10-CM

## 2018-10-21 DIAGNOSIS — I10 Essential (primary) hypertension: Secondary | ICD-10-CM

## 2018-10-21 IMAGING — DX DG CHEST 2V
2 series · 2 of 2 positions shown · non-contrast
Comparison: Radiographs [DATE].  CT [DATE].

CLINICAL DATA: Preoperative evaluation for stent placement.
Intermittent left chest and arm pain with shortness of breath for 2
weeks.

EXAM:
CHEST - 2 VIEW

[chest pa]
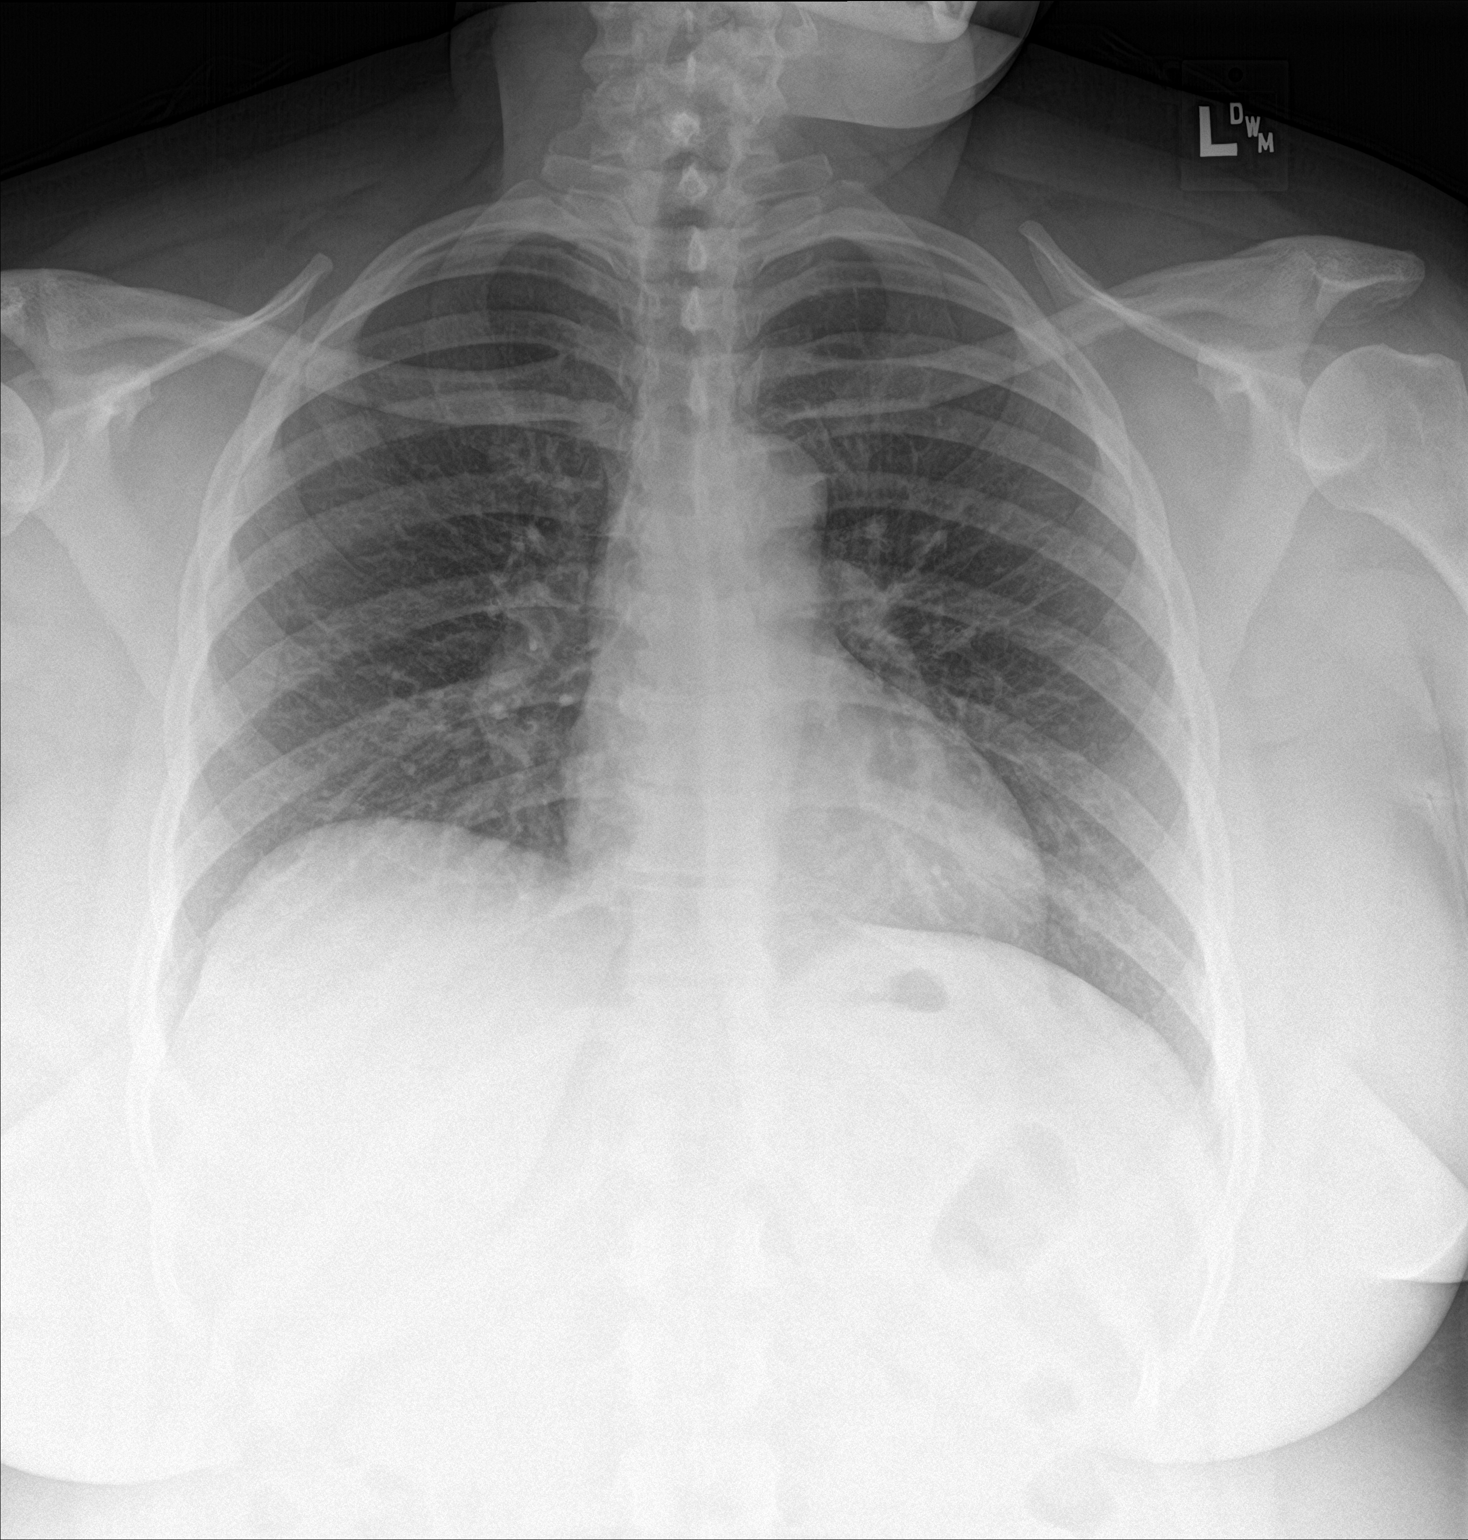

[chest lat]
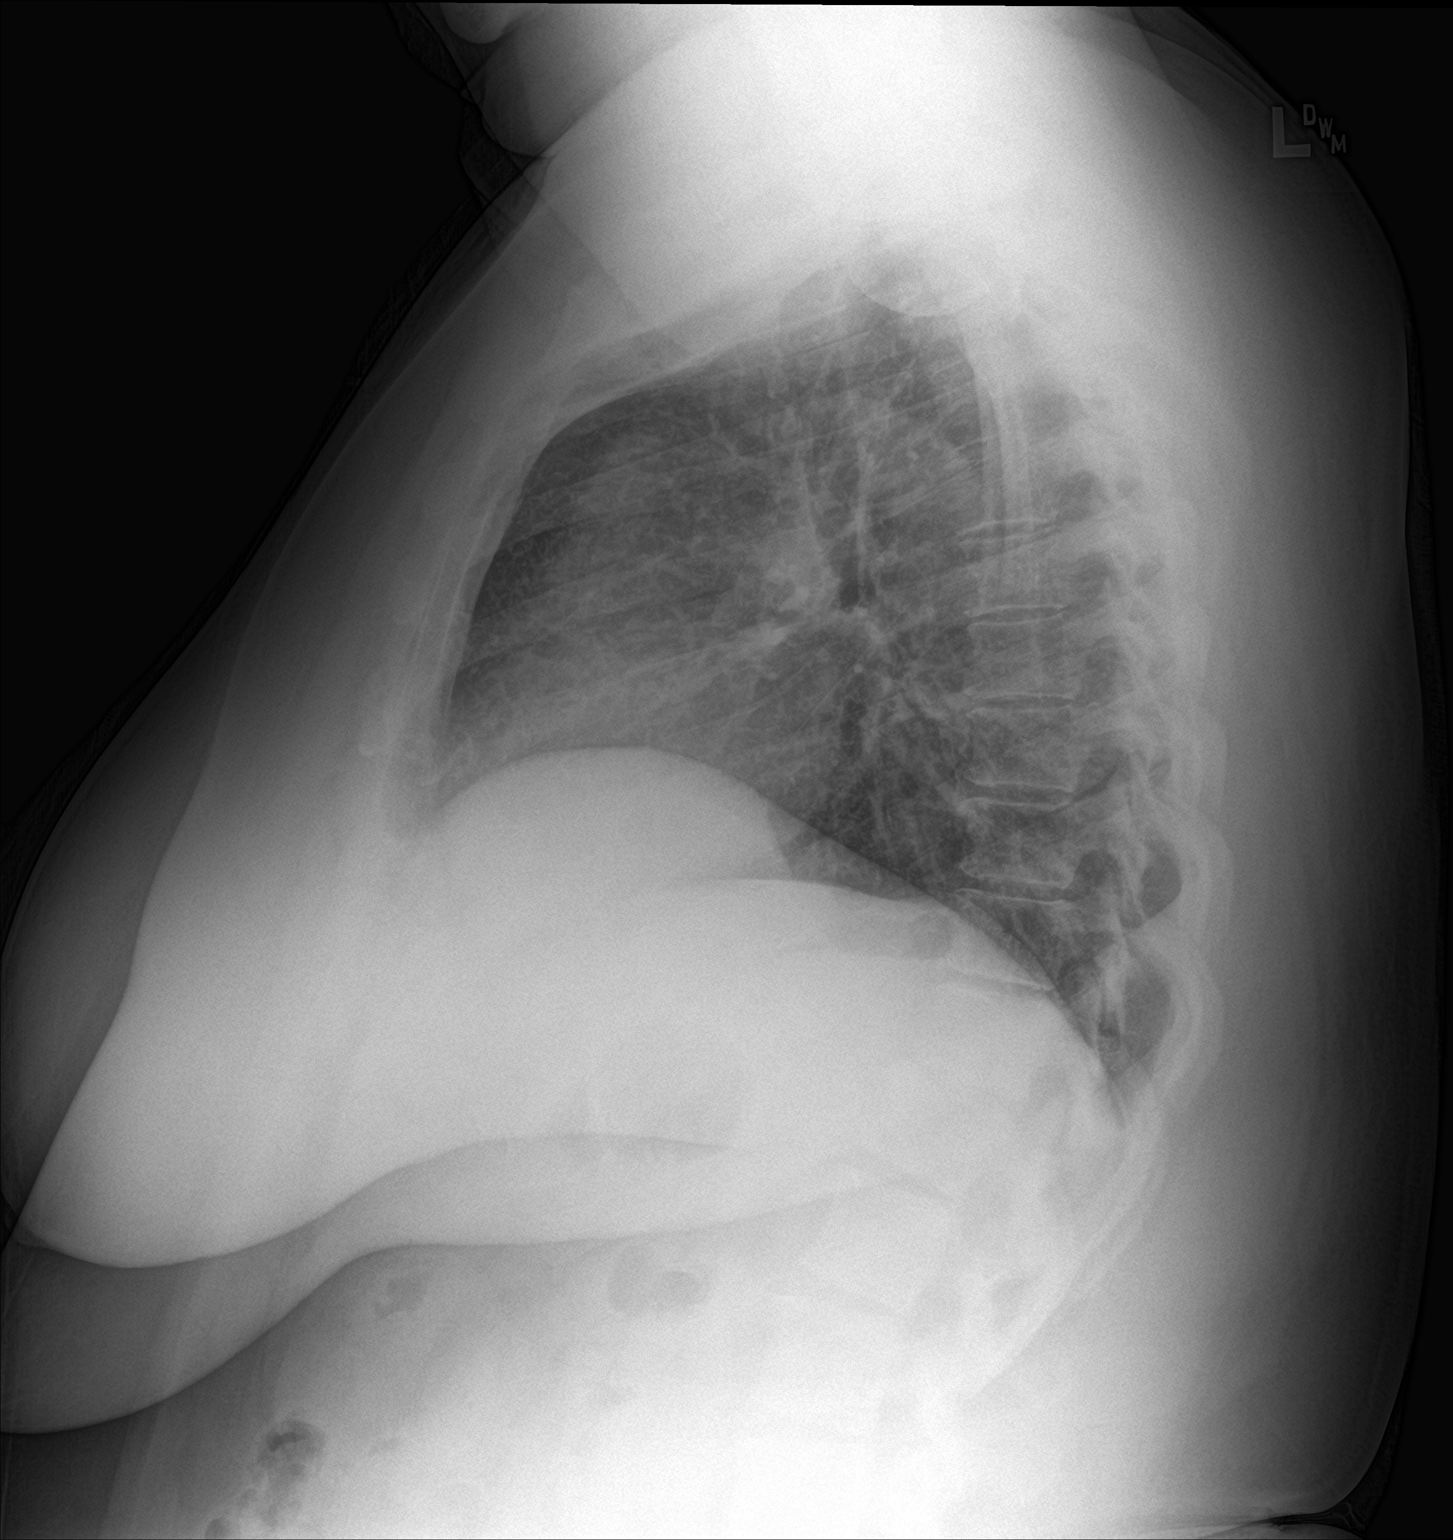

[2 of 2 positions shown; findings below may reference images not displayed]

FINDINGS: The heart size and mediastinal contours are stable. The lungs are
clear. There is no pleural effusion or pneumothorax. No acute
osseous findings are demonstrated.
IMPRESSION: Stable chest.  No active cardiopulmonary process.

## 2018-10-21 MED ORDER — ASPIRIN EC 81 MG PO TBEC: 81.0000 mg | DELAYED_RELEASE_TABLET | Freq: Every day | ORAL | 3 refills | Status: DC

## 2018-10-21 MED ORDER — NITROGLYCERIN 0.4 MG SL SUBL: 0.4000 mg | SUBLINGUAL_TABLET | SUBLINGUAL | 11 refills | Status: AC | PRN

## 2018-10-21 MED FILL — NITROGLYCERIN 0.4 MG TAB SL: 0.4 | 15 days supply | Qty: 25 | Fill #0

## 2018-10-21 NOTE — H&P (View-Only) (Signed)
Cardiology Office Note:    Date:  10/21/2018   ID:  Sarah Randall, DOB 1966/10/06, MRN [id number]  PCP:  Ladell Pier, MD  Cardiologist:  Jenean Lindau, MD   Referring MD: Ladell Pier, MD    ASSESSMENT:    1. Essential hypertension   2. Mixed hyperlipidemia   3. Tobacco abuse   4. Class 3 severe obesity due to excess calories without serious comorbidity with body mass index (BMI) of 45.0 to 49.9 in adult ([place])   5. Morbid obesity ([place])   6. Diabetes mellitus due to underlying condition with unspecified complications ([place])   7. Angina pectoris ([place])    PLAN:    In order of problems listed above:  1. Primary prevention stressed with the patient.  Importance of compliance with diet and medication stressed and she vocalized understanding.  Her blood pressure is stable.  Diet was discussed with dyslipidemia and obesity and risks of obesity explained and she vocalized understanding. 2. Sublingual nitroglycerin prescription was sent, its protocol and 911 protocol explained and the patient vocalized understanding questions were answered to the patient's satisfaction 3. I spent 5 minutes with the patient discussing solely about smoking. Smoking cessation was counseled. I suggested to the patient also different medications and pharmacological interventions. Patient is keen to try stopping on its own at this time. He will get back to me if he needs any further assistance in this matter. 4. In view of the patient's symptoms, I discussed with the patient options for evaluation. Invasive and noninvasive options were given to the patient. I discussed stress testing and coronary angiography and left heart catheterization at length. Benefits, pros and cons of each approach were discussed at length. Patient had multiple questions which were answered to the patient's satisfaction. Patient opted for invasive evaluation and we will set up for coronary angiography and left heart  catheterization. Further recommendations will be made based on the findings with coronary angiography. In the interim if the patient has any significant symptoms in hospital to the nearest emergency room.    Medication Adjustments/Labs and Tests Ordered: Current medicines are reviewed at length with the patient today.  Concerns regarding medicines are outlined above.  No orders of the defined types were placed in this encounter.  No orders of the defined types were placed in this encounter.    History of Present Illness:    Sarah Randall is a 52 y.o. female who is being seen today for the evaluation of chest tightness suggesting angina at the request of Ladell Pier, MD.  Patient is a pleasant 53 year old female.  She has past medical history of essential hypertension, dyslipidemia, morbid obesity and is an active heavy smoker.  She leads a sedentary lifestyle because of orthopedic issues.  She mentions to me that over the past several weeks she has been noticing substernal chest tightness.  What got her attention was that the past couple of times she has been having severe symptoms which occur sometimes on stress.  Please radiate to the left arm and her left arm goes numb.  No orthopnea or PND.  For this reason she is here for an evaluation.  At the time of my evaluation, the patient is alert awake oriented and in no distress.  Past Medical History:  Diagnosis Date  . Arthritis   . Depression   . GERD (gastroesophageal reflux disease)    occasionally takes OTC  . Hypertension   . Pre-diabetes   .  Sleep apnea    tested 2014 - unable to tolerate machine    Past Surgical History:  Procedure Laterality Date  . TOTAL HIP ARTHROPLASTY Left 09/22/2017   Procedure: LEFT TOTAL HIP ARTHROPLASTY ANTERIOR APPROACH;  Surgeon: Leandrew Koyanagi, MD;  Location: [facility];  Service: Orthopedics;  Laterality: Left;  . TOTAL KNEE ARTHROPLASTY Right 01/12/2018   Procedure: RIGHT TOTAL KNEE ARTHROPLASTY;   Surgeon: Leandrew Koyanagi, MD;  Location: [facility];  Service: Orthopedics;  Laterality: Right;  . TUBAL LIGATION      Current Medications: Current Meds  Medication Sig  . escitalopram (LEXAPRO) 10 MG tablet 1/2 tab PO daily x 1 wk then 1 tab PO ddaily  . esomeprazole (NEXIUM) 40 MG capsule Take 1 capsule (40 mg total) by mouth daily.  . hydrochlorothiazide (HYDRODIURIL) 25 MG tablet Take 1 tablet (25 mg total) by mouth daily.  Marland Kitchen losartan (COZAAR) 50 MG tablet Take 1 tablet (50 mg total) by mouth daily.  . meclizine (ANTIVERT) 25 MG tablet 1/2-1 tid prn dizziness  . methylPREDNISolone (MEDROL DOSEPAK) 4 MG TBPK tablet Take as directed  . nicotine (NICODERM CQ - DOSED IN MG/24 HOURS) 21 mg/24hr patch Place 1 patch (21 mg total) onto the skin daily.  . pravastatin (PRAVACHOL) 20 MG tablet 1 tab Q Mon/Wed and Fri  . propranolol (INDERAL) 10 MG tablet Take 1 tablet (10 mg total) by mouth 2 (two) times daily.     Allergies:   Lipitor [atorvastatin] and Zoloft [sertraline hcl]   Social History   Socioeconomic History  . Marital status: Single    Spouse name: Not on file  . Number of children: 2  . Years of education: 9  . Highest education level: Not on file  Occupational History  . Occupation: unemployed  Social Needs  . Financial resource strain: Not on file  . Food insecurity:    Worry: Not on file    Inability: Not on file  . Transportation needs:    Medical: Not on file    Non-medical: Not on file  Tobacco Use  . Smoking status: Current Some Day Smoker    Packs/day: 0.25    Years: 30.00    Pack years: 7.50    Types: Cigarettes  . Smokeless tobacco: Never Used  Substance and Sexual Activity  . Alcohol use: Yes    Comment: occasional  . Drug use: No    Comment: smoke 1 joint cannibis 1 week ago.  Marland Kitchen Sexual activity: Yes    Birth control/protection: None  Lifestyle  . Physical activity:    Days per week: Not on file    Minutes per session: Not on file  . Stress: Not on  file  Relationships  . Social connections:    Talks on phone: Not on file    Gets together: Not on file    Attends religious service: Not on file    Active member of club or organization: Not on file    Attends meetings of clubs or organizations: Not on file    Relationship status: Not on file  Other Topics Concern  . Not on file  Social History Narrative  . Not on file     Family History: The patient's family history includes Breast cancer in her maternal aunt; Diabetes in her mother; Hypertension in her father and mother; Lung cancer in her maternal aunt.  ROS:   Please see the history of present illness.    All other systems reviewed and are  negative.  EKGs/Labs/Other Studies Reviewed:    The following studies were reviewed today: I discussed my findings with the patient at extensive length.  EKG reveals sinus rhythm and nonspecific ST-T changes.   Recent Labs: 09/08/2018: ALT 14; BUN 15; Creatinine, Ser 1.00; Hemoglobin 15.5; Platelets 395; Potassium 4.3; Sodium 138; TSH 0.751  Recent Lipid Panel    Component Value Date/Time   CHOL 250 (H) 06/25/2017 1511   TRIG 262 (H) 06/25/2017 1511   HDL 40 06/25/2017 1511   CHOLHDL 6.3 (H) 06/25/2017 1511   LDLCALC 158 (H) 06/25/2017 1511    Physical Exam:    VS:  BP 122/70 (BP Location: Left Arm, Patient Position: Sitting, Cuff Size: Normal)   Pulse 87   Ht 5\' 4"  (1.626 m)   Wt 264 lb (119.7 kg)   SpO2 99%   BMI 45.32 kg/m     Wt Readings from Last 3 Encounters:  10/21/18 264 lb (119.7 kg)  10/10/18 273 lb 3.2 oz (123.9 kg)  09/08/18 269 lb 12.8 oz (122.4 kg)     GEN: Patient is in no acute distress HEENT: Normal NECK: No JVD; No carotid bruits LYMPHATICS: No lymphadenopathy CARDIAC: S1 S2 regular, 2/6 systolic murmur at the apex. RESPIRATORY:  Clear to auscultation without rales, wheezing or rhonchi  ABDOMEN: Soft, non-tender, non-distended MUSCULOSKELETAL:  No edema; No deformity  SKIN: Warm and  dry NEUROLOGIC:  Alert and oriented x 3 PSYCHIATRIC:  Normal affect    Signed, Jenean Lindau, MD  10/21/2018 11:14 AM    [place]

## 2018-10-21 NOTE — Addendum Note (Signed)
Addended by: Mattie Marlin on: 10/21/2018 11:39 AM   Modules accepted: Orders

## 2018-10-21 NOTE — Progress Notes (Signed)
Cardiology Office Note:    Date:  10/21/2018   ID:  Sarah Randall, DOB 09-Nov-1966, MRN [id number]  PCP:  Ladell Pier, MD  Cardiologist:  Jenean Lindau, MD   Referring MD: Ladell Pier, MD    ASSESSMENT:    1. Essential hypertension   2. Mixed hyperlipidemia   3. Tobacco abuse   4. Class 3 severe obesity due to excess calories without serious comorbidity with body mass index (BMI) of 45.0 to 49.9 in adult ([place])   5. Morbid obesity ([place])   6. Diabetes mellitus due to underlying condition with unspecified complications ([place])   7. Angina pectoris ([place])    PLAN:    In order of problems listed above:  1. Primary prevention stressed with the patient.  Importance of compliance with diet and medication stressed and she vocalized understanding.  Her blood pressure is stable.  Diet was discussed with dyslipidemia and obesity and risks of obesity explained and she vocalized understanding. 2. Sublingual nitroglycerin prescription was sent, its protocol and 911 protocol explained and the patient vocalized understanding questions were answered to the patient's satisfaction 3. I spent 5 minutes with the patient discussing solely about smoking. Smoking cessation was counseled. I suggested to the patient also different medications and pharmacological interventions. Patient is keen to try stopping on its own at this time. He will get back to me if he needs any further assistance in this matter. 4. In view of the patient's symptoms, I discussed with the patient options for evaluation. Invasive and noninvasive options were given to the patient. I discussed stress testing and coronary angiography and left heart catheterization at length. Benefits, pros and cons of each approach were discussed at length. Patient had multiple questions which were answered to the patient's satisfaction. Patient opted for invasive evaluation and we will set up for coronary angiography and left heart  catheterization. Further recommendations will be made based on the findings with coronary angiography. In the interim if the patient has any significant symptoms in hospital to the nearest emergency room.    Medication Adjustments/Labs and Tests Ordered: Current medicines are reviewed at length with the patient today.  Concerns regarding medicines are outlined above.  No orders of the defined types were placed in this encounter.  No orders of the defined types were placed in this encounter.    History of Present Illness:    Sarah Randall is a 52 y.o. female who is being seen today for the evaluation of chest tightness suggesting angina at the request of Ladell Pier, MD.  Patient is a pleasant 52 year old female.  She has past medical history of essential hypertension, dyslipidemia, morbid obesity and is an active heavy smoker.  She leads a sedentary lifestyle because of orthopedic issues.  She mentions to me that over the past several weeks she has been noticing substernal chest tightness.  What got her attention was that the past couple of times she has been having severe symptoms which occur sometimes on stress.  Please radiate to the left arm and her left arm goes numb.  No orthopnea or PND.  For this reason she is here for an evaluation.  At the time of my evaluation, the patient is alert awake oriented and in no distress.  Past Medical History:  Diagnosis Date  . Arthritis   . Depression   . GERD (gastroesophageal reflux disease)    occasionally takes OTC  . Hypertension   . Pre-diabetes   .  Sleep apnea    tested 2014 - unable to tolerate machine    Past Surgical History:  Procedure Laterality Date  . TOTAL HIP ARTHROPLASTY Left 09/22/2017   Procedure: LEFT TOTAL HIP ARTHROPLASTY ANTERIOR APPROACH;  Surgeon: Leandrew Koyanagi, MD;  Location: [facility];  Service: Orthopedics;  Laterality: Left;  . TOTAL KNEE ARTHROPLASTY Right 01/12/2018   Procedure: RIGHT TOTAL KNEE ARTHROPLASTY;   Surgeon: Leandrew Koyanagi, MD;  Location: [facility];  Service: Orthopedics;  Laterality: Right;  . TUBAL LIGATION      Current Medications: Current Meds  Medication Sig  . escitalopram (LEXAPRO) 10 MG tablet 1/2 tab PO daily x 1 wk then 1 tab PO ddaily  . esomeprazole (NEXIUM) 40 MG capsule Take 1 capsule (40 mg total) by mouth daily.  . hydrochlorothiazide (HYDRODIURIL) 25 MG tablet Take 1 tablet (25 mg total) by mouth daily.  Marland Kitchen losartan (COZAAR) 50 MG tablet Take 1 tablet (50 mg total) by mouth daily.  . meclizine (ANTIVERT) 25 MG tablet 1/2-1 tid prn dizziness  . methylPREDNISolone (MEDROL DOSEPAK) 4 MG TBPK tablet Take as directed  . nicotine (NICODERM CQ - DOSED IN MG/24 HOURS) 21 mg/24hr patch Place 1 patch (21 mg total) onto the skin daily.  . pravastatin (PRAVACHOL) 20 MG tablet 1 tab Q Mon/Wed and Fri  . propranolol (INDERAL) 10 MG tablet Take 1 tablet (10 mg total) by mouth 2 (two) times daily.     Allergies:   Lipitor [atorvastatin] and Zoloft [sertraline hcl]   Social History   Socioeconomic History  . Marital status: Single    Spouse name: Not on file  . Number of children: 2  . Years of education: 9  . Highest education level: Not on file  Occupational History  . Occupation: unemployed  Social Needs  . Financial resource strain: Not on file  . Food insecurity:    Worry: Not on file    Inability: Not on file  . Transportation needs:    Medical: Not on file    Non-medical: Not on file  Tobacco Use  . Smoking status: Current Some Day Smoker    Packs/day: 0.25    Years: 30.00    Pack years: 7.50    Types: Cigarettes  . Smokeless tobacco: Never Used  Substance and Sexual Activity  . Alcohol use: Yes    Comment: occasional  . Drug use: No    Comment: smoke 1 joint cannibis 1 week ago.  Marland Kitchen Sexual activity: Yes    Birth control/protection: None  Lifestyle  . Physical activity:    Days per week: Not on file    Minutes per session: Not on file  . Stress: Not on  file  Relationships  . Social connections:    Talks on phone: Not on file    Gets together: Not on file    Attends religious service: Not on file    Active member of club or organization: Not on file    Attends meetings of clubs or organizations: Not on file    Relationship status: Not on file  Other Topics Concern  . Not on file  Social History Narrative  . Not on file     Family History: The patient's family history includes Breast cancer in her maternal aunt; Diabetes in her mother; Hypertension in her father and mother; Lung cancer in her maternal aunt.  ROS:   Please see the history of present illness.    All other systems reviewed and are  negative.  EKGs/Labs/Other Studies Reviewed:    The following studies were reviewed today: I discussed my findings with the patient at extensive length.  EKG reveals sinus rhythm and nonspecific ST-T changes.   Recent Labs: 09/08/2018: ALT 14; BUN 15; Creatinine, Ser 1.00; Hemoglobin 15.5; Platelets 395; Potassium 4.3; Sodium 138; TSH 0.751  Recent Lipid Panel    Component Value Date/Time   CHOL 250 (H) 06/25/2017 1511   TRIG 262 (H) 06/25/2017 1511   HDL 40 06/25/2017 1511   CHOLHDL 6.3 (H) 06/25/2017 1511   LDLCALC 158 (H) 06/25/2017 1511    Physical Exam:    VS:  BP 122/70 (BP Location: Left Arm, Patient Position: Sitting, Cuff Size: Normal)   Pulse 87   Ht 5\' 4"  (1.626 m)   Wt 264 lb (119.7 kg)   SpO2 99%   BMI 45.32 kg/m     Wt Readings from Last 3 Encounters:  10/21/18 264 lb (119.7 kg)  10/10/18 273 lb 3.2 oz (123.9 kg)  09/08/18 269 lb 12.8 oz (122.4 kg)     GEN: Patient is in no acute distress HEENT: Normal NECK: No JVD; No carotid bruits LYMPHATICS: No lymphadenopathy CARDIAC: S1 S2 regular, 2/6 systolic murmur at the apex. RESPIRATORY:  Clear to auscultation without rales, wheezing or rhonchi  ABDOMEN: Soft, non-tender, non-distended MUSCULOSKELETAL:  No edema; No deformity  SKIN: Warm and  dry NEUROLOGIC:  Alert and oriented x 3 PSYCHIATRIC:  Normal affect    Signed, Jenean Lindau, MD  10/21/2018 11:14 AM    [place]

## 2018-10-21 NOTE — Patient Instructions (Signed)
Medication Instructions:  Your physician has recommended you make the following change in your medication:   START 81 mg enteric coated aspirin daily START Nitroglycerin 0.4 mg sublingual (under your tongue) as needed for chest pain. If experiencing chest pain, stop what you are doing and sit down. Take 1 nitroglycerin and wait 5 minutes. If chest pain continues, take another nitroglycerin and wait 5 minutes. If chest pain does not subside, take 1 more nitroglycerin and dial 911. You make take a total of 3 nitroglycerin in a 15 minute time frame.  If you need a refill on your cardiac medications before your next appointment, please call your pharmacy.   Lab work: Your physician recommends that you have the following labs drawn: BMP and CBC today.  If you have labs (blood work) drawn today and your tests are completely normal, you will receive your results only by: Marland Kitchen MyChart Message (if you have MyChart) OR . A paper copy in the mail If you have any lab test that is abnormal or we need to change your treatment, we will call you to review the results.  Testing/Procedures: A chest x-ray takes a picture of the organs and structures inside the chest, including the heart, lungs, and blood vessels. This test can show several things, including, whether the heart is enlarges; whether fluid is building up in the lungs; and whether pacemaker / defibrillator leads are still in place.     [address] [place], [place] [address] Dept: [phone] Loc: [facility]  10/21/2018  You are scheduled for a Cardiac Catheterization on Tuesday, November 19 with Dr. Shelva Majestic.  1. Please arrive at the [facility] (Main Entrance A) at [facility]: [address] [place], [place] [address] at 5:30 AM (This time is two hours before your procedure to ensure your preparation). Free  valet parking service is available.   Special note: Every effort is made to have your procedure done on time. Please understand that emergencies sometimes delay scheduled procedures.  2. Diet: Do not eat solid foods after midnight.  The patient may have clear liquids until 5am upon the day of the procedure.  3. Labs: Done today.  4. Medication instructions in preparation for your procedure:   Contrast Allergy: No  Stop taking, HTCZ (Hydrochlorothiazide) Tuesday, November 19,  On the morning of your procedure, take your Aspirin and any morning medicines NOT listed above.  You may use sips of water.  5. Plan for one night stay--bring personal belongings. 6. Bring a current list of your medications and current insurance cards. 7. You MUST have a responsible person to drive you home. 8. Someone MUST be with you the first 24 hours after you arrive home or your discharge will be delayed. 9. Please wear clothes that are easy to get on and off and wear slip-on shoes.  Thank you for allowing [place] to care for you!   -- [facility]   Follow-Up: At [facility], you and your health needs are our priority.  As part of our continuing mission to provide you with exceptional heart care, we have created designated Provider Care Teams.  These Care Teams include your primary Cardiologist (physician) and Advanced Practice Providers (APPs -  Physician Assistants and Nurse Practitioners) who all work together to provide you with the care you need, when you need it.  You will need  a follow up appointment in 4 weeks.  Please call our office 2 months in advance to schedule this appointment.  You may see another member of our [employer] Provider Team in [place]: Jenne Campus, MD . Shirlee More, MD  Any Other Special Instructions Will Be Listed Below (If Applicable).   Coronary Angiogram With Stent Coronary angiogram with stent placement is a procedure to widen  or open a narrow blood vessel of the heart (coronary artery). Arteries may become blocked by cholesterol buildup (plaques) in the lining of the wall. When a coronary artery becomes partially blocked, blood flow to that area decreases. This may lead to chest pain or a heart attack (myocardial infarction). A stent is a small piece of metal that looks like mesh or a spring. Stent placement may be done as treatment for a heart attack or right after a coronary angiogram in which a blocked artery is found. Let your health care provider know about:  Any allergies you have.  All medicines you are taking, including vitamins, herbs, eye drops, creams, and over-the-counter medicines.  Any problems you or family members have had with anesthetic medicines.  Any blood disorders you have.  Any surgeries you have had.  Any medical conditions you have.  Whether you are pregnant or may be pregnant. What are the risks? Generally, this is a safe procedure. However, problems may occur, including:  Damage to the heart or its blood vessels.  A return of blockage.  Bleeding, infection, or bruising at the insertion site.  A collection of blood under the skin (hematoma) at the insertion site.  A blood clot in another part of the body.  Kidney injury.  Allergic reaction to the dye or contrast that is used.  Bleeding into the abdomen (retroperitoneal bleeding).  What happens before the procedure? Staying hydrated Follow instructions from your health care provider about hydration, which may include:  Up to 2 hours before the procedure - you may continue to drink clear liquids, such as water, clear fruit juice, black coffee, and plain tea.  Eating and drinking restrictions Follow instructions from your health care provider about eating and drinking, which may include:  8 hours before the procedure - stop eating heavy meals or foods such as meat, fried foods, or fatty foods.  6 hours before the  procedure - stop eating light meals or foods, such as toast or cereal.  2 hours before the procedure - stop drinking clear liquids.  Ask your health care provider about:  Changing or stopping your regular medicines. This is especially important if you are taking diabetes medicines or blood thinners.  Taking medicines such as ibuprofen. These medicines can thin your blood. Do not take these medicines before your procedure if your health care provider instructs you not to. Generally, aspirin is recommended before a procedure of passing a small, thin tube (catheter) through a blood vessel and into the heart (cardiac catheterization).  What happens during the procedure?  An IV tube will be inserted into one of your veins.  You will be given one or more of the following: ? A medicine to help you relax (sedative). ? A medicine to numb the area where the catheter will be inserted into an artery (local anesthetic).  To reduce your risk of infection: ? Your health care team will wash or sanitize their hands. ? Your skin will be washed with soap. ? Hair may be removed from the area where the catheter will be inserted.  Using a guide wire, the catheter will be inserted into an artery. The location may be in your groin, in your wrist, or in the fold of your arm (near your elbow).  A type of X-ray (fluoroscopy) will be used to help guide the catheter to the opening of the arteries in the heart.  A dye will be injected into the catheter, and X-rays will be taken. The dye will help to show where any narrowing or blockages are located in the arteries.  A tiny wire will be guided to the blocked spot, and a balloon will be inflated to make the artery wider.  The stent will be expanded and will crush the plaques into the wall of the vessel. The stent will hold the area open and improve the blood flow. Most stents have a drug coating to reduce the risk of the stent narrowing over time.  The artery may  be made wider using a drill, laser, or other tools to remove plaques.  When the blood flow is better, the catheter will be removed. The lining of the artery will grow over the stent, which stays where it was placed. This procedure may vary among health care providers and hospitals. What happens after the procedure?  If the procedure is done through the leg, you will be kept in bed lying flat for about 6 hours. You will be instructed to not bend and not cross your legs.  The insertion site will be checked frequently.  The pulse in your foot or wrist will be checked frequently.  You may have additional blood tests, X-rays, and a test that records the electrical activity of your heart (electrocardiogram, or ECG). This information is not intended to replace advice given to you by your health care provider. Make sure you discuss any questions you have with your health care provider. Document Released: 05/30/2003 Document Revised: 07/23/2016 Document Reviewed: 06/28/2016 Elsevier Interactive Patient Education  2018 [employer]. Nitroglycerin sublingual tablets What is this medicine? NITROGLYCERIN (nye troe GLI ser in) is a type of vasodilator. It relaxes blood vessels, increasing the blood and oxygen supply to your heart. This medicine is used to relieve chest pain caused by angina. It is also used to prevent chest pain before activities like climbing stairs, going outdoors in cold weather, or sexual activity. This medicine may be used for other purposes; ask your health care provider or pharmacist if you have questions. COMMON BRAND NAME(S): Nitroquick, Nitrostat, Nitrotab What should I tell my health care provider before I take this medicine? They need to know if you have any of these conditions: -anemia -head injury, recent stroke, or bleeding in the brain -liver disease -previous heart attack -an unusual or allergic reaction to nitroglycerin, other medicines, foods, dyes, or  preservatives -pregnant or trying to get pregnant -breast-feeding How should I use this medicine? Take this medicine by mouth as needed. At the first sign of an angina attack (chest pain or tightness) place one tablet under your tongue. You can also take this medicine 5 to 10 minutes before an event likely to produce chest pain. Follow the directions on the prescription label. Let the tablet dissolve under the tongue. Do not swallow whole. Replace the dose if you accidentally swallow it. It will help if your mouth is not dry. Saliva around the tablet will help it to dissolve more quickly. Do not eat or drink, smoke or chew tobacco while a tablet is dissolving. If you are not better within 5 minutes after  taking ONE dose of nitroglycerin, call 9-1-1 immediately to seek emergency medical care. Do not take more than 3 nitroglycerin tablets over 15 minutes. If you take this medicine often to relieve symptoms of angina, your doctor or health care professional may provide you with different instructions to manage your symptoms. If symptoms do not go away after following these instructions, it is important to call 9-1-1 immediately. Do not take more than 3 nitroglycerin tablets over 15 minutes. Talk to your pediatrician regarding the use of this medicine in children. Special care may be needed. Overdosage: If you think you have taken too much of this medicine contact a poison control center or emergency room at once. NOTE: This medicine is only for you. Do not share this medicine with others. What if I miss a dose? This does not apply. This medicine is only used as needed. What may interact with this medicine? Do not take this medicine with any of the following medications: -certain migraine medicines like ergotamine and dihydroergotamine (DHE) -medicines used to treat erectile dysfunction like sildenafil, tadalafil, and vardenafil -riociguat This medicine may also interact with the following  medications: -alteplase -aspirin -heparin -medicines for high blood pressure -medicines for mental depression -other medicines used to treat angina -phenothiazines like chlorpromazine, mesoridazine, prochlorperazine, thioridazine This list may not describe all possible interactions. Give your health care provider a list of all the medicines, herbs, non-prescription drugs, or dietary supplements you use. Also tell them if you smoke, drink alcohol, or use illegal drugs. Some items may interact with your medicine. What should I watch for while using this medicine? Tell your doctor or health care professional if you feel your medicine is no longer working. Keep this medicine with you at all times. Sit or lie down when you take your medicine to prevent falling if you feel dizzy or faint after using it. Try to remain calm. This will help you to feel better faster. If you feel dizzy, take several deep breaths and lie down with your feet propped up, or bend forward with your head resting between your knees. You may get drowsy or dizzy. Do not drive, use machinery, or do anything that needs mental alertness until you know how this drug affects you. Do not stand or sit up quickly, especially if you are an older patient. This reduces the risk of dizzy or fainting spells. Alcohol can make you more drowsy and dizzy. Avoid alcoholic drinks. Do not treat yourself for coughs, colds, or pain while you are taking this medicine without asking your doctor or health care professional for advice. Some ingredients may increase your blood pressure. What side effects may I notice from receiving this medicine? Side effects that you should report to your doctor or health care professional as soon as possible: -blurred vision -dry mouth -skin rash -sweating -the feeling of extreme pressure in the head -unusually weak or tired Side effects that usually do not require medical attention (report to your doctor or health care  professional if they continue or are bothersome): -flushing of the face or neck -headache -irregular heartbeat, palpitations -nausea, vomiting This list may not describe all possible side effects. Call your doctor for medical advice about side effects. You may report side effects to FDA at 1-800-FDA-1088. Where should I keep my medicine? Keep out of the reach of children. Store at room temperature between 20 and 25 degrees C (68 and 77 degrees F). Store in [employer]. Protect from light and moisture. Keep tightly closed. Throw  away any unused medicine after the expiration date. NOTE: This sheet is a summary. It may not cover all possible information. If you have questions about this medicine, talk to your doctor, pharmacist, or health care provider.  2018 Elsevier/Gold Standard (2013-09-21 17:57:36)  Aspirin and Your Heart Aspirin is a medicine that affects the way blood clots. Aspirin can be used to help reduce the risk of blood clots, heart attacks, and other heart-related problems. Should I take aspirin? Your health care provider will help you determine whether it is safe and beneficial for you to take aspirin daily. Taking aspirin daily may be beneficial if you:  Have had a heart attack or chest pain.  Have undergone open heart surgery such as coronary artery bypass surgery (CABG).  Have had coronary angioplasty.  Have experienced a stroke or transient ischemic attack (TIA).  Have peripheral vascular disease (PVD).  Have chronic heart rhythm problems such as atrial fibrillation.  Are there any risks of taking aspirin daily? Daily use of aspirin can increase your risk of side effects. Some of these include:  Bleeding. Bleeding problems can be minor or serious. An example of a minor problem is a cut that does not stop bleeding. An example of a more serious problem is stomach bleeding or bleeding into the brain. Your risk of bleeding is increased if you are also taking  non-steroidal anti-inflammatory medicine (NSAIDs).  Increased bruising.  Upset stomach.  An allergic reaction. People who have nasal polyps have an increased risk of developing an aspirin allergy.  What are some guidelines I should follow when taking aspirin?  Take aspirin only as directed by your health care provider. Make sure you understand how much you should take and what form you should take. The two forms of aspirin are: ? Non-enteric-coated. This type of aspirin does not have a coating and is absorbed quickly. Non-enteric-coated aspirin is usually recommended for people with chest pain. This type of aspirin also comes in a chewable form. ? Enteric-coated. This type of aspirin has a special coating that releases the medicine very slowly. Enteric-coated aspirin causes less stomach upset than non-enteric-coated aspirin. This type of aspirin should not be chewed or crushed.  Drink alcohol in moderation. Drinking alcohol increases your risk of bleeding. When should I seek medical care?  You have unusual bleeding or bruising.  You have stomach pain.  You have an allergic reaction. Symptoms of an allergic reaction include: ? Hives. ? Itchy skin. ? Swelling of the lips, tongue, or face.  You have ringing in your ears. When should I seek immediate medical care?  Your bowel movements are bloody, dark red, or black in color.  You vomit or cough up blood.  You have blood in your urine.  You cough, wheeze, or feel short of breath. If you have any of the following symptoms, this is an emergency. Do not wait to see if the pain will go away. Get medical help at once. Call your local emergency services (911 in the [place] Do not drive yourself to the hospital.  You have severe chest pain, especially if the pain is crushing or pressure-like and spreads to the arms, back, neck, or jaw.  You have stroke-like symptoms, such as: ? Loss of vision. ? Difficulty talking. ? Numbness or  weakness on one side of your body. ? Numbness or weakness in your arm or leg. ? Not thinking clearly or feeling confused.  This information is not intended to replace advice given to you  by your health care provider. Make sure you discuss any questions you have with your health care provider. Document Released: 11/05/2008 Document Revised: 04/01/2016 Document Reviewed: 02/28/2014 Elsevier Interactive Patient Education  2018 [employer].

## 2018-10-22 LAB — BASIC METABOLIC PANEL
BUN/Creatinine Ratio: 12 (ref 9–23)
BUN: 11 mg/dL (ref 6–24)
CO2: 21 mmol/L (ref 20–29)
Calcium: 9.9 mg/dL (ref 8.7–10.2)
Chloride: 98 mmol/L (ref 96–106)
Creatinine, Ser: 0.9 mg/dL (ref 0.57–1.00)
GFR calc Af Amer: 85 mL/min/{1.73_m2} (ref >59)
GFR calc non Af Amer: 74 mL/min/{1.73_m2} (ref >59)
Glucose: 106 mg/dL — ABNORMAL HIGH (ref 65–99)
Potassium: 3.6 mmol/L (ref 3.5–5.2)
Sodium: 135 mmol/L (ref 134–144)

## 2018-10-22 LAB — CBC WITH DIFFERENTIAL/PLATELET
Basophils Absolute: 0.1 10*3/uL (ref 0.0–0.2)
Basos: 1 %
EOS (ABSOLUTE): 0.3 10*3/uL (ref 0.0–0.4)
Eos: 1 %
Hematocrit: 46.3 % (ref 34.0–46.6)
Hemoglobin: 15.8 g/dL (ref 11.1–15.9)
Immature Grans (Abs): 0.1 10*3/uL (ref 0.0–0.1)
Immature Granulocytes: 1 %
Lymphocytes Absolute: 6 10*3/uL — ABNORMAL HIGH (ref 0.7–3.1)
Lymphs: 34 %
MCH: 31.2 pg (ref 26.6–33.0)
MCHC: 34.1 g/dL (ref 31.5–35.7)
MCV: 91 fL (ref 79–97)
Monocytes Absolute: 0.9 10*3/uL (ref 0.1–0.9)
Monocytes: 5 %
Neutrophils Absolute: 10.1 10*3/uL — ABNORMAL HIGH (ref 1.4–7.0)
Neutrophils: 58 %
Platelets: 406 10*3/uL (ref 150–450)
RBC: 5.07 x10E6/uL (ref 3.77–5.28)
RDW: 12.9 % (ref 12.3–15.4)
WBC: 17.5 10*3/uL — ABNORMAL HIGH (ref 3.4–10.8)

## 2018-10-24 ENCOUNTER — Encounter: Payer: Self-pay | Admitting: Internal Medicine

## 2018-10-24 ENCOUNTER — Telehealth: Payer: Self-pay

## 2018-10-24 ENCOUNTER — Ambulatory Visit: Payer: Self-pay | Attending: Internal Medicine | Admitting: Internal Medicine

## 2018-10-24 VITALS — BP 133/86 | HR 65 | Temp 98.2°F | Resp 16 | Wt 264.8 lb

## 2018-10-24 DIAGNOSIS — G4733 Obstructive sleep apnea (adult) (pediatric): Secondary | ICD-10-CM | POA: Insufficient documentation

## 2018-10-24 DIAGNOSIS — R358 Other polyuria: Secondary | ICD-10-CM | POA: Insufficient documentation

## 2018-10-24 DIAGNOSIS — D72828 Other elevated white blood cell count: Secondary | ICD-10-CM

## 2018-10-24 DIAGNOSIS — Z79899 Other long term (current) drug therapy: Secondary | ICD-10-CM | POA: Insufficient documentation

## 2018-10-24 DIAGNOSIS — E785 Hyperlipidemia, unspecified: Secondary | ICD-10-CM | POA: Insufficient documentation

## 2018-10-24 DIAGNOSIS — Z888 Allergy status to other drugs, medicaments and biological substances status: Secondary | ICD-10-CM | POA: Insufficient documentation

## 2018-10-24 DIAGNOSIS — R3589 Other polyuria: Secondary | ICD-10-CM

## 2018-10-24 DIAGNOSIS — R079 Chest pain, unspecified: Secondary | ICD-10-CM

## 2018-10-24 DIAGNOSIS — I10 Essential (primary) hypertension: Secondary | ICD-10-CM | POA: Insufficient documentation

## 2018-10-24 DIAGNOSIS — Z6841 Body Mass Index (BMI) 40.0 and over, adult: Secondary | ICD-10-CM | POA: Insufficient documentation

## 2018-10-24 DIAGNOSIS — D72829 Elevated white blood cell count, unspecified: Secondary | ICD-10-CM

## 2018-10-24 DIAGNOSIS — E119 Type 2 diabetes mellitus without complications: Secondary | ICD-10-CM | POA: Insufficient documentation

## 2018-10-24 DIAGNOSIS — F329 Major depressive disorder, single episode, unspecified: Secondary | ICD-10-CM | POA: Insufficient documentation

## 2018-10-24 DIAGNOSIS — Z833 Family history of diabetes mellitus: Secondary | ICD-10-CM | POA: Insufficient documentation

## 2018-10-24 DIAGNOSIS — Z7982 Long term (current) use of aspirin: Secondary | ICD-10-CM | POA: Insufficient documentation

## 2018-10-24 DIAGNOSIS — Z96642 Presence of left artificial hip joint: Secondary | ICD-10-CM | POA: Insufficient documentation

## 2018-10-24 DIAGNOSIS — R0789 Other chest pain: Secondary | ICD-10-CM

## 2018-10-24 DIAGNOSIS — Z96651 Presence of right artificial knee joint: Secondary | ICD-10-CM | POA: Insufficient documentation

## 2018-10-24 DIAGNOSIS — Z8249 Family history of ischemic heart disease and other diseases of the circulatory system: Secondary | ICD-10-CM | POA: Insufficient documentation

## 2018-10-24 DIAGNOSIS — F17210 Nicotine dependence, cigarettes, uncomplicated: Secondary | ICD-10-CM | POA: Insufficient documentation

## 2018-10-24 LAB — POCT URINALYSIS DIP (CLINITEK)
Bilirubin, UA: NEGATIVE
Glucose, UA: NEGATIVE mg/dL
Ketones, POC UA: NEGATIVE mg/dL
Nitrite, UA: NEGATIVE
POC PROTEIN,UA: NEGATIVE
Spec Grav, UA: 1.015 (ref 1.010–1.025)
Urobilinogen, UA: 0.2 E.U./dL
pH, UA: 7 (ref 5.0–8.0)

## 2018-10-24 LAB — CBC WITH DIFFERENTIAL/PLATELET
Basophils Absolute: 0.1 10*3/uL (ref 0.0–0.2)
Basos: 0 %
EOS (ABSOLUTE): 0.3 10*3/uL (ref 0.0–0.4)
Eos: 2 %
Hematocrit: 47 % — ABNORMAL HIGH (ref 34.0–46.6)
Hemoglobin: 16.2 g/dL — ABNORMAL HIGH (ref 11.1–15.9)
Lymphocytes Absolute: 5.5 10*3/uL — ABNORMAL HIGH (ref 0.7–3.1)
Lymphs: 35 %
MCH: 31.3 pg (ref 26.6–33.0)
MCHC: 34.5 g/dL (ref 31.5–35.7)
MCV: 91 fL (ref 79–97)
Monocytes Absolute: 1 10*3/uL — ABNORMAL HIGH (ref 0.1–0.9)
Monocytes: 7 %
Neutrophils Absolute: 8.8 10*3/uL — ABNORMAL HIGH (ref 1.4–7.0)
Neutrophils: 56 %
Platelets: 410 10*3/uL (ref 150–450)
RBC: 5.17 x10E6/uL (ref 3.77–5.28)
RDW: 14 % (ref 12.3–15.4)
WBC: 15.7 10*3/uL — ABNORMAL HIGH (ref 3.4–10.8)

## 2018-10-24 NOTE — Progress Notes (Signed)
Patient ID: Sarah Randall, female    DOB: 02/24/1966  MRN: [id number]  CC: Follow-up   Subjective: Sarah Randall is a 52 y.o. female who presents for urgent care visit Her concerns today include:  Pt with hx of HTN,pre-DM, tob abuse, OSA has CPAP but not using, obesity,GERD,depression, obesity,DDDoflumbar spine, OA RT hip and LT knees/p jt replacements  Patient was referred to cardiology for evaluation of chest pain.  She was seen and cardiac cath was recommended.  This is scheduled for tomorrow.  However presurgical labs done on the 15th of this month revealed elevated WBC with inc neutrophil. Cardiologist wanted her to be seen today to have this addressed.  pt denies fever, cough, sore throat, swollen glans.  Urinating a little bit more but no dysuria or hematuria.   Had diarrhea about 1 wk ago.  No N/V/abdominal pain wgh down 9 lbs since 10/10/2018 but I question whether this is correct as previous and subsequent weights have been in the 260s.  Patient does not feel in her close that she has lost 9 pounds. Given 4 days of Prednisone on last visit to abort intractable migraine.  She was done with this around the eighth or ninth of this month. Patient Active Problem List   Diagnosis Date Noted  . Diabetes mellitus due to underlying condition with unspecified complications ([place]) 09/32/6712  . Angina pectoris ([place]) 10/21/2018  . Chronic right shoulder pain 07/05/2018  . Trochanteric bursitis of left hip 07/05/2018  . Morbid obesity ([place]) 07/05/2018  . Body mass index 45.0-49.9, adult ([place]) 07/05/2018  . Marijuana user 05/26/2018  . S/P total knee arthroplasty, right 01/12/2018  . History of hip replacement 09/22/2017  . Hyperlipidemia 08/10/2017  . Primary osteoarthritis of left hip 06/25/2017  . Primary osteoarthritis of right knee 06/25/2017  . Depression 06/25/2017  . Essential hypertension 06/25/2017  . Tobacco abuse 06/25/2017  . Prediabetes 06/25/2017  . OSA on CPAP  06/25/2017  . Bronchitis 09/24/2016  . Left-sided chest pain 09/24/2016  . Leukocytosis 09/24/2016  . Lumbar radicular pain 07/16/2016  . Degenerative lumbar disc 07/03/2016  . Synovitis of hip 04/20/2016  . Screening for breast cancer 12/28/2014  . Type II diabetes mellitus ([place]) 04/06/2014  . Pain in joint involving lower leg 05/13/2013     Current Outpatient Medications on File Prior to Visit  Medication Sig Dispense Refill  . aspirin EC 81 MG tablet Take 1 tablet (81 mg total) by mouth daily. 90 tablet 3  . escitalopram (LEXAPRO) 10 MG tablet 1/2 tab PO daily x 1 wk then 1 tab PO ddaily (Patient taking differently: Take 10 mg by mouth daily. ) 30 tablet 1  . esomeprazole (NEXIUM) 40 MG capsule Take 1 capsule (40 mg total) by mouth daily. 30 capsule 3  . hydrochlorothiazide (HYDRODIURIL) 25 MG tablet Take 1 tablet (25 mg total) by mouth daily. 30 tablet 2  . losartan (COZAAR) 50 MG tablet Take 1 tablet (50 mg total) by mouth daily. 30 tablet 6  . meclizine (ANTIVERT) 25 MG tablet 1/2-1 tid prn dizziness (Patient taking differently: Take 25 mg by mouth 3 (three) times daily as needed for dizziness. 1 tid prn dizziness) 30 tablet 0  . nicotine (NICODERM CQ - DOSED IN MG/24 HOURS) 21 mg/24hr patch Place 1 patch (21 mg total) onto the skin daily. (Patient not taking: Reported on 10/21/2018) 28 patch 0  . nitroGLYCERIN (NITROSTAT) 0.4 MG SL tablet Place 1 tablet (0.4 mg total) under the tongue  every 5 (five) minutes as needed. 25 tablet 11  . pravastatin (PRAVACHOL) 20 MG tablet 1 tab Q Mon/Wed and Fri (Patient taking differently: Take 20 mg by mouth every Monday, Wednesday, and Friday. 1 tab Q Mon/Wed and Fri) 30 tablet 3  . propranolol (INDERAL) 10 MG tablet Take 1 tablet (10 mg total) by mouth 2 (two) times daily. 60 tablet 2   No current facility-administered medications on file prior to visit.     Allergies  Allergen Reactions  . Lipitor [Atorvastatin] Other (See Comments)     Stabbing pains in legs  . Zoloft [Sertraline Hcl] Other (See Comments)    tremors    Social History   Socioeconomic History  . Marital status: Single    Spouse name: Not on file  . Number of children: 2  . Years of education: 9  . Highest education level: Not on file  Occupational History  . Occupation: unemployed  Social Needs  . Financial resource strain: Not on file  . Food insecurity:    Worry: Not on file    Inability: Not on file  . Transportation needs:    Medical: Not on file    Non-medical: Not on file  Tobacco Use  . Smoking status: Current Some Day Smoker    Packs/day: 0.25    Years: 30.00    Pack years: 7.50    Types: Cigarettes  . Smokeless tobacco: Never Used  Substance and Sexual Activity  . Alcohol use: Yes    Comment: occasional  . Drug use: No    Comment: smoke 1 joint cannibis 1 week ago.  Marland Kitchen Sexual activity: Yes    Birth control/protection: None  Lifestyle  . Physical activity:    Days per week: Not on file    Minutes per session: Not on file  . Stress: Not on file  Relationships  . Social connections:    Talks on phone: Not on file    Gets together: Not on file    Attends religious service: Not on file    Active member of club or organization: Not on file    Attends meetings of clubs or organizations: Not on file    Relationship status: Not on file  . Intimate partner violence:    Fear of current or ex partner: Not on file    Emotionally abused: Not on file    Physically abused: Not on file    Forced sexual activity: Not on file  Other Topics Concern  . Not on file  Social History Narrative  . Not on file    Family History  Problem Relation Age of Onset  . Hypertension Mother   . Diabetes Mother   . Hypertension Father   . Breast cancer Maternal Aunt   . Lung cancer Maternal Aunt     Past Surgical History:  Procedure Laterality Date  . TOTAL HIP ARTHROPLASTY Left 09/22/2017   Procedure: LEFT TOTAL HIP ARTHROPLASTY ANTERIOR  APPROACH;  Surgeon: Leandrew Koyanagi, MD;  Location: [facility];  Service: Orthopedics;  Laterality: Left;  . TOTAL KNEE ARTHROPLASTY Right 01/12/2018   Procedure: RIGHT TOTAL KNEE ARTHROPLASTY;  Surgeon: Leandrew Koyanagi, MD;  Location: [facility];  Service: Orthopedics;  Laterality: Right;  . TUBAL LIGATION      ROS: Review of Systems Negative except as above.    PHYSICAL EXAM: BP 133/86   Pulse 65   Temp 98.2 F (36.8 C) (Oral)   Resp 16   Wt 264 lb  12.8 oz (120.1 kg)   SpO2 97%   BMI 45.45 kg/m   Wt Readings from Last 3 Encounters:  10/24/18 264 lb 12.8 oz (120.1 kg)  10/21/18 264 lb (119.7 kg)  10/10/18 273 lb 3.2 oz (123.9 kg)    Physical Exam  General appearance - alert, well appearing, and in no distress Mental status - normal mood, behavior, speech, dress, motor activity, and thought processes Nose - normal and patent, no erythema, discharge or polyps Mouth - mucous membranes moist, pharynx normal without lesions Neck - supple, no significant adenopathy Chest - clear to auscultation, no wheezes, rales or rhonchi, symmetric air entry Heart - normal rate, regular rhythm, normal S1, S2, no murmurs, rubs, clicks or gallops Abdomen - soft, nontender, nondistended, no masses or organomegaly Extremities - peripheral pulses normal, no pedal edema, no clubbing or cyanosis LN: No cervical or axillary lymphadenopathy Results for orders placed or performed in visit on 10/24/18  POCT URINALYSIS DIP (CLINITEK)  Result Value Ref Range   Color, UA yellow yellow   Clarity, UA clear clear   Glucose, UA negative negative mg/dL   Bilirubin, UA negative negative   Ketones, POC UA negative negative mg/dL   Spec Grav, UA 1.015 1.010 - 1.025   Blood, UA small (A) negative   pH, UA 7.0 5.0 - 8.0   POC PROTEIN,UA negative negative, trace   Urobilinogen, UA 0.2 0.2 or 1.0 E.U./dL   Nitrite, UA Negative Negative   Leukocytes, UA Small (1+) (A) Negative    ASSESSMENT AND PLAN: 1. Leukocytosis,  unspecified type 2. Polyuria Patient is not toxic appearing and history does not point an underlying infection.  UA revealed small leukocytes but patient does not report any dysuria or hematuria.  Repeat CBC revealed decrease in WBC but still elevated at 15.7 with neutrophils of 8.8.  I would like to repeat CBC again in 1 wk.  I have called and spoken with the patient and with the cardiologist.  We have agreed that she can have the procedure tomorrow.  - CBC with Differential/Platelet; Future - CBC with Differential/Platelet - POCT URINALYSIS DIP (CLINITEK)  3. Chest pain in adult      Patient was given the opportunity to ask questions.  Patient verbalized understanding of the plan and was able to repeat key elements of the plan.   No orders of the defined types were placed in this encounter.    Requested Prescriptions    No prescriptions requested or ordered in this encounter    No follow-ups on file.  Karle Plumber, MD, FACP

## 2018-10-24 NOTE — Telephone Encounter (Signed)
Held on the phone with PCP office for over 30 mins with no answer. Instructed the patient to call the PCP office as well. Letter has been faxed to the office req that they see the patient asap for clearance tomorrow.

## 2018-10-25 ENCOUNTER — Encounter (HOSPITAL_COMMUNITY)
Admission: RE | Disposition: A | Discharge status: Home / Self Care | Payer: Self-pay | Source: Ambulatory Visit | Attending: Cardiology

## 2018-10-25 ENCOUNTER — Ambulatory Visit (HOSPITAL_COMMUNITY)
Admission: RE | Admit: 2018-10-25 | Discharge: 2018-10-25 | Disposition: A | Discharge status: Home / Self Care | Payer: Self-pay | Source: Ambulatory Visit | Attending: Cardiology | Admitting: Cardiology

## 2018-10-25 ENCOUNTER — Other Ambulatory Visit: Payer: Self-pay

## 2018-10-25 DIAGNOSIS — G4730 Sleep apnea, unspecified: Secondary | ICD-10-CM | POA: Insufficient documentation

## 2018-10-25 DIAGNOSIS — E088 Diabetes mellitus due to underlying condition with unspecified complications: Secondary | ICD-10-CM | POA: Diagnosis present

## 2018-10-25 DIAGNOSIS — Z96642 Presence of left artificial hip joint: Secondary | ICD-10-CM | POA: Insufficient documentation

## 2018-10-25 DIAGNOSIS — Z833 Family history of diabetes mellitus: Secondary | ICD-10-CM | POA: Insufficient documentation

## 2018-10-25 DIAGNOSIS — E782 Mixed hyperlipidemia: Secondary | ICD-10-CM | POA: Insufficient documentation

## 2018-10-25 DIAGNOSIS — I10 Essential (primary) hypertension: Secondary | ICD-10-CM | POA: Diagnosis present

## 2018-10-25 DIAGNOSIS — Z720 Tobacco use: Secondary | ICD-10-CM | POA: Diagnosis present

## 2018-10-25 DIAGNOSIS — F329 Major depressive disorder, single episode, unspecified: Secondary | ICD-10-CM | POA: Insufficient documentation

## 2018-10-25 DIAGNOSIS — R0789 Other chest pain: Secondary | ICD-10-CM | POA: Insufficient documentation

## 2018-10-25 DIAGNOSIS — Z79899 Other long term (current) drug therapy: Secondary | ICD-10-CM | POA: Insufficient documentation

## 2018-10-25 DIAGNOSIS — E785 Hyperlipidemia, unspecified: Secondary | ICD-10-CM | POA: Diagnosis present

## 2018-10-25 DIAGNOSIS — Z6841 Body Mass Index (BMI) 40.0 and over, adult: Secondary | ICD-10-CM | POA: Insufficient documentation

## 2018-10-25 DIAGNOSIS — R079 Chest pain, unspecified: Secondary | ICD-10-CM

## 2018-10-25 DIAGNOSIS — Z96651 Presence of right artificial knee joint: Secondary | ICD-10-CM | POA: Insufficient documentation

## 2018-10-25 DIAGNOSIS — I209 Angina pectoris, unspecified: Secondary | ICD-10-CM | POA: Diagnosis present

## 2018-10-25 DIAGNOSIS — Z8249 Family history of ischemic heart disease and other diseases of the circulatory system: Secondary | ICD-10-CM | POA: Insufficient documentation

## 2018-10-25 DIAGNOSIS — M1990 Unspecified osteoarthritis, unspecified site: Secondary | ICD-10-CM | POA: Insufficient documentation

## 2018-10-25 DIAGNOSIS — F17210 Nicotine dependence, cigarettes, uncomplicated: Secondary | ICD-10-CM | POA: Insufficient documentation

## 2018-10-25 DIAGNOSIS — E119 Type 2 diabetes mellitus without complications: Secondary | ICD-10-CM | POA: Insufficient documentation

## 2018-10-25 DIAGNOSIS — K219 Gastro-esophageal reflux disease without esophagitis: Secondary | ICD-10-CM | POA: Insufficient documentation

## 2018-10-25 DIAGNOSIS — Z9851 Tubal ligation status: Secondary | ICD-10-CM | POA: Insufficient documentation

## 2018-10-25 HISTORY — PX: LEFT HEART CATH AND CORONARY ANGIOGRAPHY: CATH118249

## 2018-10-25 SURGERY — LEFT HEART CATH AND CORONARY ANGIOGRAPHY
Anesthesia: LOCAL

## 2018-10-25 MED ORDER — ONDANSETRON HCL 4 MG/2ML IJ SOLN: 4.0000 mg | Freq: Four times a day (QID) | INTRAMUSCULAR | Status: DC | PRN

## 2018-10-25 MED ORDER — ASPIRIN 81 MG PO CHEW
CHEWABLE_TABLET | ORAL | Status: AC
  Administered 2018-10-25: 81 mg
  Filled 2018-10-25: qty 1

## 2018-10-25 MED ORDER — LIDOCAINE HCL (PF) 1 % IJ SOLN
INTRAMUSCULAR | Status: DC | PRN
  Administered 2018-10-25: 20 mL

## 2018-10-25 MED ORDER — SODIUM CHLORIDE 0.9 % WEIGHT BASED INFUSION: 1.0000 mL/kg/h | INTRAVENOUS | Status: DC

## 2018-10-25 MED ORDER — SODIUM CHLORIDE 0.9% FLUSH: 3.0000 mL | INTRAVENOUS | Status: DC | PRN

## 2018-10-25 MED ORDER — SODIUM CHLORIDE 0.9% FLUSH: 3.0000 mL | Freq: Two times a day (BID) | INTRAVENOUS | Status: DC

## 2018-10-25 MED ORDER — FENTANYL CITRATE (PF) 100 MCG/2ML IJ SOLN
INTRAMUSCULAR | Status: AC
  Filled 2018-10-25: qty 2

## 2018-10-25 MED ORDER — SODIUM CHLORIDE 0.9 % IV SOLN: 250.0000 mL | INTRAVENOUS | Status: DC | PRN

## 2018-10-25 MED ORDER — VERAPAMIL HCL 2.5 MG/ML IV SOLN
INTRAVENOUS | Status: DC | PRN
  Administered 2018-10-25: 14:00:00 via INTRA_ARTERIAL

## 2018-10-25 MED ORDER — IOHEXOL 350 MG/ML SOLN
INTRAVENOUS | Status: DC | PRN
  Administered 2018-10-25: 60 mL via INTRAVENOUS

## 2018-10-25 MED ORDER — MIDAZOLAM HCL 2 MG/2ML IJ SOLN
INTRAMUSCULAR | Status: AC
  Filled 2018-10-25: qty 2

## 2018-10-25 MED ORDER — ASPIRIN 81 MG PO CHEW: 81.0000 mg | CHEWABLE_TABLET | ORAL | Status: DC

## 2018-10-25 MED ORDER — HEPARIN (PORCINE) IN NACL 1000-0.9 UT/500ML-% IV SOLN
INTRAVENOUS | Status: AC
  Filled 2018-10-25: qty 1000

## 2018-10-25 MED ORDER — SODIUM CHLORIDE 0.9 % WEIGHT BASED INFUSION
3.0000 mL/kg/h | INTRAVENOUS | Status: AC
  Administered 2018-10-25: 3 mL/kg/h via INTRAVENOUS

## 2018-10-25 MED ORDER — HEPARIN (PORCINE) IN NACL 1000-0.9 UT/500ML-% IV SOLN
INTRAVENOUS | Status: DC | PRN
  Administered 2018-10-25 (×2): 500 mL

## 2018-10-25 MED ORDER — LIDOCAINE HCL (PF) 1 % IJ SOLN
INTRAMUSCULAR | Status: AC
  Filled 2018-10-25: qty 30

## 2018-10-25 MED ORDER — MIDAZOLAM HCL 2 MG/2ML IJ SOLN
INTRAMUSCULAR | Status: DC | PRN
  Administered 2018-10-25: 1 mg via INTRAVENOUS

## 2018-10-25 MED ORDER — FENTANYL CITRATE (PF) 100 MCG/2ML IJ SOLN
INTRAMUSCULAR | Status: DC | PRN
  Administered 2018-10-25: 25 ug via INTRAVENOUS

## 2018-10-25 MED ORDER — VERAPAMIL HCL 2.5 MG/ML IV SOLN
INTRAVENOUS | Status: AC
  Filled 2018-10-25: qty 2

## 2018-10-25 MED ORDER — HEPARIN SODIUM (PORCINE) 1000 UNIT/ML IJ SOLN
INTRAMUSCULAR | Status: DC | PRN
  Administered 2018-10-25: 6000 [IU] via INTRAVENOUS

## 2018-10-25 MED ORDER — ACETAMINOPHEN 325 MG PO TABS: 650.0000 mg | ORAL_TABLET | ORAL | Status: DC | PRN

## 2018-10-25 SURGICAL SUPPLY — 11 items
CATH 5FR JL3.5 JR4 ANG PIG MP (CATHETERS) ×2 IMPLANT
DEVICE RAD COMP TR BAND LRG (VASCULAR PRODUCTS) ×2 IMPLANT
GLIDESHEATH SLEND SS 6F .021 (SHEATH) ×2 IMPLANT
GUIDEWIRE INQWIRE 1.5J.035X260 (WIRE) ×1 IMPLANT
INQWIRE 1.5J .035X260CM (WIRE) ×2
KIT HEART LEFT (KITS) ×2 IMPLANT
PACK CARDIAC CATHETERIZATION (CUSTOM PROCEDURE TRAY) ×2 IMPLANT
SHEATH PROBE COVER 6X72 (BAG) ×2 IMPLANT
SYR MEDRAD MARK V 150ML (SYRINGE) ×2 IMPLANT
TRANSDUCER W/STOPCOCK (MISCELLANEOUS) ×2 IMPLANT
TUBING CIL FLEX 10 FLL-RA (TUBING) ×2 IMPLANT

## 2018-10-25 NOTE — Discharge Instructions (Signed)

## 2018-10-25 NOTE — Interval H&P Note (Signed)
History and Physical Interval Note:  10/25/2018 1:13 PM  Sarah Randall  has presented today for surgery, with the diagnosis of cp  The various methods of treatment have been discussed with the patient and family. After consideration of risks, benefits and other options for treatment, the patient has consented to  Procedure(s): LEFT HEART CATH AND CORONARY ANGIOGRAPHY (N/A) as a surgical intervention .  The patient's history has been reviewed, patient examined, no change in status, stable for surgery.  I have reviewed the patient's chart and labs.  Questions were answered to the patient's satisfaction.   Cath Lab Visit (complete for each Cath Lab visit)  Clinical Evaluation Leading to the Procedure:   ACS: No.  Non-ACS:    Anginal Classification: CCS III  Anti-ischemic medical therapy: Minimal Therapy (1 class of medications)  Non-Invasive Test Results: No non-invasive testing performed  Prior CABG: No previous CABG        Collier Salina [facility] 10/25/2018 1:14 PM

## 2018-10-26 ENCOUNTER — Encounter (HOSPITAL_COMMUNITY): Payer: Self-pay | Admitting: Cardiology

## 2018-11-01 MED FILL — ESOMEPRAZOLE MAG DR 40 MG C: 40 | 30 days supply | Qty: 30 | Fill #1

## 2018-11-01 MED FILL — LOSARTAN POTASSIUM 50 MG TA: 50 | 30 days supply | Qty: 30 | Fill #1

## 2018-11-08 MED FILL — PRAVASTATIN SODIUM 20 MG TA: 20 | 30 days supply | Qty: 12 | Fill #1

## 2018-11-08 MED FILL — HYDROCHLOROTHIAZIDE 25 MG T: 25 | 30 days supply | Qty: 30 | Fill #1

## 2018-11-10 ENCOUNTER — Encounter: Payer: Self-pay | Admitting: Internal Medicine

## 2018-11-10 ENCOUNTER — Ambulatory Visit: Payer: Self-pay | Attending: Internal Medicine | Admitting: Internal Medicine

## 2018-11-10 VITALS — BP 104/79 | HR 83 | Temp 98.2°F | Resp 16 | Wt 266.4 lb

## 2018-11-10 DIAGNOSIS — Z1239 Encounter for other screening for malignant neoplasm of breast: Secondary | ICD-10-CM | POA: Insufficient documentation

## 2018-11-10 DIAGNOSIS — Z8249 Family history of ischemic heart disease and other diseases of the circulatory system: Secondary | ICD-10-CM | POA: Insufficient documentation

## 2018-11-10 DIAGNOSIS — N920 Excessive and frequent menstruation with regular cycle: Secondary | ICD-10-CM | POA: Insufficient documentation

## 2018-11-10 DIAGNOSIS — F329 Major depressive disorder, single episode, unspecified: Secondary | ICD-10-CM | POA: Insufficient documentation

## 2018-11-10 DIAGNOSIS — Z803 Family history of malignant neoplasm of breast: Secondary | ICD-10-CM | POA: Insufficient documentation

## 2018-11-10 DIAGNOSIS — M1711 Unilateral primary osteoarthritis, right knee: Secondary | ICD-10-CM | POA: Insufficient documentation

## 2018-11-10 DIAGNOSIS — Z801 Family history of malignant neoplasm of trachea, bronchus and lung: Secondary | ICD-10-CM | POA: Insufficient documentation

## 2018-11-10 DIAGNOSIS — E119 Type 2 diabetes mellitus without complications: Secondary | ICD-10-CM | POA: Insufficient documentation

## 2018-11-10 DIAGNOSIS — Z6841 Body Mass Index (BMI) 40.0 and over, adult: Secondary | ICD-10-CM | POA: Insufficient documentation

## 2018-11-10 DIAGNOSIS — E785 Hyperlipidemia, unspecified: Secondary | ICD-10-CM | POA: Insufficient documentation

## 2018-11-10 DIAGNOSIS — M1612 Unilateral primary osteoarthritis, left hip: Secondary | ICD-10-CM | POA: Insufficient documentation

## 2018-11-10 DIAGNOSIS — F17210 Nicotine dependence, cigarettes, uncomplicated: Secondary | ICD-10-CM | POA: Insufficient documentation

## 2018-11-10 DIAGNOSIS — Z833 Family history of diabetes mellitus: Secondary | ICD-10-CM | POA: Insufficient documentation

## 2018-11-10 DIAGNOSIS — F32A Depression, unspecified: Secondary | ICD-10-CM

## 2018-11-10 DIAGNOSIS — G4733 Obstructive sleep apnea (adult) (pediatric): Secondary | ICD-10-CM | POA: Insufficient documentation

## 2018-11-10 DIAGNOSIS — F320 Major depressive disorder, single episode, mild: Secondary | ICD-10-CM

## 2018-11-10 DIAGNOSIS — D72829 Elevated white blood cell count, unspecified: Secondary | ICD-10-CM | POA: Insufficient documentation

## 2018-11-10 DIAGNOSIS — Z124 Encounter for screening for malignant neoplasm of cervix: Secondary | ICD-10-CM | POA: Insufficient documentation

## 2018-11-10 DIAGNOSIS — Z79899 Other long term (current) drug therapy: Secondary | ICD-10-CM | POA: Insufficient documentation

## 2018-11-10 DIAGNOSIS — I10 Essential (primary) hypertension: Secondary | ICD-10-CM | POA: Insufficient documentation

## 2018-11-10 NOTE — Progress Notes (Signed)
Patient ID: Sarah Randall, female    DOB: [birth year]  MRN: [id number]  CC: Gynecologic Exam   Subjective: Sarah Randall is a 52 y.o. female who presents for PAP Her concerns today include:   Reports having one abnormal Pap many years ago.  She did not need to have any procedure done for it.  Reports no abnormal Pap smears since then.   Planes of yellowish dischg since last week. no dysuria.  Noted an occasional sharp pain LT suprapubic area x2 days.   Sexually active occasionally with 1 partner Still gets periods -last 7 days.  Heavy 1st 3 days with clots and cramps in the lower back. LNMP 10/21/2018 No fhx of uterine/ovarian Breast cancer in mom dx at age 13  Depression: Started on Lexapro about a month ago.  She reports that she is doing well on this medication. Patient Active Problem List   Diagnosis Date Noted  . Diabetes mellitus due to underlying condition with unspecified complications ([place]) 02/40/9735  . Angina pectoris ([place]) 10/21/2018  . Chronic right shoulder pain 07/05/2018  . Trochanteric bursitis of left hip 07/05/2018  . Morbid obesity ([place]) 07/05/2018  . Body mass index 45.0-49.9, adult ([place]) 07/05/2018  . Marijuana user 05/26/2018  . S/P total knee arthroplasty, right 01/12/2018  . History of hip replacement 09/22/2017  . Hyperlipidemia 08/10/2017  . Primary osteoarthritis of left hip 06/25/2017  . Primary osteoarthritis of right knee 06/25/2017  . Depression 06/25/2017  . Essential hypertension 06/25/2017  . Tobacco abuse 06/25/2017  . Prediabetes 06/25/2017  . OSA on CPAP 06/25/2017  . Bronchitis 09/24/2016  . Left-sided chest pain 09/24/2016  . Leukocytosis 09/24/2016  . Lumbar radicular pain 07/16/2016  . Degenerative lumbar disc 07/03/2016  . Synovitis of hip 04/20/2016  . Screening for breast cancer 12/28/2014  . Type II diabetes mellitus ([place]) 04/06/2014  . Pain in joint involving lower leg 05/13/2013     Current Outpatient Medications on  File Prior to Visit  Medication Sig Dispense Refill  . escitalopram (LEXAPRO) 10 MG tablet 1/2 tab PO daily x 1 wk then 1 tab PO ddaily (Patient taking differently: Take 10 mg by mouth daily. ) 30 tablet 1  . esomeprazole (NEXIUM) 40 MG capsule Take 1 capsule (40 mg total) by mouth daily. 30 capsule 3  . hydrochlorothiazide (HYDRODIURIL) 25 MG tablet Take 1 tablet (25 mg total) by mouth daily. 30 tablet 2  . losartan (COZAAR) 50 MG tablet Take 1 tablet (50 mg total) by mouth daily. 30 tablet 6  . meclizine (ANTIVERT) 25 MG tablet 1/2-1 tid prn dizziness (Patient taking differently: Take 25 mg by mouth 3 (three) times daily as needed for dizziness. 1 tid prn dizziness) 30 tablet 0  . nicotine (NICODERM CQ - DOSED IN MG/24 HOURS) 21 mg/24hr patch Place 1 patch (21 mg total) onto the skin daily. (Patient not taking: Reported on 10/21/2018) 28 patch 0  . nitroGLYCERIN (NITROSTAT) 0.4 MG SL tablet Place 1 tablet (0.4 mg total) under the tongue every 5 (five) minutes as needed. 25 tablet 11  . pravastatin (PRAVACHOL) 20 MG tablet 1 tab Q Mon/Wed and Fri (Patient taking differently: Take 20 mg by mouth every Monday, Wednesday, and Friday. 1 tab Q Mon/Wed and Fri) 30 tablet 3  . propranolol (INDERAL) 10 MG tablet Take 1 tablet (10 mg total) by mouth 2 (two) times daily. 60 tablet 2   No current facility-administered medications on file prior to visit.  Allergies  Allergen Reactions  . Lipitor [Atorvastatin] Other (See Comments)    Stabbing pains in legs  . Zoloft [Sertraline Hcl] Other (See Comments)    tremors    Social History   Socioeconomic History  . Marital status: Single    Spouse name: Not on file  . Number of children: 2  . Years of education: 9  . Highest education level: Not on file  Occupational History  . Occupation: unemployed  Social Needs  . Financial resource strain: Not on file  . Food insecurity:    Worry: Not on file    Inability: Not on file  . Transportation  needs:    Medical: Not on file    Non-medical: Not on file  Tobacco Use  . Smoking status: Current Some Day Smoker    Packs/day: 0.25    Years: 30.00    Pack years: 7.50    Types: Cigarettes  . Smokeless tobacco: Never Used  Substance and Sexual Activity  . Alcohol use: Yes    Comment: occasional  . Drug use: No    Comment: smoke 1 joint cannibis 1 week ago.  Marland Kitchen Sexual activity: Yes    Birth control/protection: None  Lifestyle  . Physical activity:    Days per week: Not on file    Minutes per session: Not on file  . Stress: Not on file  Relationships  . Social connections:    Talks on phone: Not on file    Gets together: Not on file    Attends religious service: Not on file    Active member of club or organization: Not on file    Attends meetings of clubs or organizations: Not on file    Relationship status: Not on file  . Intimate partner violence:    Fear of current or ex partner: Not on file    Emotionally abused: Not on file    Physically abused: Not on file    Forced sexual activity: Not on file  Other Topics Concern  . Not on file  Social History Narrative  . Not on file    Family History  Problem Relation Age of Onset  . Hypertension Mother   . Diabetes Mother   . Hypertension Father   . Breast cancer Maternal Aunt   . Lung cancer Maternal Aunt     Past Surgical History:  Procedure Laterality Date  . LEFT HEART CATH AND CORONARY ANGIOGRAPHY N/A 10/25/2018   Procedure: LEFT HEART CATH AND CORONARY ANGIOGRAPHY;  Surgeon: Martinique, Peter M, MD;  Location: [facility];  Service: Cardiovascular;  Laterality: N/A;  . TOTAL HIP ARTHROPLASTY Left 09/22/2017   Procedure: LEFT TOTAL HIP ARTHROPLASTY ANTERIOR APPROACH;  Surgeon: Leandrew Koyanagi, MD;  Location: [facility];  Service: Orthopedics;  Laterality: Left;  . TOTAL KNEE ARTHROPLASTY Right 01/12/2018   Procedure: RIGHT TOTAL KNEE ARTHROPLASTY;  Surgeon: Leandrew Koyanagi, MD;  Location: [facility];  Service: Orthopedics;   Laterality: Right;  . TUBAL LIGATION      ROS: Review of Systems Negative except as above. PHYSICAL EXAM: BP 104/79   Pulse 83   Temp 98.2 F (36.8 C) (Oral)   Resp 16   Wt 266 lb 6.4 oz (120.8 kg)   SpO2 99%   BMI 45.73 kg/m   Physical Exam  General appearance - alert, well appearing, and in no distress Mental status - normal mood, behavior, speech, dress, motor activity, and thought processes Breasts - breasts appear normal, no  suspicious masses, no skin or nipple changes or axillary nodes Pelvic - normal external genitalia.  Small amount of thin white discharge in the vaginal vault. cervix, uterus and adnexa are normal. Abdomen: Nondistended, soft and nontender.  Lab Results  Component Value Date   WBC 15.7 (H) 10/24/2018   HGB 16.2 (H) 10/24/2018   HCT 47.0 (H) 10/24/2018   MCV 91 10/24/2018   PLT 410 10/24/2018    ASSESSMENT AND PLAN:  1. Pap smear for cervical cancer screening - Cytology - PAP  2. Breast cancer screening - MM Digital Screening; Future  3. Leukocytosis, unspecified type Plan was for repeat CBC with differential today. - CBC with Differential/Platelet  4. Mild depression ([place]) Doing well on Lexapro.  5.  Menorrhagia  Normal H&H on recent CBC.  Uterus does not feel enlarged on today's exam.  Given her age hopefully she should start going through menopause fairly soon.  patient was given the opportunity to ask questions.  Patient verbalized understanding of the plan and was able to repeat key elements of the plan.   Orders Placed This Encounter  Procedures  . MM Digital Screening  . CBC with Differential/Platelet     Requested Prescriptions    No prescriptions requested or ordered in this encounter    Return in about 3 months (around 02/09/2019).  Karle Plumber, MD, FACP

## 2018-11-14 ENCOUNTER — Other Ambulatory Visit: Payer: Self-pay | Admitting: Internal Medicine

## 2018-11-14 LAB — CYTOLOGY - PAP
Bacterial vaginitis: POSITIVE — AB
Candida vaginitis: NEGATIVE
Chlamydia: NEGATIVE
Diagnosis: NEGATIVE
HPV: NOT DETECTED
Neisseria Gonorrhea: NEGATIVE
Trichomonas: POSITIVE — AB

## 2018-11-14 MED ORDER — METRONIDAZOLE 500 MG PO TABS: 500.0000 mg | ORAL_TABLET | Freq: Two times a day (BID) | ORAL | 0 refills | Status: DC

## 2018-11-15 LAB — CBC WITH DIFFERENTIAL/PLATELET
Basophils Absolute: 0.1 10*3/uL (ref 0.0–0.2)
Basos: 1 %
EOS (ABSOLUTE): 0.3 10*3/uL (ref 0.0–0.4)
Eos: 2 %
Hematocrit: 42.7 % (ref 34.0–46.6)
Hemoglobin: 14.8 g/dL (ref 11.1–15.9)
Immature Grans (Abs): 0 10*3/uL (ref 0.0–0.1)
Immature Granulocytes: 0 %
Lymphocytes Absolute: 5.5 10*3/uL — ABNORMAL HIGH (ref 0.7–3.1)
Lymphs: 38 %
MCH: 31.4 pg (ref 26.6–33.0)
MCHC: 34.7 g/dL (ref 31.5–35.7)
MCV: 91 fL (ref 79–97)
Monocytes Absolute: 0.8 10*3/uL (ref 0.1–0.9)
Monocytes: 5 %
Neutrophils Absolute: 7.8 10*3/uL — ABNORMAL HIGH (ref 1.4–7.0)
Neutrophils: 54 %
Platelets: 420 10*3/uL (ref 150–450)
RBC: 4.71 x10E6/uL (ref 3.77–5.28)
RDW: 12.7 % (ref 12.3–15.4)
WBC: 14.5 10*3/uL — ABNORMAL HIGH (ref 3.4–10.8)

## 2018-11-15 MED FILL — metroNIDAZOLE 500 MG TABS: 500 | 7 days supply | Qty: 14 | Fill #0

## 2018-11-18 ENCOUNTER — Telehealth: Payer: Self-pay | Admitting: Internal Medicine

## 2018-11-18 NOTE — Telephone Encounter (Signed)
Patient called for results and I informed her of the pap results pt doesn't have any questions or concerns.

## 2018-11-28 ENCOUNTER — Ambulatory Visit: Payer: Self-pay | Admitting: Cardiology

## 2018-12-08 MED FILL — HYDROCHLOROTHIAZIDE 25 MG T: 25 | 30 days supply | Qty: 30 | Fill #2

## 2018-12-08 MED FILL — ESOMEPRAZOLE MAG DR 40 MG C: 40 | 30 days supply | Qty: 30 | Fill #2

## 2018-12-08 MED FILL — LOSARTAN POTASSIUM 50 MG TA: 50 | 30 days supply | Qty: 30 | Fill #2

## 2019-01-11 MED FILL — PROPRANOLOL 10 MG TABLET: 10 | 30 days supply | Qty: 60 | Fill #1

## 2019-01-11 MED FILL — PRAVASTATIN SODIUM 20 MG TA: 20 | 30 days supply | Qty: 12 | Fill #2

## 2019-01-20 MED FILL — ESOMEPRAZOLE MAG DR 40 MG C: 40 | 30 days supply | Qty: 30 | Fill #3

## 2019-01-20 MED FILL — LOSARTAN POTASSIUM 50 MG TA: 50 | 30 days supply | Qty: 30 | Fill #3

## 2019-01-20 MED FILL — HYDROCHLOROTHIAZIDE 25 MG T: 25 | 30 days supply | Qty: 30 | Fill #1

## 2019-02-14 ENCOUNTER — Ambulatory Visit: Payer: Self-pay | Admitting: Internal Medicine

## 2019-02-24 ENCOUNTER — Other Ambulatory Visit: Payer: Self-pay

## 2019-02-24 ENCOUNTER — Ambulatory Visit: Payer: Self-pay | Attending: Family Medicine | Admitting: Family Medicine

## 2019-02-24 VITALS — BP 122/88 | HR 91 | Temp 98.2°F | Resp 18 | Ht 63.0 in | Wt 273.0 lb

## 2019-02-24 DIAGNOSIS — H6692 Otitis media, unspecified, left ear: Secondary | ICD-10-CM

## 2019-02-24 DIAGNOSIS — J069 Acute upper respiratory infection, unspecified: Secondary | ICD-10-CM

## 2019-02-24 MED ORDER — AMOXICILLIN-POT CLAVULANATE 500-125 MG PO TABS: 1.0000 | ORAL_TABLET | Freq: Two times a day (BID) | ORAL | 0 refills | Status: AC

## 2019-02-24 MED ORDER — CETIRIZINE HCL 5 MG PO TABS: 5.0000 mg | ORAL_TABLET | Freq: Every day | ORAL | 3 refills | Status: DC

## 2019-02-24 MED FILL — AMOX-CLAV 500-125 MG TABLET: 500-125 | 7 days supply | Qty: 14 | Fill #0

## 2019-02-24 NOTE — Progress Notes (Signed)
Established Patient Office Visit  Subjective:  Patient ID: Sarah Randall, female    DOB: 1966-02-16  Age: 53 y.o. MRN: [id number]  CC: No chief complaint on file.   HPI Sarah Randall presents for sinus congestion and cough.  Patient reports increased nasal congestion with sensation of postnasal drainage as well as clear nasal discharge and cough that is worse at night which has been occurring for the past week.  Patient also with complaint of discomfort in her left ear which started within the past 2 days and was worse last night.  Patient denies any fever or chills.  Cough is been nonproductive.  Patient has had no recent sick contacts.  Patient has had no airplane or other travel within the past month.  Patient denies any chest pain.  Patient has had no shortness of breath or wheezing.  Past Medical History:  Diagnosis Date  . Arthritis   . Depression   . GERD (gastroesophageal reflux disease)    occasionally takes OTC  . Hypertension   . Pre-diabetes   . Sleep apnea    tested 2014 - unable to tolerate machine    Past Surgical History:  Procedure Laterality Date  . LEFT HEART CATH AND CORONARY ANGIOGRAPHY N/A 10/25/2018   Procedure: LEFT HEART CATH AND CORONARY ANGIOGRAPHY;  Surgeon: Martinique, Peter M, MD;  Location: [facility];  Service: Cardiovascular;  Laterality: N/A;  . TOTAL HIP ARTHROPLASTY Left 09/22/2017   Procedure: LEFT TOTAL HIP ARTHROPLASTY ANTERIOR APPROACH;  Surgeon: Leandrew Koyanagi, MD;  Location: [facility];  Service: Orthopedics;  Laterality: Left;  . TOTAL KNEE ARTHROPLASTY Right 01/12/2018   Procedure: RIGHT TOTAL KNEE ARTHROPLASTY;  Surgeon: Leandrew Koyanagi, MD;  Location: [facility];  Service: Orthopedics;  Laterality: Right;  . TUBAL LIGATION      Family History  Problem Relation Age of Onset  . Hypertension Mother   . Diabetes Mother   . Hypertension Father   . Breast cancer Maternal Aunt   . Lung cancer Maternal Aunt    Social History   Tobacco Use   . Smoking status: Current Some Day Smoker    Packs/day: 0.25    Years: 30.00    Pack years: 7.50    Types: Cigarettes  . Smokeless tobacco: Never Used  Substance Use Topics  . Alcohol use: Yes    Comment: occasional  . Drug use: No    Comment: smoke 1 joint cannibis 1 week ago.     Outpatient Medications Prior to Visit  Medication Sig Dispense Refill  . escitalopram (LEXAPRO) 10 MG tablet 1/2 tab PO daily x 1 wk then 1 tab PO ddaily (Patient taking differently: Take 10 mg by mouth daily. ) 30 tablet 1  . esomeprazole (NEXIUM) 40 MG capsule Take 1 capsule (40 mg total) by mouth daily. 30 capsule 3  . hydrochlorothiazide (HYDRODIURIL) 25 MG tablet Take 1 tablet (25 mg total) by mouth daily. 30 tablet 2  . losartan (COZAAR) 50 MG tablet Take 1 tablet (50 mg total) by mouth daily. 30 tablet 6  . meclizine (ANTIVERT) 25 MG tablet 1/2-1 tid prn dizziness (Patient taking differently: Take 25 mg by mouth 3 (three) times daily as needed for dizziness. 1 tid prn dizziness) 30 tablet 0  . metroNIDAZOLE (FLAGYL) 500 MG tablet Take 1 tablet (500 mg total) by mouth 2 (two) times daily. 14 tablet 0  . nicotine (NICODERM CQ - DOSED IN MG/24 HOURS) 21 mg/24hr patch Place 1  patch (21 mg total) onto the skin daily. (Patient not taking: Reported on 10/21/2018) 28 patch 0  . nitroGLYCERIN (NITROSTAT) 0.4 MG SL tablet Place 1 tablet (0.4 mg total) under the tongue every 5 (five) minutes as needed. 25 tablet 11  . pravastatin (PRAVACHOL) 20 MG tablet 1 tab Q Mon/Wed and Fri (Patient taking differently: Take 20 mg by mouth every Monday, Wednesday, and Friday. 1 tab Q Mon/Wed and Fri) 30 tablet 3  . propranolol (INDERAL) 10 MG tablet Take 1 tablet (10 mg total) by mouth 2 (two) times daily. 60 tablet 2   No facility-administered medications prior to visit.     Allergies  Allergen Reactions  . Lipitor [Atorvastatin] Other (See Comments)    Stabbing pains in legs  . Zoloft [Sertraline Hcl] Other (See  Comments)    tremors    ROS Review of Systems  Constitutional: Positive for fatigue. Negative for chills and fever.  HENT: Positive for congestion, ear pain, postnasal drip, rhinorrhea and sinus pressure. Negative for hearing loss, sinus pain, sneezing, sore throat and trouble swallowing.   Respiratory: Positive for cough. Negative for shortness of breath.   Cardiovascular: Negative for chest pain and leg swelling.  Gastrointestinal: Negative for abdominal pain, constipation, diarrhea and nausea.  Genitourinary: Negative for dysuria and frequency.  Neurological: Negative for dizziness and headaches.  Hematological: Negative for adenopathy. Does not bruise/bleed easily.      Objective:    Physical Exam  Constitutional: She is oriented to person, place, and time. She appears well-developed and well-nourished.  Obese older female in NAD  HENT:  Right Ear: Hearing, external ear and ear canal normal. Tympanic membrane is retracted (TM is dull and slightly retracted).  Left Ear: Hearing, external ear and ear canal normal. Tympanic membrane is erythematous (mild erythema and thickening of TM and no visible landmarks).  Nose: Mucosal edema and rhinorrhea (clear) present. Right sinus exhibits no maxillary sinus tenderness and no frontal sinus tenderness. Left sinus exhibits no maxillary sinus tenderness and no frontal sinus tenderness.  Mouth/Throat: Mucous membranes are normal. Posterior oropharyngeal edema (mild) and posterior oropharyngeal erythema (mild) present. No oropharyngeal exudate.  Eyes: Conjunctivae and EOM are normal.  Neck: Normal range of motion. Neck supple.  Cardiovascular: Normal rate and regular rhythm.  Pulmonary/Chest: Effort normal and breath sounds normal.  Abdominal: Soft. There is no abdominal tenderness. There is no rebound.  Lymphadenopathy:    She has no cervical adenopathy.  Neurological: She is alert and oriented to person, place, and time.  Skin: Skin is warm  and dry.  Psychiatric: She has a normal mood and affect. Her behavior is normal.  Nursing note and vitals reviewed.   There were no vitals taken for this visit. Wt Readings from Last 3 Encounters:  11/10/18 266 lb 6.4 oz (120.8 kg)  10/25/18 264 lb (119.7 kg)  10/24/18 264 lb 12.8 oz (120.1 kg)     Health Maintenance Due  Topic Date Due  . FOOT EXAM  09/19/1976  . OPHTHALMOLOGY EXAM  09/19/1976  . HIV Screening  09/19/1981  . TETANUS/TDAP  09/19/1985  . COLONOSCOPY  09/19/2016  . INFLUENZA VACCINE  07/07/2018  . MAMMOGRAM  09/09/2018    There are no preventive care reminders to display for this patient.  Lab Results  Component Value Date   TSH 0.751 09/08/2018   Lab Results  Component Value Date   WBC 14.5 (H) 11/14/2018   HGB 14.8 11/14/2018   HCT 42.7 11/14/2018   MCV  91 11/14/2018   PLT 420 11/14/2018   Lab Results  Component Value Date   NA 135 10/21/2018   K 3.6 10/21/2018   CO2 21 10/21/2018   GLUCOSE 106 (H) 10/21/2018   BUN 11 10/21/2018   CREATININE 0.90 10/21/2018   BILITOT 0.3 09/08/2018   ALKPHOS 87 09/08/2018   AST 14 09/08/2018   ALT 14 09/08/2018   PROT 7.2 09/08/2018   ALBUMIN 4.2 09/08/2018   CALCIUM 9.9 10/21/2018   ANIONGAP 14 01/13/2018   Lab Results  Component Value Date   CHOL 250 (H) 06/25/2017   Lab Results  Component Value Date   HDL 40 06/25/2017   Lab Results  Component Value Date   LDLCALC 158 (H) 06/25/2017   Lab Results  Component Value Date   TRIG 262 (H) 06/25/2017   Lab Results  Component Value Date   CHOLHDL 6.3 (H) 06/25/2017   Lab Results  Component Value Date   HGBA1C 6.0 (H) 09/08/2018      Assessment & Plan:  1. URI with cough and congestion Discussed with patient that her symptoms are consistent with URI, likely viral. Patient is given a RX for cetirizine 5 mg to take at bedtime to help with nasal congestion, postnasal drainage and this will likely help with cough.  - cetirizine (ZYRTEC) 5 MG  tablet; Take 1 tablet (5 mg total) by mouth daily. In the evening as needed for congestion  Dispense: 30 tablet; Refill: 3  2. Left otitis media, unspecified otitis media type RX for Augmentin 500 mg twice daily for 7 days after a meal for treatment of ear infection. PCT pain medications as needed for any ear pain - amoxicillin-clavulanate (AUGMENTIN) 500-125 MG tablet; Take 1 tablet (500 mg total) by mouth 2 (two) times daily for 7 days. Take after eating  Dispense: 14 tablet; Refill: 0  An After Visit Summary was printed and given to the patient.  Follow-up: Return in about 1 week (around 03/03/2019) for 1 week if not feeling better or feeling worse.   Antony Blackbird, MD

## 2019-03-04 ENCOUNTER — Encounter: Payer: Self-pay | Admitting: Family Medicine

## 2019-03-08 MED FILL — LOSARTAN POTASSIUM 50 MG TA: 50 | 30 days supply | Qty: 30 | Fill #4

## 2019-03-08 MED FILL — ESOMEPRAZOLE MAG DR 40 MG C: 40 | 30 days supply | Qty: 30 | Fill #3

## 2019-03-08 MED FILL — HYDROCHLOROTHIAZIDE 25 MG T: 25 | 30 days supply | Qty: 30 | Fill #2

## 2019-03-08 MED FILL — PRAVASTATIN SODIUM 20 MG TA: 20 | 30 days supply | Qty: 12 | Fill #3

## 2019-04-11 ENCOUNTER — Ambulatory Visit (INDEPENDENT_AMBULATORY_CARE_PROVIDER_SITE_OTHER): Payer: PPO | Admitting: Orthopaedic Surgery

## 2019-04-11 ENCOUNTER — Encounter: Payer: Self-pay | Admitting: Orthopaedic Surgery

## 2019-04-11 ENCOUNTER — Ambulatory Visit (INDEPENDENT_AMBULATORY_CARE_PROVIDER_SITE_OTHER): Payer: PPO

## 2019-04-11 ENCOUNTER — Other Ambulatory Visit: Payer: Self-pay

## 2019-04-11 VITALS — Ht 63.0 in | Wt 273.0 lb

## 2019-04-11 DIAGNOSIS — Z96651 Presence of right artificial knee joint: Secondary | ICD-10-CM

## 2019-04-11 DIAGNOSIS — Z96642 Presence of left artificial hip joint: Secondary | ICD-10-CM | POA: Diagnosis not present

## 2019-04-11 DIAGNOSIS — M5416 Radiculopathy, lumbar region: Secondary | ICD-10-CM | POA: Diagnosis not present

## 2019-04-11 MED ORDER — METHOCARBAMOL 500 MG PO TABS: 500.0000 mg | ORAL_TABLET | Freq: Every evening | ORAL | 0 refills | Status: DC | PRN

## 2019-04-11 MED ORDER — MELOXICAM 7.5 MG PO TABS: 7.5000 mg | ORAL_TABLET | Freq: Every day | ORAL | 2 refills | Status: DC

## 2019-04-11 NOTE — Progress Notes (Signed)
Office Visit Note   Patient: Sarah Randall           Date of Birth: 06-Nov-1966           MRN: [id number] Visit Date: 04/11/2019              Requested by: Ladell Pier, MD [address] [place], [place] [address] PCP: Ladell Pier, MD   Assessment & Plan: Visit Diagnoses:  1. Status post total right knee replacement   2. Status post total hip replacement, left   3. Radiculopathy, lumbar region     Plan: Impression is status post right total knee replacement, #2 status post left total hip replacement, #3 lumbar radiculopathy.  In regards to the right knee, I think she overdid things but she seems to be improving.  In regards to her hip and back, believe she needs physical therapy for core strengthening as well as overall deconditioning.  We will provide her with a PT prescription today.  I suggested a steroid taper, but she would like to avoid this for now.  We will start her on anti-inflammatories.  She will follow-up with [place] in October of this year for her 2-year follow-up of her left total hip replacement, she will see [place] sooner if needed for her back.  Call with concerns or questions in the meantime.  Follow-Up Instructions: Return in about 6 months (around 10/12/2019).   Orders:  Orders Placed This Encounter  Procedures  . XR KNEE 3 VIEW RIGHT   Meds ordered this encounter  Medications  . meloxicam (MOBIC) 7.5 MG tablet    Sig: Take 1 tablet (7.5 mg total) by mouth daily.    Dispense:  30 tablet    Refill:  2  . methocarbamol (ROBAXIN) 500 MG tablet    Sig: Take 1 tablet (500 mg total) by mouth at bedtime as needed for muscle spasms.    Dispense:  20 tablet    Refill:  0      Procedures: No procedures performed   Clinical Data: No additional findings.   Subjective: Chief Complaint  Patient presents with  . Right Knee - Pain, Edema    HPI patient is a pleasant 53 year old female who presents our clinic today with right knee pain, left hip and  back pain.  In regards to the right knee, she is status post total knee replacement, date of surgery 01/12/2018.  Doing well until this past Monday.  She had increased pain and swelling.  She denies any specific injury but does note that she may have been cooking a little more over the past few weeks.  Her pain has significantly improved over the past few days.  Other issue she brings up is her left hip.  She has some pain in the groin but mostly in the buttocks and lateral hip.  She is status post left total hip replacement 09/22/2017.  She was seen in our office several months ago where she was injected into the trochanteric bursa.  This minimally helped for about 2 weeks.  She has had continued pain to the buttocks going down the left leg.  Worse when she is bending over or getting in and out of the car.  She also has some pain going up and down the stairs as well as with trying to lift her legs.  She does note occasional weakness to both legs.  She has not been to formal physical therapy for her back.  She has  tried a steroid taper in the past which causes significant bloating.  She denies any numbness, tingling or burning to either lower extremity.  Review of Systems as detailed in HPI.  All others reviewed and are negative.   Objective: Vital Signs: Ht 5\' 3"  (1.6 m)   Wt 273 lb (123.8 kg)   BMI 48.36 kg/m   Physical Exam well-developed well-nourished female no acute distress.  Alert and oriented x3.  Ortho Exam examination of her right knee shows range of motion from 0 to 100 degrees.  She is stable valgus varus stress.  Calf is soft nontender.  Negative Homans.  She is neurovascular intact distally.  Examination of her left hip shows negative logroll.  Markedly positive straight leg raise.  Lumbar spine is negative for spinous or paraspinous tenderness.  No pain with lumbar flexion or extension.  She is neurovascularly intact distally.  Specialty Comments:  No specialty comments available.   Imaging: Xr Knee 3 View Right  Result Date: 04/11/2019 X-rays of the right knee show stable alignment of the prosthesis without complication.    PMFS History: Patient Active Problem List   Diagnosis Date Noted  . Diabetes mellitus due to underlying condition with unspecified complications ([place]) 10/93/2355  . Angina pectoris ([place]) 10/21/2018  . Chronic right shoulder pain 07/05/2018  . Trochanteric bursitis of left hip 07/05/2018  . Morbid obesity ([place]) 07/05/2018  . Body mass index 45.0-49.9, adult ([place]) 07/05/2018  . Marijuana user 05/26/2018  . Status post total right knee replacement 01/12/2018  . Status post total hip replacement, left 09/22/2017  . Hyperlipidemia 08/10/2017  . Primary osteoarthritis of left hip 06/25/2017  . Primary osteoarthritis of right knee 06/25/2017  . Depression 06/25/2017  . Essential hypertension 06/25/2017  . Tobacco abuse 06/25/2017  . Prediabetes 06/25/2017  . OSA on CPAP 06/25/2017  . Bronchitis 09/24/2016  . Left-sided chest pain 09/24/2016  . Leukocytosis 09/24/2016  . Radiculopathy, lumbar region 07/16/2016  . Degenerative lumbar disc 07/03/2016  . Synovitis of hip 04/20/2016  . Screening for breast cancer 12/28/2014  . Type II diabetes mellitus ([place]) 04/06/2014  . Pain in joint involving lower leg 05/13/2013   Past Medical History:  Diagnosis Date  . Arthritis   . Depression   . GERD (gastroesophageal reflux disease)    occasionally takes OTC  . Hypertension   . Pre-diabetes   . Sleep apnea    tested 2014 - unable to tolerate machine    Family History  Problem Relation Age of Onset  . Hypertension Mother   . Diabetes Mother   . Hypertension Father   . Breast cancer Maternal Aunt   . Lung cancer Maternal Aunt     Past Surgical History:  Procedure Laterality Date  . LEFT HEART CATH AND CORONARY ANGIOGRAPHY N/A 10/25/2018   Procedure: LEFT HEART CATH AND CORONARY ANGIOGRAPHY;  Surgeon: Martinique, Peter M, MD;  Location: [facility];  Service: Cardiovascular;  Laterality: N/A;  . TOTAL HIP ARTHROPLASTY Left 09/22/2017   Procedure: LEFT TOTAL HIP ARTHROPLASTY ANTERIOR APPROACH;  Surgeon: Leandrew Koyanagi, MD;  Location: [facility];  Service: Orthopedics;  Laterality: Left;  . TOTAL KNEE ARTHROPLASTY Right 01/12/2018   Procedure: RIGHT TOTAL KNEE ARTHROPLASTY;  Surgeon: Leandrew Koyanagi, MD;  Location: [facility];  Service: Orthopedics;  Laterality: Right;  . TUBAL LIGATION     Social History   Occupational History  . Occupation: unemployed  Tobacco Use  . Smoking status: Current Some Day Smoker  Packs/day: 0.25    Years: 30.00    Pack years: 7.50    Types: Cigarettes  . Smokeless tobacco: Never Used  Substance and Sexual Activity  . Alcohol use: Yes    Comment: occasional  . Drug use: No    Comment: smoke 1 joint cannibis 1 week ago.  Marland Kitchen Sexual activity: Yes    Birth control/protection: None

## 2019-04-12 ENCOUNTER — Other Ambulatory Visit: Payer: Self-pay | Admitting: Internal Medicine

## 2019-04-12 DIAGNOSIS — I10 Essential (primary) hypertension: Secondary | ICD-10-CM

## 2019-04-12 DIAGNOSIS — K219 Gastro-esophageal reflux disease without esophagitis: Secondary | ICD-10-CM

## 2019-04-12 MED FILL — PROPRANOLOL 10 MG TABLET: 10 | 30 days supply | Qty: 60 | Fill #2

## 2019-04-12 MED FILL — ESOMEPRAZOLE MAG DR 40 MG C: 40 | 30 days supply | Qty: 30 | Fill #0

## 2019-04-12 MED FILL — LOSARTAN POTASSIUM 50 MG TA: 50 | 30 days supply | Qty: 30 | Fill #5

## 2019-04-12 MED FILL — HYDROCHLOROTHIAZIDE 25 MG T: 25 | 30 days supply | Qty: 30 | Fill #0

## 2019-04-19 DIAGNOSIS — R262 Difficulty in walking, not elsewhere classified: Secondary | ICD-10-CM | POA: Diagnosis not present

## 2019-04-19 DIAGNOSIS — M5416 Radiculopathy, lumbar region: Secondary | ICD-10-CM | POA: Diagnosis not present

## 2019-04-19 DIAGNOSIS — M6281 Muscle weakness (generalized): Secondary | ICD-10-CM | POA: Diagnosis not present

## 2019-04-19 DIAGNOSIS — M545 Low back pain: Secondary | ICD-10-CM | POA: Diagnosis not present

## 2019-04-28 DIAGNOSIS — M5416 Radiculopathy, lumbar region: Secondary | ICD-10-CM | POA: Diagnosis not present

## 2019-04-28 DIAGNOSIS — R262 Difficulty in walking, not elsewhere classified: Secondary | ICD-10-CM | POA: Diagnosis not present

## 2019-04-28 DIAGNOSIS — M6281 Muscle weakness (generalized): Secondary | ICD-10-CM | POA: Diagnosis not present

## 2019-04-28 DIAGNOSIS — M545 Low back pain: Secondary | ICD-10-CM | POA: Diagnosis not present

## 2019-05-03 DIAGNOSIS — M5416 Radiculopathy, lumbar region: Secondary | ICD-10-CM | POA: Diagnosis not present

## 2019-05-03 DIAGNOSIS — M545 Low back pain: Secondary | ICD-10-CM | POA: Diagnosis not present

## 2019-05-03 DIAGNOSIS — M6281 Muscle weakness (generalized): Secondary | ICD-10-CM | POA: Diagnosis not present

## 2019-05-03 DIAGNOSIS — R262 Difficulty in walking, not elsewhere classified: Secondary | ICD-10-CM | POA: Diagnosis not present

## 2019-05-24 ENCOUNTER — Other Ambulatory Visit: Payer: Self-pay

## 2019-05-24 MED FILL — ESOMEPRAZOLE MAG DR 40 MG C: 40 | 30 days supply | Qty: 30 | Fill #1

## 2019-05-24 MED FILL — HYDROCHLOROTHIAZIDE 25 MG T: 25 | 30 days supply | Qty: 30 | Fill #1

## 2019-05-24 MED FILL — LOSARTAN POTASSIUM 50 MG TA: 50 | 30 days supply | Qty: 30 | Fill #6

## 2019-05-26 ENCOUNTER — Ambulatory Visit (INDEPENDENT_AMBULATORY_CARE_PROVIDER_SITE_OTHER): Payer: Medicare Other | Admitting: Obstetrics & Gynecology

## 2019-05-26 ENCOUNTER — Encounter: Payer: Self-pay | Admitting: Obstetrics & Gynecology

## 2019-05-26 ENCOUNTER — Ambulatory Visit: Payer: Self-pay | Admitting: Obstetrics & Gynecology

## 2019-05-26 ENCOUNTER — Other Ambulatory Visit: Payer: Self-pay

## 2019-05-26 VITALS — BP 124/78 | Ht 63.0 in | Wt 268.0 lb

## 2019-05-26 DIAGNOSIS — N945 Secondary dysmenorrhea: Secondary | ICD-10-CM

## 2019-05-26 DIAGNOSIS — F17210 Nicotine dependence, cigarettes, uncomplicated: Secondary | ICD-10-CM

## 2019-05-26 DIAGNOSIS — N920 Excessive and frequent menstruation with regular cycle: Secondary | ICD-10-CM

## 2019-05-26 DIAGNOSIS — Z6841 Body Mass Index (BMI) 40.0 and over, adult: Secondary | ICD-10-CM

## 2019-05-26 LAB — CBC
HCT: 47 % — ABNORMAL HIGH (ref 35.0–45.0)
Hemoglobin: 15.9 g/dL — ABNORMAL HIGH (ref 11.7–15.5)
MCH: 31.3 pg (ref 27.0–33.0)
MCHC: 33.8 g/dL (ref 32.0–36.0)
MCV: 92.5 fL (ref 80.0–100.0)
MPV: 10.1 fL (ref 7.5–12.5)
Platelets: 413 10*3/uL — ABNORMAL HIGH (ref 140–400)
RBC: 5.08 10*6/uL (ref 3.80–5.10)
RDW: 13.1 % (ref 11.0–15.0)
WBC: 13.8 10*3/uL — ABNORMAL HIGH (ref 3.8–10.8)

## 2019-05-26 LAB — TSH: TSH: 1.4 mIU/L

## 2019-05-26 NOTE — Patient Instructions (Signed)
1. Menorrhagia with regular cycle Normal gynecologic exam.  Will investigate menorrhagia with a CBC and a TSH today.  Will probably be anemic, recommend iron sulfate 325 mg 1 tablet twice a day.  Follow-up with a pelvic ultrasound to rule out endometrial polyp, submucosal fibroid, endometrial hyperplasia or endometrial cancer.  Will decide on management per results.  Estrogen containing hormone therapy contraindicated given that patient is a cigarette smoker.  Will discuss progestin forms of therapy or other treatment per findings.  Surgical approaches briefly discussed today. - CBC - TSH - US Transvaginal Non-OB; Future  2. Secondary dysmenorrhea Associated with menorrhagia.  3. Cigarette smoker Strongly recommend to quit smoking.  4. Class 3 severe obesity due to excess calories without serious comorbidity with body mass index (BMI) of 45.0 to 49.9 in adult [facility]) Recommend a lower calorie/carb diet such as [employer].  Aerobic physical activities 5 times a week and weightlifting every 2 days.  [place], it was a pleasure meeting you today!  I will inform you of your results as soon as they are available.

## 2019-05-26 NOTE — Progress Notes (Signed)
[place] 10/27/1966 [id number]   History:    53 y.o. G2P2L2 Married.  10 grand-children.  RP:  Heavy menses with dysmenorrhea worsening in the last few months  HPI: Menstrual periods every 3 to 4 weeks with heavy flow for 5 days out of 7 to 8 days.  Has overflow and blood clots.  Occasionally feeling dizzy and short of breath during the periods.  Worsening dysmenorrhea associated with the heavier flow.  No breakthrough bleeding.  No pain with intercourse.  Status post tubal ligation.  Urine and bowel movements normal.  Cigarette smoking.  Past medical history,surgical history, family history and social history were all reviewed and documented in the EPIC chart.  Gynecologic History No LMP recorded. Contraception: tubal ligation Last Pap: 11/2018. Results were: Negative (Trichomonas pos, patient and husband treated with Flagyl)  Obstetric History OB History  No obstetric history on file.     ROS: A ROS was performed and pertinent positives and negatives are included in the history.  GENERAL: No fevers or chills. HEENT: No change in vision, no earache, sore throat or sinus congestion. NECK: No pain or stiffness. CARDIOVASCULAR: No chest pain or pressure. No palpitations. PULMONARY: No shortness of breath, cough or wheeze. GASTROINTESTINAL: No abdominal pain, nausea, vomiting or diarrhea, melena or bright red blood per rectum. GENITOURINARY: No urinary frequency, urgency, hesitancy or dysuria. MUSCULOSKELETAL: No joint or muscle pain, no back pain, no recent trauma. DERMATOLOGIC: No rash, no itching, no lesions. ENDOCRINE: No polyuria, polydipsia, no heat or cold intolerance. No recent change in weight. HEMATOLOGICAL: No anemia or easy bruising or bleeding. NEUROLOGIC: No headache, seizures, numbness, tingling or weakness. PSYCHIATRIC: No depression, no loss of interest in normal activity or change in sleep pattern.     Exam:   BP 124/78 (BP Location: Right Arm, Patient Position:  Sitting, Cuff Size: Large)   Ht 5\' 3"  (1.6 m)   Wt 268 lb (121.6 kg)   BMI 47.47 kg/m   Body mass index is 47.47 kg/m.  General appearance : Well developed well nourished female. No acute distress  Abdomen: no palpable masses or tenderness, no rebound or guarding   Pelvic: Vulva: Normal             Vagina: No gross lesions or discharge  Cervix: No gross lesions or discharge  Uterus  AV, normal size, shape and consistency, non-tender and mobile  Adnexa  Without masses or tenderness  Anus: Normal   Assessment/Plan:  53 y.o. female for annual exam   1. Menorrhagia with regular cycle Normal gynecologic exam.  Will investigate menorrhagia with a CBC and a TSH today.  Will probably be anemic, recommend iron sulfate 325 mg 1 tablet twice a day.  Follow-up with a pelvic ultrasound to rule out endometrial polyp, submucosal fibroid, endometrial hyperplasia or endometrial cancer.  Will decide on management per results.  Estrogen containing hormone therapy contraindicated given that patient is a cigarette smoker.  Will discuss progestin forms of therapy or other treatment per findings.  Surgical approaches briefly discussed today. - CBC - TSH - US Transvaginal Non-OB; Future  2. Secondary dysmenorrhea Associated with menorrhagia.  3. Cigarette smoker Strongly recommend to quit smoking.  4. Class 3 severe obesity due to excess calories without serious comorbidity with body mass index (BMI) of 45.0 to 49.9 in adult [facility]) Recommend a lower calorie/carb diet such as [employer].  Aerobic physical activities 5 times a week and weightlifting every 2 days.  Counseling on  above issues and coordination of care more than 50% for 30 minutes.  Princess Bruins MD, 2:09 PM 05/26/2019

## 2019-05-29 ENCOUNTER — Other Ambulatory Visit: Payer: Self-pay

## 2019-05-29 ENCOUNTER — Telehealth: Payer: Self-pay | Admitting: *Deleted

## 2019-05-29 ENCOUNTER — Ambulatory Visit
Admission: RE | Admit: 2019-05-29 | Discharge: 2019-05-29 | Disposition: A | Discharge status: Home / Self Care | Payer: Medicare Other | Source: Ambulatory Visit | Attending: Internal Medicine | Admitting: Internal Medicine

## 2019-05-29 DIAGNOSIS — D72828 Other elevated white blood cell count: Secondary | ICD-10-CM

## 2019-05-29 DIAGNOSIS — R7989 Other specified abnormal findings of blood chemistry: Secondary | ICD-10-CM

## 2019-05-29 DIAGNOSIS — Z1239 Encounter for other screening for malignant neoplasm of breast: Secondary | ICD-10-CM

## 2019-05-29 DIAGNOSIS — D582 Other hemoglobinopathies: Secondary | ICD-10-CM

## 2019-05-29 IMAGING — MG DIGITAL SCREENING BILATERAL MAMMOGRAM WITH CAD
4 series · 4 of 4 positions shown · non-contrast
Comparison: Previous exam(s).

ACR Breast Density Category a: The breast tissue is almost entirely
fatty.

CLINICAL DATA: Screening.

EXAM:
DIGITAL SCREENING BILATERAL MAMMOGRAM WITH CAD

[R MLO]
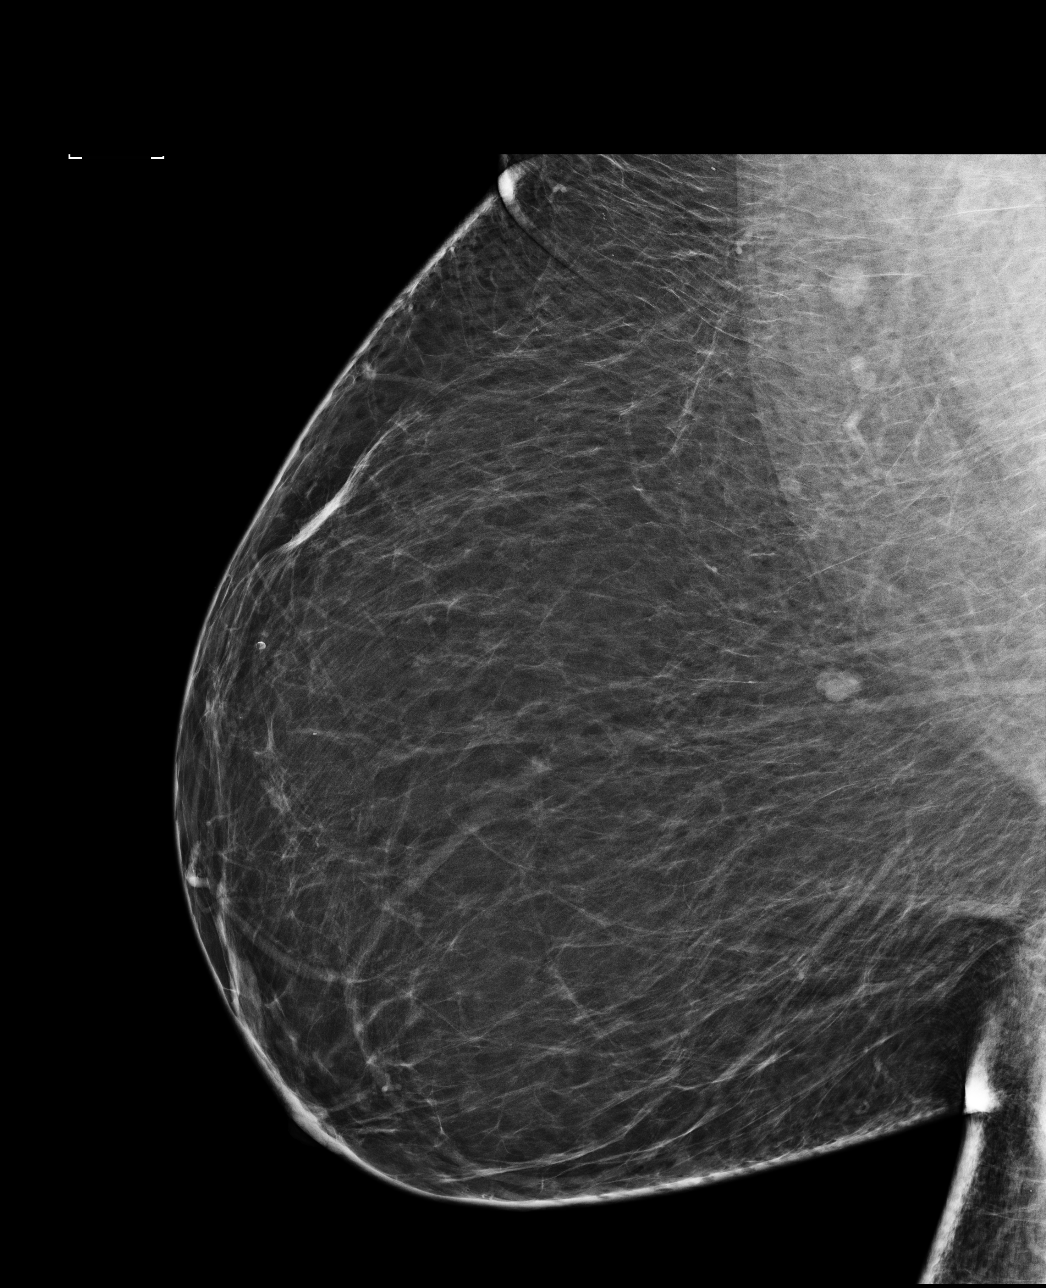

[R CC]
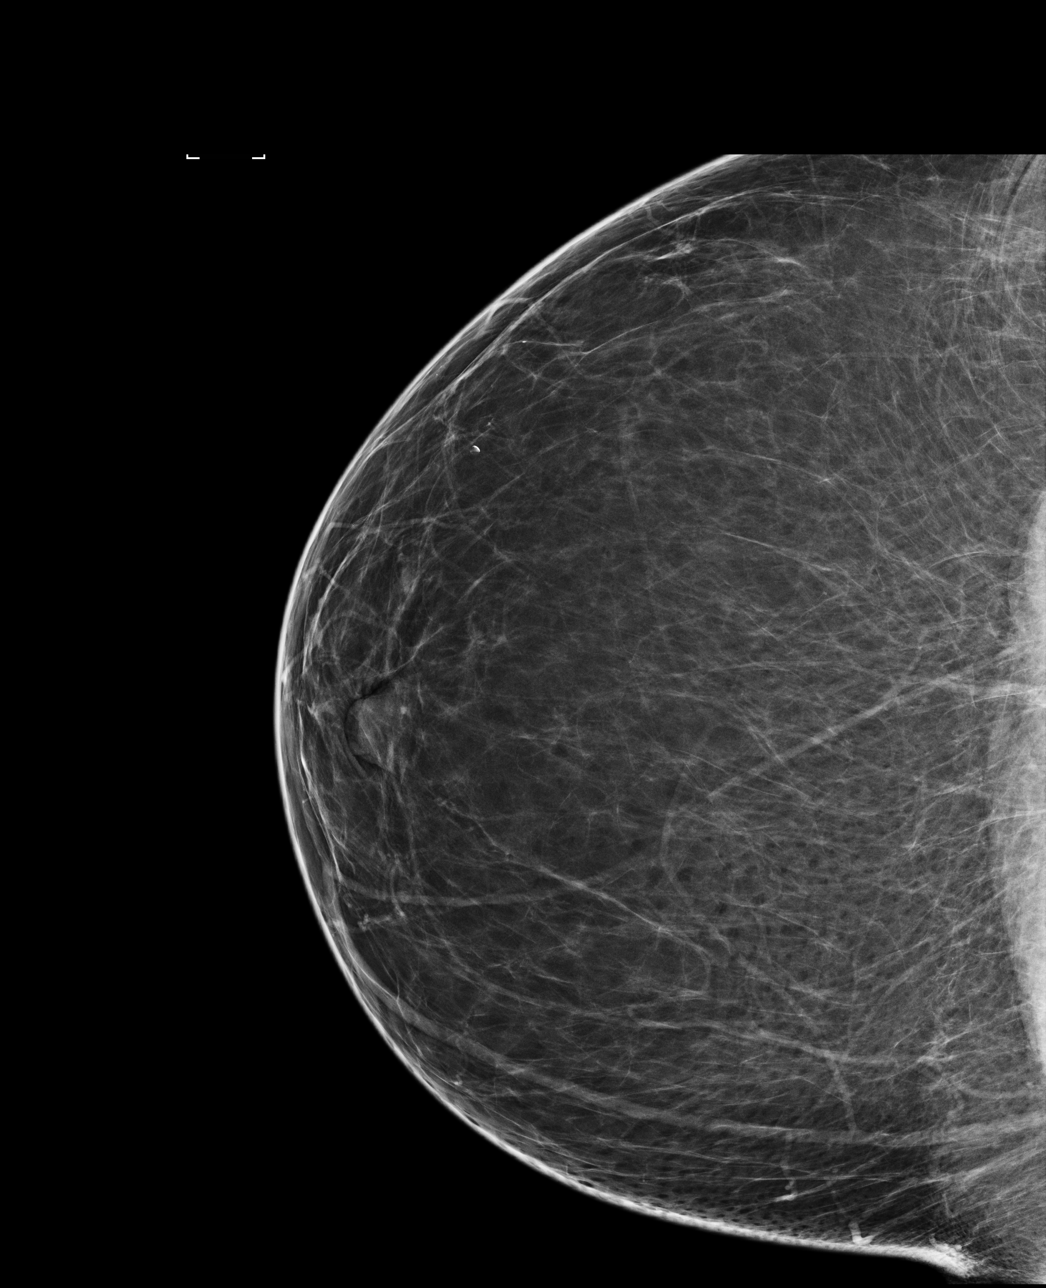

[L MLO]
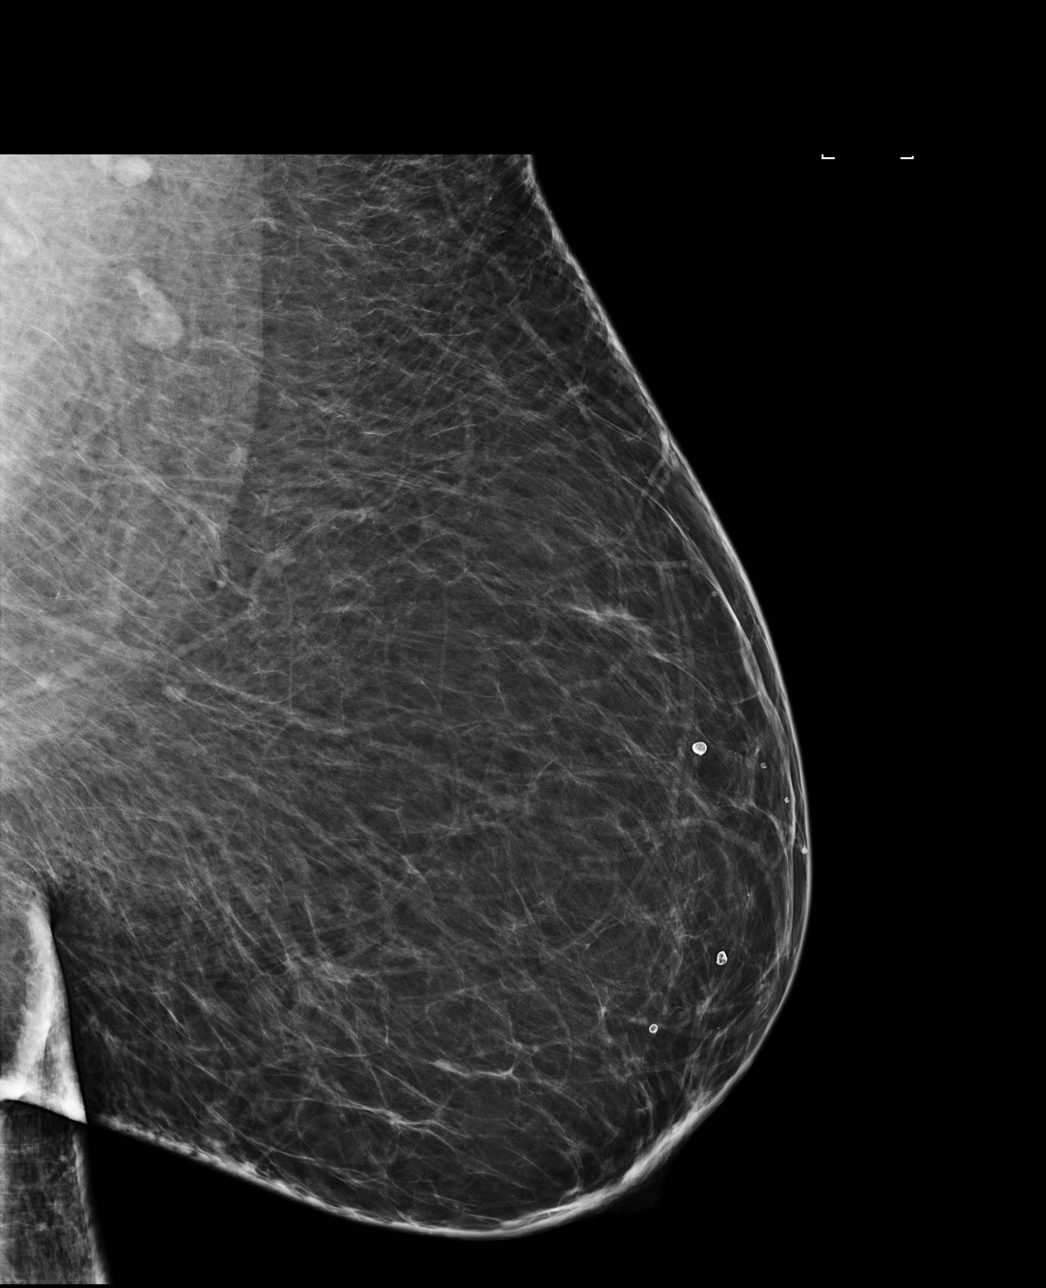

[L CC]
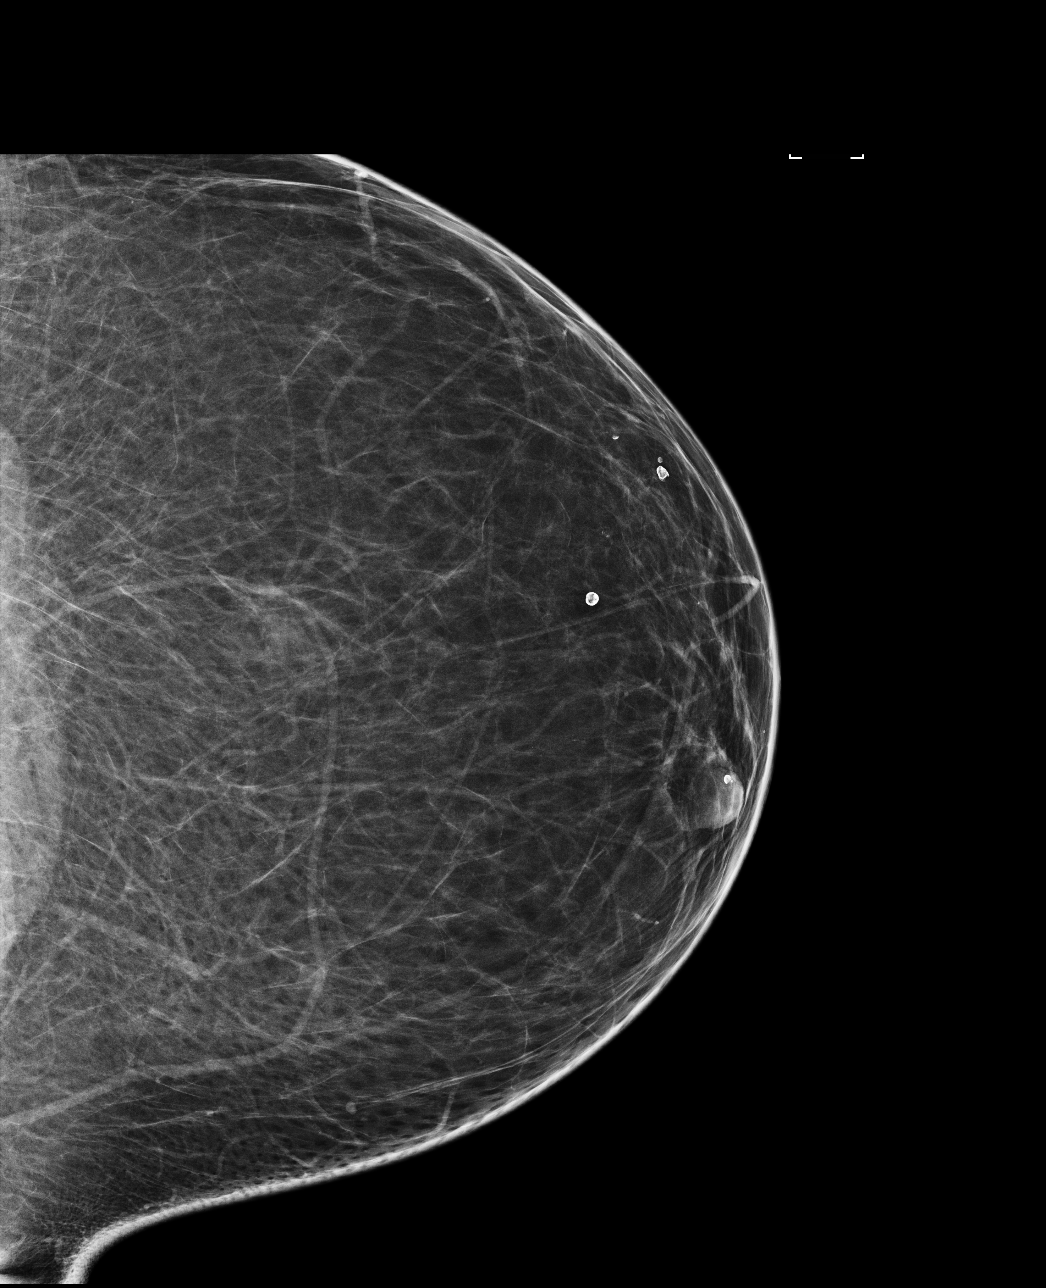

[4 of 4 positions shown; findings below may reference images not displayed]

FINDINGS: There are no findings suspicious for malignancy. Images were
processed with CAD.
IMPRESSION: No mammographic evidence of malignancy. A result letter of this
screening mammogram will be mailed directly to the patient.

RECOMMENDATION:
Screening mammogram in one year. (Code:[0V])

BI-RADS CATEGORY  1: Negative.

## 2019-05-29 NOTE — Telephone Encounter (Signed)
Referral placed at [facility] hematology they will call to schedule.

## 2019-05-29 NOTE — Telephone Encounter (Signed)
-----   Message from Ramond Craver, [place] sent at 05/29/2019 10:11 AM EDT ----- Regarding: referral to hemato Per Dr. Dellis Filbert "CBC Hb, Plt, WBC all elevated.  Refer to [facility]."  Patient knows.  Thanks!

## 2019-05-30 ENCOUNTER — Emergency Department (HOSPITAL_BASED_OUTPATIENT_CLINIC_OR_DEPARTMENT_OTHER)
Admission: EM | Admit: 2019-05-30 | Discharge: 2019-05-30 | Disposition: A | Discharge status: Home / Self Care | Payer: Medicare Other | Attending: Emergency Medicine | Admitting: Emergency Medicine

## 2019-05-30 ENCOUNTER — Emergency Department (HOSPITAL_BASED_OUTPATIENT_CLINIC_OR_DEPARTMENT_OTHER): Payer: Medicare Other

## 2019-05-30 ENCOUNTER — Encounter (HOSPITAL_BASED_OUTPATIENT_CLINIC_OR_DEPARTMENT_OTHER): Payer: Self-pay | Admitting: Emergency Medicine

## 2019-05-30 DIAGNOSIS — Z96642 Presence of left artificial hip joint: Secondary | ICD-10-CM | POA: Diagnosis not present

## 2019-05-30 DIAGNOSIS — Z96651 Presence of right artificial knee joint: Secondary | ICD-10-CM | POA: Diagnosis not present

## 2019-05-30 DIAGNOSIS — R51 Headache: Secondary | ICD-10-CM | POA: Insufficient documentation

## 2019-05-30 DIAGNOSIS — M542 Cervicalgia: Secondary | ICD-10-CM | POA: Insufficient documentation

## 2019-05-30 DIAGNOSIS — F17210 Nicotine dependence, cigarettes, uncomplicated: Secondary | ICD-10-CM | POA: Diagnosis not present

## 2019-05-30 DIAGNOSIS — E119 Type 2 diabetes mellitus without complications: Secondary | ICD-10-CM | POA: Insufficient documentation

## 2019-05-30 DIAGNOSIS — I10 Essential (primary) hypertension: Secondary | ICD-10-CM | POA: Diagnosis not present

## 2019-05-30 DIAGNOSIS — Z79899 Other long term (current) drug therapy: Secondary | ICD-10-CM | POA: Insufficient documentation

## 2019-05-30 DIAGNOSIS — F1210 Cannabis abuse, uncomplicated: Secondary | ICD-10-CM | POA: Insufficient documentation

## 2019-05-30 DIAGNOSIS — R519 Headache, unspecified: Secondary | ICD-10-CM

## 2019-05-30 LAB — CBC WITH DIFFERENTIAL/PLATELET
Abs Immature Granulocytes: 0.07 10*3/uL (ref 0.00–0.07)
Basophils Absolute: 0.1 10*3/uL (ref 0.0–0.1)
Basophils Relative: 1 %
Eosinophils Absolute: 0.3 10*3/uL (ref 0.0–0.5)
Eosinophils Relative: 2 %
HCT: 45.4 % (ref 36.0–46.0)
Hemoglobin: 14.9 g/dL (ref 12.0–15.0)
Immature Granulocytes: 1 %
Lymphocytes Relative: 33 %
Lymphs Abs: 4 10*3/uL (ref 0.7–4.0)
MCH: 30.7 pg (ref 26.0–34.0)
MCHC: 32.8 g/dL (ref 30.0–36.0)
MCV: 93.4 fL (ref 80.0–100.0)
Monocytes Absolute: 0.7 10*3/uL (ref 0.1–1.0)
Monocytes Relative: 6 %
Neutro Abs: 7.2 10*3/uL (ref 1.7–7.7)
Neutrophils Relative %: 57 %
Platelets: 363 10*3/uL (ref 150–400)
RBC: 4.86 MIL/uL (ref 3.87–5.11)
RDW: 13 % (ref 11.5–15.5)
WBC: 12.3 10*3/uL — ABNORMAL HIGH (ref 4.0–10.5)
nRBC: 0 % (ref 0.0–0.2)

## 2019-05-30 LAB — BASIC METABOLIC PANEL
Anion gap: 10 (ref 5–15)
BUN: 11 mg/dL (ref 6–20)
CO2: 26 mmol/L (ref 22–32)
Calcium: 8.8 mg/dL — ABNORMAL LOW (ref 8.9–10.3)
Chloride: 98 mmol/L (ref 98–111)
Creatinine, Ser: 1.04 mg/dL — ABNORMAL HIGH (ref 0.44–1.00)
GFR calc Af Amer: >60 mL/min (ref >60)
GFR calc non Af Amer: >60 mL/min (ref >60)
Glucose, Bld: 111 mg/dL — ABNORMAL HIGH (ref 70–99)
Potassium: 3.9 mmol/L (ref 3.5–5.1)
Sodium: 134 mmol/L — ABNORMAL LOW (ref 135–145)

## 2019-05-30 IMAGING — CT CT ANGIOGRAPHY NECK
1 of 11 series · 5 of 33 positions shown · IV contrast (APPLIED)
Comparison: None.

CLINICAL DATA: Worst headache of life.

EXAM:
CT ANGIOGRAPHY HEAD AND NECK
TECHNIQUE: Multidetector CT imaging of the head and neck was performed using
the standard protocol during bolus administration of intravenous
contrast. Multiplanar CT image reconstructions and MIPs were
obtained to evaluate the vascular anatomy. Carotid stenosis
measurements (when applicable) are obtained utilizing NASCET
criteria, using the distal internal carotid diameter as the
denominator.
CONTRAST:  100mL OMNIPAQUE IOHEXOL 350 MG/ML SOLN

[Series 11: axial thin · axial · 0.38mm/px · z∈[-367,-139]mm · 5 of 342 slices shown]
[im 57/342  soft-tissue]
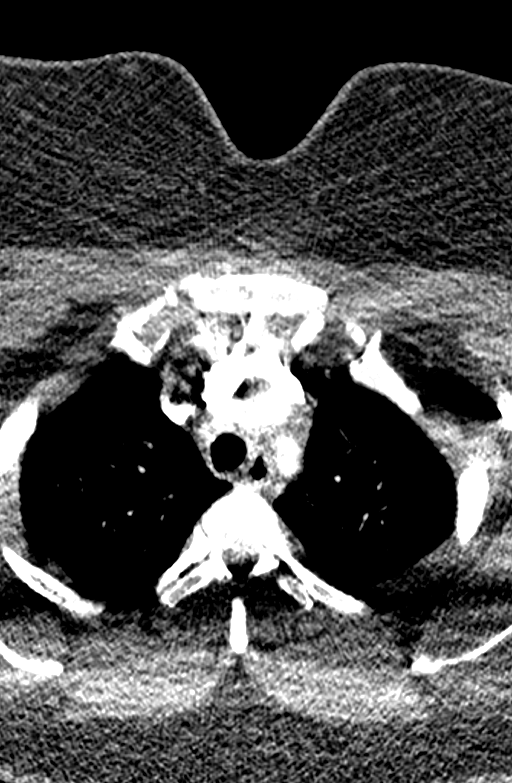
[im 114/342  bone]
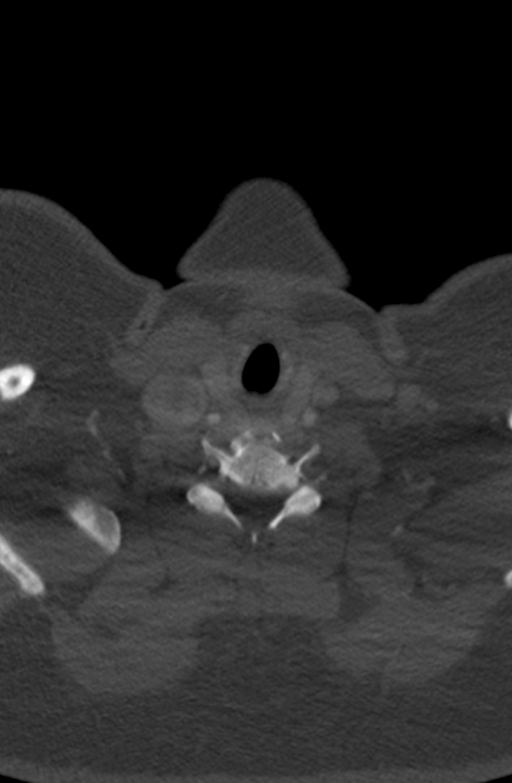
[im 171/342  soft-tissue]
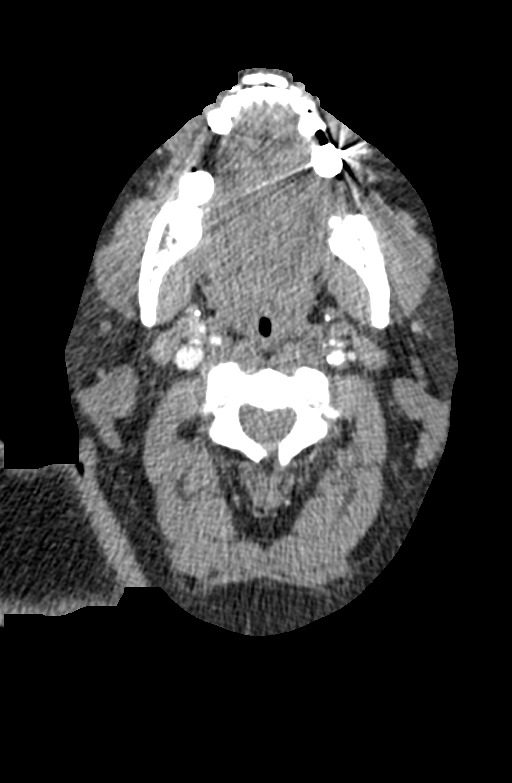
[im 228/342  bone]
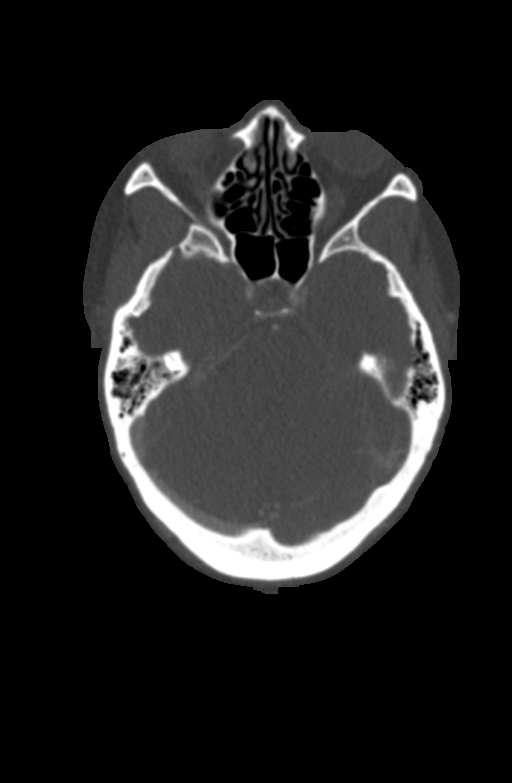
[im 285/342  soft-tissue]
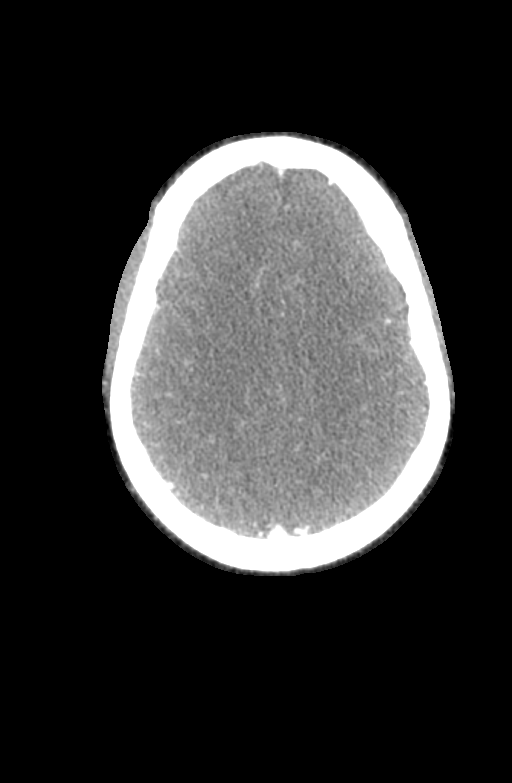

[5 of 33 positions shown; findings below may reference images not displayed]

FINDINGS: CT HEAD FINDINGS

Brain: No evidence of acute infarction, hemorrhage, hydrocephalus,
extra-axial collection or mass lesion/mass effect. Partially empty
sella, incidental finding

Vascular: Negative for hyperdense vessel

Skull: Negative

Sinuses:  negative

Orbits: Negative

Review of the MIP images confirms the above findings

CTA NECK FINDINGS

Aortic arch: Standard branching. Imaged portion shows no evidence of
aneurysm or dissection. No significant stenosis of the major arch
vessel origins.

Right carotid system: Mild atherosclerotic disease right carotid
bifurcation without significant stenosis. No irregularity or
dissection

Left carotid system: Mild atherosclerotic disease left carotid
bifurcation without stenosis. No irregularity or dissection

Vertebral arteries: Both vertebral arteries patent to the basilar
without stenosis or irregularity.

Skeleton: Cervical spine degenerative changes are mild. No acute
skeletal abnormality.

Other neck: Negative for mass or adenopathy in the neck.

Upper chest: No acute abnormality.

Review of the MIP images confirms the above findings

CTA HEAD FINDINGS

Anterior circulation: Cavernous carotid patent bilaterally without
significant stenosis. Anterior and middle cerebral arteries widely
patent bilaterally without stenosis or vascular malformation.

Posterior circulation: Both vertebral arteries patent to the
basilar. PICA patent bilaterally. Basilar widely patent. Superior
cerebellar and posterior cerebral arteries patent bilaterally
without stenosis or vascular malformation.

Venous sinuses: Normal venous enhancement. Right transverse sinus
dominant.

Anatomic variants: None

Delayed phase: Not perform

Review of the MIP images confirms the above findings
IMPRESSION: 1. No acute intracranial abnormality
2. Mild atherosclerotic disease carotid bifurcation bilaterally. No
significant carotid or vertebral artery stenosis in the neck.
3. Negative for intracranial stenosis or vascular malformation.

## 2019-05-30 MED ORDER — IOHEXOL 350 MG/ML SOLN
100.0000 mL | Freq: Once | INTRAVENOUS | Status: AC | PRN
  Administered 2019-05-30: 12:00:00 100 mL via INTRAVENOUS

## 2019-05-30 MED ORDER — METOCLOPRAMIDE HCL 5 MG/ML IJ SOLN
5.0000 mg | Freq: Once | INTRAMUSCULAR | Status: AC
  Administered 2019-05-30: 13:00:00 5 mg via INTRAVENOUS
  Filled 2019-05-30: qty 2

## 2019-05-30 MED ORDER — KETOROLAC TROMETHAMINE 15 MG/ML IJ SOLN
15.0000 mg | Freq: Once | INTRAMUSCULAR | Status: AC
  Administered 2019-05-30: 15 mg via INTRAVENOUS
  Filled 2019-05-30: qty 1

## 2019-05-30 MED ORDER — SODIUM CHLORIDE 0.9 % IV BOLUS
500.0000 mL | Freq: Once | INTRAVENOUS | Status: AC
  Administered 2019-05-30: 500 mL via INTRAVENOUS

## 2019-05-30 MED ORDER — FENTANYL CITRATE (PF) 100 MCG/2ML IJ SOLN
50.0000 ug | INTRAMUSCULAR | Status: DC | PRN
  Administered 2019-05-30: 12:00:00 50 ug via INTRAVENOUS
  Filled 2019-05-30: qty 2

## 2019-05-30 MED ORDER — DIPHENHYDRAMINE HCL 50 MG/ML IJ SOLN
25.0000 mg | Freq: Once | INTRAMUSCULAR | Status: AC
  Administered 2019-05-30: 13:00:00 25 mg via INTRAVENOUS
  Filled 2019-05-30: qty 1

## 2019-05-30 NOTE — ED Provider Notes (Signed)
[facility] EMERGENCY DEPARTMENT Provider Note   CSN: [id number] Arrival date & time: 05/30/19  1003    History   Chief Complaint Chief Complaint  Patient presents with   Headache    HPI Sarah Randall is a 53 y.o. female.     Patient with history of high blood pressure, prediabetes presents with right neck pain and headache.  Patient is headache frontal on Thursday gradual onset and worsening to posterior and right neck on Saturday.  No history of similar.  No fevers chills or infectious symptoms.  Mild worse with movement however no injuries.  Patient denies neurologic complaints except minimal right hand numbness intermittent.     Past Medical History:  Diagnosis Date   Arthritis    Depression    GERD (gastroesophageal reflux disease)    occasionally takes OTC   Hypertension    Pre-diabetes    Sleep apnea    tested 2014 - unable to tolerate machine    Patient Active Problem List   Diagnosis Date Noted   Diabetes mellitus due to underlying condition with unspecified complications ([place]) 09/73/5329   Angina pectoris ([place]) 10/21/2018   Chronic right shoulder pain 07/05/2018   Trochanteric bursitis of left hip 07/05/2018   Morbid obesity ([place]) 07/05/2018   Body mass index 45.0-49.9, adult ([place]) 07/05/2018   Marijuana user 05/26/2018   Status post total right knee replacement 01/12/2018   Status post total hip replacement, left 09/22/2017   Hyperlipidemia 08/10/2017   Primary osteoarthritis of left hip 06/25/2017   Primary osteoarthritis of right knee 06/25/2017   Depression 06/25/2017   Essential hypertension 06/25/2017   Tobacco abuse 06/25/2017   Prediabetes 06/25/2017   OSA on CPAP 06/25/2017   Bronchitis 09/24/2016   Left-sided chest pain 09/24/2016   Leukocytosis 09/24/2016   Radiculopathy, lumbar region 07/16/2016   Degenerative lumbar disc 07/03/2016   Synovitis of hip 04/20/2016   Screening for breast cancer  12/28/2014   Type II diabetes mellitus ([place]) 04/06/2014   Pain in joint involving lower leg 05/13/2013    Past Surgical History:  Procedure Laterality Date   LEFT HEART CATH AND CORONARY ANGIOGRAPHY N/A 10/25/2018   Procedure: LEFT HEART CATH AND CORONARY ANGIOGRAPHY;  Surgeon: Martinique, Peter M, MD;  Location: [facility];  Service: Cardiovascular;  Laterality: N/A;   TOTAL HIP ARTHROPLASTY Left 09/22/2017   Procedure: LEFT TOTAL HIP ARTHROPLASTY ANTERIOR APPROACH;  Surgeon: Leandrew Koyanagi, MD;  Location: [facility];  Service: Orthopedics;  Laterality: Left;   TOTAL KNEE ARTHROPLASTY Right 01/12/2018   Procedure: RIGHT TOTAL KNEE ARTHROPLASTY;  Surgeon: Leandrew Koyanagi, MD;  Location: [facility];  Service: Orthopedics;  Laterality: Right;   TUBAL LIGATION       OB History   No obstetric history on file.      Home Medications    Prior to Admission medications   Medication Sig Start Date End Date Taking? Authorizing Provider  cetirizine (ZYRTEC) 5 MG tablet Take 1 tablet (5 mg total) by mouth daily. In the evening as needed for congestion 02/24/19   Fulp, Cammie, MD  escitalopram (LEXAPRO) 10 MG tablet 1/2 tab PO daily x 1 wk then 1 tab PO ddaily Patient taking differently: Take 10 mg by mouth daily.  10/10/18   Ladell Pier, MD  esomeprazole (NEXIUM) 40 MG capsule TAKE 1 CAPSULE (40 MG TOTAL) BY MOUTH DAILY. 04/12/19   Ladell Pier, MD  hydrochlorothiazide (HYDRODIURIL) 25 MG tablet TAKE 1 TABLET BY  MOUTH DAILY. 04/12/19   Ladell Pier, MD  losartan (COZAAR) 50 MG tablet Take 1 tablet (50 mg total) by mouth daily. 09/08/18   Argentina Donovan, PA-C  meclizine (ANTIVERT) 25 MG tablet 1/2-1 tid prn dizziness Patient taking differently: Take 25 mg by mouth 3 (three) times daily as needed for dizziness. 1 tid prn dizziness 09/08/18   Freeman Caldron M, PA-C  meloxicam (MOBIC) 7.5 MG tablet Take 1 tablet (7.5 mg total) by mouth daily. 04/11/19   Aundra Dubin, PA-C  methocarbamol  (ROBAXIN) 500 MG tablet Take 1 tablet (500 mg total) by mouth at bedtime as needed for muscle spasms. 04/11/19   Aundra Dubin, PA-C  nitroGLYCERIN (NITROSTAT) 0.4 MG SL tablet Place 1 tablet (0.4 mg total) under the tongue every 5 (five) minutes as needed. 10/21/18 05/26/19  Revankar, Reita Cliche, MD  pravastatin (PRAVACHOL) 20 MG tablet 1 tab Q Mon/Wed and Fri Patient taking differently: Take 20 mg by mouth every Monday, Wednesday, and Friday. 1 tab Q Mon/Wed and Fri 09/08/18   Argentina Donovan, PA-C  propranolol (INDERAL) 10 MG tablet Take 1 tablet (10 mg total) by mouth 2 (two) times daily. 10/10/18   Ladell Pier, MD    Family History Family History  Problem Relation Age of Onset   Hypertension Mother    Diabetes Mother    Hypertension Father    Breast cancer Maternal Aunt    Lung cancer Maternal Aunt     Social History Social History   Tobacco Use   Smoking status: Current Some Day Smoker    Packs/day: 0.25    Years: 30.00    Pack years: 7.50    Types: Cigarettes   Smokeless tobacco: Never Used  Substance Use Topics   Alcohol use: Yes    Comment: occasional   Drug use: Yes    Types: Marijuana    Comment: smoke 1 joint cannibis 1 week ago.     Allergies   Lipitor [atorvastatin] and Zoloft [sertraline hcl]   Review of Systems Review of Systems  Constitutional: Negative for chills and fever.  HENT: Negative for congestion.   Eyes: Negative for visual disturbance.  Respiratory: Negative for shortness of breath.   Cardiovascular: Negative for chest pain.  Gastrointestinal: Negative for abdominal pain and vomiting.  Genitourinary: Negative for dysuria and flank pain.  Musculoskeletal: Negative for back pain, neck pain and neck stiffness.  Skin: Negative for rash.  Neurological: Positive for numbness and headaches. Negative for light-headedness.     Physical Exam Updated Vital Signs BP (!) 125/97    Pulse 88    Temp 98.4 F (36.9 C) (Oral)    Resp 16     Ht 5\' 4"  (1.626 m)    Wt 121.6 kg    LMP 05/17/2019    SpO2 100%    BMI 46.00 kg/m   Physical Exam Vitals signs and nursing note reviewed.  Constitutional:      Appearance: She is well-developed.  HENT:     Head: Normocephalic and atraumatic.  Eyes:     General:        Right eye: No discharge.        Left eye: No discharge.     Conjunctiva/sclera: Conjunctivae normal.  Neck:     Musculoskeletal: Normal range of motion and neck supple.     Trachea: No tracheal deviation.  Cardiovascular:     Rate and Rhythm: Normal rate and regular rhythm.  Pulmonary:  Effort: Pulmonary effort is normal.     Breath sounds: Normal breath sounds.  Abdominal:     General: There is no distension.     Palpations: Abdomen is soft.     Tenderness: There is no abdominal tenderness. There is no guarding.  Musculoskeletal:     Comments: Tenderness to palpation right SCM, neck supple  Skin:    General: Skin is warm.     Findings: No rash.  Neurological:     Mental Status: She is alert and oriented to person, place, and time.     GCS: GCS eye subscore is 4. GCS verbal subscore is 5. GCS motor subscore is 6.     Comments: 5+ strength in UE and LE with f/e at major joints. Sensation to palpation intact in UE and LE. CNs 2-12 grossly intact.  EOMFI.  PERRL.   Finger nose and coordination intact bilateral.   Visual fields intact to finger testing. No nystagmus       ED Treatments / Results  Labs (all labs ordered are listed, but only abnormal results are displayed) Labs Reviewed  CBC WITH DIFFERENTIAL/PLATELET - Abnormal; Notable for the following components:      Result Value   WBC 12.3 (*)    All other components within normal limits  BASIC METABOLIC PANEL - Abnormal; Notable for the following components:   Sodium 134 (*)    Glucose, Bld 111 (*)    Creatinine, Ser 1.04 (*)    Calcium 8.8 (*)    All other components within normal limits    EKG None  Radiology Ct Angio Head W Or  Wo Contrast  Result Date: 05/30/2019 CLINICAL DATA:  Worst headache of life. EXAM: CT ANGIOGRAPHY HEAD AND NECK TECHNIQUE: Multidetector CT imaging of the head and neck was performed using the standard protocol during bolus administration of intravenous contrast. Multiplanar CT image reconstructions and MIPs were obtained to evaluate the vascular anatomy. Carotid stenosis measurements (when applicable) are obtained utilizing NASCET criteria, using the distal internal carotid diameter as the denominator. CONTRAST:  162mL OMNIPAQUE IOHEXOL 350 MG/ML SOLN COMPARISON:  None. FINDINGS: CT HEAD FINDINGS Brain: No evidence of acute infarction, hemorrhage, hydrocephalus, extra-axial collection or mass lesion/mass effect. Partially empty sella, incidental finding Vascular: Negative for hyperdense vessel Skull: Negative Sinuses:  negative Orbits: Negative Review of the MIP images confirms the above findings CTA NECK FINDINGS Aortic arch: Standard branching. Imaged portion shows no evidence of aneurysm or dissection. No significant stenosis of the major arch vessel origins. Right carotid system: Mild atherosclerotic disease right carotid bifurcation without significant stenosis. No irregularity or dissection Left carotid system: Mild atherosclerotic disease left carotid bifurcation without stenosis. No irregularity or dissection Vertebral arteries: Both vertebral arteries patent to the basilar without stenosis or irregularity. Skeleton: Cervical spine degenerative changes are mild. No acute skeletal abnormality. Other neck: Negative for mass or adenopathy in the neck. Upper chest: No acute abnormality. Review of the MIP images confirms the above findings CTA HEAD FINDINGS Anterior circulation: Cavernous carotid patent bilaterally without significant stenosis. Anterior and middle cerebral arteries widely patent bilaterally without stenosis or vascular malformation. Posterior circulation: Both vertebral arteries patent to the  basilar. PICA patent bilaterally. Basilar widely patent. Superior cerebellar and posterior cerebral arteries patent bilaterally without stenosis or vascular malformation. Venous sinuses: Normal venous enhancement. Right transverse sinus dominant. Anatomic variants: None Delayed phase: Not perform Review of the MIP images confirms the above findings IMPRESSION: 1. No acute intracranial abnormality 2. Mild atherosclerotic disease  carotid bifurcation bilaterally. No significant carotid or vertebral artery stenosis in the neck. 3. Negative for intracranial stenosis or vascular malformation. Electronically Signed   By: Franchot Gallo M.D.   On: 05/30/2019 12:02   Ct Angio Neck W And/or Wo Contrast  Result Date: 05/30/2019 CLINICAL DATA:  Worst headache of life. EXAM: CT ANGIOGRAPHY HEAD AND NECK TECHNIQUE: Multidetector CT imaging of the head and neck was performed using the standard protocol during bolus administration of intravenous contrast. Multiplanar CT image reconstructions and MIPs were obtained to evaluate the vascular anatomy. Carotid stenosis measurements (when applicable) are obtained utilizing NASCET criteria, using the distal internal carotid diameter as the denominator. CONTRAST:  128mL OMNIPAQUE IOHEXOL 350 MG/ML SOLN COMPARISON:  None. FINDINGS: CT HEAD FINDINGS Brain: No evidence of acute infarction, hemorrhage, hydrocephalus, extra-axial collection or mass lesion/mass effect. Partially empty sella, incidental finding Vascular: Negative for hyperdense vessel Skull: Negative Sinuses:  negative Orbits: Negative Review of the MIP images confirms the above findings CTA NECK FINDINGS Aortic arch: Standard branching. Imaged portion shows no evidence of aneurysm or dissection. No significant stenosis of the major arch vessel origins. Right carotid system: Mild atherosclerotic disease right carotid bifurcation without significant stenosis. No irregularity or dissection Left carotid system: Mild  atherosclerotic disease left carotid bifurcation without stenosis. No irregularity or dissection Vertebral arteries: Both vertebral arteries patent to the basilar without stenosis or irregularity. Skeleton: Cervical spine degenerative changes are mild. No acute skeletal abnormality. Other neck: Negative for mass or adenopathy in the neck. Upper chest: No acute abnormality. Review of the MIP images confirms the above findings CTA HEAD FINDINGS Anterior circulation: Cavernous carotid patent bilaterally without significant stenosis. Anterior and middle cerebral arteries widely patent bilaterally without stenosis or vascular malformation. Posterior circulation: Both vertebral arteries patent to the basilar. PICA patent bilaterally. Basilar widely patent. Superior cerebellar and posterior cerebral arteries patent bilaterally without stenosis or vascular malformation. Venous sinuses: Normal venous enhancement. Right transverse sinus dominant. Anatomic variants: None Delayed phase: Not perform Review of the MIP images confirms the above findings IMPRESSION: 1. No acute intracranial abnormality 2. Mild atherosclerotic disease carotid bifurcation bilaterally. No significant carotid or vertebral artery stenosis in the neck. 3. Negative for intracranial stenosis or vascular malformation. Electronically Signed   By: Franchot Gallo M.D.   On: 05/30/2019 12:02    Procedures Procedures (including critical care time)  Medications Ordered in ED Medications  fentaNYL (SUBLIMAZE) injection 50 mcg (50 mcg Intravenous Given 05/30/19 1152)  sodium chloride 0.9 % bolus 500 mL ( Intravenous Stopped 05/30/19 1225)  iohexol (OMNIPAQUE) 350 MG/ML injection 100 mL (100 mLs Intravenous Contrast Given 05/30/19 1131)  ketorolac (TORADOL) 15 MG/ML injection 15 mg (15 mg Intravenous Given 05/30/19 1324)  metoCLOPramide (REGLAN) injection 5 mg (5 mg Intravenous Given 05/30/19 1328)  diphenhydrAMINE (BENADRYL) injection 25 mg (25 mg  Intravenous Given 05/30/19 1325)     Initial Impression / Assessment and Plan / ED Course  I have reviewed the triage vital signs and the nursing notes.  Pertinent labs & imaging results that were available during my care of the patient were reviewed by me and considered in my medical decision making (see chart for details).       Patient presents with new headache gradual onset and subjective numbness in the right hand.  With right neck pain as well plan for CT angiogram for further details.  Patient does have mild reproducible symptoms.  Discussed with no acute findings on CT we will treat as musculoskeletal  headache.  Patient does have high blood pressure history is risk factor.  Normal neurologic exam.  Headache improved in the emergency room.  CT scan no acute findings. Repeat headache cocktail ordered. Patient stable for outpatient follow-up with primary doctor.  Final Clinical Impressions(s) / ED Diagnoses   Final diagnoses:  Acute intractable headache, unspecified headache type    ED Discharge Orders    None       Elnora Morrison, MD 05/30/19 1345

## 2019-05-30 NOTE — ED Triage Notes (Signed)
Pt c/o HA that started in front on Thur, then to posterior  by Sat, now has pain to neck and RT shoulder

## 2019-05-30 NOTE — Discharge Instructions (Signed)
Take Tylenol and Motrin as needed for pain. Stay well-hydrated. Follow-up with your local doctor for appointment and return to the emergency room for worsening or new symptoms

## 2019-05-31 ENCOUNTER — Telehealth: Payer: Self-pay | Admitting: Internal Medicine

## 2019-05-31 NOTE — Telephone Encounter (Signed)
Received a new hem  Referral fro Dr. Dellis Filbert for elevated platelets/hemoglobin. Pt has been cld and scheduled to see Dr. Walden Field on 7/9 at 930am. She's been made aware to arrive 20 minutes early.

## 2019-06-01 NOTE — Telephone Encounter (Signed)
Patient scheduled on 06/15/19 @ Dr.Higgs

## 2019-06-06 ENCOUNTER — Other Ambulatory Visit: Payer: Self-pay

## 2019-06-07 ENCOUNTER — Encounter: Payer: Self-pay | Admitting: Obstetrics & Gynecology

## 2019-06-07 ENCOUNTER — Ambulatory Visit (INDEPENDENT_AMBULATORY_CARE_PROVIDER_SITE_OTHER): Payer: Medicare Other | Admitting: Obstetrics & Gynecology

## 2019-06-07 ENCOUNTER — Ambulatory Visit (INDEPENDENT_AMBULATORY_CARE_PROVIDER_SITE_OTHER): Payer: Medicare Other

## 2019-06-07 DIAGNOSIS — N920 Excessive and frequent menstruation with regular cycle: Secondary | ICD-10-CM

## 2019-06-07 DIAGNOSIS — Z9851 Tubal ligation status: Secondary | ICD-10-CM | POA: Diagnosis not present

## 2019-06-07 DIAGNOSIS — N945 Secondary dysmenorrhea: Secondary | ICD-10-CM

## 2019-06-07 MED ORDER — NORETHINDRONE 0.35 MG PO TABS: 1.0000 | ORAL_TABLET | Freq: Every day | ORAL | 4 refills | Status: DC

## 2019-06-07 MED FILL — NORETHINDRONE 0.35 MG TAB: 0.35 | 84 days supply | Qty: 84 | Fill #0

## 2019-06-07 NOTE — Progress Notes (Signed)
    Sarah Randall February 13, 1966 [id number]        53 y.o. G2P2L2 Married.  S/P TL.  RP: Menorrhagia/Dysmenorrhea for Pelvic US  HPI: Menorrhagia with menses every 3-4 weeks.  Dysmenorrhea with heavy menses.  No BTB.  No pelvic pain otherwise.   OB History  No obstetric history on file.    Past medical history,surgical history, problem list, medications, allergies, family history and social history were all reviewed and documented in the EPIC chart.   Directed ROS with pertinent positives and negatives documented in the history of present illness/assessment and plan.  Exam:  There were no vitals filed for this visit. General appearance:  Normal  Pelvic US today: T/V images.  Anteverted uterus measuring 11.71 x 5.57 x 5.5 cm.  Intramural fibroids with the largest measured at 2.7 x 1.8 cm and 4 others measuring less than 1.5 cm.  Thin symmetrical endometrial lining measured at 5.0 mm.  No endometrial mass seen.  Both ovaries normal in size with sparse small follicles.  No adnexal mass seen.  No free fluid in the posterior cutis sac.   Assessment/Plan:  53 y.o.   1. Menorrhagia with regular cycle Menorrhagia with regular menstrual periods. Hemoglobin 14.9 on May 30, 2019 (white blood cells, hemoglobin and platelets were all high on CBC May 27, 2019, patient was referred to hematology, consultation pending).  Pelvic ultrasound findings reviewed with patient.  Uterine fibroids present but the largest is 2.7 cm.  The endometrial lining was thin and normal at 5.0 mm.  Both ovaries were normal.  Patient was reassured.  Decision to start on the progestin only birth control pill to control the menstrual flow and dysmenorrhea.  Usage, risks and benefits reviewed with patient.  Prescription sent to pharmacy.  2. Secondary dysmenorrhea As above.  3. S/P tubal ligation  Other orders - norethindrone (MICRONOR) 0.35 MG tablet; Take 1 tablet (0.35 mg total) by mouth daily.  Counseling on above  issues and coordination of care more than 50% for 15 minutes.  Princess Bruins MD, 11:45 AM 06/07/2019

## 2019-06-09 ENCOUNTER — Encounter: Payer: Self-pay | Admitting: Obstetrics & Gynecology

## 2019-06-09 NOTE — Patient Instructions (Signed)
1. Menorrhagia with regular cycle Menorrhagia with regular menstrual periods. Hemoglobin 14.9 on May 30, 2019 (white blood cells, hemoglobin and platelets were all high on CBC May 27, 2019, patient was referred to hematology, consultation pending).  Pelvic ultrasound findings reviewed with patient.  Uterine fibroids present but the largest is 2.7 cm.  The endometrial lining was thin and normal at 5.0 mm.  Both ovaries were normal.  Patient was reassured.  Decision to start on the progestin only birth control pill to control the menstrual flow and dysmenorrhea.  Usage, risks and benefits reviewed with patient.  Prescription sent to pharmacy.  2. Secondary dysmenorrhea As above.  3. S/P tubal ligation  Other orders - norethindrone (MICRONOR) 0.35 MG tablet; Take 1 tablet (0.35 mg total) by mouth daily.  Sarah Randall, it was a pleasure seeing you today!

## 2019-06-15 ENCOUNTER — Encounter: Payer: Self-pay | Admitting: Internal Medicine

## 2019-06-15 ENCOUNTER — Inpatient Hospital Stay: Payer: Medicare Other | Attending: Internal Medicine | Admitting: Internal Medicine

## 2019-06-15 ENCOUNTER — Other Ambulatory Visit: Payer: Self-pay

## 2019-06-15 ENCOUNTER — Inpatient Hospital Stay: Payer: Medicare Other

## 2019-06-15 VITALS — BP 134/89 | HR 98 | Temp 98.7°F | Resp 18 | Ht 64.0 in | Wt 276.2 lb

## 2019-06-15 DIAGNOSIS — D72828 Other elevated white blood cell count: Secondary | ICD-10-CM

## 2019-06-15 DIAGNOSIS — M2550 Pain in unspecified joint: Secondary | ICD-10-CM

## 2019-06-15 DIAGNOSIS — Z803 Family history of malignant neoplasm of breast: Secondary | ICD-10-CM

## 2019-06-15 DIAGNOSIS — N920 Excessive and frequent menstruation with regular cycle: Secondary | ICD-10-CM

## 2019-06-15 DIAGNOSIS — Z801 Family history of malignant neoplasm of trachea, bronchus and lung: Secondary | ICD-10-CM

## 2019-06-15 DIAGNOSIS — F17210 Nicotine dependence, cigarettes, uncomplicated: Secondary | ICD-10-CM

## 2019-06-15 DIAGNOSIS — D539 Nutritional anemia, unspecified: Secondary | ICD-10-CM

## 2019-06-15 DIAGNOSIS — E538 Deficiency of other specified B group vitamins: Secondary | ICD-10-CM

## 2019-06-15 LAB — CBC WITH DIFFERENTIAL (CANCER CENTER ONLY)
Abs Immature Granulocytes: 0.04 10*3/uL (ref 0.00–0.07)
Basophils Absolute: 0.1 10*3/uL (ref 0.0–0.1)
Basophils Relative: 1 %
Eosinophils Absolute: 0.3 10*3/uL (ref 0.0–0.5)
Eosinophils Relative: 2 %
HCT: 43.1 % (ref 36.0–46.0)
Hemoglobin: 14.6 g/dL (ref 12.0–15.0)
Immature Granulocytes: 0 %
Lymphocytes Relative: 31 %
Lymphs Abs: 3.7 10*3/uL (ref 0.7–4.0)
MCH: 30.8 pg (ref 26.0–34.0)
MCHC: 33.9 g/dL (ref 30.0–36.0)
MCV: 90.9 fL (ref 80.0–100.0)
Monocytes Absolute: 0.6 10*3/uL (ref 0.1–1.0)
Monocytes Relative: 5 %
Neutro Abs: 7.4 10*3/uL (ref 1.7–7.7)
Neutrophils Relative %: 61 %
Platelet Count: 359 10*3/uL (ref 150–400)
RBC: 4.74 MIL/uL (ref 3.87–5.11)
RDW: 13.2 % (ref 11.5–15.5)
WBC Count: 12.2 10*3/uL — ABNORMAL HIGH (ref 4.0–10.5)
nRBC: 0 % (ref 0.0–0.2)

## 2019-06-15 LAB — CMP (CANCER CENTER ONLY)
ALT: 16 U/L (ref 0–44)
AST: 17 U/L (ref 15–41)
Albumin: 3.8 g/dL (ref 3.5–5.0)
Alkaline Phosphatase: 84 U/L (ref 38–126)
Anion gap: 9 (ref 5–15)
BUN: 11 mg/dL (ref 6–20)
CO2: 24 mmol/L (ref 22–32)
Calcium: 8.6 mg/dL — ABNORMAL LOW (ref 8.9–10.3)
Chloride: 104 mmol/L (ref 98–111)
Creatinine: 0.94 mg/dL (ref 0.44–1.00)
GFR, Est AFR Am: >60 mL/min (ref >60)
GFR, Estimated: >60 mL/min (ref >60)
Glucose, Bld: 109 mg/dL — ABNORMAL HIGH (ref 70–99)
Potassium: 3.6 mmol/L (ref 3.5–5.1)
Sodium: 137 mmol/L (ref 135–145)
Total Bilirubin: 0.3 mg/dL (ref 0.3–1.2)
Total Protein: 7.5 g/dL (ref 6.5–8.1)

## 2019-06-15 LAB — IRON AND TIBC
Iron: 57 ug/dL (ref 41–142)
Saturation Ratios: 15 % — ABNORMAL LOW (ref 21–57)
TIBC: 378 ug/dL (ref 236–444)
UIBC: 321 ug/dL (ref 120–384)

## 2019-06-15 LAB — VITAMIN B12: Vitamin B-12: 165 pg/mL — ABNORMAL LOW (ref 180–914)

## 2019-06-15 LAB — LACTATE DEHYDROGENASE: LDH: 175 U/L (ref 98–192)

## 2019-06-15 LAB — FOLATE: Folate: 8.3 ng/mL (ref >5.9)

## 2019-06-15 LAB — FERRITIN: Ferritin: 41 ng/mL (ref 11–307)

## 2019-06-15 NOTE — Progress Notes (Signed)
Referring Physician Princess Bruins, MD and Karle Plumber, MD  Diagnosis Other elevated white blood cell (WBC) count - Plan: CBC with Differential ([facility] Only), CMP ([place] only), Lactate dehydrogenase (LDH), BCR ABL1 FISH ([facility]), Ferritin, Iron and TIBC, Vitamin B12, Folate, Serum, Methylmalonic acid, serum, SPEP with reflex to IFE  Nutritional anemia - Plan: CBC with Differential ([facility] Only), CMP ([place] only), Lactate dehydrogenase (LDH), BCR ABL1 FISH ([facility]), Ferritin, Iron and TIBC, Vitamin B12, Folate, Serum, Methylmalonic acid, serum, SPEP with reflex to IFE  Menorrhagia with regular cycle - Plan: CBC with Differential ([facility] Only), CMP ([facility] only), Lactate dehydrogenase (LDH), BCR ABL1 FISH ([facility]), Ferritin, Iron and TIBC, Vitamin B12, Folate, Serum, Methylmalonic acid, serum, SPEP with reflex to IFE  Staging Cancer Staging No matching staging information was found for the patient.  Assessment and Plan:  1.  Elevated WBC, HB and plts.  53 year old female referred for evaluation due to elevated WBC, HB and plts.  Pt reports problems with menorrhagia.  She had pelvic USN done with GYN and was diagnosed with fibroids.  Labs done 05/30/2019 showed WBC 12.3 HB 14.9 plts 363,000.  Chemistries showed K+ 3.9 Cr 1.  Pt reports she is a current smoker.  She denies fevers, night sweats and has noted no adenopathy.  She has not undergone colonoscopy.  Pt is seen today for consultation due to elevated WBC, HB and plts.    Labs done today 06/15/2019 reviewed and showed WBC 12.2 HB 14.6 HCT 43 platelet count 359,000.  Chemistries showed K+ 3.6 Cr 0.94 and normal LFTs.  B12 decreased at 165.  Ferritin 41.    I discussed with pt she has minimal increase in WBC of 12,000 with a normal differential.  Suspect related to smoking. Pt has normal HB of 14.9 and plts of 363,000.   BCR/ABL pending.  Pt will have phone visit follow-up to go over results.   2.   Menorrhagia.  Pt has been seen by GYN and diagnosed with fibroids.  HB WNL at 14.6 with ferritin of 41.  Follow-up with GYN as directed.    3.  B12 deficiency.  B12 level decreased at 165.  Awaiting results of MMA.  Will recommended B12 supplementation and refer to GI at phone follow-up.    4.  Joint pain.  Awaiting results of SPEP.  Follow-up with PCP if ongoing symptoms.    5.  Smoking.  Cessation recommended.    6.  Health maintenance.  Mammogram screenings as recommended.  Will refer to GI.    40 minutes spent with more than 50% spent in review of records, counseling and coordination of care.      Oncology History   No history exists.    HPI:  53 year old female referred for evaluation due to elevated WBC, HB and plts.  Pt reports problems with menorrhagia.  She had pelvic USN done with GYN and was diagnosed with fibroids.  Labs done 05/30/2019 showed WBC 12.3 HB 14.9 plts 363,000.  Chemistries showed K+ 3.9 Cr 1.  Pt reports she is a current smoker.  She denies fevers, night sweats and has noted no adenopathy.  She has not undergone colonoscopy.  Pt is seen today for consultation due to elevated WBC, HB and plts.   Problem List Patient Active Problem List   Diagnosis Date Noted  . Diabetes mellitus due to underlying condition with unspecified complications ([facility]) [O27.0] 10/21/2018  . Angina pectoris ([facility]) [I20.9]  10/21/2018  . Chronic right shoulder pain [M25.511, G89.29] 07/05/2018  . Trochanteric bursitis of left hip [M70.62] 07/05/2018  . Morbid obesity ([place]) [E66.01] 07/05/2018  . Body mass index 45.0-49.9, adult ([facility] [place] [facility]) [Z68.42] 07/05/2018  . Marijuana user [F12.90] 05/26/2018  . Status post total right knee replacement [Z96.651] 01/12/2018  . Status post total hip replacement, left [X91.478] 09/22/2017  . Hyperlipidemia [E78.5] 08/10/2017  . Primary osteoarthritis of left hip [M16.12] 06/25/2017  . Primary osteoarthritis of right knee [M17.11] 06/25/2017  . Depression  [F32.9] 06/25/2017  . Essential hypertension [I10] 06/25/2017  . Tobacco abuse [Z72.0] 06/25/2017  . Prediabetes [R73.03] 06/25/2017  . OSA on CPAP [G47.33, Z99.89] 06/25/2017  . Bronchitis [J40] 09/24/2016  . Left-sided chest pain [R07.9] 09/24/2016  . Leukocytosis [D72.829] 09/24/2016  . Radiculopathy, lumbar region [M54.16] 07/16/2016  . Degenerative lumbar disc [M51.36] 07/03/2016  . Synovitis of hip [M65.9] 04/20/2016  . Screening for breast cancer [Z12.39] 12/28/2014  . Type II diabetes mellitus ([facility]) [E11.9] 04/06/2014  . Pain in joint involving lower leg [M25.569] 05/13/2013    Past Medical History Past Medical History:  Diagnosis Date  . Arthritis   . Depression   . GERD (gastroesophageal reflux disease)    occasionally takes OTC  . Hypertension   . Pre-diabetes   . Sleep apnea    tested 2014 - unable to tolerate machine    Past Surgical History Past Surgical History:  Procedure Laterality Date  . LEFT HEART CATH AND CORONARY ANGIOGRAPHY N/A 10/25/2018   Procedure: LEFT HEART CATH AND CORONARY ANGIOGRAPHY;  Surgeon: Martinique, Peter M, MD;  Location: [facility];  Service: Cardiovascular;  Laterality: N/A;  . TOTAL HIP ARTHROPLASTY Left 09/22/2017   Procedure: LEFT TOTAL HIP ARTHROPLASTY ANTERIOR APPROACH;  Surgeon: Leandrew Koyanagi, MD;  Location: [facility];  Service: Orthopedics;  Laterality: Left;  . TOTAL KNEE ARTHROPLASTY Right 01/12/2018   Procedure: RIGHT TOTAL KNEE ARTHROPLASTY;  Surgeon: Leandrew Koyanagi, MD;  Location: [facility];  Service: Orthopedics;  Laterality: Right;  . TUBAL LIGATION      Family History Family History  Problem Relation Age of Onset  . Hypertension Mother   . Diabetes Mother   . Hypertension Father   . Breast cancer Maternal Aunt   . Lung cancer Maternal Aunt      Social History  reports that she has been smoking cigarettes. She has a 7.50 pack-year smoking history. She has never used smokeless tobacco. She reports current alcohol use.  She reports current drug use. Drug: Marijuana.  Medications  Current Outpatient Medications:  .  cetirizine (ZYRTEC) 5 MG tablet, Take 1 tablet (5 mg total) by mouth daily. In the evening as needed for congestion, Disp: 30 tablet, Rfl: 3 .  escitalopram (LEXAPRO) 10 MG tablet, 1/2 tab PO daily x 1 wk then 1 tab PO ddaily (Patient taking differently: Take 10 mg by mouth daily. Pt states she takes as needed), Disp: 30 tablet, Rfl: 1 .  esomeprazole (NEXIUM) 40 MG capsule, TAKE 1 CAPSULE (40 MG TOTAL) BY MOUTH DAILY., Disp: 30 capsule, Rfl: 2 .  hydrochlorothiazide (HYDRODIURIL) 25 MG tablet, TAKE 1 TABLET BY MOUTH DAILY., Disp: 30 tablet, Rfl: 2 .  losartan (COZAAR) 50 MG tablet, Take 1 tablet (50 mg total) by mouth daily., Disp: 30 tablet, Rfl: 6 .  norethindrone (MICRONOR) 0.35 MG tablet, Take 1 tablet (0.35 mg total) by mouth daily., Disp: 3 Package, Rfl: 4 .  pravastatin (PRAVACHOL) 20 MG tablet, 1 tab Q  Mon/Wed and Fri (Patient taking differently: Take 20 mg by mouth every Monday, Wednesday, and Friday. 1 tab Q Mon/Wed and Fri), Disp: 30 tablet, Rfl: 3 .  propranolol (INDERAL) 10 MG tablet, Take 1 tablet (10 mg total) by mouth 2 (two) times daily., Disp: 60 tablet, Rfl: 2 .  nitroGLYCERIN (NITROSTAT) 0.4 MG SL tablet, Place 1 tablet (0.4 mg total) under the tongue every 5 (five) minutes as needed. (Patient not taking: Reported on 06/15/2019), Disp: 25 tablet, Rfl: 11  Allergies Lipitor [atorvastatin] and Zoloft [sertraline hcl]  Review of Systems Review of Systems - Oncology ROS negative other than joint pain   Physical Exam  Vitals Wt Readings from Last 3 Encounters:  06/15/19 276 lb 3.2 oz (125.3 kg)  05/30/19 268 lb (121.6 kg)  05/26/19 268 lb (121.6 kg)   Temp Readings from Last 3 Encounters:  06/15/19 98.7 F (37.1 C) (Oral)  05/30/19 98.4 F (36.9 C) (Oral)  02/24/19 98.2 F (36.8 C) (Oral)   BP Readings from Last 3 Encounters:  06/15/19 134/89  05/30/19 (!) 125/97   05/26/19 124/78   Pulse Readings from Last 3 Encounters:  06/15/19 98  05/30/19 88  02/24/19 91   Constitutional: Well-developed, well-nourished, and in no distress.   HENT: Head: Normocephalic and atraumatic.  Mouth/Throat: No oropharyngeal exudate. Mucosa moist. Eyes: Pupils are equal, round, and reactive to light. Conjunctivae are normal. No scleral icterus.  Neck: Normal range of motion. Neck supple. No JVD present.  Cardiovascular: Normal rate, regular rhythm and normal heart sounds.  Exam reveals no gallop and no friction rub.   No murmur heard. Pulmonary/Chest: Effort normal and breath sounds normal. No respiratory distress. No wheezes.No rales.  Abdominal: Soft. Bowel sounds are normal. No distension. There is no tenderness. There is no guarding.  Musculoskeletal: No edema or tenderness.  Lymphadenopathy: No cervical,axillary or supraclavicular adenopathy.  Neurological: Alert and oriented to person, place, and time. No cranial nerve deficit.  Skin: Skin is warm and dry. No rash noted. No erythema. No pallor.  Psychiatric: Affect and judgment normal.   Labs Appointment on 06/15/2019  Component Date Value Ref Range Status  . Folate 06/15/2019 8.3  >5.9 ng/mL Final   Performed at [facility] [address] [place], [place] [address]  . Vitamin B-12 06/15/2019 165* 180 - 914 pg/mL Final   Comment: (NOTE) This assay is not validated for testing neonatal or myeloproliferative syndrome specimens for Vitamin B12 levels. Performed at [facility], [place] [address], [place], [place] [address]   . Iron 06/15/2019 57  41 - 142 ug/dL Final  . TIBC 06/15/2019 378  236 - 444 ug/dL Final  . Saturation Ratios 06/15/2019 15* 21 - 57 % Final  . UIBC 06/15/2019 321  120 - 384 ug/dL Final   Performed at [facility], [place] [address] [place], [place] [address]  . Ferritin 06/15/2019 41  11 - 307 ng/mL Final   Performed at  [facility], [place] [address], [place], [place] [address]  . LDH 06/15/2019 175  98 - 192 U/L Final   Performed at [facility], [place] [address], [place], [place] [address]  . Sodium 06/15/2019 137  135 - 145 mmol/L Final  . Potassium 06/15/2019 3.6  3.5 - 5.1 mmol/L Final  . Chloride 06/15/2019 104  98 - 111 mmol/L Final  . CO2 06/15/2019 24  22 - 32 mmol/L Final  . Glucose, Bld  06/15/2019 109* 70 - 99 mg/dL Final  . BUN 06/15/2019 11  6 - 20 mg/dL Final  . Creatinine 06/15/2019 0.94  0.44 - 1.00 mg/dL Final  . Calcium 06/15/2019 8.6* 8.9 - 10.3 mg/dL Final  . Total Protein 06/15/2019 7.5  6.5 - 8.1 g/dL Final  . Albumin 06/15/2019 3.8  3.5 - 5.0 g/dL Final  . AST 06/15/2019 17  15 - 41 U/L Final  . ALT 06/15/2019 16  0 - 44 U/L Final  . Alkaline Phosphatase 06/15/2019 84  38 - 126 U/L Final  . Total Bilirubin 06/15/2019 0.3  0.3 - 1.2 mg/dL Final  . GFR, Est Non Af Am 06/15/2019 >60  >60 mL/min Final  . GFR, Est AFR Am 06/15/2019 >60  >60 mL/min Final  . Anion gap 06/15/2019 9  5 - 15 Final   Performed at [facility], [place] [address], [place], [place] [address]  . WBC Count 06/15/2019 12.2* 4.0 - 10.5 K/uL Final  . RBC 06/15/2019 4.74  3.87 - 5.11 MIL/uL Final  . Hemoglobin 06/15/2019 14.6  12.0 - 15.0 g/dL Final  . HCT 06/15/2019 43.1  36.0 - 46.0 % Final  . MCV 06/15/2019 90.9  80.0 - 100.0 fL Final  . MCH 06/15/2019 30.8  26.0 - 34.0 pg Final  . MCHC 06/15/2019 33.9  30.0 - 36.0 g/dL Final  . RDW 06/15/2019 13.2  11.5 - 15.5 % Final  . Platelet Count 06/15/2019 359  150 - 400 K/uL Final  . nRBC 06/15/2019 0.0  0.0 - 0.2 % Final  . Neutrophils Relative % 06/15/2019 61  % Final  . Neutro Abs 06/15/2019 7.4  1.7 - 7.7 K/uL Final  . Lymphocytes Relative 06/15/2019 31  % Final  . Lymphs Abs 06/15/2019 3.7  0.7 - 4.0 K/uL Final  . Monocytes Relative 06/15/2019 5  % Final  . Monocytes Absolute 06/15/2019 0.6   0.1 - 1.0 K/uL Final  . Eosinophils Relative 06/15/2019 2  % Final  . Eosinophils Absolute 06/15/2019 0.3  0.0 - 0.5 K/uL Final  . Basophils Relative 06/15/2019 1  % Final  . Basophils Absolute 06/15/2019 0.1  0.0 - 0.1 K/uL Final  . Immature Granulocytes 06/15/2019 0  % Final  . Abs Immature Granulocytes 06/15/2019 0.04  0.00 - 0.07 K/uL Final   Performed at [facility], [place] [address], [place], [place] [address]     Pathology Orders Placed This Encounter  Procedures  . CBC with Differential ([facility] Only)    Standing Status:   Future    Number of Occurrences:   1    Standing Expiration Date:   06/14/2020  . CMP ([facility] only)    Standing Status:   Future    Number of Occurrences:   1    Standing Expiration Date:   06/14/2020  . Lactate dehydrogenase (LDH)    Standing Status:   Future    Number of Occurrences:   1    Standing Expiration Date:   06/14/2020  . BCR ABL1 FISH ([facility])    Standing Status:   Future    Number of Occurrences:   1    Standing Expiration Date:   06/14/2020  . Ferritin    Standing Status:   Future    Number of Occurrences:   1    Standing Expiration Date:   06/14/2020  . Iron and TIBC    Standing Status:   Future  Number of Occurrences:   1    Standing Expiration Date:   06/14/2020  . Vitamin B12    Standing Status:   Future    Number of Occurrences:   1    Standing Expiration Date:   06/14/2020  . Folate, Serum    Standing Status:   Future    Number of Occurrences:   1    Standing Expiration Date:   06/14/2020  . Methylmalonic acid, serum    Standing Status:   Future    Number of Occurrences:   1    Standing Expiration Date:   06/14/2020  . SPEP with reflex to IFE    Standing Status:   Future    Number of Occurrences:   1    Standing Expiration Date:   06/14/2020       Zoila Shutter MD

## 2019-06-19 ENCOUNTER — Telehealth: Payer: Self-pay | Admitting: Internal Medicine

## 2019-06-19 LAB — PROTEIN ELECTROPHORESIS, SERUM, WITH REFLEX
A/G Ratio: 1.2 (ref 0.7–1.7)
Albumin ELP: 3.9 g/dL (ref 2.9–4.4)
Alpha-1-Globulin: 0.2 g/dL (ref 0.0–0.4)
Alpha-2-Globulin: 0.7 g/dL (ref 0.4–1.0)
Beta Globulin: 1 g/dL (ref 0.7–1.3)
Gamma Globulin: 1.3 g/dL (ref 0.4–1.8)
Globulin, Total: 3.2 g/dL (ref 2.2–3.9)
Total Protein ELP: 7.1 g/dL (ref 6.0–8.5)

## 2019-06-19 LAB — METHYLMALONIC ACID, SERUM: Methylmalonic Acid, Quantitative: 83 nmol/L (ref 0–378)

## 2019-06-19 NOTE — Telephone Encounter (Signed)
Called and spoke with patient. Confirmed upcoming phone visit

## 2019-06-20 ENCOUNTER — Encounter: Payer: Self-pay | Admitting: Family Medicine

## 2019-06-20 ENCOUNTER — Other Ambulatory Visit: Payer: Self-pay

## 2019-06-20 ENCOUNTER — Ambulatory Visit: Payer: Medicare Other | Attending: Family Medicine | Admitting: Family Medicine

## 2019-06-20 VITALS — BP 132/83 | HR 83 | Ht 64.0 in | Wt 273.2 lb

## 2019-06-20 DIAGNOSIS — E782 Mixed hyperlipidemia: Secondary | ICD-10-CM

## 2019-06-20 DIAGNOSIS — B370 Candidal stomatitis: Secondary | ICD-10-CM

## 2019-06-20 DIAGNOSIS — R7303 Prediabetes: Secondary | ICD-10-CM

## 2019-06-20 DIAGNOSIS — Q383 Other congenital malformations of tongue: Secondary | ICD-10-CM

## 2019-06-20 LAB — POCT GLYCOSYLATED HEMOGLOBIN (HGB A1C): HbA1c, POC (prediabetic range): 6.4 % (ref 5.7–6.4)

## 2019-06-20 MED ORDER — NYSTATIN 100000 UNIT/ML MT SUSP: 5.0000 mL | Freq: Two times a day (BID) | OROMUCOSAL | 0 refills | Status: DC

## 2019-06-20 NOTE — Progress Notes (Signed)
Patient states that she has two bumps on her tongue.  Patient is fasting and has taken her morning medications.

## 2019-06-20 NOTE — Progress Notes (Signed)
Subjective:  Patient ID: Sarah Randall, female    DOB: 30-Dec-1965  Age: 53 y.o. MRN: [id number]  CC: Diabetes   HPI KASSONDRA GEIL is a 53 year old female with a history of hypertension, prediabetes who presents today for an acute visit complaining about lesions on her tongue which she noticed 2 weeks ago.  She had noticed oral thrush initially and had to brush her tongue to get rid of it. Lesions were noticed thereafter and have not been painful.  She denies upper respiratory symptoms, fever, sore throat, lesions in other body parts. She is fasting today in anticipation of labs.  Past Medical History:  Diagnosis Date   Arthritis    Depression    GERD (gastroesophageal reflux disease)    occasionally takes OTC   Hypertension    Pre-diabetes    Sleep apnea    tested 2014 - unable to tolerate machine    Past Surgical History:  Procedure Laterality Date   LEFT HEART CATH AND CORONARY ANGIOGRAPHY N/A 10/25/2018   Procedure: LEFT HEART CATH AND CORONARY ANGIOGRAPHY;  Surgeon: Martinique, Peter M, MD;  Location: [facility];  Service: Cardiovascular;  Laterality: N/A;   TOTAL HIP ARTHROPLASTY Left 09/22/2017   Procedure: LEFT TOTAL HIP ARTHROPLASTY ANTERIOR APPROACH;  Surgeon: Leandrew Koyanagi, MD;  Location: [facility];  Service: Orthopedics;  Laterality: Left;   TOTAL KNEE ARTHROPLASTY Right 01/12/2018   Procedure: RIGHT TOTAL KNEE ARTHROPLASTY;  Surgeon: Leandrew Koyanagi, MD;  Location: [facility];  Service: Orthopedics;  Laterality: Right;   TUBAL LIGATION      Family History  Problem Relation Age of Onset   Hypertension Mother    Diabetes Mother    Hypertension Father    Breast cancer Maternal Aunt    Lung cancer Maternal Aunt     Allergies  Allergen Reactions   Lipitor [Atorvastatin] Other (See Comments)    Stabbing pains in legs   Zoloft [Sertraline Hcl] Other (See Comments)    tremors    Outpatient Medications Prior to Visit  Medication Sig Dispense Refill     cetirizine (ZYRTEC) 5 MG tablet Take 1 tablet (5 mg total) by mouth daily. In the evening as needed for congestion 30 tablet 3   escitalopram (LEXAPRO) 10 MG tablet 1/2 tab PO daily x 1 wk then 1 tab PO ddaily (Patient taking differently: Take 10 mg by mouth daily. Pt states she takes as needed) 30 tablet 1   esomeprazole (NEXIUM) 40 MG capsule TAKE 1 CAPSULE (40 MG TOTAL) BY MOUTH DAILY. 30 capsule 2   hydrochlorothiazide (HYDRODIURIL) 25 MG tablet TAKE 1 TABLET BY MOUTH DAILY. 30 tablet 2   losartan (COZAAR) 50 MG tablet Take 1 tablet (50 mg total) by mouth daily. 30 tablet 6   norethindrone (MICRONOR) 0.35 MG tablet Take 1 tablet (0.35 mg total) by mouth daily. 3 Package 4   pravastatin (PRAVACHOL) 20 MG tablet 1 tab Q Mon/Wed and Fri (Patient taking differently: Take 20 mg by mouth every Monday, Wednesday, and Friday. 1 tab Q Mon/Wed and Fri) 30 tablet 3   propranolol (INDERAL) 10 MG tablet Take 1 tablet (10 mg total) by mouth 2 (two) times daily. 60 tablet 2   nitroGLYCERIN (NITROSTAT) 0.4 MG SL tablet Place 1 tablet (0.4 mg total) under the tongue every 5 (five) minutes as needed. (Patient not taking: Reported on 06/15/2019) 25 tablet 11   No facility-administered medications prior to visit.      ROS Review of  Systems  Constitutional: Negative for activity change, appetite change and fatigue.  HENT: Negative for congestion, sinus pressure and sore throat.   Eyes: Negative for visual disturbance.  Respiratory: Negative for cough, chest tightness, shortness of breath and wheezing.   Cardiovascular: Negative for chest pain and palpitations.  Gastrointestinal: Negative for abdominal distention, abdominal pain and constipation.  Endocrine: Negative for polydipsia.  Genitourinary: Negative for dysuria and frequency.  Musculoskeletal: Negative for arthralgias and back pain.  Skin: Negative for rash.  Neurological: Negative for tremors, light-headedness and numbness.   Hematological: Does not bruise/bleed easily.  Psychiatric/Behavioral: Negative for agitation and behavioral problems.    Objective:  BP 132/83    Pulse 83    Ht '5\' 4"'$  (1.626 m)    Wt 273 lb 3.2 oz (123.9 kg)    SpO2 97%    BMI 46.89 kg/m   BP/Weight 06/20/2019 06/15/2019 1/88/4166  Systolic BP 063 016 010  Diastolic BP 83 89 97  Wt. (Lbs) 273.2 276.2 268  BMI 46.89 47.41 46      Physical Exam Constitutional:      Appearance: She is well-developed.  HENT:     Mouth/Throat:     Tongue: Lesions (four noduled on posterior tongue, normal pigmentation. No thrush noticed) present.  Cardiovascular:     Rate and Rhythm: Normal rate.     Heart sounds: Normal heart sounds. No murmur.  Pulmonary:     Effort: Pulmonary effort is normal.     Breath sounds: Normal breath sounds. No wheezing or rales.  Chest:     Chest wall: No tenderness.  Abdominal:     General: Bowel sounds are normal. There is no distension.     Palpations: Abdomen is soft. There is no mass.     Tenderness: There is no abdominal tenderness.  Musculoskeletal: Normal range of motion.  Neurological:     Mental Status: She is alert and oriented to person, place, and time.     CMP Latest Ref Rng & Units 06/15/2019 05/30/2019 10/21/2018  Glucose 70 - 99 mg/dL 109(H) 111(H) 106(H)  BUN 6 - 20 mg/dL '11 11 11  '$ Creatinine 0.44 - 1.00 mg/dL 0.94 1.04(H) 0.90  Sodium 135 - 145 mmol/L 137 134(L) 135  Potassium 3.5 - 5.1 mmol/L 3.6 3.9 3.6  Chloride 98 - 111 mmol/L 104 98 98  CO2 22 - 32 mmol/L '24 26 21  '$ Calcium 8.9 - 10.3 mg/dL 8.6(L) 8.8(L) 9.9  Total Protein 6.5 - 8.1 g/dL 7.5 - -  Total Bilirubin 0.3 - 1.2 mg/dL 0.3 - -  Alkaline Phos 38 - 126 U/L 84 - -  AST 15 - 41 U/L 17 - -  ALT 0 - 44 U/L 16 - -    Lipid Panel     Component Value Date/Time   CHOL 250 (H) 06/25/2017 1511   TRIG 262 (H) 06/25/2017 1511   HDL 40 06/25/2017 1511   CHOLHDL 6.3 (H) 06/25/2017 1511   LDLCALC 158 (H) 06/25/2017 1511    CBC     Component Value Date/Time   WBC 12.2 (H) 06/15/2019 1012   WBC 12.3 (H) 05/30/2019 1130   RBC 4.74 06/15/2019 1012   HGB 14.6 06/15/2019 1012   HGB 14.8 11/14/2018 1546   HCT 43.1 06/15/2019 1012   HCT 42.7 11/14/2018 1546   PLT 359 06/15/2019 1012   PLT 420 11/14/2018 1546   MCV 90.9 06/15/2019 1012   MCV 91 11/14/2018 1546   MCH 30.8 06/15/2019 1012  MCHC 33.9 06/15/2019 1012   RDW 13.2 06/15/2019 1012   RDW 12.7 11/14/2018 1546   LYMPHSABS 3.7 06/15/2019 1012   LYMPHSABS 5.5 (H) 11/14/2018 1546   MONOABS 0.6 06/15/2019 1012   EOSABS 0.3 06/15/2019 1012   EOSABS 0.3 11/14/2018 1546   BASOSABS 0.1 06/15/2019 1012   BASOSABS 0.1 11/14/2018 1546    Lab Results  Component Value Date   HGBA1C 6.4 06/20/2019    Assessment & Plan:   1. Prediabetes A1c of 6.4 Diabetic diet, lifestyle modifications We will need to discuss possible initiation of metformin at her upcoming follow-up for medical conditions - POCT glycosylated hemoglobin (Hb A1C) - CMP14+EGFR - Lipid panel  2. Tongue abnormality We will need to include malignancy - Ambulatory referral to Dentistry  3. Thrush Absent on exam She is currently not immunocompromised - nystatin (MYCOSTATIN) 100000 UNIT/ML suspension; Take 5 mLs (500,000 Units total) by mouth 2 (two) times a day.  Dispense: 60 mL; Refill: 0  4. Mixed hyperlipidemia Lipid panel ordered   Health Care Maintenance: Return for follow-up with PCP to address this  Meds ordered this encounter  Medications   nystatin (MYCOSTATIN) 100000 UNIT/ML suspension    Sig: Take 5 mLs (500,000 Units total) by mouth 2 (two) times a day.    Dispense:  60 mL    Refill:  0    Follow-up: Return in about 6 weeks (around 08/01/2019) for medical conditions with PCP.       Charlott Rakes, MD, FAAFP. [facility] [place], [place]   06/20/2019, 12:12 PM

## 2019-06-20 NOTE — Patient Instructions (Signed)
Oral Thrush, Adult  Oral thrush is an infection in your mouth and throat. It causes white patches on your tongue and in your mouth. Follow these instructions at home: Helping with soreness   To lessen your pain: ? Drink cold liquids, like water and iced tea. ? Eat frozen ice pops or frozen juices. ? Eat foods that are easy to swallow, like gelatin and ice cream. ? Drink from a straw if the patches in your mouth are painful. General instructions  Take or use over-the-counter and prescription medicines only as told by your doctor. Medicine for oral thrush may be something to swallow, or it may be something to put on the infected area.  Eat plain yogurt that has live cultures in it. Read the label to make sure.  If you wear dentures: ? Take out your dentures before you go to bed. ? Brush them well. ? Soak them in a denture cleaner.  Rinse your mouth with warm salt-water many times a day. To make the salt-water mixture, completely dissolve 1/2-1 teaspoon of salt in 1 cup of warm water. Contact a doctor if:  Your problems are getting worse.  Your problems do not get better in less than 7 days with treatment.  Your infection is spreading. This may show as white patches on the skin outside of your mouth.  You are nursing your baby and you have redness and pain in the nipples. This information is not intended to replace advice given to you by your health care provider. Make sure you discuss any questions you have with your health care provider. Document Released: 02/17/2010 Document Revised: 02/25/2018 Document Reviewed: 08/17/2016 Elsevier Patient Education  2020 [employer].

## 2019-06-21 ENCOUNTER — Other Ambulatory Visit: Payer: Self-pay | Admitting: Family Medicine

## 2019-06-21 LAB — LIPID PANEL
Chol/HDL Ratio: 6.7 ratio — ABNORMAL HIGH (ref 0.0–4.4)
Cholesterol, Total: 280 mg/dL — ABNORMAL HIGH (ref 100–199)
HDL: 42 mg/dL (ref >39)
LDL Calculated: 202 mg/dL — ABNORMAL HIGH (ref 0–99)
Triglycerides: 181 mg/dL — ABNORMAL HIGH (ref 0–149)
VLDL Cholesterol Cal: 36 mg/dL (ref 5–40)

## 2019-06-21 LAB — CMP14+EGFR
ALT: 11 IU/L (ref 0–32)
AST: 16 IU/L (ref 0–40)
Albumin/Globulin Ratio: 1.5 (ref 1.2–2.2)
Albumin: 4.4 g/dL (ref 3.8–4.9)
Alkaline Phosphatase: 94 IU/L (ref 39–117)
BUN/Creatinine Ratio: 13 (ref 9–23)
BUN: 12 mg/dL (ref 6–24)
Bilirubin Total: <0.2 mg/dL (ref 0.0–1.2)
CO2: 21 mmol/L (ref 20–29)
Calcium: 9.4 mg/dL (ref 8.7–10.2)
Chloride: 100 mmol/L (ref 96–106)
Creatinine, Ser: 0.92 mg/dL (ref 0.57–1.00)
GFR calc Af Amer: 83 mL/min/{1.73_m2} (ref >59)
GFR calc non Af Amer: 72 mL/min/{1.73_m2} (ref >59)
Globulin, Total: 2.9 g/dL (ref 1.5–4.5)
Glucose: 105 mg/dL — ABNORMAL HIGH (ref 65–99)
Potassium: 4 mmol/L (ref 3.5–5.2)
Sodium: 138 mmol/L (ref 134–144)
Total Protein: 7.3 g/dL (ref 6.0–8.5)

## 2019-06-23 ENCOUNTER — Other Ambulatory Visit: Payer: Self-pay | Admitting: Physician Assistant

## 2019-06-23 DIAGNOSIS — I10 Essential (primary) hypertension: Secondary | ICD-10-CM

## 2019-06-23 MED FILL — HYDROCHLOROTHIAZIDE 25 MG T: 25 | 30 days supply | Qty: 30 | Fill #2

## 2019-06-26 MED FILL — LOSARTAN POTASSIUM 50 MG TA: 50 | 30 days supply | Qty: 30 | Fill #0

## 2019-06-30 ENCOUNTER — Other Ambulatory Visit: Payer: Self-pay

## 2019-06-30 ENCOUNTER — Encounter: Payer: Self-pay | Admitting: Internal Medicine

## 2019-06-30 ENCOUNTER — Inpatient Hospital Stay: Payer: Medicare Other | Admitting: Internal Medicine

## 2019-06-30 ENCOUNTER — Inpatient Hospital Stay (HOSPITAL_BASED_OUTPATIENT_CLINIC_OR_DEPARTMENT_OTHER): Payer: Medicare Other | Admitting: Internal Medicine

## 2019-06-30 ENCOUNTER — Encounter: Payer: Self-pay | Admitting: Physician Assistant

## 2019-06-30 DIAGNOSIS — E538 Deficiency of other specified B group vitamins: Secondary | ICD-10-CM

## 2019-06-30 DIAGNOSIS — D72828 Other elevated white blood cell count: Secondary | ICD-10-CM | POA: Diagnosis not present

## 2019-06-30 DIAGNOSIS — N920 Excessive and frequent menstruation with regular cycle: Secondary | ICD-10-CM

## 2019-06-30 DIAGNOSIS — F17210 Nicotine dependence, cigarettes, uncomplicated: Secondary | ICD-10-CM | POA: Diagnosis not present

## 2019-06-30 HISTORY — DX: Deficiency of other specified B group vitamins: E53.8

## 2019-06-30 NOTE — Progress Notes (Signed)
Error   This encounter was created in error - please disregard. 

## 2019-07-03 ENCOUNTER — Telehealth: Payer: Self-pay | Admitting: Internal Medicine

## 2019-07-03 ENCOUNTER — Other Ambulatory Visit: Payer: Self-pay | Admitting: Internal Medicine

## 2019-07-03 DIAGNOSIS — G250 Essential tremor: Secondary | ICD-10-CM

## 2019-07-03 DIAGNOSIS — G43011 Migraine without aura, intractable, with status migrainosus: Secondary | ICD-10-CM

## 2019-07-03 MED FILL — HYDROCHLOROTHIAZIDE 25 MG T: 25 | 30 days supply | Qty: 30 | Fill #2

## 2019-07-03 MED FILL — PROPRANOLOL 10 MG TABLET: 10 | 30 days supply | Qty: 60 | Fill #0

## 2019-07-03 MED FILL — PRAVASTATIN SODIUM 20 MG TA: 20 | 30 days supply | Qty: 12 | Fill #4

## 2019-07-03 NOTE — Telephone Encounter (Signed)
Called and spoke with patient. Confirmed dates and times °

## 2019-07-06 NOTE — Progress Notes (Signed)
Virtual Visit via Telephone Note  I connected with Sarah Randall on 07/06/19 at  9:10 AM EDT by telephone and verified that I am speaking with the correct person using two identifiers.   I discussed the limitations, risks, security and privacy concerns of performing an evaluation and management service by telephone and the availability of in person appointments. I also discussed with the patient that there may be a patient responsible charge related to this service. The patient expressed understanding and agreed to proceed.  Interval History:  Historical data obtained from note dated 06/15/2019.   53 year old female referred for evaluation due to elevated WBC, HB and plts.  Pt reports problems with menorrhagia.  She had pelvic USN done with GYN and was diagnosed with fibroids.  Labs done 05/30/2019 showed WBC 12.3 HB 14.9 plts 363,000.  Chemistries showed K+ 3.9 Cr 1.  Pt reports she is a current smoker.  She denies fevers, night sweats and has noted no adenopathy.  She has not undergone colonoscopy.   Observations/Objective: Review of labs.     Assessment and Plan: 1.  Elevated WBC, HB and plts.  53 year old female referred for evaluation due to elevated WBC, HB and plts.  Pt reports problems with menorrhagia.  She had pelvic USN done with GYN and was diagnosed with fibroids.  Labs done 05/30/2019 showed WBC 12.3 HB 14.9 plts 363,000.  Chemistries showed K+ 3.9 Cr 1.  Pt reports she is a current smoker.  She denies fevers, night sweats and has noted no adenopathy.  She has not undergone colonoscopy.    Labs done 06/15/2019 reviewed and showed WBC 12.2 HB 14.6 HCT 43 platelet count 359,000.  Chemistries showed K+ 3.6 Cr 0.94 and normal LFTs.  B12 decreased at 165.  Ferritin 41.    I discussed with pt she has minimal increase in WBC of 12,000 with a normal differential.  Suspect related to smoking. Pt has normal HB of 14.9 and plts of 363,000.   BCR/ABL negative.  Pt will follow-up 11/2019 with labs.   2.   Menorrhagia.  Pt has been seen by GYN and diagnosed with fibroids.  HB WNL at 14.6 with ferritin of 41.  Follow-up with GYN as directed.    3.  B12 deficiency.  B12 level decreased at 165.  She has normal MMA.  Pt will start B12 1000 MCG monthly injections in clinic.  She is referred to GI.  Pt will have labs and follow-up in 11/2019.    4.  Joint pain.  She has negative SPEP.  Follow-up with PCP if ongoing symptoms.    5.  Smoking.  Cessation recommended.    6.  Health maintenance.  Mammogram screenings as recommended.  Will refer to GI.     Follow Up Instructions: monthly B12 injections.  GI referral.  Labs and follow-up in 11/2019.      I discussed the assessment and treatment plan with the patient. The patient was provided an opportunity to ask questions and all were answered. The patient agreed with the plan and demonstrated an understanding of the instructions.   The patient was advised to call back or seek an in-person evaluation if the symptoms worsen or if the condition fails to improve as anticipated.  I provided 15 minutes of non-face-to-face time during this encounter.   Zoila Shutter, MD

## 2019-07-18 LAB — BCR ABL1 FISH (GENPATH)

## 2019-07-24 ENCOUNTER — Encounter: Payer: Self-pay | Admitting: Physician Assistant

## 2019-07-24 ENCOUNTER — Ambulatory Visit (INDEPENDENT_AMBULATORY_CARE_PROVIDER_SITE_OTHER): Payer: Medicare Other | Admitting: Physician Assistant

## 2019-07-24 VITALS — BP 136/80 | HR 82 | Temp 98.6°F | Ht 64.0 in | Wt 276.4 lb

## 2019-07-24 DIAGNOSIS — E611 Iron deficiency: Secondary | ICD-10-CM | POA: Diagnosis not present

## 2019-07-24 DIAGNOSIS — K219 Gastro-esophageal reflux disease without esophagitis: Secondary | ICD-10-CM | POA: Diagnosis not present

## 2019-07-24 MED ORDER — NA SULFATE-K SULFATE-MG SULF 17.5-3.13-1.6 GM/177ML PO SOLN: ORAL | 0 refills | Status: DC

## 2019-07-24 MED ORDER — POLYSACCHARIDE IRON COMPLEX 150 MG PO CAPS: 150.0000 mg | ORAL_CAPSULE | Freq: Every day | ORAL | 3 refills | Status: DC

## 2019-07-24 NOTE — Progress Notes (Signed)
Subjective:    Patient ID: Sarah Randall, female    DOB: [birth year], 53 y.o.   MRN: [id number]  HPI Merelin is a pleasant 53 year old African-American female, new to GI today and referred by hematology/Vetta Higgs MD for iron deficiency. Patient has not had any prior GI evaluation.  She does have history of hypertension, adult onset diabetes mellitus, morbid obesity, sleep apnea and a radiculopathy. She had had recent labs done by her PCP and was subsequently referred to hematology for elevated WBC and hemoglobin.  Further labs revealed a B12 deficiency and she is starting injection replacement therapy this week.  Also noted to have a ferritin of 41, serum iron 57, TIBC 378 and iron saturation 15. Patient has not noticed any melena or hematochezia.  She denies any abdominal pain, cramping, changes in bowel habits and denies any difficulty with diarrhea or constipation.  She does take Nexium 40 mg p.o. daily and has been on this for several years for reflux symptoms.  She says this works well.  No complaints of dysphagia or odynophagia. Does have history of menorrhagia and previously documented fibroids.  She generally has a menstrual.  Lasting about 7 days with heavy bleeding requiring wearing 2-3 pads at bedtime.  She has been placed on a birth control pill per GYN without any significant change thus far. Family history is pertinent for brother with polyps, no family history of colon cancer that she is aware of.  Review of Systems Pertinent positive and negative review of systems were noted in the above HPI section.  All other review of systems was otherwise negative.  Outpatient Encounter Medications as of 07/24/2019  Medication Sig  . cetirizine (ZYRTEC) 5 MG tablet Take 1 tablet (5 mg total) by mouth daily. In the evening as needed for congestion  . escitalopram (LEXAPRO) 10 MG tablet 1/2 tab PO daily x 1 wk then 1 tab PO ddaily (Patient taking differently: Take 10 mg by mouth daily. Pt states  she takes as needed)  . esomeprazole (NEXIUM) 40 MG capsule TAKE 1 CAPSULE (40 MG TOTAL) BY MOUTH DAILY.  . hydrochlorothiazide (HYDRODIURIL) 25 MG tablet TAKE 1 TABLET BY MOUTH DAILY.  Marland Kitchen losartan (COZAAR) 50 MG tablet TAKE 1 TABLET (50 MG TOTAL) BY MOUTH DAILY.  Marland Kitchen norethindrone (MICRONOR) 0.35 MG tablet Take 1 tablet (0.35 mg total) by mouth daily.  Marland Kitchen nystatin (MYCOSTATIN) 100000 UNIT/ML suspension Take 5 mLs (500,000 Units total) by mouth 2 (two) times a day.  . pravastatin (PRAVACHOL) 20 MG tablet 1 tab Q Mon/Wed and Fri (Patient taking differently: Take 20 mg by mouth every Monday, Wednesday, and Friday. 1 tab Q Mon/Wed and Fri)  . propranolol (INDERAL) 10 MG tablet TAKE 1 TABLET (10 MG TOTAL) BY MOUTH 2 (TWO) TIMES DAILY.  . iron polysaccharides (NU-IRON) 150 MG capsule Take 1 capsule (150 mg total) by mouth daily.  . Na Sulfate-K Sulfate-Mg Sulf 17.5-3.13-1.6 GM/177ML SOLN Suprep-Use as directed  . nitroGLYCERIN (NITROSTAT) 0.4 MG SL tablet Place 1 tablet (0.4 mg total) under the tongue every 5 (five) minutes as needed. (Patient not taking: Reported on 06/15/2019)   No facility-administered encounter medications on file as of 07/24/2019.    Allergies  Allergen Reactions  . Lipitor [Atorvastatin] Other (See Comments)    Stabbing pains in legs  . Zoloft [Sertraline Hcl] Other (See Comments)    tremors   Patient Active Problem List   Diagnosis Date Noted  . B12 deficiency 06/30/2019  . Diabetes mellitus due  to underlying condition with unspecified complications ([place]) 29/47/6546  . Angina pectoris ([place]) 10/21/2018  . Chronic right shoulder pain 07/05/2018  . Trochanteric bursitis of left hip 07/05/2018  . Morbid obesity ([place]) 07/05/2018  . Body mass index 45.0-49.9, adult ([place]) 07/05/2018  . Marijuana user 05/26/2018  . Status post total right knee replacement 01/12/2018  . Status post total hip replacement, left 09/22/2017  . Hyperlipidemia 08/10/2017  . Primary osteoarthritis of  left hip 06/25/2017  . Primary osteoarthritis of right knee 06/25/2017  . Depression 06/25/2017  . Essential hypertension 06/25/2017  . Tobacco abuse 06/25/2017  . Prediabetes 06/25/2017  . OSA on CPAP 06/25/2017  . Bronchitis 09/24/2016  . Left-sided chest pain 09/24/2016  . Leukocytosis 09/24/2016  . Radiculopathy, lumbar region 07/16/2016  . Degenerative lumbar disc 07/03/2016  . Synovitis of hip 04/20/2016  . Screening for breast cancer 12/28/2014  . Type II diabetes mellitus ([place]) 04/06/2014  . Pain in joint involving lower leg 05/13/2013   Social History   Socioeconomic History  . Marital status: Single    Spouse name: Not on file  . Number of children: 2  . Years of education: 9  . Highest education level: Not on file  Occupational History  . Occupation: unemployed  Social Needs  . Financial resource strain: Not on file  . Food insecurity    Worry: Not on file    Inability: Not on file  . Transportation needs    Medical: Not on file    Non-medical: Not on file  Tobacco Use  . Smoking status: Current Some Day Smoker    Packs/day: 0.25    Years: 30.00    Pack years: 7.50    Types: Cigarettes  . Smokeless tobacco: Never Used  Substance and Sexual Activity  . Alcohol use: Yes    Comment: occasional  . Drug use: Yes    Types: Marijuana    Comment: smoke 1 joint cannibis 1 week ago.  Marland Kitchen Sexual activity: Yes    Birth control/protection: None    Comment: declined insurance questions,des neg  Lifestyle  . Physical activity    Days per week: Not on file    Minutes per session: Not on file  . Stress: Not on file  Relationships  . Social Herbalist on phone: Not on file    Gets together: Not on file    Attends religious service: Not on file    Active member of club or organization: Not on file    Attends meetings of clubs or organizations: Not on file    Relationship status: Not on file  . Intimate partner violence    Fear of current or ex partner:  Not on file    Emotionally abused: Not on file    Physically abused: Not on file    Forced sexual activity: Not on file  Other Topics Concern  . Not on file  Social History Narrative  . Not on file    Ms. Nakanishi's family history includes Breast cancer in her maternal aunt; Colon polyps in her brother; Diabetes in her mother; Hypertension in her father and mother; Lung cancer in her maternal aunt.      Objective:    Vitals:   07/24/19 1326  BP: 136/80  Pulse: 82  Temp: 98.6 F (37 C)  SpO2: 98%    Physical Exam Well-developed well-nourished African-American in no acute distress.  Height, Weight 276, BMI 47.4  HEENT; nontraumatic normocephalic, EOMI, PE R  LA, sclera anicteric. Oropharynx; not examined, wearing mask/COVID Neck; supple, no JVD Cardiovascular; regular rate and rhythm with S1-S2, no murmur rub or gallop Pulmonary; Clear bilaterally Abdomen; soft, obese, nontender, nondistended, no palpable mass or hepatosplenomegaly, bowel sounds are active Rectal; not done today Skin; benign exam, no jaundice rash or appreciable lesions Extremities; no clubbing cyanosis or edema skin warm and dry Neuro/Psych; alert and oriented x4, grossly nonfocal mood and affect appropriate       Assessment & Plan:   #54 53 year old African-American female referred for new finding of iron deficiency.  Patient is not anemic and most recent hemoglobin 14.6. She is asymptomatic and has no GI symptoms.  She does have history of chronic GERD well-controlled on omeprazole.  Etiology of iron deficiency is not clear, will need to rule out occult sources for low-grade GI blood loss. Iron deficiency may be secondary to menorrhagia, which is being monitored.  #2 B12 deficiency-new finding, starting injection therapy #3 hypertension 4.  Adult onset diabetes mellitus 5.  Obstructive sleep apnea 6.  Morbid obesity-BMI 47  Plan; patient will be scheduled for colonoscopy and EGD with Dr. Hilarie Fredrickson.   Both procedures were discussed in detail with the patient including indications risks and benefits and she is agreeable to proceed. Continue Nexium 40 mg p.o. every morning Start Nu-Iron 1 p.o. daily.  Patient will need follow-up CBC and iron studies in 3 months. If EGD and colonoscopy are unrevealing, patient will need to be referred back to GYN to consider hysterectomy or other treatment for fibroids associated with menorrhagia.  Amy Genia Harold PA-C 07/24/2019   Cc: Ladell Pier, MD

## 2019-07-24 NOTE — Patient Instructions (Addendum)
If you are age 53 or older, your body mass index should be between 23-30. Your Body mass index is 47.44 kg/m. If this is out of the aforementioned range listed, please consider follow up with your Primary Care Provider.  If you are age 33 or younger, your body mass index should be between 19-25. Your Body mass index is 47.44 kg/m. If this is out of the aformentioned range listed, please consider follow up with your Primary Care Provider.   You have been scheduled for an endoscopy and colonoscopy. Please follow the written instructions given to you at your visit today. Please pick up your prep supplies at the pharmacy within the next 1-3 days. If you use inhalers (even only as needed), please bring them with you on the day of your procedure. Your physician has requested that you go to www.startemmi.com and enter the access code given to you at your visit today. This web site gives a general overview about your procedure. However, you should still follow specific instructions given to you by our office regarding your preparation for the procedure.  We have sent the following medications to your pharmacy for you to pick up at your convenience: Suprep Nu-Iron   [place].  Thank you for choosing me and [facility].   Amy Esterwood, PA-C

## 2019-07-25 NOTE — Progress Notes (Signed)
Addendum: Reviewed and agree with assessment and management plan. Pyrtle, Jay M, MD  

## 2019-07-27 ENCOUNTER — Telehealth: Payer: Self-pay | Admitting: Hematology

## 2019-07-27 NOTE — Telephone Encounter (Signed)
[facility]gs transfer to [place]. Returned call to patient re rescheduling 8/24 appointment. Per patient moved 8/24 injection to 8/28. Patient aware subsequent appointments moved to stay in line q4ws. Also spoke with patient re December follow up Dr. Burr Medico same day as injection. Patient will get new schedule 8/28.

## 2019-07-31 ENCOUNTER — Ambulatory Visit: Payer: Medicare Other

## 2019-08-03 ENCOUNTER — Encounter: Payer: Self-pay | Admitting: Internal Medicine

## 2019-08-03 ENCOUNTER — Ambulatory Visit: Payer: Medicare Other | Attending: Internal Medicine | Admitting: Internal Medicine

## 2019-08-03 ENCOUNTER — Other Ambulatory Visit: Payer: Self-pay

## 2019-08-03 DIAGNOSIS — E785 Hyperlipidemia, unspecified: Secondary | ICD-10-CM

## 2019-08-03 DIAGNOSIS — G43011 Migraine without aura, intractable, with status migrainosus: Secondary | ICD-10-CM | POA: Diagnosis not present

## 2019-08-03 DIAGNOSIS — Z23 Encounter for immunization: Secondary | ICD-10-CM

## 2019-08-03 DIAGNOSIS — I10 Essential (primary) hypertension: Secondary | ICD-10-CM | POA: Diagnosis not present

## 2019-08-03 DIAGNOSIS — F320 Major depressive disorder, single episode, mild: Secondary | ICD-10-CM

## 2019-08-03 DIAGNOSIS — R7303 Prediabetes: Secondary | ICD-10-CM

## 2019-08-03 DIAGNOSIS — F32A Depression, unspecified: Secondary | ICD-10-CM

## 2019-08-03 DIAGNOSIS — G250 Essential tremor: Secondary | ICD-10-CM

## 2019-08-03 DIAGNOSIS — F17210 Nicotine dependence, cigarettes, uncomplicated: Secondary | ICD-10-CM

## 2019-08-03 DIAGNOSIS — F17200 Nicotine dependence, unspecified, uncomplicated: Secondary | ICD-10-CM

## 2019-08-03 DIAGNOSIS — K219 Gastro-esophageal reflux disease without esophagitis: Secondary | ICD-10-CM

## 2019-08-03 DIAGNOSIS — E611 Iron deficiency: Secondary | ICD-10-CM

## 2019-08-03 DIAGNOSIS — E538 Deficiency of other specified B group vitamins: Secondary | ICD-10-CM

## 2019-08-03 MED ORDER — PRAVASTATIN SODIUM 20 MG PO TABS: ORAL_TABLET | ORAL | 3 refills | Status: DC

## 2019-08-03 MED ORDER — PROPRANOLOL HCL 10 MG PO TABS: 10.0000 mg | ORAL_TABLET | Freq: Two times a day (BID) | ORAL | 3 refills | Status: DC

## 2019-08-03 MED ORDER — ESOMEPRAZOLE MAGNESIUM 40 MG PO CPDR: 40.0000 mg | DELAYED_RELEASE_CAPSULE | Freq: Every day | ORAL | 3 refills | Status: DC

## 2019-08-03 MED ORDER — HYDROCHLOROTHIAZIDE 25 MG PO TABS: 25.0000 mg | ORAL_TABLET | Freq: Every day | ORAL | 3 refills | Status: DC

## 2019-08-03 MED ORDER — LOSARTAN POTASSIUM 50 MG PO TABS: 50.0000 mg | ORAL_TABLET | Freq: Every day | ORAL | 3 refills | Status: DC

## 2019-08-03 NOTE — Progress Notes (Signed)
Virtual Visit via Telephone Note Due to current restrictions/limitations of in-office visits due to the COVID-19 pandemic, this scheduled clinical appointment was converted to a telehealth visit  I connected with Sarah Randall on 08/03/19 at 1:42 p.m by telephone and verified that I am speaking with the correct person using two identifiers. I am in my office.  The patient is at home.  Only the patient and myself participated in this encounter.  I discussed the limitations, risks, security and privacy concerns of performing an evaluation and management service by telephone and the availability of in person appointments. I also discussed with the patient that there may be a patient responsible charge related to this service. The patient expressed understanding and agreed to proceed.   History of Present Illness: Pt with hx of HTN,pre-DM, tob abuse, OSA has CPAP but not using, obesity,GERD,depression, IDA, B12 defobesity,DDDoflumbar spine, OA RT hip and LT knees/p jt replacements  Seen Dr. Walden Field for elev WBC.  Work up neg but found to have  IDA and B12 def.  Will get B12 shot starting tomorrow at Dr. Walden Field office.  Referred to GI Dr. Hilarie Fredrickson.  Plan to do EGD and c-scope as part of work up for anemia.   Menorrhagia with regular cycle:  Saw GYN Dr. Dellis Filbert.  Dx with fiborids.  Started on BCP.  She is on her 2nd pack.  Did not see decrease flow 1st mth on BCP  Pre-DM:  A1C last mth was 6.4. Reports eating habits have been good Not getting in formal exercise but states that she is moving more because she is having to take care of her parents  HL:  Cut back on Pravastatin from 3 x/wk to 2 x wk due to cramps  HYPERTENSION Currently taking: see medication list Med Adherence: [x]  Yes    []  No Medication side effects: []  Yes    [x]  No Adherence with salt restriction: [x]  Yes    []  No Home Monitoring?: []  Yes    [x]  No but does have a device Monitoring Frequency: []  Yes    []  No Home BP results  range: []  Yes    []  No SOB? []  Yes    [x]  No Chest Pain?: []  Yes    [x]  No Leg swelling?: []  Yes    [x]  No Headaches?: []  Yes    [x]  No Dizziness? []  Yes    [x]  No Comments:   Depression:  Takes Lexapro only when she feels overwhelm with things. Reports she is doing better  Tob dep:  Smoking 4 cig/day which is less than before.  Trying to quit.    Outpatient Encounter Medications as of 08/03/2019  Medication Sig  . cetirizine (ZYRTEC) 5 MG tablet Take 1 tablet (5 mg total) by mouth daily. In the evening as needed for congestion  . escitalopram (LEXAPRO) 10 MG tablet 1/2 tab PO daily x 1 wk then 1 tab PO ddaily (Patient taking differently: Take 10 mg by mouth daily. Pt states she takes as needed)  . esomeprazole (NEXIUM) 40 MG capsule TAKE 1 CAPSULE (40 MG TOTAL) BY MOUTH DAILY.  . hydrochlorothiazide (HYDRODIURIL) 25 MG tablet TAKE 1 TABLET BY MOUTH DAILY.  . iron polysaccharides (NU-IRON) 150 MG capsule Take 1 capsule (150 mg total) by mouth daily.  Marland Kitchen losartan (COZAAR) 50 MG tablet TAKE 1 TABLET (50 MG TOTAL) BY MOUTH DAILY.  . Na Sulfate-K Sulfate-Mg Sulf 17.5-3.13-1.6 GM/177ML SOLN Suprep-Use as directed  . nitroGLYCERIN (NITROSTAT) 0.4 MG SL tablet Place  1 tablet (0.4 mg total) under the tongue every 5 (five) minutes as needed. (Patient not taking: Reported on 06/15/2019)  . norethindrone (MICRONOR) 0.35 MG tablet Take 1 tablet (0.35 mg total) by mouth daily.  Marland Kitchen nystatin (MYCOSTATIN) 100000 UNIT/ML suspension Take 5 mLs (500,000 Units total) by mouth 2 (two) times a day.  . pravastatin (PRAVACHOL) 20 MG tablet 1 tab Q Mon/Wed and Fri (Patient taking differently: Take 20 mg by mouth every Monday, Wednesday, and Friday. 1 tab Q Mon/Wed and Fri)  . propranolol (INDERAL) 10 MG tablet TAKE 1 TABLET (10 MG TOTAL) BY MOUTH 2 (TWO) TIMES DAILY.   No facility-administered encounter medications on file as of 08/03/2019.     Observations/Objective:  Lab Results  Component Value Date   WBC  12.2 (H) 06/15/2019   HGB 14.6 06/15/2019   HCT 43.1 06/15/2019   MCV 90.9 06/15/2019   PLT 359 06/15/2019     Chemistry      Component Value Date/Time   NA 138 06/20/2019 0943   K 4.0 06/20/2019 0943   CL 100 06/20/2019 0943   CO2 21 06/20/2019 0943   BUN 12 06/20/2019 0943   CREATININE 0.92 06/20/2019 0943   CREATININE 0.94 06/15/2019 1012      Component Value Date/Time   CALCIUM 9.4 06/20/2019 0943   ALKPHOS 94 06/20/2019 0943   AST 16 06/20/2019 0943   AST 17 06/15/2019 1012   ALT 11 06/20/2019 0943   ALT 16 06/15/2019 1012   BILITOT <0.2 06/20/2019 0943   BILITOT 0.3 06/15/2019 1012     Lab Results  Component Value Date   IRON 57 06/15/2019   TIBC 378 06/15/2019   FERRITIN 41 06/15/2019    Assessment and Plan: 1. Essential hypertension Encourage patient to check blood pressure at least once a week with goal being 130/80 or lower.  Continue DASH diet - hydrochlorothiazide (HYDRODIURIL) 25 MG tablet; Take 1 tablet (25 mg total) by mouth daily.  Dispense: 90 tablet; Refill: 3 - losartan (COZAAR) 50 MG tablet; Take 1 tablet (50 mg total) by mouth daily.  Dispense: 90 tablet; Refill: 3  2. Prediabetes Discussed the importance of healthy eating habits and regular exercise.  Aim to get in at least about 150 minutes/week of moderate intensity exercise  3. Hyperlipidemia, unspecified hyperlipidemia type - pravastatin (PRAVACHOL) 20 MG tablet; 1 tab Q Mon and Fri  Dispense: 90 tablet; Refill: 3  4. Mild depression ([place]) Advised that Lexapro is meant to be taken daily.  She feels that she is doing better and does not need to be on it at this time so we will go ahead and take it off her med list  5. Tobacco dependence Commended her on cutting back.  Encouraged her to set a quit date.  Less than 5 minutes spent on counseling  6. Intractable migraine without aura and with status migrainosus Patient requested refill on propranolol - propranolol (INDERAL) 10 MG tablet;  Take 1 tablet (10 mg total) by mouth 2 (two) times daily.  Dispense: 180 tablet; Refill: 3  7. Essential tremor Patient requested refill on propranolol - propranolol (INDERAL) 10 MG tablet; Take 1 tablet (10 mg total) by mouth 2 (two) times daily.  Dispense: 180 tablet; Refill: 3  8. Iron deficiency On iron supplement  9. Vitamin B12 deficiency We will start B12 shots through the hematologist  10. Gastroesophageal reflux disease without esophagitis - esomeprazole (NEXIUM) 40 MG capsule; Take 1 capsule (40 mg total) by mouth  daily.  Dispense: 90 capsule; Refill: 3  11. Need for influenza vaccination Patient would like to get her flu shot.  I will have my CMA schedule her as a nurse only visit to come in and get it.   Follow Up Instructions: 3 mths   I discussed the assessment and treatment plan with the patient. The patient was provided an opportunity to ask questions and all were answered. The patient agreed with the plan and demonstrated an understanding of the instructions.   The patient was advised to call back or seek an in-person evaluation if the symptoms worsen or if the condition fails to improve as anticipated.  I provided 14 minutes of non-face-to-face time during this encounter.   Karle Plumber, MD

## 2019-08-04 ENCOUNTER — Other Ambulatory Visit: Payer: Self-pay

## 2019-08-04 ENCOUNTER — Inpatient Hospital Stay: Payer: Medicare Other | Attending: Internal Medicine

## 2019-08-04 VITALS — BP 115/88 | HR 91 | Temp 98.9°F | Resp 18

## 2019-08-04 DIAGNOSIS — E538 Deficiency of other specified B group vitamins: Secondary | ICD-10-CM | POA: Insufficient documentation

## 2019-08-04 MED ORDER — CYANOCOBALAMIN 1000 MCG/ML IJ SOLN
1000.0000 ug | Freq: Once | INTRAMUSCULAR | Status: AC
  Administered 2019-08-04: 13:00:00 1000 ug via INTRAMUSCULAR

## 2019-08-04 MED ORDER — CYANOCOBALAMIN 1000 MCG/ML IJ SOLN
INTRAMUSCULAR | Status: AC
  Filled 2019-08-04: qty 1

## 2019-08-04 NOTE — Patient Instructions (Signed)

## 2019-08-10 ENCOUNTER — Encounter: Payer: Medicare Other | Admitting: Internal Medicine

## 2019-08-11 ENCOUNTER — Ambulatory Visit: Payer: Medicare Other | Admitting: Pharmacist

## 2019-08-28 ENCOUNTER — Ambulatory Visit: Payer: Medicare Other

## 2019-09-01 ENCOUNTER — Inpatient Hospital Stay: Payer: Medicare Other | Attending: Hematology

## 2019-09-01 ENCOUNTER — Other Ambulatory Visit: Payer: Self-pay

## 2019-09-01 VITALS — BP 111/85 | HR 78 | Temp 98.7°F | Resp 18

## 2019-09-01 DIAGNOSIS — E538 Deficiency of other specified B group vitamins: Secondary | ICD-10-CM | POA: Insufficient documentation

## 2019-09-01 MED ORDER — CYANOCOBALAMIN 1000 MCG/ML IJ SOLN
1000.0000 ug | Freq: Once | INTRAMUSCULAR | Status: AC
  Administered 2019-09-01: 1000 ug via INTRAMUSCULAR

## 2019-09-01 NOTE — Patient Instructions (Signed)
Cyanocobalamin, Pyridoxine, and Folate What is this medicine? A multivitamin containing folic acid, vitamin B6, and vitamin B12. This medicine may be used for other purposes; ask your health care provider or pharmacist if you have questions. COMMON BRAND NAME(S): AllanFol RX, AllanTex, Av-Vite FB, B Complex with Folic Acid, ComBgen, FaBB, Folamin, Folastin, Folbalin, Folbee, Folbic, Folcaps, Folgard, Folgard RX, Folgard RX 2.2, Folplex, Folplex 2.2, Foltabs 800, Foltx, Homocysteine Formula, Niva-Fol, NuFol, TL Gard RX, Virt-Gard, Virt-Vite, Virt-Vite Forte, Vita-Respa What should I tell my health care provider before I take this medicine? They need to know if you have any of these conditions:  bleeding or clotting disorder  history of anemia of any type  other chronic health condition  an unusual or allergic reaction to vitamins, other medicines, foods, dyes, or preservatives  pregnant or trying to get pregnant  breast-feeding How should I use this medicine? Take by mouth with a glass of water. May take with food. Follow the directions on the prescription label. It is usually given once a day. Do not take your medicine more often than directed. Contact your pediatrician regarding the use of this medicine in children. Special care may be needed. Overdosage: If you think you have taken too much of this medicine contact a poison control center or emergency room at once. NOTE: This medicine is only for you. Do not share this medicine with others. What if I miss a dose? If you miss a dose, take it as soon as you can. If it is almost time for your next dose, take only that dose. Do not take double or extra doses. What may interact with this medicine?  levodopa This list may not describe all possible interactions. Give your health care provider a list of all the medicines, herbs, non-prescription drugs, or dietary supplements you use. Also tell them if you smoke, drink alcohol, or use illegal  drugs. Some items may interact with your medicine. What should I watch for while using this medicine? See your health care professional for regular checks on your progress. Remember that vitamin supplements do not replace the need for good nutrition from a balanced diet. What side effects may I notice from receiving this medicine? Side effects that you should report to your doctor or health care professional as soon as possible:  allergic reaction such as skin rash or difficulty breathing  vomiting Side effects that usually do not require medical attention (report to your doctor or health care professional if they continue or are bothersome):  nausea  stomach upset This list may not describe all possible side effects. Call your doctor for medical advice about side effects. You may report side effects to FDA at 1-800-FDA-1088. Where should I keep my medicine? Keep out of the reach of children. Most vitamins should be stored at controlled room temperature. Check your specific product directions. Protect from heat and moisture. Throw away any unused medicine after the expiration date. NOTE: This sheet is a summary. It may not cover all possible information. If you have questions about this medicine, talk to your doctor, pharmacist, or health care provider.  2020 Elsevier/Gold Standard (2008-01-14 00:59:55)  

## 2019-09-04 ENCOUNTER — Other Ambulatory Visit: Payer: Self-pay

## 2019-09-05 ENCOUNTER — Ambulatory Visit (INDEPENDENT_AMBULATORY_CARE_PROVIDER_SITE_OTHER): Payer: Medicare Other | Admitting: Obstetrics & Gynecology

## 2019-09-05 ENCOUNTER — Encounter: Payer: Self-pay | Admitting: Obstetrics & Gynecology

## 2019-09-05 VITALS — BP 128/86

## 2019-09-05 DIAGNOSIS — D219 Benign neoplasm of connective and other soft tissue, unspecified: Secondary | ICD-10-CM | POA: Diagnosis not present

## 2019-09-05 DIAGNOSIS — N920 Excessive and frequent menstruation with regular cycle: Secondary | ICD-10-CM

## 2019-09-05 MED ORDER — MEDROXYPROGESTERONE ACETATE 150 MG/ML IM SUSP: 150.0000 mg | INTRAMUSCULAR | 4 refills | Status: DC

## 2019-09-05 MED ORDER — MEDROXYPROGESTERONE ACETATE 150 MG/ML IM SUSP: 150.0000 mg | Freq: Once | INTRAMUSCULAR | Status: DC

## 2019-09-05 MED ORDER — MEDROXYPROGESTERONE ACETATE 150 MG/ML IM SUSP
150.0000 mg | Freq: Once | INTRAMUSCULAR | Status: AC
  Administered 2019-09-05: 150 mg via INTRAMUSCULAR

## 2019-09-05 NOTE — Progress Notes (Signed)
    Sarah Randall 28-Sep-1966 [id number]        53 y.o.  G2P2L2 Married.  S/P TL.  RP: Menorrhagia refractory to the Progestin-only pill  HPI: Continued menorrhagia even on the Progestin-only pill.  Dysmenorrhea.     OB History  No obstetric history on file.    Past medical history,surgical history, problem list, medications, allergies, family history and social history were all reviewed and documented in the EPIC chart.   Directed ROS with pertinent positives and negatives documented in the history of present illness/assessment and plan.  Exam:  Vitals:   09/05/19 1205  BP: 128/86   General appearance:  Normal  Pelvic US 06/07/2019: T/V images. Anteverted uterus measuring 11.71 x 5.57 x 5.5 cm. Intramural fibroids with the largest measured at 2.7 x 1.8 cm and 4 others measuring less than 1.5 cm. Thin symmetrical endometrial lining measured at 5.0 mm. No endometrial mass seen. Both ovaries normal in size with sparse small follicles. No adnexal mass seen. No free fluid in the posterior cutis sac.   Assessment/Plan:  53 y.o. No obstetric history on file.   1. Menorrhagia with regular cycle Persistent Menorrhagia on the Progestin-only BCPs x 06/07/2019.  Pelvic US showed IM Fibroids, endometrial lining with thin.  Cigarette smoker.  Hormonal treatment with Progestin reviewed including the Mirena IUD and DepoProvera injections.  Usage/risks and benefits reviewed.  Endometrial ablation and Hysterectomy also discussed.  Will try with DepoProvera at this point.  Injection given today and prescription sent to pharmacy.  2. Fibroids As above.  Other orders - Cyanocobalamin (B-12) 1000 MCG/ML KIT; Inject as directed. - medroxyPROGESTERone (DEPO-PROVERA) injection 150 mg - medroxyPROGESTERone (DEPO-PROVERA) 150 MG/ML injection; Inject 1 mL (150 mg total) into the muscle every 3 (three) months.  Counseling on above issues and coordination of care more than 50% for 25 minutes.   Princess Bruins MD, 12:29 PM 09/05/2019

## 2019-09-07 ENCOUNTER — Ambulatory Visit: Payer: Medicare Other | Attending: Internal Medicine | Admitting: Internal Medicine

## 2019-09-07 ENCOUNTER — Encounter: Payer: Self-pay | Admitting: Obstetrics & Gynecology

## 2019-09-07 ENCOUNTER — Other Ambulatory Visit: Payer: Self-pay

## 2019-09-07 ENCOUNTER — Encounter: Payer: Self-pay | Admitting: Internal Medicine

## 2019-09-07 VITALS — BP 126/62 | HR 80 | Temp 98.6°F | Resp 18 | Ht 64.0 in | Wt 276.0 lb

## 2019-09-07 DIAGNOSIS — I959 Hypotension, unspecified: Secondary | ICD-10-CM | POA: Diagnosis not present

## 2019-09-07 DIAGNOSIS — Z1211 Encounter for screening for malignant neoplasm of colon: Secondary | ICD-10-CM

## 2019-09-07 DIAGNOSIS — N921 Excessive and frequent menstruation with irregular cycle: Secondary | ICD-10-CM

## 2019-09-07 MED ORDER — HYDROCHLOROTHIAZIDE 12.5 MG PO TABS: 12.5000 mg | ORAL_TABLET | Freq: Every day | ORAL | 6 refills | Status: DC

## 2019-09-07 NOTE — Progress Notes (Signed)
Patient ID: Sarah Randall, female    DOB: 01-28-1966  MRN: [id number]  CC: Hypertension   Subjective: Sarah Randall is a 53 y.o. female who presents for UC visit Her concerns today include:  Pt with hx of HTN,HL (on statin 2 x a wk -cramps), pre-DM, tob abuse, OSA has CPAP but not using, obesity,GERD,depression, IDA, B12 def (Dr. Walden Field), obesity,DDD oflumbar spine, OA RT hip and LT knees/p jt replacements, menorrhagia/fibroids  Concern about BP.  Was feeling a little light headed last wk.  [facility] and BP was 128/86.   Pt checked BP again the next morning after feeling light headed and it was 84/50s with her wrist device.  Thought her monitor may be incorrect so she had her sister check it with her device and it was 84/56 with arm device.  She denies any changes in her eating habits and was drinking normally. -since then she checked it 2 days ago and it was 101/78.  Feeling better with a little lightheadedness intermittently.  Usually occurs when she is sitting down.  Does not occur with position changes. Hx of Menorrhagia due to fibroid.  Dr. Redmond Pulling tried her with BCP but she was still having heavy bleeding.  Just started on Depo 2 days ago  HM:  She has not reschedule c-scope as yet  Patient Active Problem List   Diagnosis Date Noted  . Iron deficiency 08/03/2019  . B12 deficiency 06/30/2019  . Chronic right shoulder pain 07/05/2018  . Trochanteric bursitis of left hip 07/05/2018  . Morbid obesity ([place]) 07/05/2018  . Body mass index 45.0-49.9, adult ([place]) 07/05/2018  . Marijuana user 05/26/2018  . Status post total right knee replacement 01/12/2018  . Status post total hip replacement, left 09/22/2017  . Hyperlipidemia 08/10/2017  . Primary osteoarthritis of left hip 06/25/2017  . Primary osteoarthritis of right knee 06/25/2017  . Mild depression ([place]) 06/25/2017  . Essential hypertension 06/25/2017  . Tobacco abuse 06/25/2017  .  Prediabetes 06/25/2017  . OSA on CPAP 06/25/2017  . Bronchitis 09/24/2016  . Leukocytosis 09/24/2016  . Radiculopathy, lumbar region 07/16/2016  . Degenerative lumbar disc 07/03/2016  . Synovitis of hip 04/20/2016  . Screening for breast cancer 12/28/2014  . Pain in joint involving lower leg 05/13/2013     Current Outpatient Medications on File Prior to Visit  Medication Sig Dispense Refill  . cetirizine (ZYRTEC) 5 MG tablet Take 1 tablet (5 mg total) by mouth daily. In the evening as needed for congestion 30 tablet 3  . Cyanocobalamin (B-12) 1000 MCG/ML KIT Inject as directed.    Marland Kitchen esomeprazole (NEXIUM) 40 MG capsule Take 1 capsule (40 mg total) by mouth daily. 90 capsule 3  . iron polysaccharides (NU-IRON) 150 MG capsule Take 1 capsule (150 mg total) by mouth daily. 30 capsule 3  . losartan (COZAAR) 50 MG tablet Take 1 tablet (50 mg total) by mouth daily. 90 tablet 3  . medroxyPROGESTERone (DEPO-PROVERA) 150 MG/ML injection Inject 1 mL (150 mg total) into the muscle every 3 (three) months. 1 mL 4  . Na Sulfate-K Sulfate-Mg Sulf 17.5-3.13-1.6 GM/177ML SOLN Suprep-Use as directed 354 mL 0  . nitroGLYCERIN (NITROSTAT) 0.4 MG SL tablet Place 1 tablet (0.4 mg total) under the tongue every 5 (five) minutes as needed. 25 tablet 11  . pravastatin (PRAVACHOL) 20 MG tablet 1 tab Q Mon and Fri 90 tablet 3  . propranolol (INDERAL) 10 MG  tablet Take 1 tablet (10 mg total) by mouth 2 (two) times daily. 180 tablet 3  . nystatin (MYCOSTATIN) 100000 UNIT/ML suspension Take 5 mLs (500,000 Units total) by mouth 2 (two) times a day. (Patient not taking: Reported on 09/07/2019) 60 mL 0   Current Facility-Administered Medications on File Prior to Visit  Medication Dose Route Frequency Provider Last Rate Last Dose  . medroxyPROGESTERone (DEPO-PROVERA) injection 150 mg  150 mg Intramuscular Once Princess Bruins, MD        Allergies  Allergen Reactions  . Lipitor [Atorvastatin] Other (See Comments)     Stabbing pains in legs  . Zoloft [Sertraline Hcl] Other (See Comments)    tremors    Social History   Socioeconomic History  . Marital status: Single    Spouse name: Not on file  . Number of children: 2  . Years of education: 9  . Highest education level: Not on file  Occupational History  . Occupation: unemployed  Social Needs  . Financial resource strain: Not on file  . Food insecurity    Worry: Not on file    Inability: Not on file  . Transportation needs    Medical: Not on file    Non-medical: Not on file  Tobacco Use  . Smoking status: Current Some Day Smoker    Packs/day: 0.25    Years: 30.00    Pack years: 7.50    Types: Cigarettes  . Smokeless tobacco: Never Used  Substance and Sexual Activity  . Alcohol use: Yes    Comment: occasional  . Drug use: Yes    Types: Marijuana    Comment: smoke 1 joint cannibis 1 week ago.  Marland Kitchen Sexual activity: Yes    Birth control/protection: None    Comment: declined insurance questions,des neg  Lifestyle  . Physical activity    Days per week: Not on file    Minutes per session: Not on file  . Stress: Not on file  Relationships  . Social Herbalist on phone: Not on file    Gets together: Not on file    Attends religious service: Not on file    Active member of club or organization: Not on file    Attends meetings of clubs or organizations: Not on file    Relationship status: Not on file  . Intimate partner violence    Fear of current or ex partner: Not on file    Emotionally abused: Not on file    Physically abused: Not on file    Forced sexual activity: Not on file  Other Topics Concern  . Not on file  Social History Narrative  . Not on file    Family History  Problem Relation Age of Onset  . Hypertension Mother   . Diabetes Mother   . Hypertension Father   . Breast cancer Maternal Aunt   . Lung cancer Maternal Aunt   . Colon polyps Brother     Past Surgical History:  Procedure Laterality Date   . LEFT HEART CATH AND CORONARY ANGIOGRAPHY N/A 10/25/2018   Procedure: LEFT HEART CATH AND CORONARY ANGIOGRAPHY;  Surgeon: Martinique, Peter M, MD;  Location: [facility];  Service: Cardiovascular;  Laterality: N/A;  . TOTAL HIP ARTHROPLASTY Left 09/22/2017   Procedure: LEFT TOTAL HIP ARTHROPLASTY ANTERIOR APPROACH;  Surgeon: Leandrew Koyanagi, MD;  Location: [facility];  Service: Orthopedics;  Laterality: Left;  . TOTAL KNEE ARTHROPLASTY Right 01/12/2018   Procedure: RIGHT TOTAL KNEE  ARTHROPLASTY;  Surgeon: Leandrew Koyanagi, MD;  Location: [facility];  Service: Orthopedics;  Laterality: Right;  . TUBAL LIGATION      ROS: Review of Systems Negative except as stated above  PHYSICAL EXAM: BP 126/62 (BP Location: Left Arm, Patient Position: Sitting, Cuff Size: Large)   Pulse 80   Temp 98.6 F (37 C) (Oral)   Resp 18   Ht '5\' 4"'$  (1.626 m)   Wt 276 lb (125.2 kg)   LMP 08/14/2019   SpO2 99%   BMI 47.38 kg/m   Physical Exam  General appearance - alert, well appearing, and in no distress Mental status - normal mood, behavior, speech, dress, motor activity, and thought processes Eyes - pupils equal and reactive, extraocular eye movements intact Mouth - mucous membranes moist, pharynx normal without lesions Chest - clear to auscultation, no wheezes, rales or rhonchi, symmetric air entry Heart - normal rate, regular rhythm, normal S1, S2, no murmurs, rubs, clicks or gallops Extremities - peripheral pulses normal, no pedal edema, no clubbing or cyanosis   CMP Latest Ref Rng & Units 06/20/2019 06/15/2019 05/30/2019  Glucose 65 - 99 mg/dL 105(H) 109(H) 111(H)  BUN 6 - 24 mg/dL '12 11 11  '$ Creatinine 0.57 - 1.00 mg/dL 0.92 0.94 1.04(H)  Sodium 134 - 144 mmol/L 138 137 134(L)  Potassium 3.5 - 5.2 mmol/L 4.0 3.6 3.9  Chloride 96 - 106 mmol/L 100 104 98  CO2 20 - 29 mmol/L '21 24 26  '$ Calcium 8.7 - 10.2 mg/dL 9.4 8.6(L) 8.8(L)  Total Protein 6.0 - 8.5 g/dL 7.3 7.5 -  Total Bilirubin 0.0 - 1.2 mg/dL <0.2 0.3 -   Alkaline Phos 39 - 117 IU/L 94 84 -  AST 0 - 40 IU/L 16 17 -  ALT 0 - 32 IU/L 11 16 -   Lipid Panel     Component Value Date/Time   CHOL 280 (H) 06/20/2019 0943   TRIG 181 (H) 06/20/2019 0943   HDL 42 06/20/2019 0943   CHOLHDL 6.7 (H) 06/20/2019 0943   LDLCALC 202 (H) 06/20/2019 0943    CBC    Component Value Date/Time   WBC 12.2 (H) 06/15/2019 1012   WBC 12.3 (H) 05/30/2019 1130   RBC 4.74 06/15/2019 1012   HGB 14.6 06/15/2019 1012   HGB 14.8 11/14/2018 1546   HCT 43.1 06/15/2019 1012   HCT 42.7 11/14/2018 1546   PLT 359 06/15/2019 1012   PLT 420 11/14/2018 1546   MCV 90.9 06/15/2019 1012   MCV 91 11/14/2018 1546   MCH 30.8 06/15/2019 1012   MCHC 33.9 06/15/2019 1012   RDW 13.2 06/15/2019 1012   RDW 12.7 11/14/2018 1546   LYMPHSABS 3.7 06/15/2019 1012   LYMPHSABS 5.5 (H) 11/14/2018 1546   MONOABS 0.6 06/15/2019 1012   EOSABS 0.3 06/15/2019 1012   EOSABS 0.3 11/14/2018 1546   BASOSABS 0.1 06/15/2019 1012   BASOSABS 0.1 11/14/2018 1546    ASSESSMENT AND PLAN: 1. Symptomatic hypotension Blood pressure today looks okay.  We will decrease HCTZ from 25 mg daily to 12.5 mg daily.  I would like to check hemoglobin and hematocrit to make sure she has not become more anemic. Continue to monitor blood pressure with goal being 130/80 or lower. 2. Colon cancer screening - Ambulatory referral to Gastroenterology  3. Menorrhagia with irregular cycle - Hemoglobin and Hematocrit, Blood   Patient was given the opportunity to ask questions.  Patient verbalized understanding of the plan and was able to repeat  key elements of the plan.   Orders Placed This Encounter  Procedures  . Hemoglobin and Hematocrit, Blood  . Ambulatory referral to Gastroenterology     Requested Prescriptions   Signed Prescriptions Disp Refills  . hydrochlorothiazide (HYDRODIURIL) 12.5 MG tablet 30 tablet 6    Sig: Take 1 tablet (12.5 mg total) by mouth daily.    No follow-ups on file.   Karle Plumber, MD, FACP

## 2019-09-07 NOTE — Patient Instructions (Signed)
Decrease hydrochlorothiazide to 12.5 mg daily.  Continue to monitor blood pressure at least twice a week with goal being 130/80 or lower.  I have sent a message to Dr. Hilarie Fredrickson office so that your colonoscopy can be scheduled.

## 2019-09-07 NOTE — Patient Instructions (Signed)
1. Menorrhagia with regular cycle Persistent Menorrhagia on the Progestin-only BCPs x 06/07/2019.  Pelvic US showed IM Fibroids, endometrial lining with thin.  Cigarette smoker.  Hormonal treatment with Progestin reviewed including the Mirena IUD and DepoProvera injections.  Usage/risks and benefits reviewed.  Endometrial ablation and Hysterectomy also discussed.  Will try with DepoProvera at this point.  Injection given today and prescription sent to pharmacy.  2. Fibroids As above.  Other orders - Cyanocobalamin (B-12) 1000 MCG/ML KIT; Inject as directed. - medroxyPROGESTERone (DEPO-PROVERA) injection 150 mg - medroxyPROGESTERone (DEPO-PROVERA) 150 MG/ML injection; Inject 1 mL (150 mg total) into the muscle every 3 (three) months.  Sarah Randall, it was a pleasure seeing you today!

## 2019-09-08 LAB — HEMOGLOBIN AND HEMATOCRIT, BLOOD
Hematocrit: 44.2 % (ref 34.0–46.6)
Hemoglobin: 14.9 g/dL (ref 11.1–15.9)

## 2019-09-11 ENCOUNTER — Encounter: Payer: Self-pay | Admitting: Internal Medicine

## 2019-09-29 ENCOUNTER — Telehealth: Payer: Self-pay | Admitting: Emergency Medicine

## 2019-09-29 ENCOUNTER — Inpatient Hospital Stay: Payer: Medicare Other | Attending: Hematology

## 2019-09-29 NOTE — Telephone Encounter (Signed)
Called pt regarding appt for today, no answer.  VM full.  Message sent to MD Feng's desk RN about missed appt.

## 2019-10-02 ENCOUNTER — Ambulatory Visit: Payer: Medicare Other

## 2019-10-05 ENCOUNTER — Encounter: Payer: Self-pay | Admitting: Internal Medicine

## 2019-10-05 ENCOUNTER — Ambulatory Visit (AMBULATORY_SURGERY_CENTER): Payer: Self-pay

## 2019-10-05 ENCOUNTER — Other Ambulatory Visit: Payer: Self-pay

## 2019-10-05 VITALS — Temp 97.1°F | Ht 64.0 in | Wt 273.0 lb

## 2019-10-05 DIAGNOSIS — E611 Iron deficiency: Secondary | ICD-10-CM

## 2019-10-05 DIAGNOSIS — K219 Gastro-esophageal reflux disease without esophagitis: Secondary | ICD-10-CM

## 2019-10-05 MED ORDER — NA SULFATE-K SULFATE-MG SULF 17.5-3.13-1.6 GM/177ML PO SOLN: 1.0000 | Freq: Once | ORAL | 0 refills | Status: AC

## 2019-10-05 NOTE — Progress Notes (Signed)
Denies allergies to eggs or soy products. Denies complication of anesthesia or sedation. Denies use of weight loss medication. Denies use of O2.   Emmi instructions given for colonoscopy.  Covid screening is scheduled for Monday 10/16/19 @ 1:00 Pm. Patient already had Suprep from when she was previously scheduled for a colonoscopy.

## 2019-10-16 ENCOUNTER — Other Ambulatory Visit: Payer: Self-pay | Admitting: Internal Medicine

## 2019-10-17 LAB — SARS CORONAVIRUS 2 (TAT 6-24 HRS): SARS Coronavirus 2: NEGATIVE

## 2019-10-19 ENCOUNTER — Ambulatory Visit (AMBULATORY_SURGERY_CENTER): Payer: Medicare Other | Admitting: Internal Medicine

## 2019-10-19 ENCOUNTER — Ambulatory Visit: Payer: Medicare Other | Admitting: Internal Medicine

## 2019-10-19 ENCOUNTER — Other Ambulatory Visit: Payer: Self-pay

## 2019-10-19 ENCOUNTER — Encounter: Payer: Self-pay | Admitting: Internal Medicine

## 2019-10-19 VITALS — BP 116/74 | HR 91 | Temp 98.6°F | Resp 18 | Ht 64.0 in | Wt 273.0 lb

## 2019-10-19 DIAGNOSIS — K635 Polyp of colon: Secondary | ICD-10-CM

## 2019-10-19 DIAGNOSIS — K3189 Other diseases of stomach and duodenum: Secondary | ICD-10-CM | POA: Diagnosis not present

## 2019-10-19 DIAGNOSIS — K219 Gastro-esophageal reflux disease without esophagitis: Secondary | ICD-10-CM

## 2019-10-19 DIAGNOSIS — E611 Iron deficiency: Secondary | ICD-10-CM

## 2019-10-19 DIAGNOSIS — D127 Benign neoplasm of rectosigmoid junction: Secondary | ICD-10-CM | POA: Diagnosis not present

## 2019-10-19 MED ORDER — SODIUM CHLORIDE 0.9 % IV SOLN: 500.0000 mL | Freq: Once | INTRAVENOUS | Status: DC

## 2019-10-19 NOTE — Progress Notes (Signed)
Pt's states no medical or surgical changes since previsit or office visit.  Covid- June Vitals- Courtney  

## 2019-10-19 NOTE — Patient Instructions (Signed)
PLease see handouts given to you on Polyps and Hemorrhoids. Thank you for letting us take care of your healthcare needs today.    YOU HAD AN ENDOSCOPIC PROCEDURE TODAY AT [facility]:   Refer to the procedure report that was given to you for any specific questions about what was found during the examination.  If the procedure report does not answer your questions, please call your gastroenterologist to clarify.  If you requested that your care partner not be given the details of your procedure findings, then the procedure report has been included in a sealed envelope for you to review at your convenience later.  YOU SHOULD EXPECT: Some feelings of bloating in the abdomen. Passage of more gas than usual.  Walking can help get rid of the air that was put into your GI tract during the procedure and reduce the bloating. If you had a lower endoscopy (such as a colonoscopy or flexible sigmoidoscopy) you may notice spotting of blood in your stool or on the toilet paper. If you underwent a bowel prep for your procedure, you may not have a normal bowel movement for a few days.  Please Note:  You might notice some irritation and congestion in your nose or some drainage.  This is from the oxygen used during your procedure.  There is no need for concern and it should clear up in a day or so.  SYMPTOMS TO REPORT IMMEDIATELY:   Following lower endoscopy (colonoscopy or flexible sigmoidoscopy):  Excessive amounts of blood in the stool  Significant tenderness or worsening of abdominal pains  Swelling of the abdomen that is new, acute  Fever of 100F or higher   Following upper endoscopy (EGD)  Vomiting of blood or coffee ground material  New chest pain or pain under the shoulder blades  Painful or persistently difficult swallowing  New shortness of breath  Fever of 100F or higher  Black, tarry-looking stools  For urgent or emergent issues, a gastroenterologist can be reached at any  hour by calling [phone].   DIET:  We do recommend a small meal at first, but then you may proceed to your regular diet.  Drink plenty of fluids but you should avoid alcoholic beverages for 24 hours.  ACTIVITY:  You should plan to take it easy for the rest of today and you should NOT DRIVE or use heavy machinery until tomorrow (because of the sedation medicines used during the test).    FOLLOW UP: Our staff will call the number listed on your records 48-72 hours following your procedure to check on you and address any questions or concerns that you may have regarding the information given to you following your procedure. If we do not reach you, we will leave a message.  We will attempt to reach you two times.  During this call, we will ask if you have developed any symptoms of COVID 19. If you develop any symptoms (ie: fever, flu-like symptoms, shortness of breath, cough etc.) before then, please call [phone].  If you test positive for Covid 19 in the 2 weeks post procedure, please call and report this information to [place].    If any biopsies were taken you will be contacted by phone or by letter within the next 1-3 weeks.  Please call us at [phone] if you have not heard about the biopsies in 3 weeks.    SIGNATURES/CONFIDENTIALITY: You and/or your care partner have signed paperwork which will be entered into your  electronic medical record.  These signatures attest to the fact that that the information above on your After Visit Summary has been reviewed and is understood.  Full responsibility of the confidentiality of this discharge information lies with you and/or your care-partner.

## 2019-10-19 NOTE — Progress Notes (Signed)
Called to room to assist during endoscopic procedure.  Patient ID and intended procedure confirmed with present staff. Received instructions for my participation in the procedure from the performing physician.  

## 2019-10-19 NOTE — Op Note (Signed)
[place] Patient Name: Sarah Randall Procedure Date: 10/19/2019 8:40 AM MRN: [id number] Endoscopist: Jerene Bears , MD Age: 53 Referring MD:  Date of Birth: 1966-08-18 Gender: Female Account #: [id number] Procedure:                Upper GI endoscopy Indications:              Iron deficiency anemia Medicines:                Monitored Anesthesia Care Procedure:                Pre-Anesthesia Assessment:                           - Prior to the procedure, a History and Physical                            was performed, and patient medications and                            allergies were reviewed. The patient's tolerance of                            previous anesthesia was also reviewed. The risks                            and benefits of the procedure and the sedation                            options and risks were discussed with the patient.                            All questions were answered, and informed consent                            was obtained. Prior Anticoagulants: The patient has                            taken no previous anticoagulant or antiplatelet                            agents. ASA Grade Assessment: III - A patient with                            severe systemic disease. After reviewing the risks                            and benefits, the patient was deemed in                            satisfactory condition to undergo the procedure.                           After obtaining informed consent, the endoscope was  passed under direct vision. Throughout the                            procedure, the patient's blood pressure, pulse, and                            oxygen saturations were monitored continuously. The                            Endoscope was introduced through the mouth, and                            advanced to the second part of duodenum. The upper                            GI endoscopy was accomplished  without difficulty.                            The patient tolerated the procedure well. Scope In: Scope Out: Findings:                 The examined esophagus was normal.                           The entire examined stomach was normal. Biopsies                            were taken with a cold forceps for histology and                            Helicobacter pylori testing.                           The examined duodenum was normal. Biopsies for                            histology were taken with a cold forceps for                            evaluation of celiac disease. Complications:            No immediate complications. Estimated Blood Loss:     Estimated blood loss was minimal. Impression:               - Normal esophagus.                           - Normal stomach. Biopsied.                           - Normal examined duodenum. Biopsied. Recommendation:           - Patient has a contact number available for                            emergencies. The signs and symptoms of potential  delayed complications were discussed with the                            patient. Return to normal activities tomorrow.                            Written discharge instructions were provided to the                            patient.                           - Resume previous diet.                           - Continue present medications.                           - Await pathology results.                           - See the other procedure note for documentation of                            additional recommendations. Jerene Bears, MD 10/19/2019 9:12:32 AM This report has been signed electronically.

## 2019-10-19 NOTE — Op Note (Signed)
[place] [facility] Patient Name: Sarah Randall Procedure Date: 10/19/2019 8:40 AM MRN: [id number] Endoscopist: Jerene Bears , MD Age: 53 Referring MD:  Date of Birth: [birth year] Gender: Female Account #: [id number] Procedure:                Colonoscopy Indications:              Iron deficiency anemia Medicines:                Monitored Anesthesia Care Procedure:                Pre-Anesthesia Assessment:                           - Prior to the procedure, a History and Physical                            was performed, and patient medications and                            allergies were reviewed. The patient's tolerance of                            previous anesthesia was also reviewed. The risks                            and benefits of the procedure and the sedation                            options and risks were discussed with the patient.                            All questions were answered, and informed consent                            was obtained. Prior Anticoagulants: The patient has                            taken no previous anticoagulant or antiplatelet                            agents. ASA Grade Assessment: III - A patient with                            severe systemic disease. After reviewing the risks                            and benefits, the patient was deemed in                            satisfactory condition to undergo the procedure.                           After obtaining informed consent, the colonoscope  was passed under direct vision. Throughout the                            procedure, the patient's blood pressure, pulse, and                            oxygen saturations were monitored continuously. The                            Colonoscope was introduced through the anus and                            advanced to the terminal ileum. The colonoscopy was                            performed without difficulty. The  patient tolerated                            the procedure well. The quality of the bowel                            preparation was excellent. The terminal ileum,                            ileocecal valve, appendiceal orifice, and rectum                            were photographed. Scope In: 8:59:10 AM Scope Out: 9:09:28 AM Scope Withdrawal Time: 0 hours 9 minutes 5 seconds  Total Procedure Duration: 0 hours 10 minutes 18 seconds  Findings:                 The digital rectal exam was normal.                           The terminal ileum appeared normal.                           Three sessile polyps were found in the                            recto-sigmoid colon. The polyps were 4 to 6 mm in                            size. These polyps were removed with a cold snare.                            Resection and retrieval were complete.                           Internal hemorrhoids were found during                            retroflexion. The hemorrhoids were small.  The exam was otherwise without abnormality. Complications:            No immediate complications. Estimated Blood Loss:     Estimated blood loss was minimal. Impression:               - The examined portion of the ileum was normal.                           - Three 4 to 6 mm polyps at the recto-sigmoid                            colon, removed with a cold snare. Resected and                            retrieved.                           - Internal hemorrhoids.                           - The examination was otherwise normal. Recommendation:           - Patient has a contact number available for                            emergencies. The signs and symptoms of potential                            delayed complications were discussed with the                            patient. Return to normal activities tomorrow.                            Written discharge instructions were provided to the                             patient.                           - Resume previous diet.                           - Continue present medications.                           - Await pathology results.                           - Repeat colonoscopy is recommended. The                            colonoscopy date will be determined after pathology                            results from today's exam become available for  review.                           - Will follow-up pathology, but if unremarkable,                            then return to GYN to discuss menorrhagia and iron                            deficiency. Supplement iron and followup blood                            counts and iron stores to ensure normalization. Jerene Bears, MD 10/19/2019 9:15:18 AM This report has been signed electronically.

## 2019-10-23 ENCOUNTER — Telehealth: Payer: Self-pay

## 2019-10-23 ENCOUNTER — Encounter: Payer: Self-pay | Admitting: Internal Medicine

## 2019-10-23 NOTE — Telephone Encounter (Signed)
  Follow up Call-  Call back number 10/19/2019  Post procedure Call Back phone  # [phone]  Permission to leave phone message Yes  Some recent data might be hidden     Patient questions:  Do you have a fever, pain , or abdominal swelling? No. Pain Score  0 *  Have you tolerated food without any problems? Yes.    Have you been able to return to your normal activities? Yes.    Do you have any questions about your discharge instructions: Diet   No. Medications  No. Follow up visit  No.  Do you have questions or concerns about your Care? No.  Actions: * If pain score is 4 or above: No action needed, pain <4.  1. Have you developed a fever since your procedure? no  2.   Have you had an respiratory symptoms (SOB or cough) since your procedure? no  3.   Have you tested positive for COVID 19 since your procedure no  4.   Have you had any family members/close contacts diagnosed with the COVID 19 since your procedure?  no   If yes to any of these questions please route to Joylene John, RN and Alphonsa Gin, Therapist, sports.

## 2019-10-27 ENCOUNTER — Other Ambulatory Visit: Payer: Self-pay

## 2019-10-27 ENCOUNTER — Inpatient Hospital Stay: Payer: Medicare Other | Attending: Hematology

## 2019-10-27 VITALS — BP 111/85 | HR 76 | Temp 98.7°F | Resp 18

## 2019-10-27 DIAGNOSIS — E538 Deficiency of other specified B group vitamins: Secondary | ICD-10-CM | POA: Diagnosis present

## 2019-10-27 MED ORDER — CYANOCOBALAMIN 1000 MCG/ML IJ SOLN
1000.0000 ug | Freq: Once | INTRAMUSCULAR | Status: AC
  Administered 2019-10-27: 1000 ug via INTRAMUSCULAR

## 2019-10-27 MED ORDER — CYANOCOBALAMIN 1000 MCG/ML IJ SOLN
INTRAMUSCULAR | Status: AC
  Filled 2019-10-27: qty 1

## 2019-10-27 NOTE — Patient Instructions (Signed)

## 2019-10-30 ENCOUNTER — Other Ambulatory Visit: Payer: Self-pay

## 2019-10-30 ENCOUNTER — Ambulatory Visit: Payer: Medicare Other

## 2019-10-31 ENCOUNTER — Encounter: Payer: Self-pay | Admitting: Obstetrics & Gynecology

## 2019-10-31 ENCOUNTER — Ambulatory Visit (INDEPENDENT_AMBULATORY_CARE_PROVIDER_SITE_OTHER): Payer: Medicare Other | Admitting: Obstetrics & Gynecology

## 2019-10-31 VITALS — BP 120/78

## 2019-10-31 DIAGNOSIS — N920 Excessive and frequent menstruation with regular cycle: Secondary | ICD-10-CM | POA: Diagnosis not present

## 2019-10-31 DIAGNOSIS — N945 Secondary dysmenorrhea: Secondary | ICD-10-CM | POA: Diagnosis not present

## 2019-10-31 DIAGNOSIS — D219 Benign neoplasm of connective and other soft tissue, unspecified: Secondary | ICD-10-CM | POA: Diagnosis not present

## 2019-10-31 DIAGNOSIS — Z6841 Body Mass Index (BMI) 40.0 and over, adult: Secondary | ICD-10-CM

## 2019-10-31 LAB — CBC
HCT: 44.5 % (ref 35.0–45.0)
Hemoglobin: 14.9 g/dL (ref 11.7–15.5)
MCH: 30.2 pg (ref 27.0–33.0)
MCHC: 33.5 g/dL (ref 32.0–36.0)
MCV: 90.3 fL (ref 80.0–100.0)
MPV: 9.8 fL (ref 7.5–12.5)
Platelets: 427 10*3/uL — ABNORMAL HIGH (ref 140–400)
RBC: 4.93 10*6/uL (ref 3.80–5.10)
RDW: 13.3 % (ref 11.0–15.0)
WBC: 15.2 10*3/uL — ABNORMAL HIGH (ref 3.8–10.8)

## 2019-10-31 NOTE — Progress Notes (Signed)
    [place] 21-Dec-1965 [id number]        53 y.o.  G2P0012   RP: Persitent menometrorrhagia on DepoProvera  HPI: Uterine fibroids with heavy menses.  The menorrhagia was not controlled on the progestin only pill.  We therefore change the hormonal treatment to Depo-Provera injection at the last visit.  Continued heavy vaginal bleeding which is now irregular and very frequent.  Secondary dysmenorrhea is worsening.  No vaginal discharge.  No fever.  Urine and bowel movements normal.   OB History  Gravida Para Term Preterm AB Living  2 0 0 0 1 2  SAB TAB Ectopic Multiple Live Births  1 0 0 0 0    # Outcome Date GA Lbr Len/2nd Weight Sex Delivery Anes PTL Lv  2 Gravida           1 SAB             Past medical history,surgical history, problem list, medications, allergies, family history and social history were all reviewed and documented in the EPIC chart.   Directed ROS with pertinent positives and negatives documented in the history of present illness/assessment and plan.  Exam:  Vitals:   10/31/19 1433  BP: 120/78   General appearance:  Normal  Abdomen: Normal  Gynecologic exam: Vulva normal.  Bimanual exam:  Uterus AV, mildly increased in size and nodular, mobile, NT.  No adnexal mass, NT  Pelvic US 06/07/2019: T/V images. Anteverted uterus measuring 11.71 x 5.57 x 5.5 cm. Intramural fibroids with the largest measured at 2.7 x 1.8 cm and 4 others measuring less than 1.5 cm. Thin symmetrical endometrial lining measured at 5.0 mm. No endometrial mass seen. Both ovaries normal in size with sparse small follicles. No adnexal mass seen. No free fluid in the posterior cutis sac.   Assessment/Plan:  53 y.o. G2P0012   1. Fibroids Very symptomatic uterine fibroids with menometrorrhagia not controlled on the progestin pill or Depo-Provera injections.  Persistent menometrorrhagia with secondary dysmenorrhea.  Patient declines further attempts to control with hormonal  therapy. Decision to proceed with a XI robotic total laparoscopic hysterectomy with bilateral salpingectomy.  Will restart on the progestin pill until surgery in an attempt to decrease the amount of bleeding.  Robotic hysterectomy pamphlet given to patient.  Information about surgery reviewed.  Patient will follow up with a preop visit to further reviewed the procedure, preop management, postop precautions, surgical risks and benefits.  Patient voiced understanding and agreement with plan.  2. Menorrhagia with regular cycle Menorrhagia which now became menometrorrhagia on the Depo-Provera injection.  We will repeat a CBC today. - CBC  3. Secondary dysmenorrhea As above.  4. Class 3 severe obesity due to excess calories without serious comorbidity with body mass index (BMI) of 45.0 to 49.9 in adult [facility]) Obesity problem made worse on Depo-Provera injections due to increased appetite.  Patient declines further Depo-Provera injections.  Counseling on above issues and coordination of care more than 50% for 53 minutes.  Princess Bruins MD, 2:51 PM 10/31/2019

## 2019-10-31 NOTE — Patient Instructions (Signed)
1. Fibroids Very symptomatic uterine fibroids with menometrorrhagia not controlled on the progestin pill or Depo-Provera injections.  Persistent menometrorrhagia with secondary dysmenorrhea.  Patient declines further attempts to control with hormonal therapy. Decision to proceed with a XI robotic total laparoscopic hysterectomy with bilateral salpingectomy.  Will restart on the progestin pill until surgery in an attempt to decrease the amount of bleeding.  Robotic hysterectomy pamphlet given to patient.  Information about surgery reviewed.  Patient will follow up with a preop visit to further reviewed the procedure, preop management, postop precautions, surgical risks and benefits.  Patient voiced understanding and agreement with plan.  2. Menorrhagia with regular cycle Menorrhagia which now became menometrorrhagia on the Depo-Provera injection.  We will repeat a CBC today. - CBC  3. Secondary dysmenorrhea As above.  4. Class 3 severe obesity due to excess calories without serious comorbidity with body mass index (BMI) of 45.0 to 49.9 in adult [facility]) Obesity problem made worse on Depo-Provera injections due to increased appetite.  Patient declines further Depo-Provera injections.  Sarah Randall, it was a pleasure seeing you today!  I will inform you of your results as soon as they are available.

## 2019-11-01 ENCOUNTER — Telehealth: Payer: Self-pay

## 2019-11-01 ENCOUNTER — Telehealth: Payer: Self-pay | Admitting: *Deleted

## 2019-11-01 MED ORDER — TRAMADOL HCL 50 MG PO TABS: 50.0000 mg | ORAL_TABLET | Freq: Four times a day (QID) | ORAL | 0 refills | Status: DC | PRN

## 2019-11-01 NOTE — Telephone Encounter (Signed)
I called patient and let her know that I received her order for surgery and that I am waiting on the January schedule. I will call her when it is ready and discuss.

## 2019-11-01 NOTE — Telephone Encounter (Signed)
Can send Tramadol 50 mg PO every 6 hours as needed.  #20, no refill.  Recommend Aleeve and Tylenol as 1st line and Tramadol just when absolutely needed.

## 2019-11-01 NOTE — Telephone Encounter (Signed)
Patient informed, Rx called in.  

## 2019-11-01 NOTE — Telephone Encounter (Signed)
Patient was seen yesterday asked if Rx could be sent to pharmacy to help with lower back pain related to fibroids. Patient is using OTC aleve and tylenol and it is not helping. Please advise

## 2019-11-09 ENCOUNTER — Telehealth: Payer: Self-pay

## 2019-11-09 NOTE — Telephone Encounter (Addendum)
I called patient to schedule surgery in January. She wanted me  To let you know that she is still bleeding.  She started back on progestin only pill that she had a pack of. She said she discussed this with you at visit. THis has not helped her bleeding. Somedays lighter and someday clots.  Pain off and on. Sometimes Tylenol helps and other times Tramadol needed.  (Robotic [place] scheduled 12/27/19.)

## 2019-11-09 NOTE — Telephone Encounter (Signed)
I called and informed patient that January schedule is available now. I scheduled her for 12/27/19 at 8:30am at [facility].  I scheduled her for Covid screen test accordingly and advised her regarding quarantine instructions.  I will mail her a packet.  Of note, Her insurance pays 100%. No surgery prepymt due.

## 2019-11-13 ENCOUNTER — Other Ambulatory Visit: Payer: Self-pay

## 2019-11-13 MED ORDER — MEGESTROL ACETATE 40 MG PO TABS: ORAL_TABLET | ORAL | 0 refills | Status: DC

## 2019-11-13 NOTE — Telephone Encounter (Signed)
Recommend to stop the Progestin pill and replace by Megace 40 mg BID until surgery.  #60, refill x 1.

## 2019-11-13 NOTE — Telephone Encounter (Signed)
Spoke with patient and informed her. I sent #90 as there is just 45 days until her surgery and she would not need a refill or have a lot leftover.

## 2019-11-17 ENCOUNTER — Encounter: Payer: Self-pay | Admitting: Internal Medicine

## 2019-11-17 ENCOUNTER — Other Ambulatory Visit: Payer: Self-pay

## 2019-11-17 ENCOUNTER — Ambulatory Visit: Payer: Medicare Other | Attending: Internal Medicine | Admitting: Internal Medicine

## 2019-11-17 DIAGNOSIS — N938 Other specified abnormal uterine and vaginal bleeding: Secondary | ICD-10-CM

## 2019-11-17 DIAGNOSIS — I10 Essential (primary) hypertension: Secondary | ICD-10-CM | POA: Diagnosis not present

## 2019-11-17 DIAGNOSIS — F17200 Nicotine dependence, unspecified, uncomplicated: Secondary | ICD-10-CM

## 2019-11-17 DIAGNOSIS — L0291 Cutaneous abscess, unspecified: Secondary | ICD-10-CM

## 2019-11-17 DIAGNOSIS — N939 Abnormal uterine and vaginal bleeding, unspecified: Secondary | ICD-10-CM

## 2019-11-17 DIAGNOSIS — L02213 Cutaneous abscess of chest wall: Secondary | ICD-10-CM

## 2019-11-17 MED ORDER — SULFAMETHOXAZOLE-TRIMETHOPRIM 400-80 MG PO TABS: 1.0000 | ORAL_TABLET | Freq: Two times a day (BID) | ORAL | 0 refills | Status: DC

## 2019-11-17 NOTE — Progress Notes (Signed)
Virtual Visit via Telephone Note Due to current restrictions/limitations of in-office visits due to the COVID-19 pandemic, this scheduled clinical appointment was converted to a telehealth visit  I connected with Sarah Randall on 11/17/19 at 5:03 p.m by telephone and verified that I am speaking with the correct person using two identifiers. I am in my office.  The patient is at home.  Only the patient and myself participated in this encounter.  I discussed the limitations, risks, security and privacy concerns of performing an evaluation and management service by telephone and the availability of in person appointments. I also discussed with the patient that there may be a patient responsible charge related to this service. The patient expressed understanding and agreed to proceed.   History of Present Illness: Pt with hx of HTN,HL (on statin 2 x a wk -cramps), pre-DM, tob abuse, OSA has CPAP but not using, obesity,GERD,depression,IDA, B12 def (Dr. Walden Field), obesity,DDD oflumbar spine, OA RT hip and LT knees/p jt replacements, menorrhagia/fibroids  Pt c/o intermittent small to medium  boils under the breasts x 6 wks. Has a few now. No drainage No fever.  HTN: HCTZ dose decreased on last visit.  No further dizziness.  Checks BP once a wk.  Reports less than 130/80. No CP/SOB/LE edema/HA  IDA:  Had EDG and c-scope.  3 polyps removed that were hyperplastic.  EGD was normal with negative H. pylori  Tob Dep: at 5 cig/day. Trying to quit.  Not wanting to try anything to help her quit  Menorrhagia/fibroids: given Depo shot but still has bleeding.  Plan for hysterectomy in 12/2018   Current Outpatient Medications on File Prior to Visit  Medication Sig Dispense Refill  . Cyanocobalamin (B-12) 1000 MCG/ML KIT Inject as directed.    Marland Kitchen esomeprazole (NEXIUM) 40 MG capsule Take 1 capsule (40 mg total) by mouth daily. 90 capsule 3  . hydrochlorothiazide (HYDRODIURIL) 12.5 MG tablet Take 1 tablet  (12.5 mg total) by mouth daily. 30 tablet 6  . iron polysaccharides (NU-IRON) 150 MG capsule Take 1 capsule (150 mg total) by mouth daily. 30 capsule 3  . losartan (COZAAR) 50 MG tablet Take 1 tablet (50 mg total) by mouth daily. 90 tablet 3  . medroxyPROGESTERone (DEPO-PROVERA) 150 MG/ML injection Inject 1 mL (150 mg total) into the muscle every 3 (three) months. 1 mL 4  . megestrol (MEGACE) 40 MG tablet Take one tab po bid daily until surgery. 90 tablet 0  . nitroGLYCERIN (NITROSTAT) 0.4 MG SL tablet Place 1 tablet (0.4 mg total) under the tongue every 5 (five) minutes as needed. 25 tablet 11  . pravastatin (PRAVACHOL) 20 MG tablet 1 tab Q Mon and Fri 90 tablet 3  . propranolol (INDERAL) 10 MG tablet Take 1 tablet (10 mg total) by mouth 2 (two) times daily. 180 tablet 3  . traMADol (ULTRAM) 50 MG tablet Take 1 tablet (50 mg total) by mouth every 6 (six) hours as needed. 20 tablet 0   Current Facility-Administered Medications on File Prior to Visit  Medication Dose Route Frequency Provider Last Rate Last Admin  . medroxyPROGESTERone (DEPO-PROVERA) injection 150 mg  150 mg Intramuscular Once Princess Bruins, MD        Observations/Objective: No direct observation done as this was a telephone encounter  Assessment and Plan: 1. Abscess -Likely due to MRSA.  Advised good handwashing.  Recommend Bactrim antibiotics for 7 days.  If these get worse instead of better with the antibiotics, patient told to be seen  in the emergency room for incision and drainage - sulfamethoxazole-trimethoprim (BACTRIM) 400-80 MG tablet; Take 1 tablet by mouth 2 (two) times daily.  Dispense: 14 tablet; Refill: 0  2. Essential hypertension Reported blood pressure readings are good.  She will continue current medications which include hydrochlorothiazide, Cozaar and propranolol  3. Tobacco dependence Advised to quit.  She is working on cutting back.  Not ready to try any medications to help her quit.  Encouraged to  set a quit date.  4. Abnormal uterine bleeding (AUB) Being followed by gynecology.  Plan for hysterectomy   Follow Up Instructions: 3 mths   I discussed the assessment and treatment plan with the patient. The patient was provided an opportunity to ask questions and all were answered. The patient agreed with the plan and demonstrated an understanding of the instructions.   The patient was advised to call back or seek an in-person evaluation if the symptoms worsen or if the condition fails to improve as anticipated.  I provided 9 minutes of non-face-to-face time during this encounter.   Karle Plumber, MD

## 2019-11-23 NOTE — Progress Notes (Signed)
[facility]   [phone]) [phone] [phone]   Clinic Follow up Note   Patient Care Team: Ladell Pier, MD as PCP - General (Internal Medicine)  Date of Service:  11/24/2019  CHIEF COMPLAINT: F/u of leukocytosis   CURRENT THERAPY:  Vitamin B12 injection monthly starting 08/04/19   INTERVAL HISTORY:  Sarah Randall is here for a follow up. She was previously under the care of Dr. Walden Field who she last saw in 06/2019. She notes she has adequate energy and eating. She notes mild back pain from arthritis. She denies any other bleeding except vaginal bleeding. I reviewed her medical history. Her mother had mother in her 42s. Her Maternal aunt had breast cancer. She gets mammograms yearly. She notes she still smokes now 5-6 cigarettes a day.   She notes she has had heavy periods for a while from fibroids. She notes she had vaginal bleeding for 5 weeks in November. She is due to have hysterectomy in 12/2019. She notes she feels very cold. She has been getting B12 injection here and is not sure she can do it at home. She notes since starting B12 her energy has mildly improved. She notes the only tingling she has may be from how she sleeps. I reviewed her medication list. She tried Megace for her Vaginal bleeding. She notes she is still on bactrim and almost done. She takes tramadol for her back pain.    REVIEW OF SYSTEMS:   Constitutional: Denies fevers, chills or abnormal weight loss (+) mildly increased energy Eyes: Denies blurriness of vision Ears, nose, mouth, throat, and face: Denies mucositis or sore throat Respiratory: Denies cough, dyspnea or wheezes Cardiovascular: Denies palpitation, chest discomfort or lower extremity swelling Gastrointestinal:  Denies nausea, heartburn or change in bowel habits Skin: Denies abnormal skin rashes MSK: (+) lower back pain from arthritis Lymphatics: Denies new lymphadenopathy or easy bruising Neurological:Denies numbness,  tingling or new weaknesses Behavioral/Psych: Mood is stable, no new changes  All other systems were reviewed with the patient and are negative.  MEDICAL HISTORY:  Past Medical History:  Diagnosis Date  . Anemia   . Anxiety   . Arthritis   . B12 deficiency 06/30/2019  . Depression   . GERD (gastroesophageal reflux disease)    occasionally takes OTC  . Hyperlipidemia   . Hypertension   . Pre-diabetes   . Sleep apnea    tested 2014 - unable to tolerate machine    SURGICAL HISTORY: Past Surgical History:  Procedure Laterality Date  . LEFT HEART CATH AND CORONARY ANGIOGRAPHY N/A 10/25/2018   Procedure: LEFT HEART CATH AND CORONARY ANGIOGRAPHY;  Surgeon: Martinique, Peter M, MD;  Location: [facility];  Service: Cardiovascular;  Laterality: N/A;  . TOTAL HIP ARTHROPLASTY Left 09/22/2017   Procedure: LEFT TOTAL HIP ARTHROPLASTY ANTERIOR APPROACH;  Surgeon: Leandrew Koyanagi, MD;  Location: [facility];  Service: Orthopedics;  Laterality: Left;  . TOTAL KNEE ARTHROPLASTY Right 01/12/2018   Procedure: RIGHT TOTAL KNEE ARTHROPLASTY;  Surgeon: Leandrew Koyanagi, MD;  Location: [facility];  Service: Orthopedics;  Laterality: Right;  . TUBAL LIGATION      I have reviewed the social history and family history with the patient and they are unchanged from previous note.  ALLERGIES:  is allergic to lipitor [atorvastatin] and zoloft [sertraline hcl].  MEDICATIONS:  Current Outpatient Medications  Medication Sig Dispense Refill  . Cyanocobalamin (B-12) 1000 MCG/ML KIT Inject as directed.    Marland Kitchen esomeprazole (NEXIUM) 40  MG capsule Take 1 capsule (40 mg total) by mouth daily. 90 capsule 3  . hydrochlorothiazide (HYDRODIURIL) 12.5 MG tablet Take 1 tablet (12.5 mg total) by mouth daily. 30 tablet 6  . iron polysaccharides (NU-IRON) 150 MG capsule Take 1 capsule (150 mg total) by mouth daily. 30 capsule 3  . losartan (COZAAR) 50 MG tablet Take 1 tablet (50 mg total) by mouth daily. 90 tablet 3  . megestrol (MEGACE)  40 MG tablet Take one tab po bid daily until surgery. 90 tablet 0  . pravastatin (PRAVACHOL) 20 MG tablet 1 tab Q Mon and Fri 90 tablet 3  . propranolol (INDERAL) 10 MG tablet Take 1 tablet (10 mg total) by mouth 2 (two) times daily. 180 tablet 3  . sulfamethoxazole-trimethoprim (BACTRIM) 400-80 MG tablet Take 1 tablet by mouth 2 (two) times daily. 14 tablet 0  . traMADol (ULTRAM) 50 MG tablet Take 1 tablet (50 mg total) by mouth every 6 (six) hours as needed. 20 tablet 0  . nitroGLYCERIN (NITROSTAT) 0.4 MG SL tablet Place 1 tablet (0.4 mg total) under the tongue every 5 (five) minutes as needed. 25 tablet 11   No current facility-administered medications for this visit.    PHYSICAL EXAMINATION: ECOG PERFORMANCE STATUS: 1 - Symptomatic but completely ambulatory  Vitals:   11/24/19 1358  BP: 120/87  Pulse: 77  Resp: 18  Temp: 98.3 F (36.8 C)  SpO2: 100%   Filed Weights   11/24/19 1358  Weight: 270 lb 4.8 oz (122.6 kg)    GENERAL:alert, no distress and comfortable SKIN: skin color, texture, turgor are normal, no rashes or significant lesions EYES: normal, Conjunctiva are pink and non-injected, sclera clear  NECK: supple, thyroid normal size, non-tender, without nodularity LYMPH:  no palpable lymphadenopathy in the cervical, axillary  LUNGS: clear to auscultation and percussion with normal breathing effort HEART: regular rate & rhythm and no murmurs and no lower extremity edema ABDOMEN:abdomen soft, non-tender and normal bowel sounds Musculoskeletal:no cyanosis of digits and no clubbing  NEURO: alert & oriented x 3 with fluent speech, no focal motor/sensory deficits  LABORATORY DATA:  I have reviewed the data as listed CBC Latest Ref Rng & Units 11/24/2019 10/31/2019 09/07/2019  WBC 4.0 - 10.5 K/uL 14.6(H) 15.2(H) -  Hemoglobin 12.0 - 15.0 g/dL 14.9 14.9 14.9  Hematocrit 36.0 - 46.0 % 43.3 44.5 44.2  Platelets 150 - 400 K/uL 330 427(H) -     CMP Latest Ref Rng & Units  06/20/2019 06/15/2019 05/30/2019  Glucose 65 - 99 mg/dL 105(H) 109(H) 111(H)  BUN 6 - 24 mg/dL _0 Creatinine 0.57 - 1.00 mg/dL 0.92 0.94 1.04(H)  Sodium 134 - 144 mmol/L 138 137 134(L)  Potassium 3.5 - 5.2 mmol/L 4.0 3.6 3.9  Chloride 96 - 106 mmol/L 100 104 98  CO2 20 - 29 mmol/L _1 Calcium 8.7 - 10.2 mg/dL 9.4 8.6(L) 8.8(L)  Total Protein 6.0 - 8.5 g/dL 7.3 7.5 -  Total Bilirubin 0.0 - 1.2 mg/dL <0.2 0.3 -  Alkaline Phos 39 - 117 IU/L 94 84 -  AST 0 - 40 IU/L 16 17 -  ALT 0 - 32 IU/L 11 16 -      RADIOGRAPHIC STUDIES: I have personally reviewed the radiological images as listed and agreed with the findings in the report. No results found.   ASSESSMENT & PLAN:  Sarah Randall is a 53 y.o. female with   1. Mild leukocytosis likely reactive  -  I have reviewed her previously lab work in detail with pt. -Labs done 05/30/2019 showed WBC 12.3 HB 14.9 plts 363,000. Labs done 06/15/2019 reviewed and showed WBC 12.2 HB 14.6 HCT 43 platelet count 359,000. WBC differentiation showed increased neutrophils and lymphocytes.  BCR/ABL negative. -This is likely reactive, related to smoking.  -I reviewed smoking cessation and she is willing to try.    2. Menorrhagia Pt has been seen by GYN and diagnosed with fibroids.  HB WNL at 14.6 with ferritin of 41.  She did have 4-5 week long vaginal bleeding. She plans to have hysterectomy in 12/2019 with her Gynecologist.  -She has no anemia. Will monitor.    3. B12 deficiency -B12 level decreased at 165. She has normal MMA.   -She has been on monthly B12 injection 1019mg in clinic since 08/04/19. Her energy has mildly improved.  -I have checked intrinsic factor today, to rule out pernicious anemia.  If negative, OK to switch to oral B12.    4. Smoking Cessation  -She is down to smoking 5-6 cigarettes a day. I encouraged her to continue working on cessation until she completely quits.    PLAN:  -Proceed with B12 injection today  -she will  start oral B12 10069m daily, if intrinsic factor negative, okay to stop injection -Lab and F/u in 6 months   No problem-specific Assessment & Plan notes found for this encounter.   No orders of the defined types were placed in this encounter.  All questions were answered. The patient knows to call the clinic with any problems, questions or concerns. No barriers to learning was detected. I spent 20 minutes counseling the patient face to face. The total time spent in the appointment was 25 minutes and more than 50% was on counseling and review of test results     YaTruitt MerleMD 11/24/2019   I, AmJoslyn Devonam acting as scribe for YaTruitt MerleMD.   I have reviewed the above documentation for accuracy and completeness, and I agree with the above.

## 2019-11-24 ENCOUNTER — Inpatient Hospital Stay: Payer: Medicare Other

## 2019-11-24 ENCOUNTER — Inpatient Hospital Stay: Payer: Medicare Other | Attending: Hematology

## 2019-11-24 ENCOUNTER — Inpatient Hospital Stay (HOSPITAL_BASED_OUTPATIENT_CLINIC_OR_DEPARTMENT_OTHER): Payer: Medicare Other | Admitting: Hematology

## 2019-11-24 ENCOUNTER — Other Ambulatory Visit: Payer: Self-pay | Admitting: Hematology

## 2019-11-24 ENCOUNTER — Encounter: Payer: Self-pay | Admitting: Hematology

## 2019-11-24 ENCOUNTER — Other Ambulatory Visit: Payer: Self-pay

## 2019-11-24 VITALS — BP 120/87 | HR 77 | Temp 98.3°F | Resp 18 | Ht 64.0 in | Wt 270.3 lb

## 2019-11-24 DIAGNOSIS — D72829 Elevated white blood cell count, unspecified: Secondary | ICD-10-CM

## 2019-11-24 DIAGNOSIS — E538 Deficiency of other specified B group vitamins: Secondary | ICD-10-CM

## 2019-11-24 DIAGNOSIS — D72828 Other elevated white blood cell count: Secondary | ICD-10-CM

## 2019-11-24 LAB — CBC WITH DIFFERENTIAL (CANCER CENTER ONLY)
Abs Immature Granulocytes: 0.04 10*3/uL (ref 0.00–0.07)
Basophils Absolute: 0.1 10*3/uL (ref 0.0–0.1)
Basophils Relative: 1 %
Eosinophils Absolute: 0.3 10*3/uL (ref 0.0–0.5)
Eosinophils Relative: 2 %
HCT: 43.3 % (ref 36.0–46.0)
Hemoglobin: 14.9 g/dL (ref 12.0–15.0)
Immature Granulocytes: 0 %
Lymphocytes Relative: 36 %
Lymphs Abs: 5.3 10*3/uL — ABNORMAL HIGH (ref 0.7–4.0)
MCH: 31.2 pg (ref 26.0–34.0)
MCHC: 34.4 g/dL (ref 30.0–36.0)
MCV: 90.8 fL (ref 80.0–100.0)
Monocytes Absolute: 0.7 10*3/uL (ref 0.1–1.0)
Monocytes Relative: 5 %
Neutro Abs: 8.2 10*3/uL — ABNORMAL HIGH (ref 1.7–7.7)
Neutrophils Relative %: 56 %
Platelet Count: 330 10*3/uL (ref 150–400)
RBC: 4.77 MIL/uL (ref 3.87–5.11)
RDW: 13.6 % (ref 11.5–15.5)
WBC Count: 14.6 10*3/uL — ABNORMAL HIGH (ref 4.0–10.5)
nRBC: 0 % (ref 0.0–0.2)

## 2019-11-24 LAB — VITAMIN B12: Vitamin B-12: 294 pg/mL (ref 180–914)

## 2019-11-24 MED ORDER — CYANOCOBALAMIN 1000 MCG/ML IJ SOLN
INTRAMUSCULAR | Status: AC
  Filled 2019-11-24: qty 1

## 2019-11-24 MED ORDER — CYANOCOBALAMIN 1000 MCG/ML IJ SOLN
1000.0000 ug | Freq: Once | INTRAMUSCULAR | Status: AC
  Administered 2019-11-24: 1000 ug via INTRAMUSCULAR

## 2019-11-24 NOTE — Patient Instructions (Signed)

## 2019-11-25 ENCOUNTER — Telehealth: Payer: Self-pay | Admitting: Hematology

## 2019-11-25 NOTE — Telephone Encounter (Signed)
Scheduled appt per 12/18 los.  Spoke with pt and she is aware of the appt date and time.

## 2019-11-27 ENCOUNTER — Other Ambulatory Visit: Payer: Medicare Other

## 2019-11-27 ENCOUNTER — Ambulatory Visit: Payer: Medicare Other | Admitting: Hematology

## 2019-11-27 ENCOUNTER — Ambulatory Visit: Payer: Medicare Other

## 2019-11-29 ENCOUNTER — Other Ambulatory Visit: Payer: Self-pay

## 2019-11-29 ENCOUNTER — Encounter: Payer: Self-pay | Admitting: Orthopaedic Surgery

## 2019-11-29 ENCOUNTER — Ambulatory Visit (INDEPENDENT_AMBULATORY_CARE_PROVIDER_SITE_OTHER): Payer: Medicare Other

## 2019-11-29 ENCOUNTER — Ambulatory Visit (INDEPENDENT_AMBULATORY_CARE_PROVIDER_SITE_OTHER): Payer: Medicare Other | Admitting: Orthopaedic Surgery

## 2019-11-29 DIAGNOSIS — M5416 Radiculopathy, lumbar region: Secondary | ICD-10-CM

## 2019-11-29 LAB — METHYLMALONIC ACID, SERUM: Methylmalonic Acid, Quantitative: 83 nmol/L (ref 0–378)

## 2019-11-29 MED ORDER — PREDNISONE 10 MG (21) PO TBPK: ORAL_TABLET | ORAL | 0 refills | Status: DC

## 2019-11-29 MED ORDER — TIZANIDINE HCL 2 MG PO TABS: 2.0000 mg | ORAL_TABLET | Freq: Three times a day (TID) | ORAL | 0 refills | Status: DC | PRN

## 2019-11-29 MED ORDER — HYDROCODONE-ACETAMINOPHEN 5-325 MG PO TABS: 1.0000 | ORAL_TABLET | Freq: Every day | ORAL | 0 refills | Status: DC | PRN

## 2019-11-29 NOTE — Progress Notes (Signed)
Office Visit Note   Patient: Sarah Randall           Date of Birth: May 10, 1966           MRN: [id number] Visit Date: 11/29/2019              Requested by: Ladell Pier, MD [address] [place],  [place] [address] PCP: Ladell Pier, MD   Assessment & Plan: Visit Diagnoses:  1. Radiculopathy, lumbar region     Plan: Impression is chronic lower back pain.  At this point, the patient has been dealing with this for many months and has failed conservative treatment to include physical therapy.  We will go ahead and obtain an MRI of her lumbar spine.  She will follow-up with [place] once has been completed.  In the meantime, we will refill her steroid, muscle relaxer and pain medicine.  Follow-Up Instructions: Return for after MRI.   Orders:  Orders Placed This Encounter  Procedures  . XR Lumbar Spine 2-3 Views   No orders of the defined types were placed in this encounter.     Procedures: No procedures performed   Clinical Data: No additional findings.   Subjective: Chief Complaint  Patient presents with  . Lower Back - Pain    HPI patient is a pleasant 53 year old female who comes in today with recurrent bilateral lower back pain.  We saw her for this many months ago.  Her pain returned about a month ago and has progressively worsened.  Pain she has is described as a constant ache to both sides of the lower back.  No radiculopathy down either leg.  No weakness to either leg.  No numbness tingling or burning.  Pain is aggravated with bending over as well as going from a seated or lying position to a standing position.  She has been taking ibuprofen without relief of symptoms.  In the past, she has been to physical therapy, tried steroid taper as well as muscle relaxers without significant relief of symptoms.  No previous epidural steroid injection or MRI of the lumbar spine.  Review of Systems as detailed in HPI.  All others reviewed and are  negative.   Objective: Vital Signs: There were no vitals taken for this visit.  Physical Exam well-developed well-nourished female no acute distress.  Alert oriented x3.  Ortho Exam examination of her lumbar spine reveals no spinous tenderness.  Mild tenderness throughout the paraspinous musculature both sides.  Negative straight leg raise both sides.  No focal weakness.  She is neurovascular intact distally.  Specialty Comments:  No specialty comments available.  Imaging: XR Lumbar Spine 2-3 Views  Result Date: 11/29/2019 Disc space narrowing L3-4 with retrolisthesis L4     PMFS History: Patient Active Problem List   Diagnosis Date Noted  . Iron deficiency 08/03/2019  . B12 deficiency 06/30/2019  . Chronic right shoulder pain 07/05/2018  . Trochanteric bursitis of left hip 07/05/2018  . Morbid obesity ([place]) 07/05/2018  . Body mass index 45.0-49.9, adult ([facility]) 07/05/2018  . Marijuana user 05/26/2018  . Status post total right knee replacement 01/12/2018  . Status post total hip replacement, left 09/22/2017  . Hyperlipidemia 08/10/2017  . Primary osteoarthritis of left hip 06/25/2017  . Primary osteoarthritis of right knee 06/25/2017  . Mild depression ([facility]) 06/25/2017  . Essential hypertension 06/25/2017  . Tobacco abuse 06/25/2017  . Prediabetes 06/25/2017  . OSA on CPAP 06/25/2017  . Bronchitis 09/24/2016  . Leukocytosis  09/24/2016  . Radiculopathy, lumbar region 07/16/2016  . Degenerative lumbar disc 07/03/2016  . Synovitis of hip 04/20/2016  . Screening for breast cancer 12/28/2014  . Pain in joint involving lower leg 05/13/2013   Past Medical History:  Diagnosis Date  . Anemia   . Anxiety   . Arthritis   . B12 deficiency 06/30/2019  . Depression   . GERD (gastroesophageal reflux disease)    occasionally takes OTC  . Hyperlipidemia   . Hypertension   . Pre-diabetes   . Sleep apnea    tested 2014 - unable to tolerate machine    Family History   Problem Relation Age of Onset  . Hypertension Mother   . Diabetes Mother   . Breast cancer Mother 60  . Hypertension Father   . Lung cancer Maternal Aunt   . Colon polyps Brother   . Colon cancer Neg Hx   . Esophageal cancer Neg Hx   . Rectal cancer Neg Hx   . Stomach cancer Neg Hx     Past Surgical History:  Procedure Laterality Date  . LEFT HEART CATH AND CORONARY ANGIOGRAPHY N/A 10/25/2018   Procedure: LEFT HEART CATH AND CORONARY ANGIOGRAPHY;  Surgeon: Martinique, Peter M, MD;  Location: [facility];  Service: Cardiovascular;  Laterality: N/A;  . TOTAL HIP ARTHROPLASTY Left 09/22/2017   Procedure: LEFT TOTAL HIP ARTHROPLASTY ANTERIOR APPROACH;  Surgeon: Leandrew Koyanagi, MD;  Location: [facility];  Service: Orthopedics;  Laterality: Left;  . TOTAL KNEE ARTHROPLASTY Right 01/12/2018   Procedure: RIGHT TOTAL KNEE ARTHROPLASTY;  Surgeon: Leandrew Koyanagi, MD;  Location: [facility];  Service: Orthopedics;  Laterality: Right;  . TUBAL LIGATION     Social History   Occupational History  . Occupation: unemployed  Tobacco Use  . Smoking status: Current Some Day Smoker    Packs/day: 0.25    Years: 30.00    Pack years: 7.50    Types: Cigarettes  . Smokeless tobacco: Never Used  Substance and Sexual Activity  . Alcohol use: Yes    Comment: occasional  . Drug use: Yes    Types: Marijuana    Comment: smoke 1 joint cannibis 1 week ago.  Marland Kitchen Sexual activity: Yes    Birth control/protection: None    Comment: declined insurance questions,des neg

## 2019-12-07 ENCOUNTER — Other Ambulatory Visit: Payer: Self-pay

## 2019-12-11 ENCOUNTER — Encounter: Payer: Self-pay | Admitting: Obstetrics & Gynecology

## 2019-12-11 ENCOUNTER — Ambulatory Visit (INDEPENDENT_AMBULATORY_CARE_PROVIDER_SITE_OTHER): Payer: Medicare Other | Admitting: Obstetrics & Gynecology

## 2019-12-11 ENCOUNTER — Other Ambulatory Visit: Payer: Self-pay

## 2019-12-11 VITALS — BP 134/82

## 2019-12-11 DIAGNOSIS — N945 Secondary dysmenorrhea: Secondary | ICD-10-CM

## 2019-12-11 DIAGNOSIS — Z6841 Body Mass Index (BMI) 40.0 and over, adult: Secondary | ICD-10-CM

## 2019-12-11 DIAGNOSIS — N920 Excessive and frequent menstruation with regular cycle: Secondary | ICD-10-CM

## 2019-12-11 DIAGNOSIS — D219 Benign neoplasm of connective and other soft tissue, unspecified: Secondary | ICD-10-CM | POA: Diagnosis not present

## 2019-12-11 NOTE — Progress Notes (Signed)
    [place] 1966/08/14 [id number]        54 y.o.  [id number]  Married.  10 grand-children  RP: Preop XI Robotic TLH/Bilateral Salpingectomy for symptomatic fibroids  HPI:  Menorrhagia and Dysmenorrhea associated with Uterine Fibroids resistant to medical/hormonal treatment with the Progestin pill and DepoProvera.   OB History  Gravida Para Term Preterm AB Living  2 0 0 0 1 2  SAB TAB Ectopic Multiple Live Births  1 0 0 0 0    # Outcome Date GA Lbr Len/2nd Weight Sex Delivery Anes PTL Lv  2 Gravida           1 SAB             Past medical history,surgical history, problem list, medications, allergies, family history and social history were all reviewed and documented in the EPIC chart.   Directed ROS with pertinent positives and negatives documented in the history of present illness/assessment and plan.  Exam:  Vitals:   12/11/19 0920  BP: 134/82   General appearance:  Normal  Pelvic US 06/07/2019: T/V images. Anteverted uterus measuring 11.71 x 5.57 x 5.5 cm. Intramural fibroids with the largest measured at 2.7 x 1.8 cm and 4 others measuring less than 1.5 cm. Thin symmetrical endometrial lining measured at 5.0 mm. No endometrial mass seen. Both ovaries normal in size with sparse small follicles. No adnexal mass seen. No free fluid in the posterior cutis sac.   Assessment/Plan:  54 y.o. G2P0012   1. Fibroids Very symptomatic uterine fibroids with menometrorrhagia not controlled on the progestin pill or Depo-Provera injections.  Persistent menometrorrhagia with secondary dysmenorrhea.  Patient declines further attempts to control with hormonal therapy. Decision to proceed with a XI robotic total laparoscopic hysterectomy with bilateral salpingectomy.  Will restart on the progestin pill until surgery in an attempt to decrease the amount of bleeding.  Robotic hysterectomy pamphlet given to patient. Surgery thoroughly reviewed with patient, including procedure, preop  management, postop precautions, surgical risks and benefits.  Patient voiced understanding and agreement with plan.  2. Menorrhagia with regular cycle Menorrhagia which now became menometrorrhagia on the Depo-Provera injection.   Hb 14.9 on 11/24/2019.  3. Secondary dysmenorrhea As above.  4. Class 3 severe obesity due to excess calories without serious comorbidity with body mass index (BMI) of 45.0 to 49.9 in adult [facility]) Obesity problem made worse on Depo-Provera injections due to increased appetite.  Patient declines further Depo-Provera injections.                        Patient was counseled as to the risk of surgery to include the following:  1. Infection (prohylactic antibiotics will be administered)  2. DVT/Pulmonary Embolism (prophylactic pneumo compression stockings will be used)  3.Trauma to internal organs requiring additional surgical procedure to repair any injury to internal organs requiring perhaps additional hospitalization days.  4.Hemmorhage requiring transfusion and blood products which carry risks such as anaphylactic reaction, hepatitis and AIDS  Patient had received literature information on the procedure scheduled and all her questions were answered and fully accepts all risk.  Counseling on above issues and coordination of care more than 50% for 15 minutes.  Princess Bruins MD, 9:53 AM 12/11/2019

## 2019-12-11 NOTE — Patient Instructions (Signed)
1. Fibroids Very symptomatic uterine fibroids with menometrorrhagia not controlled on the progestin pill or Depo-Provera injections.  Persistent menometrorrhagia with secondary dysmenorrhea.  Patient declines further attempts to control with hormonal therapy. Decision to proceed with a XI robotic total laparoscopic hysterectomy with bilateral salpingectomy.  Will restart on the progestin pill until surgery in an attempt to decrease the amount of bleeding.  Robotic hysterectomy pamphlet given to patient. Surgery thoroughly reviewed with patient, including procedure, preop management, postop precautions, surgical risks and benefits.  Patient voiced understanding and agreement with plan.  2. Menorrhagia with regular cycle Menorrhagia which now became menometrorrhagia on the Depo-Provera injection.   Hb 14.9 on 11/24/2019.  3. Secondary dysmenorrhea As above.  4. Class 3 severe obesity due to excess calories without serious comorbidity with body mass index (BMI) of 45.0 to 49.9 in adult [facility]) Obesity problem made worse on Depo-Provera injections due to increased appetite.  Patient declines further Depo-Provera injections.                        Patient was counseled as to the risk of surgery to include the following:  1. Infection (prohylactic antibiotics will be administered)  2. DVT/Pulmonary Embolism (prophylactic pneumo compression stockings will be used)  3.Trauma to internal organs requiring additional surgical procedure to repair any injury to internal organs requiring perhaps additional hospitalization days.  4.Hemmorhage requiring transfusion and blood products which carry risks such as anaphylactic reaction, hepatitis and AIDS  Patient had received literature information on the procedure scheduled and all her questions were answered and fully accepts all risk.  [place], it was a pleasure seeing you today!

## 2019-12-20 ENCOUNTER — Other Ambulatory Visit: Payer: Self-pay | Admitting: Obstetrics & Gynecology

## 2019-12-21 ENCOUNTER — Ambulatory Visit
Admission: RE | Admit: 2019-12-21 | Discharge: 2019-12-21 | Disposition: A | Discharge status: Home / Self Care | Payer: Medicare Other | Source: Ambulatory Visit | Attending: Orthopaedic Surgery | Admitting: Orthopaedic Surgery

## 2019-12-21 ENCOUNTER — Other Ambulatory Visit: Payer: Self-pay

## 2019-12-21 DIAGNOSIS — M5416 Radiculopathy, lumbar region: Secondary | ICD-10-CM

## 2019-12-21 IMAGING — MR MR LUMBAR SPINE W/O CM
4 of 5 series · 24 of 48 positions shown · non-contrast
Comparison: Lumbar radiographs [DATE]. Lumbar MRI [DATE].

CLINICAL DATA: 53-year-old female with low back pain radiating to
the left thigh intermittently. No known injury.

EXAM:
MRI LUMBAR SPINE WITHOUT CONTRAST
TECHNIQUE: Multiplanar, multisequence MR imaging of the lumbar spine was
performed. No intravenous contrast was administered.

[Series 2: T2 · sagittal · 4.0mm · 0.55mm/px · 6 of 16 slices shown (1 of 2)]
[im 1/16]
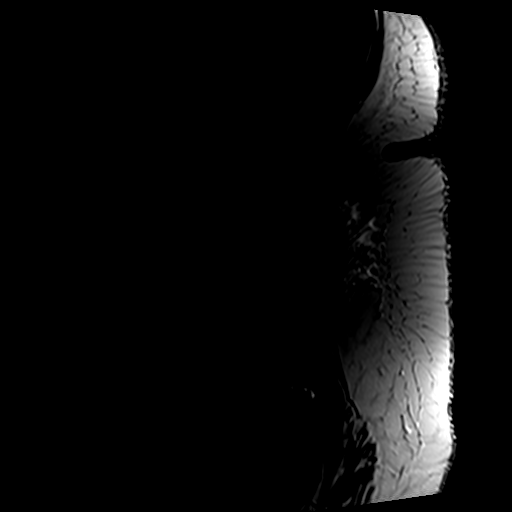
[im 4/16]
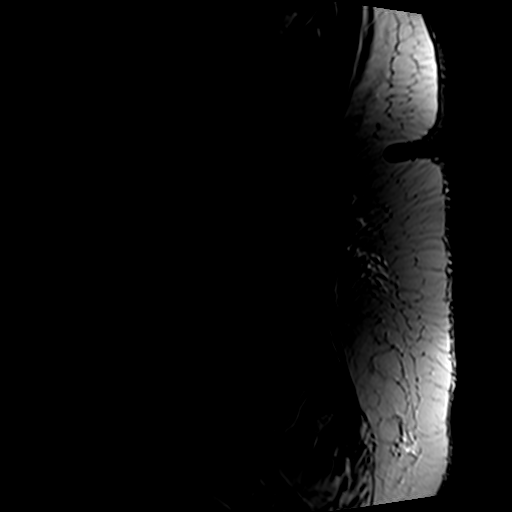
[im 7/16]
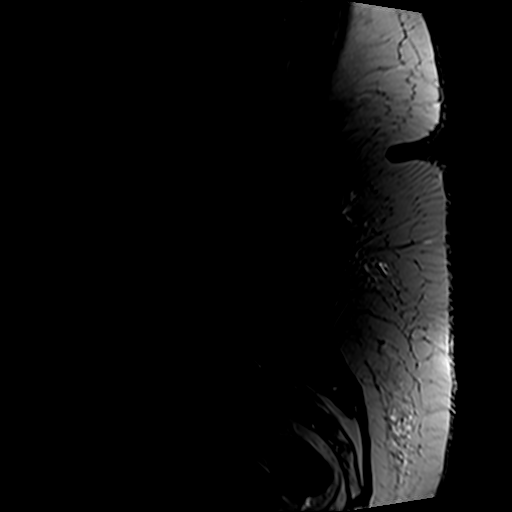
[im 10/16]
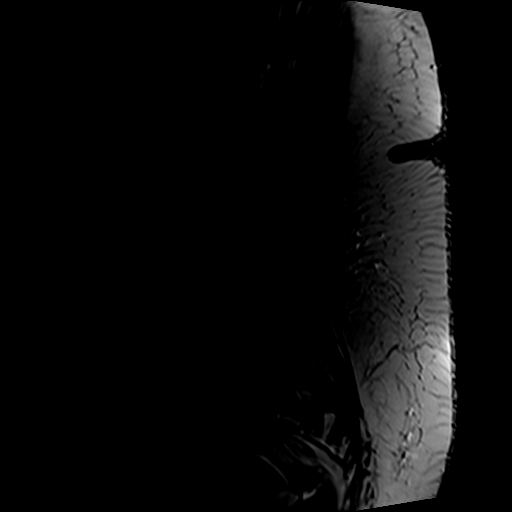
[im 13/16]
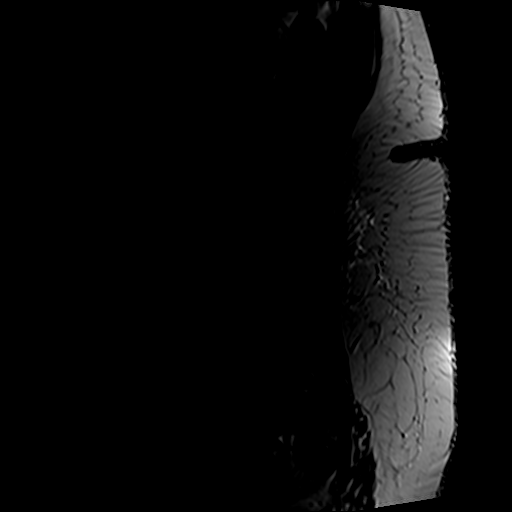
[im 16/16]
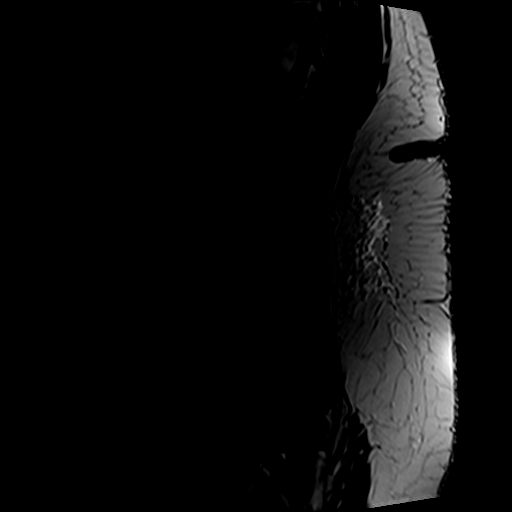

[Series 4: T1 · sagittal · 4.0mm · 0.55mm/px · 6 of 16 slices shown (1 of 2)]
[im 1/16]
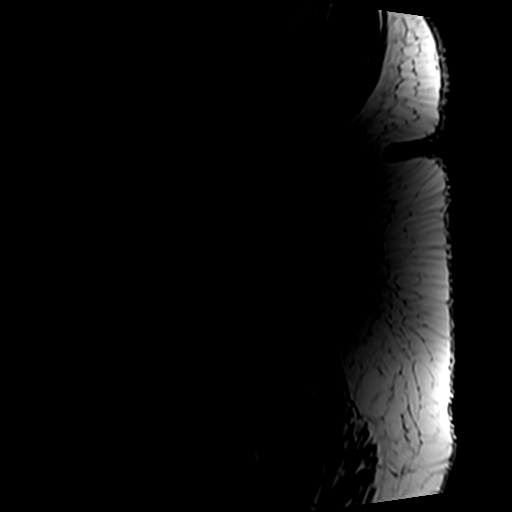
[im 4/16]
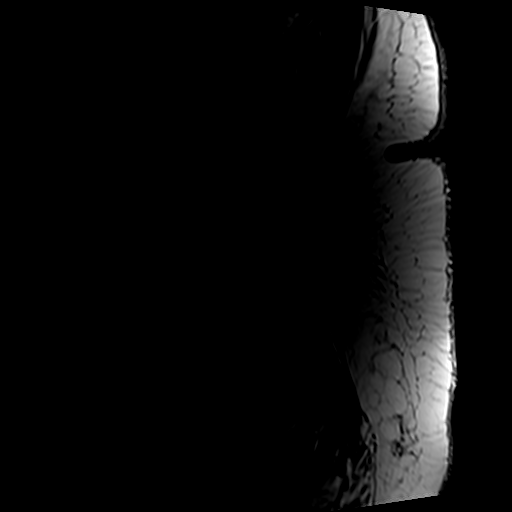
[im 7/16]
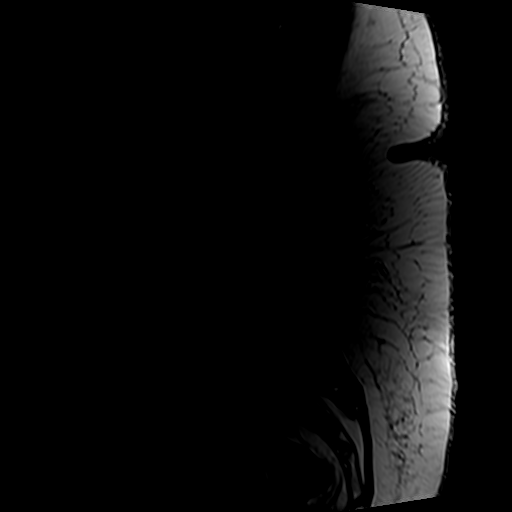
[im 10/16]
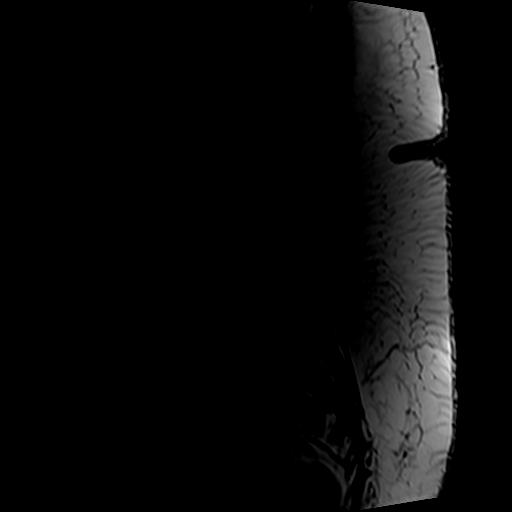
[im 13/16]
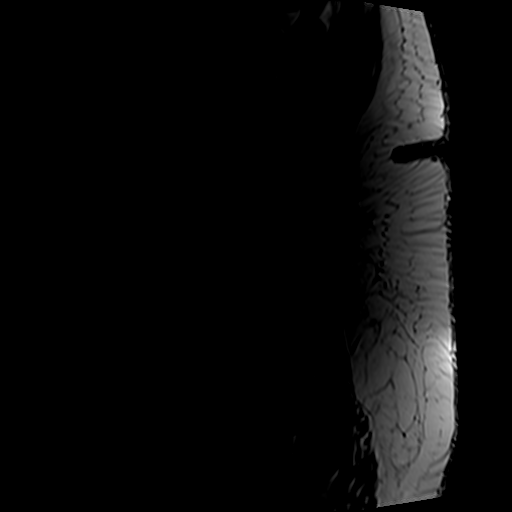
[im 16/16]
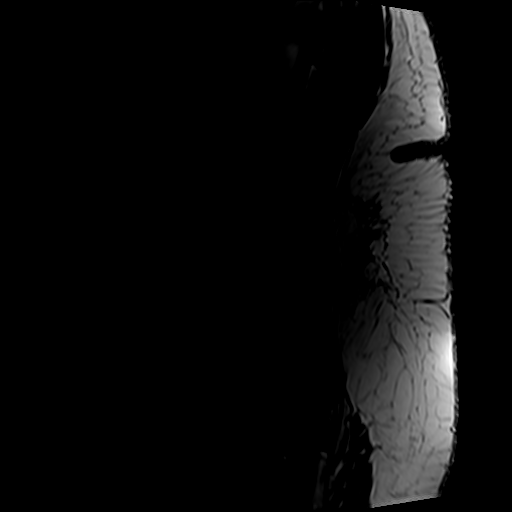

[Series 5: T2 · axial · 4.0mm · 0.78mm/px · z∈[-107,+135]mm · 9 of 42 slices shown (2 of 2)]
[im 1/42]
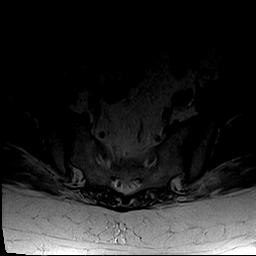
[im 6/42]
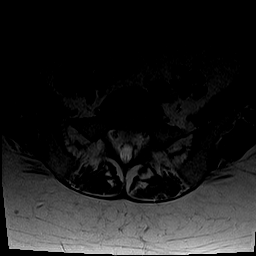
[im 12/42]
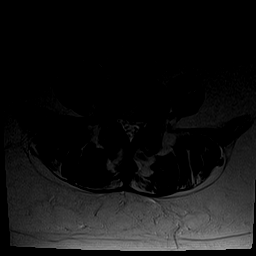
[im 18/42]
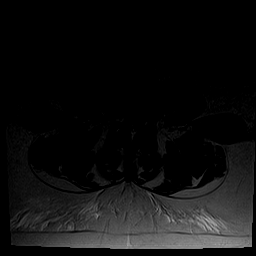
[im 21/42]
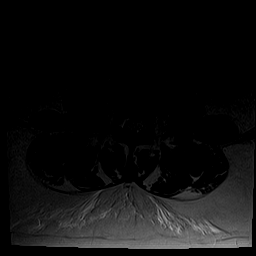
[im 24/42]
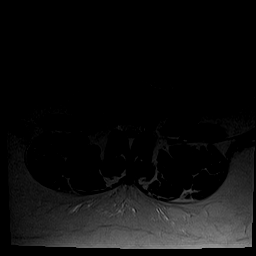
[im 30/42]
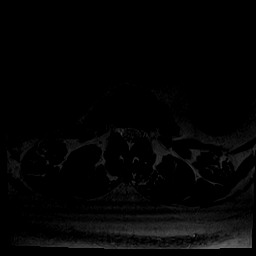
[im 36/42]
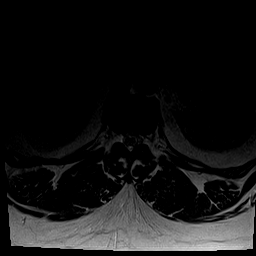
[im 42/42]
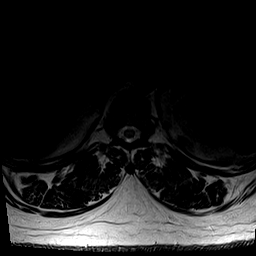

[Series 6: T1 · axial · 4.0mm · 0.39mm/px · z∈[-81,+95]mm · 3 of 42 slices shown (2 of 2)]
[im 6/42]
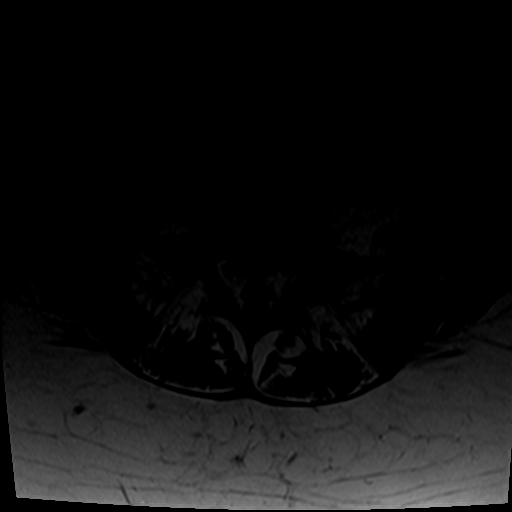
[im 21/42]
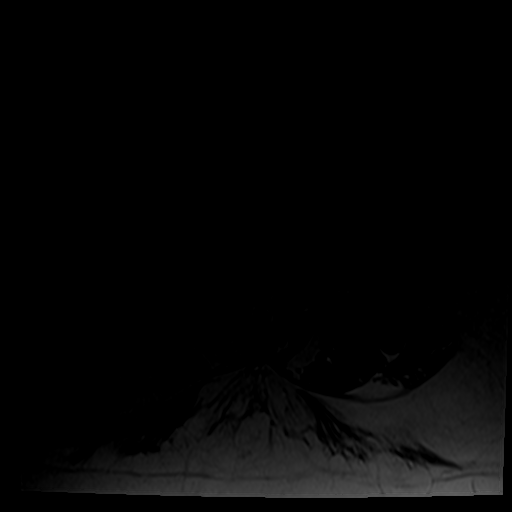
[im 36/42]
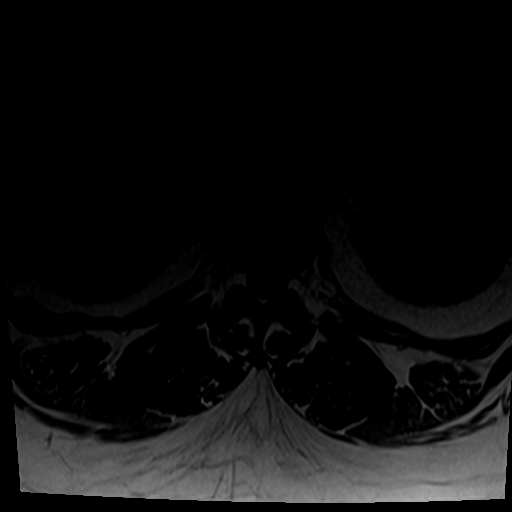

[24 of 48 positions shown; findings below may reference images not displayed]

FINDINGS: Segmentation: Normal on the comparison radiographs, which is the
same numbering system used on the [70] MRI.

Alignment: Mildly improved lumbar lordosis since [70]. Subtle
degenerative anterolisthesis at L4-L5 and retrolisthesis at L5-S1.

Vertebrae: Degenerative endplate marrow edema at T11-T12 and L4-L5
(series 3, image 9). Background bone marrow signal within normal
limits. No other acute osseous abnormality identified. Intact
visible sacrum and SI joints.

Conus medullaris and cauda equina: Conus extends to the L1 level. No
lower spinal cord or conus signal abnormality.

Paraspinal and other soft tissues: Negative.

Disc levels:

T11-T12: Chronic disc desiccation and disc space loss possibly with
vacuum disc now. Chronic circumferential disc bulge, and moderate
posterior element hypertrophy with degenerative facet joint fluid
(series 5, image 3). Mild spinal stenosis. Mild if any cord mass
effect. No associated cord signal abnormality.

T12-L1:  Negative.

L1-L2:  Negative aside from mild facet hypertrophy.

L2-L3: Negative disc. Mild to moderate facet hypertrophy and mild
epidural lipomatosis. No significant stenosis.

L3-L4: Disc desiccation and circumferential disc bulge. Moderate
facet hypertrophy. Epidural lipomatosis. Broad-based posterior
component of disc. Increased mild to moderate spinal stenosis since
[70] (series 5, image 24). No convincing lateral recess or foraminal
stenosis.

L4-L5: Chronic disc desiccation and vacuum disc. Circumferential
disc bulge and moderate posterior element hypertrophy. Broad-based
posterior component of disc. Epidural lipomatosis abates at this
level. Mild spinal and bilateral lateral recess stenosis has
progressed (series 5, image 30). Mild to moderate left and mild
right L4 foraminal stenosis appears stable.

L5-S1: Chronic disc desiccation and mild disc bulge. Mild to
moderate facet hypertrophy. Mild epidural lipomatosis. No spinal or
lateral recess stenosis. Moderate left and mild right L5 foraminal
stenosis appears stable.
IMPRESSION: 1. Intermittent lower thoracic and lumbar disc, endplate, and
posterior element degeneration. Mild degenerative endplate marrow
edema at T11-T12 and L4-L5.
2. Increased multifactorial mild to moderate spinal and/or lateral
recess stenosis since the [70] MRI at L3-L4 and L4-L5.
3. Stable up to moderate moderate left L4 and L5 neural foraminal
stenosis.
4. Mild lower thoracic spinal stenosis at T11-T12. Mild if any cord
mass effect and no cord signal abnormality.

## 2019-12-22 NOTE — Patient Instructions (Signed)
DUE TO COVID-19 ONLY ONE VISITOR IS ALLOWED IN WAITING ROOM (VISITOR WILL HAVE A TEMPERATURE CHECK ON ARRIVAL AND MUST WEAR A FACE MASK THE ENTIRE TIME.)  ONCE YOU ARE ADMITTED TO YOUR PRIVATE ROOM, THE SAME ONE VISITOR IS ALLOWED TO VISIT DURING VISITING HOURS ONLY.  Your COVID swab testing is scheduled for  at 12/23/2019 @ 10:00 am , You must self quarantine after your testing per handout given to you at the testing site.  ([facility] testing enter pre-surgical testing line)     Your procedure is scheduled on: 12/27/2019   Report to:  [facility] AT: 7:00 A. M.   Call this number if you have problems the morning of surgery:[phone].   OUR ADDRESS IS [address].  WE ARE LOCATED IN THE [place]                                   [facility].                                     REMEMBER:   DO NOT EAT FOOD OR DRINK LIQUIDS AFTER MIDNIGHT .    BRUSH YOUR TEETH THE MORNING OF SURGERY.  TAKE THESE MEDICATIONS MORNING OF SURGERY WITH A SIP OF WATER:  Nexium,pravastatin,propranolol  DO NOT WEAR JEWERLY, MAKE UP, OR NAIL POLISH.  DO NOT WEAR LOTIONS, POWDERS, PERFUMES/COLOGNE OR DEODORANT.  DO NOT SHAVE FOR 24 HOURS PRIOR TO DAY OF SURGERY.    CONTACTS, GLASSES, OR DENTURES MAY NOT BE WORN TO SURGERY.                                    [name] IS NOT RESPONSIBLE  FOR ANY BELONGINGS.           BRING ALL PRESCRIPTION MEDICATIONS WITH YOU THE DAY OF SURGERY IN ORIGINAL CONTAINERS                                                                .[facility] - Preparing for Surgery Before surgery, you can play an important role.  Because skin is not sterile, your skin needs to be as free of germs as possible.  You can reduce the number of germs on your skin by washing with CHG (chlorahexidine gluconate) soap before surgery.  CHG is an antiseptic cleaner which kills germs and bonds with the skin to  continue killing germs even after washing. Please DO NOT use if you have an allergy to CHG or antibacterial soaps.  If your skin becomes reddened/irritated stop using the CHG and inform your nurse when you arrive at Short Stay. Do not shave (including legs and underarms) for at least 48 hours prior to the first CHG shower.  You may shave your face/neck. Please follow these instructions carefully:  1.  Shower with CHG Soap the night before surgery and the  morning of Surgery.  2.  If you choose to wash your hair, wash your hair first as  usual with your  normal  shampoo.  3.  After you shampoo, rinse your hair and body thoroughly to remove the  shampoo.                           4.  Use CHG as you would any other liquid soap.  You can apply chg directly  to the skin and wash                       Gently with a scrungie or clean washcloth.  5.  Apply the CHG Soap to your body ONLY FROM THE NECK DOWN.   Do not use on face/ open                           Wound or open sores. Avoid contact with eyes, ears mouth and genitals (private parts).                       Wash face,  Genitals (private parts) with your normal soap.             6.  Wash thoroughly, paying special attention to the area where your surgery  will be performed.  7.  Thoroughly rinse your body with warm water from the neck down.  8.  DO NOT shower/wash with your normal soap after using and rinsing off  the CHG Soap.                9.  Pat yourself dry with a clean towel.            10.  Wear clean pajamas.            11.  Place clean sheets on your bed the night of your first shower and do not  sleep with pets. Day of Surgery : Do not apply any lotions/deodorants the morning of surgery.  Please wear clean clothes to the hospital/surgery center.  FAILURE TO FOLLOW THESE INSTRUCTIONS MAY RESULT IN THE CANCELLATION OF YOUR SURGERY PATIENT SIGNATURE_________________________________  NURSE  SIGNATURE__________________________________  ________________________________________________________________________    Sarah Randall  An incentive spirometer is a tool that can help keep your lungs clear and active. This tool measures how well you are filling your lungs with each breath. Taking long deep breaths may help reverse or decrease the chance of developing breathing (pulmonary) problems (especially infection) following:  A long period of time when you are unable to move or be active. BEFORE THE PROCEDURE   If the spirometer includes an indicator to show your best effort, your nurse or respiratory therapist will set it to a desired goal.  If possible, sit up straight or lean slightly forward. Try not to slouch.  Hold the incentive spirometer in an upright position. INSTRUCTIONS FOR USE  1. Sit on the edge of your bed if possible, or sit up as far as you can in bed or on a chair. 2. Hold the incentive spirometer in an upright position. 3. Breathe out normally. 4. Place the mouthpiece in your mouth and seal your lips tightly around it. 5. Breathe in slowly and as deeply as possible, raising the piston or the ball toward the top of the column. 6. Hold your breath for 3-5 seconds or for as long as possible. Allow the piston or ball to fall to the bottom of the column. 7. Remove the mouthpiece  from your mouth and breathe out normally. 8. Rest for a few seconds and repeat Steps 1 through 7 at least 10 times every 1-2 hours when you are awake. Take your time and take a few normal breaths between deep breaths. 9. The spirometer may include an indicator to show your best effort. Use the indicator as a goal to work toward during each repetition. 10. After each set of 10 deep breaths, practice coughing to be sure your lungs are clear. If you have an incision (the cut made at the time of surgery), support your incision when coughing by placing a pillow or rolled up towels firmly  against it. Once you are able to get out of bed, walk around indoors and cough well. You may stop using the incentive spirometer when instructed by your caregiver.  RISKS AND COMPLICATIONS  Take your time so you do not get dizzy or light-headed.  If you are in pain, you may need to take or ask for pain medication before doing incentive spirometry. It is harder to take a deep breath if you are having pain. AFTER USE  Rest and breathe slowly and easily.  It can be helpful to keep track of a log of your progress. Your caregiver can provide you with a simple table to help with this. If you are using the spirometer at home, follow these instructions: [place] IF:   You are having difficultly using the spirometer.  You have trouble using the spirometer as often as instructed.  Your pain medication is not giving enough relief while using the spirometer.  You develop fever of 100.5 F (38.1 C) or higher. SEEK IMMEDIATE MEDICAL CARE IF:   You cough up bloody sputum that had not been present before.  You develop fever of 102 F (38.9 C) or greater.  You develop worsening pain at or near the incision site. MAKE SURE YOU:   Understand these instructions.  Will watch your condition.  Will get help right away if you are not doing well or get worse. Document Released: 04/05/2007 Document Revised: 02/15/2012 Document Reviewed: 06/06/2007 [facility] Patient Information 2014 [place], [place].   ________________________________________________________________________

## 2019-12-23 ENCOUNTER — Other Ambulatory Visit (HOSPITAL_COMMUNITY)
Admission: RE | Admit: 2019-12-23 | Discharge: 2019-12-23 | Disposition: A | Discharge status: Home / Self Care | Payer: Medicare Other | Source: Ambulatory Visit | Attending: Obstetrics & Gynecology | Admitting: Obstetrics & Gynecology

## 2019-12-23 DIAGNOSIS — Z01812 Encounter for preprocedural laboratory examination: Secondary | ICD-10-CM | POA: Diagnosis present

## 2019-12-23 DIAGNOSIS — Z20822 Contact with and (suspected) exposure to covid-19: Secondary | ICD-10-CM | POA: Diagnosis not present

## 2019-12-24 LAB — NOVEL CORONAVIRUS, NAA (HOSP ORDER, SEND-OUT TO REF LAB; TAT 18-24 HRS): SARS-CoV-2, NAA: NOT DETECTED

## 2019-12-26 ENCOUNTER — Encounter (HOSPITAL_COMMUNITY): Payer: Self-pay

## 2019-12-26 ENCOUNTER — Other Ambulatory Visit: Payer: Self-pay

## 2019-12-26 ENCOUNTER — Encounter (HOSPITAL_COMMUNITY)
Admission: RE | Admit: 2019-12-26 | Discharge: 2019-12-26 | Disposition: A | Discharge status: Home / Self Care | Payer: Medicare Other | Source: Ambulatory Visit | Attending: Obstetrics & Gynecology | Admitting: Obstetrics & Gynecology

## 2019-12-26 ENCOUNTER — Encounter: Payer: Self-pay | Admitting: Orthopaedic Surgery

## 2019-12-26 ENCOUNTER — Ambulatory Visit (INDEPENDENT_AMBULATORY_CARE_PROVIDER_SITE_OTHER): Payer: Medicare Other | Admitting: Orthopaedic Surgery

## 2019-12-26 DIAGNOSIS — G8929 Other chronic pain: Secondary | ICD-10-CM | POA: Diagnosis not present

## 2019-12-26 DIAGNOSIS — Z01818 Encounter for other preprocedural examination: Secondary | ICD-10-CM | POA: Insufficient documentation

## 2019-12-26 DIAGNOSIS — M545 Low back pain, unspecified: Secondary | ICD-10-CM

## 2019-12-26 DIAGNOSIS — F17210 Nicotine dependence, cigarettes, uncomplicated: Secondary | ICD-10-CM | POA: Insufficient documentation

## 2019-12-26 LAB — CBC
HCT: 46.2 % — ABNORMAL HIGH (ref 36.0–46.0)
Hemoglobin: 15.5 g/dL — ABNORMAL HIGH (ref 12.0–15.0)
MCH: 31 pg (ref 26.0–34.0)
MCHC: 33.5 g/dL (ref 30.0–36.0)
MCV: 92.4 fL (ref 80.0–100.0)
Platelets: 406 10*3/uL — ABNORMAL HIGH (ref 150–400)
RBC: 5 MIL/uL (ref 3.87–5.11)
RDW: 13.4 % (ref 11.5–15.5)
WBC: 16.3 10*3/uL — ABNORMAL HIGH (ref 4.0–10.5)
nRBC: 0 % (ref 0.0–0.2)

## 2019-12-26 LAB — HEMOGLOBIN A1C
Hgb A1c MFr Bld: 6.1 % — ABNORMAL HIGH (ref 4.8–5.6)
Mean Plasma Glucose: 128.37 mg/dL

## 2019-12-26 MED ORDER — ACETAMINOPHEN-CODEINE #3 300-30 MG PO TABS: 1.0000 | ORAL_TABLET | Freq: Three times a day (TID) | ORAL | 0 refills | Status: DC | PRN

## 2019-12-26 NOTE — Progress Notes (Addendum)
PCP - Dr. Rebbeca Paul. LOV: 03/2019 as per pt.  Cardiologist -   Chest x-ray -  EKG -  Stress Test -  ECHO -  Cardiac Cath -   Sleep Study - Yes  CPAP - No  Fasting Blood Sugar -  Checks Blood Sugar _____ times a day  Blood Thinner Instructions: Aspirin Instructions: Last Dose:  Anesthesia review: Pt. Was advise to stop smoking and using marihuana at least 24 hours before surgery,pt. Verbalized her understanding of the matter.  Patient denies shortness of breath, fever, cough and chest pain at PAT appointment   Patient verbalized understanding of instructions that were given to them at the PAT appointment. Patient was also instructed that they will need to review over the PAT instructions again at home before surgery.

## 2019-12-26 NOTE — Progress Notes (Signed)
Office Visit Note   Patient: Sarah Randall           Date of Birth: 1966/04/15           MRN: [id number] Visit Date: 12/26/2019              Requested by: Ladell Pier, MD [address] [place],  [place] [address] PCP: Ladell Pier, MD   Assessment & Plan: Visit Diagnoses:  1. Chronic bilateral low back pain, unspecified whether sciatica present     Plan: Impression is chronic lower back pain with evidence of multilevel spinal stenosis seen on recent MRI.  We will now refer the patient to Dr. Ernestina Patches for [facility].  She will follow up with [place] as needed.  I have agreed to call in a prescription of Tylenol 3 but nothing stronger.  Call with concerns or questions in the meantime.  Follow-Up Instructions: Return if symptoms worsen or fail to improve.   Orders:  No orders of the defined types were placed in this encounter.  Meds ordered this encounter  Medications  . acetaminophen-codeine (TYLENOL #3) 300-30 MG tablet    Sig: Take 1 tablet by mouth 3 (three) times daily as needed for moderate pain.    Dispense:  30 tablet    Refill:  0      Procedures: No procedures performed   Clinical Data: No additional findings.   Subjective: Chief Complaint  Patient presents with  . Lower Back - Pain    HPI patient is a pleasant 54 year old female who comes in today to discuss lumbar spine MRI.  MRI from 12/21/2019 shows mild degenerative endplate marrow edema at T11-12 and L4-5, mild to moderate spinal and or lateral recess stenosis L3-4 and L4-5, moderate left L4 and 5 neural foraminal stenosis and mild spinal stenosis at T11-12.  The patient continues to have chronic bilateral lower back pain despite conservative treatment to include oral anti-inflammatories and physical therapy.     Objective: Vital Signs: LMP  (LMP Unknown)     Ortho Exam stable exam of the lumbar spine  Specialty Comments:  No specialty comments available.  Imaging: No new imaging   PMFS  History: Patient Active Problem List   Diagnosis Date Noted  . Iron deficiency 08/03/2019  . B12 deficiency 06/30/2019  . Chronic right shoulder pain 07/05/2018  . Trochanteric bursitis of left hip 07/05/2018  . Morbid obesity ([place]) 07/05/2018  . Body mass index 45.0-49.9, adult ([place]) 07/05/2018  . Marijuana user 05/26/2018  . Status post total right knee replacement 01/12/2018  . Status post total hip replacement, left 09/22/2017  . Hyperlipidemia 08/10/2017  . Primary osteoarthritis of left hip 06/25/2017  . Primary osteoarthritis of right knee 06/25/2017  . Mild depression ([place]) 06/25/2017  . Essential hypertension 06/25/2017  . Tobacco abuse 06/25/2017  . Prediabetes 06/25/2017  . OSA on CPAP 06/25/2017  . Bronchitis 09/24/2016  . Leukocytosis 09/24/2016  . Radiculopathy, lumbar region 07/16/2016  . Degenerative lumbar disc 07/03/2016  . Synovitis of hip 04/20/2016  . Screening for breast cancer 12/28/2014  . Pain in joint involving lower leg 05/13/2013   Past Medical History:  Diagnosis Date  . Anemia   . Anxiety   . Arthritis   . B12 deficiency 06/30/2019  . Depression   . GERD (gastroesophageal reflux disease)    occasionally takes OTC  . Hyperlipidemia   . Hypertension   . Pre-diabetes   . Sleep apnea    tested  2014 - unable to tolerate machine    Family History  Problem Relation Age of Onset  . Hypertension Mother   . Diabetes Mother   . Breast cancer Mother 50  . Hypertension Father   . Lung cancer Maternal Aunt   . Colon polyps Brother   . Colon cancer Neg Hx   . Esophageal cancer Neg Hx   . Rectal cancer Neg Hx   . Stomach cancer Neg Hx     Past Surgical History:  Procedure Laterality Date  . LEFT HEART CATH AND CORONARY ANGIOGRAPHY N/A 10/25/2018   Procedure: LEFT HEART CATH AND CORONARY ANGIOGRAPHY;  Surgeon: Martinique, Peter M, MD;  Location: [facility];  Service: Cardiovascular;  Laterality: N/A;  . TOTAL HIP ARTHROPLASTY Left 09/22/2017    Procedure: LEFT TOTAL HIP ARTHROPLASTY ANTERIOR APPROACH;  Surgeon: Leandrew Koyanagi, MD;  Location[place] [facility];  Service: Orthopedics;  Laterality: Left;  . TOTAL KNEE ARTHROPLASTY Right 01/12/2018   Procedure: RIGHT TOTAL KNEE ARTHROPLASTY;  Surgeon: Leandrew Koyanagi, MD;  Location: [facility];  Service: Orthopedics;  Laterality: Right;  . TUBAL LIGATION     Social History   Occupational History  . Occupation: unemployed  Tobacco Use  . Smoking status: Current Some Day Smoker    Packs/day: 0.25    Years: 30.00    Pack years: 7.50    Types: Cigarettes  . Smokeless tobacco: Never Used  Substance and Sexual Activity  . Alcohol use: Yes    Alcohol/week: 4.0 - 6.0 standard drinks    Types: 4 - 6 Shots of liquor per week    Comment: occasional  . Drug use: Yes    Types: Marijuana    Comment: smoke 1 joint cannibis 1 week ago.  Marland Kitchen Sexual activity: Yes    Birth control/protection: None    Comment: declined insurance questions,des neg

## 2019-12-26 NOTE — Anesthesia Preprocedure Evaluation (Addendum)
Anesthesia Evaluation  Patient identified by MRN, date of birth, ID band Patient awake    Reviewed: Allergy & Precautions, NPO status , Patient's Chart, lab work & pertinent test results  Airway Mallampati: III  TM Distance: >3 FB Neck ROM: Full    Dental  (+) Dental Advisory Given   Pulmonary sleep apnea , Current Smoker and Patient abstained from smoking.,    breath sounds clear to auscultation       Cardiovascular hypertension, Pt. on medications and Pt. on home beta blockers  Rhythm:Regular Rate:Normal     Neuro/Psych Anxiety Depression  Neuromuscular disease    GI/Hepatic Neg liver ROS, GERD  ,  Endo/Other  Morbid obesity  Renal/GU negative Renal ROS     Musculoskeletal  (+) Arthritis ,   Abdominal   Peds  Hematology negative hematology ROS (+)   Anesthesia Other Findings   Reproductive/Obstetrics                            Lab Results  Component Value Date   WBC 14.6 (H) 11/24/2019   HGB 14.9 11/24/2019   HCT 43.3 11/24/2019   MCV 90.8 11/24/2019   PLT 330 11/24/2019   Lab Results  Component Value Date   CREATININE 0.92 06/20/2019   BUN 12 06/20/2019   NA 138 06/20/2019   K 4.0 06/20/2019   CL 100 06/20/2019   CO2 21 06/20/2019    Anesthesia Physical Anesthesia Plan  ASA: III  Anesthesia Plan: General   Post-op Pain Management:    Induction: Intravenous  PONV Risk Score and Plan: 2 and Dexamethasone, Ondansetron and Treatment may vary due to age or medical condition  Airway Management Planned: Oral ETT  Additional Equipment: None  Intra-op Plan:   Post-operative Plan: Extubation in OR  Informed Consent: I have reviewed the patients History and Physical, chart, labs and discussed the procedure including the risks, benefits and alternatives for the proposed anesthesia with the patient or authorized representative who has indicated his/her understanding and  acceptance.     Dental advisory given  Plan Discussed with: CRNA  Anesthesia Plan Comments:        Anesthesia Quick Evaluation

## 2019-12-27 ENCOUNTER — Ambulatory Visit (HOSPITAL_BASED_OUTPATIENT_CLINIC_OR_DEPARTMENT_OTHER): Payer: Medicare Other | Admitting: Anesthesiology

## 2019-12-27 ENCOUNTER — Encounter (HOSPITAL_BASED_OUTPATIENT_CLINIC_OR_DEPARTMENT_OTHER)
Admission: RE | Disposition: A | Discharge status: Home / Self Care | Payer: Self-pay | Source: Home / Self Care | Attending: Obstetrics & Gynecology

## 2019-12-27 ENCOUNTER — Encounter (HOSPITAL_BASED_OUTPATIENT_CLINIC_OR_DEPARTMENT_OTHER): Payer: Self-pay | Admitting: Obstetrics & Gynecology

## 2019-12-27 ENCOUNTER — Ambulatory Visit (HOSPITAL_BASED_OUTPATIENT_CLINIC_OR_DEPARTMENT_OTHER)
Admission: RE | Admit: 2019-12-27 | Discharge: 2019-12-28 | Disposition: A | Discharge status: Home / Self Care | Payer: Medicare Other | Attending: Obstetrics & Gynecology | Admitting: Obstetrics & Gynecology

## 2019-12-27 ENCOUNTER — Ambulatory Visit (HOSPITAL_BASED_OUTPATIENT_CLINIC_OR_DEPARTMENT_OTHER): Payer: Medicare Other | Admitting: Physician Assistant

## 2019-12-27 DIAGNOSIS — G4730 Sleep apnea, unspecified: Secondary | ICD-10-CM | POA: Insufficient documentation

## 2019-12-27 DIAGNOSIS — R7303 Prediabetes: Secondary | ICD-10-CM | POA: Insufficient documentation

## 2019-12-27 DIAGNOSIS — Z9889 Other specified postprocedural states: Secondary | ICD-10-CM

## 2019-12-27 DIAGNOSIS — F419 Anxiety disorder, unspecified: Secondary | ICD-10-CM | POA: Diagnosis not present

## 2019-12-27 DIAGNOSIS — E785 Hyperlipidemia, unspecified: Secondary | ICD-10-CM | POA: Diagnosis not present

## 2019-12-27 DIAGNOSIS — Z6841 Body Mass Index (BMI) 40.0 and over, adult: Secondary | ICD-10-CM | POA: Insufficient documentation

## 2019-12-27 DIAGNOSIS — N945 Secondary dysmenorrhea: Secondary | ICD-10-CM | POA: Insufficient documentation

## 2019-12-27 DIAGNOSIS — N920 Excessive and frequent menstruation with regular cycle: Secondary | ICD-10-CM | POA: Insufficient documentation

## 2019-12-27 DIAGNOSIS — M1990 Unspecified osteoarthritis, unspecified site: Secondary | ICD-10-CM | POA: Insufficient documentation

## 2019-12-27 DIAGNOSIS — G709 Myoneural disorder, unspecified: Secondary | ICD-10-CM | POA: Insufficient documentation

## 2019-12-27 DIAGNOSIS — F17210 Nicotine dependence, cigarettes, uncomplicated: Secondary | ICD-10-CM | POA: Diagnosis not present

## 2019-12-27 DIAGNOSIS — F329 Major depressive disorder, single episode, unspecified: Secondary | ICD-10-CM | POA: Insufficient documentation

## 2019-12-27 DIAGNOSIS — I10 Essential (primary) hypertension: Secondary | ICD-10-CM | POA: Insufficient documentation

## 2019-12-27 DIAGNOSIS — Z79899 Other long term (current) drug therapy: Secondary | ICD-10-CM | POA: Diagnosis not present

## 2019-12-27 DIAGNOSIS — K219 Gastro-esophageal reflux disease without esophagitis: Secondary | ICD-10-CM | POA: Diagnosis not present

## 2019-12-27 DIAGNOSIS — D219 Benign neoplasm of connective and other soft tissue, unspecified: Secondary | ICD-10-CM

## 2019-12-27 DIAGNOSIS — Z8249 Family history of ischemic heart disease and other diseases of the circulatory system: Secondary | ICD-10-CM | POA: Diagnosis not present

## 2019-12-27 DIAGNOSIS — D251 Intramural leiomyoma of uterus: Secondary | ICD-10-CM | POA: Insufficient documentation

## 2019-12-27 HISTORY — PX: ROBOTIC ASSISTED LAPAROSCOPIC HYSTERECTOMY AND SALPINGECTOMY: SHX6379

## 2019-12-27 LAB — TYPE AND SCREEN
ABO/RH(D): O POS
Antibody Screen: NEGATIVE

## 2019-12-27 LAB — ABO/RH: ABO/RH(D): O POS

## 2019-12-27 LAB — POCT PREGNANCY, URINE: Preg Test, Ur: NEGATIVE

## 2019-12-27 LAB — GLUCOSE, CAPILLARY
Glucose-Capillary: 119 mg/dL — ABNORMAL HIGH (ref 70–99)
Glucose-Capillary: 119 mg/dL — ABNORMAL HIGH (ref 70–99)

## 2019-12-27 SURGERY — XI ROBOTIC ASSISTED LAPAROSCOPIC HYSTERECTOMY AND SALPINGECTOMY
Anesthesia: General | Site: Abdomen | Laterality: Bilateral

## 2019-12-27 MED ORDER — CEFAZOLIN SODIUM-DEXTROSE 1-4 GM/50ML-% IV SOLN
INTRAVENOUS | Status: AC
  Filled 2019-12-27: qty 50

## 2019-12-27 MED ORDER — DEXAMETHASONE SODIUM PHOSPHATE 10 MG/ML IJ SOLN
INTRAMUSCULAR | Status: DC | PRN
  Administered 2019-12-27: 5 mg via INTRAVENOUS

## 2019-12-27 MED ORDER — SUCCINYLCHOLINE CHLORIDE 200 MG/10ML IV SOSY
PREFILLED_SYRINGE | INTRAVENOUS | Status: AC
  Filled 2019-12-27: qty 10

## 2019-12-27 MED ORDER — PROPOFOL 10 MG/ML IV BOLUS
INTRAVENOUS | Status: AC
  Filled 2019-12-27: qty 40

## 2019-12-27 MED ORDER — FENTANYL CITRATE (PF) 100 MCG/2ML IJ SOLN
25.0000 ug | INTRAMUSCULAR | Status: DC | PRN
  Filled 2019-12-27: qty 1

## 2019-12-27 MED ORDER — FENTANYL CITRATE (PF) 250 MCG/5ML IJ SOLN
INTRAMUSCULAR | Status: AC
  Filled 2019-12-27: qty 5

## 2019-12-27 MED ORDER — ACETAMINOPHEN 325 MG PO TABS
650.0000 mg | ORAL_TABLET | ORAL | Status: DC | PRN
  Filled 2019-12-27: qty 2

## 2019-12-27 MED ORDER — SODIUM CHLORIDE 0.9 % IR SOLN
Status: DC | PRN
  Administered 2019-12-27: 3000 mL

## 2019-12-27 MED ORDER — SUGAMMADEX SODIUM 500 MG/5ML IV SOLN
INTRAVENOUS | Status: DC | PRN
  Administered 2019-12-27: 300 mg via INTRAVENOUS

## 2019-12-27 MED ORDER — MIDAZOLAM HCL 2 MG/2ML IJ SOLN
INTRAMUSCULAR | Status: AC
  Filled 2019-12-27: qty 2

## 2019-12-27 MED ORDER — HYDROCODONE-ACETAMINOPHEN 5-325 MG PO TABS
ORAL_TABLET | ORAL | Status: AC
  Filled 2019-12-27: qty 1

## 2019-12-27 MED ORDER — LACTATED RINGERS IV SOLN
INTRAVENOUS | Status: DC
  Filled 2019-12-27: qty 1000

## 2019-12-27 MED ORDER — ONDANSETRON HCL 4 MG/2ML IJ SOLN
INTRAMUSCULAR | Status: DC | PRN
  Administered 2019-12-27: 4 mg via INTRAVENOUS

## 2019-12-27 MED ORDER — KETOROLAC TROMETHAMINE 30 MG/ML IJ SOLN
INTRAMUSCULAR | Status: DC | PRN
  Administered 2019-12-27: 30 mg via INTRAVENOUS

## 2019-12-27 MED ORDER — LIDOCAINE 2% (20 MG/ML) 5 ML SYRINGE
INTRAMUSCULAR | Status: AC
  Filled 2019-12-27: qty 5

## 2019-12-27 MED ORDER — LIDOCAINE 2% (20 MG/ML) 5 ML SYRINGE
INTRAMUSCULAR | Status: AC
  Filled 2019-12-27: qty 10

## 2019-12-27 MED ORDER — LIDOCAINE 2% (20 MG/ML) 5 ML SYRINGE
INTRAMUSCULAR | Status: DC | PRN
  Administered 2019-12-27: 100 mg via INTRAVENOUS

## 2019-12-27 MED ORDER — BUPIVACAINE HCL (PF) 0.25 % IJ SOLN
INTRAMUSCULAR | Status: DC | PRN
  Administered 2019-12-27: 10 mL

## 2019-12-27 MED ORDER — KETAMINE HCL 10 MG/ML IJ SOLN
INTRAMUSCULAR | Status: AC
  Filled 2019-12-27: qty 1

## 2019-12-27 MED ORDER — ACETAMINOPHEN 500 MG PO TABS
ORAL_TABLET | ORAL | Status: AC
  Filled 2019-12-27: qty 2

## 2019-12-27 MED ORDER — ACETAMINOPHEN 500 MG PO TABS
1000.0000 mg | ORAL_TABLET | Freq: Once | ORAL | Status: AC
  Administered 2019-12-27: 1000 mg via ORAL
  Filled 2019-12-27: qty 2

## 2019-12-27 MED ORDER — ONDANSETRON HCL 4 MG/2ML IJ SOLN
4.0000 mg | Freq: Once | INTRAMUSCULAR | Status: DC | PRN
  Filled 2019-12-27: qty 2

## 2019-12-27 MED ORDER — MIDAZOLAM HCL 2 MG/2ML IJ SOLN
INTRAMUSCULAR | Status: DC | PRN
  Administered 2019-12-27: 2 mg via INTRAVENOUS

## 2019-12-27 MED ORDER — OXYCODONE-ACETAMINOPHEN 5-325 MG PO TABS
1.0000 | ORAL_TABLET | ORAL | Status: DC | PRN
  Administered 2019-12-28 (×2): 1 via ORAL
  Filled 2019-12-27: qty 1

## 2019-12-27 MED ORDER — FENTANYL CITRATE (PF) 100 MCG/2ML IJ SOLN
INTRAMUSCULAR | Status: AC
  Filled 2019-12-27: qty 2

## 2019-12-27 MED ORDER — PROPOFOL 10 MG/ML IV BOLUS
INTRAVENOUS | Status: DC | PRN
  Administered 2019-12-27: 200 mg via INTRAVENOUS

## 2019-12-27 MED ORDER — DEXAMETHASONE SODIUM PHOSPHATE 10 MG/ML IJ SOLN
INTRAMUSCULAR | Status: AC
  Filled 2019-12-27: qty 1

## 2019-12-27 MED ORDER — CEFAZOLIN SODIUM-DEXTROSE 2-4 GM/100ML-% IV SOLN
INTRAVENOUS | Status: AC
  Filled 2019-12-27: qty 100

## 2019-12-27 MED ORDER — LIDOCAINE 20MG/ML (2%) 15 ML SYRINGE OPTIME
INTRAMUSCULAR | Status: DC | PRN
  Administered 2019-12-27: 1.5 mg/kg/h via INTRAVENOUS

## 2019-12-27 MED ORDER — ROCURONIUM BROMIDE 10 MG/ML (PF) SYRINGE
PREFILLED_SYRINGE | INTRAVENOUS | Status: DC | PRN
  Administered 2019-12-27: 60 mg via INTRAVENOUS
  Administered 2019-12-27: 30 mg via INTRAVENOUS
  Administered 2019-12-27: 10 mg via INTRAVENOUS

## 2019-12-27 MED ORDER — HYDROMORPHONE HCL 1 MG/ML IJ SOLN
0.5000 mg | INTRAMUSCULAR | Status: DC | PRN
  Administered 2019-12-28: 05:00:00 0.5 mg via INTRAVENOUS
  Filled 2019-12-27: qty 0.5

## 2019-12-27 MED ORDER — ONDANSETRON HCL 4 MG/2ML IJ SOLN
INTRAMUSCULAR | Status: AC
  Filled 2019-12-27: qty 2

## 2019-12-27 MED ORDER — ROCURONIUM BROMIDE 10 MG/ML (PF) SYRINGE
PREFILLED_SYRINGE | INTRAVENOUS | Status: AC
  Filled 2019-12-27: qty 10

## 2019-12-27 MED ORDER — HYDROCODONE-ACETAMINOPHEN 5-325 MG PO TABS
ORAL_TABLET | ORAL | Status: AC
  Filled 2019-12-27: qty 2

## 2019-12-27 MED ORDER — KETOROLAC TROMETHAMINE 30 MG/ML IJ SOLN
INTRAMUSCULAR | Status: AC
  Filled 2019-12-27: qty 1

## 2019-12-27 MED ORDER — CEFAZOLIN SODIUM-DEXTROSE 2-4 GM/100ML-% IV SOLN
2.0000 g | INTRAVENOUS | Status: DC
  Filled 2019-12-27: qty 100

## 2019-12-27 MED ORDER — HYDROCODONE-ACETAMINOPHEN 5-325 MG PO TABS
1.0000 | ORAL_TABLET | ORAL | Status: DC | PRN
  Administered 2019-12-27 (×2): 2 via ORAL
  Administered 2019-12-27 (×2): 1 via ORAL
  Administered 2019-12-28: 2 via ORAL
  Filled 2019-12-27: qty 2

## 2019-12-27 MED ORDER — FENTANYL CITRATE (PF) 250 MCG/5ML IJ SOLN
INTRAMUSCULAR | Status: DC | PRN
  Administered 2019-12-27: 100 ug via INTRAVENOUS
  Administered 2019-12-27 (×3): 50 ug via INTRAVENOUS
  Administered 2019-12-27: 100 ug via INTRAVENOUS

## 2019-12-27 MED ORDER — KETAMINE HCL 10 MG/ML IJ SOLN
INTRAMUSCULAR | Status: DC | PRN
  Administered 2019-12-27: 25 mg via INTRAVENOUS

## 2019-12-27 MED ORDER — DEXTROSE 5 % IV SOLN
3.0000 g | Freq: Once | INTRAVENOUS | Status: AC
  Administered 2019-12-27: 3 g via INTRAVENOUS
  Filled 2019-12-27: qty 3000

## 2019-12-27 SURGICAL SUPPLY — 68 items
BARRIER ADHS 3X4 INTERCEED (GAUZE/BANDAGES/DRESSINGS) IMPLANT
CANISTER SUCT 3000ML PPV (MISCELLANEOUS) ×2 IMPLANT
CATH FOLEY 3WAY  5CC 16FR (CATHETERS) ×1
CATH FOLEY 3WAY 5CC 16FR (CATHETERS) ×1 IMPLANT
COVER BACK TABLE 60X90IN (DRAPES) ×2 IMPLANT
COVER SURGICAL LIGHT HANDLE (MISCELLANEOUS) ×1 IMPLANT
COVER TIP SHEARS 8 DVNC (MISCELLANEOUS) ×1 IMPLANT
COVER TIP SHEARS 8MM DA VINCI (MISCELLANEOUS) ×1
DECANTER SPIKE VIAL GLASS SM (MISCELLANEOUS) ×4 IMPLANT
DEFOGGER SCOPE WARMER CLEARIFY (MISCELLANEOUS) ×2 IMPLANT
DERMABOND ADVANCED (GAUZE/BANDAGES/DRESSINGS) ×1
DERMABOND ADVANCED .7 DNX12 (GAUZE/BANDAGES/DRESSINGS) ×1 IMPLANT
DRAPE ARM DVNC X/XI (DISPOSABLE) ×4 IMPLANT
DRAPE COLUMN DVNC XI (DISPOSABLE) ×1 IMPLANT
DRAPE DA VINCI XI ARM (DISPOSABLE) ×4
DRAPE DA VINCI XI COLUMN (DISPOSABLE) ×1
DRAPE INCISE 23X17 IOBAN STRL (DRAPES) ×1
DRAPE INCISE 23X17 STRL (DRAPES) IMPLANT
DRAPE INCISE IOBAN 23X17 STRL (DRAPES) ×1 IMPLANT
DURAPREP 26ML APPLICATOR (WOUND CARE) ×2 IMPLANT
ELECT REM PT RETURN 9FT ADLT (ELECTROSURGICAL) ×2
ELECTRODE REM PT RTRN 9FT ADLT (ELECTROSURGICAL) ×1 IMPLANT
GAUZE PETROLATUM 1 X8 (GAUZE/BANDAGES/DRESSINGS) ×2 IMPLANT
GLOVE BIO SURGEON STRL SZ 6.5 (GLOVE) ×9 IMPLANT
GLOVE BIO SURGEON STRL SZ7 (GLOVE) ×2 IMPLANT
GLOVE BIO SURGEON STRL SZ7.5 (GLOVE) ×2 IMPLANT
GLOVE BIOGEL PI IND STRL 6.5 (GLOVE) IMPLANT
GLOVE BIOGEL PI IND STRL 7.0 (GLOVE) ×5 IMPLANT
GLOVE BIOGEL PI IND STRL 7.5 (GLOVE) IMPLANT
GLOVE BIOGEL PI INDICATOR 6.5 (GLOVE) ×3
GLOVE BIOGEL PI INDICATOR 7.0 (GLOVE) ×8
GLOVE BIOGEL PI INDICATOR 7.5 (GLOVE) ×4
IRRIG SUCT STRYKERFLOW 2 WTIP (MISCELLANEOUS) ×2
IRRIGATION SUCT STRKRFLW 2 WTP (MISCELLANEOUS) ×1 IMPLANT
LEGGING LITHOTOMY PAIR STRL (DRAPES) ×2 IMPLANT
MANIFOLD NEPTUNE II (INSTRUMENTS) ×1 IMPLANT
OBTURATOR OPTICAL STANDARD 8MM (TROCAR) ×1
OBTURATOR OPTICAL STND 8 DVNC (TROCAR) ×1
OBTURATOR OPTICALSTD 8 DVNC (TROCAR) ×1 IMPLANT
OCCLUDER COLPOPNEUMO (BALLOONS) ×3 IMPLANT
PACK ROBOT WH (CUSTOM PROCEDURE TRAY) ×2 IMPLANT
PACK ROBOTIC GOWN (GOWN DISPOSABLE) ×2 IMPLANT
PACK TRENDGUARD 450 HYBRID PRO (MISCELLANEOUS) IMPLANT
PAD PREP 24X48 CUFFED NSTRL (MISCELLANEOUS) ×2 IMPLANT
POUCH ENDO CATCH II 15MM (MISCELLANEOUS) IMPLANT
PROTECTOR NERVE ULNAR (MISCELLANEOUS) ×4 IMPLANT
RTRCTR WOUND ALEXIS 18CM SML (INSTRUMENTS)
SAVER CELL AAL HAEMONETICS (INSTRUMENTS) IMPLANT
SEAL CANN UNIV 5-8 DVNC XI (MISCELLANEOUS) ×4 IMPLANT
SEAL XI 5MM-8MM UNIVERSAL (MISCELLANEOUS) ×4
SET IRRIG Y TYPE TUR BLADDER L (SET/KITS/TRAYS/PACK) IMPLANT
SET TRI-LUMEN FLTR TB AIRSEAL (TUBING) ×2 IMPLANT
SUT ETHIBOND 0 (SUTURE) IMPLANT
SUT VIC AB 4-0 PS2 27 (SUTURE) ×6 IMPLANT
SUT VICRYL 0 UR6 27IN ABS (SUTURE) ×2 IMPLANT
SUT VLOC 180 0 9IN  GS21 (SUTURE) ×1
SUT VLOC 180 0 9IN GS21 (SUTURE) ×1 IMPLANT
SUT VLOC 180 2-0 6IN GS21 (SUTURE) IMPLANT
TIP RUMI ORANGE 6.7MMX12CM (TIP) IMPLANT
TIP UTERINE 5.1X6CM LAV DISP (MISCELLANEOUS) IMPLANT
TIP UTERINE 6.7X10CM GRN DISP (MISCELLANEOUS) ×1 IMPLANT
TIP UTERINE 6.7X6CM WHT DISP (MISCELLANEOUS) IMPLANT
TIP UTERINE 6.7X8CM BLUE DISP (MISCELLANEOUS) IMPLANT
TOWEL OR 17X26 10 PK STRL BLUE (TOWEL DISPOSABLE) ×2 IMPLANT
TRENDGUARD 450 HYBRID PRO PACK (MISCELLANEOUS) ×2
TROCAR HASSON GELL 12X100 (TROCAR) IMPLANT
TROCAR PORT AIRSEAL 5X120 (TROCAR) ×2 IMPLANT
WATER STERILE IRR 1000ML POUR (IV SOLUTION) ×2 IMPLANT

## 2019-12-27 NOTE — Op Note (Signed)
Operative Note  12/27/2019  11:24 AM  PATIENT:  Sarah Randall  54 y.o. female  PRE-OPERATIVE DIAGNOSIS:  Fibroids, menorrhagia, dysmenorrhea  POST-OPERATIVE DIAGNOSIS:  Fibroids, menorrhagia, dysmenorrhea  PROCEDURE:  Procedure(s): XI ROBOTIC ASSISTED TOTAL LAPAROSCOPIC HYSTERECTOMY AND BILATERAL SALPINGECTOMY  SURGEON:  Surgeon(s): Princess Bruins, MD  ANESTHESIA:   general  FINDINGS: Myomatous uterus with bilateral tubes s/p tubal ligation.  Ovaries normal bilaterally.  DESCRIPTION OF OPERATION: Under general anesthesia with endotracheal intubation the patient is in lithotomy position.  She is prepped with DuraPrep on the abdomen and with Betadine on the suprapubic, vulvar and vaginal areas.  She is draped as usual.  Timeout is done.  The Foley is put in place in the bladder.  The weighted speculum is inserted in the vagina and the anterior lip of the cervix is grasped with a tenaculum.  The medium Koh ring with a #10 Rumi are put in place for manipulation of the uterus.  The other instruments are removed from the vagina.  We go to the abdomen.      The supraumbilical area is infiltrated with Marcaine one quarter plain.  We make a 1.5 cm incision at that level with the scalpel.  The aponeurosis is grasped with cokers.  The aponeurosis is open with Mayo scissors.  The parietal peritoneum is opened bluntly with the finger.  A pursestring stitch of Vicryl 0 is done on the aponeurosis.  The Hossein is inserted at that level under direct vision.  A pneumoperitoneum is created with CO2.  The camera is inserted at that level.  Inspection of the abdominal and pelvic cavities reveals no pathology in the abdomen and in the pelvis a myomatous uterus with bilateral tubal sterilization and 2 normal ovaries.  The anterior wall of the abdomen is free of adhesions.  The camera is removed.  We marked the skin for the robotic ports and the assistant port.  The robotic ports are in line with the camera port  and the assistant port is just slightly higher.  Infiltration of Marcaine one quarter plain at all sites.  Small incisions are made with a scalpel at those sites.  2 robotic ports are inserted on the right under direct vision.  1 robotic port on the lateral left under direct vision and the 5 mm assistant port on the medial left under direct vision.  The robot is docked.  Targeting is done.  Robotic instruments are inserted under direct vision with the fenestrated grasper in the fourth arm, the EndoShears scissors and the third arm and the PK in the first arm.  We go to the console.      Pictures are taken of the uterus, tubes and ovaries.  Both ureters are visualized in normal anatomic position.  We start on the left side.  The left mesosalpinx is cauterized and sectioned.  The distal part of the tube is removed from the pelvic cavity by the assistant.  We cauterized and section the left utero-ovarian ligament.  We cauterized and section the left round ligament.  We descent along the left broad ligament.  The visceral peritoneum is opened anteriorly.  We proceeded exactly the same way on the right side.  The visceral peritoneum is completely open anteriorly and the bladder is decended well past the [facility] ring.  We then cauterized the backflow on either side of the uterus.  The left uterine artery is cauterized and sectioned.  The right uterine artery is also cauterized and sectioned.  The vaginal  occluder is filled.  The top of the vagina is opened with the tip of the EndoShears scissors above the Koh ring anteriorly, then posteriorly and finally on either side to completely detached the uterus with the cervix and the proximal tubes.  The uterus is passed vaginally and sent to pathology together with the bilateral distal tubes.  The vaginal occluder is put back in the vagina.  We changed the instruments to the cutting needle driver in the third arm, the long tip in the first arm and the PK in the fourth arm.  A  V-Lock 0 at 9 inches is used to close the vaginal vault.  We started at the right angle of the vaginal vault and do a running suture all the way to the left angle and then back to the midline.  The stitch is removed from the abdominopelvic cavities.  Hemostasis is adequate at all levels.  Good peristalsis is present at both ureters.  Urine is clear.  All robotic instruments are removed.  The robot is undocked.  All ports are removed.  The CO2 is evacuated.  The pursestring stitch is attached at the supraumbilical incision.  All incisions are closed with separate stitches of Vicryl 4-0.  Dermabond is added on all incisions.  Hemostasis was adequate at all incisions.  The occluder was removed from the vagina.  The patient was brought to recovery room in good and stable status.  ESTIMATED BLOOD LOSS: 50 mL   Intake/Output Summary (Last 24 hours) at 12/27/2019 1124 Last data filed at 12/27/2019 1100 Gross per 24 hour  Intake 1100 ml  Output 75 ml  Net 1025 ml     BLOOD ADMINISTERED:none   LOCAL MEDICATIONS USED:  MARCAINE     SPECIMEN:  Source of Specimen:  Uterus with cervix and bilateral tubes  DISPOSITION OF SPECIMEN:  PATHOLOGY  COUNTS:  YES  PLAN OF CARE: Transfer to PACU  Marie-Lyne LavoieMD11:24 AM

## 2019-12-27 NOTE — Anesthesia Procedure Notes (Signed)
Procedure Name: Intubation Date/Time: 12/27/2019 8:45 AM Performed by: Lollie Sails, CRNA Pre-anesthesia Checklist: Patient identified, Emergency Drugs available, Suction available, Patient being monitored and Timeout performed Patient Re-evaluated:Patient Re-evaluated prior to induction Oxygen Delivery Method: Circle system utilized Preoxygenation: Pre-oxygenation with 100% oxygen Induction Type: IV induction Ventilation: Mask ventilation without difficulty Laryngoscope Size: Miller and 3 Grade View: Grade I Tube type: Oral Tube size: 7.5 mm Number of attempts: 1 Airway Equipment and Method: Stylet Placement Confirmation: ETT inserted through vocal cords under direct vision,  positive ETCO2 and breath sounds checked- equal and bilateral Secured at: 22 cm Tube secured with: Tape Dental Injury: Teeth and Oropharynx as per pre-operative assessment

## 2019-12-27 NOTE — H&P (Signed)
Sarah Randall is an 54 y.o. female. [id number]  Married.  10 grand-children  RP: XI Robotic TLH/Bilateral Salpingectomy for symptomatic fibroids  HPI:  No change x last visit 12/11/2019.  Menorrhagia and Dysmenorrhea associated with Uterine Fibroids resistant to medical/hormonal treatment with the Progestin pill and DepoProvera.   Menstrual History: No LMP recorded.    Past Medical History:  Diagnosis Date  . Anemia   . Anxiety   . Arthritis   . B12 deficiency 06/30/2019  . Depression   . GERD (gastroesophageal reflux disease)    occasionally takes OTC  . Hyperlipidemia   . Hypertension   . Pre-diabetes   . Sleep apnea    tested 2014 - unable to tolerate machine    Past Surgical History:  Procedure Laterality Date  . LEFT HEART CATH AND CORONARY ANGIOGRAPHY N/A 10/25/2018   Procedure: LEFT HEART CATH AND CORONARY ANGIOGRAPHY;  Surgeon: Martinique, Peter M, MD;  Location: [facility];  Service: Cardiovascular;  Laterality: N/A;  . TOTAL HIP ARTHROPLASTY Left 09/22/2017   Procedure: LEFT TOTAL HIP ARTHROPLASTY ANTERIOR APPROACH;  Surgeon: Leandrew Koyanagi, MD;  Location: [facility];  Service: Orthopedics;  Laterality: Left;  . TOTAL KNEE ARTHROPLASTY Right 01/12/2018   Procedure: RIGHT TOTAL KNEE ARTHROPLASTY;  Surgeon: Leandrew Koyanagi, MD;  Location: [facility];  Service: Orthopedics;  Laterality: Right;  . TUBAL LIGATION      Family History  Problem Relation Age of Onset  . Hypertension Mother   . Diabetes Mother   . Breast cancer Mother 81  . Hypertension Father   . Lung cancer Maternal Aunt   . Colon polyps Brother   . Colon cancer Neg Hx   . Esophageal cancer Neg Hx   . Rectal cancer Neg Hx   . Stomach cancer Neg Hx     Social History:  reports that she has been smoking cigarettes. She has a 7.50 pack-year smoking history. She has never used smokeless tobacco. She reports current alcohol use of about 4.0 - 6.0 standard drinks of alcohol per week. She reports current drug use.  Drug: Marijuana.  Allergies:  Allergies  Allergen Reactions  . Lipitor [Atorvastatin] Other (See Comments)    Stabbing pains in legs  . Zoloft [Sertraline Hcl] Other (See Comments)    tremors    Medications Prior to Admission  Medication Sig Dispense Refill Last Dose  . acetaminophen-codeine (TYLENOL #3) 300-30 MG tablet Take 1 tablet by mouth 3 (three) times daily as needed for moderate pain. 30 tablet 0 12/26/2019 at Unknown time  . esomeprazole (NEXIUM) 40 MG capsule Take 1 capsule (40 mg total) by mouth daily. 90 capsule 3 12/27/2019 at 0600  . hydrochlorothiazide (HYDRODIURIL) 12.5 MG tablet Take 1 tablet (12.5 mg total) by mouth daily. 30 tablet 6 12/26/2019 at Unknown time  . iron polysaccharides (NU-IRON) 150 MG capsule Take 1 capsule (150 mg total) by mouth daily. 30 capsule 3 12/26/2019 at Unknown time  . losartan (COZAAR) 50 MG tablet Take 1 tablet (50 mg total) by mouth daily. 90 tablet 3 12/26/2019 at Unknown time  . megestrol (MEGACE) 40 MG tablet Take one tab po bid daily until surgery. (Patient taking differently: Take 40 mg by mouth 2 (two) times daily. ) 90 tablet 0 12/25/2019  . nitroGLYCERIN (NITROSTAT) 0.4 MG SL tablet Place 1 tablet (0.4 mg total) under the tongue every 5 (five) minutes as needed. 25 tablet 11   . pravastatin (PRAVACHOL) 20 MG tablet 1 tab Q  Mon and Fri (Patient taking differently: Take 20 mg by mouth 2 (two) times a week. ) 90 tablet 3 12/25/2019  . propranolol (INDERAL) 10 MG tablet Take 1 tablet (10 mg total) by mouth 2 (two) times daily. 180 tablet 3 12/26/2019 at 1000  . tiZANidine (ZANAFLEX) 2 MG tablet Take 1 tablet (2 mg total) by mouth every 8 (eight) hours as needed for muscle spasms. (Patient not taking: Reported on 12/22/2019) 30 tablet 0 12/24/2019 at Unknown time  . traMADol (ULTRAM) 50 MG tablet Take 1 tablet (50 mg total) by mouth every 6 (six) hours as needed. (Patient not taking: Reported on 12/22/2019) 20 tablet 0 12/24/2019 at Unknown time     REVIEW OF SYSTEMS: A ROS was performed and pertinent positives and negatives are included in the history.  GENERAL: No fevers or chills. HEENT: No change in vision, no earache, sore throat or sinus congestion. NECK: No pain or stiffness. CARDIOVASCULAR: No chest pain or pressure. No palpitations. PULMONARY: No shortness of breath, cough or wheeze. GASTROINTESTINAL: No abdominal pain, nausea, vomiting or diarrhea, melena or bright red blood per rectum. GENITOURINARY: No urinary frequency, urgency, hesitancy or dysuria. MUSCULOSKELETAL: No joint or muscle pain, no back pain, no recent trauma. DERMATOLOGIC: No rash, no itching, no lesions. ENDOCRINE: No polyuria, polydipsia, no heat or cold intolerance. No recent change in weight. HEMATOLOGICAL: No anemia or easy bruising or bleeding. NEUROLOGIC: No headache, seizures, numbness, tingling or weakness. PSYCHIATRIC: No depression, no loss of interest in normal activity or change in sleep pattern.     Blood pressure (!) 132/105, pulse (!) 110, temperature 98 F (36.7 C), temperature source Oral, resp. rate 18, height 5' 3.5" (1.613 m), weight 122.9 kg, SpO2 100 %.  Physical Exam:  See office notes   Results for orders placed or performed during the hospital encounter of 12/27/19 (from the past 24 hour(s))  Glucose, capillary     Status: Abnormal   Collection Time: 12/27/19  7:37 AM  Result Value Ref Range   Glucose-Capillary 119 (H) 70 - 99 mg/dL  Pregnancy, urine POC     Status: None   Collection Time: 12/27/19  8:05 AM  Result Value Ref Range   Preg Test, Ur NEGATIVE NEGATIVE   Covid Negative  Hb 12/26/2019 15.5  Pelvic US 06/07/2019:T/V images. Anteverted uterus measuring 11.71 x 5.57 x 5.5 cm. Intramural fibroids with the largest measured at 2.7 x 1.8 cm and 4 others measuring less than 1.5 cm. Thin symmetrical endometrial lining measured at 5.0 mm. No endometrial mass seen. Both ovaries normal in size with sparse small follicles. No  adnexal mass seen. No free fluid in the posterior cutis sac.   Assessment/Plan:53 [id number]   1. Fibroids Very symptomatic uterine fibroids with menometrorrhagia not controlled on the progestin pill or Depo-Provera injections. Persistent menometrorrhagia with secondary dysmenorrhea. Patient declines further attempts to control with hormonal therapy. Decision to proceed with a XI robotic total laparoscopic hysterectomy with bilateral salpingectomy. Will restart on the progestin pill until surgery in an attempt to decrease the amount of bleeding. Robotic hysterectomy pamphlet given to patient. Surgery thoroughly reviewed with patient, including procedure, preop management, postop precautions, surgical risks and benefits. Patient voiced understanding and agreement with plan.  2. Menorrhagia with regular cycle Menorrhagia which now became menometrorrhagia on the Depo-Provera injection.   Hb 14.9 on 11/24/2019.  3. Secondary dysmenorrhea As above.  4. Class 3 severe obesity due to excess calories without serious comorbidity with body mass index (BMI) of  45.0 to 49.9 in adult [facility]) Obesity problem made worse on Depo-Provera injections due to increased appetite. Patient declines further Depo-Provera injections.                        Patient was counseled as to the risk of surgery to include the following:  1. Infection (prohylactic antibiotics will be administered)  2. DVT/Pulmonary Embolism (prophylactic pneumo compression stockings will be used)  3.Trauma to internal organs requiring additional surgical procedure to repair any injury to internal organs requiring perhaps additional hospitalization days.  4.Hemmorhage requiring transfusion and blood products which carry risks such as anaphylactic reaction, hepatitis and AIDS  Patient had received literature information on the procedure scheduled and all her questions were answered and fully accepts all  risk.   Marie-Lyne Kruz Chiu 12/27/2019, 8:10 AM

## 2019-12-27 NOTE — Anesthesia Postprocedure Evaluation (Signed)
Anesthesia Post Note  Patient: Sarah Randall  Procedure(s) Performed: XI ROBOTIC ASSISTED TOTAL LAPAROSCOPIC HYSTERECTOMY AND SALPINGECTOMY (Bilateral Abdomen)     Patient location during evaluation: PACU Anesthesia Type: General Level of consciousness: awake and alert Pain management: pain level controlled Vital Signs Assessment: post-procedure vital signs reviewed and stable Respiratory status: spontaneous breathing, nonlabored ventilation, respiratory function stable and patient connected to nasal cannula oxygen Cardiovascular status: blood pressure returned to baseline and stable Postop Assessment: no apparent nausea or vomiting Anesthetic complications: no    Last Vitals:  Vitals:   12/27/19 1315 12/27/19 1423  BP: 123/76 (!) 137/97  Pulse: 90 94  Resp: 18 16  Temp:  (!) 36.4 C  SpO2: 100% 98%    Last Pain:  Vitals:   12/27/19 1423  TempSrc:   PainSc: 8                  Tiajuana Amass

## 2019-12-27 NOTE — Transfer of Care (Signed)
Immediate Anesthesia Transfer of Care Note  Patient: Sarah Randall  Procedure(s) Performed: XI ROBOTIC ASSISTED TOTAL LAPAROSCOPIC HYSTERECTOMY AND SALPINGECTOMY (Bilateral Abdomen)  Patient Location: PACU  Anesthesia Type:General  Level of Consciousness: drowsy, patient cooperative and responds to stimulation  Airway & Oxygen Therapy: Patient Spontanous Breathing and Patient connected to face mask oxygen  Post-op Assessment: Report given to RN and Post -op Vital signs reviewed and stable  Post vital signs: Reviewed and stable  Last Vitals:  Vitals Value Taken Time  BP 142/96 12/27/19 1122  Temp    Pulse 104 12/27/19 1125  Resp 18 12/27/19 1125  SpO2 100 % 12/27/19 1125  Vitals shown include unvalidated device data.  Last Pain:  Vitals:   12/27/19 0724  TempSrc: Oral  PainSc: 0-No pain      Patients Stated Pain Goal: 5 ([id number])  Complications: No apparent anesthesia complications

## 2019-12-27 NOTE — Progress Notes (Signed)
12/27/2019 1:43 PM Pt. C/o severe urgency and need to void. Attempts x 2 to void unsuccessful. Foley out in PACU. Bladder scan performed showing empty bladder. Dr. Dellis Filbert called and made aware. Verbal order received to encourage hydration and repeat attempt in two hours. If unable to void at that time ok for I&Ocath x1. Pt. Updated on plan of care. Will continue to closely monitor patient.  Logyn Dedominicis, Arville Lime

## 2019-12-28 DIAGNOSIS — D251 Intramural leiomyoma of uterus: Secondary | ICD-10-CM | POA: Diagnosis not present

## 2019-12-28 LAB — CBC
HCT: 39.3 % (ref 36.0–46.0)
Hemoglobin: 13.2 g/dL (ref 12.0–15.0)
MCH: 31.4 pg (ref 26.0–34.0)
MCHC: 33.6 g/dL (ref 30.0–36.0)
MCV: 93.6 fL (ref 80.0–100.0)
Platelets: 302 10*3/uL (ref 150–400)
RBC: 4.2 MIL/uL (ref 3.87–5.11)
RDW: 13.5 % (ref 11.5–15.5)
WBC: 22.7 10*3/uL — ABNORMAL HIGH (ref 4.0–10.5)
nRBC: 0 % (ref 0.0–0.2)

## 2019-12-28 LAB — SURGICAL PATHOLOGY

## 2019-12-28 MED ORDER — OXYCODONE-ACETAMINOPHEN 5-325 MG PO TABS
ORAL_TABLET | ORAL | Status: AC
  Filled 2019-12-28: qty 1

## 2019-12-28 MED ORDER — HYDROMORPHONE HCL 1 MG/ML IJ SOLN
INTRAMUSCULAR | Status: AC
  Filled 2019-12-28: qty 1

## 2019-12-28 MED ORDER — HYDROCODONE-ACETAMINOPHEN 5-325 MG PO TABS
ORAL_TABLET | ORAL | Status: AC
  Filled 2019-12-28: qty 2

## 2019-12-28 MED ORDER — OXYCODONE-ACETAMINOPHEN 7.5-325 MG PO TABS: 1.0000 | ORAL_TABLET | Freq: Four times a day (QID) | ORAL | 0 refills | Status: DC | PRN

## 2019-12-28 NOTE — Progress Notes (Signed)
POD#1 XI Robotic TLH/Bilateral Salpingectomy  Subjective: Patient reports tolerating PO, + flatus and no problems voiding.    Objective: I have reviewed patient's vital signs.  vital signs, intake and output, medications and labs.  Vitals:   12/28/19 0228 12/28/19 0534  BP: 133/88 (!) 101/58  Pulse: 85 85  Resp: 18 16  Temp: 97.9 F (36.6 C) 98.3 F (36.8 C)  SpO2: 98% 97%   I/O last 3 completed shifts: In: [id number] [P.O.:480; I.V.:3570; IV Piggyback:100] Out: 105 [Urine:55; Blood:50] No intake/output data recorded.  Results for orders placed or performed during the hospital encounter of 12/27/19 (from the past 24 hour(s))  Pregnancy, urine POC     Status: None   Collection Time: 12/27/19  8:05 AM  Result Value Ref Range   Preg Test, Ur NEGATIVE NEGATIVE  Glucose, capillary     Status: Abnormal   Collection Time: 12/27/19 11:33 AM  Result Value Ref Range   Glucose-Capillary 119 (H) 70 - 99 mg/dL  CBC     Status: Abnormal   Collection Time: 12/28/19  5:26 AM  Result Value Ref Range   WBC 22.7 (H) 4.0 - 10.5 K/uL   RBC 4.20 3.87 - 5.11 MIL/uL   Hemoglobin 13.2 12.0 - 15.0 g/dL   HCT 39.3 36.0 - 46.0 %   MCV 93.6 80.0 - 100.0 fL   MCH 31.4 26.0 - 34.0 pg   MCHC 33.6 30.0 - 36.0 g/dL   RDW 13.5 11.5 - 15.5 %   Platelets 302 150 - 400 K/uL   nRBC 0.0 0.0 - 0.2 %    EXAM General: alert, cooperative and appears stated age Resp: clear to auscultation bilaterally Cardio: regular rate and rhythm GI: soft, non-tender; bowel sounds normal; no masses,  no organomegaly and incision: clean, dry and intact Extremities: no edema, redness or tenderness in the calves or thighs Vaginal Bleeding: minimal  Assessment: s/p Procedure(s): XI ROBOTIC ASSISTED TOTAL LAPAROSCOPIC HYSTERECTOMY AND SALPINGECTOMY: stable, progressing well and tolerating diet  Plan: Advance diet Encourage ambulation Discharge home  LOS: 0 days    Princess Bruins, MD 12/28/2019 7:56 AM

## 2019-12-28 NOTE — Progress Notes (Signed)
Pt c/o severe lower abd pain (scale of 8) unrelieved by Norco or Percocet.  Pt up in hallway several times during shift. Voiding w/o difficulty.  No flatus. Pt given Dilaudid 0.5mg  IV as ordered.  Will continue to monitor.

## 2019-12-28 NOTE — Discharge Instructions (Signed)

## 2019-12-29 NOTE — Discharge Summary (Signed)
Physician Discharge Summary  Patient ID: Sarah Randall MRN: [id number] DOB/AGE: Sep 17, 1966 54 y.o.  Admit date: 12/27/2019 Discharge date: 12/29/2019  Admission Diagnoses: Postoperative state [Z98.890]   Discharge Diagnoses: Same Active Problems:   Postoperative state   Discharged Condition: good  Consults:None  Significant Diagnostic Studies: labs: Hb Postop 13.2  Treatments: XI Robotic Total Laparoscopic Hysterectomy with Bilateral Salpingectomy  Vitals:   12/28/19 0534 12/28/19 0820  BP: (!) 101/58 133/88  Pulse: 85 (!) 104  Resp: 16 16  Temp: 98.3 F (36.8 C) 98.3 F (36.8 C)  SpO2: 97% 97%     Total I/O In: 4150 [P.O.:480; I.V.:3570; IV Piggyback:100] Out: 105 [Urine:55; Blood:50]   Hospital Course: Good  Discharge Exam: Normal postop  Disposition: Discharge disposition: 01-Home or Self Care       Discharge Instructions    Discharge patient   Complete by: As directed    Discharge disposition: 01-Home or Self Care   Discharge patient date: 12/28/2019       Allergies as of 12/28/2019      Reactions   Lipitor [atorvastatin] Other (See Comments)   Stabbing pains in legs   Zoloft [sertraline Hcl] Other (See Comments)   tremors      Medication List    STOP taking these medications   iron polysaccharides 150 MG capsule Commonly known as: Nu-Iron   megestrol 40 MG tablet Commonly known as: MEGACE   tiZANidine 2 MG tablet Commonly known as: ZANAFLEX   traMADol 50 MG tablet Commonly known as: ULTRAM     TAKE these medications   acetaminophen-codeine 300-30 MG tablet Commonly known as: TYLENOL #3 Take 1 tablet by mouth 3 (three) times daily as needed for moderate pain.   esomeprazole 40 MG capsule Commonly known as: NEXIUM Take 1 capsule (40 mg total) by mouth daily.   hydrochlorothiazide 12.5 MG tablet Commonly known as: HYDRODIURIL Take 1 tablet (12.5 mg total) by mouth daily.   losartan 50 MG tablet Commonly known as:  COZAAR Take 1 tablet (50 mg total) by mouth daily.   nitroGLYCERIN 0.4 MG SL tablet Commonly known as: NITROSTAT Place 1 tablet (0.4 mg total) under the tongue every 5 (five) minutes as needed.   oxyCODONE-acetaminophen 7.5-325 MG tablet Commonly known as: Percocet Take 1 tablet by mouth every 6 (six) hours as needed for severe pain.   pravastatin 20 MG tablet Commonly known as: PRAVACHOL 1 tab Q Mon and Fri What changed:   how much to take  how to take this  when to take this  additional instructions   propranolol 10 MG tablet Commonly known as: INDERAL Take 1 tablet (10 mg total) by mouth 2 (two) times daily.        Follow-up Information    Princess Bruins, MD Follow up in 3 week(s).   Specialty: Obstetrics and Gynecology Contact information: [place] [address] [phone]            Signed: Princess Bruins 12/29/2019, 8:42 PM

## 2020-01-01 ENCOUNTER — Telehealth: Payer: Self-pay | Admitting: Internal Medicine

## 2020-01-01 NOTE — Telephone Encounter (Signed)
Patient called and stated that she has been having  A dry cough and bad congestion for over a month. Patient stated she has tried a variety of things over the counter and it has yet to clear up. Patient requested if pcp could send her something to [facility] on [address] for symptoms. Please fu at your earliest convenience.

## 2020-01-02 NOTE — Telephone Encounter (Signed)
Will forward to pcp

## 2020-01-04 ENCOUNTER — Telehealth (HOSPITAL_BASED_OUTPATIENT_CLINIC_OR_DEPARTMENT_OTHER): Payer: Medicare Other | Admitting: Family

## 2020-01-04 DIAGNOSIS — R0981 Nasal congestion: Secondary | ICD-10-CM | POA: Diagnosis not present

## 2020-01-04 DIAGNOSIS — B349 Viral infection, unspecified: Secondary | ICD-10-CM

## 2020-01-04 DIAGNOSIS — B348 Other viral infections of unspecified site: Secondary | ICD-10-CM

## 2020-01-04 DIAGNOSIS — R05 Cough: Secondary | ICD-10-CM

## 2020-01-04 MED ORDER — LORATADINE 10 MG PO TABS: 10.0000 mg | ORAL_TABLET | Freq: Every day | ORAL | 0 refills | Status: DC

## 2020-01-04 MED ORDER — FLUTICASONE PROPIONATE 50 MCG/ACT NA SUSP: 1.0000 | Freq: Every day | NASAL | 0 refills | Status: DC

## 2020-01-04 MED ORDER — BENZONATATE 100 MG PO CAPS: 100.0000 mg | ORAL_CAPSULE | Freq: Three times a day (TID) | ORAL | 1 refills | Status: DC

## 2020-01-04 NOTE — Progress Notes (Signed)
Virtual Visit via Video Note  I connected with JASSEL MOSCHETTO, on 01/04/2020 at 10:30 AM by telephone due to the COVID-19 pandemic and verified that I am speaking with the correct person using two identifiers.   Consent: I discussed the limitations, risks, security and privacy concerns of performing an evaluation and management service by telephone and the availability of in person appointments. I also discussed with the patient that there may be a patient responsible charge related to this service. The patient expressed understanding and agreed to proceed.   Location of Patient: Home  Location of Provider: Clinic   Persons participating in Telemedicine visit: [place], NP Doloris Hall, CMA   History of Present Illness: 1. NASAL CONGESTION AND DRY COUGH:  Having symptoms of nasal congestion and dry cough for over 1 month. Taking Mucinex, Sudafed, and nasal spray without relief. Nasal saline provides some relief. Nose feels stopped up and drains to the back of the throat with dry cough. Symptoms are worse at night with nighttime awakening. Slight headache a couple of times per week. Left ear had pain on one occasion but then went away. Sneezes occasionally. Denies hoarseness. Denies sore throat.  Denies shortness of breath. Denies chest pain. Denies fever. Denies acid reflux. Denies nausea and vomiting. Denies eyes itching and running. Can blow mucus out sometimes but not much. Nasal saline loosens up mucus and then able to blow it out. Admits cigarettes 5 to 6 per day. Alcohol occasionally.   Past Medical History:  Diagnosis Date  . Anemia   . Anxiety   . Arthritis   . B12 deficiency 06/30/2019  . Depression   . GERD (gastroesophageal reflux disease)    occasionally takes OTC  . Hyperlipidemia   . Hypertension   . Pre-diabetes   . Sleep apnea    tested 2014 - unable to tolerate machine   Allergies  Allergen Reactions  . Lipitor [Atorvastatin] Other (See  Comments)    Stabbing pains in legs  . Zoloft [Sertraline Hcl] Other (See Comments)    tremors    Current Outpatient Medications on File Prior to Visit  Medication Sig Dispense Refill  . acetaminophen-codeine (TYLENOL #3) 300-30 MG tablet Take 1 tablet by mouth 3 (three) times daily as needed for moderate pain. 30 tablet 0  . esomeprazole (NEXIUM) 40 MG capsule Take 1 capsule (40 mg total) by mouth daily. 90 capsule 3  . hydrochlorothiazide (HYDRODIURIL) 12.5 MG tablet Take 1 tablet (12.5 mg total) by mouth daily. 30 tablet 6  . losartan (COZAAR) 50 MG tablet Take 1 tablet (50 mg total) by mouth daily. 90 tablet 3  . nitroGLYCERIN (NITROSTAT) 0.4 MG SL tablet Place 1 tablet (0.4 mg total) under the tongue every 5 (five) minutes as needed. 25 tablet 11  . oxyCODONE-acetaminophen (PERCOCET) 7.5-325 MG tablet Take 1 tablet by mouth every 6 (six) hours as needed for severe pain. 20 tablet 0  . pravastatin (PRAVACHOL) 20 MG tablet 1 tab Q Mon and Fri (Patient taking differently: Take 20 mg by mouth 2 (two) times a week. ) 90 tablet 3  . propranolol (INDERAL) 10 MG tablet Take 1 tablet (10 mg total) by mouth 2 (two) times daily. 180 tablet 3   No current facility-administered medications on file prior to visit.    Observations/Objective: Alert and oriented x 3. Patient not in acute distress. Physical examination not completed as this is telemedicine visit.  Assessment and Plan: 1. Rhinovirus: - fluticasone (FLONASE) 50  MCG/ACT nasal spray; Place 1 spray into both nostrils daily. Place 1 spray into both nostrils daily as needed.  Dispense: 16 g; Refill: 0 - benzonatate (TESSALON) 100 MG capsule; Take 1 capsule (100 mg total) by mouth 3 (three) times daily.  Dispense: 45 capsule; Refill: 1 - loratadine (CLARITIN) 10 MG tablet; Take 1 tablet (10 mg total) by mouth daily.  Dispense: 30 tablet; Refill: 0   Follow Up Instructions: Follow-up with attending physician is symptoms do not improve or  worsen.    I discussed the assessment and treatment plan with the patient. The patient was provided an opportunity to ask questions and all were answered. The patient agreed with the plan and demonstrated an understanding of the instructions. The patient was advised to call back or seek an in-person evaluation if the symptoms worsen or if the condition fails to improve as anticipated. Patient was given clear instructions to go to Emergency Department or return to medical center if symptoms don't improve, worsen, or new problems develop.The patient verbalized understanding.   I provided 20 minutes total of non-face-to-face time during this encounter including median intraservice time, reviewing previous notes, labs, imaging, medications, management and patient verbalized understanding.    Camillia Herter, NP  [facility] [place], [place]   01/04/2020, 10:30 AM

## 2020-01-07 ENCOUNTER — Other Ambulatory Visit: Payer: Self-pay | Admitting: Obstetrics & Gynecology

## 2020-01-07 DIAGNOSIS — T8149XA Infection following a procedure, other surgical site, initial encounter: Secondary | ICD-10-CM

## 2020-01-07 MED ORDER — AMOXICILLIN-POT CLAVULANATE 875-125 MG PO TABS: 1.0000 | ORAL_TABLET | Freq: Two times a day (BID) | ORAL | 0 refills | Status: AC

## 2020-01-19 ENCOUNTER — Other Ambulatory Visit: Payer: Self-pay

## 2020-01-22 ENCOUNTER — Encounter: Payer: Self-pay | Admitting: Obstetrics & Gynecology

## 2020-01-22 ENCOUNTER — Ambulatory Visit (INDEPENDENT_AMBULATORY_CARE_PROVIDER_SITE_OTHER): Payer: Medicare Other | Admitting: Obstetrics & Gynecology

## 2020-01-22 ENCOUNTER — Other Ambulatory Visit: Payer: Self-pay

## 2020-01-22 VITALS — BP 132/88

## 2020-01-22 DIAGNOSIS — Z09 Encounter for follow-up examination after completed treatment for conditions other than malignant neoplasm: Secondary | ICD-10-CM

## 2020-01-22 DIAGNOSIS — T8149XA Infection following a procedure, other surgical site, initial encounter: Secondary | ICD-10-CM

## 2020-01-22 NOTE — Progress Notes (Signed)
    Sarah Randall March 28, 1966 [id number]        54 y.o.  G2P0012   RP: Post op XI Robotic TLH/Bilateral Salpingectomy 12/27/2019  HPI: Good postop evolution.  Patient calls about a week after surgery with drainage at the supraumbilical incision.  Started on Keflex for 10 days.  Using peroxide daily to clean that incision.  No more pain or drainage at that level.  No fever.  No vaginal bleeding or vaginal discharge.  No pelvic pain.   OB History  Gravida Para Term Preterm AB Living  2 0 0 0 1 2  SAB TAB Ectopic Multiple Live Births  1 0 0 0 0    # Outcome Date GA Lbr Len/2nd Weight Sex Delivery Anes PTL Lv  2 Gravida           1 SAB             Past medical history,surgical history, problem list, medications, allergies, family history and social history were all reviewed and documented in the EPIC chart.   Directed ROS with pertinent positives and negatives documented in the history of present illness/assessment and plan.  Exam:  Vitals:   01/22/20 1033  BP: 132/88   General appearance:  Normal  Abdomen: Soft, non-tender.  Incisions healing well.  Umbilical incision improved per patient.  No longer draining.  Skin closed.  No erythema.  Minor induration.  Gynecologic exam: Vulva normal.  Speculum:  Vaginal vault well healed, closed.  Normal vaginal secretions.  No bleeding.  Patho 12/27/2019: FINAL MICROSCOPIC DIAGNOSIS:   A. UTERUS, CERVIX, BILATERAL FALLOPIAN TUBES:  - Uterus:    Endometrium: Inactive endometrium. No hyperplasia or malignancy.    Myometrium: Leiomyomata. No malignancy.    Serosa: Unremarkable. No malignancy.  - Cervix: Benign squamous and endocervical mucosa. No dysplasia or  malignancy.  - Bilateral fallopian tubes: Unremarkable. No malignancy.    Assessment/Plan:  54 y.o. G2P0012   1. Status post gynecological surgery, follow-up exam Good postop healing with infection of supraumbilical incision well-healed post spontaneous drainage and  Keflex x10 days.  No other complication.  Pathology benign with fibroids.  Precautions discussed.  No intercourse until 7 to 8 weeks postop.  Follow-up in 4 weeks for evaluation of the vaginal vault.  2. Wound infection after surgery Infection of the umbilical incision with drainage 2 weeks ago.  Patient was treated with Keflex for 10 days.  Resolved infection currently.  We will continue to keep clean with peroxide once a day.  Princess Bruins MD, 10:48 AM 01/22/2020

## 2020-01-22 NOTE — Patient Instructions (Signed)
1. Status post gynecological surgery, follow-up exam Good postop healing with infection of supraumbilical incision well-healed post spontaneous drainage and Keflex x10 days.  No other complication.  Pathology benign with fibroids.  Precautions discussed.  No intercourse until 7 to 8 weeks postop.  Follow-up in 4 weeks for evaluation of the vaginal vault.  2. Wound infection after surgery Infection of the umbilical incision with drainage 2 weeks ago.  Patient was treated with Keflex for 10 days.  Resolved infection currently.  We will continue to keep clean with peroxide once a day.  Sarah Randall, it was a pleasure seeing you today!

## 2020-01-26 ENCOUNTER — Other Ambulatory Visit: Payer: Self-pay | Admitting: Family

## 2020-01-26 DIAGNOSIS — B348 Other viral infections of unspecified site: Secondary | ICD-10-CM

## 2020-02-07 ENCOUNTER — Encounter: Payer: Medicare Other | Admitting: Physical Medicine and Rehabilitation

## 2020-02-19 ENCOUNTER — Other Ambulatory Visit: Payer: Self-pay

## 2020-02-20 ENCOUNTER — Encounter: Payer: Self-pay | Admitting: Obstetrics & Gynecology

## 2020-02-20 ENCOUNTER — Ambulatory Visit (INDEPENDENT_AMBULATORY_CARE_PROVIDER_SITE_OTHER): Payer: Medicare Other | Admitting: Obstetrics & Gynecology

## 2020-02-20 VITALS — BP 128/84

## 2020-02-20 DIAGNOSIS — Z09 Encounter for follow-up examination after completed treatment for conditions other than malignant neoplasm: Secondary | ICD-10-CM

## 2020-02-20 NOTE — Patient Instructions (Signed)
1. Status post gynecological surgery, follow-up exam Excellent postop healing.  No complication.  Can resume all activities including sexual activities.  Precautions reviewed.  Sarah Randall, it was a pleasure seeing you today!

## 2020-02-20 NOTE — Progress Notes (Signed)
    Sarah Randall 10/08/1966 [id number]        54 y.o.  G2P0012 Married  RP: Postop XI Robotic TLH/Bilateral Salpingectomy 12/27/2019  HPI: Very good postop healing after antibiotics for an infection at one of the incision site.  Incisions all closed and nontender now.  No abdominal pelvic pain.  No vaginal bleeding.  No fever.  Urine and bowel movements normal.   OB History  Gravida Para Term Preterm AB Living  2 0 0 0 1 2  SAB TAB Ectopic Multiple Live Births  1 0 0 0 0    # Outcome Date GA Lbr Len/2nd Weight Sex Delivery Anes PTL Lv  2 Gravida           1 SAB             Past medical history,surgical history, problem list, medications, allergies, family history and social history were all reviewed and documented in the EPIC chart.   Directed ROS with pertinent positives and negatives documented in the history of present illness/assessment and plan.  Exam:  Vitals:   02/20/20 1452  BP: 128/84   General appearance:  Normal  Abdomen: Incisions well healed.    Gynecologic exam: Vulva normal.  Speculum:  Vaginal vault well healed.  Normal secretions.  No bleeding.  Bimanual exam:    Patho: FINAL MICROSCOPIC DIAGNOSIS:   A. UTERUS, CERVIX, BILATERAL FALLOPIAN TUBES:  - Uterus:    Endometrium: Inactive endometrium. No hyperplasia or malignancy.    Myometrium: Leiomyomata. No malignancy.    Serosa: Unremarkable. No malignancy.  - Cervix: Benign squamous and endocervical mucosa. No dysplasia or  malignancy.  - Bilateral fallopian tubes: Unremarkable. No malignancy.   Assessment/Plan:  54 y.o. G2P0012   1. Status post gynecological surgery, follow-up exam Excellent postop healing.  No complication.  Can resume all activities including sexual activities.  Precautions reviewed.  Follow-up annual gynecologic exam in 1 year.  Princess Bruins MD, 3:04 PM 02/20/2020

## 2020-02-26 ENCOUNTER — Other Ambulatory Visit: Payer: Self-pay | Admitting: Internal Medicine

## 2020-02-27 ENCOUNTER — Encounter: Payer: Medicare Other | Admitting: Physical Medicine and Rehabilitation

## 2020-03-09 ENCOUNTER — Other Ambulatory Visit: Payer: Self-pay

## 2020-03-09 ENCOUNTER — Encounter (HOSPITAL_BASED_OUTPATIENT_CLINIC_OR_DEPARTMENT_OTHER): Payer: Self-pay

## 2020-03-09 ENCOUNTER — Emergency Department (HOSPITAL_BASED_OUTPATIENT_CLINIC_OR_DEPARTMENT_OTHER)
Admission: EM | Admit: 2020-03-09 | Discharge: 2020-03-09 | Disposition: A | Discharge status: Home / Self Care | Payer: Medicare Other | Attending: Emergency Medicine | Admitting: Emergency Medicine

## 2020-03-09 ENCOUNTER — Emergency Department (HOSPITAL_BASED_OUTPATIENT_CLINIC_OR_DEPARTMENT_OTHER): Payer: Medicare Other

## 2020-03-09 DIAGNOSIS — F17210 Nicotine dependence, cigarettes, uncomplicated: Secondary | ICD-10-CM | POA: Diagnosis not present

## 2020-03-09 DIAGNOSIS — M542 Cervicalgia: Secondary | ICD-10-CM | POA: Diagnosis present

## 2020-03-09 DIAGNOSIS — M5412 Radiculopathy, cervical region: Secondary | ICD-10-CM | POA: Diagnosis not present

## 2020-03-09 DIAGNOSIS — I10 Essential (primary) hypertension: Secondary | ICD-10-CM | POA: Insufficient documentation

## 2020-03-09 DIAGNOSIS — Z79899 Other long term (current) drug therapy: Secondary | ICD-10-CM | POA: Diagnosis not present

## 2020-03-09 IMAGING — DX DG CERVICAL SPINE COMPLETE 4+V
6 series · 6 of 6 positions shown · non-contrast
Comparison: None.

CLINICAL DATA: Left-sided neck pain with radiation to the left
shoulder for 1 week. No injury.

EXAM:
CERVICAL SPINE - COMPLETE 4+ VIEW

[c-spine lat]
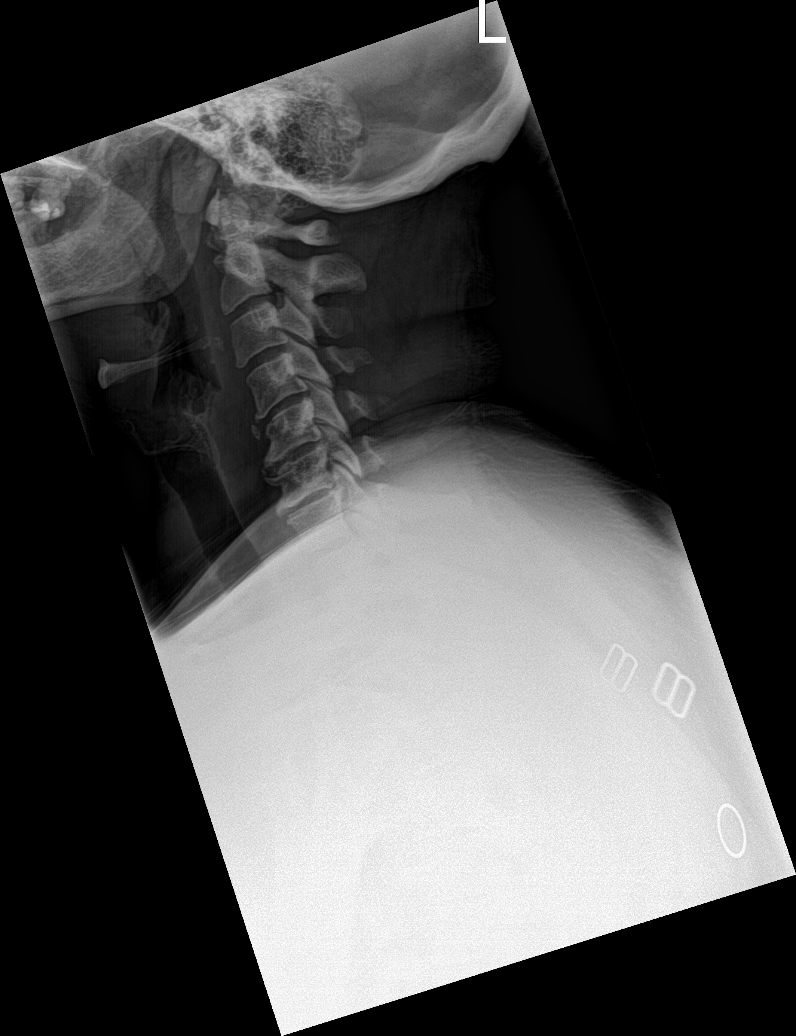

[c-spine obl (1 of 2)]
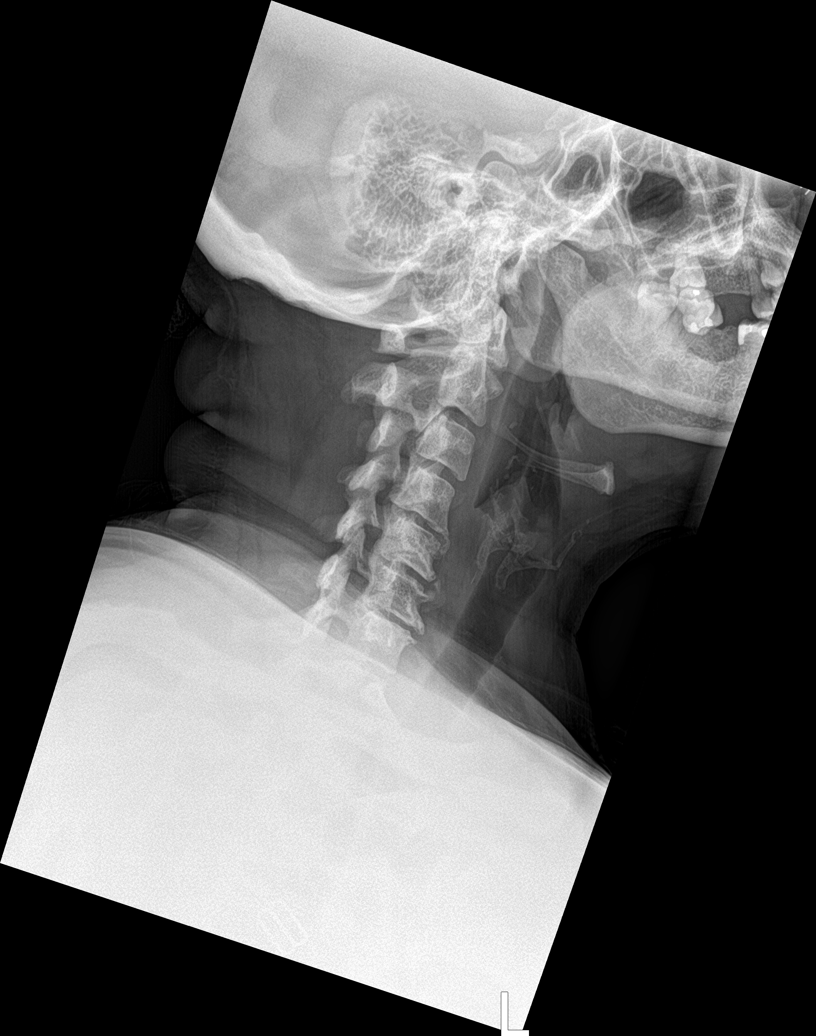

[c-spine obl (2 of 2)]
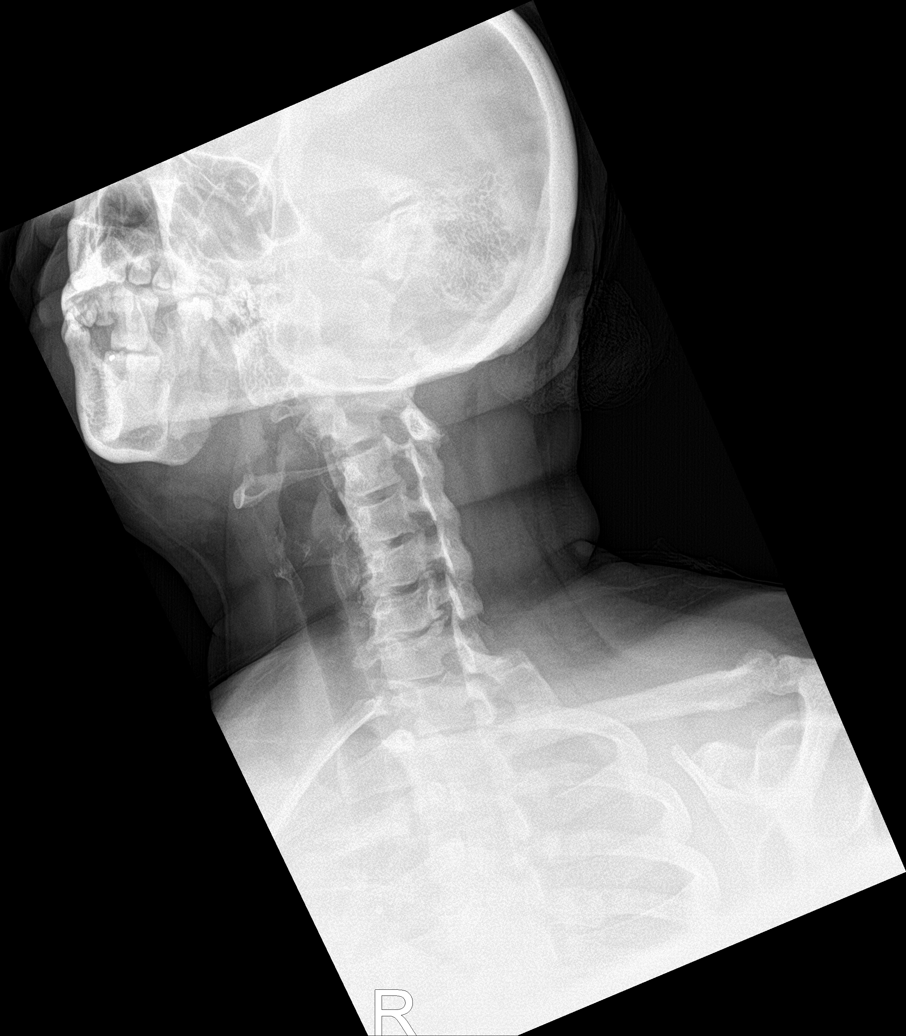

[c-spine ap]
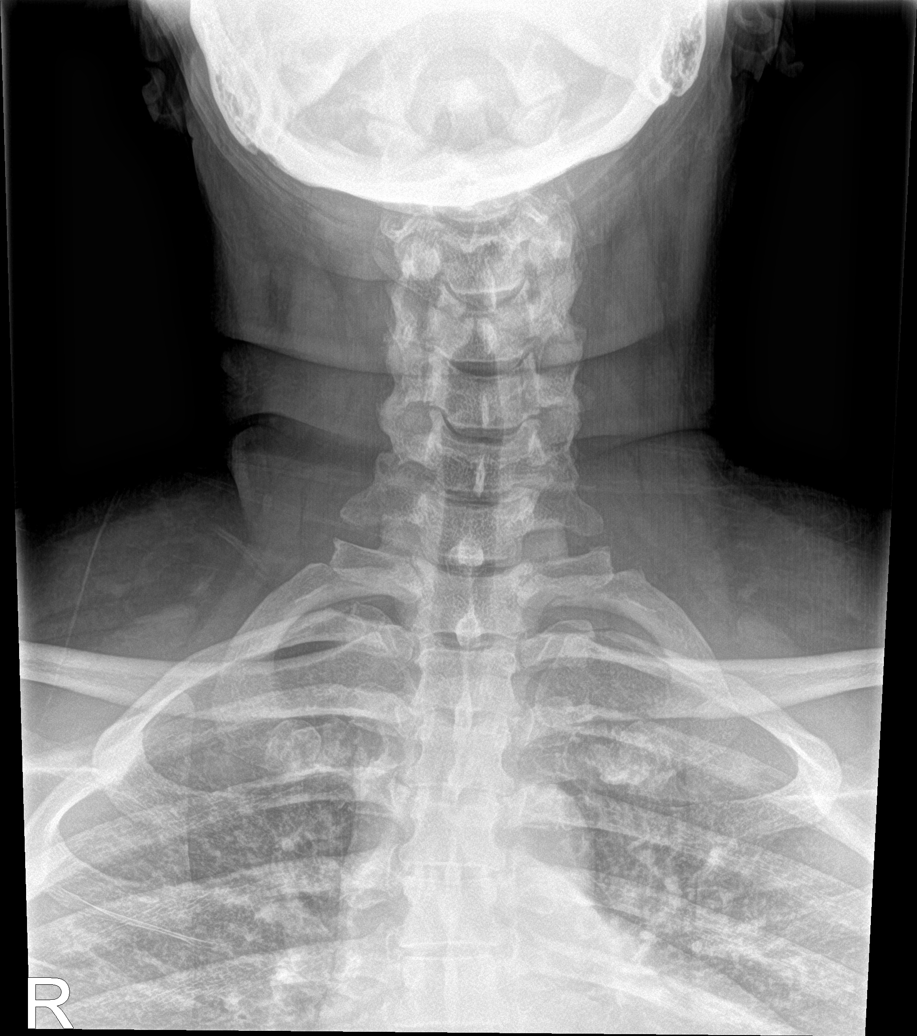

[c-spine open mouth]
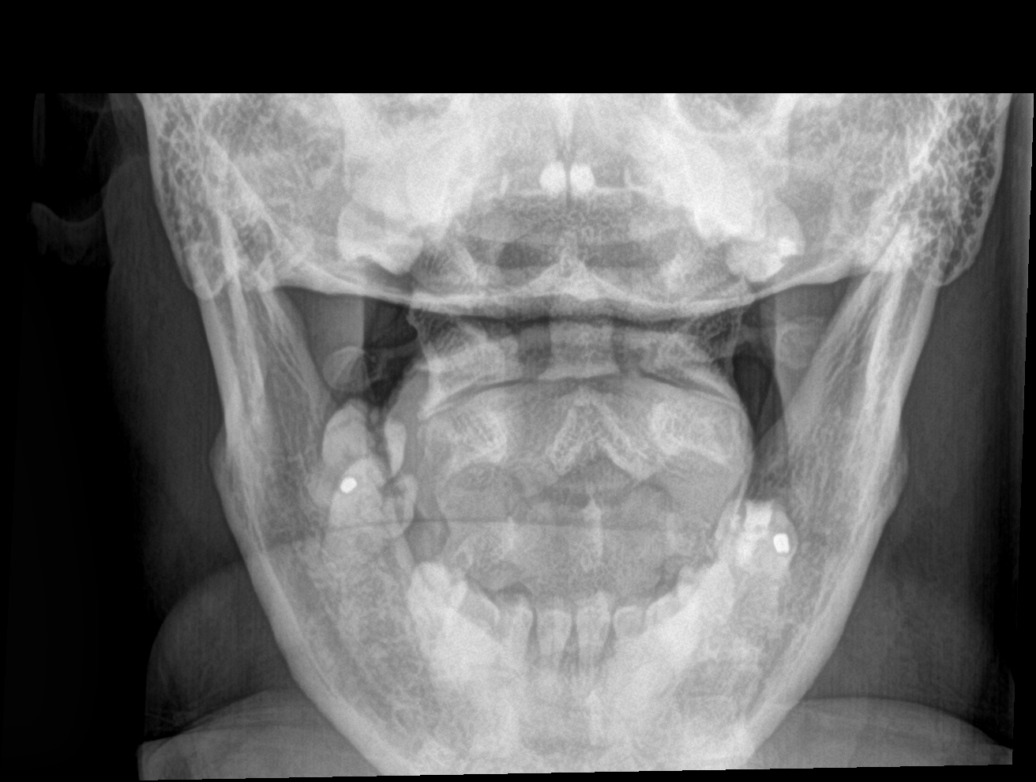

[c-spine swimmers]
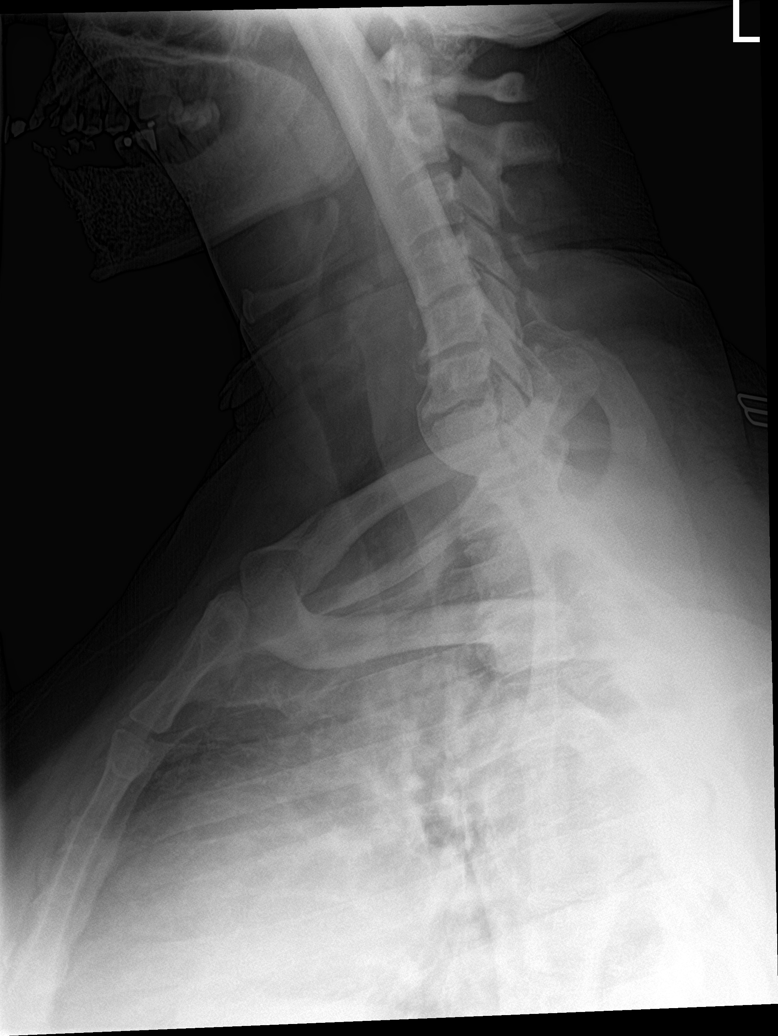

[6 of 6 positions shown; findings below may reference images not displayed]

FINDINGS: Straightened cervical lordosis.  No spondylolisthesis.

No fracture or bone lesion.

Mild loss of disc height at C4-C5, C5-C6 and C6-C7. Endplate
spurring at these levels.

Uncovertebral spurring causes moderate neural foraminal narrowing on
the left at C6-C7, with mild narrowing at C5-C6. Right neural
foramina are not well evaluated.

Soft tissues are unremarkable.
IMPRESSION: 1. No fracture or acute finding.
2. Degenerative changes as detailed. Moderate left C6-C7 neural
foraminal narrowing from uncovertebral spurring.

## 2020-03-09 MED ORDER — CYCLOBENZAPRINE HCL 10 MG PO TABS: 10.0000 mg | ORAL_TABLET | Freq: Two times a day (BID) | ORAL | 0 refills | Status: DC | PRN

## 2020-03-09 MED ORDER — PREDNISONE 50 MG PO TABS
60.0000 mg | ORAL_TABLET | Freq: Once | ORAL | Status: AC
  Administered 2020-03-09: 60 mg via ORAL
  Filled 2020-03-09: qty 1

## 2020-03-09 MED ORDER — PREDNISONE 20 MG PO TABS: 20.0000 mg | ORAL_TABLET | Freq: Two times a day (BID) | ORAL | 0 refills | Status: AC

## 2020-03-09 NOTE — Discharge Instructions (Addendum)
You have been seen today for neck pain. Please read and follow all provided instructions. Return to the emergency room for worsening condition or new concerning symptoms.    1. Medications:  Prescription sent to your pharmacy for prednisone.  Please take this as prescribed.  We did a steroid to help with inflammation and pain. -Prescription also sent for Flexeril.  This a muscle relaxer.  Also use to help with pain.  This medicine can make you drowsy so do not drive or work when taking it.  Continue usual home medications Take medications as prescribed. Please review all of the medicines and only take them if you do not have an allergy to them.   2. Treatment: rest, drink plenty of fluids  3. Follow Up:  Please follow up with primary care provider by scheduling an appointment as soon as possible for a visit  -Also recommend you discuss your neck pain with the orthopedist at your next visit.   It is also a possibility that you have an allergic reaction to any of the medicines that you have been prescribed - Everybody reacts differently to medications and while MOST people have no trouble with most medicines, you may have a reaction such as nausea, vomiting, rash, swelling, shortness of breath. If this is the case, please stop taking the medicine immediately and contact your physician.  ?

## 2020-03-09 NOTE — ED Triage Notes (Signed)
Pt arrives ambulatory with neck pain X1 week states that she woke up with pain, denies injury, has been using ice, heat,and creams with no relief.

## 2020-03-09 NOTE — ED Provider Notes (Signed)
[facility] EMERGENCY DEPARTMENT Provider Note   CSN: [id number] Arrival date & time: 03/09/20  1427     History Chief Complaint  Patient presents with  . Neck Pain    Sarah Randall is a 54 y.o. female with past medical history significant for hypertension, prediabetes, hyperlipidemia, arthritis presents to emergency department today with chief complaint of progressively worsening left-sided neck pain x1 week.  Patient states when the symptoms were started she thought she had slept wrong.  She is describing a sharp and shooting pain on the left side of her neck that radiates to her left shoulder.  She states the pain was intermittent at first however has become constant times the last 3 days.  She rates the pain 10 of 10 in severity.  She has been using ice, heating pad, over-the-counter pain relief creams without any symptom relief.  She denies any recent injury, trauma or fall. She denies fever, chills, shortness of breath, chest pain, numbness, weakness, tingling, swelling, wound.  History provided by patient with additional history obtained from chart review.      Past Medical History:  Diagnosis Date  . Anemia   . Anxiety   . Arthritis   . B12 deficiency 06/30/2019  . Depression   . GERD (gastroesophageal reflux disease)    occasionally takes OTC  . Hyperlipidemia   . Hypertension   . Pre-diabetes   . Sleep apnea    tested 2014 - unable to tolerate machine    Patient Active Problem List   Diagnosis Date Noted  . Postoperative state 12/27/2019  . Iron deficiency 08/03/2019  . B12 deficiency 06/30/2019  . Chronic right shoulder pain 07/05/2018  . Trochanteric bursitis of left hip 07/05/2018  . Morbid obesity ([place]) 07/05/2018  . Body mass index 45.0-49.9, adult ([place]) 07/05/2018  . Marijuana user 05/26/2018  . Status post total right knee replacement 01/12/2018  . Status post total hip replacement, left 09/22/2017  . Hyperlipidemia 08/10/2017  . Primary  osteoarthritis of left hip 06/25/2017  . Primary osteoarthritis of right knee 06/25/2017  . Mild depression ([place]) 06/25/2017  . Essential hypertension 06/25/2017  . Tobacco abuse 06/25/2017  . Prediabetes 06/25/2017  . OSA on CPAP 06/25/2017  . Bronchitis 09/24/2016  . Leukocytosis 09/24/2016  . Radiculopathy, lumbar region 07/16/2016  . Degenerative lumbar disc 07/03/2016  . Synovitis of hip 04/20/2016  . Screening for breast cancer 12/28/2014  . Pain in joint involving lower leg 05/13/2013    Past Surgical History:  Procedure Laterality Date  . LEFT HEART CATH AND CORONARY ANGIOGRAPHY N/A 10/25/2018   Procedure: LEFT HEART CATH AND CORONARY ANGIOGRAPHY;  Surgeon: Martinique, Peter M, MD;  Location: [facility];  Service: Cardiovascular;  Laterality: N/A;  . ROBOTIC ASSISTED LAPAROSCOPIC HYSTERECTOMY AND SALPINGECTOMY Bilateral 12/27/2019   Procedure: XI ROBOTIC ASSISTED TOTAL LAPAROSCOPIC HYSTERECTOMY AND SALPINGECTOMY;  Surgeon: Princess Bruins, MD;  Location: [facility];  Service: Gynecology;  Laterality: Bilateral;  . TOTAL HIP ARTHROPLASTY Left 09/22/2017   Procedure: LEFT TOTAL HIP ARTHROPLASTY ANTERIOR APPROACH;  Surgeon: Leandrew Koyanagi, MD;  Location: [facility];  Service: Orthopedics;  Laterality: Left;  . TOTAL KNEE ARTHROPLASTY Right 01/12/2018   Procedure: RIGHT TOTAL KNEE ARTHROPLASTY;  Surgeon: Leandrew Koyanagi, MD;  Location: [facility];  Service: Orthopedics;  Laterality: Right;  . TUBAL LIGATION       OB History    Gravida  2   Para  0   Term  0  Preterm  0   AB  1   Living  2     SAB  1   TAB  0   Ectopic  0   Multiple  0   Live Births  0           Family History  Problem Relation Age of Onset  . Hypertension Mother   . Diabetes Mother   . Breast cancer Mother 78  . Hypertension Father   . Lung cancer Maternal Aunt   . Colon polyps Brother   . Colon cancer Neg Hx   . Esophageal cancer Neg Hx   . Rectal cancer Neg Hx   .  Stomach cancer Neg Hx     Social History   Tobacco Use  . Smoking status: Current Some Day Smoker    Packs/day: 0.25    Years: 30.00    Pack years: 7.50    Types: Cigarettes  . Smokeless tobacco: Never Used  Substance Use Topics  . Alcohol use: Yes    Alcohol/week: 4.0 - 6.0 standard drinks    Types: 4 - 6 Shots of liquor per week    Comment: occasional  . Drug use: Yes    Types: Marijuana    Comment: smoke 1 joint cannibis 1 week ago.    Home Medications Prior to Admission medications   Medication Sig Start Date End Date Taking? Authorizing Provider  acetaminophen-codeine (TYLENOL #3) 300-30 MG tablet Take 1 tablet by mouth 3 (three) times daily as needed for moderate pain. 12/26/19   Aundra Dubin, PA-C  cyclobenzaprine (FLEXERIL) 10 MG tablet Take 1 tablet (10 mg total) by mouth 2 (two) times daily as needed for muscle spasms. 03/09/20   Skarlette Lattner E, PA-C  esomeprazole (NEXIUM) 40 MG capsule Take 1 capsule (40 mg total) by mouth daily. 08/03/19   Ladell Pier, MD  fluticasone (FLONASE) 50 MCG/ACT nasal spray Place 1 spray into both nostrils daily. Place 1 spray into both nostrils daily as needed. 01/04/20   Camillia Herter, NP  hydrochlorothiazide (HYDRODIURIL) 12.5 MG tablet TAKE 1 TABLET BY MOUTH EVERY DAY 02/26/20   Ladell Pier, MD  loratadine (CLARITIN) 10 MG tablet TAKE 1 TABLET BY MOUTH EVERY DAY 01/26/20   Ladell Pier, MD  losartan (COZAAR) 50 MG tablet Take 1 tablet (50 mg total) by mouth daily. 08/03/19   Ladell Pier, MD  nitroGLYCERIN (NITROSTAT) 0.4 MG SL tablet Place 1 tablet (0.4 mg total) under the tongue every 5 (five) minutes as needed. 10/21/18 12/22/19  Revankar, Reita Cliche, MD  pravastatin (PRAVACHOL) 20 MG tablet 1 tab Q Mon and Fri Patient taking differently: Take 20 mg by mouth 2 (two) times a week.  08/03/19   Ladell Pier, MD  predniSONE (DELTASONE) 20 MG tablet Take 1 tablet (20 mg total) by mouth in the morning and at  bedtime for 4 days. 03/10/20 03/14/20  Shoshannah Faubert E, PA-C  propranolol (INDERAL) 10 MG tablet Take 1 tablet (10 mg total) by mouth 2 (two) times daily. 08/03/19   Ladell Pier, MD    Allergies    Lipitor [atorvastatin] and Zoloft [sertraline hcl]  Review of Systems   Review of Systems  All other systems are reviewed and are negative for acute change except as noted in the HPI.  Physical Exam Updated Vital Signs BP (!) 127/93 (BP Location: Right Arm)   Pulse 88   Temp 98.4 F (36.9 C) (Oral)   Resp  20   Ht 5\' 4"  (1.626 m)   Wt 119.7 kg   LMP  (LMP Unknown)   SpO2 99%   BMI 45.32 kg/m   Physical Exam Vitals and nursing note reviewed.  Constitutional:      Appearance: She is well-developed. She is not ill-appearing or toxic-appearing.  HENT:     Head: Normocephalic and atraumatic.     Nose: Nose normal.  Eyes:     General: No scleral icterus.       Right eye: No discharge.        Left eye: No discharge.     Conjunctiva/sclera: Conjunctivae normal.  Neck:     Vascular: No JVD.      Comments: Noticed to palpation as depicted in image above.  No overlying skin changes.  No spasms.  No midline cervical spine tenderness.  No step-off or deformity. Cardiovascular:     Rate and Rhythm: Normal rate and regular rhythm.     Pulses: Normal pulses.          Radial pulses are 2+ on the right side and 2+ on the left side.     Heart sounds: Normal heart sounds.  Pulmonary:     Effort: Pulmonary effort is normal.     Breath sounds: Normal breath sounds.  Abdominal:     General: There is no distension.  Musculoskeletal:        General: Normal range of motion.     Cervical back: Normal range of motion.     Comments: No tenderness to palpation of left shoulder.  Full range of motion.  Negative empty can test, Neer's test, liftoff.  No swelling, erythema or ecchymosis present.  No step-off, crepitus or deformity appreciated.  5/5 muscle strength of bilateral upper extremities,  sensation intact and all compartments soft.  Full ROM of left elbow and wrist  Skin:    General: Skin is warm and dry.  Neurological:     Mental Status: She is oriented to person, place, and time.     GCS: GCS eye subscore is 4. GCS verbal subscore is 5. GCS motor subscore is 6.     Comments: Fluent speech, no facial droop.  Psychiatric:        Behavior: Behavior normal.       ED Results / Procedures / Treatments   Labs (all labs ordered are listed, but only abnormal results are displayed) Labs Reviewed - No data to display  EKG None  Radiology DG Cervical Spine Complete  Result Date: 03/09/2020 CLINICAL DATA:  Left-sided neck pain with radiation to the left shoulder for 1 week. No injury. EXAM: CERVICAL SPINE - COMPLETE 4+ VIEW COMPARISON:  None. FINDINGS: Straightened cervical lordosis.  No spondylolisthesis. No fracture or bone lesion. Mild loss of disc height at C4-C5, C5-C6 and C6-C7. Endplate spurring at these levels. Uncovertebral spurring causes moderate neural foraminal narrowing on the left at C6-C7, with mild narrowing at C5-C6. Right neural foramina are not well evaluated. Soft tissues are unremarkable. IMPRESSION: 1. No fracture or acute finding. 2. Degenerative changes as detailed. Moderate left C6-C7 neural foraminal narrowing from uncovertebral spurring. Electronically Signed   By: Lajean Manes M.D.   On: 03/09/2020 16:32    Procedures Procedures (including critical care time)  Medications Ordered in ED Medications  predniSONE (DELTASONE) tablet 60 mg (60 mg Oral Given 03/09/20 1602)    ED Course  I have reviewed the triage vital signs and the nursing notes.  Pertinent labs &  imaging results that were available during my care of the patient were reviewed by me and considered in my medical decision making (see chart for details).    MDM Rules/Calculators/A&P                      Patient seen and examined. Patient presents awake, alert, hemodynamically  stable, afebrile, non toxic.  Patient is well-appearing, no acute distress.  On exam she has left-sided cervical paraspinal muscle tenderness.  No spasms felt.  Full range of motion of neck and left upper extremity. NVI distally.  Radial pulse 2+ bilaterally.  No thoracic or lumbar spine tenderness.  X-ray of cervical spine viewed by me shows no acute findings.  Does appear to be degenerative changes.  Patient's symptoms and exam are suggestive of cervical radiculopathy, feel that ACS is very unlikely etiology.  Will treat with short burst of prednisone and muscle relaxer.  Patient is scheduled to see her orthopedist on 03/25/2020 for a steroid injection for her hip, will avoid steroid taper at this time.  She is aware not to drive or work while taking muscle x-ray as it can make her drowsy.  The patient appears reasonably screened and/or stabilized for discharge and I doubt any other medical condition or other [facility] requiring further screening, evaluation, or treatment in the ED at this time prior to discharge. The patient is safe for discharge with strict return precautions discussed. Recommend pcp follow up.  Portions of this note were generated with [employer]. Dictation errors may occur despite best attempts at proofreading.   Final Clinical Impression(s) / ED Diagnoses Final diagnoses:  Cervical radiculopathy    Rx / DC Orders ED Discharge Orders         Ordered    predniSONE (DELTASONE) 20 MG tablet  2 times daily     03/09/20 1644    cyclobenzaprine (FLEXERIL) 10 MG tablet  2 times daily PRN     03/09/20 1644           Benjimen Kelley, Harley Hallmark, PA-C 03/09/20 1711    Hayden Rasmussen, MD 03/10/20 1032

## 2020-03-25 ENCOUNTER — Ambulatory Visit (INDEPENDENT_AMBULATORY_CARE_PROVIDER_SITE_OTHER): Payer: Medicare Other | Admitting: Physical Medicine and Rehabilitation

## 2020-03-25 ENCOUNTER — Encounter: Payer: Self-pay | Admitting: Physical Medicine and Rehabilitation

## 2020-03-25 ENCOUNTER — Ambulatory Visit: Payer: Self-pay

## 2020-03-25 ENCOUNTER — Other Ambulatory Visit: Payer: Self-pay

## 2020-03-25 VITALS — BP 149/103 | HR 99

## 2020-03-25 DIAGNOSIS — M5416 Radiculopathy, lumbar region: Secondary | ICD-10-CM | POA: Diagnosis not present

## 2020-03-25 MED ORDER — METHYLPREDNISOLONE ACETATE 80 MG/ML IJ SUSP
40.0000 mg | Freq: Once | INTRAMUSCULAR | Status: AC
  Administered 2020-03-25: 40 mg

## 2020-03-25 NOTE — Procedures (Signed)
Lumbar Epidural Steroid Injection - Interlaminar Approach with Fluoroscopic Guidance  Patient: Sarah Randall      Date of Birth: 28-Aug-1966 MRN: [id number] PCP: Ladell Pier, MD      Visit Date: 03/25/2020   Universal Protocol:     Consent Given By: the patient  Position: PRONE  Additional Comments: Vital signs were monitored before and after the procedure. Patient was prepped and draped in the usual sterile fashion. The correct patient, procedure, and site was verified.   Injection Procedure Details:  Procedure Site One Meds Administered:  Meds ordered this encounter  Medications  . methylPREDNISolone acetate (DEPO-MEDROL) injection 40 mg     Laterality: Left  Location/Site:  L4-L5  Needle size: 20 G  Needle type: Tuohy  Needle Placement: Paramedian epidural  Findings:   -Comments: Excellent flow of contrast into the epidural space.  Procedure Details: Using a paramedian approach from the side mentioned above, the region overlying the inferior lamina was localized under fluoroscopic visualization and the soft tissues overlying this structure were infiltrated with 4 ml. of 1% Lidocaine without Epinephrine. The Tuohy needle was inserted into the epidural space using a paramedian approach.   The epidural space was localized using loss of resistance along with lateral and bi-planar fluoroscopic views.  After negative aspirate for air, blood, and CSF, a 2 ml. volume of Isovue-250 was injected into the epidural space and the flow of contrast was observed. Radiographs were obtained for documentation purposes.    The injectate was administered into the level noted above.   Additional Comments:  The patient tolerated the procedure well Dressing: 2 x 2 sterile gauze and Band-Aid    Post-procedure details: Patient was observed during the procedure. Post-procedure instructions were reviewed.  Patient left the clinic in stable condition.

## 2020-03-25 NOTE — Progress Notes (Signed)
Sarah Randall - 54 y.o. female MRN [id number]  Date of birth: [birth year]  Office Visit Note: Visit Date: 03/25/2020 PCP: Ladell Pier, MD Referred by: Ladell Pier, MD  Subjective: Chief Complaint  Patient presents with  . Lower Back - Pain  . Left Thigh - Pain   HPI:  Sarah Randall is a 54 y.o. female who comes in today For lumbar epidural injection at the request of Dr. Eduard Roux.  Interestingly she last saw Dr. Erlinda Hong in January.  There were several attempts at doing the injection for her and she has had to reschedule.  She recently was seen in the emergency room for cervical pain and radicular type pain.  She is having low back pain with some referral in the left thigh.  Is worse with standing and walking better at rest and sitting.  Pain is 8 out of 10 nothing seems to help.  She has failed conservative care with medication treatment and time and home exercise.  We will complete L4-5 interlaminar injection today.  MRI was reviewed is reviewed below.  In terms of her neck pain she can follow-up with me or Dr. Erlinda Hong.  She may want to follow-up with him as he can probably see her faster.  If there is anything we can do for treatment we would be happy to see her.  ROS Otherwise per HPI.  Assessment & Plan: Visit Diagnoses:  1. Lumbar radiculopathy     Plan: No additional findings.   Meds & Orders:  Meds ordered this encounter  Medications  . methylPREDNISolone acetate (DEPO-MEDROL) injection 40 mg    Orders Placed This Encounter  Procedures  . XR C-ARM NO REPORT  . Epidural Steroid injection    Follow-up: Return for visit to requesting physician as needed.   Procedures: No procedures performed  Lumbar Epidural Steroid Injection - Interlaminar Approach with Fluoroscopic Guidance  Patient: Sarah Randall      Date of Birth: 1966-09-01 MRN: [id number] PCP: Ladell Pier, MD      Visit Date: 03/25/2020   Universal Protocol:     Consent Given By: the  patient  Position: PRONE  Additional Comments: Vital signs were monitored before and after the procedure. Patient was prepped and draped in the usual sterile fashion. The correct patient, procedure, and site was verified.   Injection Procedure Details:  Procedure Site One Meds Administered:  Meds ordered this encounter  Medications  . methylPREDNISolone acetate (DEPO-MEDROL) injection 40 mg     Laterality: Left  Location/Site:  L4-L5  Needle size: 20 G  Needle type: Tuohy  Needle Placement: Paramedian epidural  Findings:   -Comments: Excellent flow of contrast into the epidural space.  Procedure Details: Using a paramedian approach from the side mentioned above, the region overlying the inferior lamina was localized under fluoroscopic visualization and the soft tissues overlying this structure were infiltrated with 4 ml. of 1% Lidocaine without Epinephrine. The Tuohy needle was inserted into the epidural space using a paramedian approach.   The epidural space was localized using loss of resistance along with lateral and bi-planar fluoroscopic views.  After negative aspirate for air, blood, and CSF, a 2 ml. volume of Isovue-250 was injected into the epidural space and the flow of contrast was observed. Radiographs were obtained for documentation purposes.    The injectate was administered into the level noted above.   Additional Comments:  The patient tolerated the procedure well Dressing: 2 x 2 sterile gauze  and Band-Aid    Post-procedure details: Patient was observed during the procedure. Post-procedure instructions were reviewed.  Patient left the clinic in stable condition.    Clinical History: MRI LUMBAR SPINE WITHOUT CONTRAST  TECHNIQUE: Multiplanar, multisequence MR imaging of the lumbar spine was performed. No intravenous contrast was administered.  COMPARISON:  Lumbar radiographs 11/29/2019. Lumbar MRI 07/13/2016.  FINDINGS: Segmentation:  Normal on the comparison radiographs, which is the same numbering system used on the 2017 MRI.  Alignment: Mildly improved lumbar lordosis since 2017. Subtle degenerative anterolisthesis at L4-L5 and retrolisthesis at L5-S1.  Vertebrae: Degenerative endplate marrow edema at T11-T12 and L4-L5 (series 3, image 9). Background bone marrow signal within normal limits. No other acute osseous abnormality identified. Intact visible sacrum and SI joints.  Conus medullaris and cauda equina: Conus extends to the L1 level. No lower spinal cord or conus signal abnormality.  Paraspinal and other soft tissues: Negative.  Disc levels:  T11-T12: Chronic disc desiccation and disc space loss possibly with vacuum disc now. Chronic circumferential disc bulge, and moderate posterior element hypertrophy with degenerative facet joint fluid (series 5, image 3). Mild spinal stenosis. Mild if any cord mass effect. No associated cord signal abnormality.  T12-L1:  Negative.  L1-L2:  Negative aside from mild facet hypertrophy.  L2-L3: Negative disc. Mild to moderate facet hypertrophy and mild epidural lipomatosis. No significant stenosis.  L3-L4: Disc desiccation and circumferential disc bulge. Moderate facet hypertrophy. Epidural lipomatosis. Broad-based posterior component of disc. Increased mild to moderate spinal stenosis since 2017 (series 5, image 24). No convincing lateral recess or foraminal stenosis.  L4-L5: Chronic disc desiccation and vacuum disc. Circumferential disc bulge and moderate posterior element hypertrophy. Broad-based posterior component of disc. Epidural lipomatosis abates at this level. Mild spinal and bilateral lateral recess stenosis has progressed (series 5, image 30). Mild to moderate left and mild right L4 foraminal stenosis appears stable.  L5-S1: Chronic disc desiccation and mild disc bulge. Mild to moderate facet hypertrophy. Mild epidural lipomatosis. No  spinal or lateral recess stenosis. Moderate left and mild right L5 foraminal stenosis appears stable.  IMPRESSION: 1. Intermittent lower thoracic and lumbar disc, endplate, and posterior element degeneration. Mild degenerative endplate marrow edema at T11-T12 and L4-L5. 2. Increased multifactorial mild to moderate spinal and/or lateral recess stenosis since the 2017 MRI at L3-L4 and L4-L5. 3. Stable up to moderate moderate left L4 and L5 neural foraminal stenosis. 4. Mild lower thoracic spinal stenosis at T11-T12. Mild if any cord mass effect and no cord signal abnormality.   Electronically Signed   By: Genevie Ann M.D.   On: 12/21/2019 20:18     Objective:  VS:  HT:    WT:   BMI:     BP:(!) 149/103  HR:99bpm  TEMP: ( )  RESP:  Physical Exam Constitutional:      General: She is not in acute distress.    Appearance: Normal appearance. She is obese. She is not ill-appearing.  HENT:     Head: Normocephalic and atraumatic.     Right Ear: External ear normal.     Left Ear: External ear normal.  Eyes:     Extraocular Movements: Extraocular movements intact.  Cardiovascular:     Rate and Rhythm: Normal rate.     Pulses: Normal pulses.  Musculoskeletal:     Right lower leg: No edema.     Left lower leg: No edema.     Comments: Patient has good distal strength with no pain over the  greater trochanters.  No clonus or focal weakness.  Skin:    Findings: No erythema, lesion or rash.  Neurological:     General: No focal deficit present.     Mental Status: She is alert and oriented to person, place, and time.     Sensory: No sensory deficit.     Motor: No weakness or abnormal muscle tone.     Coordination: Coordination normal.  Psychiatric:        Mood and Affect: Mood normal.        Behavior: Behavior normal.     Ortho Exam Imaging: XR C-ARM NO REPORT  Result Date: 03/25/2020 Please see Notes tab for imaging impression.

## 2020-03-25 NOTE — Progress Notes (Signed)
 .  Numeric Pain Rating Scale and Functional Assessment Average Pain 8   In the last MONTH (on 0-10 scale) has pain interfered with the following?  1. General activity like being  able to carry out your everyday physical activities such as walking, climbing stairs, carrying groceries, or moving a chair?  Rating(8)   +Driver, -BT, -Dye Allergies.  

## 2020-03-28 ENCOUNTER — Ambulatory Visit: Payer: Medicare Other | Admitting: Orthopaedic Surgery

## 2020-05-16 ENCOUNTER — Other Ambulatory Visit: Payer: Self-pay

## 2020-05-16 ENCOUNTER — Ambulatory Visit (INDEPENDENT_AMBULATORY_CARE_PROVIDER_SITE_OTHER): Payer: Medicare Other | Admitting: Orthopaedic Surgery

## 2020-05-16 DIAGNOSIS — Z6841 Body Mass Index (BMI) 40.0 and over, adult: Secondary | ICD-10-CM

## 2020-05-16 DIAGNOSIS — G8929 Other chronic pain: Secondary | ICD-10-CM

## 2020-05-16 DIAGNOSIS — M545 Low back pain, unspecified: Secondary | ICD-10-CM

## 2020-05-16 DIAGNOSIS — M5416 Radiculopathy, lumbar region: Secondary | ICD-10-CM

## 2020-05-16 NOTE — Progress Notes (Signed)
Office Visit Note   Patient: Sarah Randall           Date of Birth: Aug 08, 1966           MRN: [id number] Visit Date: 05/16/2020              Requested by: Ladell Pier, MD [address] [place],  [place] [address] PCP: Ladell Pier, MD   Assessment & Plan: Visit Diagnoses:  1. Chronic bilateral low back pain, unspecified whether sciatica present   2. Radiculopathy, lumbar region   3. Body mass index 45.0-49.9, adult ([place])   4. Morbid obesity ([place])     Plan: We will go ahead and reorder an ESI with Dr. Ernestina Patches which will hopefully give her some relief.  We discussed that is very important that she make a real effort at weight loss as she is having severe difficulty with ADLs and I think that her body is breaking down due to the excess weight.  We will also make a referral to [facility] surgery for consultation for gastric bypass.  Follow-Up Instructions: Return if symptoms worsen or fail to improve.   Orders:  No orders of the defined types were placed in this encounter.  No orders of the defined types were placed in this encounter.     Procedures: No procedures performed   Clinical Data: No additional findings.   Subjective: Chief Complaint  Patient presents with  . Lower Back - Follow-up    [place] returns today for chronic low back pain and radiculopathy.  She is requesting a a rolling walker for ambulation.  She is having increased pain in her left hip due to over compensation from her right leg pain.  The ESI with Dr. Ernestina Patches helped temporarily.   Review of Systems   Objective: Vital Signs: LMP  (LMP Unknown)   Physical Exam  Ortho Exam Exam is unchanged. Specialty Comments:  No specialty comments available.  Imaging: No results found.   PMFS History: Patient Active Problem List   Diagnosis Date Noted  . Chronic bilateral low back pain 05/16/2020  . Postoperative state 12/27/2019  . Iron deficiency 08/03/2019  . B12  deficiency 06/30/2019  . Chronic right shoulder pain 07/05/2018  . Trochanteric bursitis of left hip 07/05/2018  . Morbid obesity ([place]) 07/05/2018  . Body mass index 45.0-49.9, adult ([place]) 07/05/2018  . Marijuana user 05/26/2018  . Status post total right knee replacement 01/12/2018  . Status post total hip replacement, left 09/22/2017  . Hyperlipidemia 08/10/2017  . Primary osteoarthritis of left hip 06/25/2017  . Primary osteoarthritis of right knee 06/25/2017  . Mild depression ([place]) 06/25/2017  . Essential hypertension 06/25/2017  . Tobacco abuse 06/25/2017  . Prediabetes 06/25/2017  . OSA on CPAP 06/25/2017  . Bronchitis 09/24/2016  . Leukocytosis 09/24/2016  . Radiculopathy, lumbar region 07/16/2016  . Degenerative lumbar disc 07/03/2016  . Synovitis of hip 04/20/2016  . Screening for breast cancer 12/28/2014  . Pain in joint involving lower leg 05/13/2013   Past Medical History:  Diagnosis Date  . Anemia   . Anxiety   . Arthritis   . B12 deficiency 06/30/2019  . Depression   . GERD (gastroesophageal reflux disease)    occasionally takes OTC  . Hyperlipidemia   . Hypertension   . Pre-diabetes   . Sleep apnea    tested 2014 - unable to tolerate machine    Family History  Problem Relation Age of Onset  . Hypertension  Mother   . Diabetes Mother   . Breast cancer Mother 41  . Hypertension Father   . Lung cancer Maternal Aunt   . Colon polyps Brother   . Colon cancer Neg Hx   . Esophageal cancer Neg Hx   . Rectal cancer Neg Hx   . Stomach cancer Neg Hx     Past Surgical History:  Procedure Laterality Date  . LEFT HEART CATH AND CORONARY ANGIOGRAPHY N/A 10/25/2018   Procedure: LEFT HEART CATH AND CORONARY ANGIOGRAPHY;  Surgeon: Martinique, Peter M, MD;  Location: [facility];  Service: Cardiovascular;  Laterality: N/A;  . ROBOTIC ASSISTED LAPAROSCOPIC HYSTERECTOMY AND SALPINGECTOMY Bilateral 12/27/2019   Procedure: XI ROBOTIC ASSISTED TOTAL LAPAROSCOPIC  HYSTERECTOMY AND SALPINGECTOMY;  Surgeon: Princess Bruins, MD;  Location: [facility];  Service: Gynecology;  Laterality: Bilateral;  . TOTAL HIP ARTHROPLASTY Left 09/22/2017   Procedure: LEFT TOTAL HIP ARTHROPLASTY ANTERIOR APPROACH;  Surgeon: Leandrew Koyanagi, MD;  Location: [facility];  Service: Orthopedics;  Laterality: Left;  . TOTAL KNEE ARTHROPLASTY Right 01/12/2018   Procedure: RIGHT TOTAL KNEE ARTHROPLASTY;  Surgeon: Leandrew Koyanagi, MD;  Location: [name];  Service: Orthopedics;  Laterality: Right;  . TUBAL LIGATION     Social History   Occupational History  . Occupation: unemployed  Tobacco Use  . Smoking status: Current Some Day Smoker    Packs/day: 0.25    Years: 30.00    Pack years: 7.50    Types: Cigarettes  . Smokeless tobacco: Never Used  Vaping Use  . Vaping Use: Never used  Substance and Sexual Activity  . Alcohol use: Yes    Alcohol/week: 4.0 - 6.0 standard drinks    Types: 4 - 6 Shots of liquor per week    Comment: occasional  . Drug use: Yes    Types: Marijuana    Comment: smoke 1 joint cannibis 1 week ago.  Marland Kitchen Sexual activity: Yes    Birth control/protection: None    Comment: declined insurance questions,des neg

## 2020-05-20 ENCOUNTER — Other Ambulatory Visit: Payer: Self-pay | Admitting: Internal Medicine

## 2020-05-24 ENCOUNTER — Ambulatory Visit: Payer: Medicare Other | Admitting: Hematology

## 2020-05-24 ENCOUNTER — Other Ambulatory Visit: Payer: Medicare Other

## 2020-06-03 ENCOUNTER — Telehealth: Payer: Self-pay | Admitting: Physical Medicine and Rehabilitation

## 2020-06-03 NOTE — Telephone Encounter (Signed)
Called pt and advised that we do not have any earlier appts and appt has been added to cancellation list. Pt understood.

## 2020-06-03 NOTE — Telephone Encounter (Signed)
Patient called needing an earlier appointment due to the pain she is in. The number to contact patient is [phone]

## 2020-06-13 ENCOUNTER — Other Ambulatory Visit: Payer: Self-pay | Admitting: Internal Medicine

## 2020-06-20 ENCOUNTER — Encounter: Payer: Self-pay | Admitting: Physical Medicine and Rehabilitation

## 2020-06-20 ENCOUNTER — Other Ambulatory Visit: Payer: Self-pay

## 2020-06-20 ENCOUNTER — Ambulatory Visit (INDEPENDENT_AMBULATORY_CARE_PROVIDER_SITE_OTHER): Payer: Medicare Other | Admitting: Physical Medicine and Rehabilitation

## 2020-06-20 ENCOUNTER — Ambulatory Visit: Payer: Self-pay

## 2020-06-20 VITALS — BP 134/95 | HR 82

## 2020-06-20 DIAGNOSIS — M5416 Radiculopathy, lumbar region: Secondary | ICD-10-CM

## 2020-06-20 MED ORDER — METHYLPREDNISOLONE ACETATE 80 MG/ML IJ SUSP
80.0000 mg | Freq: Once | INTRAMUSCULAR | Status: AC
  Administered 2020-06-20: 80 mg

## 2020-06-20 NOTE — Progress Notes (Signed)
Sarah Randall - 54 y.o. female MRN [id number]  Date of birth: 12/05/1966  Office Visit Note: Visit Date: 06/20/2020 PCP: Ladell Pier, MD Referred by: Ladell Pier, MD  Subjective: Chief Complaint  Patient presents with  . Lower Back - Pain  . Right Leg - Pain  . Right Ankle - Pain   HPI:  Sarah Randall is a 54 y.o. female who comes in today at the request of Dr. Eduard Roux for planned Right L5-S1 Lumbar epidural steroid injection with fluoroscopic guidance.  The patient has failed conservative care including home exercise, medications, time and activity modification.  This injection will be diagnostic and hopefully therapeutic.  Please see requesting physician notes for further details and justification.  Prior injection in April of this left-sided L4-5 did well.  We will repeat the injection essentially but do the right side at L5-S1.   ROS Otherwise per HPI.  Assessment & Plan: Visit Diagnoses:  1. Lumbar radiculopathy     Plan: No additional findings.   Meds & Orders:  Meds ordered this encounter  Medications  . methylPREDNISolone acetate (DEPO-MEDROL) injection 80 mg    Orders Placed This Encounter  Procedures  . XR C-ARM NO REPORT  . Epidural Steroid injection    Follow-up: No follow-ups on file.   Procedures: No procedures performed  Lumbar Epidural Steroid Injection - Interlaminar Approach with Fluoroscopic Guidance  Patient: Sarah Randall      Date of Birth: 01-18-1966 MRN: [id number] PCP: Ladell Pier, MD      Visit Date: 06/20/2020   Universal Protocol:     Consent Given By: the patient  Position: PRONE  Additional Comments: Vital signs were monitored before and after the procedure. Patient was prepped and draped in the usual sterile fashion. The correct patient, procedure, and site was verified.   Injection Procedure Details:  Procedure Site One Meds Administered:  Meds ordered this encounter  Medications  .  methylPREDNISolone acetate (DEPO-MEDROL) injection 80 mg     Laterality: Right  Location/Site:  L5-S1  Needle size: 20 G  Needle type: Tuohy  Needle Placement: Paramedian epidural  Findings:   -Comments: Excellent flow of contrast into the epidural space.  Procedure Details: Using a paramedian approach from the side mentioned above, the region overlying the inferior lamina was localized under fluoroscopic visualization and the soft tissues overlying this structure were infiltrated with 4 ml. of 1% Lidocaine without Epinephrine. The Tuohy needle was inserted into the epidural space using a paramedian approach.   The epidural space was localized using loss of resistance along with lateral and bi-planar fluoroscopic views.  After negative aspirate for air, blood, and CSF, a 2 ml. volume of Isovue-250 was injected into the epidural space and the flow of contrast was observed. Radiographs were obtained for documentation purposes.    The injectate was administered into the level noted above.   Additional Comments:  The patient tolerated the procedure well Dressing: 2 x 2 sterile gauze and Band-Aid    Post-procedure details: Patient was observed during the procedure. Post-procedure instructions were reviewed.  Patient left the clinic in stable condition.    Clinical History: MRI LUMBAR SPINE WITHOUT CONTRAST  TECHNIQUE: Multiplanar, multisequence MR imaging of the lumbar spine was performed. No intravenous contrast was administered.  COMPARISON:  Lumbar radiographs 11/29/2019. Lumbar MRI 07/13/2016.  FINDINGS: Segmentation: Normal on the comparison radiographs, which is the same numbering system used on the 2017 MRI.  Alignment: Mildly improved lumbar  lordosis since 2017. Subtle degenerative anterolisthesis at L4-L5 and retrolisthesis at L5-S1.  Vertebrae: Degenerative endplate marrow edema at T11-T12 and L4-L5 (series 3, image 9). Background bone marrow signal  within normal limits. No other acute osseous abnormality identified. Intact visible sacrum and SI joints.  Conus medullaris and cauda equina: Conus extends to the L1 level. No lower spinal cord or conus signal abnormality.  Paraspinal and other soft tissues: Negative.  Disc levels:  T11-T12: Chronic disc desiccation and disc space loss possibly with vacuum disc now. Chronic circumferential disc bulge, and moderate posterior element hypertrophy with degenerative facet joint fluid (series 5, image 3). Mild spinal stenosis. Mild if any cord mass effect. No associated cord signal abnormality.  T12-L1:  Negative.  L1-L2:  Negative aside from mild facet hypertrophy.  L2-L3: Negative disc. Mild to moderate facet hypertrophy and mild epidural lipomatosis. No significant stenosis.  L3-L4: Disc desiccation and circumferential disc bulge. Moderate facet hypertrophy. Epidural lipomatosis. Broad-based posterior component of disc. Increased mild to moderate spinal stenosis since 2017 (series 5, image 24). No convincing lateral recess or foraminal stenosis.  L4-L5: Chronic disc desiccation and vacuum disc. Circumferential disc bulge and moderate posterior element hypertrophy. Broad-based posterior component of disc. Epidural lipomatosis abates at this level. Mild spinal and bilateral lateral recess stenosis has progressed (series 5, image 30). Mild to moderate left and mild right L4 foraminal stenosis appears stable.  L5-S1: Chronic disc desiccation and mild disc bulge. Mild to moderate facet hypertrophy. Mild epidural lipomatosis. No spinal or lateral recess stenosis. Moderate left and mild right L5 foraminal stenosis appears stable.  IMPRESSION: 1. Intermittent lower thoracic and lumbar disc, endplate, and posterior element degeneration. Mild degenerative endplate marrow edema at T11-T12 and L4-L5. 2. Increased multifactorial mild to moderate spinal and/or lateral recess  stenosis since the 2017 MRI at L3-L4 and L4-L5. 3. Stable up to moderate moderate left L4 and L5 neural foraminal stenosis. 4. Mild lower thoracic spinal stenosis at T11-T12. Mild if any cord mass effect and no cord signal abnormality.   Electronically Signed   By: Genevie Ann M.D.   On: 12/21/2019 20:18     Objective:  VS:  HT:    WT:   BMI:     BP:(!) 134/95  HR:82bpm  TEMP: ( )  RESP:  Physical Exam Constitutional:      General: She is not in acute distress.    Appearance: Normal appearance. She is obese. She is not ill-appearing.  HENT:     Head: Normocephalic and atraumatic.     Right Ear: External ear normal.     Left Ear: External ear normal.  Eyes:     Extraocular Movements: Extraocular movements intact.  Cardiovascular:     Rate and Rhythm: Normal rate.     Pulses: Normal pulses.  Musculoskeletal:     Right lower leg: No edema.     Left lower leg: No edema.     Comments: Patient has good distal strength with no pain over the greater trochanters.  No clonus or focal weakness.  Skin:    Findings: No erythema, lesion or rash.  Neurological:     General: No focal deficit present.     Mental Status: She is alert and oriented to person, place, and time.     Sensory: No sensory deficit.     Motor: No weakness or abnormal muscle tone.     Coordination: Coordination normal.  Psychiatric:        Mood and Affect: Mood  normal.        Behavior: Behavior normal.      Imaging: No results found.

## 2020-06-20 NOTE — Progress Notes (Signed)
  Pt states she has pain in her lower back that travel down to her right leg and ankles. Pt states walking and sitting for a long time makes her back worse. Pt state using a heating pad makes the pain feel better. Pt hx of epidural inj on 03/25/2020.   Numeric Pain Rating Scale and Functional Assessment Average Pain 9   In the last MONTH (on 0-10 scale) has pain interfered with the following?  1. General activity like being  able to carry out your everyday physical activities such as walking, climbing stairs, carrying groceries, or moving a chair?  Rating(10)   +Driver, -BT, -Dye Allergies.

## 2020-07-03 ENCOUNTER — Other Ambulatory Visit: Payer: Self-pay | Admitting: Internal Medicine

## 2020-07-03 DIAGNOSIS — K219 Gastro-esophageal reflux disease without esophagitis: Secondary | ICD-10-CM

## 2020-07-03 DIAGNOSIS — I10 Essential (primary) hypertension: Secondary | ICD-10-CM

## 2020-07-03 NOTE — Telephone Encounter (Signed)
Requested medications are due for refill today?  Yes  Requested medications are on active medication list?  Yes  Last Refill:   08/03/2019  # 90 with 3 refills  Future visit scheduled?  No   Notes to Clinic: Medication failed Rx refill protocol due to  no labs within the past 180 days.  Last labs were performed on 06/20/2019.

## 2020-07-03 NOTE — Telephone Encounter (Signed)
Requested Prescriptions  Pending Prescriptions Disp Refills   esomeprazole (NEXIUM) 40 MG capsule [Pharmacy Med Name: ESOMEPRAZOLE MAG DR 40 MG CAP] 90 capsule 1    Sig: TAKE 1 CAPSULE BY MOUTH EVERY DAY     Gastroenterology: Proton Pump Inhibitors Passed - 07/03/2020  2:12 AM      Passed - Valid encounter within last 12 months    Recent Outpatient Visits          6 months ago Rhinovirus   [place], [place] J, NP   7 months ago [place] Ladell Pier, MD   10 months ago Symptomatic hypotension   [place] Ladell Pier, MD   11 months ago Essential hypertension   [place] Ladell Pier, MD   1 year ago Prediabetes   [place], Charlane Ferretti, MD              losartan (COZAAR) 50 MG tablet [Pharmacy Med Name: LOSARTAN POTASSIUM 50 MG TAB] 90 tablet 3    Sig: TAKE 1 TABLET BY MOUTH EVERY DAY     Cardiovascular:  Angiotensin Receptor Blockers Failed - 07/03/2020  2:12 AM      Failed - Cr in normal range and within 180 days    Creatinine  Date Value Ref Range Status  06/15/2019 0.94 0.44 - 1.00 mg/dL Final  08/10/2017 150.1 20.0 - 300.0 mg/dL Final   Creatinine, Ser  Date Value Ref Range Status  06/20/2019 0.92 0.57 - 1.00 mg/dL Final         Failed - K in normal range and within 180 days    Potassium  Date Value Ref Range Status  06/20/2019 4.0 3.5 - 5.2 mmol/L Final         Failed - Last BP in normal range    BP Readings from Last 1 Encounters:  06/20/20 (!) 134/95         Failed - Valid encounter within last 6 months    Recent Outpatient Visits          6 months ago Rhinovirus   [place], [place], NP   7 months ago [place] Ladell Pier, MD   10 months ago Symptomatic hypotension    [place] Ladell Pier, MD   11 months ago Essential hypertension   [place], Deborah B, MD   1 year ago Prediabetes   [place], MD             Passed - Patient is not pregnant

## 2020-07-07 ENCOUNTER — Other Ambulatory Visit: Payer: Self-pay | Admitting: Internal Medicine

## 2020-07-07 NOTE — Telephone Encounter (Signed)
Requested Prescriptions  Pending Prescriptions Disp Refills  . hydrochlorothiazide (HYDRODIURIL) 12.5 MG tablet [Pharmacy Med Name: HYDROCHLOROTHIAZIDE 12.5 MG TB] 66 tablet 0    Sig: TAKE 1 TABLET BY MOUTH EVERY DAY     Cardiovascular: Diuretics - Thiazide Failed - 07/07/2020  1:32 PM      Failed - Ca in normal range and within 360 days    Calcium  Date Value Ref Range Status  06/20/2019 9.4 8.7 - 10.2 mg/dL Final         Failed - Cr in normal range and within 360 days    Creatinine  Date Value Ref Range Status  06/15/2019 0.94 0.44 - 1.00 mg/dL Final  08/10/2017 150.1 20.0 - 300.0 mg/dL Final   Creatinine, Ser  Date Value Ref Range Status  06/20/2019 0.92 0.57 - 1.00 mg/dL Final         Failed - K in normal range and within 360 days    Potassium  Date Value Ref Range Status  06/20/2019 4.0 3.5 - 5.2 mmol/L Final         Failed - Na in normal range and within 360 days    Sodium  Date Value Ref Range Status  06/20/2019 138 134 - 144 mmol/L Final         Failed - Last BP in normal range    BP Readings from Last 1 Encounters:  06/20/20 (!) 134/95         Failed - Valid encounter within last 6 months    Recent Outpatient Visits          6 months ago Rhinovirus   [place], [place], NP   7 months ago Abscess   [place], MD   10 months ago Symptomatic hypotension   [place], Deborah B, MD   11 months ago Essential hypertension   [place], Deborah B, MD   1 year ago Prediabetes   [place], [place], MD      Future Appointments            In 4 days Leandrew Koyanagi, MD [facility]   In 2 months Ladell Pier, MD [place]

## 2020-07-11 ENCOUNTER — Ambulatory Visit: Payer: Medicare Other | Admitting: Orthopaedic Surgery

## 2020-07-15 NOTE — Procedures (Signed)
Lumbar Epidural Steroid Injection - Interlaminar Approach with Fluoroscopic Guidance  Patient: Sarah Randall      Date of Birth: May 19, 1966 MRN: [id number] PCP: Ladell Pier, MD      Visit Date: 06/20/2020   Universal Protocol:     Consent Given By: the patient  Position: PRONE  Additional Comments: Vital signs were monitored before and after the procedure. Patient was prepped and draped in the usual sterile fashion. The correct patient, procedure, and site was verified.   Injection Procedure Details:  Procedure Site One Meds Administered:  Meds ordered this encounter  Medications  . methylPREDNISolone acetate (DEPO-MEDROL) injection 80 mg     Laterality: Right  Location/Site:  L5-S1  Needle size: 20 G  Needle type: Tuohy  Needle Placement: Paramedian epidural  Findings:   -Comments: Excellent flow of contrast into the epidural space.  Procedure Details: Using a paramedian approach from the side mentioned above, the region overlying the inferior lamina was localized under fluoroscopic visualization and the soft tissues overlying this structure were infiltrated with 4 ml. of 1% Lidocaine without Epinephrine. The Tuohy needle was inserted into the epidural space using a paramedian approach.   The epidural space was localized using loss of resistance along with lateral and bi-planar fluoroscopic views.  After negative aspirate for air, blood, and CSF, a 2 ml. volume of Isovue-250 was injected into the epidural space and the flow of contrast was observed. Radiographs were obtained for documentation purposes.    The injectate was administered into the level noted above.   Additional Comments:  The patient tolerated the procedure well Dressing: 2 x 2 sterile gauze and Band-Aid    Post-procedure details: Patient was observed during the procedure. Post-procedure instructions were reviewed.  Patient left the clinic in stable condition.

## 2020-07-18 ENCOUNTER — Encounter: Payer: Self-pay | Admitting: Orthopaedic Surgery

## 2020-07-18 ENCOUNTER — Ambulatory Visit: Payer: Self-pay

## 2020-07-18 ENCOUNTER — Ambulatory Visit (INDEPENDENT_AMBULATORY_CARE_PROVIDER_SITE_OTHER): Payer: Medicare Other | Admitting: Orthopaedic Surgery

## 2020-07-18 VITALS — Ht 64.0 in | Wt 288.0 lb

## 2020-07-18 DIAGNOSIS — G8929 Other chronic pain: Secondary | ICD-10-CM

## 2020-07-18 DIAGNOSIS — M25562 Pain in left knee: Secondary | ICD-10-CM | POA: Diagnosis not present

## 2020-07-18 DIAGNOSIS — M79605 Pain in left leg: Secondary | ICD-10-CM | POA: Diagnosis not present

## 2020-07-18 MED ORDER — METHYLPREDNISOLONE ACETATE 40 MG/ML IJ SUSP
40.0000 mg | INTRAMUSCULAR | Status: AC | PRN
  Administered 2020-07-18: 40 mg via INTRA_ARTICULAR

## 2020-07-18 MED ORDER — BUPIVACAINE HCL 0.5 % IJ SOLN
2.0000 mL | INTRAMUSCULAR | Status: AC | PRN
  Administered 2020-07-18: 2 mL via INTRA_ARTICULAR

## 2020-07-18 MED ORDER — ACETAMINOPHEN-CODEINE #3 300-30 MG PO TABS: 1.0000 | ORAL_TABLET | Freq: Every day | ORAL | 0 refills | Status: AC | PRN

## 2020-07-18 MED ORDER — LIDOCAINE HCL 1 % IJ SOLN
2.0000 mL | INTRAMUSCULAR | Status: AC | PRN
  Administered 2020-07-18: 2 mL

## 2020-07-18 NOTE — Progress Notes (Signed)
Office Visit Note   Patient: Sarah Randall           Date of Birth: [birth year]           MRN: [id number] Visit Date: 07/18/2020              Requested by: Ladell Pier, MD [address] [place],  [place] [address] PCP: Ladell Pier, MD   Assessment & Plan: Visit Diagnoses:  1. Chronic pain of left knee   2. Pain in left leg     Plan: Impression is chronic left knee DJD and chronic low back pain.  For the left knee we performed aspiration injection today that yielded 15 cc of joint fluid.  Patient tolerates well.  Prescription for Tylenol with codeine.  We will make a referral to Dr. Kathyrn Sheriff for further evaluation and treatment and possible surgical consultation for her chronic back pain.  Follow-Up Instructions: Return if symptoms worsen or fail to improve.   Orders:  Orders Placed This Encounter  Procedures  . XR KNEE 3 VIEW LEFT  . Ambulatory referral to Neurosurgery   Meds ordered this encounter  Medications  . acetaminophen-codeine (TYLENOL #3) 300-30 MG tablet    Sig: Take 1-2 tablets by mouth daily as needed for up to 7 days for moderate pain.    Dispense:  30 tablet    Refill:  0      Procedures: Large Joint Inj: L knee on 07/18/2020 12:00 PM Details: 22 G needle Medications: 2 mL bupivacaine 0.5 %; 2 mL lidocaine 1 %; 40 mg methylPREDNISolone acetate 40 MG/ML Outcome: tolerated well, no immediate complications Patient was prepped and draped in the usual sterile fashion.       Clinical Data: No additional findings.   Subjective: Chief Complaint  Patient presents with  . Left Knee - Pain    [name] comes back in today for recurrence of chronic left knee pain.  She feels like there is some swelling.  Over-the-counter medications do not help.  She feels like it has given away once on her.  Lumbar ESI by Dr. Ernestina Patches did not give her much relief and she is requesting a referral to a spine surgeon.   Review of Systems   Objective: Vital  Signs: Ht 5\' 4"  (1.626 m)   Wt 288 lb (130.6 kg)   LMP  (LMP Unknown)   BMI 49.44 kg/m   Physical Exam  Ortho Exam Exams are unchanged. Specialty Comments:  No specialty comments available.  Imaging: XR KNEE 3 VIEW LEFT  Result Date: 07/18/2020 Stable degenerative changes.    PMFS History: Patient Active Problem List   Diagnosis Date Noted  . Chronic bilateral low back pain 05/16/2020  . Postoperative state 12/27/2019  . Iron deficiency 08/03/2019  . B12 deficiency 06/30/2019  . Chronic right shoulder pain 07/05/2018  . Trochanteric bursitis of left hip 07/05/2018  . Morbid obesity ([place]) 07/05/2018  . Body mass index 45.0-49.9, adult ([place]) 07/05/2018  . Marijuana user 05/26/2018  . Status post total right knee replacement 01/12/2018  . Status post total hip replacement, left 09/22/2017  . Hyperlipidemia 08/10/2017  . Primary osteoarthritis of left hip 06/25/2017  . Primary osteoarthritis of right knee 06/25/2017  . Mild depression ([place]) 06/25/2017  . Essential hypertension 06/25/2017  . Tobacco abuse 06/25/2017  . Prediabetes 06/25/2017  . OSA on CPAP 06/25/2017  . Bronchitis 09/24/2016  . Leukocytosis 09/24/2016  . Radiculopathy, lumbar region 07/16/2016  . Degenerative lumbar  disc 07/03/2016  . Synovitis of hip 04/20/2016  . Screening for breast cancer 12/28/2014  . Pain in joint involving lower leg 05/13/2013   Past Medical History:  Diagnosis Date  . Anemia   . Anxiety   . Arthritis   . B12 deficiency 06/30/2019  . Depression   . GERD (gastroesophageal reflux disease)    occasionally takes OTC  . Hyperlipidemia   . Hypertension   . Pre-diabetes   . Sleep apnea    tested 2014 - unable to tolerate machine    Family History  Problem Relation Age of Onset  . Hypertension Mother   . Diabetes Mother   . Breast cancer Mother 16  . Hypertension Father   . Lung cancer Maternal Aunt   . Colon polyps Brother   . Colon cancer Neg Hx   . Esophageal  cancer Neg Hx   . Rectal cancer Neg Hx   . Stomach cancer Neg Hx     Past Surgical History:  Procedure Laterality Date  . LEFT HEART CATH AND CORONARY ANGIOGRAPHY N/A 10/25/2018   Procedure: LEFT HEART CATH AND CORONARY ANGIOGRAPHY;  Surgeon: Martinique, Peter M, MD;  Location: [facility];  Service: Cardiovascular;  Laterality: N/A;  . ROBOTIC ASSISTED LAPAROSCOPIC HYSTERECTOMY AND SALPINGECTOMY Bilateral 12/27/2019   Procedure: XI ROBOTIC ASSISTED TOTAL LAPAROSCOPIC HYSTERECTOMY AND SALPINGECTOMY;  Surgeon: Princess Bruins, MD;  Location: [facility];  Service: Gynecology;  Laterality: Bilateral;  . TOTAL HIP ARTHROPLASTY Left 09/22/2017   Procedure: LEFT TOTAL HIP ARTHROPLASTY ANTERIOR APPROACH;  Surgeon: Leandrew Koyanagi, MD;  Location: [facility];  Service: Orthopedics;  Laterality: Left;  . TOTAL KNEE ARTHROPLASTY Right 01/12/2018   Procedure: RIGHT TOTAL KNEE ARTHROPLASTY;  Surgeon: Leandrew Koyanagi, MD;  Location: [facility];  Service: Orthopedics;  Laterality: Right;  . TUBAL LIGATION     Social History   Occupational History  . Occupation: unemployed  Tobacco Use  . Smoking status: Current Some Day Smoker    Packs/day: 0.25    Years: 30.00    Pack years: 7.50    Types: Cigarettes  . Smokeless tobacco: Never Used  Vaping Use  . Vaping Use: Never used  Substance and Sexual Activity  . Alcohol use: Yes    Alcohol/week: 4.0 - 6.0 standard drinks    Types: 4 - 6 Shots of liquor per week    Comment: occasional  . Drug use: Yes    Types: Marijuana    Comment: smoke 1 joint cannibis 1 week ago.  Marland Kitchen Sexual activity: Yes    Birth control/protection: None    Comment: declined insurance questions,des neg

## 2020-08-19 ENCOUNTER — Encounter: Payer: Self-pay | Admitting: Physical Therapy

## 2020-08-19 ENCOUNTER — Ambulatory Visit: Payer: Medicare Other | Attending: Neurosurgery | Admitting: Physical Therapy

## 2020-08-19 ENCOUNTER — Other Ambulatory Visit: Payer: Self-pay

## 2020-08-19 DIAGNOSIS — M6281 Muscle weakness (generalized): Secondary | ICD-10-CM | POA: Insufficient documentation

## 2020-08-19 DIAGNOSIS — M62830 Muscle spasm of back: Secondary | ICD-10-CM | POA: Diagnosis present

## 2020-08-19 DIAGNOSIS — G8929 Other chronic pain: Secondary | ICD-10-CM | POA: Insufficient documentation

## 2020-08-19 DIAGNOSIS — R2689 Other abnormalities of gait and mobility: Secondary | ICD-10-CM | POA: Diagnosis present

## 2020-08-19 DIAGNOSIS — M5441 Lumbago with sciatica, right side: Secondary | ICD-10-CM | POA: Diagnosis not present

## 2020-08-19 NOTE — Therapy (Signed)
[place], [place], [address] Phone: [phone]   Fax:  [phone]  Physical Therapy Evaluation  Patient Details  Name: Sarah Randall MRN: [id number] Date of Birth: [birth year] Referring Provider (PT):  Vallarie Mare, MD    Encounter Date: 08/19/2020   PT End of Session - 08/19/20 1549    Visit Number 1    Number of Visits 13    Date for PT Re-Evaluation 09/30/20    Authorization Type MCR - KX mod 15th visit    Progress Note Due on Visit 10    PT Start Time 1547    PT Stop Time 1631    PT Time Calculation (min) 44 min    Activity Tolerance Patient tolerated treatment well    Behavior During Therapy [employer] for tasks assessed/performed           Past Medical History:  Diagnosis Date  . Anemia   . Anxiety   . Arthritis   . B12 deficiency 06/30/2019  . Depression   . GERD (gastroesophageal reflux disease)    occasionally takes OTC  . Hyperlipidemia   . Hypertension   . Pre-diabetes   . Sleep apnea    tested 2014 - unable to tolerate machine    Past Surgical History:  Procedure Laterality Date  . LEFT HEART CATH AND CORONARY ANGIOGRAPHY N/A 10/25/2018   Procedure: LEFT HEART CATH AND CORONARY ANGIOGRAPHY;  Surgeon: Martinique, Peter M, MD;  Location: [facility];  Service: Cardiovascular;  Laterality: N/A;  . ROBOTIC ASSISTED LAPAROSCOPIC HYSTERECTOMY AND SALPINGECTOMY Bilateral 12/27/2019   Procedure: XI ROBOTIC ASSISTED TOTAL LAPAROSCOPIC HYSTERECTOMY AND SALPINGECTOMY;  Surgeon: Princess Bruins, MD;  Location: [facility];  Service: Gynecology;  Laterality: Bilateral;  . TOTAL HIP ARTHROPLASTY Left 09/22/2017   Procedure: LEFT TOTAL HIP ARTHROPLASTY ANTERIOR APPROACH;  Surgeon: Leandrew Koyanagi, MD;  Location: [facility];  Service: Orthopedics;  Laterality: Left;  . TOTAL KNEE ARTHROPLASTY Right 01/12/2018   Procedure: RIGHT TOTAL KNEE ARTHROPLASTY;  Surgeon: Leandrew Koyanagi, MD;   Location: [facility];  Service: Orthopedics;  Laterality: Right;  . TUBAL LIGATION      There were no vitals filed for this visit.    Subjective Assessment - 08/19/20 1552    Subjective pt is a 54 y.o F with CC of low back pain that started after she got her hip replaced and has progressively especially in the last year. She report referred pain into RLE around below the knee. She denies incontinence, she reports some numbess in the groin but reports it as intermittent.    Limitations Standing;Walking;Sitting    How long can you sit comfortably? 20 min    How long can you stand comfortably? 20 min    How long can you walk comfortably? 20 min    Diagnostic tests 12/21/2019 MRI IMPRESSION:1. Intermittent lower thoracic and lumbar disc, endplate, andposterior element degeneration. Mild degenerative endplate marrowedema at T11-T12 and L4-L5.2. Increased multifactorial mild to moderate spinal and/or lateralrecess stenosis since the 2017 MRI at L3-L4 and L4-L5.3. Stable up to moderate moderate left L4 and L5 neural foraminalstenosis.4. Mild lower thoracic spinal stenosis at T11-T12. Mild if any cordmass effect and no cord signal abnormality.    Patient Stated Goals to get better, decrease pain  get back to functioning with out issues.    Currently in Pain? Yes    Pain Score 6     Pain Location Back  Pain Orientation Left    Pain Descriptors / Indicators Tightness;Aching;Pressure    Pain Type Chronic pain    Pain Radiating Towards just below the knee on the R side    Pain Onset More than a month ago    Pain Frequency Constant    Aggravating Factors  prolonged sitting, standing and walking. bending forward    Pain Relieving Factors laying down, heating pad    Effect of Pain on Daily Activities limited endurance with standing/ walking.              [facility] PT Assessment - 08/19/20 0001      Assessment   Medical Diagnosis Spondylolisthesis, lumbar region M43.16    Referring Provider (PT)   Vallarie Mare, MD     Onset Date/Surgical Date --   since 2018 with increased worsening in the last year   Hand Dominance Right    Next MD Visit --   October 10/2020   Prior Therapy yes   for low backp     Precautions   Precautions None      Restrictions   Weight Bearing Restrictions No      Balance Screen   Has the patient fallen in the past 6 months No    Has the patient had a decrease in activity level because of a fear of falling?  No    Is the patient reluctant to leave their home because of a fear of falling?  No      Home Social worker Private residence    Living Arrangements Spouse/significant other    Type of [place] to enter    Entrance Stairs-Number of Steps 1    Entrance Stairs-Rails None    Home Layout One level    Talpa - 2 wheels;Cane - single point;Grab bars - tub/shower;Grab bars - toilet;Shower seat      Prior Function   Level of Independence Independent    Vocation On disability    Leisure hanging out with family      Cognition   Overall Cognitive Status Within Functional Limits for tasks assessed      Observation/Other Assessments   Focus on Therapeutic Outcomes (FOTO)  57% limited   prediction 45% limited     Posture/Postural Control   Posture/Postural Control Postural limitations    Postural Limitations Rounded Shoulders;Forward head      ROM / Strength   AROM / PROM / Strength AROM;Strength      AROM   Overall AROM Comments REIS pt reports centralization from her hps to her back    AROM Assessment Site Lumbar    Lumbar Flexion 70   end range stiffness and pain coming back to neutral   Lumbar Extension 28   pain during motion   Lumbar - Right Side Bend 40    Lumbar - Left Side Bend 28      Strength   Strength Assessment Site Hip;Knee    Right/Left Hip Right;Left    Right Hip Flexion 4/5    Right Hip Extension 4-/5    Right Hip ABduction 4-/5    Left Hip Flexion 4/5     Left Hip Extension 4-/5    Left Hip ABduction 4-/5    Right/Left Knee Right;Left    Right Knee Flexion 5/5    Right Knee Extension 5/5    Left Knee Flexion 5/5    Left Knee Extension 5/5  Palpation   Palpation comment TTP over R lumbar paraspinals       Special Tests    Special Tests Lumbar    Lumbar Tests Straight Leg Raise      Straight Leg Raise   Findings Positive    Side  Right      Ambulation/Gait   Ambulation/Gait Yes    Gait Pattern Step-through pattern;Decreased stride length;Antalgic                      Objective measurements completed on examination: See above findings.       [facility] Adult PT Treatment/Exercise - 08/19/20 0001      Lumbar Exercises: Stretches   Prone on Elbows Stretch Limitations 2 x 10   demonstrated centralization     Knee/Hip Exercises: Supine   Bridges 1 set;10 reps;Both;Strengthening      Knee/Hip Exercises: Sidelying   Hip ABduction 10 reps;Both;1 set                  PT Education - 08/19/20 1603    Education Details evaluation findings, POC, goals, HEP with proper form/ rationale.    Person(s) Educated Patient    Methods Explanation;Verbal cues;Handout    Comprehension Verbalized understanding;Verbal cues required            PT Short Term Goals - 08/19/20 1645      PT SHORT TERM GOAL #1   Title pt to be I with inital HEP    Baseline -    Time 3    Period Weeks    Status New    Target Date 09/09/20      PT SHORT TERM GOAL #2   Title pt to verbalize/ demo efficient posture and lifting mechancis to reduce and prevent low back pain    Baseline -    Time 3    Period Weeks    Status New    Target Date 10/14/20      PT SHORT TERM GOAL #3   Title -    Baseline -             PT Long Term Goals - 08/19/20 1645      PT LONG TERM GOAL #1   Title increase bil gross hip strength to >/= 4+/5 to promote hip stability and maximize efficient posture.    Time 6    Period Weeks    Status New      Target Date 09/30/20      PT LONG TERM GOAL #2   Title pt to report no RLE referral for >/= 1 week for functional improvement and QOL    Time 6    Period Weeks    Status New    Target Date 09/30/20      PT LONG TERM GOAL #3   Title pt to be able to walk/ standing for >/= 45 min  </= 2/10 pain for functional endurance required for community ambulation    Time 6    Period Weeks    Status New    Target Date 09/30/20      PT LONG TERM GOAL #4   Title pt to increase FOTO score to </= 45% limited to demo improvement in function    Time 6    Period Weeks    Status New    Target Date 09/30/20      PT LONG TERM GOAL #5   Title pt to be IND with all HEP given  to maintain and progress current LOF IND    Time 6    Period Weeks    Status New    Target Date 09/30/20                  Plan - 08/19/20 1604    Clinical Impression Statement pt presents to OPPt with CC of low back pain starting back when she had her THA in 2018 and progressively worsened especially in the last year. she has functional trunk ROM with end range stiffness and pain with returning neutral. weakness is noted in bil hips, and demonstrates antalgic gait pattern with stride noted. pt responded with centralization with repeated extension in standing, avoid extreme end range due to dx. She would benefit from physical therapy to decrease low back pain, reduce RLE referral, improve hip / core strength and maximize overall function by addressing the deficits listed.    Personal Factors and Comorbidities Comorbidity 3+    Comorbidities hx of pre-diabetes, depression, anxiety, anemia    Examination-Activity Limitations Lift;Locomotion Level;Squat;Stand    Stability/Clinical Decision Making Evolving/Moderate complexity    Clinical Decision Making Moderate    Rehab Potential Good    PT Frequency 2x / week    PT Duration 6 weeks    PT Treatment/Interventions ADLs/Self Care Home Management;Cryotherapy;Electrical  Stimulation;Iontophoresis 4mg /ml Dexamethasone;Moist Heat;Ultrasound;Traction;Therapeutic exercise;Balance training;Therapeutic activities;Functional mobility training;Neuromuscular re-education;Patient/family education;Manual techniques;Passive range of motion;Dry needling;Taping    PT Next Visit Plan review/ update HEP PRN, Review FOTO and provide handout. if continued to tolerate prone extension with centralization, progress as able. gross hip/ core strengthening and posture education    PT Home Exercise Plan [id number] - prone on elbows, supine marching, bridge, sidelying hip abduction    Consulted and Agree with Plan of Care Patient           Patient will benefit from skilled therapeutic intervention in order to improve the following deficits and impairments:  Improper body mechanics, Increased muscle spasms, Decreased strength, Abnormal gait, Obesity, Pain, Decreased activity tolerance, Decreased balance, Decreased endurance, Postural dysfunction  Visit Diagnosis: Chronic bilateral low back pain with right-sided sciatica  Other abnormalities of gait and mobility  Muscle weakness (generalized)  Muscle spasm of back     Problem List Patient Active Problem List   Diagnosis Date Noted  . Chronic bilateral low back pain 05/16/2020  . Postoperative state 12/27/2019  . Iron deficiency 08/03/2019  . B12 deficiency 06/30/2019  . Chronic right shoulder pain 07/05/2018  . Trochanteric bursitis of left hip 07/05/2018  . Morbid obesity ([place]) 07/05/2018  . Body mass index 45.0-49.9, adult ([place]) 07/05/2018  . Marijuana user 05/26/2018  . Status post total right knee replacement 01/12/2018  . Status post total hip replacement, left 09/22/2017  . Hyperlipidemia 08/10/2017  . Primary osteoarthritis of left hip 06/25/2017  . Primary osteoarthritis of right knee 06/25/2017  . Mild depression ([place]) 06/25/2017  . Essential hypertension 06/25/2017  . Tobacco abuse 06/25/2017  . Prediabetes  06/25/2017  . OSA on CPAP 06/25/2017  . Bronchitis 09/24/2016  . Leukocytosis 09/24/2016  . Radiculopathy, lumbar region 07/16/2016  . Degenerative lumbar disc 07/03/2016  . Synovitis of hip 04/20/2016  . Screening for breast cancer 12/28/2014  . Pain in joint involving lower leg 05/13/2013    Starr Lake PT, DPT, LAT, ATC  08/19/20  4:53 PM      [facility] [address] [place], [place], [address] Phone: [phone]   Fax:  [phone]  Name: Sarah Randall MRN: [id number] Date of Birth: 1966-07-02

## 2020-08-23 ENCOUNTER — Other Ambulatory Visit: Payer: Self-pay | Admitting: Internal Medicine

## 2020-08-23 DIAGNOSIS — I10 Essential (primary) hypertension: Secondary | ICD-10-CM

## 2020-08-23 NOTE — Telephone Encounter (Signed)
Requested  medications are  due for refill today yes  Requested medications are on the active medication list yes  Last refill 5/5  Last visit 11/2019  Future visit scheduled 10/2020  Notes to clinic Failed protocol of visit within 6 months

## 2020-09-03 ENCOUNTER — Encounter: Payer: Self-pay | Admitting: Physical Therapy

## 2020-09-03 ENCOUNTER — Other Ambulatory Visit: Payer: Self-pay

## 2020-09-03 ENCOUNTER — Ambulatory Visit: Payer: Medicare Other | Admitting: Physical Therapy

## 2020-09-03 DIAGNOSIS — M6281 Muscle weakness (generalized): Secondary | ICD-10-CM

## 2020-09-03 DIAGNOSIS — M5441 Lumbago with sciatica, right side: Secondary | ICD-10-CM

## 2020-09-03 DIAGNOSIS — R2689 Other abnormalities of gait and mobility: Secondary | ICD-10-CM

## 2020-09-03 DIAGNOSIS — G8929 Other chronic pain: Secondary | ICD-10-CM

## 2020-09-03 DIAGNOSIS — M62830 Muscle spasm of back: Secondary | ICD-10-CM

## 2020-09-03 NOTE — Therapy (Signed)
Sarah Randall, [place], [address] Phone: [phone]   Fax:  [phone]  Physical Therapy Treatment  Patient Details  Name: Sarah Randall MRN: [id number] Date of Birth: 1966-08-25 Referring Provider (PT):  Vallarie Mare, MD    Encounter Date: 09/03/2020   PT End of Session - 09/03/20 1504    Visit Number 2    Number of Visits 13    Date for PT Re-Evaluation 09/30/20    Authorization Type MCR - KX mod 15th visit    Progress Note Due on Visit 10    PT Start Time 1502    PT Stop Time 1542    PT Time Calculation (min) 40 min    Activity Tolerance Patient tolerated treatment well    Behavior During Therapy [facility] for tasks assessed/performed           Past Medical History:  Diagnosis Date  . Anemia   . Anxiety   . Arthritis   . B12 deficiency 06/30/2019  . Depression   . GERD (gastroesophageal reflux disease)    occasionally takes OTC  . Hyperlipidemia   . Hypertension   . Pre-diabetes   . Sleep apnea    tested 2014 - unable to tolerate machine    Past Surgical History:  Procedure Laterality Date  . LEFT HEART CATH AND CORONARY ANGIOGRAPHY N/A 10/25/2018   Procedure: LEFT HEART CATH AND CORONARY ANGIOGRAPHY;  Surgeon: Martinique, Peter M, MD;  Location: [place] [facility];  Service: Cardiovascular;  Laterality: N/A;  . ROBOTIC ASSISTED LAPAROSCOPIC HYSTERECTOMY AND SALPINGECTOMY Bilateral 12/27/2019   Procedure: XI ROBOTIC ASSISTED TOTAL LAPAROSCOPIC HYSTERECTOMY AND SALPINGECTOMY;  Surgeon: Princess Bruins, MD;  Location: [place];  Service: Gynecology;  Laterality: Bilateral;  . TOTAL HIP ARTHROPLASTY Left 09/22/2017   Procedure: LEFT TOTAL HIP ARTHROPLASTY ANTERIOR APPROACH;  Surgeon: Leandrew Koyanagi, MD;  Location: [place];  Service: Orthopedics;  Laterality: Left;  . TOTAL KNEE ARTHROPLASTY Right 01/12/2018   Procedure: RIGHT TOTAL KNEE ARTHROPLASTY;  Surgeon: Leandrew Koyanagi, MD;   Location: [place];  Service: Orthopedics;  Laterality: Right;  . TUBAL LIGATION      There were no vitals filed for this visit.   Subjective Assessment - 09/03/20 1505    Subjective "I haven't been able to do all exercise since the last session.    Diagnostic tests 12/21/2019 MRI IMPRESSION:1. Intermittent lower thoracic and lumbar disc, endplate, andposterior element degeneration. Mild degenerative endplate marrowedema at T11-T12 and L4-L5.2. Increased multifactorial mild to moderate spinal and/or lateralrecess stenosis since the 2017 MRI at L3-L4 and L4-L5.3. Stable up to moderate moderate left L4 and L5 neural foraminalstenosis.4. Mild lower thoracic spinal stenosis at T11-T12. Mild if any cordmass effect and no cord signal abnormality.    Patient Stated Goals to get better, decrease pain  get back to functioning with out issues.    Currently in Pain? Yes    Pain Score 6     Pain Orientation Left    Pain Onset More than a month ago    Aggravating Factors  porlongd sitting, standing, walking, bending forward    Pain Relieving Factors laying down, heating pad              [facility] PT Assessment - 09/03/20 0001      Assessment   Medical Diagnosis Spondylolisthesis, lumbar region M43.16    Referring Provider (PT)  Vallarie Mare, MD  [facility] Adult PT Treatment/Exercise - 09/03/20 0001      Lumbar Exercises: Stretches   Lower Trunk Rotation Limitations 2 x 20     Prone on Elbows Stretch Limitations 2 x 15   cues to keep head in neutral pos.   Figure 4 Stretch 30 seconds;Supine;3 reps   RLE only with manual overpressure     Lumbar Exercises: Supine   Bent Knee Raise --   2 x 12 reps keeping core tight throughout exercise.     Knee/Hip Exercises: Supine   Bridges 2 sets;15 reps;Both;Strengthening      Knee/Hip Exercises: Sidelying   Hip ABduction 2 sets;Strengthening;Both   12 reps     Manual Therapy   Manual therapy comments sub-occipital  release and bil upper trap MTPR                  PT Education - 09/03/20 1539    Education Details reviewed FOTO assessment. reviewed HEP and updated today for lower trunk rotation. discussed importance of doing exercises consistently to get the most out of treatment    Person(s) Educated Patient    Methods Explanation    Comprehension Verbalized understanding            PT Short Term Goals - 08/19/20 1645      PT SHORT TERM GOAL #1   Title pt to be I with inital HEP    Baseline -    Time 3    Period Weeks    Status New    Target Date 09/09/20      PT SHORT TERM GOAL #2   Title pt to verbalize/ demo efficient posture and lifting mechancis to reduce and prevent low back pain    Baseline -    Time 3    Period Weeks    Status New    Target Date 10/14/20      PT SHORT TERM GOAL #3   Title -    Baseline -             PT Long Term Goals - 08/19/20 1645      PT LONG TERM GOAL #1   Title increase bil gross hip strength to >/= 4+/5 to promote hip stability and maximize efficient posture.    Time 6    Period Weeks    Status New    Target Date 09/30/20      PT LONG TERM GOAL #2   Title pt to report no RLE referral for >/= 1 week for functional improvement and QOL    Time 6    Period Weeks    Status New    Target Date 09/30/20      PT LONG TERM GOAL #3   Title pt to be able to walk/ standing for >/= 45 min  </= 2/10 pain for functional endurance required for community ambulation    Time 6    Period Weeks    Status New    Target Date 09/30/20      PT LONG TERM GOAL #4   Title pt to increase FOTO score to </= 45% limited to demo improvement in function    Time 6    Period Weeks    Status New    Target Date 09/30/20      PT LONG TERM GOAL #5   Title pt to be IND with all HEP given to maintain and progress current LOF IND    Time 6    Period  Weeks    Status New    Target Date 09/30/20                 Plan - 09/03/20 1528    Clinical  Impression Statement pt reports limited compliance with her HEP but does continue to report centralization with extension exercises but noted increased neck pain with the exercise. reviewed extension exercise avoiding cervical extension to reduce muscle overaction. she responded well to [facility] for bil upper trap and sub-occipital release. reviewed HEP which she required intermittent verbal/ tactile cues for proper. end of session she reported decreased pain and RLE referral.    PT Treatment/Interventions ADLs/Self Care Home Management;Cryotherapy;Electrical Stimulation;Iontophoresis 4mg /ml Dexamethasone;Moist Heat;Ultrasound;Traction;Therapeutic exercise;Balance training;Therapeutic activities;Functional mobility training;Neuromuscular re-education;Patient/family education;Manual techniques;Passive range of motion;Dry needling;Taping    PT Next Visit Plan review/ update HEP PRN,  if continued to tolerate prone extension with centralization, progress as able. gross hip/ core strengthening and posture education    PT Home Exercise Plan [id number] - prone on elbows, supine marching, bridge, sidelying hip abduction    Consulted and Agree with Plan of Care Patient           Patient will benefit from skilled therapeutic intervention in order to improve the following deficits and impairments:  Improper body mechanics, Increased muscle spasms, Decreased strength, Abnormal gait, Obesity, Pain, Decreased activity tolerance, Decreased balance, Decreased endurance, Postural dysfunction  Visit Diagnosis: Chronic bilateral low back pain with right-sided sciatica  Other abnormalities of gait and mobility  Muscle weakness (generalized)  Muscle spasm of back     Problem List Patient Active Problem List   Diagnosis Date Noted  . Chronic bilateral low back pain 05/16/2020  . Postoperative state 12/27/2019  . Iron deficiency 08/03/2019  . B12 deficiency 06/30/2019  . Chronic right shoulder pain 07/05/2018  .  Trochanteric bursitis of left hip 07/05/2018  . Morbid obesity ([place]) 07/05/2018  . Body mass index 45.0-49.9, adult ([place]) 07/05/2018  . Marijuana user 05/26/2018  . Status post total right knee replacement 01/12/2018  . Status post total hip replacement, left 09/22/2017  . Hyperlipidemia 08/10/2017  . Primary osteoarthritis of left hip 06/25/2017  . Primary osteoarthritis of right knee 06/25/2017  . Mild depression ([place]) 06/25/2017  . Essential hypertension 06/25/2017  . Tobacco abuse 06/25/2017  . Prediabetes 06/25/2017  . OSA on CPAP 06/25/2017  . Bronchitis 09/24/2016  . Leukocytosis 09/24/2016  . Radiculopathy, lumbar region 07/16/2016  . Degenerative lumbar disc 07/03/2016  . Synovitis of hip 04/20/2016  . Screening for breast cancer 12/28/2014  . Pain in joint involving lower leg 05/13/2013   Starr Lake PT, DPT, LAT, ATC  09/03/20  3:44 PM      [facility] [address] [place], [place], [address] Phone: [phone]   Fax:  [phone]  Name: CANDIES PALM MRN: [id number] Date of Birth: 06-07-1966

## 2020-09-05 ENCOUNTER — Ambulatory Visit: Payer: Medicare Other | Admitting: Physical Therapy

## 2020-09-05 ENCOUNTER — Other Ambulatory Visit: Payer: Self-pay

## 2020-09-05 DIAGNOSIS — R2689 Other abnormalities of gait and mobility: Secondary | ICD-10-CM

## 2020-09-05 DIAGNOSIS — G8929 Other chronic pain: Secondary | ICD-10-CM

## 2020-09-05 DIAGNOSIS — M6281 Muscle weakness (generalized): Secondary | ICD-10-CM

## 2020-09-05 DIAGNOSIS — M5441 Lumbago with sciatica, right side: Secondary | ICD-10-CM | POA: Diagnosis not present

## 2020-09-05 DIAGNOSIS — M62830 Muscle spasm of back: Secondary | ICD-10-CM

## 2020-09-05 NOTE — Patient Instructions (Signed)

## 2020-09-05 NOTE — Therapy (Signed)
[place], [place], [address] Phone: [phone]   Fax:  [phone]  Physical Therapy Treatment  Patient Details  Name: Sarah Randall MRN: [id number] Date of Birth: [birth year] Referring Provider (PT):  Vallarie Mare, MD    Encounter Date: 09/05/2020   PT End of Session - 09/05/20 1500    Visit Number 3    Number of Visits 13    Date for PT Re-Evaluation 09/30/20    Authorization Type MCR - KX mod 15th visit    Progress Note Due on Visit 10    PT Start Time 1500    PT Stop Time 1540    PT Time Calculation (min) 40 min    Activity Tolerance Patient tolerated treatment well    Behavior During Therapy [facility] for tasks assessed/performed           Past Medical History:  Diagnosis Date  . Anemia   . Anxiety   . Arthritis   . B12 deficiency 06/30/2019  . Depression   . GERD (gastroesophageal reflux disease)    occasionally takes OTC  . Hyperlipidemia   . Hypertension   . Pre-diabetes   . Sleep apnea    tested 2014 - unable to tolerate machine    Past Surgical History:  Procedure Laterality Date  . LEFT HEART CATH AND CORONARY ANGIOGRAPHY N/A 10/25/2018   Procedure: LEFT HEART CATH AND CORONARY ANGIOGRAPHY;  Surgeon: Martinique, Peter M, MD;  Location: [place] [facility];  Service: Cardiovascular;  Laterality: N/A;  . ROBOTIC ASSISTED LAPAROSCOPIC HYSTERECTOMY AND SALPINGECTOMY Bilateral 12/27/2019   Procedure: XI ROBOTIC ASSISTED TOTAL LAPAROSCOPIC HYSTERECTOMY AND SALPINGECTOMY;  Surgeon: Princess Bruins, MD;  Location: [place];  Service: Gynecology;  Laterality: Bilateral;  . TOTAL HIP ARTHROPLASTY Left 09/22/2017   Procedure: LEFT TOTAL HIP ARTHROPLASTY ANTERIOR APPROACH;  Surgeon: Leandrew Koyanagi, MD;  Location: [place];  Service: Orthopedics;  Laterality: Left;  . TOTAL KNEE ARTHROPLASTY Right 01/12/2018   Procedure: RIGHT TOTAL KNEE ARTHROPLASTY;  Surgeon: Leandrew Koyanagi, MD;   Location: [place];  Service: Orthopedics;  Laterality: Right;  . TUBAL LIGATION      There were no vitals filed for this visit.   Subjective Assessment - 09/05/20 1501    Subjective "I have been doing pretty good today. I did my exerciess and I feel like I am doing pretty."    Currently in Pain? Yes    Pain Score 4     Pain Orientation Left    Pain Descriptors / Indicators Aching    Pain Type Chronic pain              [facility] PT Assessment - 09/05/20 0001      Assessment   Medical Diagnosis Spondylolisthesis, lumbar region M43.16    Referring Provider (PT)  Vallarie Mare, MD                          [facility] Adult PT Treatment/Exercise - 09/05/20 0001      Self-Care   Self-Care Posture    Posture efficient posture and lifting mechanics with associated handout      Lumbar Exercises: Aerobic   Nustep L5 x 6 min UE/LE   maintaining MET between 2-3     Lumbar Exercises: Standing   Other Standing Lumbar Exercises repeated standing trunk extension 2 x 15   avoiding extreme end range     Lumbar  Exercises: Supine   Other Supine Lumbar Exercises pushing down with bil UE onto ball against legs 2 x 10 exhaling with UE press, inhaling with release.       Knee/Hip Exercises: Standing   Hip Flexion Stengthening;Both;2 sets;20 reps   alternating L/R focusing on controlled lowering   Hip Abduction Stengthening;2 sets;10 reps;Knee straight    Wall Squat 2 sets;10 reps                  PT Education - 09/05/20 1542    Education Details Posture education and lifting mechanics    Person(s) Educated Patient    Methods Explanation;Verbal cues;Handout    Comprehension Verbalized understanding;Verbal cues required            PT Short Term Goals - 08/19/20 1645      PT SHORT TERM GOAL #1   Title pt to be I with inital HEP    Baseline -    Time 3    Period Weeks    Status New    Target Date 09/09/20      PT SHORT TERM GOAL #2   Title pt to verbalize/  demo efficient posture and lifting mechancis to reduce and prevent low back pain    Baseline -    Time 3    Period Weeks    Status New    Target Date 10/14/20      PT SHORT TERM GOAL #3   Title -    Baseline -             PT Long Term Goals - 08/19/20 1645      PT LONG TERM GOAL #1   Title increase bil gross hip strength to >/= 4+/5 to promote hip stability and maximize efficient posture.    Time 6    Period Weeks    Status New    Target Date 09/30/20      PT LONG TERM GOAL #2   Title pt to report no RLE referral for >/= 1 week for functional improvement and QOL    Time 6    Period Weeks    Status New    Target Date 09/30/20      PT LONG TERM GOAL #3   Title pt to be able to walk/ standing for >/= 45 min  </= 2/10 pain for functional endurance required for community ambulation    Time 6    Period Weeks    Status New    Target Date 09/30/20      PT LONG TERM GOAL #4   Title pt to increase FOTO score to </= 45% limited to demo improvement in function    Time 6    Period Weeks    Status New    Target Date 09/30/20      PT LONG TERM GOAL #5   Title pt to be IND with all HEP given to maintain and progress current LOF IND    Time 6    Period Weeks    Status New    Target Date 09/30/20                 Plan - 09/05/20 1540    Clinical Impression Statement Mrs Roes arrives reporting increased consistency with her HEP and additionally notes reduce pain and LE referral. continued exension biased in standing avoiding extreme end range, and hip strengthening in [facility] today which she did well but does fatigue quickly. She did well with therapy  today with no report of pain following session.    PT Treatment/Interventions ADLs/Self Care Home Management;Cryotherapy;Electrical Stimulation;Iontophoresis 58m/ml Dexamethasone;Moist Heat;Ultrasound;Traction;Therapeutic exercise;Balance training;Therapeutic activities;Functional mobility training;Neuromuscular  re-education;Patient/family education;Manual techniques;Passive range of motion;Dry needling;Taping    PT Next Visit Plan review/ update HEP PRN,  if continued to tolerate prone extension with centralization, progress as able. gross hip/ core strengthening    PT Home Exercise Plan [id number] - prone on elbows, supine marching, bridge, sidelying hip abduction, posture enducation           Patient will benefit from skilled therapeutic intervention in order to improve the following deficits and impairments:  Improper body mechanics, Increased muscle spasms, Decreased strength, Abnormal gait, Obesity, Pain, Decreased activity tolerance, Decreased balance, Decreased endurance, Postural dysfunction  Visit Diagnosis: Chronic bilateral low back pain with right-sided sciatica  Other abnormalities of gait and mobility  Muscle weakness (generalized)  Muscle spasm of back     Problem List Patient Active Problem List   Diagnosis Date Noted  . Chronic bilateral low back pain 05/16/2020  . Postoperative state 12/27/2019  . Iron deficiency 08/03/2019  . B12 deficiency 06/30/2019  . Chronic right shoulder pain 07/05/2018  . Trochanteric bursitis of left hip 07/05/2018  . Morbid obesity ([place] 07/05/2018  . Body mass index 45.0-49.9, adult ([place] 07/05/2018  . Marijuana user 05/26/2018  . Status post total right knee replacement 01/12/2018  . Status post total hip replacement, left 09/22/2017  . Hyperlipidemia 08/10/2017  . Primary osteoarthritis of left hip 06/25/2017  . Primary osteoarthritis of right knee 06/25/2017  . Mild depression ([place] 06/25/2017  . Essential hypertension 06/25/2017  . Tobacco abuse 06/25/2017  . Prediabetes 06/25/2017  . OSA on CPAP 06/25/2017  . Bronchitis 09/24/2016  . Leukocytosis 09/24/2016  . Radiculopathy, lumbar region 07/16/2016  . Degenerative lumbar disc 07/03/2016  . Synovitis of hip 04/20/2016  . Screening for breast cancer 12/28/2014  . Pain in joint  involving lower leg 05/13/2013   KStarr LakePT, DPT, LAT, ATC  09/05/20  3:45 PM      [facility] [address]Phone: [phone]  Fax:  [phone] Name: Sarah PEFFLEYMRN: [id number] of Birth: [birth year]

## 2020-09-06 ENCOUNTER — Other Ambulatory Visit: Payer: Self-pay

## 2020-09-06 ENCOUNTER — Emergency Department (HOSPITAL_COMMUNITY): Payer: Medicare Other

## 2020-09-06 ENCOUNTER — Emergency Department (HOSPITAL_COMMUNITY)
Admission: EM | Admit: 2020-09-06 | Discharge: 2020-09-06 | Disposition: A | Discharge status: Home / Self Care | Payer: Medicare Other | Attending: Emergency Medicine | Admitting: Emergency Medicine

## 2020-09-06 ENCOUNTER — Encounter (HOSPITAL_COMMUNITY): Payer: Self-pay | Admitting: *Deleted

## 2020-09-06 DIAGNOSIS — I10 Essential (primary) hypertension: Secondary | ICD-10-CM | POA: Diagnosis not present

## 2020-09-06 DIAGNOSIS — R079 Chest pain, unspecified: Secondary | ICD-10-CM

## 2020-09-06 DIAGNOSIS — F17210 Nicotine dependence, cigarettes, uncomplicated: Secondary | ICD-10-CM | POA: Insufficient documentation

## 2020-09-06 DIAGNOSIS — Z96651 Presence of right artificial knee joint: Secondary | ICD-10-CM | POA: Diagnosis not present

## 2020-09-06 DIAGNOSIS — Z79899 Other long term (current) drug therapy: Secondary | ICD-10-CM | POA: Insufficient documentation

## 2020-09-06 DIAGNOSIS — Z96642 Presence of left artificial hip joint: Secondary | ICD-10-CM | POA: Insufficient documentation

## 2020-09-06 LAB — TROPONIN I (HIGH SENSITIVITY)
Troponin I (High Sensitivity): 5 ng/L (ref <18)
Troponin I (High Sensitivity): 4 ng/L (ref <18)

## 2020-09-06 LAB — CBC
HCT: 44.8 % (ref 36.0–46.0)
Hemoglobin: 14.7 g/dL (ref 12.0–15.0)
MCH: 30.9 pg (ref 26.0–34.0)
MCHC: 32.8 g/dL (ref 30.0–36.0)
MCV: 94.3 fL (ref 80.0–100.0)
Platelets: 321 10*3/uL (ref 150–400)
RBC: 4.75 MIL/uL (ref 3.87–5.11)
RDW: 13.6 % (ref 11.5–15.5)
WBC: 13.1 10*3/uL — ABNORMAL HIGH (ref 4.0–10.5)
nRBC: 0 % (ref 0.0–0.2)

## 2020-09-06 LAB — BASIC METABOLIC PANEL
Anion gap: 12 (ref 5–15)
BUN: 12 mg/dL (ref 6–20)
CO2: 25 mmol/L (ref 22–32)
Calcium: 8.9 mg/dL (ref 8.9–10.3)
Chloride: 99 mmol/L (ref 98–111)
Creatinine, Ser: 1.06 mg/dL — ABNORMAL HIGH (ref 0.44–1.00)
GFR calc Af Amer: >60 mL/min (ref >60)
GFR calc non Af Amer: 60 mL/min — ABNORMAL LOW (ref >60)
Glucose, Bld: 151 mg/dL — ABNORMAL HIGH (ref 70–99)
Potassium: 3.8 mmol/L (ref 3.5–5.1)
Sodium: 136 mmol/L (ref 135–145)

## 2020-09-06 IMAGING — CR DG CHEST 2V
2 series · 2 of 2 positions shown · non-contrast
Comparison: [DATE]

CLINICAL DATA: Central chest pain since 1 a.m. Pain radiates to
the right arm.

EXAM:
CHEST - 2 VIEW

[chest lat]
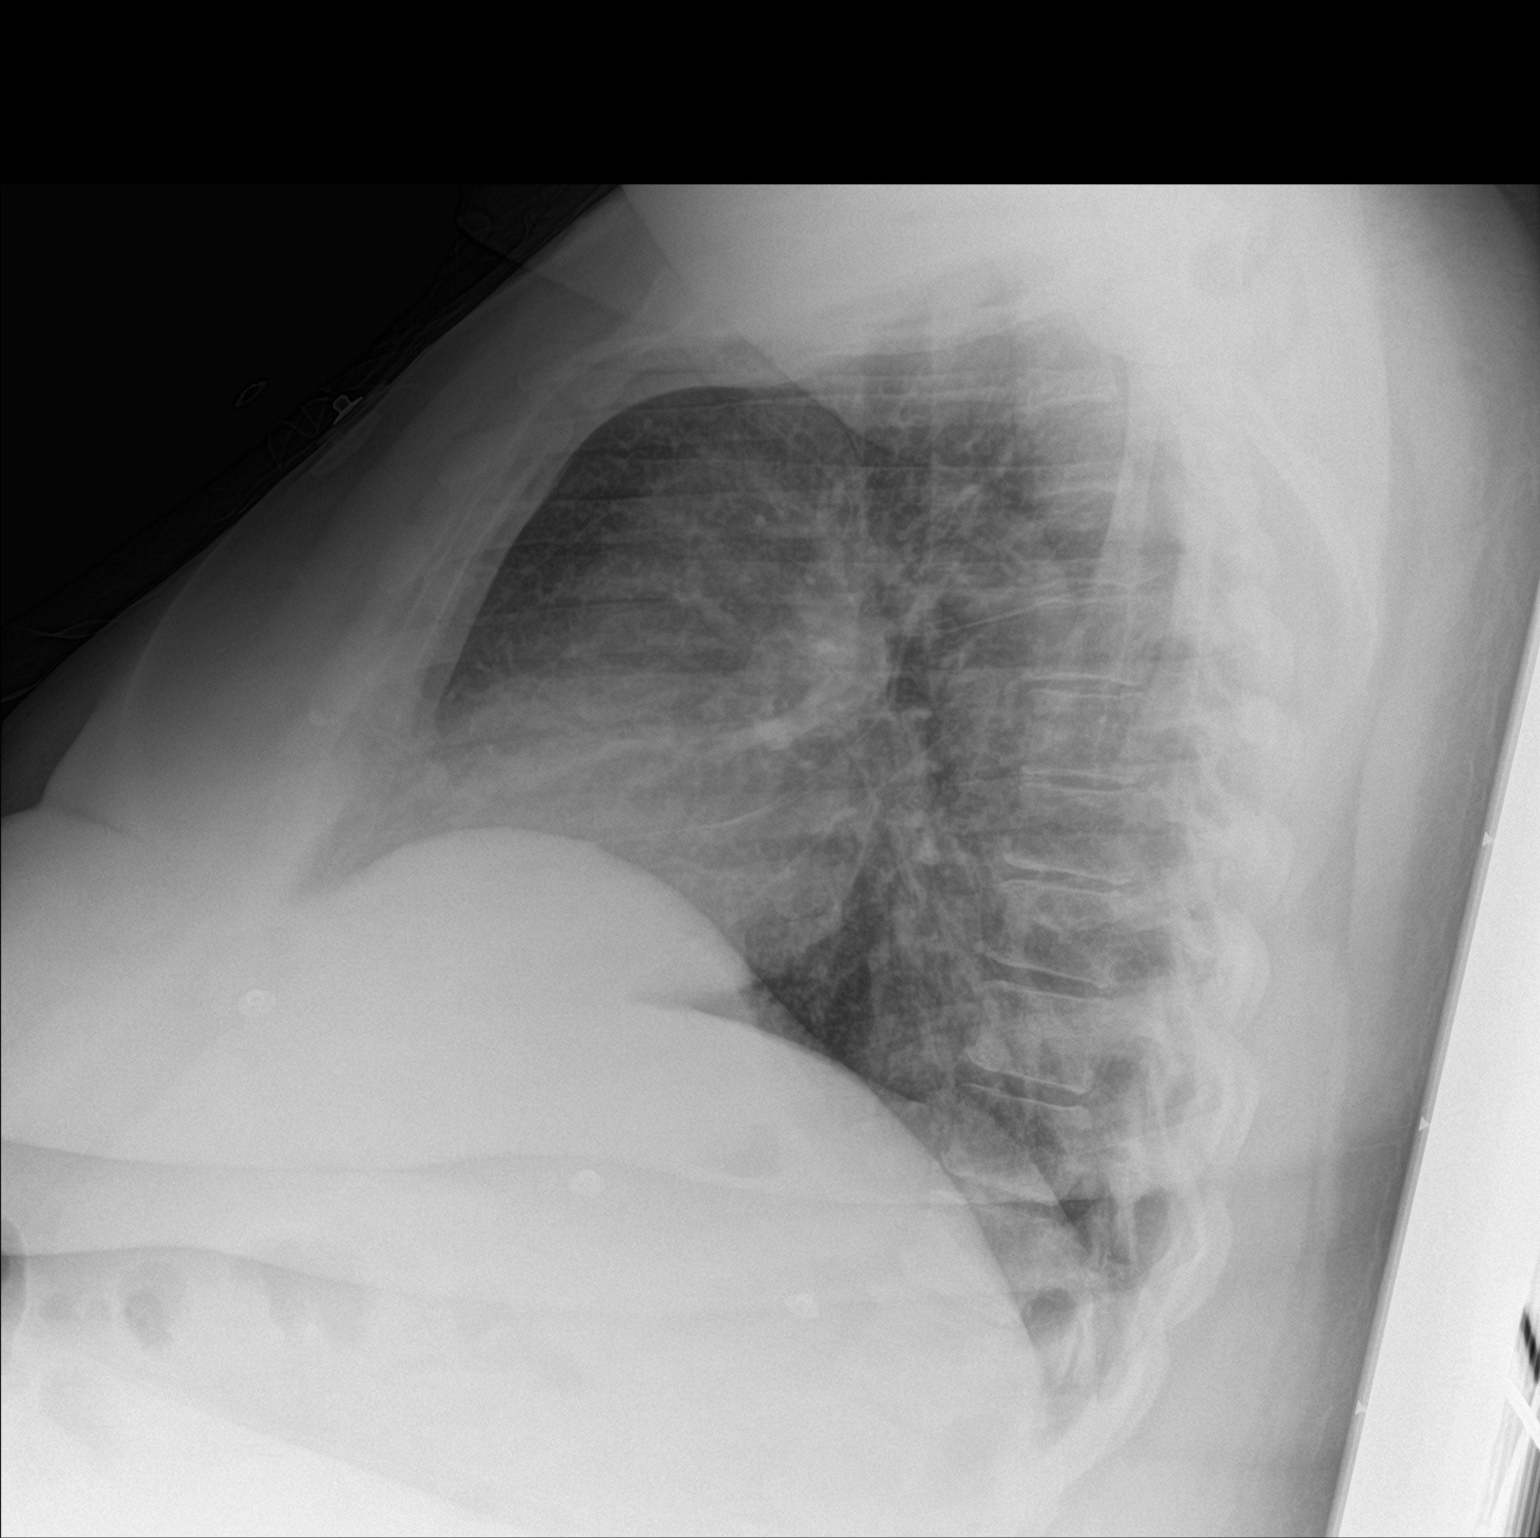

[chest ap]
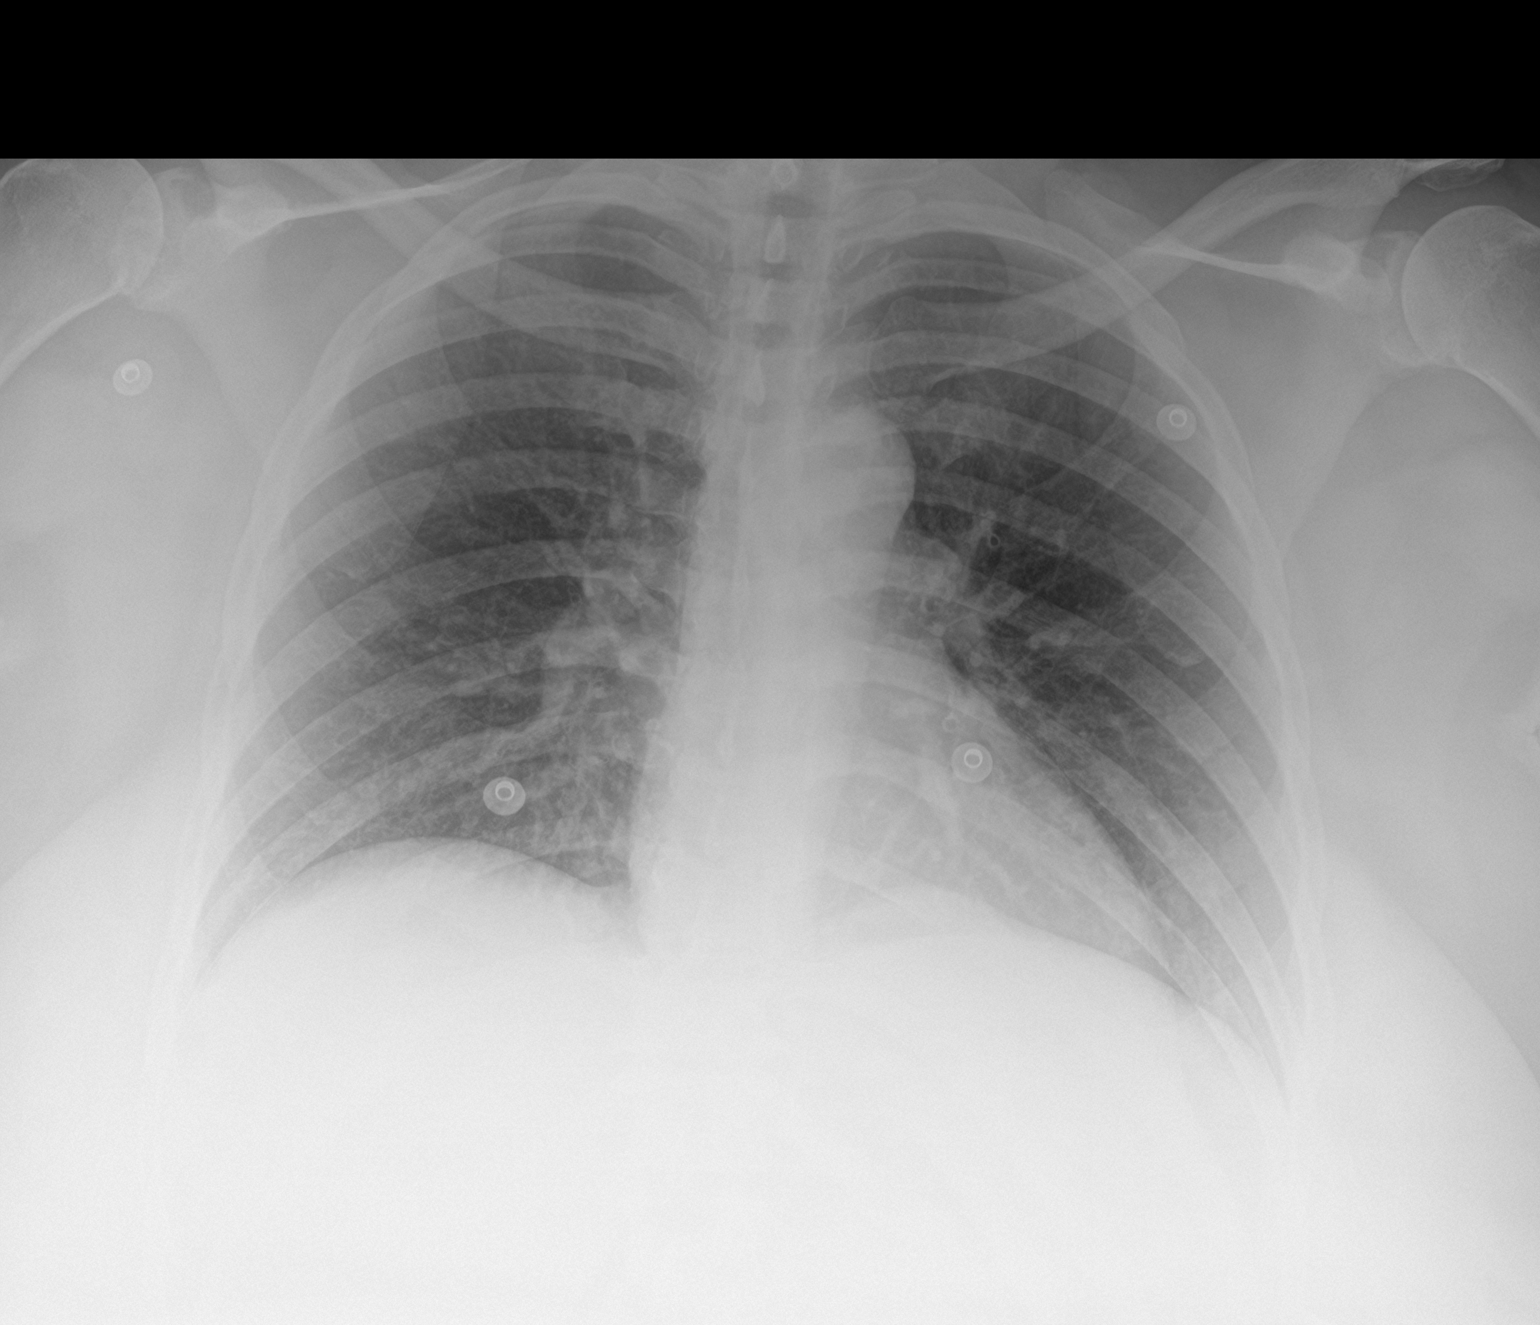

[2 of 2 positions shown; findings below may reference images not displayed]

FINDINGS: Shallow inspiration. Normal heart size and pulmonary vascularity. No
focal airspace disease or consolidation in the lungs. No blunting of
costophrenic angles. No pneumothorax. Mediastinal contours appear
intact.
IMPRESSION: No active cardiopulmonary disease.

## 2020-09-06 MED ORDER — OXYCODONE-ACETAMINOPHEN 5-325 MG PO TABS
1.0000 | ORAL_TABLET | Freq: Once | ORAL | Status: AC
  Administered 2020-09-06: 1 via ORAL
  Filled 2020-09-06: qty 1

## 2020-09-06 MED ORDER — CYCLOBENZAPRINE HCL 10 MG PO TABS
10.0000 mg | ORAL_TABLET | Freq: Once | ORAL | Status: AC
  Administered 2020-09-06: 10 mg via ORAL
  Filled 2020-09-06: qty 1

## 2020-09-06 NOTE — Discharge Instructions (Signed)
Follow-up with your primary doctor regarding your symptoms from today.  Take Tylenol Motrin as needed.  Return if you develop worsening chest pain, difficulty breathing or other new concerns symptom.

## 2020-09-06 NOTE — ED Triage Notes (Signed)
To ED via [facility], from home, for eval of mid-sternal cp that woke her out of her sleep around 0100. Pt took pepto without relief. Pain increases with movement of right arm and/or a deep breath. No nausea or vomiting. States she felt fine prior to going to bed. States she did go to physical therapy yesterday but didn't do anything out of ordinary for her.

## 2020-09-06 NOTE — ED Provider Notes (Addendum)
[facility] EMERGENCY DEPARTMENT Provider Note   CSN: [id number] Arrival date & time: 09/06/20  0557     History Chief Complaint  Patient presents with  . Chest Pain    Sarah Randall is a 54 y.o. female.  Presented to ER with concern for chest pain.  She reports last night she was in her normal state of health, no symptoms.  She woke up around 1 AM with sensation of chest pain, worse on the right side radiating to her right arm.  Pain was worse with certain movements of her right arm as well as taking a deep breath.  The more severe pain lasted for about an hour and then steadily subsided.  Now has very mild achy sensation.  No shortness of breath.  HPI     Past Medical History:  Diagnosis Date  . Anemia   . Anxiety   . Arthritis   . B12 deficiency 06/30/2019  . Depression   . GERD (gastroesophageal reflux disease)    occasionally takes OTC  . Hyperlipidemia   . Hypertension   . Pre-diabetes   . Sleep apnea    tested 2014 - unable to tolerate machine    Patient Active Problem List   Diagnosis Date Noted  . Chronic bilateral low back pain 05/16/2020  . Postoperative state 12/27/2019  . Iron deficiency 08/03/2019  . B12 deficiency 06/30/2019  . Chronic right shoulder pain 07/05/2018  . Trochanteric bursitis of left hip 07/05/2018  . Morbid obesity ([place]) 07/05/2018  . Body mass index 45.0-49.9, adult ([place]) 07/05/2018  . Marijuana user 05/26/2018  . Status post total right knee replacement 01/12/2018  . Status post total hip replacement, left 09/22/2017  . Hyperlipidemia 08/10/2017  . Primary osteoarthritis of left hip 06/25/2017  . Primary osteoarthritis of right knee 06/25/2017  . Mild depression ([place]) 06/25/2017  . Essential hypertension 06/25/2017  . Tobacco abuse 06/25/2017  . Prediabetes 06/25/2017  . OSA on CPAP 06/25/2017  . Bronchitis 09/24/2016  . Leukocytosis 09/24/2016  . Radiculopathy, lumbar region 07/16/2016  . Degenerative  lumbar disc 07/03/2016  . Synovitis of hip 04/20/2016  . Screening for breast cancer 12/28/2014  . Pain in joint involving lower leg 05/13/2013    Past Surgical History:  Procedure Laterality Date  . LEFT HEART CATH AND CORONARY ANGIOGRAPHY N/A 10/25/2018   Procedure: LEFT HEART CATH AND CORONARY ANGIOGRAPHY;  Surgeon: Martinique, Peter M, MD;  Location: [facility];  Service: Cardiovascular;  Laterality: N/A;  . ROBOTIC ASSISTED LAPAROSCOPIC HYSTERECTOMY AND SALPINGECTOMY Bilateral 12/27/2019   Procedure: XI ROBOTIC ASSISTED TOTAL LAPAROSCOPIC HYSTERECTOMY AND SALPINGECTOMY;  Surgeon: Princess Bruins, MD;  Location: [facility];  Service: Gynecology;  Laterality: Bilateral;  . TOTAL HIP ARTHROPLASTY Left 09/22/2017   Procedure: LEFT TOTAL HIP ARTHROPLASTY ANTERIOR APPROACH;  Surgeon: Leandrew Koyanagi, MD;  Location: [facility];  Service: Orthopedics;  Laterality: Left;  . TOTAL KNEE ARTHROPLASTY Right 01/12/2018   Procedure: RIGHT TOTAL KNEE ARTHROPLASTY;  Surgeon: Leandrew Koyanagi, MD;  Location: [facility];  Service: Orthopedics;  Laterality: Right;  . TUBAL LIGATION       OB History    Gravida  2   Para  0   Term  0   Preterm  0   AB  1   Living  2     SAB  1   TAB  0   Ectopic  0   Multiple  0   Live Births  0           Family History  Problem Relation Age of Onset  . Hypertension Mother   . Diabetes Mother   . Breast cancer Mother 65  . Hypertension Father   . Lung cancer Maternal Aunt   . Colon polyps Brother   . Colon cancer Neg Hx   . Esophageal cancer Neg Hx   . Rectal cancer Neg Hx   . Stomach cancer Neg Hx     Social History   Tobacco Use  . Smoking status: Current Some Day Smoker    Packs/day: 0.25    Years: 30.00    Pack years: 7.50    Types: Cigarettes  . Smokeless tobacco: Never Used  Vaping Use  . Vaping Use: Never used  Substance Use Topics  . Alcohol use: Yes    Alcohol/week: 4.0 - 6.0 standard drinks    Types: 4 - 6  Shots of liquor per week    Comment: occasional  . Drug use: Yes    Types: Marijuana    Comment: smoke 1 joint cannibis 1 week ago.    Home Medications Prior to Admission medications   Medication Sig Start Date End Date Taking? Authorizing Provider  acetaminophen-codeine (TYLENOL #3) 300-30 MG tablet Take 1 tablet by mouth 3 (three) times daily as needed for moderate pain. Patient not taking: Reported on 08/19/2020 12/26/19   Aundra Dubin, PA-C  cyclobenzaprine (FLEXERIL) 10 MG tablet Take 1 tablet (10 mg total) by mouth 2 (two) times daily as needed for muscle spasms. 03/09/20   Albrizze, Kaitlyn E, PA-C  esomeprazole (NEXIUM) 40 MG capsule TAKE 1 CAPSULE BY MOUTH EVERY DAY 07/03/20   Ladell Pier, MD  fluticasone [facility]) 50 MCG/ACT nasal spray Place 1 spray into both nostrils daily. Place 1 spray into both nostrils daily as needed. Patient not taking: Reported on 08/19/2020 01/04/20   Camillia Herter, NP  hydrochlorothiazide (HYDRODIURIL) 12.5 MG tablet TAKE 1 TABLET BY MOUTH EVERY DAY 07/07/20   Ladell Pier, MD  loratadine (CLARITIN) 10 MG tablet TAKE 1 TABLET BY MOUTH EVERY DAY 01/26/20   Ladell Pier, MD  losartan (COZAAR) 50 MG tablet TAKE 1 TABLET BY MOUTH EVERY DAY 08/26/20   Ladell Pier, MD  nitroGLYCERIN (NITROSTAT) 0.4 MG SL tablet Place 1 tablet (0.4 mg total) under the tongue every 5 (five) minutes as needed. 10/21/18 12/22/19  Revankar, Reita Cliche, MD  pravastatin (PRAVACHOL) 20 MG tablet 1 tab Q Mon and Fri Patient not taking: Reported on 08/19/2020 08/03/19   Ladell Pier, MD  propranolol (INDERAL) 10 MG tablet Take 1 tablet (10 mg total) by mouth 2 (two) times daily. 08/03/19   Ladell Pier, MD    Allergies    Lipitor [atorvastatin] and Zoloft [sertraline hcl]  Review of Systems   Review of Systems  Constitutional: Negative for chills and fever.  HENT: Negative for ear pain and sore throat.   Eyes: Negative for pain and visual disturbance.    Respiratory: Negative for cough and shortness of breath.   Cardiovascular: Positive for chest pain. Negative for palpitations.  Gastrointestinal: Negative for abdominal pain and vomiting.  Genitourinary: Negative for dysuria and hematuria.  Musculoskeletal: Negative for arthralgias and back pain.  Skin: Negative for color change and rash.  Neurological: Negative for seizures and syncope.  All other systems reviewed and are negative.   Physical Exam Updated Vital Signs BP 125/80   Pulse 68   Temp  98.3 F (36.8 C) (Oral)   Resp 17   LMP  (LMP Unknown)   SpO2 99%   Physical Exam Vitals and nursing note reviewed.  Constitutional:      General: She is not in acute distress.    Appearance: She is well-developed.  HENT:     Head: Normocephalic and atraumatic.  Eyes:     Conjunctiva/sclera: Conjunctivae normal.  Cardiovascular:     Rate and Rhythm: Normal rate and regular rhythm.     Heart sounds: No murmur heard.   Pulmonary:     Effort: Pulmonary effort is normal. No respiratory distress.     Breath sounds: Normal breath sounds.  Abdominal:     Palpations: Abdomen is soft.     Tenderness: There is no abdominal tenderness.  Musculoskeletal:     Cervical back: Neck supple.     Right lower leg: No edema.     Left lower leg: No edema.  Skin:    General: Skin is warm and dry.  Neurological:     Mental Status: She is alert.     ED Results / Procedures / Treatments   Labs (all labs ordered are listed, but only abnormal results are displayed) Labs Reviewed  BASIC METABOLIC PANEL - Abnormal; Notable for the following components:      Result Value   Glucose, Bld 151 (*)    Creatinine, Ser 1.06 (*)    GFR calc non Af Amer 60 (*)    All other components within normal limits  CBC - Abnormal; Notable for the following components:   WBC 13.1 (*)    All other components within normal limits  TROPONIN I (HIGH SENSITIVITY)  TROPONIN I (HIGH SENSITIVITY)    EKG EKG  Interpretation  Date/Time:  Friday September 06 2020 05:52:16 EDT Ventricular Rate:  79 PR Interval:  150 QRS Duration: 72 QT Interval:  386 QTC Calculation: 442 R Axis:   12 Text Interpretation: Normal sinus rhythm Cannot rule out Anterior infarct , age undetermined Abnormal ECG Confirmed by Madalyn Rob [phone]) on 09/06/2020 8:09:30 AM   Radiology DG Chest 2 View  Result Date: 09/06/2020 CLINICAL DATA:  Central chest pain since 1 a.m. Pain radiates to the right arm. EXAM: CHEST - 2 VIEW COMPARISON:  10/21/2018 FINDINGS: Shallow inspiration. Normal heart size and pulmonary vascularity. No focal airspace disease or consolidation in the lungs. No blunting of costophrenic angles. No pneumothorax. Mediastinal contours appear intact. IMPRESSION: No active cardiopulmonary disease. Electronically Signed   By: Lucienne Capers M.D.   On: 09/06/2020 06:27    Procedures Procedures (including critical care time)  Medications Ordered in ED Medications  oxyCODONE-acetaminophen (PERCOCET/ROXICET) 5-325 MG per tablet 1 tablet (1 tablet Oral Given 09/06/20 0846)  cyclobenzaprine (FLEXERIL) tablet 10 mg (10 mg Oral Given 09/06/20 0845)    ED Course  I have reviewed the triage vital signs and the nursing notes.  Pertinent labs & imaging results that were available during my care of the patient were reviewed by me and considered in my medical decision making (see chart for details).    MDM Rules/Calculators/A&P                         54 year old lady who presented to ER with concern for chest pain.  On my assessment patient is well-appearing with stable vital signs.  She has no tachypnea, hypoxia or tach tachycardia.  EKG without ischemic changes, troponin x2 within normal limits, doubt ACS.  CXR negative.  Remainder of labs within normal limits.  Suspect MSK strain.  Given reassuring work-up, believe she is appropriate for discharge and outpatient management.  Recommended that she have follow-up  with primary doctor.  Reviewed return precautions.    After the discussed management above, the patient was determined to be safe for discharge.  The patient was in agreement with this plan and all questions regarding their care were answered.  ED return precautions were discussed and the patient will return to the ED with any significant worsening of condition.    Final Clinical Impression(s) / ED Diagnoses Final diagnoses:  Chest pain, unspecified type    Rx / DC Orders ED Discharge Orders    None       Lucrezia Starch, MD 09/06/20 1140    Lucrezia Starch, MD 09/18/20 [phone]

## 2020-09-10 ENCOUNTER — Ambulatory Visit: Payer: Medicare Other | Admitting: Physical Therapy

## 2020-09-11 ENCOUNTER — Other Ambulatory Visit: Payer: Self-pay | Admitting: Internal Medicine

## 2020-09-11 NOTE — Telephone Encounter (Signed)
Requested Prescriptions  Pending Prescriptions Disp Refills  . hydrochlorothiazide (HYDRODIURIL) 12.5 MG tablet [Pharmacy Med Name: HYDROCHLOROTHIAZIDE 12.5 MG TB] 10 tablet 0    Sig: TAKE 1 TABLET BY MOUTH EVERY DAY     Cardiovascular: Diuretics - Thiazide Failed - 09/11/2020  1:12 AM      Failed - Cr in normal range and within 360 days    Creatinine  Date Value Ref Range Status  06/15/2019 0.94 0.44 - 1.00 mg/dL Final  08/10/2017 150.1 20.0 - 300.0 mg/dL Final   Creatinine, Ser  Date Value Ref Range Status  09/06/2020 1.06 (H) 0.44 - 1.00 mg/dL Final         Failed - Last BP in normal range    BP Readings from Last 1 Encounters:  09/06/20 140/69         Failed - Valid encounter within last 6 months    Recent Outpatient Visits          8 months ago Rhinovirus   [place], [place], NP   9 months ago [place], Deborah B, MD   1 year ago Symptomatic hypotension   [place], Deborah B, MD   1 year ago Essential hypertension   [place], Deborah B, MD   1 year ago Prediabetes   [place], Charlane Ferretti, MD      Future Appointments            In 5 days Ladell Pier, MD [place] in normal range and within 360 days    Calcium  Date Value Ref Range Status  09/06/2020 8.9 8.9 - 10.3 mg/dL Final         Passed - K in normal range and within 360 days    Potassium  Date Value Ref Range Status  09/06/2020 3.8 3.5 - 5.1 mmol/L Final         Passed - Na in normal range and within 360 days    Sodium  Date Value Ref Range Status  09/06/2020 136 135 - 145 mmol/L Final  06/20/2019 138 134 - 144 mmol/L Final         # 10 tablets provided to get patient to her next appt on 09/16/2020.

## 2020-09-12 ENCOUNTER — Ambulatory Visit: Payer: Medicare Other | Attending: Neurosurgery | Admitting: Physical Therapy

## 2020-09-12 ENCOUNTER — Encounter: Payer: Self-pay | Admitting: Physical Therapy

## 2020-09-12 ENCOUNTER — Other Ambulatory Visit: Payer: Self-pay

## 2020-09-12 DIAGNOSIS — M6281 Muscle weakness (generalized): Secondary | ICD-10-CM | POA: Diagnosis present

## 2020-09-12 DIAGNOSIS — M62830 Muscle spasm of back: Secondary | ICD-10-CM | POA: Insufficient documentation

## 2020-09-12 DIAGNOSIS — M5441 Lumbago with sciatica, right side: Secondary | ICD-10-CM

## 2020-09-12 DIAGNOSIS — R2689 Other abnormalities of gait and mobility: Secondary | ICD-10-CM | POA: Insufficient documentation

## 2020-09-12 DIAGNOSIS — G8929 Other chronic pain: Secondary | ICD-10-CM | POA: Insufficient documentation

## 2020-09-12 NOTE — Therapy (Addendum)
[facility] [place], [place], [address] Phone: [phone]   Fax:  [phone]  Physical Therapy Treatment / Discharge  Patient Details  Name: Sarah Randall MRN: [id number] Date of Birth: [birth year] Referring Provider (PT):  Vallarie Mare, MD    Encounter Date: 09/12/2020   PT End of Session - 09/12/20 1544    Visit Number 4    Number of Visits 13    Date for PT Re-Evaluation 09/30/20    Authorization Type MCR - KX mod 15th visit    Progress Note Due on Visit 10    PT Start Time 1547    PT Stop Time 1635    PT Time Calculation (min) 48 min    Activity Tolerance Patient tolerated treatment well    Behavior During Therapy [facility] for tasks assessed/performed           Past Medical History:  Diagnosis Date  . Anemia   . Anxiety   . Arthritis   . B12 deficiency 06/30/2019  . Depression   . GERD (gastroesophageal reflux disease)    occasionally takes OTC  . Hyperlipidemia   . Hypertension   . Pre-diabetes   . Sleep apnea    tested 2014 - unable to tolerate machine    Past Surgical History:  Procedure Laterality Date  . LEFT HEART CATH AND CORONARY ANGIOGRAPHY N/A 10/25/2018   Procedure: LEFT HEART CATH AND CORONARY ANGIOGRAPHY;  Surgeon: Martinique, Peter M, MD;  Location: [facility];  Service: Cardiovascular;  Laterality: N/A;  . ROBOTIC ASSISTED LAPAROSCOPIC HYSTERECTOMY AND SALPINGECTOMY Bilateral 12/27/2019   Procedure: XI ROBOTIC ASSISTED TOTAL LAPAROSCOPIC HYSTERECTOMY AND SALPINGECTOMY;  Surgeon: Princess Bruins, MD;  Location: [facility];  Service: Gynecology;  Laterality: Bilateral;  . TOTAL HIP ARTHROPLASTY Left 09/22/2017   Procedure: LEFT TOTAL HIP ARTHROPLASTY ANTERIOR APPROACH;  Surgeon: Leandrew Koyanagi, MD;  Location: [facility];  Service: Orthopedics;  Laterality: Left;  . TOTAL KNEE ARTHROPLASTY Right 01/12/2018   Procedure: RIGHT TOTAL KNEE ARTHROPLASTY;  Surgeon: Leandrew Koyanagi, MD;  Location: [facility];  Service: Orthopedics;  Laterality: Right;  . TUBAL LIGATION      There were no vitals filed for this visit.   Subjective Assessment - 09/12/20 1549    Subjective " I am having increased soreness in the back and pain going into my bottom. I did go to the ED on 10/1 and reported pain in the chest and R arm and they did lots of testing and they stated it was more muscle spasm"    Patient Stated Goals to get better, decrease pain  get back to functioning with out issues.    Currently in Pain? Yes    Pain Location Back              [facility] PT Assessment - 09/12/20 0001      Assessment   Medical Diagnosis Spondylolisthesis, lumbar region M43.16    Referring Provider (PT)  Vallarie Mare, MD                          [facility] Adult PT Treatment/Exercise - 09/12/20 0001      Lumbar Exercises: Stretches   Lower Trunk Rotation Limitations 1 x 20    Prone on Elbows Stretch Limitations 3 x 20      Lumbar Exercises: Aerobic   Nustep L5 x 6 min UE/LE  Lumbar Exercises: Supine   Pelvic Tilt 10 reps;5 seconds   PPT    Bent Knee Raise 10 reps;20 reps   combined with PPT      Knee/Hip Exercises: Supine   Bridges 2 sets;15 reps   with glute set x 2 seconds     Modalities   Modalities Moist Heat      Moist Heat Therapy   Number Minutes Moist Heat 10 Minutes    Moist Heat Location Lumbar Spine   in prone     Manual Therapy   Manual Therapy Myofascial release    Manual therapy comments MTPR for lumbar paraspinals x 2 bil    Myofascial Release DTM along bil lumbar paraspinals                    PT Short Term Goals - 08/19/20 1645      PT SHORT TERM GOAL #1   Title pt to be I with inital HEP    Baseline -    Time 3    Period Weeks    Status New    Target Date 09/09/20      PT SHORT TERM GOAL #2   Title pt to verbalize/ demo efficient posture and lifting mechancis to reduce and prevent low back pain    Baseline -     Time 3    Period Weeks    Status New    Target Date 10/14/20      PT SHORT TERM GOAL #3   Title -    Baseline -             PT Long Term Goals - 08/19/20 1645      PT LONG TERM GOAL #1   Title increase bil gross hip strength to >/= 4+/5 to promote hip stability and maximize efficient posture.    Time 6    Period Weeks    Status New    Target Date 09/30/20      PT LONG TERM GOAL #2   Title pt to report no RLE referral for >/= 1 week for functional improvement and QOL    Time 6    Period Weeks    Status New    Target Date 09/30/20      PT LONG TERM GOAL #3   Title pt to be able to walk/ standing for >/= 45 min  </= 2/10 pain for functional endurance required for community ambulation    Time 6    Period Weeks    Status New    Target Date 09/30/20      PT LONG TERM GOAL #4   Title pt to increase FOTO score to </= 45% limited to demo improvement in function    Time 6    Period Weeks    Status New    Target Date 09/30/20      PT LONG TERM GOAL #5   Title pt to be IND with all HEP given to maintain and progress current LOF IND    Time 6    Period Weeks    Status New    Target Date 09/30/20                 Plan - 09/12/20 1557    Clinical Impression Statement pt reports increased pain in her back with referred symtoms going to her glutes. she reports going to the ED on 10//1 for chest tightnes and R shoulder pain but testing was neg for  cardiac pathology. She continues to respond to extension bias noting centralization, and responds well to hip/ core strenghening. MHP applied to back with pt in prone to keep working on back    PT Treatment/Interventions ADLs/Self Care Home Management;Cryotherapy;Electrical Stimulation;Iontophoresis 79m/ml Dexamethasone;Moist Heat;Ultrasound;Traction;Therapeutic exercise;Balance training;Therapeutic activities;Functional mobility training;Neuromuscular re-education;Patient/family education;Manual techniques;Passive range of  motion;Dry needling;Taping    PT Next Visit Plan review/ update HEP PRN,  if continued to tolerate prone extension with centralization, progress as able. gross hip/ core strengthening    PT Home Exercise Plan [id number] - prone on elbows, supine marching, bridge, sidelying hip abduction, posture enducation    Consulted and Agree with Plan of Care Patient           Patient will benefit from skilled therapeutic intervention in order to improve the following deficits and impairments:  Improper body mechanics, Increased muscle spasms, Decreased strength, Abnormal gait, Obesity, Pain, Decreased activity tolerance, Decreased balance, Decreased endurance, Postural dysfunction  Visit Diagnosis: Chronic bilateral low back pain with right-sided sciatica  Other abnormalities of gait and mobility  Muscle weakness (generalized)  Muscle spasm of back     Problem List Patient Active Problem List   Diagnosis Date Noted  . Chronic bilateral low back pain 05/16/2020  . Postoperative state 12/27/2019  . Iron deficiency 08/03/2019  . B12 deficiency 06/30/2019  . Chronic right shoulder pain 07/05/2018  . Trochanteric bursitis of left hip 07/05/2018  . Morbid obesity ([place] 07/05/2018  . Body mass index 45.0-49.9, adult ([place] 07/05/2018  . Marijuana user 05/26/2018  . Status post total right knee replacement 01/12/2018  . Status post total hip replacement, left 09/22/2017  . Hyperlipidemia 08/10/2017  . Primary osteoarthritis of left hip 06/25/2017  . Primary osteoarthritis of right knee 06/25/2017  . Mild depression ([place] 06/25/2017  . Essential hypertension 06/25/2017  . Tobacco abuse 06/25/2017  . Prediabetes 06/25/2017  . OSA on CPAP 06/25/2017  . Bronchitis 09/24/2016  . Leukocytosis 09/24/2016  . Radiculopathy, lumbar region 07/16/2016  . Degenerative lumbar disc 07/03/2016  . Synovitis of hip 04/20/2016  . Screening for breast cancer 12/28/2014  . Pain in joint involving lower leg  05/13/2013    KStarr LakePT, DPT, LAT, ATC  09/12/20  4:26 PM      CM[facility]CThe [facility] StreetGTyler NAlaska [address]Phone: [phone]  Fax:  [phone] Name: ANARALY FRITCHERMRN: [id number] of Birth: 101/30/1967     PHYSICAL THERAPY DISCHARGE SUMMARY  Visits from Start of Care: 4  Current functional level related to goals / functional outcomes: See goals   Remaining deficits: Current Status unknown   Education / Equipment: HEP, theraband, posture  Plan: Patient agrees to discharge.  Patient goals were not met. Patient is being discharged due to meeting the stated rehab goals.  ?????        Pretty Weltman PT, DPT, LAT, ATC  11/18/20  9:22 AM

## 2020-09-16 ENCOUNTER — Ambulatory Visit (HOSPITAL_BASED_OUTPATIENT_CLINIC_OR_DEPARTMENT_OTHER): Payer: Medicare Other | Admitting: Pharmacist

## 2020-09-16 ENCOUNTER — Encounter: Payer: Self-pay | Admitting: Internal Medicine

## 2020-09-16 ENCOUNTER — Ambulatory Visit: Payer: Medicare Other | Attending: Internal Medicine | Admitting: Internal Medicine

## 2020-09-16 ENCOUNTER — Other Ambulatory Visit: Payer: Self-pay

## 2020-09-16 VITALS — BP 120/86 | HR 86 | Resp 16 | Wt 296.0 lb

## 2020-09-16 DIAGNOSIS — E785 Hyperlipidemia, unspecified: Secondary | ICD-10-CM

## 2020-09-16 DIAGNOSIS — Z114 Encounter for screening for human immunodeficiency virus [HIV]: Secondary | ICD-10-CM

## 2020-09-16 DIAGNOSIS — Z789 Other specified health status: Secondary | ICD-10-CM

## 2020-09-16 DIAGNOSIS — F17210 Nicotine dependence, cigarettes, uncomplicated: Secondary | ICD-10-CM

## 2020-09-16 DIAGNOSIS — L0293 Carbuncle, unspecified: Secondary | ICD-10-CM

## 2020-09-16 DIAGNOSIS — Z23 Encounter for immunization: Secondary | ICD-10-CM

## 2020-09-16 DIAGNOSIS — I10 Essential (primary) hypertension: Secondary | ICD-10-CM | POA: Diagnosis not present

## 2020-09-16 DIAGNOSIS — F17200 Nicotine dependence, unspecified, uncomplicated: Secondary | ICD-10-CM | POA: Diagnosis not present

## 2020-09-16 DIAGNOSIS — M5416 Radiculopathy, lumbar region: Secondary | ICD-10-CM | POA: Diagnosis not present

## 2020-09-16 DIAGNOSIS — Z1159 Encounter for screening for other viral diseases: Secondary | ICD-10-CM

## 2020-09-16 DIAGNOSIS — E538 Deficiency of other specified B group vitamins: Secondary | ICD-10-CM | POA: Diagnosis not present

## 2020-09-16 DIAGNOSIS — R7303 Prediabetes: Secondary | ICD-10-CM

## 2020-09-16 MED ORDER — HYDROCHLOROTHIAZIDE 25 MG PO TABS: 25.0000 mg | ORAL_TABLET | Freq: Every day | ORAL | 4 refills | Status: DC

## 2020-09-16 MED ORDER — SULFAMETHOXAZOLE-TRIMETHOPRIM 800-160 MG PO TABS: 1.0000 | ORAL_TABLET | Freq: Two times a day (BID) | ORAL | 0 refills | Status: DC

## 2020-09-16 NOTE — Patient Instructions (Addendum)
I have sent a prescription to your pharmacy for an antibiotic called Bactrim.  I have sent a referral for pain management and medical weight management.  Your blood pressure is not at goal.  We have increase the hydrochlorothiazide to 25 mg daily.  Influenza Virus Vaccine injection (Fluarix) What is this medicine? INFLUENZA VIRUS VACCINE (in floo EN zuh VAHY ruhs vak SEEN) helps to reduce the risk of getting influenza also known as the flu. This medicine may be used for other purposes; ask your health care provider or pharmacist if you have questions. COMMON BRAND NAME(S): Fluarix, Fluzone What should I tell my health care provider before I take this medicine? They need to know if you have any of these conditions:  bleeding disorder like hemophilia  fever or infection  Guillain-Barre syndrome or other neurological problems  immune system problems  infection with the human immunodeficiency virus (HIV) or AIDS  low blood platelet counts  multiple sclerosis  an unusual or allergic reaction to influenza virus vaccine, eggs, chicken proteins, latex, gentamicin, other medicines, foods, dyes or preservatives  pregnant or trying to get pregnant  breast-feeding How should I use this medicine? This vaccine is for injection into a muscle. It is given by a health care professional. A copy of Vaccine Information Statements will be given before each vaccination. Read this sheet carefully each time. The sheet may change frequently. Talk to your pediatrician regarding the use of this medicine in children. Special care may be needed. Overdosage: If you think you have taken too much of this medicine contact a poison control center or emergency room at once. NOTE: This medicine is only for you. Do not share this medicine with others. What if I miss a dose? This does not apply. What may interact with this medicine?  chemotherapy or radiation therapy  medicines that lower your immune system  like etanercept, anakinra, infliximab, and adalimumab  medicines that treat or prevent blood clots like warfarin  phenytoin  steroid medicines like prednisone or cortisone  theophylline  vaccines This list may not describe all possible interactions. Give your health care provider a list of all the medicines, herbs, non-prescription drugs, or dietary supplements you use. Also tell them if you smoke, drink alcohol, or use illegal drugs. Some items may interact with your medicine. What should I watch for while using this medicine? Report any side effects that do not go away within 3 days to your doctor or health care professional. Call your health care provider if any unusual symptoms occur within 6 weeks of receiving this vaccine. You may still catch the flu, but the illness is not usually as bad. You cannot get the flu from the vaccine. The vaccine will not protect against colds or other illnesses that may cause fever. The vaccine is needed every year. What side effects may I notice from receiving this medicine? Side effects that you should report to your doctor or health care professional as soon as possible:  allergic reactions like skin rash, itching or hives, swelling of the face, lips, or tongue Side effects that usually do not require medical attention (report to your doctor or health care professional if they continue or are bothersome):  fever  headache  muscle aches and pains  pain, tenderness, redness, or swelling at site where injected  weak or tired This list may not describe all possible side effects. Call your doctor for medical advice about side effects. You may report side effects to FDA at 1-800-FDA-1088. Where  should I keep my medicine? This vaccine is only given in a clinic, pharmacy, doctor's office, or other health care setting and will not be stored at home. NOTE: This sheet is a summary. It may not cover all possible information. If you have questions about this  medicine, talk to your doctor, pharmacist, or health care provider.  2020 Elsevier/Gold Standard (2008-06-20 09:30:40)   Td (Tetanus, Diphtheria) Vaccine: What You Need to Know 1. Why get vaccinated? Td vaccine can prevent tetanus and diphtheria. Tetanus enters the body through cuts or wounds. Diphtheria spreads from person to person.  TETANUS (T) causes painful stiffening of the muscles. Tetanus can lead to serious health problems, including being unable to open the mouth, having trouble swallowing and breathing, or death.  DIPHTHERIA (D) can lead to difficulty breathing, heart failure, paralysis, or death. 2. Td vaccine Td is only for children 7 years and older, adolescents, and adults.  Td is usually given as a booster dose every 10 years, but it can also be given earlier after a severe and dirty wound or burn. Another vaccine, called Tdap, that protects against pertussis, also known as "whooping cough," in addition to tetanus and diphtheria, may be used instead of Td.  Td may be given at the same time as other vaccines. 3. Talk with your health care provider Tell your vaccine provider if the person getting the vaccine:  Has had an allergic reaction after a previous dose of any vaccine that protects against tetanus or diphtheria, or has any severe, life-threatening allergies.  Has ever had Guillain-Barr Syndrome (also called GBS).  Has had severe pain or swelling after a previous dose of any vaccine that protects against tetanus or diphtheria. In some cases, your health care provider may decide to postpone Td vaccination to a future visit.  People with minor illnesses, such as a cold, may be vaccinated. People who are moderately or severely ill should usually wait until they recover before getting Td vaccine.  Your health care provider can give you more information. 4. Risks of a vaccine reaction  Pain, redness, or swelling where the shot was given, mild fever, headache, feeling  tired, and nausea, vomiting, diarrhea, or stomachache sometimes happen after Td vaccine. People sometimes faint after medical procedures, including vaccination. Tell your provider if you feel dizzy or have vision changes or ringing in the ears.  As with any medicine, there is a very remote chance of a vaccine causing a severe allergic reaction, other serious injury, or death. 5. What if there is a serious problem? An allergic reaction could occur after the vaccinated person leaves the clinic. If you see signs of a severe allergic reaction (hives, swelling of the face and throat, difficulty breathing, a fast heartbeat, dizziness, or weakness), call 9-1-1 and get the person to the nearest hospital.  For other signs that concern you, call your health care provider.  Adverse reactions should be reported to the Vaccine Adverse Event Reporting System (VAERS). Your health care provider will usually file this report, or you can do it yourself. Visit the VAERS website at www.vaers.SamedayNews.es or call (413) 559-3442. VAERS is only for reporting reactions, and VAERS staff do not give medical advice. 6. The National Vaccine Injury Compensation Program The [employer] Vaccine Injury Compensation Program (VICP) is a federal program that was created to compensate people who may have been injured by certain vaccines. Visit the VICP website at GoldCloset.com.ee or call (304)248-8590 to learn about the program and about filing a claim. There  is a time limit to file a claim for compensation. 7. How can I learn more?  Ask your health care provider.  Call your local or state health department.  Contact the Centers for Disease Control and Prevention (CDC): ? Call (604)558-4360 (1-800-CDC-INFO) or ? Visit CDC's website at http://hunter.com/ Vaccine Information Statement Td Vaccine (03/08/19) This information is not intended to replace advice given to you by your health care provider. Make sure you discuss  any questions you have with your health care provider. Document Revised: 04/17/2019 Document Reviewed: 03/20/2019 Elsevier Patient Education  [place].

## 2020-09-16 NOTE — Progress Notes (Signed)
Patient presents for vaccination against influenza and tetanus per orders of Dr. Wynetta Emery. Consent given. Counseling provided. No contraindications exists. Vaccine administered without incident.   Benard Halsted, PharmD, [place] [phone]

## 2020-09-16 NOTE — Progress Notes (Signed)
Patient ID: Sarah Randall, female    DOB: July 03, 1966  MRN: [id number]  CC: Medication Refill   Subjective: [name] is a 54 y.o. female who presents for chronic ds mnagement.  Last seen 12/2018 Her concerns today include:  Pt with hx of HTN,HL (on statin 2 x a wk -cramps),pre-DM, tob abuse, OSA has CPAP but not using, tob dep obesity,GERD,depression,IDA, B12 def(Dr. Feng),obesity,DDD oflumbar spine, OA RT hip and LT knees/p jt replacements, menorrhagia/fibroids  Had hysterectomy since last visit with me.  Chronic back pain: Patient was referred to neurosurgeon for further evaluation and possible surgical treatment for her lower back issue.  MRI of the lumbar spine done earlier this year revealed some foraminal stenosis, intermittent lower thoracic and lumbar disc, endplate and posterior element degeneration with endplate marrow edema at T11-T12 and L4-L5.  Patient was seen by [facility] Dr. Marcello Moores.  Referred her for P.T.  Placed on Gabapentin 300 TID.  She stopped taking because it causes swelling in LT leg.  She has called his nurse to let him know but she has not heard back from them -c/o pain lower back and RT buttock; radiates down RT leg.  Had 2 ESI this yr.  Helps but effects last 3-4 days at most.  -most days pain at 8/10.  Worse in mornings and with walking.  Has f/u with Dr. Marcello Moores on 09/30/2020.    Vit B12 def - taking 1000 mcg QOD from OTC. Last seen by Dr. Burr Medico 11/2019.   HYPERTENSION Currently taking: see medication list.  On Cozaar and HCTZ Med Adherence: [x]  Yes    []  No Medication side effects: []  Yes    [x]  No Adherence with salt restriction: []  Yes    [x]  No Home Monitoring?: []  Yes    []  No Monitoring Frequency: []  Yes    []  No Home BP results range: []  Yes    []  No SOB? []  Yes    []  No Chest Pain?: [x]  Yes  -Seen in ER 09/06/2020 with RT sided CP going to RT arm. 3 days later she woke up with twitting in muscle in LT upper  arm Leg swelling?: []  Yes    [x]  No Headaches?: []  Yes    [x]  No Dizziness? []  Yes    [x]  No Comments:   HL:  She stopped Pravachol months ago.  Caused sharp pain in LT leg above ankle so she d/c taking. We had her taking it several times a wk instead of daily but she still could not tolerate.  Similar issue with Lipitor when tried in the past.  Tob dep:  Smoking 3-4 cig/day.  Wants to quit but gets stress and emotional.    Obesity:  Gained 32 lbs since 03/2020.  Not active due to back issues.  Referred to bariatric surgeon by Dr. Erlinda Hong.  Did on line seminar but not sure which way to go -get surgery on her back or do the bariatric sugery.   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 Eating more since start of pandemic  Continues to get small boils intermittently under breast and abdominal fold Patient Active Problem List   Diagnosis Date Noted  . Statin intolerance 09/17/2020  . Chronic bilateral low back pain 05/16/2020  . Postoperative state 12/27/2019  . Iron deficiency 08/03/2019  . B12 deficiency 06/30/2019  . Chronic right shoulder pain 07/05/2018  . Trochanteric bursitis of left hip 07/05/2018  . Morbid obesity ([place]) 07/05/2018  . Body  mass index 45.0-49.9, adult ([place]) 07/05/2018  . Marijuana user 05/26/2018  . Status post total right knee replacement 01/12/2018  . Status post total hip replacement, left 09/22/2017  . Hyperlipidemia 08/10/2017  . Primary osteoarthritis of left hip 06/25/2017  . Primary osteoarthritis of right knee 06/25/2017  . Mild depression ([place]) 06/25/2017  . Essential hypertension 06/25/2017  . Tobacco abuse 06/25/2017  . Prediabetes 06/25/2017  . OSA on CPAP 06/25/2017  . Bronchitis 09/24/2016  . Leukocytosis 09/24/2016  . Radiculopathy, lumbar region 07/16/2016  . Degenerative lumbar disc 07/03/2016  . Synovitis of hip 04/20/2016  . Screening for breast cancer 12/28/2014  . Pain in joint involving lower leg 05/13/2013     Current Outpatient  Medications on File Prior to Visit  Medication Sig Dispense Refill  . cyclobenzaprine (FLEXERIL) 10 MG tablet Take 1 tablet (10 mg total) by mouth 2 (two) times daily as needed for muscle spasms. 10 tablet 0  . esomeprazole (NEXIUM) 40 MG capsule TAKE 1 CAPSULE BY MOUTH EVERY DAY 90 capsule 1  . loratadine (CLARITIN) 10 MG tablet TAKE 1 TABLET BY MOUTH EVERY DAY 30 tablet 2  . losartan (COZAAR) 50 MG tablet TAKE 1 TABLET BY MOUTH EVERY DAY 90 tablet 0  . nitroGLYCERIN (NITROSTAT) 0.4 MG SL tablet Place 1 tablet (0.4 mg total) under the tongue every 5 (five) minutes as needed. 25 tablet 11  . propranolol (INDERAL) 10 MG tablet Take 1 tablet (10 mg total) by mouth 2 (two) times daily. 180 tablet 3   No current facility-administered medications on file prior to visit.    Allergies  Allergen Reactions  . Lipitor [Atorvastatin] Other (See Comments)    Stabbing pains in legs  . Pravachol [Pravastatin]     Sharp pains in LT leg  . Zoloft [Sertraline Hcl] Other (See Comments)    tremors    Social History   Socioeconomic History  . Marital status: Single    Spouse name: Not on file  . Number of children: 2  . Years of education: 9  . Highest education level: Not on file  Occupational History  . Occupation: unemployed  Tobacco Use  . Smoking status: Current Some Day Smoker    Packs/day: 0.25    Years: 30.00    Pack years: 7.50    Types: Cigarettes  . Smokeless tobacco: Never Used  Vaping Use  . Vaping Use: Never used  Substance and Sexual Activity  . Alcohol use: Yes    Alcohol/week: 4.0 - 6.0 standard drinks    Types: 4 - 6 Shots of liquor per week    Comment: occasional  . Drug use: Yes    Types: Marijuana    Comment: smoke 1 joint cannibis 1 week ago.  Marland Kitchen Sexual activity: Yes    Birth control/protection: None    Comment: declined insurance questions,des neg  Other Topics Concern  . Not on file  Social History Narrative  . Not on file   Social Determinants of Health    Financial Resource Strain:   . Difficulty of Paying Living Expenses: Not on file  Food Insecurity:   . Worried About Charity fundraiser in the Last Year: Not on file  . Ran Out of Food in the Last Year: Not on file  Transportation Needs:   . Lack of Transportation (Medical): Not on file  . Lack of Transportation (Non-Medical): Not on file  Physical Activity:   . Days of Exercise per Week: Not on file  .  Minutes of Exercise per Session: Not on file  Stress:   . Feeling of Stress : Not on file  Social Connections:   . Frequency of Communication with Friends and Family: Not on file  . Frequency of Social Gatherings with Friends and Family: Not on file  . Attends Religious Services: Not on file  . Active Member of Clubs or Organizations: Not on file  . Attends Archivist Meetings: Not on file  . Marital Status: Not on file  Intimate Partner Violence:   . Fear of Current or Ex-Partner: Not on file  . Emotionally Abused: Not on file  . Physically Abused: Not on file  . Sexually Abused: Not on file    Family History  Problem Relation Age of Onset  . Hypertension Mother   . Diabetes Mother   . Breast cancer Mother 29  . Hypertension Father   . Lung cancer Maternal Aunt   . Colon polyps Brother   . Colon cancer Neg Hx   . Esophageal cancer Neg Hx   . Rectal cancer Neg Hx   . Stomach cancer Neg Hx     Past Surgical History:  Procedure Laterality Date  . LEFT HEART CATH AND CORONARY ANGIOGRAPHY N/A 10/25/2018   Procedure: LEFT HEART CATH AND CORONARY ANGIOGRAPHY;  Surgeon: Martinique, Peter M, MD;  Location: [facility];  Service: Cardiovascular;  Laterality: N/A;  . ROBOTIC ASSISTED LAPAROSCOPIC HYSTERECTOMY AND SALPINGECTOMY Bilateral 12/27/2019   Procedure: XI ROBOTIC ASSISTED TOTAL LAPAROSCOPIC HYSTERECTOMY AND SALPINGECTOMY;  Surgeon: Princess Bruins, MD;  Location: [facility];  Service: Gynecology;  Laterality: Bilateral;  . TOTAL HIP  ARTHROPLASTY Left 09/22/2017   Procedure: LEFT TOTAL HIP ARTHROPLASTY ANTERIOR APPROACH;  Surgeon: Leandrew Koyanagi, MD;  Location: [facility];  Service: Orthopedics;  Laterality: Left;  . TOTAL KNEE ARTHROPLASTY Right 01/12/2018   Procedure: RIGHT TOTAL KNEE ARTHROPLASTY;  Surgeon: Leandrew Koyanagi, MD;  Location: [facility];  Service: Orthopedics;  Laterality: Right;  . TUBAL LIGATION      ROS: Review of Systems Negative except as stated above  PHYSICAL EXAM: BP 120/86   Pulse 86   Resp 16   Wt 296 lb (134.3 kg)   LMP  (LMP Unknown)   SpO2 98%   BMI 50.81 kg/m   Wt Readings from Last 3 Encounters:  09/16/20 296 lb (134.3 kg)  07/18/20 288 lb (130.6 kg)  03/09/20 264 lb (119.7 kg)    Physical Exam  General appearance - alert, well appearing, obese African-American female and in no distress Mental status -patient tearful at times Neck - supple, no significant adenopathy Chest - clear to auscultation, no wheezes, rales or rhonchi, symmetric air entry Heart - normal rate, regular rhythm, normal S1, S2, no murmurs, rubs, clicks or gallops Extremities - peripheral pulses normal, no pedal edema, no clubbing or cyanosis  Depression screen [facility] 2/9 09/16/2020 09/07/2019 06/20/2019  Decreased Interest 2 0 1  Down, Depressed, Hopeless 2 0 1  PHQ - 2 Score 4 0 2  Altered sleeping 1 - 0  Tired, decreased energy 1 - 1  Change in appetite 1 - 0  Feeling bad or failure about yourself  1 - 1  Trouble concentrating 1 - 1  Moving slowly or fidgety/restless 0 - 0  Suicidal thoughts 1 - 0  PHQ-9 Score 10 - 5    CMP Latest Ref Rng & Units 09/16/2020 09/06/2020 06/20/2019  Glucose 70 - 99 mg/dL - 151(H) 105(H)  BUN 6 - 20 mg/dL - 12 12  Creatinine 0.44 - 1.00 mg/dL - 1.06(H) 0.92  Sodium 135 - 145 mmol/L - 136 138  Potassium 3.5 - 5.1 mmol/L - 3.8 4.0  Chloride 98 - 111 mmol/L - 99 100  CO2 22 - 32 mmol/L - 25 21  Calcium 8.9 - 10.3 mg/dL - 8.9 9.4  Total Protein 6.0 - 8.5 g/dL 7.3 - 7.3  Total  Bilirubin 0.0 - 1.2 mg/dL 0.3 - <0.2  Alkaline Phos 44 - 121 IU/L 104 - 94  AST 0 - 40 IU/L 25 - 16  ALT 0 - 32 IU/L 30 - 11   Lipid Panel     Component Value Date/Time   CHOL 318 (H) 09/16/2020 1445   TRIG 231 (H) 09/16/2020 1445   HDL 41 09/16/2020 1445   CHOLHDL 7.8 (H) 09/16/2020 1445   LDLCALC 230 (H) 09/16/2020 1445    CBC    Component Value Date/Time   WBC 13.1 (H) 09/06/2020 0606   RBC 4.75 09/06/2020 0606   HGB 14.7 09/06/2020 0606   HGB 14.9 11/24/2019 1339   HGB 14.9 09/07/2019 1601   HCT 44.8 09/06/2020 0606   HCT 44.2 09/07/2019 1601   PLT 321 09/06/2020 0606   PLT 330 11/24/2019 1339   PLT 420 11/14/2018 1546   MCV 94.3 09/06/2020 0606   MCV 91 11/14/2018 1546   MCH 30.9 09/06/2020 0606   MCHC 32.8 09/06/2020 0606   RDW 13.6 09/06/2020 0606   RDW 12.7 11/14/2018 1546   LYMPHSABS 5.3 (H) 11/24/2019 1339   LYMPHSABS 5.5 (H) 11/14/2018 1546   MONOABS 0.7 11/24/2019 1339   EOSABS 0.3 11/24/2019 1339   EOSABS 0.3 11/14/2018 1546   BASOSABS 0.1 11/24/2019 1339   BASOSABS 0.1 11/14/2018 1546    ASSESSMENT AND PLAN:  1. Essential hypertension Not at goal.  Increase hydrochlorothiazide to 25 mg daily. - hydrochlorothiazide (HYDRODIURIL) 25 MG tablet; Take 1 tablet (25 mg total) by mouth daily.  Dispense: 30 tablet; Refill: 4  2. Lumbar radiculopathy Patient being seen by neurosurgeon Dr. Marcello Moores.  While she awaits his decision regarding whether she is a surgical candidate or not, she is agreeable to referral to pain clinic to help with pain management. - Ambulatory referral to Pain Clinic  3. Tobacco dependence Strongly advised to quit.  Advised that smoking increases risk for cardiovascular events and lung diseases and various cancers.  Patient feels that she is not mentally ready to quit but will continue to try to cut down.  Less than 5 minutes spent on counseling.  4. Vitamin B12 deficiency I forgot to order the B12 level.  I will asked the lab to see  if it can be added onto blood tests that were done today  5. Hyperlipidemia, unspecified hyperlipidemia type Recheck lipid profile.  Advised patient that uncontrolled cholesterol along with cigarette smoking increases risk for heart attack and strokes.  Depending on her levels, we may try a different statin having her take it twice a week, try Zetia or refer her to lipid clinic. - Lipid panel - Hepatic function panel  6. Statin intolerance See #5 above  7. Morbid obesity ([place]) Discussed healthy eating habits.  Encouraged her to move as much as her back will allow.  She is agreeable to going to medical weight management as she has not made up her mind about having weight reduction surgery. - Amb Ref to Medical Weight Management  8. Prediabetes See #7 above -  Hemoglobin A1c  9. Recurrent boils - sulfamethoxazole-trimethoprim (BACTRIM DS) 800-160 MG tablet; Take 1 tablet by mouth 2 (two) times daily.  Dispense: 14 tablet; Refill: 0  10. Need for influenza vaccination Given today  11. Need for Tdap vaccination Given today  12. Need for hepatitis C screening test Patient agreeable to screening - Hepatitis C Antibody  13. Screening for HIV (human immunodeficiency virus) Patient agreeable to screening - HIV Antibody (routine testing w rflx)   Patient was given the opportunity to ask questions.  Patient verbalized understanding of the plan and was able to repeat key elements of the plan.   Orders Placed This Encounter  Procedures  . Hepatitis C Antibody  . HIV Antibody (routine testing w rflx)  . Lipid panel  . Hepatic function panel  . Hemoglobin A1c  . Amb Ref to Medical Weight Management  . Ambulatory referral to Pain Clinic     Requested Prescriptions   Signed Prescriptions Disp Refills  . sulfamethoxazole-trimethoprim (BACTRIM DS) 800-160 MG tablet 14 tablet 0    Sig: Take 1 tablet by mouth 2 (two) times daily.  . hydrochlorothiazide (HYDRODIURIL) 25 MG tablet 30  tablet 4    Sig: Take 1 tablet (25 mg total) by mouth daily.    Return in about 3 months (around 12/17/2020).  Karle Plumber, MD, FACP

## 2020-09-17 ENCOUNTER — Telehealth: Payer: Self-pay | Admitting: Internal Medicine

## 2020-09-17 ENCOUNTER — Ambulatory Visit: Payer: Medicare Other | Admitting: Physical Therapy

## 2020-09-17 DIAGNOSIS — E785 Hyperlipidemia, unspecified: Secondary | ICD-10-CM

## 2020-09-17 DIAGNOSIS — Z789 Other specified health status: Secondary | ICD-10-CM | POA: Insufficient documentation

## 2020-09-17 DIAGNOSIS — L0293 Carbuncle, unspecified: Secondary | ICD-10-CM | POA: Insufficient documentation

## 2020-09-17 LAB — HEPATIC FUNCTION PANEL
ALT: 30 IU/L (ref 0–32)
AST: 25 IU/L (ref 0–40)
Albumin: 4.4 g/dL (ref 3.8–4.9)
Alkaline Phosphatase: 104 IU/L (ref 44–121)
Bilirubin Total: 0.3 mg/dL (ref 0.0–1.2)
Bilirubin, Direct: <0.1 mg/dL (ref 0.00–0.40)
Total Protein: 7.3 g/dL (ref 6.0–8.5)

## 2020-09-17 LAB — HEPATITIS C ANTIBODY: Hep C Virus Ab: <0.1 s/co ratio (ref 0.0–0.9)

## 2020-09-17 LAB — LIPID PANEL
Chol/HDL Ratio: 7.8 ratio — ABNORMAL HIGH (ref 0.0–4.4)
Cholesterol, Total: 318 mg/dL — ABNORMAL HIGH (ref 100–199)
HDL: 41 mg/dL (ref >39)
LDL Chol Calc (NIH): 230 mg/dL — ABNORMAL HIGH (ref 0–99)
Triglycerides: 231 mg/dL — ABNORMAL HIGH (ref 0–149)
VLDL Cholesterol Cal: 47 mg/dL — ABNORMAL HIGH (ref 5–40)

## 2020-09-17 LAB — HEMOGLOBIN A1C
Est. average glucose Bld gHb Est-mCnc: 131 mg/dL
Hgb A1c MFr Bld: 6.2 % — ABNORMAL HIGH (ref 4.8–5.6)

## 2020-09-17 LAB — HIV ANTIBODY (ROUTINE TESTING W REFLEX): HIV Screen 4th Generation wRfx: NONREACTIVE

## 2020-09-17 NOTE — Telephone Encounter (Signed)
Referral to lipid clinc

## 2020-09-17 NOTE — Telephone Encounter (Signed)
Phone call placed to patient today.  Patient advised that her total and LDL cholesterol are very elevated.  Patient has been intolerant of statins.  Advised that high cholesterol increases her risk for heart attack and strokes. ASCVDS risk score is 30.  I recommend referral to lipid clinic to help with management.  Patient is agreeable to that. Liver function tests normal. She is still in the range for prediabetes.  Healthy eating habits discussed yesterday and she was referred to medical weight management. Screening for hepatitis C and HIV negative.  Results for orders placed or performed in visit on 09/16/20  Hepatitis C Antibody  Result Value Ref Range   Hep C Virus Ab <0.1 0.0 - 0.9 s/co ratio  HIV Antibody (routine testing w rflx)  Result Value Ref Range   HIV Screen 4th Generation wRfx Non Reactive Non Reactive  Lipid panel  Result Value Ref Range   Cholesterol, Total 318 (H) 100 - 199 mg/dL   Triglycerides 231 (H) 0 - 149 mg/dL   HDL 41 >39 mg/dL   VLDL Cholesterol Cal 47 (H) 5 - 40 mg/dL   LDL Chol Calc (NIH) 230 (H) 0 - 99 mg/dL   Chol/HDL Ratio 7.8 (H) 0.0 - 4.4 ratio  Hepatic function panel  Result Value Ref Range   Total Protein 7.3 6.0 - 8.5 g/dL   Albumin 4.4 3.8 - 4.9 g/dL   Bilirubin Total 0.3 0.0 - 1.2 mg/dL   Bilirubin, Direct <0.10 0.00 - 0.40 mg/dL   Alkaline Phosphatase 104 44 - 121 IU/L   AST 25 0 - 40 IU/L   ALT 30 0 - 32 IU/L  Hemoglobin A1c  Result Value Ref Range   Hgb A1c MFr Bld 6.2 (H) 4.8 - 5.6 %   Est. average glucose Bld gHb Est-mCnc 131 mg/dL

## 2020-09-18 ENCOUNTER — Telehealth: Payer: Self-pay | Admitting: Physical Therapy

## 2020-09-18 NOTE — Telephone Encounter (Signed)
Contacted patient regarding no show yesterday. She reports that she learned she was in proximity to someone who has tested positive for COVID-19 and is doing self quarantine this week. She plans to resume her appointments next week.

## 2020-09-19 ENCOUNTER — Encounter: Payer: Medicare Other | Admitting: Physical Therapy

## 2020-09-19 LAB — SPECIMEN STATUS REPORT

## 2020-09-19 LAB — VITAMIN B12: Vitamin B-12: 489 pg/mL (ref 232–1245)

## 2020-09-20 ENCOUNTER — Ambulatory Visit: Payer: Medicare Other | Admitting: Physical Therapy

## 2020-09-20 ENCOUNTER — Encounter: Payer: Self-pay | Admitting: Physical Medicine & Rehabilitation

## 2020-09-24 ENCOUNTER — Encounter: Payer: Medicare Other | Admitting: Physical Therapy

## 2020-09-26 ENCOUNTER — Ambulatory Visit: Payer: Medicare Other | Admitting: Physical Therapy

## 2020-10-10 ENCOUNTER — Encounter: Payer: Self-pay | Admitting: Physical Medicine & Rehabilitation

## 2020-10-10 ENCOUNTER — Encounter: Payer: Medicare Other | Attending: Physical Medicine & Rehabilitation | Admitting: Physical Medicine & Rehabilitation

## 2020-10-10 ENCOUNTER — Other Ambulatory Visit: Payer: Self-pay

## 2020-10-10 VITALS — BP 124/85 | HR 88 | Temp 98.3°F | Ht 64.0 in | Wt 296.4 lb

## 2020-10-10 DIAGNOSIS — R269 Unspecified abnormalities of gait and mobility: Secondary | ICD-10-CM | POA: Diagnosis present

## 2020-10-10 DIAGNOSIS — G479 Sleep disorder, unspecified: Secondary | ICD-10-CM | POA: Insufficient documentation

## 2020-10-10 DIAGNOSIS — M5441 Lumbago with sciatica, right side: Secondary | ICD-10-CM | POA: Diagnosis not present

## 2020-10-10 DIAGNOSIS — M791 Myalgia, unspecified site: Secondary | ICD-10-CM | POA: Insufficient documentation

## 2020-10-10 DIAGNOSIS — E669 Obesity, unspecified: Secondary | ICD-10-CM | POA: Diagnosis not present

## 2020-10-10 DIAGNOSIS — G8929 Other chronic pain: Secondary | ICD-10-CM | POA: Insufficient documentation

## 2020-10-10 MED ORDER — DULOXETINE HCL 30 MG PO CPEP: 30.0000 mg | ORAL_CAPSULE | Freq: Every day | ORAL | 1 refills | Status: DC

## 2020-10-10 MED ORDER — METHOCARBAMOL 500 MG PO TABS: 500.0000 mg | ORAL_TABLET | Freq: Two times a day (BID) | ORAL | 1 refills | Status: DC | PRN

## 2020-10-10 MED ORDER — AMITRIPTYLINE HCL 10 MG PO TABS: 10.0000 mg | ORAL_TABLET | Freq: Every day | ORAL | 1 refills | Status: DC

## 2020-10-10 NOTE — Progress Notes (Signed)
Subjective:    Patient ID: Sarah Randall, female    DOB: 04/23/1966, 54 y.o.   MRN: [id number]  HPI Female with pmh/psh OSA, depression, OA, anxiety, left THA, right TKA present with back problems. Started ~10/2019. Denies inciting event.  Getting worse. Laying down improves the pain. Heat/ice help.  Limited ambulation, sitting exacerbates the pain. Sharp, shooting down right leg. Constant.  Radiates to posterior right leg to ankle.  Associated weakness. Bending forward improves pain. Gabapentin and Pregabalin caused edema. Diclofenac does not help.  OTC meds do not help. She had right L5-S1 and left L4-5 ESIs, with relief for 1 week only. Back brace helps. Denies falls. Pain limits ADLs. She saw 2 different orthos and is seeing Ortho spine and was told she had a "slipped disc" and was told she needed surgery.  Back pain worst in morning, lasting ~1 hour.    Pain Inventory Average Pain 9 Pain Right Now 9 My pain is constant, sharp, stabbing and aching  In the last 24 hours, has pain interfered with the following? General activity 1 Relation with others 4 Enjoyment of life 0 What TIME of day is your pain at its worst? morning  Sleep (in general) Poor  Pain is worse with: walking, bending, sitting, standing and some activites Pain improves with: rest and heat/ice Relief from Meds: None  use a cane how many minutes can you walk? 10 mins ability to climb steps?  yes do you drive?  yes  disabled: date disabled Disable since 2018.  depression  Any changes since last visit?  yes x-rays Spinal X-ray Dr. Marcello Moores  NEW PATIENT    Family History  Problem Relation Age of Onset  . Hypertension Mother   . Diabetes Mother   . Breast cancer Mother 59  . Hypertension Father   . Lung cancer Maternal Aunt   . Colon polyps Brother   . Colon cancer Neg Hx   . Esophageal cancer Neg Hx   . Rectal cancer Neg Hx   . Stomach cancer Neg Hx    Social History   Socioeconomic History  .  Marital status: Single    Spouse name: Not on file  . Number of children: 2  . Years of education: 9  . Highest education level: Not on file  Occupational History  . Occupation: unemployed  Tobacco Use  . Smoking status: Current Some Day Smoker    Packs/day: 0.25    Years: 30.00    Pack years: 7.50    Types: Cigarettes  . Smokeless tobacco: Never Used  Vaping Use  . Vaping Use: Never used  Substance and Sexual Activity  . Alcohol use: Yes    Alcohol/week: 4.0 - 6.0 standard drinks    Types: 4 - 6 Shots of liquor per week    Comment: occasional  . Drug use: Yes    Types: Marijuana    Comment: smoke 1 joint cannibis 1 week ago.  Marland Kitchen Sexual activity: Yes    Birth control/protection: None    Comment: declined insurance questions,des neg  Other Topics Concern  . Not on file  Social History Narrative  . Not on file   Social Determinants of Health   Financial Resource Strain:   . Difficulty of Paying Living Expenses: Not on file  Food Insecurity:   . Worried About Charity fundraiser in the Last Year: Not on file  . Ran Out of Food in the Last Year: Not on file  Transportation  Needs:   . Lack of Transportation (Medical): Not on file  . Lack of Transportation (Non-Medical): Not on file  Physical Activity:   . Days of Exercise per Week: Not on file  . Minutes of Exercise per Session: Not on file  Stress:   . Feeling of Stress : Not on file  Social Connections:   . Frequency of Communication with Friends and Family: Not on file  . Frequency of Social Gatherings with Friends and Family: Not on file  . Attends Religious Services: Not on file  . Active Member of Clubs or Organizations: Not on file  . Attends Archivist Meetings: Not on file  . Marital Status: Not on file   Past Surgical History:  Procedure Laterality Date  . LEFT HEART CATH AND CORONARY ANGIOGRAPHY N/A 10/25/2018   Procedure: LEFT HEART CATH AND CORONARY ANGIOGRAPHY;  Surgeon: Martinique, Peter M, MD;   Location: [facility];  Service: Cardiovascular;  Laterality: N/A;  . ROBOTIC ASSISTED LAPAROSCOPIC HYSTERECTOMY AND SALPINGECTOMY Bilateral 12/27/2019   Procedure: XI ROBOTIC ASSISTED TOTAL LAPAROSCOPIC HYSTERECTOMY AND SALPINGECTOMY;  Surgeon: Princess Bruins, MD;  Location: [place];  Service: Gynecology;  Laterality: Bilateral;  . TOTAL HIP ARTHROPLASTY Left 09/22/2017   Procedure: LEFT TOTAL HIP ARTHROPLASTY ANTERIOR APPROACH;  Surgeon: Leandrew Koyanagi, MD;  Location: [place];  Service: Orthopedics;  Laterality: Left;  . TOTAL KNEE ARTHROPLASTY Right 01/12/2018   Procedure: RIGHT TOTAL KNEE ARTHROPLASTY;  Surgeon: Leandrew Koyanagi, MD;  Location: [place];  Service: Orthopedics;  Laterality: Right;  . TUBAL LIGATION     Past Medical History:  Diagnosis Date  . Anemia   . Anxiety   . Arthritis   . B12 deficiency 06/30/2019  . Depression   . GERD (gastroesophageal reflux disease)    occasionally takes OTC  . Hyperlipidemia   . Hypertension   . Pre-diabetes   . Sleep apnea    tested 2014 - unable to tolerate machine   BP 124/85   Pulse 88   Temp 98.3 F (36.8 C)   Ht 5\' 4"  (1.626 m)   Wt 296 lb 6.4 oz (134.4 kg)   LMP  (LMP Unknown)   SpO2 98%   BMI 50.88 kg/m   Opioid Risk Score:   Fall Risk Score:  `1  Depression screen PHQ 2/9  Depression screen [facility] 2/9 10/10/2020 09/16/2020 09/07/2019 06/20/2019 11/10/2018 10/10/2018 08/30/2017  Decreased Interest 0 2 0 1 1 2 3   Down, Depressed, Hopeless 0 2 0 1 2 2 2   PHQ - 2 Score 0 4 0 2 3 4 5   Altered sleeping - 1 - 0 2 1 3   Tired, decreased energy - 1 - 1 1 2 2   Change in appetite - 1 - 0 2 1 1   Feeling bad or failure about yourself  - 1 - 1 1 1 2   Trouble concentrating - 1 - 1 2 1 2   Moving slowly or fidgety/restless - 0 - 0 1 0 1  Suicidal thoughts - 1 - 0 0 1 1  PHQ-9 Score - 10 - 5 12 11 17    Review of Systems  Constitutional: Positive for unexpected weight change.       Weight gain  HENT: Negative.     Eyes: Negative.   Respiratory: Negative.   Cardiovascular: Negative.   Gastrointestinal: Negative.   Endocrine: Negative.   Genitourinary: Negative.   Musculoskeletal: Positive for arthralgias, back pain, gait problem and myalgias.  Buttock & Right leg pain  Skin: Negative.   Allergic/Immunologic: Negative.   Neurological: Positive for weakness. Negative for numbness.  Hematological: Negative.   Psychiatric/Behavioral: Negative.   All other systems reviewed and are negative.     Objective:   Physical Exam Constitutional: No distress . Vital signs reviewed. Morbidly obese. HENT: Normocephalic.  Atraumatic. Eyes: EOMI. No discharge. Cardiovascular: No JVD.   Respiratory: Normal effort.  No stridor.   GI: Non-distended.   Skin: Warm and dry.  Intact. Psych: Normal mood.  Normal behavior. Musc: No edema in extremities.  No tenderness in extremities. +TTP along central upper lumbar spinous process along folds +TTP along gluteal muscles Neuro: Alert Motor: B/l LE: 5/5 throughout Sensation intact to light touch Neg seated SLR b/l    Assessment & Plan:  Female with pmh/psh OSA, depression, OA, anxiety, left THA, right TKA present with back problems.   1. Chronic mechanical low back pain - multifactorial, stenosis + facet arthropathy, myalgia +/- discogenic +/- piriformis syndrome +/- sciatica  MRI L-spine 12/2019 reviewed, revealing thoracolumber degeneration, mild/mod stenosis L3-5, foraminal stenosis left L4-5, mild spinal stenosis T11-12, facet arthropathy.  Labs reviewed  Referral information reviewed - back pain  PMAWARE reviewed  Side effects with Gabapentin, Lyrica  Continue Heat/Cold  She completed PT without benefit  Will refer to aquatic therapy with trial TENS  Bracing per Ortho spine  Trial OTC Lidocaine patch  Trial Cymbalta 30mg  with food  Will order Robaxin 500 BID PRN  Patient states main goal is to do ADLs  Cont to follow up with Ortho (recommending  surgery)  Will refer to Rheum due to description and multiple joint issures  Piriformis exercises provided    2. Gait abnormality  Encouraged use of cane  3. Sleep disturbance  Will order Elavil 10qhs, educated on signs/symptoms of serotonin syndrome   4. Super Obesity  Will refer to dietitian  Significant contributor  5. Myalgia   Will consider trigger point injections  >60 minutes spent in total in counseling regarding pain, treatment options, exercises, reviewed records, labs, and imaging

## 2020-10-10 NOTE — Patient Instructions (Addendum)
[email or web address].info/  Trial OTC Lidocaine patch

## 2020-10-23 ENCOUNTER — Telehealth: Payer: Self-pay

## 2020-10-23 DIAGNOSIS — M2550 Pain in unspecified joint: Secondary | ICD-10-CM

## 2020-10-23 NOTE — Telephone Encounter (Signed)
Sarah Randall. Sass c/o a constant level 7/10 pain from her lower back to her buttock and down the right leg.  Current pain medications are not helping the discomfort. The only relief she gets is when she lays down.  Please advise.  Thank you

## 2020-10-23 NOTE — Telephone Encounter (Signed)
I am  sorry to hear that.  Has she been to aquatic therapy?  Has she tried the TENS unit? Has she tried the lidocaine patch? Has she seen Rheum? Is se doing the exercises provided? Thanks.

## 2020-10-28 ENCOUNTER — Telehealth: Payer: Self-pay

## 2020-10-28 NOTE — Telephone Encounter (Signed)
A referral was placed for the second time.

## 2020-10-28 NOTE — Telephone Encounter (Signed)
Sarah Randall stated the exercises helps some but the pain returns. She has not tried the Aquatic Therapy. And would like you to place the referral to Rheumatology.

## 2020-10-28 NOTE — Addendum Note (Signed)
Addended by: Delice Lesch A on: 10/28/2020 11:48 PM   Modules accepted: Orders

## 2020-10-29 ENCOUNTER — Other Ambulatory Visit: Payer: Self-pay

## 2020-10-30 ENCOUNTER — Other Ambulatory Visit: Payer: Self-pay | Admitting: Neurosurgery

## 2020-11-07 ENCOUNTER — Encounter: Payer: Medicare Other | Attending: Physical Medicine & Rehabilitation | Admitting: Physical Medicine & Rehabilitation

## 2020-11-07 ENCOUNTER — Other Ambulatory Visit: Payer: Self-pay

## 2020-11-07 ENCOUNTER — Encounter: Payer: Self-pay | Admitting: Physical Medicine & Rehabilitation

## 2020-11-07 VITALS — BP 140/94 | HR 104 | Temp 98.9°F | Ht 64.0 in | Wt 286.6 lb

## 2020-11-07 DIAGNOSIS — R269 Unspecified abnormalities of gait and mobility: Secondary | ICD-10-CM | POA: Insufficient documentation

## 2020-11-07 DIAGNOSIS — G894 Chronic pain syndrome: Secondary | ICD-10-CM | POA: Insufficient documentation

## 2020-11-07 DIAGNOSIS — G8929 Other chronic pain: Secondary | ICD-10-CM | POA: Diagnosis present

## 2020-11-07 DIAGNOSIS — E669 Obesity, unspecified: Secondary | ICD-10-CM | POA: Insufficient documentation

## 2020-11-07 DIAGNOSIS — M5441 Lumbago with sciatica, right side: Secondary | ICD-10-CM | POA: Insufficient documentation

## 2020-11-07 DIAGNOSIS — M2550 Pain in unspecified joint: Secondary | ICD-10-CM | POA: Diagnosis not present

## 2020-11-07 DIAGNOSIS — G479 Sleep disorder, unspecified: Secondary | ICD-10-CM | POA: Diagnosis not present

## 2020-11-07 DIAGNOSIS — M5416 Radiculopathy, lumbar region: Secondary | ICD-10-CM | POA: Diagnosis present

## 2020-11-07 MED ORDER — DULOXETINE HCL 60 MG PO CPEP: 60.0000 mg | ORAL_CAPSULE | Freq: Every day | ORAL | 2 refills | Status: DC

## 2020-11-07 MED ORDER — METHOCARBAMOL 500 MG PO TABS: 1000.0000 mg | ORAL_TABLET | Freq: Three times a day (TID) | ORAL | 2 refills | Status: DC | PRN

## 2020-11-07 MED ORDER — AMITRIPTYLINE HCL 25 MG PO TABS: 25.0000 mg | ORAL_TABLET | Freq: Every day | ORAL | 2 refills | Status: DC

## 2020-11-07 NOTE — Progress Notes (Addendum)
Subjective:    Patient ID: Sarah Randall, female    DOB: 12-22-1965, 54 y.o.   MRN: [id number]  HPI Female with pmh/psh OSA, depression, OA, anxiety, left THA, right TKA present with back problems.  Initially stated: Started ~10/2019. Denies inciting event.  Getting worse. Laying down improves the pain. Heat/ice help.  Limited ambulation, sitting exacerbates the pain. Sharp, shooting down right leg. Constant.  Radiates to posterior right leg to ankle.  Associated weakness. Bending forward improves pain. Gabapentin and Pregabalin caused edema. Diclofenac does not help.  OTC meds do not help. She had right L5-S1 and left L4-5 ESIs, with relief for 1 week only. Back brace helps. Denies falls. Pain limits ADLs. She saw 2 different orthos and is seeing Ortho spine and was told she had a "slipped disc" and was told she needed surgery.  Back pain worst in morning, lasting ~1 hour.    Last clinic visit on 10/10/20. Since that time, pt states she has not been driving, so she has not went to pool therapy. She is interested in going to the [facility]. She is doing epson salt baths with benefit.  She could not afford Nutritionist. No benefit with Lidoderm patch. Cymabalta without benefit. No benefit with muscle relaxers. She falls asleep, but wakes up, benefit with Elavil.  She is scheduled to have back surgery next month. She sees Rheum next month. Denies falls. She states benefit with Piriformis excercises provided benefit, but pain came back.   Pain Inventory Average Pain 9 Pain Right Now 7 My pain is constant, sharp, stabbing and aching  In the last 24 hours, has pain interfered with the following? General activity 7 Relation with others 1 Enjoyment of life 0 What TIME of day is your pain at its worst? morning  and night Sleep (in general) Poor  Pain is worse with: walking, bending, sitting, standing and some activites Pain improves with: rest and heat/ice Relief from Meds: None   Family History   Problem Relation Age of Onset  . Hypertension Mother   . Diabetes Mother   . Breast cancer Mother 68  . Hypertension Father   . Lung cancer Maternal Aunt   . Colon polyps Brother   . Colon cancer Neg Hx   . Esophageal cancer Neg Hx   . Rectal cancer Neg Hx   . Stomach cancer Neg Hx    Social History   Socioeconomic History  . Marital status: Single    Spouse name: Not on file  . Number of children: 2  . Years of education: 9  . Highest education level: Not on file  Occupational History  . Occupation: unemployed  Tobacco Use  . Smoking status: Current Every Day Smoker    Packs/day: 0.25    Years: 30.00    Pack years: 7.50    Types: Cigarettes  . Smokeless tobacco: Never Used  . Tobacco comment: Smokes 4 cigarettes per day  Vaping Use  . Vaping Use: Never used  Substance and Sexual Activity  . Alcohol use: Yes    Alcohol/week: 4.0 - 6.0 standard drinks    Types: 4 - 6 Shots of liquor per week    Comment: occasional  . Drug use: Yes    Types: Marijuana    Comment: smoke 1 joint cannibis 1 week ago.  Marland Kitchen Sexual activity: Yes    Birth control/protection: None    Comment: declined insurance questions,des neg  Other Topics Concern  . Not on file  Social  History Narrative  . Not on file   Social Determinants of Health   Financial Resource Strain:   . Difficulty of Paying Living Expenses: Not on file  Food Insecurity:   . Worried About Charity fundraiser in the Last Year: Not on file  . Ran Out of Food in the Last Year: Not on file  Transportation Needs:   . Lack of Transportation (Medical): Not on file  . Lack of Transportation (Non-Medical): Not on file  Physical Activity:   . Days of Exercise per Week: Not on file  . Minutes of Exercise per Session: Not on file  Stress:   . Feeling of Stress : Not on file  Social Connections:   . Frequency of Communication with Friends and Family: Not on file  . Frequency of Social Gatherings with Friends and Family: Not on  file  . Attends Religious Services: Not on file  . Active Member of Clubs or Organizations: Not on file  . Attends Archivist Meetings: Not on file  . Marital Status: Not on file   Past Surgical History:  Procedure Laterality Date  . LEFT HEART CATH AND CORONARY ANGIOGRAPHY N/A 10/25/2018   Procedure: LEFT HEART CATH AND CORONARY ANGIOGRAPHY;  Surgeon: Martinique, Peter M, MD;  Location: [place] [facility];  Service: Cardiovascular;  Laterality: N/A;  . ROBOTIC ASSISTED LAPAROSCOPIC HYSTERECTOMY AND SALPINGECTOMY Bilateral 12/27/2019   Procedure: XI ROBOTIC ASSISTED TOTAL LAPAROSCOPIC HYSTERECTOMY AND SALPINGECTOMY;  Surgeon: Princess Bruins, MD;  Location: [place];  Service: Gynecology;  Laterality: Bilateral;  . TOTAL HIP ARTHROPLASTY Left 09/22/2017   Procedure: LEFT TOTAL HIP ARTHROPLASTY ANTERIOR APPROACH;  Surgeon: Leandrew Koyanagi, MD;  Location: [place];  Service: Orthopedics;  Laterality: Left;  . TOTAL KNEE ARTHROPLASTY Right 01/12/2018   Procedure: RIGHT TOTAL KNEE ARTHROPLASTY;  Surgeon: Leandrew Koyanagi, MD;  Location: [place];  Service: Orthopedics;  Laterality: Right;  . TUBAL LIGATION     Past Medical History:  Diagnosis Date  . Anemia   . Anxiety   . Arthritis   . B12 deficiency 06/30/2019  . Depression   . GERD (gastroesophageal reflux disease)    occasionally takes OTC  . Hyperlipidemia   . Hypertension   . Low back pain   . Obesity   . Pain in right buttock    Goes down to Right leg to lateral aspect of calf.  . Pre-diabetes   . Sleep apnea    tested 2014 - unable to tolerate machine  . Spondylolisthesis at L4-L5 level    BP (!) 140/94   Pulse (!) 104   Temp 98.9 F (37.2 C)   Ht 5\' 4"  (1.626 m)   Wt 286 lb 9.6 oz (130 kg)   LMP  (LMP Unknown)   SpO2 97%   BMI 49.19 kg/m   Opioid Risk Score:   Fall Risk Score:  `1  Depression screen PHQ 2/9  Depression screen [facility] 2/9 10/10/2020 09/16/2020 09/07/2019 06/20/2019 11/10/2018 10/10/2018  08/30/2017  Decreased Interest 0 2 0 1 1 2 3   Down, Depressed, Hopeless 0 2 0 1 2 2 2   PHQ - 2 Score 0 4 0 2 3 4 5   Altered sleeping - 1 - 0 2 1 3   Tired, decreased energy - 1 - 1 1 2 2   Change in appetite - 1 - 0 2 1 1   Feeling bad or failure about yourself  - 1 - 1 1 1 2   Trouble  concentrating - 1 - 1 2 1 2   Moving slowly or fidgety/restless - 0 - 0 1 0 1  Suicidal thoughts - 1 - 0 0 1 1  PHQ-9 Score - 10 - 5 12 11 17    Review of Systems  Constitutional: Positive for unexpected weight change.       Weight gain  HENT: Negative.   Eyes: Negative.   Respiratory: Negative.   Cardiovascular: Negative.   Gastrointestinal: Negative.   Endocrine: Negative.   Genitourinary: Negative.   Musculoskeletal: Positive for arthralgias, back pain, gait problem and myalgias.       Buttock & Right leg pain  Skin: Negative.   Allergic/Immunologic: Negative.   Neurological: Positive for weakness. Negative for numbness.  Hematological: Negative.   Psychiatric/Behavioral: Negative.   All other systems reviewed and are negative.     Objective:   Physical Exam  Constitutional: No distress . Vital signs reviewed. HENT: Normocephalic.  Atraumatic. Eyes: EOMI. No discharge. Cardiovascular: No JVD.   Respiratory: Normal effort.  No stridor.   GI: Non-distended.   Skin: Warm and dry.  Intact. Psych: Normal mood.  Normal behavior. Musc: Multiple joint pain.  +TTP along central upper lumbar spinous process along folds +TTP along gluteal muscles Gait: Antalgic Neuro: Alert Motor: B/l LE: 5/5 throughout    Assessment & Plan:  Female with pmh/psh OSA, depression, OA, anxiety, left THA, right TKA present with back problems.   1. Chronic mechanical low back pain - multifactorial, stenosis + facet arthropathy, myalgia +/- discogenic +/- piriformis syndrome +/- sciatica  MRI L-spine 12/2019 reviewed, revealing thoracolumber degeneration, mild/mod stenosis L3-5, foraminal stenosis left L4-5, mild spinal  stenosis T11-12, facet arthropathy.  Side effects with Gabapentin, Lyrica  No benefit with Lidocaine patch  Continue Heat/Cold  She completed PT without benefit  Will refer to aquatic therapy with trial TENS, encouraged follow up, may go to [facility] as alternative  Bracing per Ortho spine  Will increase Cymbalta to 60mg  with food  Will increase Robaxin to 100 BID PRN  Patient states main goal is to do ADLs  Plan for surgery next month  Rheum referral next month due to description and multiple joint issures  Continue Piriformis exercises    2. Gait abnormality  Continue cane  3. Sleep disturbance  Will increase Elavil to 25qhs, educated on signs/symptoms of serotonin syndrome again  4. Super Obesity  Unable to afford dietitian  Significant contributor  5. Myalgia   Will consider trigger point injections

## 2020-11-08 ENCOUNTER — Other Ambulatory Visit: Payer: Self-pay | Admitting: Internal Medicine

## 2020-11-08 ENCOUNTER — Other Ambulatory Visit: Payer: Self-pay | Admitting: Neurosurgery

## 2020-11-08 ENCOUNTER — Other Ambulatory Visit: Payer: Self-pay

## 2020-11-08 DIAGNOSIS — G43011 Migraine without aura, intractable, with status migrainosus: Secondary | ICD-10-CM

## 2020-11-08 DIAGNOSIS — G250 Essential tremor: Secondary | ICD-10-CM

## 2020-11-08 MED ORDER — PROPRANOLOL HCL 10 MG PO TABS: 10.0000 mg | ORAL_TABLET | Freq: Two times a day (BID) | ORAL | 0 refills | Status: DC

## 2020-11-08 NOTE — Telephone Encounter (Signed)
Medication Refill - Medication: Propanolol   Has the patient contacted their pharmacy? Yes.   (Agent: If no, request that the patient contact the pharmacy for the refill.) (Agent: If yes, when and what did the pharmacy advise?)  Preferred Pharmacy (with phone number or street name):  [id number] [name], [address] [place]  [place] [address]  Phone: [phone] Fax: [phone]  Hours: Not open 24 hours     Agent: Please be advised that RX refills may take up to 3 business days. We ask that you follow-up with your pharmacy.

## 2020-11-09 ENCOUNTER — Other Ambulatory Visit: Payer: Self-pay | Admitting: Internal Medicine

## 2020-11-09 DIAGNOSIS — I10 Essential (primary) hypertension: Secondary | ICD-10-CM

## 2020-11-09 NOTE — Telephone Encounter (Signed)
Requested Prescriptions  Pending Prescriptions Disp Refills  . losartan (COZAAR) 50 MG tablet [Pharmacy Med Name: LOSARTAN POTASSIUM 50 MG TAB] 90 tablet 0    Sig: TAKE 1 TABLET BY MOUTH EVERY DAY     Cardiovascular:  Angiotensin Receptor Blockers Failed - 11/09/2020 11:29 AM      Failed - Cr in normal range and within 180 days    Creatinine  Date Value Ref Range Status  06/15/2019 0.94 0.44 - 1.00 mg/dL Final  08/10/2017 150.1 20.0 - 300.0 mg/dL Final   Creatinine, Ser  Date Value Ref Range Status  09/06/2020 1.06 (H) 0.44 - 1.00 mg/dL Final         Failed - Last BP in normal range    BP Readings from Last 1 Encounters:  11/07/20 (!) 140/94         Passed - K in normal range and within 180 days    Potassium  Date Value Ref Range Status  09/06/2020 3.8 3.5 - 5.1 mmol/L Final         Passed - Patient is not pregnant      Passed - Valid encounter within last 6 months    Recent Outpatient Visits          1 month ago Need for influenza vaccination   [place], Jarome Matin, RPH-CPP   1 month ago Essential hypertension   [place], MD   10 months ago Rhinovirus   [place], [place], NP   11 months ago [place], Deborah B, MD   1 year ago Symptomatic hypotension   [place], Deborah B, MD      Future Appointments            In 1 month Hilty, Nadean Corwin, MD [place], [facility]

## 2020-11-15 ENCOUNTER — Telehealth: Payer: Self-pay | Admitting: *Deleted

## 2020-11-15 NOTE — Telephone Encounter (Signed)
Sarah Randall called and reports that the Duloxetine increase is making her nauseous and she is having bad headaches.  I told her that you were out of the office until Monday. She has the 30 mg so I told her to drop back down to the 30 mg dose until Dr Posey Pronto can advise differently.  She notices the nausea begins after she takes the higher does even though she makes sure to have food in her stomach. Please advise.

## 2020-11-18 MED ORDER — DULOXETINE HCL 30 MG PO CPEP: 30.0000 mg | ORAL_CAPSULE | Freq: Every day | ORAL | 2 refills | Status: DC

## 2020-11-18 NOTE — Telephone Encounter (Signed)
Notified. 

## 2020-11-18 NOTE — Telephone Encounter (Signed)
Thank you.  I agree with decreasing her Duloxetine back down to 30mg . Thanks.

## 2020-11-18 NOTE — Addendum Note (Signed)
Addended by: Caro Hight on: 11/18/2020 03:06 PM   Modules accepted: Orders

## 2020-11-19 NOTE — Telephone Encounter (Signed)
Sent!

## 2020-11-26 ENCOUNTER — Other Ambulatory Visit: Payer: Self-pay

## 2020-11-26 ENCOUNTER — Ambulatory Visit (INDEPENDENT_AMBULATORY_CARE_PROVIDER_SITE_OTHER): Payer: Medicare Other | Admitting: Vascular Surgery

## 2020-11-26 ENCOUNTER — Encounter: Payer: Self-pay | Admitting: Vascular Surgery

## 2020-11-26 VITALS — BP 110/84 | HR 86 | Temp 98.0°F | Resp 20 | Ht 64.0 in | Wt 286.0 lb

## 2020-11-26 DIAGNOSIS — M5416 Radiculopathy, lumbar region: Secondary | ICD-10-CM | POA: Diagnosis not present

## 2020-11-26 NOTE — Progress Notes (Signed)
Patient name: Sarah Randall MRN: [id number] DOB: 02-26-1966 Sex: female  REASON FOR CONSULT: Evaluate for L4-L5 OLIF  HPI: Sarah Randall is a 54 y.o. female, with history of hypertension, hyperlipidemia, tobacco abuse, prediabetes, chronic back pain who presents for evaluation of a L4-L5 OLIF.  Patient describes several years of back pain with bilateral lower extremity radiculopathy.  She has had nonsurgical therapy that has ultimately failed and she has had continued progression of her pain that is limiting her quality of life.  She has been seen by Dr. Marcello Moores with [place] [facility] who was ultimately recommended an L4-L5 OLIF with additional percutaneous posterior instrumentation.  Vascular surgery was asked to evaluate for L4-L5 OLIF abdominal exposure.  Previous abdominal surgery includes previous tubal ligation and a robotic assisted hysterectomy.  She does smoke about 4 cigarettes a day.  Past Medical History:  Diagnosis Date  . Anemia   . Anxiety   . Arthritis   . B12 deficiency 06/30/2019  . Depression   . GERD (gastroesophageal reflux disease)    occasionally takes OTC  . Hyperlipidemia   . Hypertension   . Low back pain   . Obesity   . Pain in right buttock    Goes down to Right leg to lateral aspect of calf.  . Pre-diabetes   . Sleep apnea    tested 2014 - unable to tolerate machine  . Spondylolisthesis at L4-L5 level     Past Surgical History:  Procedure Laterality Date  . LEFT HEART CATH AND CORONARY ANGIOGRAPHY N/A 10/25/2018   Procedure: LEFT HEART CATH AND CORONARY ANGIOGRAPHY;  Surgeon: Martinique, Peter M, MD;  Location: [facility];  Service: Cardiovascular;  Laterality: N/A;  . ROBOTIC ASSISTED LAPAROSCOPIC HYSTERECTOMY AND SALPINGECTOMY Bilateral 12/27/2019   Procedure: XI ROBOTIC ASSISTED TOTAL LAPAROSCOPIC HYSTERECTOMY AND SALPINGECTOMY;  Surgeon: Princess Bruins, MD;  Location: [facility];  Service: Gynecology;  Laterality:  Bilateral;  . TOTAL HIP ARTHROPLASTY Left 09/22/2017   Procedure: LEFT TOTAL HIP ARTHROPLASTY ANTERIOR APPROACH;  Surgeon: Leandrew Koyanagi, MD;  Location: [facility];  Service: Orthopedics;  Laterality: Left;  . TOTAL KNEE ARTHROPLASTY Right 01/12/2018   Procedure: RIGHT TOTAL KNEE ARTHROPLASTY;  Surgeon: Leandrew Koyanagi, MD;  Location: [facility];  Service: Orthopedics;  Laterality: Right;  . TUBAL LIGATION      Family History  Problem Relation Age of Onset  . Hypertension Mother   . Diabetes Mother   . Breast cancer Mother 19  . Hypertension Father   . Lung cancer Maternal Aunt   . Colon polyps Brother   . Colon cancer Neg Hx   . Esophageal cancer Neg Hx   . Rectal cancer Neg Hx   . Stomach cancer Neg Hx     SOCIAL HISTORY: Social History   Socioeconomic History  . Marital status: Single    Spouse name: Not on file  . Number of children: 2  . Years of education: 9  . Highest education level: Not on file  Occupational History  . Occupation: unemployed  Tobacco Use  . Smoking status: Current Every Day Smoker    Packs/day: 0.25    Years: 30.00    Pack years: 7.50    Types: Cigarettes  . Smokeless tobacco: Never Used  . Tobacco comment: Smokes 4 cigarettes per day  Vaping Use  . Vaping Use: Never used  Substance and Sexual Activity  . Alcohol use: Yes    Alcohol/week: 4.0 - 6.0 standard  drinks    Types: 4 - 6 Shots of liquor per week    Comment: occasional  . Drug use: Yes    Types: Marijuana    Comment: smoke 1 joint cannibis 1 week ago.  Marland Kitchen Sexual activity: Yes    Birth control/protection: None    Comment: declined insurance questions,des neg  Other Topics Concern  . Not on file  Social History Narrative  . Not on file   Social Determinants of Health   Financial Resource Strain: Not on file  Food Insecurity: Not on file  Transportation Needs: Not on file  Physical Activity: Not on file  Stress: Not on file  Social Connections: Not on file  Intimate Partner Violence:  Not on file    Allergies  Allergen Reactions  . Pregabalin Swelling  . Gabapentin Swelling  . Lipitor [Atorvastatin] Other (See Comments)    Stabbing pains in legs  . Pravachol [Pravastatin]     Sharp pains in LT leg  . Zoloft [Sertraline Hcl] Other (See Comments)    tremors    Current Outpatient Medications  Medication Sig Dispense Refill  . amitriptyline (ELAVIL) 25 MG tablet Take 1 tablet (25 mg total) by mouth at bedtime. 30 tablet 2  . diclofenac (CATAFLAM) 50 MG tablet Take 50 mg by mouth 2 (two) times daily.    Marland Kitchen esomeprazole (NEXIUM) 40 MG capsule TAKE 1 CAPSULE BY MOUTH EVERY DAY 90 capsule 1  . hydrochlorothiazide (HYDRODIURIL) 25 MG tablet Take 1 tablet (25 mg total) by mouth daily. 30 tablet 4  . loratadine (CLARITIN) 10 MG tablet TAKE 1 TABLET BY MOUTH EVERY DAY 30 tablet 2  . losartan (COZAAR) 50 MG tablet TAKE 1 TABLET BY MOUTH EVERY DAY 90 tablet 0  . methocarbamol (ROBAXIN) 500 MG tablet Take 2 tablets (1,000 mg total) by mouth every 8 (eight) hours as needed for muscle spasms. 180 tablet 2  . propranolol (INDERAL) 10 MG tablet Take 1 tablet (10 mg total) by mouth 2 (two) times daily. 180 tablet 0  . DULoxetine (CYMBALTA) 30 MG capsule Take 1 capsule (30 mg total) by mouth daily. (Patient not taking: Reported on 11/26/2020) 30 capsule 2  . nitroGLYCERIN (NITROSTAT) 0.4 MG SL tablet Place 1 tablet (0.4 mg total) under the tongue every 5 (five) minutes as needed. 25 tablet 11   No current facility-administered medications for this visit.    REVIEW OF SYSTEMS:  [X]  denotes positive finding, [ ]  denotes negative finding Cardiac  Comments:  Chest pain or chest pressure:    Shortness of breath upon exertion:    Short of breath when lying flat:    Irregular heart rhythm:        Vascular    Pain in calf, thigh, or hip brought on by ambulation:    Pain in feet at night that wakes you up from your sleep:     Blood clot in your veins:    Leg swelling:          Pulmonary    Oxygen at home:    Productive cough:     Wheezing:         Neurologic    Sudden weakness in arms or legs:     Sudden numbness in arms or legs:     Sudden onset of difficulty speaking or slurred speech:    Temporary loss of vision in one eye:     Problems with dizziness:         Gastrointestinal  Blood in stool:     Vomited blood:         Genitourinary    Burning when urinating:     Blood in urine:        Psychiatric    Major depression:         Hematologic    Bleeding problems:    Problems with blood clotting too easily:        Skin    Rashes or ulcers:        Constitutional    Fever or chills:      PHYSICAL EXAM: Vitals:   11/26/20 1442  BP: 110/84  Pulse: 86  Resp: 20  Temp: 98 F (36.7 C)  SpO2: 96%  Weight: 286 lb (129.7 kg)  Height: 5\' 4"  (1.626 m)    GENERAL: The patient is a well-nourished female, in no acute distress. The vital signs are documented above. CARDIAC: There is a regular rate and rhythm.  VASCULAR:  1+ palpable femoral pulses bilaterally 1+ palpable dorsalis pedis pulses bilaterally PULMONARY: There is good air exchange bilaterally without wheezing or rales. ABDOMEN: Soft and non-tender. Obese. MUSCULOSKELETAL: There are no major deformities or cyanosis. NEUROLOGIC: No focal weakness or paresthesias are detected. SKIN: There are no ulcers or rashes noted. PSYCHIATRIC: The patient has a normal affect.  DATA:   I independently reviewed her MRI lumbar spine on 12/21/2019 and reviewed her anatomy and the the L4-L5 disc space.  Assessment/Plan:  54 year old female presents for evaluation of L4-L5 OLIF.  I discussed with the patient steps of surgery including a right lateral positioning in the OR and incision over her left oblique muscles laterally where the L4-L5 disc space has been marked.  I talked about basically moving the left ureter and intestitnes to expose the L4-L5 disc space from oblique approach and this would  likely require mobilizing the psoas muscle lateral as well as iliac vein and artery medial to get to the disc space.  Risk and benefits were discussed including injury to the iliac artery and vein as well as other structures that require mobilizing.  This will be more challenging given she has a BMI of 49 and is morbidly obese.   Marty Heck, MD [facility] Office: [phone]

## 2020-11-30 ENCOUNTER — Other Ambulatory Visit: Payer: Self-pay | Admitting: Physical Medicine & Rehabilitation

## 2020-12-04 ENCOUNTER — Other Ambulatory Visit: Payer: Self-pay | Admitting: Physical Medicine & Rehabilitation

## 2020-12-10 ENCOUNTER — Encounter: Payer: Self-pay | Admitting: Internal Medicine

## 2020-12-10 ENCOUNTER — Ambulatory Visit (INDEPENDENT_AMBULATORY_CARE_PROVIDER_SITE_OTHER): Payer: Medicare Other | Admitting: Internal Medicine

## 2020-12-10 ENCOUNTER — Other Ambulatory Visit: Payer: Self-pay

## 2020-12-10 VITALS — BP 126/86 | HR 95 | Ht 64.0 in | Wt 289.0 lb

## 2020-12-10 DIAGNOSIS — R768 Other specified abnormal immunological findings in serum: Secondary | ICD-10-CM

## 2020-12-10 DIAGNOSIS — M65312 Trigger thumb, left thumb: Secondary | ICD-10-CM | POA: Diagnosis not present

## 2020-12-10 DIAGNOSIS — M5416 Radiculopathy, lumbar region: Secondary | ICD-10-CM

## 2020-12-10 DIAGNOSIS — M7062 Trochanteric bursitis, left hip: Secondary | ICD-10-CM | POA: Diagnosis not present

## 2020-12-10 NOTE — Patient Instructions (Signed)
Trigger Finger  Trigger finger, also called stenosing tenosynovitis,  is a condition that causes a finger to get stuck in a bent position. Each finger has a tendon, which is a tough, cord-like tissue that connects muscle to bone, and each tendon passes through a tunnel of tissue called a tendon sheath. To move your finger, your tendon needs to glide freely through the sheath. Trigger finger happens when the tendon or the sheath thickens, making it difficult to move your finger. Trigger finger can affect any finger or a thumb. It may affect more than one finger. Mild cases may clear up with rest and medicine. Severe cases require more treatment. What are the causes? Trigger finger is caused by a thickened finger tendon or tendon sheath. The cause of this thickening is not known. What increases the risk? The following factors may make you more likely to develop this condition:  Doing activities that require a strong grip.  Having rheumatoid arthritis, gout, or diabetes.  Being 90-71 years old.  Being female. What are the signs or symptoms? Symptoms of this condition include:  Pain when bending or straightening your finger.  Tenderness or swelling where your finger attaches to the palm of your hand.  A lump in the palm of your hand or on the inside of your finger.  Hearing a noise like a pop or a snap when you try to straighten your finger.  Feeling a catching or locking sensation when you try to straighten your finger.  Being unable to straighten your finger. How is this diagnosed? This condition is diagnosed based on your symptoms and a physical exam. How is this treated? This condition may be treated by:  Resting your finger and avoiding activities that make symptoms worse.  Wearing a finger splint to keep your finger extended.  Taking NSAIDs, such as ibuprofen, to relieve pain and swelling.  Doing gentle exercises to stretch the finger as told by your health care  provider.  Having medicine that reduces swelling and inflammation (steroids) injected into the tendon sheath. Injections may need to be repeated.  Having surgery to open the tendon sheath. This may be done if other treatments do not work and you cannot straighten your finger. You may need physical therapy after surgery. Follow these instructions at home: If you have a splint:  Wear the splint as told by your health care provider. Remove it only as told by your health care provider.  Loosen it if your fingers tingle, become numb, or turn cold and blue.  Keep it clean.  If the splint is not waterproof: ? Do not let it get wet. ? Cover it with a watertight covering when you take a bath or shower. Managing pain, stiffness, and swelling     If directed, apply heat to the affected area as often as told by your health care provider. Use the heat source that your health care provider recommends, such as a moist heat pack or a heating pad.  Place a towel between your skin and the heat source.  Leave the heat on for 20-30 minutes.  Remove the heat if your skin turns bright red. This is especially important if you are unable to feel pain, heat, or cold. You may have a greater risk of getting burned. If directed, put ice on the painful area. To do this:  If you have a removable splint, remove it as told by your health care provider.  Put ice in a plastic bag.  Place a  towel between your skin and the bag or between your splint and the bag.  Leave the ice on for 20 minutes, 2-3 times a day.  Activity  Rest your finger as told by your health care provider. Avoid activities that make the pain worse.  Return to your normal activities as told by your health care provider. Ask your health care provider what activities are safe for you.  Do exercises as told by your health care provider.  Ask your health care provider when it is safe to drive if you have a splint on your hand. General  instructions  Take over-the-counter and prescription medicines only as told by your health care provider.  Keep all follow-up visits as told by your health care provider. This is important. Contact a health care provider if:  Your symptoms are not improving with home care. Summary  Trigger finger, also called stenosing tenosynovitis, causes your finger to get stuck in a bent position. This can make it difficult and painful to straighten your finger.  This condition develops when a finger tendon or tendon sheath thickens.  Treatment may include resting your finger, wearing a splint, and taking medicines.  In severe cases, surgery to open the tendon sheath may be needed.

## 2020-12-10 NOTE — Progress Notes (Signed)
Office Visit Note  Patient: Sarah Randall             Date of Birth: [birth year]           MRN: [id number]             PCP: Marcine Matar, MD Referring: Marcello Fennel, MD Visit Date: 12/10/2020   Subjective:  New Patient (Initial Visit) and Joint Pain (Left thumb; right upper arm/shoulder; lower back; left knee)   History of Present Illness: Sarah Randall is a 55 y.o. female here for evaluation of pain in multiple sites especially back and lower extremities. Her pain is longstanding involving the shoulders, low back, and left knee but recent has had more pain affecting her hands especially left thumb. The left thumb is particularly painful and sometimes unable to extend freely and uses her other hand. She has been using a cane on account of her knee arthritis in order to reduce pain while walking. She does not generally notice joint swelling, redness, or warmth.  Activities of Daily Living:  Patient reports morning stiffness for 24 hours.   Patient Reports nocturnal pain.  Difficulty dressing/grooming: Reports Difficulty climbing stairs: Reports Difficulty getting out of chair: Reports Difficulty using hands for taps, buttons, cutlery, and/or writing: Reports  Review of Systems  Constitutional: Negative for fatigue.  HENT: Negative for mouth sores, mouth dryness and nose dryness.   Eyes: Negative for pain, itching, visual disturbance and dryness.  Respiratory: Negative for cough, hemoptysis, shortness of breath and difficulty breathing.   Cardiovascular: Negative for chest pain, palpitations and swelling in legs/feet.  Gastrointestinal: Negative for abdominal pain, blood in stool, constipation and diarrhea.  Endocrine: Negative for increased urination.  Genitourinary: Negative for painful urination.  Musculoskeletal: Positive for arthralgias, joint pain, joint swelling, myalgias, muscle weakness, morning stiffness, muscle tenderness and myalgias.  Skin: Negative for  color change, rash and redness.  Allergic/Immunologic: Negative for susceptible to infections.  Neurological: Positive for numbness and weakness. Negative for dizziness, headaches and memory loss.  Hematological: Positive for bruising/bleeding tendency. Negative for swollen glands.  Psychiatric/Behavioral: Positive for sleep disturbance. Negative for confusion.    PMFS History:  Patient Active Problem List   Diagnosis Date Noted  . Positive ANA (antinuclear antibody) 12/13/2020  . Trigger finger of left thumb 12/10/2020  . Lumbar radiculopathy 11/07/2020  . Chronic right-sided low back pain with right-sided sciatica 10/10/2020  . Sleep disturbance 10/10/2020  . Myalgia 10/10/2020  . Super obese 10/10/2020  . Abnormality of gait 10/10/2020  . Statin intolerance 09/17/2020  . Recurrent boils 09/17/2020  . Chronic bilateral low back pain 05/16/2020  . Postoperative state 12/27/2019  . Iron deficiency 08/03/2019  . B12 deficiency 06/30/2019  . Chronic right shoulder pain 07/05/2018  . Trochanteric bursitis of left hip 07/05/2018  . Morbid obesity (HCC) 07/05/2018  . Body mass index 45.0-49.9, adult (HCC) 07/05/2018  . Marijuana user 05/26/2018  . Status post total right knee replacement 01/12/2018  . Status post total hip replacement, left 09/22/2017  . Hyperlipidemia 08/10/2017  . Mild depression (HCC) 06/25/2017  . Essential hypertension 06/25/2017  . Tobacco abuse 06/25/2017  . Prediabetes 06/25/2017  . OSA on CPAP 06/25/2017  . Bronchitis 09/24/2016  . Leukocytosis 09/24/2016  . Radiculopathy, lumbar region 07/16/2016  . Degenerative lumbar disc 07/03/2016  . Synovitis of hip 04/20/2016  . Screening for breast cancer 12/28/2014  . Pain in joint involving lower leg 05/13/2013    Past Medical History:  Diagnosis Date  . Anemia   . Anxiety   . Arthritis   . B12 deficiency 06/30/2019  . Depression   . GERD (gastroesophageal reflux disease)    occasionally takes OTC  .  Hyperlipidemia   . Hypertension   . Low back pain   . Obesity   . Pain in right buttock    Goes down to Right leg to lateral aspect of calf.  . Pre-diabetes   . Sleep apnea    tested 2014 - unable to tolerate machine  . Spondylolisthesis at L4-L5 level     Family History  Problem Relation Age of Onset  . Hypertension Mother   . Diabetes Mother   . Breast cancer Mother 12  . Hypertension Father   . Lung cancer Maternal Aunt   . Colon polyps Brother   . Colon cancer Neg Hx   . Esophageal cancer Neg Hx   . Rectal cancer Neg Hx   . Stomach cancer Neg Hx    Past Surgical History:  Procedure Laterality Date  . ABDOMINAL HYSTERECTOMY    . LEFT HEART CATH AND CORONARY ANGIOGRAPHY N/A 10/25/2018   Procedure: LEFT HEART CATH AND CORONARY ANGIOGRAPHY;  Surgeon: Swaziland, Peter M, MD;  Location: [facility];  Service: Cardiovascular;  Laterality: N/A;  . ROBOTIC ASSISTED LAPAROSCOPIC HYSTERECTOMY AND SALPINGECTOMY Bilateral 12/27/2019   Procedure: XI ROBOTIC ASSISTED TOTAL LAPAROSCOPIC HYSTERECTOMY AND SALPINGECTOMY;  Surgeon: Genia Del, MD;  Location: [facility];  Service: Gynecology;  Laterality: Bilateral;  . TOTAL HIP ARTHROPLASTY Left 09/22/2017   Procedure: LEFT TOTAL HIP ARTHROPLASTY ANTERIOR APPROACH;  Surgeon: Tarry Kos, MD;  Location: [facility];  Service: Orthopedics;  Laterality: Left;  . TOTAL KNEE ARTHROPLASTY Right 01/12/2018   Procedure: RIGHT TOTAL KNEE ARTHROPLASTY;  Surgeon: Tarry Kos, MD;  Location: [facility];  Service: Orthopedics;  Laterality: Right;  . TUBAL LIGATION     Social History   Social History Narrative  . Not on file   Immunization History  Administered Date(s) Administered  . Influenza,inj,Quad PF,6+ Mos 08/10/2017, 08/06/2019, 09/16/2020  . Influenza-Unspecified 08/24/2019  . PFIZER SARS-COV-2 Vaccination 02/18/2020, 03/11/2020  . Pneumococcal Polysaccharide-23 01/13/2018  . Tdap 09/16/2020     Objective: Vital  Signs: BP 126/86   Pulse 95   Ht 5\' 4"  (1.626 m)   Wt 289 lb (131.1 kg)   LMP  (LMP Unknown)   BMI 49.61 kg/m    Physical Exam Constitutional:      Appearance: She is obese.  HENT:     Right Ear: External ear normal.     Left Ear: External ear normal.     Mouth/Throat:     Mouth: Mucous membranes are moist.     Pharynx: Oropharynx is clear.  Eyes:     Conjunctiva/sclera: Conjunctivae normal.  Cardiovascular:     Rate and Rhythm: Normal rate and regular rhythm.  Pulmonary:     Effort: Pulmonary effort is normal.     Breath sounds: Normal breath sounds.  Skin:    General: Skin is warm and dry.     Findings: No rash.  Neurological:     General: No focal deficit present.     Mental Status: She is alert.     Musculoskeletal Exam:  Neck full range of motion no tenderness Shoulder ROM intact with tenderness to pressure Elbow, wrist, full range of motion no tenderness or swelling Left thumb flexor tendon nodule present with tenderness on volar aspect at [facility]  joint Bilateral low back paraspinal tenderness to palpation Normal hip internal and external rotation without pain, mild tenderness to lateral hip palpation on left Left knee slightly reduced flexion ROM and patellofemoral crepitus, right knee well healed surgical scar and normal ROM   Investigation: No additional findings.  Imaging: No results found.  Recent Labs: Lab Results  Component Value Date   WBC 16.0 (H) 12/13/2020   HGB 15.6 (H) 12/13/2020   PLT 409 (H) 12/13/2020   NA 134 (L) 12/13/2020   K 3.1 (L) 12/13/2020   CL 99 12/13/2020   CO2 23 12/13/2020   GLUCOSE 88 12/13/2020   BUN 7 12/13/2020   CREATININE 0.87 12/13/2020   BILITOT 0.3 09/16/2020   ALKPHOS 104 09/16/2020   AST 25 09/16/2020   ALT 30 09/16/2020   PROT 7.3 09/16/2020   ALBUMIN 4.4 09/16/2020   CALCIUM 8.7 (L) 12/13/2020   GFRAA >60 09/06/2020    Speciality Comments: No specialty comments available.  Procedures:  No procedures  performed Allergies: Pregabalin, Cymbalta [duloxetine hcl], Gabapentin, Lipitor [atorvastatin], Pravachol [pravastatin], and Zoloft [sertraline hcl]   Assessment / Plan:     Visit Diagnoses: Trigger finger of left thumb  Flexor tenosynovitis present with nodule seen with left thumb but not currently limiting use and not interested in steroid injection for treatment at this time. Discussed condition and to consider stretching and splinting in extension as initial conservative treatment. No evidence of inflammatory arthritis seen on exam and no clinical criteria of systemic lupus.  Trochanteric bursitis of left hip Lumbar radiculopathy  Back and lower extremity symptoms appear to be degenerative disease no effusions and inflammatory changes seen.  Positive ANA  She does not present clinical criteria of systemic lupus or related disease from history and on physcal exam. Based on this I do not recommend additional testing or rheumatologic treatment at this time.  Orders: No orders of the defined types were placed in this encounter.  No orders of the defined types were placed in this encounter.   Follow-Up Instructions: Return if symptoms worsen or fail to improve.   Collier Salina, MD  Note - This record has been created using [employer].  Chart creation errors have been sought, but may not always  have been located. Such creation errors do not reflect on  the standard of medical care.

## 2020-12-12 ENCOUNTER — Other Ambulatory Visit: Payer: Self-pay | Admitting: Internal Medicine

## 2020-12-12 DIAGNOSIS — I10 Essential (primary) hypertension: Secondary | ICD-10-CM

## 2020-12-13 ENCOUNTER — Encounter (HOSPITAL_COMMUNITY)
Admission: RE | Admit: 2020-12-13 | Discharge: 2020-12-13 | Disposition: A | Discharge status: Home / Self Care | Payer: Medicare Other | Source: Ambulatory Visit | Attending: Neurosurgery | Admitting: Neurosurgery

## 2020-12-13 ENCOUNTER — Other Ambulatory Visit: Payer: Self-pay

## 2020-12-13 ENCOUNTER — Encounter (HOSPITAL_COMMUNITY): Payer: Self-pay

## 2020-12-13 DIAGNOSIS — R768 Other specified abnormal immunological findings in serum: Secondary | ICD-10-CM | POA: Insufficient documentation

## 2020-12-13 DIAGNOSIS — Z01818 Encounter for other preprocedural examination: Secondary | ICD-10-CM | POA: Insufficient documentation

## 2020-12-13 HISTORY — DX: Personal history of diseases of the blood and blood-forming organs and certain disorders involving the immune mechanism: Z86.2

## 2020-12-13 LAB — TYPE AND SCREEN
ABO/RH(D): O POS
Antibody Screen: NEGATIVE

## 2020-12-13 LAB — BASIC METABOLIC PANEL
Anion gap: 12 (ref 5–15)
BUN: 7 mg/dL (ref 6–20)
CO2: 23 mmol/L (ref 22–32)
Calcium: 8.7 mg/dL — ABNORMAL LOW (ref 8.9–10.3)
Chloride: 99 mmol/L (ref 98–111)
Creatinine, Ser: 0.87 mg/dL (ref 0.44–1.00)
GFR, Estimated: >60 mL/min (ref >60)
Glucose, Bld: 88 mg/dL (ref 70–99)
Potassium: 3.1 mmol/L — ABNORMAL LOW (ref 3.5–5.1)
Sodium: 134 mmol/L — ABNORMAL LOW (ref 135–145)

## 2020-12-13 LAB — CBC
HCT: 46.8 % — ABNORMAL HIGH (ref 36.0–46.0)
Hemoglobin: 15.6 g/dL — ABNORMAL HIGH (ref 12.0–15.0)
MCH: 31 pg (ref 26.0–34.0)
MCHC: 33.3 g/dL (ref 30.0–36.0)
MCV: 93 fL (ref 80.0–100.0)
Platelets: 409 10*3/uL — ABNORMAL HIGH (ref 150–400)
RBC: 5.03 MIL/uL (ref 3.87–5.11)
RDW: 13.2 % (ref 11.5–15.5)
WBC: 16 10*3/uL — ABNORMAL HIGH (ref 4.0–10.5)
nRBC: 0 % (ref 0.0–0.2)

## 2020-12-13 LAB — GLUCOSE, CAPILLARY: Glucose-Capillary: 94 mg/dL (ref 70–99)

## 2020-12-13 LAB — SURGICAL PCR SCREEN
MRSA, PCR: NEGATIVE
Staphylococcus aureus: NEGATIVE

## 2020-12-13 MED ORDER — CHLORHEXIDINE GLUCONATE CLOTH 2 % EX PADS: 6.0000 | MEDICATED_PAD | Freq: Once | CUTANEOUS | Status: DC

## 2020-12-13 NOTE — Pre-Procedure Instructions (Incomplete)
PCP -  Cardiologist -   PPM/ICD -  Device Orders -  Rep Notified -   Chest x-ray -  EKG -  Stress Test -  ECHO -  Cardiac Cath -   Sleep Study -  CPAP -   Fasting Blood Sugar -  Checks Blood Sugar _____ times a day  Blood Thinner Instructions: Aspirin Instructions:  ERAS Protcol - PRE-SURGERY Ensure or G2-   COVID TEST-    Anesthesia review:   Patient denies shortness of breath, fever, cough and chest pain at PAT appointment   All instructions explained to the patient, with a verbal understanding of the material. Patient agrees to go over the instructions while at home for a better understanding. Patient also instructed to self quarantine after being tested for COVID-19. The opportunity to ask questions was provided.     Sarah Randall  12/13/2020      [id number] [name], [place] [address] Phone: [phone] Fax: [place], [address] [place] [address] Phone: [phone] Fax: [phone]    Your procedure is scheduled on ***.  Report to {OR ARRIVAL LOCATIONS:20459} at *** A.M.  Call this number if you have problems the morning of surgery:  {OR PAT NUMBER TO UVOZ:36644}   Remember:  Do not eat or drink after midnight.  You may drink clear liquids until *** .  Clear liquids allowed are:                    {Allowable Clear Liquids:21296}    Take these medicines the morning of surgery with A SIP OF WATER ***    Do not wear jewelry, make-up or nail polish.  Do not wear lotions, powders, or perfumes, or deodorant.  Do not shave 48 hours prior to surgery.  Men may shave face and neck.  Do not bring valuables to the hospital.  [facility] is not responsible for any belongings or valuables.  Contacts, dentures or bridgework may not be worn into surgery.  Leave your suitcase in the car.  After surgery it may be  brought to your room.  For patients admitted to the hospital, discharge time will be determined by your treatment team.  Patients discharged the day of surgery will not be allowed to drive home.   Name and phone number of your driver:   *** Special instructions:  ***  Please read over the following fact sheets that you were given. {OR PAT FACT SHEETS IHKVQ:25956}

## 2020-12-13 NOTE — Pre-Procedure Instructions (Addendum)
Sarah Randall  12/13/2020      [id number] [name], [place] [address] [place] [address] Phone: [phone] Fax: [place], [place] [address] [place] [address] Phone: [phone] Fax: [phone]    Your procedure is scheduled on 12/18/20.  Report to [facility] Admitting at 630 A.M.  Call this number if you have problems the morning of surgery:  [phone]   Remember:  Do not eafter mighnight     You may have clear liquids until 730am day of surgery.      Take these medicines the morning of surgery with A SIP OF WATER -----NEXIUM,ROBAXN,INDERAL    Do not wear jewelry, make-up or nail polish.  Do not wear lotions, powders, or perfumes, or deodorant.  Do not shave 48 hours prior to surgery.  Men may shave face and neck.  Do not bring valuables to the hospital.  [facility] is not responsible for any belongings or valuables.  Contacts, dentures or bridgework may not be worn into surgery.  Leave your suitcase in the car.  After surgery it may be brought to your room.  For patients admitted to the hospital, discharge time will be determined by your treatment team.  Patients discharged the day of surgery will not be allowed to drive home.   N  Do not take any aspirin,anti-inflammatories,vitamins,or herbal supplements 5-7 days prior to surgery. [facility] - Preparing for Surgery  Before surgery, you can play an important role.  Because skin is not sterile, your skin needs to be as free of germs as possible.  You can reduce the number of germs on you skin by washing with CHG (chlorahexidine gluconate) soap before surgery.  CHG is an antiseptic cleaner which kills germs and bonds with the skin to continue killing germs even after washing.  Oral Hygiene is also important in reducing the risk of infection.  Remember to brush your teeth with your  regular toothpaste the morning of surgery.  Please DO NOT use if you have an allergy to CHG or antibacterial soaps.  If your skin becomes reddened/irritated stop using the CHG and inform your nurse when you arrive at Short Stay.  Do not shave (including legs and underarms) for at least 48 hours prior to the first CHG shower.  You may shave your face.  Please follow these instructions carefully:   1.  Shower with CHG Soap the night before surgery and the morning of Surgery.  2.  If you choose to wash your hair, wash your hair first as usual with your normal shampoo.  3.  After you shampoo, rinse your hair and body thoroughly to remove the shampoo. 4.  Use CHG as you would any other liquid soap.  You can apply chg directly to the skin and wash gently with a      scrungie or washcloth.           5.  Apply the CHG Soap to your body ONLY FROM THE NECK DOWN.   Do not use on open wounds or open sores. Avoid contact with your eyes, ears, mouth and genitals (private parts).  Wash genitals (private parts) with your normal soap.  6.  Wash thoroughly, paying special attention to the area where your surgery will be performed.  7.  Thoroughly rinse your body with warm water from the neck down.  8.  DO NOT shower/wash with your normal soap after using and rinsing off the CHG Soap.  9.  Pat yourself dry with a clean towel.            10.  Wear clean pajamas.            11.  Place clean sheets on your bed the night of your first shower and do not sleep with pets.  Day of Surgery  Do not apply any lotions/deoderants the morning of surgery.   Please wear clean clothes to the hospital/surgery center. Remember to brush your teeth with toothpaste.  Please read over the following fact sheets that you were given. Coughing and Deep Breathing

## 2020-12-16 ENCOUNTER — Other Ambulatory Visit (HOSPITAL_COMMUNITY)
Admission: RE | Admit: 2020-12-16 | Discharge: 2020-12-16 | Disposition: A | Discharge status: Home / Self Care | Payer: Medicare Other | Source: Ambulatory Visit | Attending: Neurosurgery | Admitting: Neurosurgery

## 2020-12-16 ENCOUNTER — Encounter (HOSPITAL_COMMUNITY): Payer: Self-pay

## 2020-12-16 DIAGNOSIS — Z20822 Contact with and (suspected) exposure to covid-19: Secondary | ICD-10-CM | POA: Insufficient documentation

## 2020-12-16 DIAGNOSIS — Z01812 Encounter for preprocedural laboratory examination: Secondary | ICD-10-CM | POA: Diagnosis not present

## 2020-12-16 LAB — SARS CORONAVIRUS 2 (TAT 6-24 HRS): SARS Coronavirus 2: NEGATIVE

## 2020-12-16 NOTE — Anesthesia Preprocedure Evaluation (Addendum)
Anesthesia Evaluation  Patient identified by MRN, date of birth, ID band Patient awake    Reviewed: Allergy & Precautions, NPO status , Patient's Chart, lab work & pertinent test results  History of Anesthesia Complications Negative for: history of anesthetic complications  Airway Mallampati: II  TM Distance: >3 FB Neck ROM: Full    Dental  (+) Teeth Intact, Dental Advisory Given   Pulmonary sleep apnea , Current Smoker and Patient abstained from smoking.,    Pulmonary exam normal breath sounds clear to auscultation       Cardiovascular hypertension, Pt. on home beta blockers and Pt. on medications (-) angina(-) Past MI Normal cardiovascular exam Rhythm:Regular Rate:Normal  LHC 10/25/18: 1. No significant CAD 2. Normal LV function 3. Normal LVEDP    Neuro/Psych PSYCHIATRIC DISORDERS Anxiety Depression  Neuromuscular disease    GI/Hepatic Neg liver ROS, GERD  Medicated,  Endo/Other  Morbid obesity  Renal/GU negative Renal ROS     Musculoskeletal  (+) Arthritis ,   Abdominal   Peds  Hematology negative hematology ROS (+)   Anesthesia Other Findings   Reproductive/Obstetrics                           Anesthesia Physical Anesthesia Plan  ASA: III  Anesthesia Plan: General   Post-op Pain Management:    Induction: Intravenous  PONV Risk Score and Plan: 3 and Midazolam, Propofol infusion, Dexamethasone and Ondansetron  Airway Management Planned: Oral ETT  Additional Equipment: Arterial line  Intra-op Plan:   Post-operative Plan: Possible Post-op intubation/ventilation  Informed Consent: I have reviewed the patients History and Physical, chart, labs and discussed the procedure including the risks, benefits and alternatives for the proposed anesthesia with the patient or authorized representative who has indicated his/her understanding and acceptance.     Dental advisory  given  Plan Discussed with: CRNA  Anesthesia Plan Comments: (PAT note written by Myra Gianotti, PA-C.  2 large bore PIV!)     Anesthesia Quick Evaluation

## 2020-12-16 NOTE — Progress Notes (Signed)
Anesthesia Chart Review:  Case: [id number] Date/Time: 12/18/20 0815   Procedures:      OLIF L4-L5 (N/A )     LUMBAR PERCUTANEOUS PEDICLE SCREW 1 LEVEL (N/A )     ABDOMINAL EXPOSURE (N/A )   Anesthesia type: General   Pre-op diagnosis: SPONDYLOLISTHESIS AT LUMBAR 4- LUMBAR 5 LEVEL   Location: [facility] OR ROOM 21 / [facility]   Surgeons: Vallarie Mare, MD; Marty Heck, MD      DISCUSSION: Patient is a 55 year old female scheduled for the above procedure.  History includes smoking, HTN, OSA (intolerant to CPAP), GERD, pre-diabetes, anemia, HLD, back pain, anxiety, depression, leukocytosis (hematology evaluation 2020, likely reactive), uterine fibroids (s/p hysterectomy/bilateral salpingectomy 12/27/19), THA (left, 09/22/17). No significant CAD by 10/25/18 LHC. BMI is consistent with morbid obesity.  Known leukocytosis with last hematology visit with Dr. Burr Medico in 11/2019 and felt likely related to smoking. Since 11/2018, WBC range 12.2-22.7 (post hysterectomy) but has primarily been in the 13-15K range by [facility] labs (median ~ 14.5, mean 15.07) . K 3.1. Potassium and WBC results/trends called to [facility] at Dr. Marcello Moores' office. Currently, will defer decision for additional recommendations, if any, to surgeon(s).   [place] COVID-19 vaccine 03/11/20. 12/17/19 presurgical COVID-19 test in process. Anesthesia team to evaluate on the day of surgery.    VS: BP (!) 123/99   Pulse 89   Temp 36.4 C (Oral)   Resp 19   Ht 5\' 4"  (1.626 m)   Wt 132.1 kg   LMP  (LMP Unknown)   SpO2 100%   BMI 49.98 kg/m   BP Readings from Last 3 Encounters:  12/13/20 (!) 123/99  12/10/20 126/86  11/26/20 110/84    PROVIDERS: Ladell Pier, MD is PCP ([place]). Last visit 09/16/20. HCTZ increased for HTN. Awaiting decision regarding back surgery at that time.  Truitt Merle, MD is HEM (previously saw, Zoila Shutter, MD). Last visit 11/24/19 for leukocytosis and B12 deficiency  follow-up. Previous negative SPEP. At that time, mild leukocytosis felt likely reactive, related to smoking. Smoking cessation advised with six month lab/follow-up.  Vernelle Emerald, MD is rheumatologist. See 12/10/20 for left thumb trigger finger. + ANA. He wrote, "She does not present clinical criteria of systemic lupus or related disease from history and on physcal exam. Based on this I do not recommend additional testing or rheumatologic treatment at this time." - She is not followed routinely by cardiology, but had evaluation by Maude Leriche, MD in 10/2018 followed by [facility] showing minimal CAD (lumininal irregularities in LCx, RCA).    LABS: Preoperative labs noted. See DISCUSSION. A1c 6.2% 09/16/20.  (all labs ordered are listed, but only abnormal results are displayed)  Labs Reviewed  CBC - Abnormal; Notable for the following components:      Result Value   WBC 16.0 (*)    Hemoglobin 15.6 (*)    HCT 46.8 (*)    Platelets 409 (*)    All other components within normal limits  BASIC METABOLIC PANEL - Abnormal; Notable for the following components:   Sodium 134 (*)    Potassium 3.1 (*)    Calcium 8.7 (*)    All other components within normal limits  SURGICAL PCR SCREEN  GLUCOSE, CAPILLARY  TYPE AND SCREEN     IMAGES: CXR 09/06/20: FINDINGS: Shallow inspiration. Normal heart size and pulmonary vascularity. No focal airspace disease or consolidation in the lungs. No blunting of costophrenic  angles. No pneumothorax. Mediastinal contours appear intact. IMPRESSION: No active cardiopulmonary disease.  MRI L-spine 12/21/19: IMPRESSION: 1. Intermittent lower thoracic and lumbar disc, endplate, and posterior element degeneration. Mild degenerative endplate marrow edema at T11-T12 and L4-L5. 2. Increased multifactorial mild to moderate spinal and/or lateral recess stenosis since the 2017 MRI at L3-L4 and L4-L5. 3. Stable up to moderate moderate left L4 and L5 neural  foraminal stenosis. 4. Mild lower thoracic spinal stenosis at T11-T12. Mild if any cord mass effect and no cord signal abnormality.   EKG: 12/13/20: NSR   CV: Cardiac cath 10/25/18:  The left ventricular systolic function is normal.  LV end diastolic pressure is normal.  The left ventricular ejection fraction is 55-65% by visual estimate. 1. No significant CAD (minimal luminal irregularities in LCx, RCA) 2. Normal LV function 3. Normal LVEDP   Nuclear stress test 09/24/16 ([facility] Care Everywhere; done during overnight admission for chest pain at [facility]): Result Impression: 1. No reversible ischemia or infarction. 2. Normal left ventricular wall motion. 3. Left ventricular ejection fraction 62%. 4. Non invasive risk stratification*: Low   Past Medical History:  Diagnosis Date  . Anemia   . Anxiety   . Arthritis   . B12 deficiency 06/30/2019  . Depression   . GERD (gastroesophageal reflux disease)    occasionally takes OTC  . History of leukocytosis   . Hyperlipidemia   . Hypertension   . Low back pain   . Obesity   . Pain in right buttock    Goes down to Right leg to lateral aspect of calf.  . Pre-diabetes   . Sleep apnea    tested 2014 - unable to tolerate machine  . Spondylolisthesis at L4-L5 level     Past Surgical History:  Procedure Laterality Date  . ABDOMINAL HYSTERECTOMY    . LEFT HEART CATH AND CORONARY ANGIOGRAPHY N/A 10/25/2018   Procedure: LEFT HEART CATH AND CORONARY ANGIOGRAPHY;  Surgeon: Martinique, Peter M, MD;  Location: [facility];  Service: Cardiovascular;  Laterality: N/A;  . ROBOTIC ASSISTED LAPAROSCOPIC HYSTERECTOMY AND SALPINGECTOMY Bilateral 12/27/2019   Procedure: XI ROBOTIC ASSISTED TOTAL LAPAROSCOPIC HYSTERECTOMY AND SALPINGECTOMY;  Surgeon: Princess Bruins, MD;  Location: [facility];  Service: Gynecology;  Laterality: Bilateral;  . TOTAL HIP ARTHROPLASTY Left 09/22/2017   Procedure: LEFT TOTAL  HIP ARTHROPLASTY ANTERIOR APPROACH;  Surgeon: Leandrew Koyanagi, MD;  Location: [facility];  Service: Orthopedics;  Laterality: Left;  . TOTAL KNEE ARTHROPLASTY Right 01/12/2018   Procedure: RIGHT TOTAL KNEE ARTHROPLASTY;  Surgeon: Leandrew Koyanagi, MD;  Location: [facility];  Service: Orthopedics;  Laterality: Right;  . TUBAL LIGATION      MEDICATIONS: . amitriptyline (ELAVIL) 25 MG tablet  . DULoxetine (CYMBALTA) 30 MG capsule  . esomeprazole (NEXIUM) 40 MG capsule  . hydrochlorothiazide (HYDRODIURIL) 25 MG tablet  . losartan (COZAAR) 50 MG tablet  . methocarbamol (ROBAXIN) 500 MG tablet  . nitroGLYCERIN (NITROSTAT) 0.4 MG SL tablet  . propranolol (INDERAL) 10 MG tablet   No current facility-administered medications for this encounter.    Myra Gianotti, PA-C Surgical Short Stay/Anesthesiology [facility] Phone [phone] [facility] Phone [phone] 12/16/2020 4:33 PM

## 2020-12-21 ENCOUNTER — Other Ambulatory Visit: Payer: Self-pay | Admitting: Internal Medicine

## 2020-12-21 DIAGNOSIS — K219 Gastro-esophageal reflux disease without esophagitis: Secondary | ICD-10-CM

## 2021-01-02 ENCOUNTER — Ambulatory Visit: Payer: Medicare Other | Admitting: Internal Medicine

## 2021-01-16 ENCOUNTER — Encounter: Payer: Medicare Other | Admitting: Physical Medicine & Rehabilitation

## 2021-01-17 ENCOUNTER — Encounter (HOSPITAL_COMMUNITY): Payer: Self-pay

## 2021-01-17 ENCOUNTER — Encounter (HOSPITAL_COMMUNITY)
Admission: RE | Admit: 2021-01-17 | Discharge: 2021-01-17 | Disposition: A | Discharge status: Home / Self Care | Payer: Medicare Other | Source: Ambulatory Visit | Attending: Neurosurgery | Admitting: Neurosurgery

## 2021-01-17 ENCOUNTER — Other Ambulatory Visit: Payer: Self-pay

## 2021-01-17 DIAGNOSIS — K219 Gastro-esophageal reflux disease without esophagitis: Secondary | ICD-10-CM | POA: Insufficient documentation

## 2021-01-17 DIAGNOSIS — G4733 Obstructive sleep apnea (adult) (pediatric): Secondary | ICD-10-CM | POA: Insufficient documentation

## 2021-01-17 DIAGNOSIS — I10 Essential (primary) hypertension: Secondary | ICD-10-CM | POA: Diagnosis not present

## 2021-01-17 DIAGNOSIS — M4316 Spondylolisthesis, lumbar region: Secondary | ICD-10-CM | POA: Diagnosis not present

## 2021-01-17 DIAGNOSIS — M4804 Spinal stenosis, thoracic region: Secondary | ICD-10-CM | POA: Diagnosis not present

## 2021-01-17 DIAGNOSIS — F419 Anxiety disorder, unspecified: Secondary | ICD-10-CM | POA: Insufficient documentation

## 2021-01-17 DIAGNOSIS — Z01812 Encounter for preprocedural laboratory examination: Secondary | ICD-10-CM | POA: Diagnosis not present

## 2021-01-17 DIAGNOSIS — Z79899 Other long term (current) drug therapy: Secondary | ICD-10-CM | POA: Diagnosis not present

## 2021-01-17 DIAGNOSIS — M48061 Spinal stenosis, lumbar region without neurogenic claudication: Secondary | ICD-10-CM | POA: Insufficient documentation

## 2021-01-17 DIAGNOSIS — Z6841 Body Mass Index (BMI) 40.0 and over, adult: Secondary | ICD-10-CM | POA: Insufficient documentation

## 2021-01-17 DIAGNOSIS — Z7901 Long term (current) use of anticoagulants: Secondary | ICD-10-CM | POA: Insufficient documentation

## 2021-01-17 DIAGNOSIS — F32A Depression, unspecified: Secondary | ICD-10-CM | POA: Insufficient documentation

## 2021-01-17 DIAGNOSIS — F17210 Nicotine dependence, cigarettes, uncomplicated: Secondary | ICD-10-CM | POA: Insufficient documentation

## 2021-01-17 LAB — CBC
HCT: 43.5 % (ref 36.0–46.0)
Hemoglobin: 14.7 g/dL (ref 12.0–15.0)
MCH: 31.3 pg (ref 26.0–34.0)
MCHC: 33.8 g/dL (ref 30.0–36.0)
MCV: 92.8 fL (ref 80.0–100.0)
Platelets: 452 10*3/uL — ABNORMAL HIGH (ref 150–400)
RBC: 4.69 MIL/uL (ref 3.87–5.11)
RDW: 13.4 % (ref 11.5–15.5)
WBC: 15.3 10*3/uL — ABNORMAL HIGH (ref 4.0–10.5)
nRBC: 0 % (ref 0.0–0.2)

## 2021-01-17 LAB — BASIC METABOLIC PANEL
Anion gap: 10 (ref 5–15)
BUN: 12 mg/dL (ref 6–20)
CO2: 26 mmol/L (ref 22–32)
Calcium: 9 mg/dL (ref 8.9–10.3)
Chloride: 99 mmol/L (ref 98–111)
Creatinine, Ser: 0.97 mg/dL (ref 0.44–1.00)
GFR, Estimated: >60 mL/min (ref >60)
Glucose, Bld: 100 mg/dL — ABNORMAL HIGH (ref 70–99)
Potassium: 3.2 mmol/L — ABNORMAL LOW (ref 3.5–5.1)
Sodium: 135 mmol/L (ref 135–145)

## 2021-01-17 LAB — SURGICAL PCR SCREEN
MRSA, PCR: NEGATIVE
Staphylococcus aureus: NEGATIVE

## 2021-01-17 LAB — GLUCOSE, CAPILLARY: Glucose-Capillary: 87 mg/dL (ref 70–99)

## 2021-01-17 NOTE — Pre-Procedure Instructions (Signed)
Sarah Randall  01/17/2021    Your procedure is scheduled on Wednesday, January 22, 2021 at 8:30 AM.   Report to [facility] Center Entrance "A" Admitting Office at 6:30 AM.   Call this number if you have problems the morning of surgery: [phone]   Questions prior to day of surgery, please call [phone] between 8 & 4 PM.   Remember:  Do not eat or drink after midnight Tuesday, 01/21/21.  Take these medicines the morning of surgery with A SIP OF WATER: Esomeprazole (Nexium), Propanolol (Inderal), Methocarbamol (Robaxin) - if needed, Nitroglycerin - if needed  Do not use NSAIDS (Ibuprofen, Aleve, etc), Aspirin containing products, Multivitamins, Herbal medications or Fish Oil prior to surgery.  Do NOT smoke 24 hours prior to surgery (cigarettes and marijuana).     Do not wear jewelry, make-up or nail polish.  Do not wear lotions, powders, perfumes or deodorant.  Do not shave 48 hours prior to surgery.    Do not bring valuables to the hospital.  [facility] is not responsible for any belongings or valuables.  Contacts, dentures or bridgework may not be worn into surgery.  Leave your suitcase in the car.  After surgery it may be brought to your room.  For patients admitted to the hospital, discharge time will be determined by your treatment team.  [facility] - Preparing for Surgery  Before surgery, you can play an important role.  Because skin is not sterile, your skin needs to be as free of germs as possible.  You can reduce the number of germs on you skin by washing with CHG (chlorahexidine gluconate) soap before surgery.  CHG is an antiseptic cleaner which kills germs and bonds with the skin to continue killing germs even after washing.  Oral Hygiene is also important in reducing the risk of infection.  Remember to brush your teeth with your regular toothpaste the morning of surgery.  Please DO NOT use if you have an allergy to CHG or antibacterial soaps.  If your skin  becomes reddened/irritated stop using the CHG and inform your nurse when you arrive at Short Stay.  Do not shave (including legs and underarms) for at least 48 hours prior to the first CHG shower.  You may shave your face.  Please follow these instructions carefully:   1.  Shower with CHG Soap the night before surgery and the morning of Surgery.  2.  If you choose to wash your hair, wash your hair first as usual with your normal shampoo.  3.  After you shampoo, rinse your hair and body thoroughly to remove the shampoo. 4.  Use CHG as you would any other liquid soap.  You can apply chg directly to the skin and wash gently with a      scrungie or washcloth.           5.  Apply the CHG Soap to your body ONLY FROM THE NECK DOWN.   Do not use on open wounds or open sores. Avoid contact with your eyes, ears, mouth and genitals (private parts).  Wash genitals (private parts) with your normal soap - do this prior to using CHG soap.  6.  Wash thoroughly, paying special attention to the area where your surgery will be performed.  7.  Thoroughly rinse your body with warm water from the neck down.  8.  DO NOT shower/wash with your normal soap after using and rinsing off the CHG Soap.  9.  Pat yourself  dry with a clean towel.            10.  Wear clean pajamas.            11.  Place clean sheets on your bed the night of your first shower and do not sleep with pets.  Day of Surgery  Shower as above. Do not apply any lotions/deodorants the morning of surgery.   Please wear clean clothes to the hospital. Remember to brush your teeth with toothpaste.  Please read over the fact sheets that you were given.

## 2021-01-17 NOTE — Progress Notes (Addendum)
PCP - Dr. Etheleen Mayhew  Cardiologist - Denies  Chest x-ray - 09/06/20 (E)  EKG - 12/13/20 (E)  Stress Test - Denies  ECHO - Denies  Cardiac Cath - 10/25/18 (E)  AICD-na PM-na LOOP-na  Sleep Study - Yes- Positive CPAP - Denies  LABS- 01/17/21: CBC, BMP, T/S 01/20/21: COVID  ASA- Denies  ERAS- No  HA1C- 09/16/20- 6.2 (E) Blood Sugar- 87 Prediabetic and she does not check her bs.  Anesthesia- Yes- previous consult  Pt denies having chest pain, sob, or fever at this time. All instructions explained to the pt, with a verbal understanding of the material. Pt agrees to go over the instructions while at home for a better understanding. Pt also instructed to self quarantine after being tested for COVID-19. The opportunity to ask questions was provided.   Coronavirus Screening  Have you experienced the following symptoms:  Cough yes/no: No Fever (>100.58F)  yes/no: No Runny nose yes/no: No Sore throat yes/no: No Difficulty breathing/shortness of breath  yes/no: No  Have you or a family member traveled in the last 14 days and where? yes/no: No   If the patient indicates "YES" to the above questions, their PAT will be rescheduled to limit the exposure to others and, the surgeon will be notified. THE PATIENT WILL NEED TO BE ASYMPTOMATIC FOR 14 DAYS.   If the patient is not experiencing any of these symptoms, the PAT nurse will instruct them to NOT bring anyone with them to their appointment since they may have these symptoms or traveled as well.   Please remind your patients and families that hospital visitation restrictions are in effect and the importance of the restrictions.

## 2021-01-20 ENCOUNTER — Other Ambulatory Visit (HOSPITAL_COMMUNITY)
Admission: RE | Admit: 2021-01-20 | Discharge: 2021-01-20 | Disposition: A | Discharge status: Home / Self Care | Payer: Medicare Other | Source: Ambulatory Visit | Attending: Neurosurgery | Admitting: Neurosurgery

## 2021-01-20 ENCOUNTER — Other Ambulatory Visit: Payer: Self-pay | Admitting: Neurosurgery

## 2021-01-20 DIAGNOSIS — Z833 Family history of diabetes mellitus: Secondary | ICD-10-CM | POA: Diagnosis not present

## 2021-01-20 DIAGNOSIS — F17210 Nicotine dependence, cigarettes, uncomplicated: Secondary | ICD-10-CM | POA: Diagnosis not present

## 2021-01-20 DIAGNOSIS — M4316 Spondylolisthesis, lumbar region: Secondary | ICD-10-CM | POA: Diagnosis not present

## 2021-01-20 DIAGNOSIS — F32A Depression, unspecified: Secondary | ICD-10-CM | POA: Diagnosis not present

## 2021-01-20 DIAGNOSIS — M5116 Intervertebral disc disorders with radiculopathy, lumbar region: Secondary | ICD-10-CM | POA: Diagnosis not present

## 2021-01-20 DIAGNOSIS — Z96653 Presence of artificial knee joint, bilateral: Secondary | ICD-10-CM | POA: Diagnosis not present

## 2021-01-20 DIAGNOSIS — M48061 Spinal stenosis, lumbar region without neurogenic claudication: Secondary | ICD-10-CM | POA: Diagnosis not present

## 2021-01-20 DIAGNOSIS — Z560 Unemployment, unspecified: Secondary | ICD-10-CM | POA: Diagnosis not present

## 2021-01-20 DIAGNOSIS — Z803 Family history of malignant neoplasm of breast: Secondary | ICD-10-CM | POA: Diagnosis not present

## 2021-01-20 DIAGNOSIS — Z79899 Other long term (current) drug therapy: Secondary | ICD-10-CM | POA: Diagnosis not present

## 2021-01-20 DIAGNOSIS — Z801 Family history of malignant neoplasm of trachea, bronchus and lung: Secondary | ICD-10-CM | POA: Diagnosis not present

## 2021-01-20 DIAGNOSIS — Z20822 Contact with and (suspected) exposure to covid-19: Secondary | ICD-10-CM | POA: Diagnosis not present

## 2021-01-20 DIAGNOSIS — Z9889 Other specified postprocedural states: Secondary | ICD-10-CM | POA: Diagnosis not present

## 2021-01-20 DIAGNOSIS — M5416 Radiculopathy, lumbar region: Secondary | ICD-10-CM | POA: Diagnosis not present

## 2021-01-20 DIAGNOSIS — E785 Hyperlipidemia, unspecified: Secondary | ICD-10-CM | POA: Diagnosis not present

## 2021-01-20 DIAGNOSIS — Z01812 Encounter for preprocedural laboratory examination: Secondary | ICD-10-CM | POA: Insufficient documentation

## 2021-01-20 DIAGNOSIS — K219 Gastro-esophageal reflux disease without esophagitis: Secondary | ICD-10-CM | POA: Diagnosis not present

## 2021-01-20 DIAGNOSIS — Z9989 Dependence on other enabling machines and devices: Secondary | ICD-10-CM | POA: Diagnosis not present

## 2021-01-20 DIAGNOSIS — G8929 Other chronic pain: Secondary | ICD-10-CM | POA: Diagnosis not present

## 2021-01-20 DIAGNOSIS — Z8371 Family history of colonic polyps: Secondary | ICD-10-CM | POA: Diagnosis not present

## 2021-01-20 DIAGNOSIS — Z888 Allergy status to other drugs, medicaments and biological substances status: Secondary | ICD-10-CM | POA: Diagnosis not present

## 2021-01-20 DIAGNOSIS — G4733 Obstructive sleep apnea (adult) (pediatric): Secondary | ICD-10-CM | POA: Diagnosis not present

## 2021-01-20 DIAGNOSIS — I10 Essential (primary) hypertension: Secondary | ICD-10-CM | POA: Diagnosis not present

## 2021-01-20 DIAGNOSIS — Z8249 Family history of ischemic heart disease and other diseases of the circulatory system: Secondary | ICD-10-CM | POA: Diagnosis not present

## 2021-01-20 LAB — SARS CORONAVIRUS 2 (TAT 6-24 HRS): SARS Coronavirus 2: NEGATIVE

## 2021-01-20 NOTE — Progress Notes (Signed)
Anesthesia Chart Review:  Case: [id number] Date/Time: 01/22/21 0815   Procedures:      OLIF L4-L5 (N/A )     LUMBAR PERCUTANEOUS PEDICLE SCREW 1 LEVEL (N/A )     ABDOMINAL EXPOSURE (N/A )   Anesthesia type: General   Pre-op diagnosis: SPONDYLOLISTHESIS AT LUMBAR 4- LUMBAR 5 LEVEL   Location: [place] OR ROOM 19 / [place] [facility]   Surgeons: Vallarie Mare, MD; Marty Heck, MD      DISCUSSION: Patient is a 55 year old female scheduled for the above procedure. Surgery was initially scheduled for 12/18/20 but rescheduled for 01/22/21, possible due to surgery delays related to increased COVID hospitalizations.  History includes smoking, HTN, OSA (intolerant to CPAP), GERD, pre-diabetes, anemia, HLD, back pain, anxiety, depression, leukocytosis (hematology evaluation 2020, likely reactive), uterine fibroids (s/p hysterectomy/bilateral salpingectomy 12/27/19), THA (left, 09/22/17). No significant CAD by 10/25/18 LHC. BMI is consistent with morbid obesity.  WBC 15.3K. Known leukocytosis with last hematology visit with Dr. Burr Medico in 11/2019 and felt likely related to smoking. Since 11/2018, WBC range 12.2-22.7 (post hysterectomy) but has primarily been in the 13-15K range by [facility] labs (median ~ 14.5, mean 15.07) . K 3.2. Potassium and WBC results/trends called to [facility] at Dr. Marcello Moores' office. Currently, will defer decision for additional recommendations, if any, to surgeon(s).   [place] COVID-19 vaccine 03/11/20. 01/20/21 presurgical COVID-19 test is pending. Anesthesia team to evaluate on the day of surgery.    VS: BP 118/89   Pulse 90   Temp 36.7 C (Oral)   Resp 19   Ht 5\' 4"  (1.626 m)   Wt 130.5 kg   LMP  (LMP Unknown)   SpO2 100%   BMI 49.37 kg/m    PROVIDERS: Ladell Pier, MD is PCP ([place]). Last visit 09/16/20. HCTZ increased for HTN. Awaiting decision regarding back surgery at that time.  Truitt Merle, MD is HEM (previously saw, Zoila Shutter,  MD). Last visit 11/24/19 for leukocytosis and B12 deficiency follow-up. Previous negative SPEP. At that time, mild leukocytosis felt likely reactive, related to smoking. Smoking cessation advised with six month lab/follow-up.  Vernelle Emerald, MD is rheumatologist. See 12/10/20 for left thumb trigger finger. + ANA. He wrote, "She does not present clinical criteria of systemic lupus or related disease from history and on physcal exam. Based on this I do not recommend additional testing or rheumatologic treatment at this time." - She is not followed routinely by cardiology, but had evaluation by Maude Leriche, MD in 10/2018 followed by [place] showing minimal CAD (lumininal irregularities in LCx, RCA).    LABS: Preoperative labs noted. See DISCUSSION. A1c 6.2% 09/16/20. (all labs ordered are listed, but only abnormal results are displayed)  Labs Reviewed  BASIC METABOLIC PANEL - Abnormal; Notable for the following components:      Result Value   Potassium 3.2 (*)    Glucose, Bld 100 (*)    All other components within normal limits  CBC - Abnormal; Notable for the following components:   WBC 15.3 (*)    Platelets 452 (*)    All other components within normal limits  SURGICAL PCR SCREEN  GLUCOSE, CAPILLARY  TYPE AND SCREEN     IMAGES: CXR 09/06/20: FINDINGS: Shallow inspiration. Normal heart size and pulmonary vascularity. No focal airspace disease or consolidation in the lungs. No blunting of costophrenic angles. No pneumothorax. Mediastinal contours appear intact. IMPRESSION: No active cardiopulmonary disease.  MRI  L-spine 12/21/19: IMPRESSION: 1. Intermittent lower thoracic and lumbar disc, endplate, and posterior element degeneration. Mild degenerative endplate marrow edema at T11-T12 and L4-L5. 2. Increased multifactorial mild to moderate spinal and/or lateral recess stenosis since the 2017 MRI at L3-L4 and L4-L5. 3. Stable up to moderate moderate left L4 and L5 neural  foraminal stenosis. 4. Mild lower thoracic spinal stenosis at T11-T12. Mild if any cord mass effect and no cord signal abnormality.   EKG: 12/13/20: NSR   CV: Cardiac cath 10/25/18:  The left ventricular systolic function is normal.  LV end diastolic pressure is normal.  The left ventricular ejection fraction is 55-65% by visual estimate. 1. No significant CAD (minimal luminal irregularities in LCx, RCA) 2. Normal LV function 3. Normal LVEDP   Nuclear stress test 09/24/16 ([place]; Care Everywhere; done during overnight admission for chest pain at [facility]): Result Impression: 1. No reversible ischemia or infarction. 2. Normal left ventricular wall motion. 3. Left ventricular ejection fraction 62%. 4. Non invasive risk stratification*: Low      Past Medical History:  Diagnosis Date  . Anemia   . Anxiety   . Arthritis   . B12 deficiency 06/30/2019  . Depression   . GERD (gastroesophageal reflux disease)    occasionally takes OTC  . History of leukocytosis   . Hyperlipidemia   . Hypertension   . Low back pain   . Obesity   . Pain in right buttock    Goes down to Right leg to lateral aspect of calf.  . Pre-diabetes   . Sleep apnea    tested 2014 - unable to tolerate machine  . Spondylolisthesis at L4-L5 level     Past Surgical History:  Procedure Laterality Date  . ABDOMINAL HYSTERECTOMY    . LEFT HEART CATH AND CORONARY ANGIOGRAPHY N/A 10/25/2018   Procedure: LEFT HEART CATH AND CORONARY ANGIOGRAPHY;  Surgeon: Martinique, Peter M, MD;  Location: [place] CV LAB;  Service: Cardiovascular;  Laterality: N/A;  . ROBOTIC ASSISTED LAPAROSCOPIC HYSTERECTOMY AND SALPINGECTOMY Bilateral 12/27/2019   Procedure: XI ROBOTIC ASSISTED TOTAL LAPAROSCOPIC HYSTERECTOMY AND SALPINGECTOMY;  Surgeon: Princess Bruins, MD;  Location: [place];  Service: Gynecology;  Laterality: Bilateral;  . TOTAL HIP ARTHROPLASTY Left 09/22/2017   Procedure:  LEFT TOTAL HIP ARTHROPLASTY ANTERIOR APPROACH;  Surgeon: Leandrew Koyanagi, MD;  Location: [place];  Service: Orthopedics;  Laterality: Left;  . TOTAL KNEE ARTHROPLASTY Right 01/12/2018   Procedure: RIGHT TOTAL KNEE ARTHROPLASTY;  Surgeon: Leandrew Koyanagi, MD;  Location: [place];  Service: Orthopedics;  Laterality: Right;  . TUBAL LIGATION      MEDICATIONS: . amitriptyline (ELAVIL) 25 MG tablet  . DULoxetine (CYMBALTA) 30 MG capsule  . esomeprazole (NEXIUM) 40 MG capsule  . hydrochlorothiazide (HYDRODIURIL) 25 MG tablet  . losartan (COZAAR) 50 MG tablet  . methocarbamol (ROBAXIN) 500 MG tablet  . nitroGLYCERIN (NITROSTAT) 0.4 MG SL tablet  . propranolol (INDERAL) 10 MG tablet   No current facility-administered medications for this encounter.  Not currently taking Cymbalta.    Myra Gianotti, PA-C Surgical Short Stay/Anesthesiology [facility] Phone [phone] [facility] Phone [phone] 01/20/2021 10:21 AM

## 2021-01-21 ENCOUNTER — Other Ambulatory Visit: Payer: Self-pay | Admitting: Obstetrics & Gynecology

## 2021-01-21 MED ORDER — DEXTROSE 5 % IV SOLN
3.0000 g | INTRAVENOUS | Status: AC
  Administered 2021-01-22 (×2): 3 g via INTRAVENOUS
  Filled 2021-01-21: qty 3

## 2021-01-22 ENCOUNTER — Inpatient Hospital Stay (HOSPITAL_COMMUNITY): Payer: Medicare Other

## 2021-01-22 ENCOUNTER — Inpatient Hospital Stay (HOSPITAL_COMMUNITY): Payer: Medicare Other | Admitting: Anesthesiology

## 2021-01-22 ENCOUNTER — Inpatient Hospital Stay (HOSPITAL_COMMUNITY): Payer: Medicare Other | Admitting: Vascular Surgery

## 2021-01-22 ENCOUNTER — Inpatient Hospital Stay (HOSPITAL_COMMUNITY)
Admission: RE | Admit: 2021-01-22 | Discharge: 2021-01-23 | DRG: 460 | Disposition: A | Discharge status: Home / Self Care | Payer: Medicare Other | Attending: Neurosurgery | Admitting: Neurosurgery

## 2021-01-22 ENCOUNTER — Encounter (HOSPITAL_COMMUNITY): Payer: Self-pay

## 2021-01-22 ENCOUNTER — Other Ambulatory Visit: Payer: Self-pay

## 2021-01-22 ENCOUNTER — Encounter (HOSPITAL_COMMUNITY)
Admission: RE | Disposition: A | Discharge status: Home / Self Care | Payer: Self-pay | Source: Home / Self Care | Attending: Neurosurgery

## 2021-01-22 DIAGNOSIS — F17210 Nicotine dependence, cigarettes, uncomplicated: Secondary | ICD-10-CM | POA: Diagnosis not present

## 2021-01-22 DIAGNOSIS — I10 Essential (primary) hypertension: Secondary | ICD-10-CM | POA: Diagnosis not present

## 2021-01-22 DIAGNOSIS — Z801 Family history of malignant neoplasm of trachea, bronchus and lung: Secondary | ICD-10-CM | POA: Diagnosis not present

## 2021-01-22 DIAGNOSIS — M5116 Intervertebral disc disorders with radiculopathy, lumbar region: Principal | ICD-10-CM | POA: Diagnosis present

## 2021-01-22 DIAGNOSIS — G4733 Obstructive sleep apnea (adult) (pediatric): Secondary | ICD-10-CM | POA: Diagnosis present

## 2021-01-22 DIAGNOSIS — M5416 Radiculopathy, lumbar region: Secondary | ICD-10-CM | POA: Diagnosis present

## 2021-01-22 DIAGNOSIS — Z9851 Tubal ligation status: Secondary | ICD-10-CM | POA: Diagnosis not present

## 2021-01-22 DIAGNOSIS — F419 Anxiety disorder, unspecified: Secondary | ICD-10-CM | POA: Diagnosis present

## 2021-01-22 DIAGNOSIS — Z90710 Acquired absence of both cervix and uterus: Secondary | ICD-10-CM | POA: Diagnosis not present

## 2021-01-22 DIAGNOSIS — M4316 Spondylolisthesis, lumbar region: Secondary | ICD-10-CM | POA: Diagnosis not present

## 2021-01-22 DIAGNOSIS — Z888 Allergy status to other drugs, medicaments and biological substances status: Secondary | ICD-10-CM | POA: Diagnosis not present

## 2021-01-22 DIAGNOSIS — M48061 Spinal stenosis, lumbar region without neurogenic claudication: Secondary | ICD-10-CM | POA: Diagnosis not present

## 2021-01-22 DIAGNOSIS — Z8249 Family history of ischemic heart disease and other diseases of the circulatory system: Secondary | ICD-10-CM

## 2021-01-22 DIAGNOSIS — Z8371 Family history of colonic polyps: Secondary | ICD-10-CM

## 2021-01-22 DIAGNOSIS — Z96653 Presence of artificial knee joint, bilateral: Secondary | ICD-10-CM | POA: Diagnosis present

## 2021-01-22 DIAGNOSIS — Z560 Unemployment, unspecified: Secondary | ICD-10-CM | POA: Diagnosis not present

## 2021-01-22 DIAGNOSIS — Z9989 Dependence on other enabling machines and devices: Secondary | ICD-10-CM | POA: Diagnosis not present

## 2021-01-22 DIAGNOSIS — Z79899 Other long term (current) drug therapy: Secondary | ICD-10-CM | POA: Diagnosis not present

## 2021-01-22 DIAGNOSIS — Z9889 Other specified postprocedural states: Secondary | ICD-10-CM | POA: Diagnosis not present

## 2021-01-22 DIAGNOSIS — F32A Depression, unspecified: Secondary | ICD-10-CM | POA: Diagnosis present

## 2021-01-22 DIAGNOSIS — K219 Gastro-esophageal reflux disease without esophagitis: Secondary | ICD-10-CM | POA: Diagnosis present

## 2021-01-22 DIAGNOSIS — E785 Hyperlipidemia, unspecified: Secondary | ICD-10-CM | POA: Diagnosis not present

## 2021-01-22 DIAGNOSIS — Z419 Encounter for procedure for purposes other than remedying health state, unspecified: Secondary | ICD-10-CM

## 2021-01-22 DIAGNOSIS — Z833 Family history of diabetes mellitus: Secondary | ICD-10-CM | POA: Diagnosis not present

## 2021-01-22 DIAGNOSIS — Z6841 Body Mass Index (BMI) 40.0 and over, adult: Secondary | ICD-10-CM

## 2021-01-22 DIAGNOSIS — Z20822 Contact with and (suspected) exposure to covid-19: Secondary | ICD-10-CM | POA: Diagnosis not present

## 2021-01-22 DIAGNOSIS — Z803 Family history of malignant neoplasm of breast: Secondary | ICD-10-CM

## 2021-01-22 DIAGNOSIS — G8929 Other chronic pain: Secondary | ICD-10-CM | POA: Diagnosis not present

## 2021-01-22 DIAGNOSIS — E119 Type 2 diabetes mellitus without complications: Secondary | ICD-10-CM | POA: Diagnosis present

## 2021-01-22 HISTORY — PX: ABDOMINAL EXPOSURE: SHX5708

## 2021-01-22 HISTORY — PX: LUMBAR PERCUTANEOUS PEDICLE SCREW 1 LEVEL: SHX5560

## 2021-01-22 HISTORY — PX: ANTERIOR LUMBAR FUSION: SHX1170

## 2021-01-22 LAB — POCT I-STAT 7, (LYTES, BLD GAS, ICA,H+H)
Acid-Base Excess: 1 mmol/L (ref 0.0–2.0)
Acid-Base Excess: 0 mmol/L (ref 0.0–2.0)
Bicarbonate: 26.4 mmol/L (ref 20.0–28.0)
Bicarbonate: 25.6 mmol/L (ref 20.0–28.0)
Calcium, Ion: 1.12 mmol/L — ABNORMAL LOW (ref 1.15–1.40)
Calcium, Ion: 1.11 mmol/L — ABNORMAL LOW (ref 1.15–1.40)
HCT: 39 % (ref 36.0–46.0)
HCT: 41 % (ref 36.0–46.0)
Hemoglobin: 13.3 g/dL (ref 12.0–15.0)
Hemoglobin: 13.9 g/dL (ref 12.0–15.0)
O2 Saturation: 100 %
O2 Saturation: 100 %
Patient temperature: 35.3
Patient temperature: 36.4
Potassium: 3.1 mmol/L — ABNORMAL LOW (ref 3.5–5.1)
Potassium: 3.9 mmol/L (ref 3.5–5.1)
Sodium: 135 mmol/L (ref 135–145)
Sodium: 133 mmol/L — ABNORMAL LOW (ref 135–145)
TCO2: 28 mmol/L (ref 22–32)
TCO2: 27 mmol/L (ref 22–32)
pCO2 arterial: 40.7 mmHg (ref 32.0–48.0)
pCO2 arterial: 45.2 mmHg (ref 32.0–48.0)
pH, Arterial: 7.413 (ref 7.350–7.450)
pH, Arterial: 7.358 (ref 7.350–7.450)
pO2, Arterial: 207 mmHg — ABNORMAL HIGH (ref 83.0–108.0)
pO2, Arterial: 176 mmHg — ABNORMAL HIGH (ref 83.0–108.0)

## 2021-01-22 LAB — GLUCOSE, CAPILLARY: Glucose-Capillary: 132 mg/dL — ABNORMAL HIGH (ref 70–99)

## 2021-01-22 LAB — PREPARE RBC (CROSSMATCH)

## 2021-01-22 IMAGING — CR DG OR LOCAL ABDOMEN
1 series · 1 of 1 positions shown · non-contrast
Comparison: None.

CLINICAL DATA: Postoperative evaluation

EXAM:
OR LOCAL ABDOMEN

[AP]
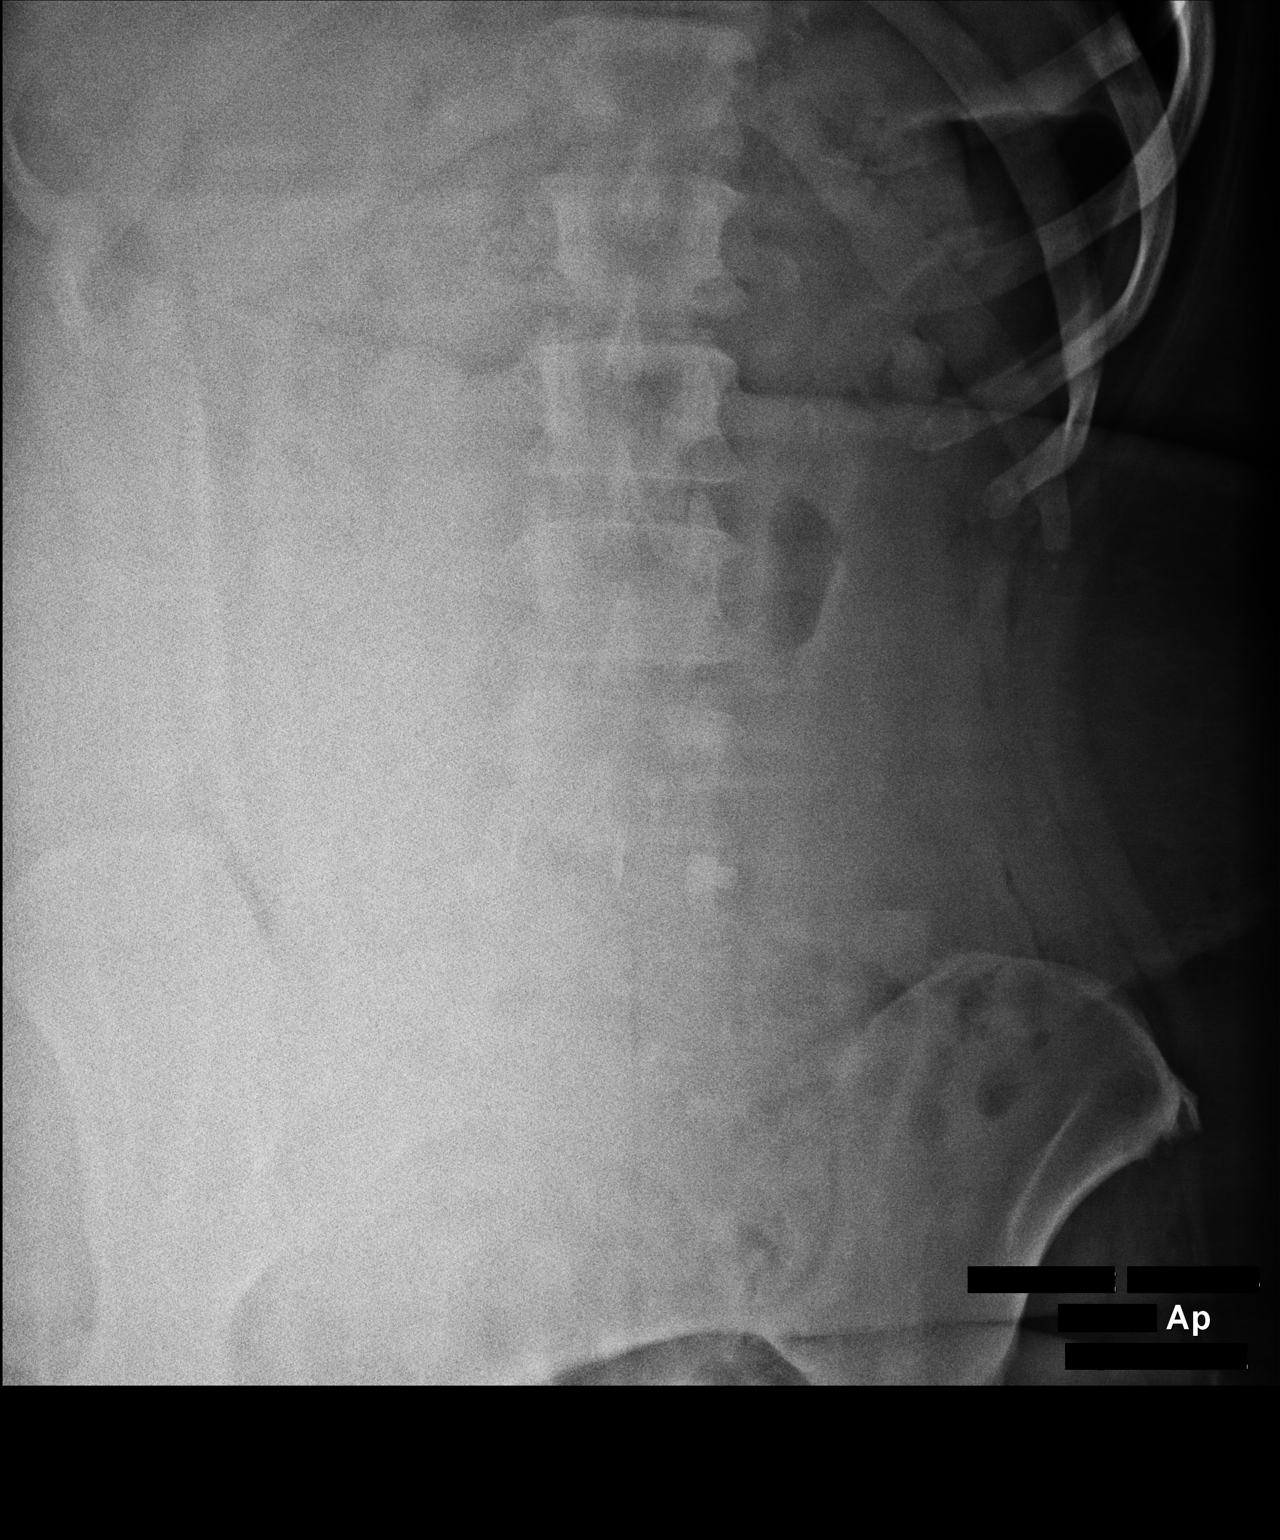

[1 of 1 positions shown; findings below may reference images not displayed]

FINDINGS: Metallic foreign body at the level of L4-5 noted on the left. No
needle or instrument is evident. No sponge evident. No bowel
dilatation or air-fluid level to suggest bowel obstruction. No free
air.
IMPRESSION: Metallic foreign body noted on the left at the L4-5 level,
consistent with surgically placed disc spacer. No retained needle,
instrument, or sponge appreciable. Bowel gas pattern unremarkable.

These results were called by telephone at the time of interpretation
on [DATE] at [DATE] to provider KABURAGI, RN, throughout
knowledge receipt of report via phone. This call was made while
patient was still in operating room.

## 2021-01-22 IMAGING — RF DG C-ARM 1-60 MIN
1 series · 14 of 14 positions shown · non-contrast
Comparison: Lumbar radiographs [DATE].

CLINICAL DATA: KEMON) and percutaneous screws at L4-L5.

EXAM:
DG C-ARM 1-60 MIN; LUMBAR SPINE - 2-3 VIEW
FLUOROSCOPY TIME:  Fluoroscopy Time:  6 minutes 59 seconds.
Radiation Exposure Index (if provided by the fluoroscopic device):
595.35 mGy

[Series 1: run · 14 of 14 slices shown]
[im 1/14]
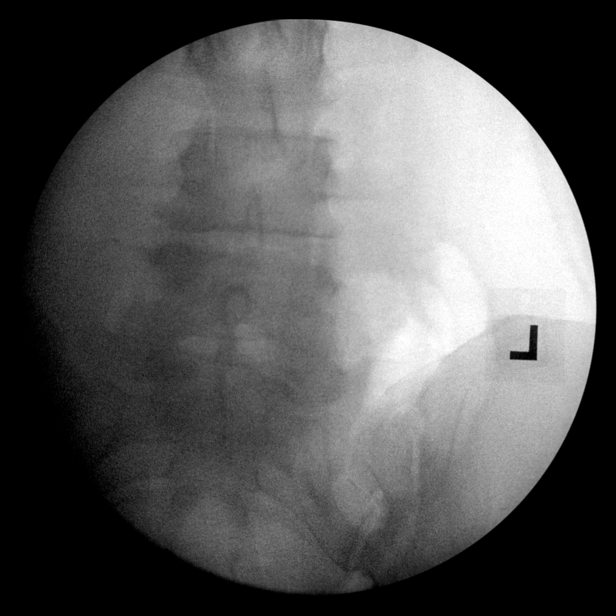
[im 2/14]
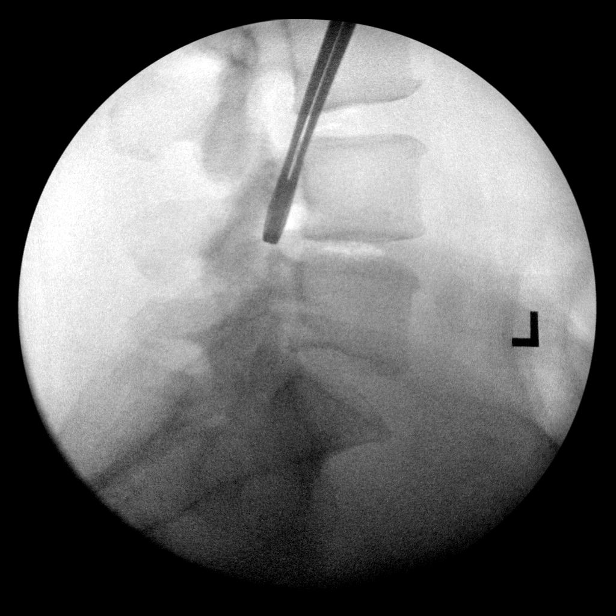
[im 3/14]
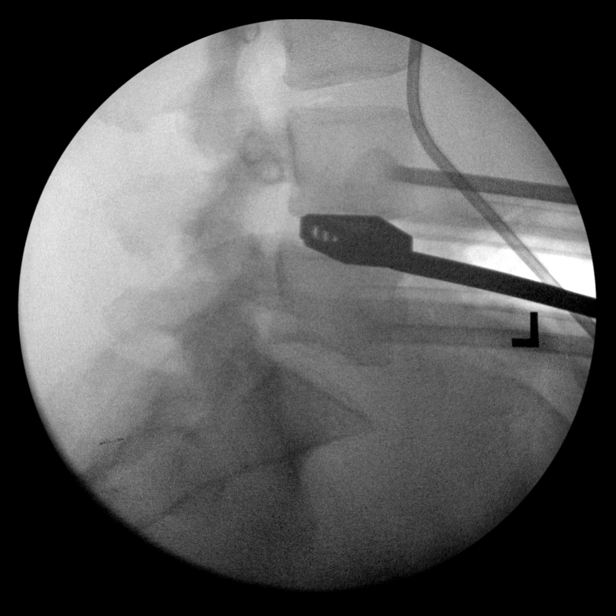
[im 4/14]
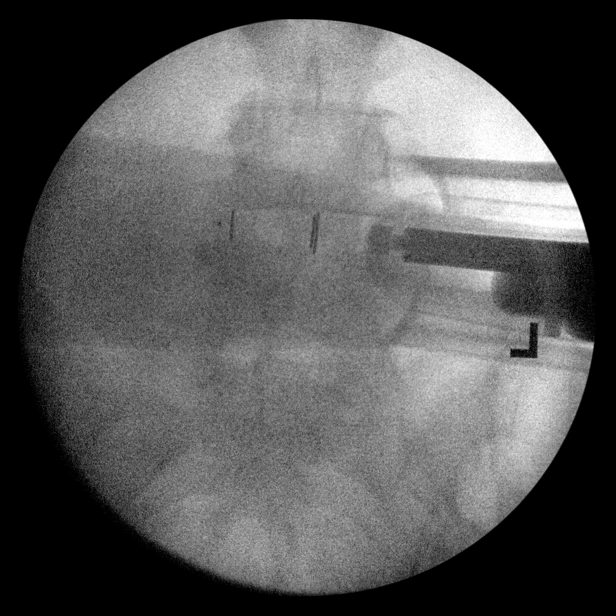
[im 5/14]
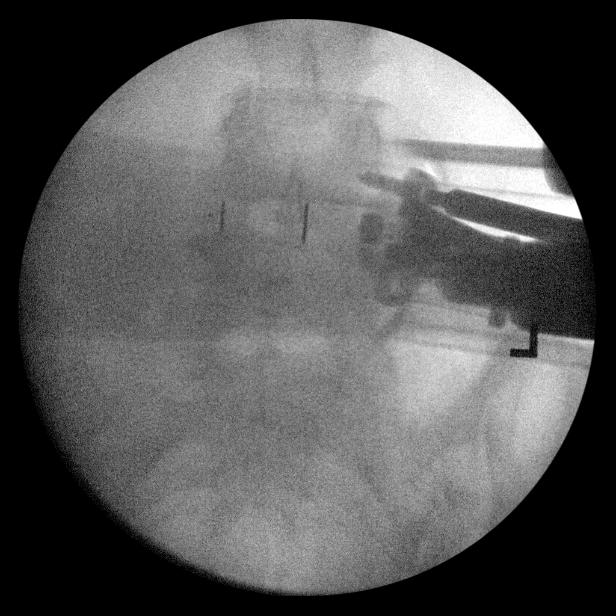
[im 6/14]
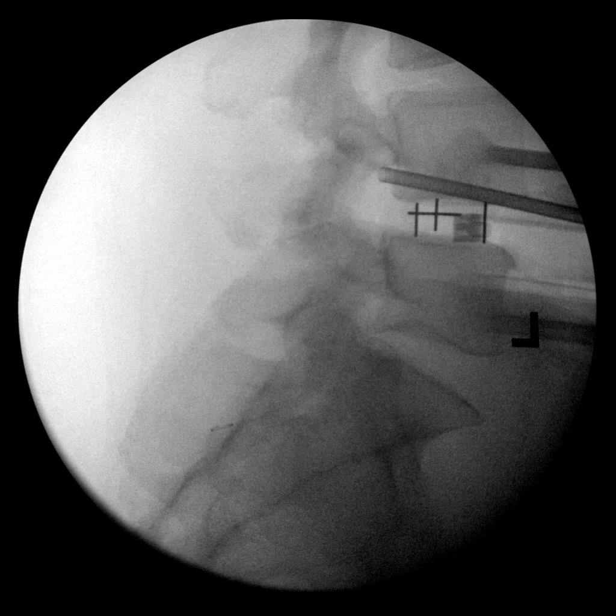
[im 7/14]
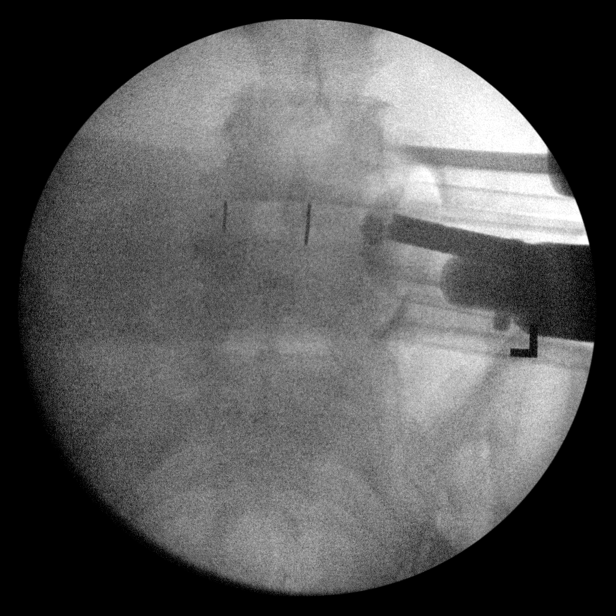
[im 8/14]
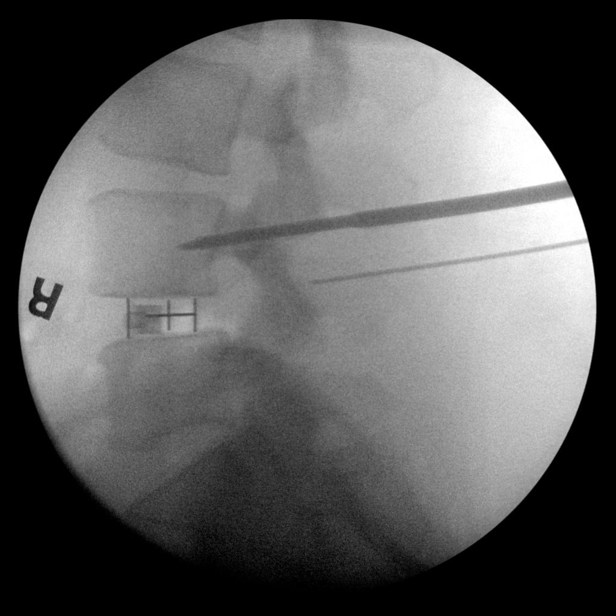
[im 9/14]
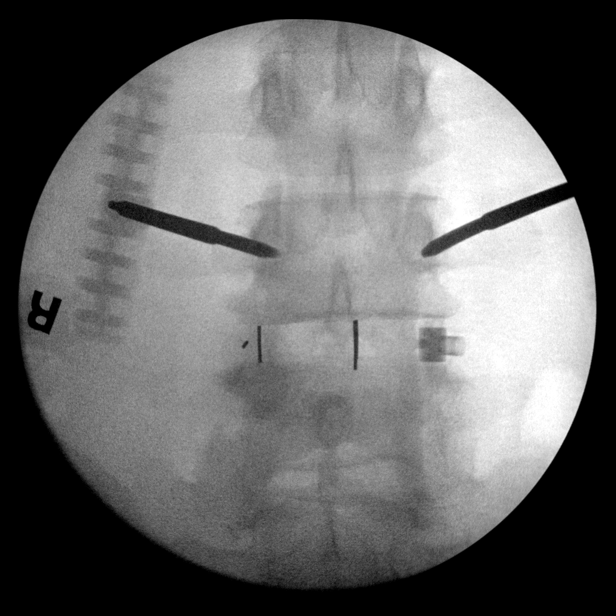
[im 10/14]
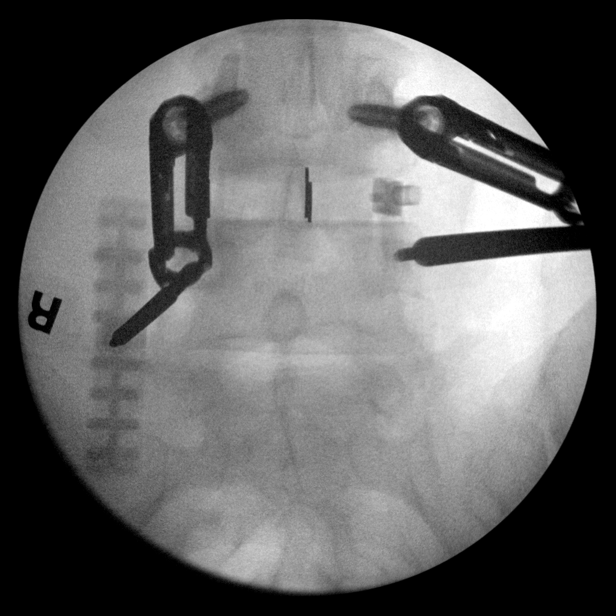
[im 11/14]
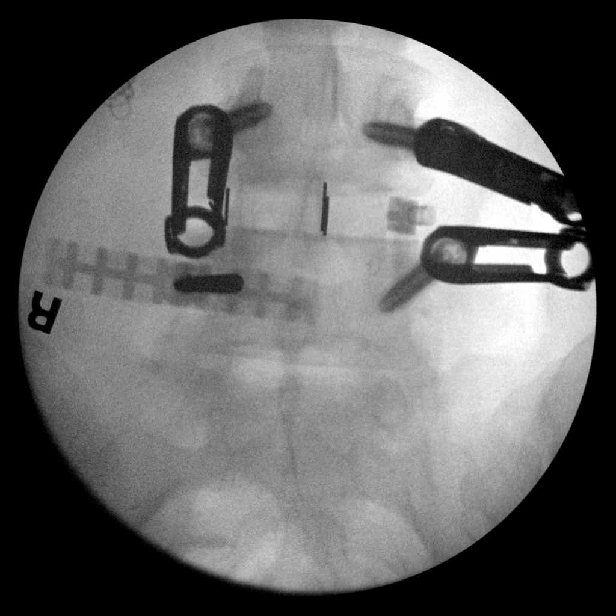
[im 12/14]
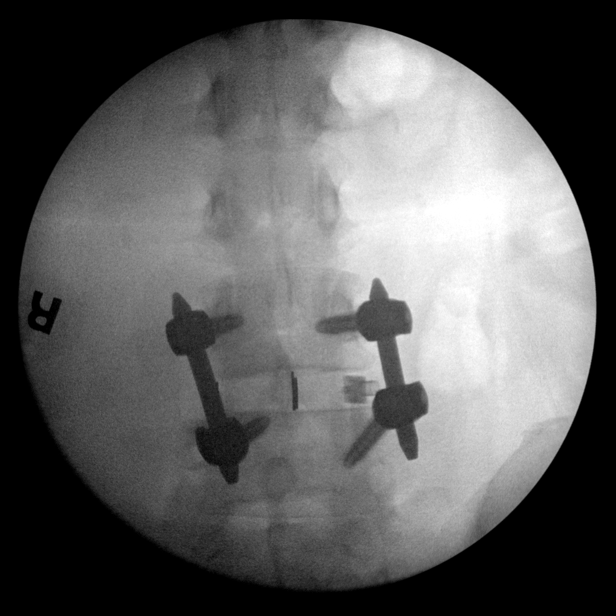
[im 13/14]
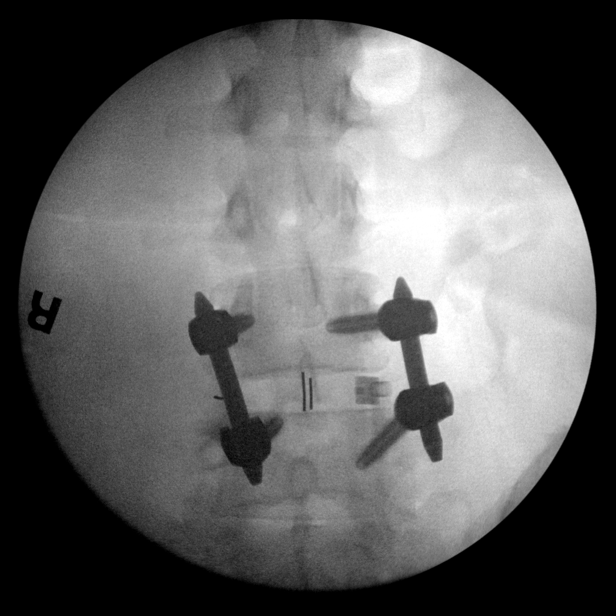
[im 14/14]
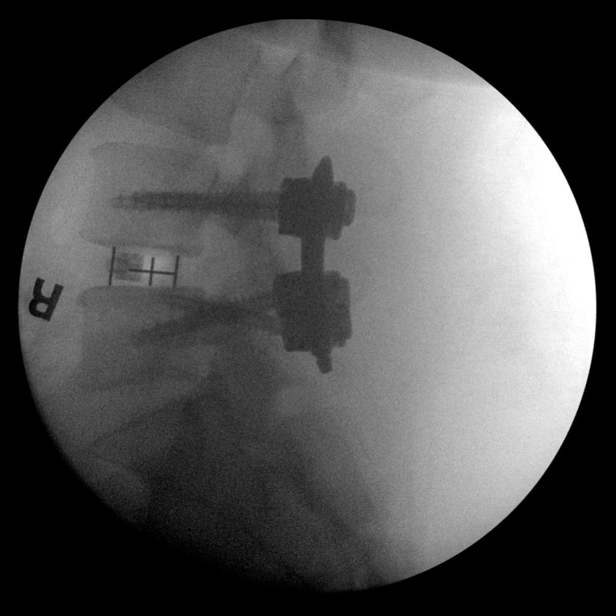

[14 of 14 positions shown; findings below may reference images not displayed]

FINDINGS: Fourteen C-arm fluoroscopic images were obtained intraoperatively
and submitted for post operative interpretation. These images
demonstrate sequential changes associated with placement of
bilateral percutaneous pedicle screws at L4 and L5 with intervening
rods and interbody graft. Please see the performing provider's
procedural report for further detail.
IMPRESSION: L4-L5 KEMON.

## 2021-01-22 IMAGING — RF DG LUMBAR SPINE 2-3V
1 series · 14 of 14 positions shown · non-contrast
Comparison: Lumbar radiographs [DATE].

CLINICAL DATA: KEMON) and percutaneous screws at L4-L5.

EXAM:
DG C-ARM 1-60 MIN; LUMBAR SPINE - 2-3 VIEW
FLUOROSCOPY TIME:  Fluoroscopy Time:  6 minutes 59 seconds.
Radiation Exposure Index (if provided by the fluoroscopic device):
595.35 mGy

[Series 1: run · 14 of 14 slices shown]
[im 1/14]
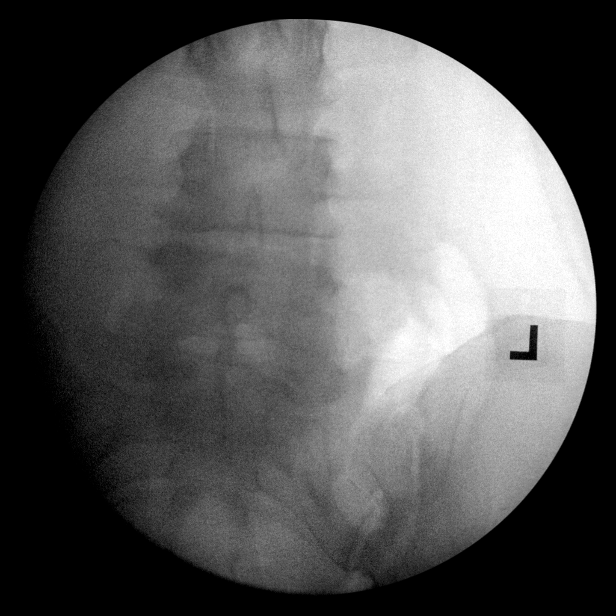
[im 2/14]
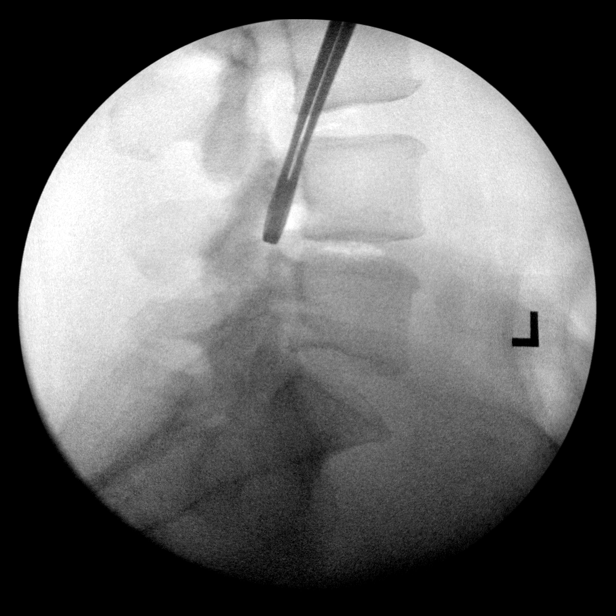
[im 3/14]
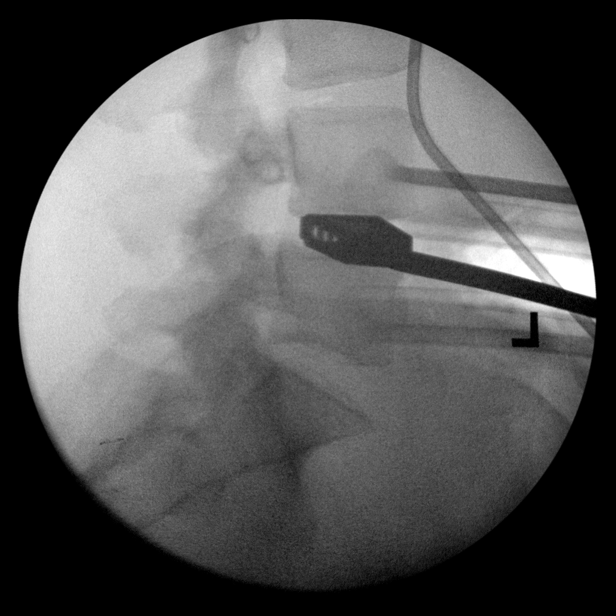
[im 4/14]
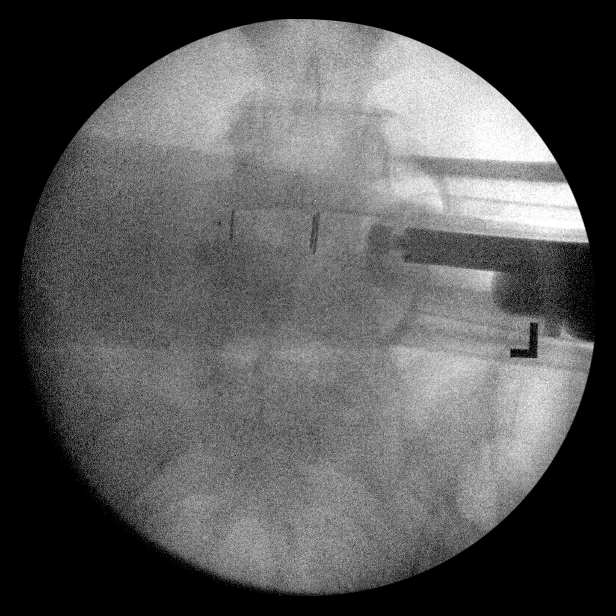
[im 5/14]
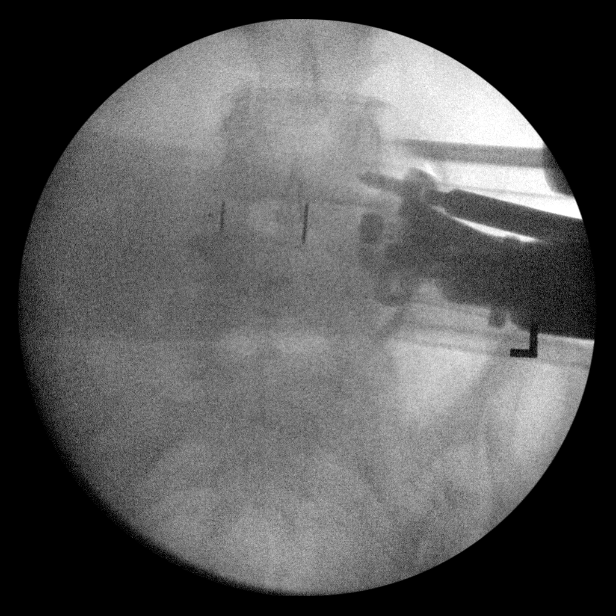
[im 6/14]
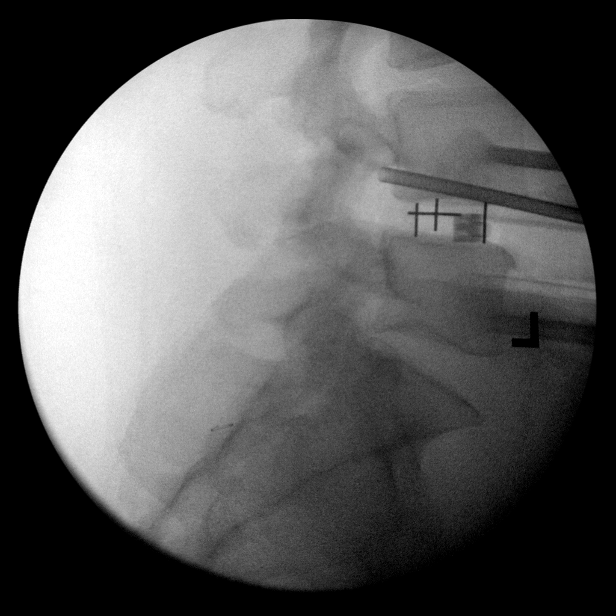
[im 7/14]
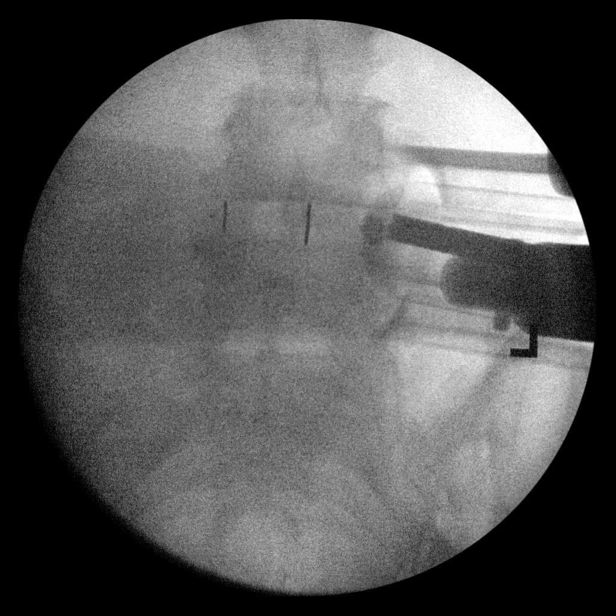
[im 8/14]
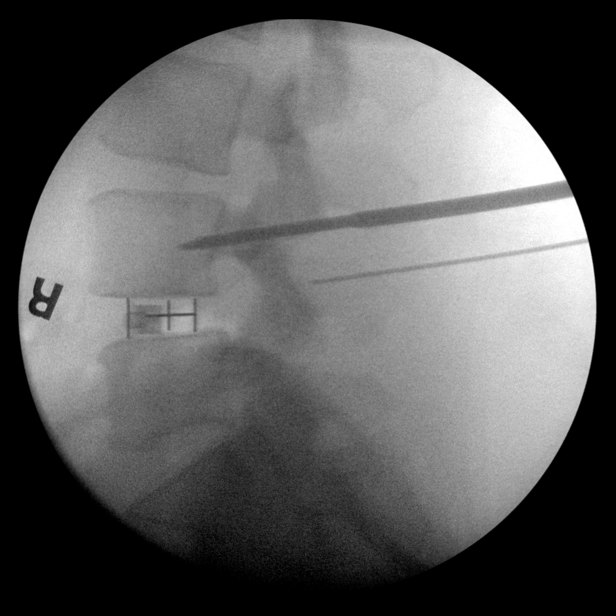
[im 9/14]
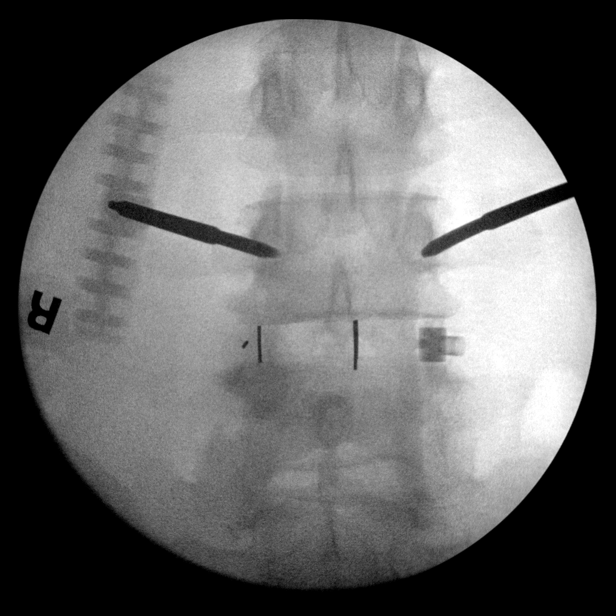
[im 10/14]
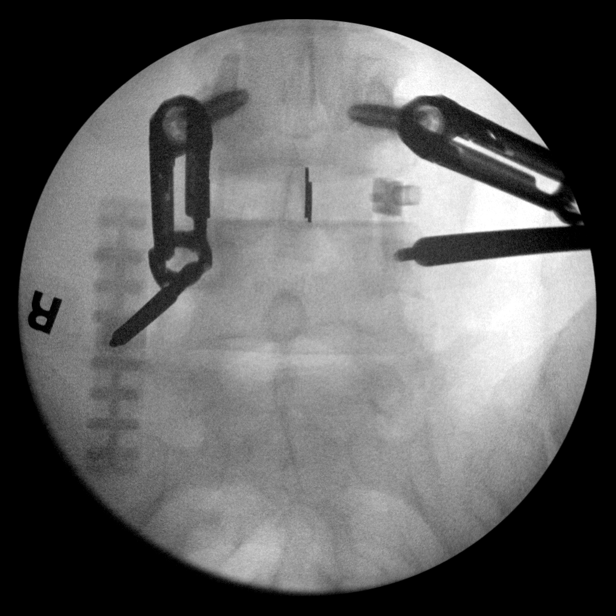
[im 11/14]
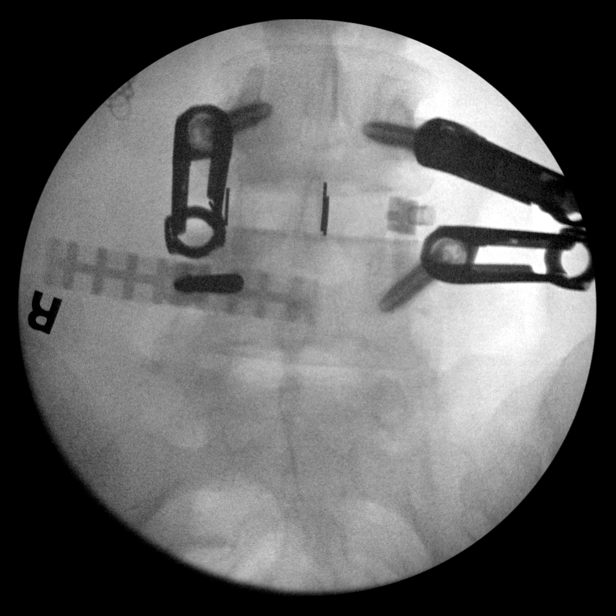
[im 12/14]
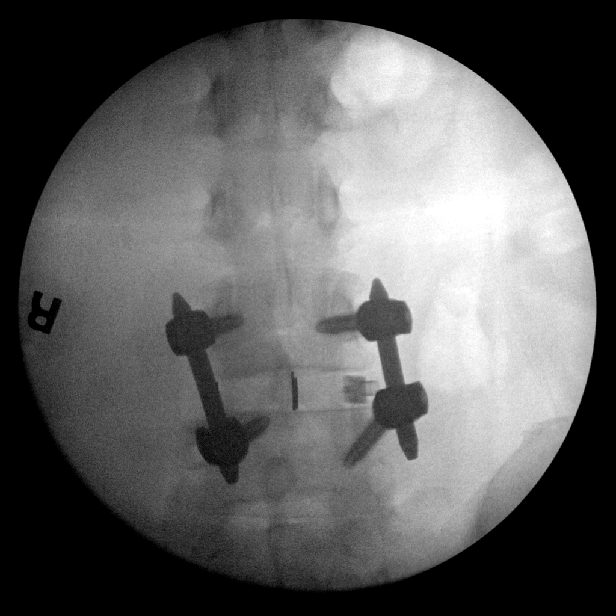
[im 13/14]
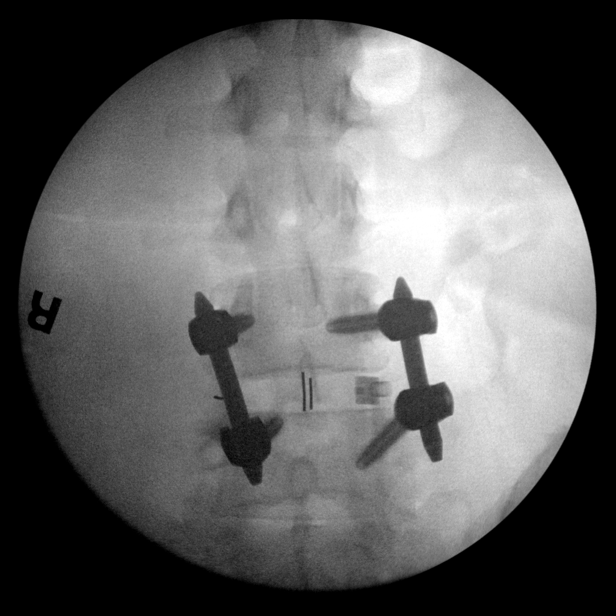
[im 14/14]
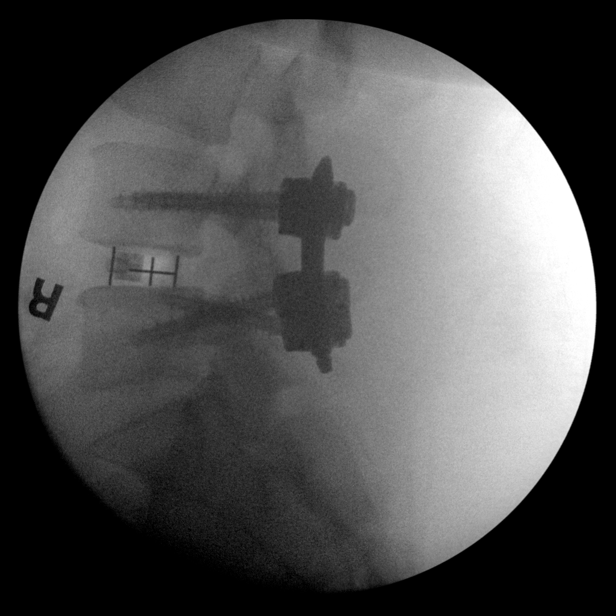

[14 of 14 positions shown; findings below may reference images not displayed]

FINDINGS: Fourteen C-arm fluoroscopic images were obtained intraoperatively
and submitted for post operative interpretation. These images
demonstrate sequential changes associated with placement of
bilateral percutaneous pedicle screws at L4 and L5 with intervening
rods and interbody graft. Please see the performing provider's
procedural report for further detail.
IMPRESSION: L4-L5 KEMON.

## 2021-01-22 SURGERY — ANTERIOR LUMBAR FUSION 1 LEVEL
Anesthesia: General

## 2021-01-22 MED ORDER — PHENYLEPHRINE HCL (PRESSORS) 10 MG/ML IV SOLN
INTRAVENOUS | Status: DC | PRN
  Administered 2021-01-22 (×3): 80 ug via INTRAVENOUS

## 2021-01-22 MED ORDER — OXYCODONE-ACETAMINOPHEN 5-325 MG PO TABS
1.0000 | ORAL_TABLET | Freq: Four times a day (QID) | ORAL | Status: DC | PRN
  Administered 2021-01-22 – 2021-01-23 (×4): 2 via ORAL
  Filled 2021-01-22 (×4): qty 2

## 2021-01-22 MED ORDER — PROMETHAZINE HCL 25 MG/ML IJ SOLN
6.2500 mg | INTRAMUSCULAR | Status: DC | PRN
  Administered 2021-01-22: 12.5 mg via INTRAVENOUS

## 2021-01-22 MED ORDER — POTASSIUM CHLORIDE 10 MEQ/100ML IV SOLN
10.0000 meq | INTRAVENOUS | Status: DC
  Filled 2021-01-22: qty 100

## 2021-01-22 MED ORDER — DEXAMETHASONE SODIUM PHOSPHATE 10 MG/ML IJ SOLN
INTRAMUSCULAR | Status: AC
  Filled 2021-01-22: qty 1

## 2021-01-22 MED ORDER — MENTHOL 3 MG MT LOZG: 1.0000 | LOZENGE | OROMUCOSAL | Status: DC | PRN

## 2021-01-22 MED ORDER — ACETAMINOPHEN 650 MG RE SUPP: 650.0000 mg | RECTAL | Status: DC | PRN

## 2021-01-22 MED ORDER — AMITRIPTYLINE HCL 25 MG PO TABS
25.0000 mg | ORAL_TABLET | Freq: Every day | ORAL | Status: DC
  Administered 2021-01-22: 25 mg via ORAL
  Filled 2021-01-22 (×2): qty 1

## 2021-01-22 MED ORDER — FENTANYL CITRATE (PF) 100 MCG/2ML IJ SOLN
INTRAMUSCULAR | Status: AC
  Filled 2021-01-22: qty 2

## 2021-01-22 MED ORDER — ROCURONIUM BROMIDE 10 MG/ML (PF) SYRINGE
PREFILLED_SYRINGE | INTRAVENOUS | Status: AC
  Filled 2021-01-22: qty 10

## 2021-01-22 MED ORDER — PANTOPRAZOLE SODIUM 40 MG PO TBEC
40.0000 mg | DELAYED_RELEASE_TABLET | Freq: Every day | ORAL | Status: DC
  Administered 2021-01-23: 40 mg via ORAL
  Filled 2021-01-22: qty 1

## 2021-01-22 MED ORDER — ONDANSETRON HCL 4 MG/2ML IJ SOLN
INTRAMUSCULAR | Status: DC | PRN
  Administered 2021-01-22 (×2): 4 mg via INTRAVENOUS

## 2021-01-22 MED ORDER — ONDANSETRON HCL 4 MG/2ML IJ SOLN
INTRAMUSCULAR | Status: AC
  Filled 2021-01-22: qty 2

## 2021-01-22 MED ORDER — PHENYLEPHRINE HCL-NACL 10-0.9 MG/250ML-% IV SOLN
INTRAVENOUS | Status: DC | PRN
  Administered 2021-01-22: 15 ug/min via INTRAVENOUS

## 2021-01-22 MED ORDER — CHLORHEXIDINE GLUCONATE 4 % EX LIQD: 60.0000 mL | Freq: Once | CUTANEOUS | Status: DC

## 2021-01-22 MED ORDER — LIDOCAINE-EPINEPHRINE 1 %-1:100000 IJ SOLN
INTRAMUSCULAR | Status: AC
  Filled 2021-01-22: qty 1

## 2021-01-22 MED ORDER — MIDAZOLAM HCL 2 MG/2ML IJ SOLN
INTRAMUSCULAR | Status: AC
  Filled 2021-01-22: qty 2

## 2021-01-22 MED ORDER — FENTANYL CITRATE (PF) 250 MCG/5ML IJ SOLN
INTRAMUSCULAR | Status: AC
  Filled 2021-01-22: qty 5

## 2021-01-22 MED ORDER — HYDROMORPHONE HCL 1 MG/ML IJ SOLN
INTRAMUSCULAR | Status: DC | PRN
Start: 2021-01-22 — End: 2021-01-22
  Administered 2021-01-22 (×2): .5 mg via INTRAVENOUS

## 2021-01-22 MED ORDER — 0.9 % SODIUM CHLORIDE (POUR BTL) OPTIME
TOPICAL | Status: DC | PRN
  Administered 2021-01-22: 1000 mL

## 2021-01-22 MED ORDER — PROPOFOL 10 MG/ML IV BOLUS
INTRAVENOUS | Status: AC
  Filled 2021-01-22: qty 20

## 2021-01-22 MED ORDER — CEFAZOLIN SODIUM-DEXTROSE 2-4 GM/100ML-% IV SOLN
2.0000 g | Freq: Three times a day (TID) | INTRAVENOUS | Status: DC
  Administered 2021-01-22 – 2021-01-23 (×2): 2 g via INTRAVENOUS
  Filled 2021-01-22 (×2): qty 100

## 2021-01-22 MED ORDER — FENTANYL CITRATE (PF) 100 MCG/2ML IJ SOLN
25.0000 ug | INTRAMUSCULAR | Status: DC | PRN
  Administered 2021-01-22 (×5): 25 ug via INTRAVENOUS

## 2021-01-22 MED ORDER — DEXAMETHASONE SODIUM PHOSPHATE 10 MG/ML IJ SOLN
INTRAMUSCULAR | Status: DC | PRN
  Administered 2021-01-22: 8 mg via INTRAVENOUS

## 2021-01-22 MED ORDER — BUPIVACAINE LIPOSOME 1.3 % IJ SUSP
20.0000 mL | INTRAMUSCULAR | Status: AC
  Administered 2021-01-22: 20 mL
  Filled 2021-01-22: qty 20

## 2021-01-22 MED ORDER — HYDROMORPHONE HCL 1 MG/ML IJ SOLN
INTRAMUSCULAR | Status: AC
  Filled 2021-01-22: qty 0.5

## 2021-01-22 MED ORDER — ORAL CARE MOUTH RINSE: 15.0000 mL | Freq: Once | OROMUCOSAL | Status: AC

## 2021-01-22 MED ORDER — THROMBIN 5000 UNITS EX SOLR
CUTANEOUS | Status: AC
  Filled 2021-01-22: qty 5000

## 2021-01-22 MED ORDER — DEXTROSE 5 % IV SOLN
3.0000 g | INTRAVENOUS | Status: DC
  Filled 2021-01-22: qty 3000

## 2021-01-22 MED ORDER — PHENOL 1.4 % MT LIQD: 1.0000 | OROMUCOSAL | Status: DC | PRN

## 2021-01-22 MED ORDER — ENOXAPARIN SODIUM 40 MG/0.4ML ~~LOC~~ SOLN
40.0000 mg | SUBCUTANEOUS | Status: DC
  Administered 2021-01-23: 40 mg via SUBCUTANEOUS
  Filled 2021-01-22: qty 0.4

## 2021-01-22 MED ORDER — PROMETHAZINE HCL 25 MG/ML IJ SOLN
INTRAMUSCULAR | Status: AC
  Filled 2021-01-22: qty 1

## 2021-01-22 MED ORDER — LACTATED RINGERS IV SOLN: INTRAVENOUS | Status: DC

## 2021-01-22 MED ORDER — MIDAZOLAM HCL 5 MG/5ML IJ SOLN
INTRAMUSCULAR | Status: DC | PRN
  Administered 2021-01-22: 2 mg via INTRAVENOUS

## 2021-01-22 MED ORDER — LACTATED RINGERS IV SOLN: INTRAVENOUS | Status: DC | PRN

## 2021-01-22 MED ORDER — SODIUM CHLORIDE 0.9 % IV SOLN
250.0000 mL | INTRAVENOUS | Status: DC
  Administered 2021-01-22: 250 mL via INTRAVENOUS

## 2021-01-22 MED ORDER — NITROGLYCERIN 0.4 MG SL SUBL: 0.4000 mg | SUBLINGUAL_TABLET | SUBLINGUAL | Status: DC | PRN

## 2021-01-22 MED ORDER — DOCUSATE SODIUM 100 MG PO CAPS
100.0000 mg | ORAL_CAPSULE | Freq: Two times a day (BID) | ORAL | Status: DC
  Administered 2021-01-22 – 2021-01-23 (×2): 100 mg via ORAL
  Filled 2021-01-22 (×2): qty 1

## 2021-01-22 MED ORDER — LIDOCAINE 2% (20 MG/ML) 5 ML SYRINGE
INTRAMUSCULAR | Status: AC
  Filled 2021-01-22: qty 10

## 2021-01-22 MED ORDER — CHLORHEXIDINE GLUCONATE 0.12 % MT SOLN
15.0000 mL | Freq: Once | OROMUCOSAL | Status: AC
  Administered 2021-01-22: 15 mL via OROMUCOSAL
  Filled 2021-01-22: qty 15

## 2021-01-22 MED ORDER — ROCURONIUM BROMIDE 10 MG/ML (PF) SYRINGE
PREFILLED_SYRINGE | INTRAVENOUS | Status: DC | PRN
  Administered 2021-01-22: 20 mg via INTRAVENOUS
  Administered 2021-01-22: 70 mg via INTRAVENOUS
  Administered 2021-01-22: 10 mg via INTRAVENOUS
  Administered 2021-01-22: 15 mg via INTRAVENOUS
  Administered 2021-01-22: 10 mg via INTRAVENOUS
  Administered 2021-01-22: 30 mg via INTRAVENOUS
  Administered 2021-01-22: 20 mg via INTRAVENOUS

## 2021-01-22 MED ORDER — SODIUM CHLORIDE 0.9% FLUSH
3.0000 mL | Freq: Two times a day (BID) | INTRAVENOUS | Status: DC
  Administered 2021-01-22: 3 mL via INTRAVENOUS

## 2021-01-22 MED ORDER — LOSARTAN POTASSIUM 50 MG PO TABS
50.0000 mg | ORAL_TABLET | Freq: Every day | ORAL | Status: DC
  Administered 2021-01-22 – 2021-01-23 (×2): 50 mg via ORAL
  Filled 2021-01-22 (×2): qty 1

## 2021-01-22 MED ORDER — LIDOCAINE-EPINEPHRINE 1 %-1:100000 IJ SOLN
INTRAMUSCULAR | Status: DC | PRN
  Administered 2021-01-22: 8 mL

## 2021-01-22 MED ORDER — LIDOCAINE 2% (20 MG/ML) 5 ML SYRINGE
INTRAMUSCULAR | Status: DC | PRN
  Administered 2021-01-22: 100 mg via INTRAVENOUS

## 2021-01-22 MED ORDER — POTASSIUM CHLORIDE 10 MEQ/100ML IV SOLN
INTRAVENOUS | Status: DC | PRN
  Administered 2021-01-22 (×2): 10 meq via INTRAVENOUS

## 2021-01-22 MED ORDER — FLEET ENEMA 7-19 GM/118ML RE ENEM: 1.0000 | ENEMA | Freq: Once | RECTAL | Status: DC | PRN

## 2021-01-22 MED ORDER — SODIUM CHLORIDE 0.9% FLUSH: 3.0000 mL | INTRAVENOUS | Status: DC | PRN

## 2021-01-22 MED ORDER — PROPRANOLOL HCL 10 MG PO TABS
10.0000 mg | ORAL_TABLET | Freq: Two times a day (BID) | ORAL | Status: DC
  Administered 2021-01-22 – 2021-01-23 (×2): 10 mg via ORAL
  Filled 2021-01-22 (×3): qty 1

## 2021-01-22 MED ORDER — PHENYLEPHRINE 40 MCG/ML (10ML) SYRINGE FOR IV PUSH (FOR BLOOD PRESSURE SUPPORT)
PREFILLED_SYRINGE | INTRAVENOUS | Status: AC
  Filled 2021-01-22: qty 10

## 2021-01-22 MED ORDER — SODIUM CHLORIDE 0.9% IV SOLUTION: Freq: Once | INTRAVENOUS | Status: DC

## 2021-01-22 MED ORDER — PROPOFOL 10 MG/ML IV BOLUS
INTRAVENOUS | Status: DC | PRN
Start: 2021-01-22 — End: 2021-01-22
  Administered 2021-01-22: 180 mg via INTRAVENOUS

## 2021-01-22 MED ORDER — PROPOFOL 500 MG/50ML IV EMUL
INTRAVENOUS | Status: DC | PRN
  Administered 2021-01-22: 20 ug/kg/min via INTRAVENOUS

## 2021-01-22 MED ORDER — METHOCARBAMOL 500 MG PO TABS
1000.0000 mg | ORAL_TABLET | Freq: Three times a day (TID) | ORAL | Status: DC | PRN
  Administered 2021-01-22 – 2021-01-23 (×3): 1000 mg via ORAL
  Filled 2021-01-22 (×3): qty 2

## 2021-01-22 MED ORDER — ACETAMINOPHEN 325 MG PO TABS
650.0000 mg | ORAL_TABLET | ORAL | Status: DC | PRN
  Administered 2021-01-23: 650 mg via ORAL
  Filled 2021-01-22: qty 2

## 2021-01-22 MED ORDER — BUPIVACAINE HCL (PF) 0.5 % IJ SOLN
INTRAMUSCULAR | Status: AC
  Filled 2021-01-22: qty 30

## 2021-01-22 MED ORDER — ONDANSETRON HCL 4 MG/2ML IJ SOLN: 4.0000 mg | Freq: Four times a day (QID) | INTRAMUSCULAR | Status: DC | PRN

## 2021-01-22 MED ORDER — FENTANYL CITRATE (PF) 100 MCG/2ML IJ SOLN
INTRAMUSCULAR | Status: DC | PRN
  Administered 2021-01-22 (×5): 50 ug via INTRAVENOUS
  Administered 2021-01-22: 150 ug via INTRAVENOUS
  Administered 2021-01-22 (×2): 50 ug via INTRAVENOUS

## 2021-01-22 MED ORDER — ACETAMINOPHEN 500 MG PO TABS
1000.0000 mg | ORAL_TABLET | Freq: Once | ORAL | Status: AC
  Administered 2021-01-22: 1000 mg via ORAL
  Filled 2021-01-22: qty 2

## 2021-01-22 MED ORDER — THROMBIN 5000 UNITS EX SOLR
OROMUCOSAL | Status: DC | PRN
  Administered 2021-01-22: 5 mL via TOPICAL

## 2021-01-22 MED ORDER — HYDROCHLOROTHIAZIDE 25 MG PO TABS
25.0000 mg | ORAL_TABLET | Freq: Every day | ORAL | Status: DC
  Administered 2021-01-22 – 2021-01-23 (×2): 25 mg via ORAL
  Filled 2021-01-22 (×2): qty 1

## 2021-01-22 MED ORDER — SUGAMMADEX SODIUM 200 MG/2ML IV SOLN
INTRAVENOUS | Status: DC | PRN
  Administered 2021-01-22: 400 mg via INTRAVENOUS

## 2021-01-22 MED ORDER — ONDANSETRON HCL 4 MG PO TABS: 4.0000 mg | ORAL_TABLET | Freq: Four times a day (QID) | ORAL | Status: DC | PRN

## 2021-01-22 MED ORDER — BUPIVACAINE HCL (PF) 0.5 % IJ SOLN
INTRAMUSCULAR | Status: DC | PRN
  Administered 2021-01-22: 20 mL

## 2021-01-22 MED ORDER — POTASSIUM CHLORIDE IN NACL 20-0.9 MEQ/L-% IV SOLN
INTRAVENOUS | Status: DC
  Filled 2021-01-22: qty 1000

## 2021-01-22 MED ORDER — MORPHINE SULFATE (PF) 2 MG/ML IV SOLN
2.0000 mg | INTRAVENOUS | Status: DC | PRN
  Administered 2021-01-22 – 2021-01-23 (×2): 2 mg via INTRAVENOUS
  Filled 2021-01-22 (×2): qty 1

## 2021-01-22 SURGICAL SUPPLY — 108 items
APPLIER CLIP 11 MED OPEN (CLIP) ×3
BAND RUBBER #18 3X1/16 STRL (MISCELLANEOUS) IMPLANT
BASKET BONE COLLECTION (BASKET) IMPLANT
BENZOIN TINCTURE PRP APPL 2/3 (GAUZE/BANDAGES/DRESSINGS) IMPLANT
BLADE CLIPPER SURG (BLADE) IMPLANT
BUR CARBIDE MATCH 3.0 (BURR) IMPLANT
BUR PRECISION FLUTE 5.0 (BURR) IMPLANT
CANISTER SUCT 3000ML PPV (MISCELLANEOUS) ×6 IMPLANT
CLIP APPLIE 11 MED OPEN (CLIP) ×1 IMPLANT
CLIP LIGATING EXTRA MED SLVR (CLIP) ×3 IMPLANT
CLIP LIGATING EXTRA SM BLUE (MISCELLANEOUS) ×3 IMPLANT
CLOSURE WOUND 1/2 X4 (GAUZE/BANDAGES/DRESSINGS)
COVER BACK TABLE 60X90IN (DRAPES) ×3 IMPLANT
COVER WAND RF STERILE (DRAPES) IMPLANT
DECANTER SPIKE VIAL GLASS SM (MISCELLANEOUS) ×6 IMPLANT
DERMABOND ADVANCED (GAUZE/BANDAGES/DRESSINGS) ×4
DERMABOND ADVANCED .7 DNX12 (GAUZE/BANDAGES/DRESSINGS) ×2 IMPLANT
DISSECTOR BLUNT TIP ENDO 5MM (MISCELLANEOUS) ×6 IMPLANT
DRAPE C-ARM 42X72 X-RAY (DRAPES) ×6 IMPLANT
DRAPE C-ARMOR (DRAPES) ×6 IMPLANT
DRAPE LAPAROTOMY 100X72X124 (DRAPES) ×6 IMPLANT
DRAPE SURG 17X23 STRL (DRAPES) ×3 IMPLANT
DURAPREP 26ML APPLICATOR (WOUND CARE) ×6 IMPLANT
ELECT BLADE 4.0 EZ CLEAN MEGAD (MISCELLANEOUS) ×3
ELECT BLADE INSULATED 6.5IN (ELECTROSURGICAL) ×3
ELECT REM PT RETURN 9FT ADLT (ELECTROSURGICAL) ×6
ELECTRODE BLDE 4.0 EZ CLN MEGD (MISCELLANEOUS) ×1 IMPLANT
ELECTRODE BLDE INSULATED 6.5IN (ELECTROSURGICAL) ×1 IMPLANT
ELECTRODE REM PT RTRN 9FT ADLT (ELECTROSURGICAL) ×2 IMPLANT
EXTENDER TAB GUIDE SV 5.5/6.0 (INSTRUMENTS) ×24 IMPLANT
FORCEPS BIPOLAR TI 190MM (INSTRUMENTS) ×3 IMPLANT
GAUZE 4X4 16PLY RFD (DISPOSABLE) IMPLANT
GAUZE SPONGE 4X4 12PLY STRL (GAUZE/BANDAGES/DRESSINGS) IMPLANT
GLOVE BIO SURGEON STRL SZ7.5 (GLOVE) ×3 IMPLANT
GLOVE BIOGEL PI IND STRL 7.5 (GLOVE) ×2 IMPLANT
GLOVE BIOGEL PI INDICATOR 7.5 (GLOVE) ×4
GLOVE ECLIPSE 7.5 STRL STRAW (GLOVE) ×6 IMPLANT
GLOVE SRG 8 PF TXTR STRL LF DI (GLOVE) ×1 IMPLANT
GLOVE SS BIOGEL STRL SZ 7.5 (GLOVE) ×1 IMPLANT
GLOVE SUPERSENSE BIOGEL SZ 7.5 (GLOVE) ×2
GLOVE SURG UNDER POLY LF SZ8 (GLOVE) ×2
GOWN STRL REUS W/ TWL LRG LVL3 (GOWN DISPOSABLE) ×2 IMPLANT
GOWN STRL REUS W/ TWL XL LVL3 (GOWN DISPOSABLE) ×3 IMPLANT
GOWN STRL REUS W/TWL 2XL LVL3 (GOWN DISPOSABLE) ×6 IMPLANT
GOWN STRL REUS W/TWL LRG LVL3 (GOWN DISPOSABLE) ×4
GOWN STRL REUS W/TWL XL LVL3 (GOWN DISPOSABLE) ×6
GUIDEWIRE BLUNT NT 450 (WIRE) ×12 IMPLANT
GUIDEWIRE SEXTANT (WIRE) ×3 IMPLANT
HEMOSTAT POWDER KIT SURGIFOAM (HEMOSTASIS) ×3 IMPLANT
HEMOSTAT SNOW SURGICEL 2X4 (HEMOSTASIS) IMPLANT
INSERT FOGARTY 61MM (MISCELLANEOUS) IMPLANT
INSERT FOGARTY SM (MISCELLANEOUS) IMPLANT
KIT BASIN OR (CUSTOM PROCEDURE TRAY) ×6 IMPLANT
KIT INFUSE X SMALL 1.4CC (Orthopedic Implant) ×3 IMPLANT
KIT OSTEOCOOL BONE ACCESS 8G (MISCELLANEOUS) ×3 IMPLANT
KIT TURNOVER KIT B (KITS) ×6 IMPLANT
KNIFE SURG 188MM BAYONET LONG (INSTRUMENTS) ×3 IMPLANT
LIGHT SOURCE STRAIGHT TIP (INSTRUMENTS) ×3 IMPLANT
LOOP VESSEL MAXI BLUE (MISCELLANEOUS) IMPLANT
LOOP VESSEL MINI RED (MISCELLANEOUS) IMPLANT
Medtronic Screw Extenders ×16 IMPLANT
NEEDLE BEVEL TWO-PAK W/1PK (NEEDLE) ×3 IMPLANT
NEEDLE HYPO 18GX1.5 BLUNT FILL (NEEDLE) IMPLANT
NEEDLE HYPO 21X1.5 SAFETY (NEEDLE) ×3 IMPLANT
NEEDLE HYPO 22GX1.5 SAFETY (NEEDLE) ×6 IMPLANT
NEEDLE SPNL 18GX3.5 QUINCKE PK (NEEDLE) ×3 IMPLANT
NEEDLE TROCAR PAK (NEEDLE) ×3 IMPLANT
NS IRRIG 1000ML POUR BTL (IV SOLUTION) ×3 IMPLANT
PACK LAMINECTOMY NEURO (CUSTOM PROCEDURE TRAY) ×6 IMPLANT
PAD ARMBOARD 7.5X6 YLW CONV (MISCELLANEOUS) ×15 IMPLANT
PUTTY DBF 6CC CORTICAL FIBERS (Putty) ×3 IMPLANT
ROD 5.5 CCM PERC 40 (Rod) ×3 IMPLANT
ROD 5.5X45MM SOLERA VOYAGER (Rod) ×3 IMPLANT
SCREW MAS VOYAGER 6.5X35 (Screw) ×3 IMPLANT
SCREW MAS VOYAGER 6.5X40 (Screw) ×3 IMPLANT
SCREW SET 5.5/6.0MM SOLERA (Screw) ×12 IMPLANT
SCREW SPINAL IFIX 6.5X45 (Screw) ×6 IMPLANT
SPACER OLIF 20X10X50 6D (Spacer) ×3 IMPLANT
SPONGE INTESTINAL PEANUT (DISPOSABLE) IMPLANT
SPONGE LAP 18X18 RF (DISPOSABLE) ×3 IMPLANT
SPONGE LAP 4X18 RFD (DISPOSABLE) IMPLANT
SPONGE SURGIFOAM ABS GEL SZ50 (HEMOSTASIS) IMPLANT
STAPLER VISISTAT 35W (STAPLE) ×3 IMPLANT
STRIP CLOSURE SKIN 1/2X4 (GAUZE/BANDAGES/DRESSINGS) IMPLANT
SUT MNCRL AB 4-0 PS2 18 (SUTURE) ×6 IMPLANT
SUT PDS AB 1 CTX 36 (SUTURE) ×3 IMPLANT
SUT PROLENE 4 0 RB 1 (SUTURE)
SUT PROLENE 4-0 RB1 .5 CRCL 36 (SUTURE) IMPLANT
SUT PROLENE 5 0 CC1 (SUTURE) IMPLANT
SUT PROLENE 6 0 C 1 30 (SUTURE) IMPLANT
SUT PROLENE 6 0 CC (SUTURE) IMPLANT
SUT SILK 0 TIES 10X30 (SUTURE) ×3 IMPLANT
SUT SILK 2 0 TIES 10X30 (SUTURE) IMPLANT
SUT SILK 2 0 TIES 17X18 (SUTURE) ×2
SUT SILK 2 0SH CR/8 30 (SUTURE) IMPLANT
SUT SILK 2-0 18XBRD TIE BLK (SUTURE) ×1 IMPLANT
SUT SILK 3 0 TIES 17X18 (SUTURE)
SUT SILK 3 0SH CR/8 30 (SUTURE) IMPLANT
SUT SILK 3-0 18XBRD TIE BLK (SUTURE) IMPLANT
SUT VIC AB 0 CT1 18XCR BRD8 (SUTURE) ×2 IMPLANT
SUT VIC AB 0 CT1 8-18 (SUTURE) ×4
SUT VIC AB 2-0 CP2 18 (SUTURE) ×15 IMPLANT
SUT VICRYL 4-0 PS2 18IN ABS (SUTURE) IMPLANT
SYR 20ML LL LF (SYRINGE) ×3 IMPLANT
TOWEL GREEN STERILE (TOWEL DISPOSABLE) ×9 IMPLANT
TOWEL GREEN STERILE FF (TOWEL DISPOSABLE) ×6 IMPLANT
TRAY FOLEY MTR SLVR 16FR STAT (SET/KITS/TRAYS/PACK) ×3 IMPLANT
WATER STERILE IRR 1000ML POUR (IV SOLUTION) ×3 IMPLANT

## 2021-01-22 NOTE — Op Note (Signed)
Date: January 22, 2021  Preoperative diagnosis: Degenerative disc disease with chronic lower back pain  Postoperative diagnosis: Same  Procedure: Oblique abdominal exposure at the L4-L5 disc space for L4-L5 OLIF  Surgeon: Dr. Marty Heck, MD  Co-surgeon: Dr. Duffy Rhody, MD  Assistant: Dr. Marjean Donna, MD  Indications: Patient is a 55 year old female who was seen with chronic lower back pain who presents for oblique abdominal exposure at the L4-L5 disc space for L4-L5 OLIF with Dr. Marcello Moores.  Vascular surgery was asked to assist with abdominal exposure.  Risk benefits discussed  Findings: Patient was morbidly obese which made exposure challenging.  Ultimately incision was made over the left oblique muscles with the patient in the right lateral position.  Ultimately after entering the retroperitoneal space peritoneum and left ureter was mobilized bluntly to expose the left psoas muscle and left iliac vessels were exposed.  Ultimately I mobilized the left iliac vessels medial to the disc space and mobilized the psoas muscle lateral to expose the L4-L5 disc space.  Fixed Medtronic OLIF retractor was placed with three 200 cm arms and we confirmed we were at the correct level.  Anesthesia: General  Details: Patient was taken to the operating room after informed consent was obtained.  Placed on operative table supine position and general endotracheal anesthesia was induced.  When she was asleep she was then placed in the right lateral position and pressure points were padded.  She was taped down by Dr. Marcello Moores.  We then brought a fluoroscopic C arm and in the lateral position to mark the L4-L5 disc base over the left oblique muscles.  I then prepped and draped the abdominal wall in standard sterile fashion.  Timeout was performed.  I made a incision over our preoperative mark over the left oblique muscles.  Dissected through the subcutaneous tissue with Bovie cautery until I encountered the  oblique muscles where we used cerebellar retractors for added visualization.  I then performed muscle-sparing to open the oblique muscles and identified the peritoneum and entered the retroperitoneum.  I had to place a Balfour retractor for added visualization in the abdominal incision given her morbid obesity.  I then was able to gently sweep with my finger bluntly and get the peritoneum and intestines to roll away to the right of the disc space and expose the left psoas and iliac vessels.  I then used hand-held Wiley retractors for added visualization and ultimately 200 cm fixed retractors were placed to mobilize the vessels to the right of the disc space with the fixed Medtronic OLIF retractors as the patient was in the right lateral position.  I then used blunt peanut and mobilized the psoas muscle lateral to the disc space.  I then placed two additional 200 cm retractors on the left side of the space that were fixed in place with the OLIF retractor.  We put a spinal needle in the disc space and confirmed we were at the correct level.  Case turned over to Dr. Marcello Moores   Complication: None  Condition: Stable  Marty Heck, MD [facility] Office: [place]

## 2021-01-22 NOTE — H&P (Signed)
History and Physical Interval Note:  01/22/2021 8:37 AM  Sarah Randall  has presented today for surgery, with the diagnosis of SPONDYLOLISTHESIS AT LUMBAR 4- LUMBAR 5 LEVEL.  The various methods of treatment have been discussed with the patient and family. After consideration of risks, benefits and other options for treatment, the patient has consented to  Procedure(s): OLIF L4-L5 (N/A) LUMBAR PERCUTANEOUS PEDICLE SCREW 1 LEVEL (N/A) ABDOMINAL EXPOSURE (N/A) as a surgical intervention.  The patient's history has been reviewed, patient examined, no change in status, stable for surgery.  I have reviewed the patient's chart and labs.  Questions were answered to the patient's satisfaction.    L4-L5 OLIF.  Risks and benefits discussed.  Discussed some risk given her morbid obesity and BMI 49 may have difficult exposing disc space.  Marty Heck  Patient name: Sarah Randall           MRN: [id number]        DOB: Mar 24, 1966        Sex: female  REASON FOR CONSULT: Evaluate for L4-L5 OLIF  HPI: [name] is a 55 y.o. female, with history of hypertension, hyperlipidemia, tobacco abuse, prediabetes, chronic back pain who presents for evaluation of a L4-L5 OLIF.  Patient describes several years of back pain with bilateral lower extremity radiculopathy.  She has had nonsurgical therapy that has ultimately failed and she has had continued progression of her pain that is limiting her quality of life.  She has been seen by Dr. Marcello Moores with [place] neuro[facility] who was ultimately recommended an L4-L5 OLIF with additional percutaneous posterior instrumentation.  Vascular surgery was asked to evaluate for L4-L5 OLIF abdominal exposure.  Previous abdominal surgery includes previous tubal ligation and a robotic assisted hysterectomy.  She does smoke about 4 cigarettes a day.      Past Medical History:  Diagnosis Date  . Anemia   . Anxiety   . Arthritis   . B12 deficiency 06/30/2019  .  Depression   . GERD (gastroesophageal reflux disease)    occasionally takes OTC  . Hyperlipidemia   . Hypertension   . Low back pain   . Obesity   . Pain in right buttock    Goes down to Right leg to lateral aspect of calf.  . Pre-diabetes   . Sleep apnea    tested 2014 - unable to tolerate machine  . Spondylolisthesis at L4-L5 level          Past Surgical History:  Procedure Laterality Date  . LEFT HEART CATH AND CORONARY ANGIOGRAPHY N/A 10/25/2018   Procedure: LEFT HEART CATH AND CORONARY ANGIOGRAPHY;  Surgeon: Martinique, Peter M, MD;  Location: [place] CV LAB;  Service: Cardiovascular;  Laterality: N/A;  . ROBOTIC ASSISTED LAPAROSCOPIC HYSTERECTOMY AND SALPINGECTOMY Bilateral 12/27/2019   Procedure: XI ROBOTIC ASSISTED TOTAL LAPAROSCOPIC HYSTERECTOMY AND SALPINGECTOMY;  Surgeon: Princess Bruins, MD;  Location: [place];  Service: Gynecology;  Laterality: Bilateral;  . TOTAL HIP ARTHROPLASTY Left 09/22/2017   Procedure: LEFT TOTAL HIP ARTHROPLASTY ANTERIOR APPROACH;  Surgeon: Leandrew Koyanagi, MD;  Location: [place];  Service: Orthopedics;  Laterality: Left;  . TOTAL KNEE ARTHROPLASTY Right 01/12/2018   Procedure: RIGHT TOTAL KNEE ARTHROPLASTY;  Surgeon: Leandrew Koyanagi, MD;  Location: [place];  Service: Orthopedics;  Laterality: Right;  . TUBAL LIGATION           Family History  Problem Relation Age of Onset  . Hypertension Mother   . Diabetes  Mother   . Breast cancer Mother 20  . Hypertension Father   . Lung cancer Maternal Aunt   . Colon polyps Brother   . Colon cancer Neg Hx   . Esophageal cancer Neg Hx   . Rectal cancer Neg Hx   . Stomach cancer Neg Hx     SOCIAL HISTORY: Social History        Socioeconomic History  . Marital status: Single    Spouse name: Not on file  . Number of children: 2  . Years of education: 9  . Highest education level: Not on file  Occupational History  . Occupation: unemployed   Tobacco Use  . Smoking status: Current Every Day Smoker    Packs/day: 0.25    Years: 30.00    Pack years: 7.50    Types: Cigarettes  . Smokeless tobacco: Never Used  . Tobacco comment: Smokes 4 cigarettes per day  Vaping Use  . Vaping Use: Never used  Substance and Sexual Activity  . Alcohol use: Yes    Alcohol/week: 4.0 - 6.0 standard drinks    Types: 4 - 6 Shots of liquor per week    Comment: occasional  . Drug use: Yes    Types: Marijuana    Comment: smoke 1 joint cannibis 1 week ago.  Marland Kitchen Sexual activity: Yes    Birth control/protection: None    Comment: declined insurance questions,des neg  Other Topics Concern  . Not on file  Social History Narrative  . Not on file   Social Determinants of Health   Financial Resource Strain: Not on file  Food Insecurity: Not on file  Transportation Needs: Not on file  Physical Activity: Not on file  Stress: Not on file  Social Connections: Not on file  Intimate Partner Violence: Not on file         Allergies  Allergen Reactions  . Pregabalin Swelling  . Gabapentin Swelling  . Lipitor [Atorvastatin] Other (See Comments)    Stabbing pains in legs  . Pravachol [Pravastatin]     Sharp pains in LT leg  . Zoloft [Sertraline Hcl] Other (See Comments)    tremors          Current Outpatient Medications  Medication Sig Dispense Refill  . amitriptyline (ELAVIL) 25 MG tablet Take 1 tablet (25 mg total) by mouth at bedtime. 30 tablet 2  . diclofenac (CATAFLAM) 50 MG tablet Take 50 mg by mouth 2 (two) times daily.    Marland Kitchen esomeprazole (NEXIUM) 40 MG capsule TAKE 1 CAPSULE BY MOUTH EVERY DAY 90 capsule 1  . hydrochlorothiazide (HYDRODIURIL) 25 MG tablet Take 1 tablet (25 mg total) by mouth daily. 30 tablet 4  . loratadine (CLARITIN) 10 MG tablet TAKE 1 TABLET BY MOUTH EVERY DAY 30 tablet 2  . losartan (COZAAR) 50 MG tablet TAKE 1 TABLET BY MOUTH EVERY DAY 90 tablet 0  . methocarbamol (ROBAXIN) 500 MG  tablet Take 2 tablets (1,000 mg total) by mouth every 8 (eight) hours as needed for muscle spasms. 180 tablet 2  . propranolol (INDERAL) 10 MG tablet Take 1 tablet (10 mg total) by mouth 2 (two) times daily. 180 tablet 0  . DULoxetine (CYMBALTA) 30 MG capsule Take 1 capsule (30 mg total) by mouth daily. (Patient not taking: Reported on 11/26/2020) 30 capsule 2  . nitroGLYCERIN (NITROSTAT) 0.4 MG SL tablet Place 1 tablet (0.4 mg total) under the tongue every 5 (five) minutes as needed. 25 tablet 11   No current  facility-administered medications for this visit.    REVIEW OF SYSTEMS:  [X]  denotes positive finding, [ ]  denotes negative finding Cardiac  Comments:  Chest pain or chest pressure:    Shortness of breath upon exertion:    Short of breath when lying flat:    Irregular heart rhythm:        Vascular    Pain in calf, thigh, or hip brought on by ambulation:    Pain in feet at night that wakes you up from your sleep:     Blood clot in your veins:    Leg swelling:         Pulmonary    Oxygen at home:    Productive cough:     Wheezing:         Neurologic    Sudden weakness in arms or legs:     Sudden numbness in arms or legs:     Sudden onset of difficulty speaking or slurred speech:    Temporary loss of vision in one eye:     Problems with dizziness:         Gastrointestinal    Blood in stool:     Vomited blood:         Genitourinary    Burning when urinating:     Blood in urine:        Psychiatric    Major depression:         Hematologic    Bleeding problems:    Problems with blood clotting too easily:        Skin    Rashes or ulcers:        Constitutional    Fever or chills:      PHYSICAL EXAM:    Vitals:   11/26/20 1442  BP: 110/84  Pulse: 86  Resp: 20  Temp: 98 F (36.7 C)  SpO2: 96%  Weight: 286 lb (129.7 kg)  Height: 5\' 4"  (1.626 m)     GENERAL: The patient is a well-nourished female, in no acute distress. The vital signs are documented above. CARDIAC: There is a regular rate and rhythm.  VASCULAR:  1+ palpable femoral pulses bilaterally 1+ palpable dorsalis pedis pulses bilaterally PULMONARY: There is good air exchange bilaterally without wheezing or rales. ABDOMEN: Soft and non-tender. Obese. MUSCULOSKELETAL: There are no major deformities or cyanosis. NEUROLOGIC: No focal weakness or paresthesias are detected. SKIN: There are no ulcers or rashes noted. PSYCHIATRIC: The patient has a normal affect.  DATA:   I independently reviewed her MRI lumbar spine on 12/21/2019 and reviewed her anatomy and the the L4-L5 disc space.  Assessment/Plan:  55 year old female presents for evaluation of L4-L5 OLIF.  I discussed with the patient steps of surgery including a right lateral positioning in the OR and incision over her left oblique muscles laterally where the L4-L5 disc space has been marked.  I talked about basically moving the left ureter and intestitnes to expose the L4-L5 disc space from oblique approach and this would likely require mobilizing the psoas muscle lateral as well as iliac vein and artery medial to get to the disc space.  Risk and benefits were discussed including injury to the iliac artery and vein as well as other structures that require mobilizing.  This will be more challenging given she has a BMI of 49 and is morbidly obese.   Marty Heck, MD [facility] [place] Office: [phone]

## 2021-01-22 NOTE — Progress Notes (Signed)
Pt repositioned and drainage was noted from bilateral lower back incisions.  Gauze was placed over skin glue and secured with island tape.

## 2021-01-22 NOTE — Anesthesia Procedure Notes (Signed)
Procedure Name: Intubation Date/Time: 01/22/2021 8:59 AM Performed by: Inda Coke, CRNA Pre-anesthesia Checklist: Patient identified, Emergency Drugs available, Suction available and Patient being monitored Patient Re-evaluated:Patient Re-evaluated prior to induction Oxygen Delivery Method: Circle System Utilized Preoxygenation: Pre-oxygenation with 100% oxygen Induction Type: IV induction Ventilation: Mask ventilation without difficulty Laryngoscope Size: Mac and 3 Grade View: Grade I Tube type: Oral Tube size: 7.0 mm Number of attempts: 1 Airway Equipment and Method: Stylet and Oral airway Placement Confirmation: ETT inserted through vocal cords under direct vision,  positive ETCO2 and breath sounds checked- equal and bilateral Secured at: 22 cm Tube secured with: Tape Dental Injury: Teeth and Oropharynx as per pre-operative assessment

## 2021-01-22 NOTE — Anesthesia Procedure Notes (Signed)
Arterial Line Insertion Start/End2/16/2022 8:59 AM, 01/22/2021 9:04 AM Performed by: Catalina Gravel, MD, anesthesiologist  Patient location: OR. Preanesthetic checklist: patient identified, IV checked, site marked, risks and benefits discussed, surgical consent, monitors and equipment checked, pre-op evaluation, timeout performed and anesthesia consent Lidocaine 1% used for infiltration Right, radial was placed Catheter size: 20 Fr Hand hygiene performed  and maximum sterile barriers used   Attempts: 1 Procedure performed without using ultrasound guided technique. Following insertion, dressing applied and Biopatch. Post procedure assessment: normal and unchanged  Patient tolerated the procedure well with no immediate complications.

## 2021-01-22 NOTE — H&P (Signed)
CC: back pain  HPI:     Patient is a 55 y.o. female presented to the clinic for worsening mechanical back pain with bilateral leg pains and numbness.  She was found to have L4-5 degenerative spondylolisthesis with significant movement with flexion and extension.  Due to failure of nonsurgical therapies, surgical options were discussed with her. She is here for elective L4-5 OLIF.    Patient Active Problem List   Diagnosis Date Noted  . Positive ANA (antinuclear antibody) 12/13/2020  . Trigger finger of left thumb 12/10/2020  . Lumbar radiculopathy 11/07/2020  . Chronic right-sided low back pain with right-sided sciatica 10/10/2020  . Sleep disturbance 10/10/2020  . Myalgia 10/10/2020  . Super obese 10/10/2020  . Abnormality of gait 10/10/2020  . Statin intolerance 09/17/2020  . Recurrent boils 09/17/2020  . Chronic bilateral low back pain 05/16/2020  . Postoperative state 12/27/2019  . Iron deficiency 08/03/2019  . B12 deficiency 06/30/2019  . Chronic right shoulder pain 07/05/2018  . Trochanteric bursitis of left hip 07/05/2018  . Morbid obesity ([place]) 07/05/2018  . Body mass index 45.0-49.9, adult ([place]) 07/05/2018  . Marijuana user 05/26/2018  . Status post total right knee replacement 01/12/2018  . Status post total hip replacement, left 09/22/2017  . Hyperlipidemia 08/10/2017  . Mild depression ([place]) 06/25/2017  . Essential hypertension 06/25/2017  . Tobacco abuse 06/25/2017  . Prediabetes 06/25/2017  . OSA on CPAP 06/25/2017  . Bronchitis 09/24/2016  . Leukocytosis 09/24/2016  . Radiculopathy, lumbar region 07/16/2016  . Degenerative lumbar disc 07/03/2016  . Synovitis of hip 04/20/2016  . Screening for breast cancer 12/28/2014  . Pain in joint involving lower leg 05/13/2013   Past Medical History:  Diagnosis Date  . Anemia   . Anxiety   . Arthritis   . B12 deficiency 06/30/2019  . Depression   . GERD (gastroesophageal reflux disease)    occasionally takes  OTC  . History of leukocytosis   . Hyperlipidemia   . Hypertension   . Low back pain   . Obesity   . Pain in right buttock    Goes down to Right leg to lateral aspect of calf.  . Pre-diabetes   . Sleep apnea    tested 2014 - unable to tolerate machine  . Spondylolisthesis at L4-L5 level     Past Surgical History:  Procedure Laterality Date  . ABDOMINAL HYSTERECTOMY    . LEFT HEART CATH AND CORONARY ANGIOGRAPHY N/A 10/25/2018   Procedure: LEFT HEART CATH AND CORONARY ANGIOGRAPHY;  Surgeon: Martinique, Peter M, MD;  Location: [facility];  Service: Cardiovascular;  Laterality: N/A;  . ROBOTIC ASSISTED LAPAROSCOPIC HYSTERECTOMY AND SALPINGECTOMY Bilateral 12/27/2019   Procedure: XI ROBOTIC ASSISTED TOTAL LAPAROSCOPIC HYSTERECTOMY AND SALPINGECTOMY;  Surgeon: Princess Bruins, MD;  Location: [facility];  Service: Gynecology;  Laterality: Bilateral;  . TOTAL HIP ARTHROPLASTY Left 09/22/2017   Procedure: LEFT TOTAL HIP ARTHROPLASTY ANTERIOR APPROACH;  Surgeon: Leandrew Koyanagi, MD;  Location: [facility];  Service: Orthopedics;  Laterality: Left;  . TOTAL KNEE ARTHROPLASTY Right 01/12/2018   Procedure: RIGHT TOTAL KNEE ARTHROPLASTY;  Surgeon: Leandrew Koyanagi, MD;  Location: [facility];  Service: Orthopedics;  Laterality: Right;  . TUBAL LIGATION      Medications Prior to Admission  Medication Sig Dispense Refill Last Dose  . esomeprazole (NEXIUM) 40 MG capsule TAKE 1 CAPSULE BY MOUTH EVERY DAY 90 capsule 1 01/22/2021 at 0515  . hydrochlorothiazide (HYDRODIURIL) 25 MG tablet TAKE 1 TABLET  BY MOUTH EVERY DAY 90 tablet 0 01/21/2021 at Unknown time  . losartan (COZAAR) 50 MG tablet TAKE 1 TABLET BY MOUTH EVERY DAY (Patient taking differently: Take 50 mg by mouth daily.) 90 tablet 0 01/21/2021 at Unknown time  . methocarbamol (ROBAXIN) 500 MG tablet Take 2 tablets (1,000 mg total) by mouth every 8 (eight) hours as needed for muscle spasms. 180 tablet 2 01/21/2021 at Unknown time  . nitroGLYCERIN  (NITROSTAT) 0.4 MG SL tablet Place 1 tablet (0.4 mg total) under the tongue every 5 (five) minutes as needed. 25 tablet 11   . propranolol (INDERAL) 10 MG tablet Take 1 tablet (10 mg total) by mouth 2 (two) times daily. (Patient taking differently: Take 10 mg by mouth daily.) 180 tablet 0 01/22/2021 at 0515  . amitriptyline (ELAVIL) 25 MG tablet Take 1 tablet (25 mg total) by mouth at bedtime. 30 tablet 2 Unknown at Unknown time  . DULoxetine (CYMBALTA) 30 MG capsule Take 1 capsule (30 mg total) by mouth daily. (Patient not taking: No sig reported) 30 capsule 2 Not Taking at Unknown time   Allergies  Allergen Reactions  . Pregabalin Swelling  . Cymbalta [Duloxetine Hcl] Nausea Only  . Gabapentin Swelling  . Lipitor [Atorvastatin] Other (See Comments)    Stabbing pains in legs  . Pravachol [Pravastatin]     Sharp pains in LT leg  . Zoloft [Sertraline Hcl] Other (See Comments)    tremors    Social History   Tobacco Use  . Smoking status: Current Every Day Smoker    Packs/day: 0.25    Years: 30.00    Pack years: 7.50    Types: Cigarettes  . Smokeless tobacco: Never Used  . Tobacco comment: Smokes 2 cigarettes per day  Substance Use Topics  . Alcohol use: Yes    Alcohol/week: 4.0 - 6.0 standard drinks    Types: 4 - 6 Shots of liquor per week    Comment: occ    Family History  Problem Relation Age of Onset  . Hypertension Mother   . Diabetes Mother   . Breast cancer Mother 59  . Hypertension Father   . Lung cancer Maternal Aunt   . Colon polyps Brother   . Colon cancer Neg Hx   . Esophageal cancer Neg Hx   . Rectal cancer Neg Hx   . Stomach cancer Neg Hx      Review of Systems Pertinent items noted in HPI and remainder of comprehensive ROS otherwise negative.  Objective:   Patient Vitals for the past 8 hrs:  BP Temp Temp src Pulse Resp SpO2 Height Weight  01/22/21 0640 (!) 138/92 98.3 F (36.8 C) Oral 79 18 98 % 5\' 4"  (1.626 m) 129.7 kg   No intake/output data  recorded. No intake/output data recorded.      General : Alert, cooperative, no distress, appears stated age. Morbidly obese.   Head:  Normocephalic/atraumatic    Eyes: PERRL, conjunctiva/corneas clear, EOM's intact. Fundi could not be visualized Neck: Supple Chest:  Respirations unlabored Chest wall: no tenderness or deformity Heart: Regular rate and rhythm Abdomen: Soft, nontender and nondistended Extremities: warm and well-perfused Skin: normal turgor, color and texture Neurologic:  Alert, oriented x 3.  Eyes open spontaneously. PERRL, EOMI, VFC, no facial droop. V1-3 intact.  No dysarthria, tongue protrusion symmetric.  CNII-XII intact. Normal strength, sensation and reflexes throughout.  No pronator drift, full strength in legs       Data Review CBC:  Lab Results  Component Value Date   WBC 15.3 (H) 01/17/2021   RBC 4.69 01/17/2021   BMP:  Lab Results  Component Value Date   GLUCOSE 100 (H) 01/17/2021   CO2 26 01/17/2021   BUN 12 01/17/2021   BUN 12 06/20/2019   CREATININE 0.97 01/17/2021   CREATININE 0.94 06/15/2019   CALCIUM 9.0 01/17/2021   Coagulation:  Lab Results  Component Value Date   INR 0.97 01/05/2018   APTT 27 01/05/2018   Radiology review: L4-5 spondylolisthesis with dynamic instability, moderate foraminal stenosis.  Assessment:  54 yo F with morbid obesity who has L4-5 spondylolisthesis with dynamic instability  Plan:   - plan for L4-5 OLIF -  I discussed in detail the procedure with the patient.  Possibility of aborting the procedure if technically unfeasible given her body habitus was discussed. Risks, benefits, alternatives, and expected convalescence was discussed.  Risks discussed included but were not limited to bleeding, pain, infection,  scar, pseudoarthrosis, adjacent segment disease, injury to nearby organs, cerebrospinal fluid leak, neurologic deficit, paralysis, coma and death.  I also discussed with her the possibility of blood  transfusion during surgery and consent to transfuse blood products if necessary was also obtained.  All questions and concerns were answered and understanding and agreement with the plan was verbalized.

## 2021-01-22 NOTE — Progress Notes (Signed)
Orthopedic Tech Progress Note Patient Details:  Sarah Randall 21-Sep-1966 [id number] Aspen Lumbar (LSO) Brace has been sized and given to patient in PACU  Patient ID: Sarah Randall, female   DOB: 08/27/1966, 55 y.o.   MRN: [id number]   [name] 01/22/2021, 7:46 PM

## 2021-01-22 NOTE — Op Note (Addendum)
Procedure(s): Oblique Lateral Interbody Fusion Lumbar Four-Lumbar Five LUMBAR PERCUTANEOUS PEDICLE SCREW LUBAR FOUR- FIVE LATERAL EXPOSURE FOR OBLIQUE LATERAL INTERBODY FUSION Procedure Note  Sarah Randall female 55 y.o. 01/22/2021  Procedure(s) and Anesthesia Type: 1. Oblique anterior lumbar interbody fusion at L4-5 via retroperitoneal approach 2. Placement of interbody cage at L4-5 3. Nonsegmental instrumentation L4-5 with posterior percutaneous pedicle screws and rods 4. Use of morselized allograft   Surgeon(s) and Role: Panel 1:    Marcello Moores, Dorcas Carrow, MD - Primary    * Zada Finders Joyice Faster, MD - Assisting Panel 2:    Marty Heck, MD - Primary    * Cherre Robins, MD - Assisting   Indications: This is a 55 year old woman with morbid obesity and diabetes type 2 who presented to clinic for severe back pain and bilateral leg pains.  She was found to have significant degenerative disc disease with acute Modic changes at L4-5 with spondylolisthesis that was worse with bending and standing.  Her back pain was mechanical in nature.  She tried numerous nonsurgical therapies including medication management with gabapentin, Lyrica, and duloxetine and amitriptyline, physical therapy as well as   numerous injections.  However her pain appears to be getting worse.  After long discussion, I discussed with her the option of L4-5 interbody fusion.  Given her body habitus, I felt that a anterolateral approach with posterior percutaneous supplemented instrumentation would give the best chance of fusion.  Risks, benefits, alternatives, expected convalescence were discussed with her.  Risk discussed included, but not limited to, bleeding, pain, infection, scar, pseudoarthrosis, adjacent segment degeneration, neurologic deficit, injury to nearby organs, coma, and death.  Informed consent was obtained and she wished to proceed.  Surgeon: Vallarie Mare   Assistants: Emelda Brothers,  MD  Anesthesia: GETA  Procedure Detail  Oblique Lateral Interbody Fusion Lumbar Four-Lumbar Five, LUMBAR PERCUTANEOUS PEDICLE SCREW LUBAR FOUR- FIVE  The patient was brought to the operating room.  General anesthesia was induced and patient was intubated by the anesthesia service.  After appropriate lines and monitors were placed, patient positioned in lateral decubitus position with the left side up.  She was secured to the bed with tape and x-rays were used to confirm perfect lateral and AP projections.  The L4-5 disc space was marked on the skin and a oblique incision anterior to the iliac crest was planned.  The area was preprepped with alcohol and prepped and draped in sterile fashion.  Dr. Carlis Abbott then performed the approach which involved splitting the lateral abdominal musculature and retroperitoneal dissection to the L4-5 disc space.  He placed retractors over the vascular structures and the psoas as well.  With the displaced well exposed, an annulotomy was made with a 15 blade.  Discectomy was then performed with rongeurs and curettes.  Contralateral annulus was then released with use of an angled Cobb instrument.  Orthogonal maneuver was used with the help of lateral and AP x-ray projections to ensure good release of the contralateral annulus.  Angled box cutter was then used to remove the remaining disc space, again using AP and lateral C arm to perform this maneuver across the disc space.  The endplates were then prepared with rasps and a trial was placed using C arm guidance.  A size 10 mm x 50 mm implant with 6 degrees lordosis was selected.  This was packed with extra small BMP and DBF.  Using x-ray, this to fix interbody implant was placed in the  interbody space and orthogonal maneuver was used to place the implant directly across the disc space until it was spanning both apophyseal rings.  Meticulous hemostasis was obtained and the retractors were removed.  The external oblique fascia was  closed with 0 Vicryl stitches.  The fat layer was closed with 0 Vicryl stitches.  The dermal layer was closed with 2-0 Vicryl stitches in buried interrupted fashion.  The skin was closed with 4-0 Monocryl subcuticular manner followed by Dermabond.  Patient was then flipped prone on a Jackson table.  Her lower back was preprepped with alcohol prepped and draped in sterile fashion.  A second timeout was performed.  The lateral borders of the L4 and L5 pedicles were marked on the skin and incision was made with a 10 blade approximately 2 fingerbreadths lateral to these areas.  Jamshidi was then placed under AP C-arm guidance over the lateral aspect of the pedicle and then malleted through the pedicle into the body using C arm.  AP and lateral projections confirmed good cannulation.  K wires were then used to replace the Jamshidi's and the screw holes were then tapped and 6.5 millimeters screws were then placed over the K wire under C-arm guidance.  The left L5 screw appeared to have a lateral breach.  It was removed and a more medially starting path was cannulated with Jamshidi's, followed by K wire, tap and screw.  The screw appeared to have better purchase.  AP and lateral C arm confirmed good position with no pedicle breach seen.  40 and 45 mm rods were then passed through the screw towers bilaterally and screw caps were placed.  After x-ray confirmed good placement of the rods, they were final tightened and the screw towers were removed.  The 2 paramedian incisions were then irrigated and the deep layer was closed with 0 Vicryl stitches.  The dermal layer was closed with 2-0 Vicryl stitches.  The skin was closed with 4 Monocryl in subcuticular manner followed by Dermabond.  Patient was then flipped supine and extubated by the anesthesia service.  Given the patient's morbid obesity, there was significant amount of increased work to perform the surgery, as most of the instruments were not long enough to be utilized  for the surgery.  Findings: Successful interbody fusion  Estimated Blood Loss:  300 mL         Drains: none         Blood Given: none          Specimens: none         Implants:  [employer]  10 x 50 x 20 mm Pivox cage with 6 degrees lordosis 6.5x45 mm pedicle screw at L L4, 6.5 x 40 mm at R L4, 6.5 x 35 mm screw at L L5, 6.5 x 45 mm screw at R L5 40 mm and 45 mm rods        Complications:  * No complications entered in OR log *         Disposition: PACU - hemodynamically stable.         Condition: stable

## 2021-01-22 NOTE — Progress Notes (Signed)
Received report from Chip in PACU; pt arrived to floor in wheelchair; pt was a 2+max assist to the bed; pt c/o pain 10/10.  Abdominal incision was reinforced with gauze due to bloody drainage.  Pt was oriented to room and unit; food tray was given to patient and pt set up and eating; call bell within reach and bed in lowest position.  Chip stated that patient was having left leg weakness after surgery and surgeon notified and aware; no new order for this issue.

## 2021-01-22 NOTE — Transfer of Care (Signed)
Immediate Anesthesia Transfer of Care Note  Patient: Sarah Randall  Procedure(s) Performed: Oblique Lateral Interbody Fusion Lumbar Four-Lumbar Five (N/A ) LUMBAR PERCUTANEOUS PEDICLE SCREW LUBAR FOUR- FIVE (N/A ) LATERAL EXPOSURE FOR OBLIQUE LATERAL INTERBODY FUSION (N/A )  Patient Location: PACU  Anesthesia Type:General  Level of Consciousness: awake, alert  and oriented  Airway & Oxygen Therapy: Patient Spontanous Breathing and Patient connected to face mask oxygen  Post-op Assessment: Report given to RN and Post -op Vital signs reviewed and stable  Post vital signs: Reviewed and stable  Last Vitals:  Vitals Value Taken Time  BP 127/64 01/22/21 1802  Temp 36.1 C 01/22/21 1800  Pulse 98 01/22/21 1813  Resp 17 01/22/21 1813  SpO2 96 % 01/22/21 1813  Vitals shown include unvalidated device data.  Last Pain:  Vitals:   01/22/21 1700  TempSrc:   PainSc: 10-Worst pain ever         Complications: No complications documented.

## 2021-01-22 NOTE — Anesthesia Procedure Notes (Signed)
Arterial Line Insertion Start/End2/16/2022 9:00 AM, 01/22/2021 9:02 AM Performed by: Kyung Rudd, CRNA, CRNA  Preanesthetic checklist: patient identified, IV checked, site marked, risks and benefits discussed, surgical consent, monitors and equipment checked, pre-op evaluation, timeout performed and anesthesia consent Patient sedated Right, radial was placed Catheter size: 20 G Hand hygiene performed  and maximum sterile barriers used  Allen's test indicative of satisfactory collateral circulation Attempts: 3 Procedure performed using ultrasound guided technique. Ultrasound Notes:anatomy identified, needle tip was noted to be adjacent to the nerve/plexus identified and no ultrasound evidence of intravascular and/or intraneural injection Following insertion, dressing applied and Biopatch. Post procedure assessment: normal and unchanged  Patient tolerated the procedure well with no immediate complications.

## 2021-01-23 MED ORDER — OXYCODONE-ACETAMINOPHEN 5-325 MG PO TABS: 1.0000 | ORAL_TABLET | Freq: Four times a day (QID) | ORAL | 0 refills | Status: DC | PRN

## 2021-01-23 MED ORDER — DOCUSATE SODIUM 100 MG PO CAPS: 100.0000 mg | ORAL_CAPSULE | Freq: Two times a day (BID) | ORAL | 0 refills | Status: DC

## 2021-01-23 MED ORDER — METHOCARBAMOL 500 MG PO TABS: 1000.0000 mg | ORAL_TABLET | Freq: Three times a day (TID) | ORAL | 0 refills | Status: DC | PRN

## 2021-01-23 MED ORDER — CEPHALEXIN 500 MG PO CAPS
500.0000 mg | ORAL_CAPSULE | Freq: Once | ORAL | Status: AC
  Administered 2021-01-23: 500 mg via ORAL
  Filled 2021-01-23: qty 1

## 2021-01-23 NOTE — Progress Notes (Signed)
Patient is discharged from room 3C03 at this time. Alert and in stable condition. IV site d/c'd and instructions read to patient and spouse with understanding verbalized and all questions answered. Left unit via wheelchair with all belongings at side. ?

## 2021-01-23 NOTE — Anesthesia Postprocedure Evaluation (Signed)
Anesthesia Post Note  Patient: Sarah Randall  Procedure(s) Performed: Oblique Lateral Interbody Fusion Lumbar Four-Lumbar Five (N/A ) LUMBAR PERCUTANEOUS PEDICLE SCREW LUBAR FOUR- FIVE (N/A ) LATERAL EXPOSURE FOR OBLIQUE LATERAL INTERBODY FUSION (N/A )     Patient location during evaluation: PACU Anesthesia Type: General Level of consciousness: awake and alert Pain management: pain level controlled Vital Signs Assessment: post-procedure vital signs reviewed and stable Respiratory status: spontaneous breathing, nonlabored ventilation, respiratory function stable and patient connected to nasal cannula oxygen Cardiovascular status: blood pressure returned to baseline and stable Postop Assessment: no apparent nausea or vomiting Anesthetic complications: no   No complications documented.  Last Vitals:  Vitals:   01/23/21 0738 01/23/21 1206  BP: (!) 114/104 106/76  Pulse: 81 80  Resp: 18 18  Temp: 36.6 C 36.8 C  SpO2: 100% 100%    Last Pain:  Vitals:   01/23/21 1206  TempSrc: Oral  PainSc:                  Catalina Gravel

## 2021-01-23 NOTE — Discharge Summary (Signed)
  Physician Discharge Summary  Patient ID: [name] MRN: [id number] DOB/AGE: [birth year] 55 y.o.  Admit date: 01/22/2021 Discharge date: 01/23/2021  Admission Diagnoses:  Lumbar radiculopathy  Discharge Diagnoses:  Same Active Problems:   Lumbar radiculopathy   Discharged Condition: Stable  Hospital Course:  MELENDA Randall is a 55 y.o. female who was admitted to the spine unit after elective L4-5 OLIF and posterior instrumentation.  On POD#1, her pain was well controlled, she was tolerating a regular diet, was passing gas.  She had mild left anterior thigh numbness as expected but full strength in her legs. She was deemed ready for d/c home.  Treatments: Surgery - L4-5 OLIF  Discharge Exam: Blood pressure 106/76, pulse 80, temperature 98.2 F (36.8 C), temperature source Oral, resp. rate 18, height 5\' 4"  (1.626 m), weight 129.7 kg, SpO2 100 %. Awake, alert, oriented Speech fluent, appropriate CN grossly intact 5/5 BUE/BLE Wound c/d/i  Disposition: Discharge disposition: 01-Home or Self Care       Discharge Instructions    Incentive spirometry RT   Complete by: As directed      Allergies as of 01/23/2021      Reactions   Pregabalin Swelling   Cymbalta [duloxetine Hcl] Nausea Only   Gabapentin Swelling   Lipitor [atorvastatin] Other (See Comments)   Stabbing pains in legs   Pravachol [pravastatin]    Sharp pains in LT leg   Zoloft [sertraline Hcl] Other (See Comments)   tremors      Medication List    TAKE these medications   amitriptyline 25 MG tablet Commonly known as: Elavil Take 1 tablet (25 mg total) by mouth at bedtime.   docusate sodium 100 MG capsule Commonly known as: COLACE Take 1 capsule (100 mg total) by mouth 2 (two) times daily.   DULoxetine 30 MG capsule Commonly known as: CYMBALTA Take 1 capsule (30 mg total) by mouth daily.   esomeprazole 40 MG capsule Commonly known as: NEXIUM TAKE 1 CAPSULE BY MOUTH EVERY DAY    hydrochlorothiazide 25 MG tablet Commonly known as: HYDRODIURIL TAKE 1 TABLET BY MOUTH EVERY DAY   losartan 50 MG tablet Commonly known as: COZAAR TAKE 1 TABLET BY MOUTH EVERY DAY   methocarbamol 500 MG tablet Commonly known as: Robaxin Take 2 tablets (1,000 mg total) by mouth every 8 (eight) hours as needed for muscle spasms.   nitroGLYCERIN 0.4 MG SL tablet Commonly known as: NITROSTAT Place 1 tablet (0.4 mg total) under the tongue every 5 (five) minutes as needed.   oxyCODONE-acetaminophen 5-325 MG tablet Commonly known as: PERCOCET/ROXICET Take 1-2 tablets by mouth every 6 (six) hours as needed for moderate pain or severe pain.   propranolol 10 MG tablet Commonly known as: INDERAL Take 1 tablet (10 mg total) by mouth 2 (two) times daily. What changed: when to take this       Follow-up Information    Vallarie Mare, MD Follow up in 2 week(s).   Specialty: Neurosurgery Contact information: [address] [place]  [address] [phone]               Signed: Vallarie Randall 01/23/2021, 2:47 PM

## 2021-01-23 NOTE — Discharge Instructions (Signed)

## 2021-01-23 NOTE — Evaluation (Signed)
Physical Therapy Evaluation Patient Details Name: Sarah Randall MRN: [id number] DOB: [birth year] Today's Date: 01/23/2021   History of Present Illness  Patient is a 55 y.o. female presented to the clinic for worsening mechanical back pain with bilateral leg pains and numbness.  She was found to have L4-5 degenerative spondylolisthesis with significant movement with flexion and extension.  Due to failure of nonsurgical therapies, surgical options were discussed with her. Pt admitted for elective L4-5 Oblique ALIF.   Clinical Impression  Pt admitted with above diagnosis. At the time of PT eval, pt was able to demonstrate transfers and ambulation with gross min guard assist to supervision for safety and RW for support. Pt was educated on precautions, brace application/wearing schedule, appropriate activity progression, and car transfer. Pt currently with functional limitations due to the deficits listed below (see PT Problem List). Pt will benefit from skilled PT to increase their independence and safety with mobility to allow discharge to the venue listed below.      Follow Up Recommendations No PT follow up;Supervision for mobility/OOB    Equipment Recommendations  None recommended by PT    Recommendations for Other Services       Precautions / Restrictions Precautions Precautions: Fall;Back Precaution Booklet Issued: Yes (comment) Required Braces or Orthoses: Spinal Brace Spinal Brace: Lumbar corset Restrictions Weight Bearing Restrictions: No      Mobility  Bed Mobility Overal bed mobility: Needs Assistance Bed Mobility: Rolling;Sit to Sidelying Rolling: Modified independent (Device/Increase time) Sidelying to sit: Min assist     Sit to sidelying: Supervision General bed mobility comments: Pt was able to transition back to bed at end of session with HOB flat and min use of railings for support.    Transfers Overall transfer level: Needs assistance Equipment used: Rolling  walker (2 wheeled) Transfers: Sit to/from Stand Sit to Stand: Supervision Stand pivot transfers: Min guard       General transfer comment: Pt demonstrated proper hand placement on seated surface for safety. No assist required.  Ambulation/Gait Ambulation/Gait assistance: Supervision;Min guard Gait Distance (Feet): 250 Feet Assistive device: Rolling walker (2 wheeled) Gait Pattern/deviations: Step-through pattern;Decreased stride length;Trunk flexed Gait velocity: Decreased Gait velocity interpretation: <1.31 ft/sec, indicative of household ambulator General Gait Details: VC's for improved posture, closer walker proximity, and forward gaze. No assist required and pt progressed from min guard assist to supervision for safety throughout gait training.  Stairs            Wheelchair Mobility    Modified Rankin (Stroke Patients Only)       Balance Overall balance assessment: Needs assistance Sitting-balance support: No upper extremity supported;Feet supported Sitting balance-Leahy Scale: Good     Standing balance support: Bilateral upper extremity supported;Single extremity supported;During functional activity Standing balance-Leahy Scale: Poor Standing balance comment: reliant on at least one UE support in standing                             Pertinent Vitals/Pain Pain Assessment: Faces Faces Pain Scale: Hurts little more Pain Location: low back at incision site Pain Descriptors / Indicators: Sore Pain Intervention(s): Limited activity within patient's tolerance;Monitored during session;Repositioned    Home Living Family/patient expects to be discharged to:: Private residence Living Arrangements: Spouse/significant other Available Help at Discharge: Family;Available 24 hours/day Type of Home: Apartment Home Access: Stairs to enter   Entrance Stairs-Number of Steps: 1 small threshold Home Layout: One level Home Equipment: Walker - 2 wheels;Cane -  quad;Cane - single point;Toilet riser;Shower seat;Grab bars - toilet;Grab bars - tub/shower;Hand held shower head;Adaptive equipment Additional Comments: Husband plans to take a couple of weeks off to be with pt at home    Prior Function Level of Independence: Independent with assistive device(s)         Comments: Occasional use of cane when LE feeling weaker or pain too much. Able to complete ADLs, some iADLs in the home. Limited by pain for completion of laundry, bed making, etc. Shares IADLs with fiance     Hand Dominance   Dominant Hand: Left    Extremity/Trunk Assessment   Upper Extremity Assessment Upper Extremity Assessment: Defer to OT evaluation    Lower Extremity Assessment Lower Extremity Assessment: Generalized weakness (Consistent with pre-op diagnosis)    Cervical / Trunk Assessment Cervical / Trunk Assessment: Other exceptions Cervical / Trunk Exceptions: s/p surgery  Communication   Communication: No difficulties  Cognition Arousal/Alertness: Awake/alert Behavior During Therapy: WFL for tasks assessed/performed Overall Cognitive Status: Within Functional Limits for tasks assessed                                        General Comments General comments (skin integrity, edema, etc.): VSS on RA.    Exercises     Assessment/Plan    PT Assessment Patient needs continued PT services  PT Problem List Decreased strength;Decreased activity tolerance;Decreased balance;Decreased mobility;Decreased knowledge of use of DME;Decreased safety awareness;Decreased knowledge of precautions;Pain       PT Treatment Interventions DME instruction;Gait training;Functional mobility training;Therapeutic activities;Therapeutic exercise;Neuromuscular re-education;Patient/family education    PT Goals (Current goals can be found in the Care Plan section)  Acute Rehab PT Goals Patient Stated Goal: be able to go home today PT Goal Formulation: With patient Time  For Goal Achievement: 01/30/21 Potential to Achieve Goals: Good    Frequency Min 5X/week   Barriers to discharge        Co-evaluation               AM-PAC PT "6 Clicks" Mobility  Outcome Measure Help needed turning from your back to your side while in a flat bed without using bedrails?: None Help needed moving from lying on your back to sitting on the side of a flat bed without using bedrails?: A Little Help needed moving to and from a bed to a chair (including a wheelchair)?: A Little Help needed standing up from a chair using your arms (e.g., wheelchair or bedside chair)?: A Little Help needed to walk in hospital room?: A Little Help needed climbing 3-5 steps with a railing? : A Little 6 Click Score: 19    End of Session Equipment Utilized During Treatment: Gait belt;Back brace Activity Tolerance: Patient tolerated treatment well Patient left: in bed;with call bell/phone within reach Nurse Communication: Mobility status PT Visit Diagnosis: Unsteadiness on feet (R26.81);Pain Pain - part of body:  (back)    Time: 0932-6712 PT Time Calculation (min) (ACUTE ONLY): 23 min   Charges:   PT Evaluation $PT Eval Low Complexity: 1 Low PT Treatments $Gait Training: 8-22 mins        Rolinda Roan, PT, DPT Acute Rehabilitation Services Pager: [phone] Office: [phone]   Thelma Comp 01/23/2021, 11:02 AM

## 2021-01-23 NOTE — Progress Notes (Addendum)
Progress Note    01/23/2021 8:09 AM 1 Day Post-Op  Subjective:  Sitting up on side of bed. OT at bedside. She is voiding spontaneously, has ambulated and tolerating her diet   Vitals:   01/23/21 0332 01/23/21 0738  BP: 110/75 (!) 114/104  Pulse: 90 81  Resp: 20 18  Temp: 98.7 F (37.1 C) 97.8 F (36.6 C)  SpO2: 99% 100%    Physical Exam: General appearance: Awake, alert in no apparent distress Cardiac: Heart rate and rhythm are regular Respirations: Nonlabored Incisions: Left lower quadrant incision with gauze dressing place with small amount of strike through. Extremities: Both feet are warm with intact sensation and motor function.     CBC    Component Value Date/Time   WBC 15.3 (H) 01/17/2021 1505   RBC 4.69 01/17/2021 1505   HGB 13.9 01/22/2021 1335   HGB 14.9 11/24/2019 1339   HGB 14.9 09/07/2019 1601   HCT 41.0 01/22/2021 1335   HCT 44.2 09/07/2019 1601   PLT 452 (H) 01/17/2021 1505   PLT 330 11/24/2019 1339   PLT 420 11/14/2018 1546   MCV 92.8 01/17/2021 1505   MCV 91 11/14/2018 1546   MCH 31.3 01/17/2021 1505   MCHC 33.8 01/17/2021 1505   RDW 13.4 01/17/2021 1505   RDW 12.7 11/14/2018 1546   LYMPHSABS 5.3 (H) 11/24/2019 1339   LYMPHSABS 5.5 (H) 11/14/2018 1546   MONOABS 0.7 11/24/2019 1339   EOSABS 0.3 11/24/2019 1339   EOSABS 0.3 11/14/2018 1546   BASOSABS 0.1 11/24/2019 1339   BASOSABS 0.1 11/14/2018 1546    BMET    Component Value Date/Time   NA 133 (L) 01/22/2021 1335   NA 138 06/20/2019 0943   K 3.9 01/22/2021 1335   CL 99 01/17/2021 1505   CO2 26 01/17/2021 1505   GLUCOSE 100 (H) 01/17/2021 1505   BUN 12 01/17/2021 1505   BUN 12 06/20/2019 0943   CREATININE 0.97 01/17/2021 1505   CREATININE 0.94 06/15/2019 1012   CALCIUM 9.0 01/17/2021 1505   GFRNONAA >60 01/17/2021 1505   GFRNONAA >60 06/15/2019 1012   GFRAA >60 09/06/2020 0606   GFRAA >60 06/15/2019 1012     Intake/Output Summary (Last 24 hours) at 01/23/2021 0809 Last  data filed at 01/23/2021 0500 Gross per 24 hour  Intake 4630 ml  Output 1000 ml  Net 3630 ml    HOSPITAL MEDICATIONS Scheduled Meds: . amitriptyline  25 mg Oral QHS  . docusate sodium  100 mg Oral BID  . enoxaparin (LOVENOX) injection  40 mg Subcutaneous Q24H  . hydrochlorothiazide  25 mg Oral Daily  . losartan  50 mg Oral Daily  . pantoprazole  40 mg Oral Daily  . propranolol  10 mg Oral BID  . sodium chloride flush  3 mL Intravenous Q12H   Continuous Infusions: . sodium chloride 250 mL (01/22/21 2052)  . 0.9 % NaCl with KCl 20 mEq / L 75 mL/hr at 01/22/21 2042  .  ceFAZolin (ANCEF) IV 2 g (01/23/21 0405)   PRN Meds:.acetaminophen **OR** acetaminophen, menthol-cetylpyridinium **OR** phenol, methocarbamol, morphine injection, nitroGLYCERIN, ondansetron **OR** ondansetron (ZOFRAN) IV, oxyCODONE-acetaminophen, sodium chloride flush, sodium phosphate  Assessment and Plan: POD # 1 Oblique abdominal exposure at the L4-L5 disc space for L4-L5 OLIF. Progressing nicely.  -DVT prophylaxis:  Lovenox   Risa Grill, PA-C [facility] [phone] 01/23/2021  8:09 AM   I have seen and evaluated the patient. I agree with the PA note as documented above.  Postop day 1 status post L4-L5 OLIF abdominal exposure.  This morning she is walking in the room with therapy and states she has had no significant abdominal pain overnight.  Tolerating p.o.  States most of her pain is in her back.  Marty Heck, MD [facility] [place] Office: [phone]

## 2021-01-23 NOTE — Evaluation (Signed)
Occupational Therapy Evaluation/Discharge Patient Details Name: Sarah Randall MRN: [id number] DOB: 12-17-1965 Today's Date: 01/23/2021    History of Present Illness Patient is a 55 y.o. female presented to the clinic for worsening mechanical back pain with bilateral leg pains and numbness.  She was found to have L4-5 degenerative spondylolisthesis with significant movement with flexion and extension.  Due to failure of nonsurgical therapies, surgical options were discussed with her. Pt admitted for elective L4-5 OLIF.   Clinical Impression   PTA, pt lives with fiance and reports Modified Independence with ADLs, mobility in the home with use of cane as needed. Pt shares IADL tasks with fiance and reports increasing difficulty with daily tasks due to pain. Pt presents now with minor deficits in pain, standing balance and endurance. Pt able to demo bed mobility at [place], mobility in room using RW at min guard progressing to supervision at times. Pt overall Setup for UB ADLs and Mod A for LB ADLs (without use of AE). Pt noted with increasing L LE weakness/discomfort with prolonged standing. Pt appears to have all needed DME, as well as adaptive equipment. Educated pt on tub transfer strategies and encouraged pt to have fiance with her when attempting this at home. Pt's fiance plans to take time off of work to be with pt at home. Pt verbalized understanding of all education and no further skilled OT services needed. OT to sign off. Thank you for this referral.     Follow Up Recommendations  No OT follow up;Supervision - Intermittent    Equipment Recommendations  None recommended by OT (well equipped)    Recommendations for Other Services       Precautions / Restrictions Precautions Precautions: Fall;Back Precaution Booklet Issued: Yes (comment) Required Braces or Orthoses: Spinal Brace Spinal Brace: Lumbar corset Restrictions Weight Bearing Restrictions: No      Mobility Bed  Mobility Overal bed mobility: Needs Assistance Bed Mobility: Rolling;Sidelying to Sit Rolling: Min guard Sidelying to sit: Min assist       General bed mobility comments: Light Min A to get B LE off of bed, able to push through elbow to advance torso at min guard. Completed this task without use of bedrails    Transfers Overall transfer level: Needs assistance Equipment used: Rolling walker (2 wheeled) Transfers: Sit to/from [facility] Sit to Stand: Supervision Stand pivot transfers: Min guard       General transfer comment: Supervision, able to keep spine straight with transitional movements. Pt min guard for turning with RW, noted increasing L LE weakness/soreness with increased activity    Balance Overall balance assessment: Needs assistance Sitting-balance support: No upper extremity supported;Feet supported Sitting balance-Leahy Scale: Good     Standing balance support: Bilateral upper extremity supported;Single extremity supported;During functional activity Standing balance-Leahy Scale: Poor Standing balance comment: reliant on at least one UE support in standing                           ADL either performed or assessed with clinical judgement   ADL Overall ADL's : Needs assistance/impaired Eating/Feeding: Independent;Sitting   Grooming: Standing;Supervision/safety;Oral care Grooming Details (indicate cue type and reason): Supervision to ensure maintenance of back precautions during task. able to return demo education well Upper Body Bathing: Set up;Sitting   Lower Body Bathing: Minimal assistance;Sit to/from stand   Upper Body Dressing : Set up;Sitting Upper Body Dressing Details (indicate cue type and reason): Setup to don brace,  initial cues for orientation of brace but able to return demo after education Lower Body Dressing: Moderate assistance;Sit to/from stand Lower Body Dressing Details (indicate cue type and reason): Overall  Mod A for LB Dressing due to inability to form figure four position with L LE (hx of hip sx), and unable to bend to feet due to back precautions. Educated on techniques to trial, use of AE (pt reports she should be able to do this easily) or assist from fiance as needed Toilet Transfer: Min guard;Ambulation;RW;Regular Toilet;BSC   Toileting- Water quality scientist and Hygiene: Min guard;Sitting/lateral lean;Sit to/from stand       Functional mobility during ADLs: Min guard;Rolling walker General ADL Comments: Pt with minor limitations due to pain, back precautions during tasks. Pt has all needed DME and able to verbalize understanding of safety technique strategies.     Vision Baseline Vision/History: Wears glasses Wears Glasses: At all times Patient Visual Report: No change from baseline Vision Assessment?: No apparent visual deficits     Perception     Praxis      Pertinent Vitals/Pain Pain Assessment: Faces Faces Pain Scale: Hurts little more Pain Location: low back at incision site Pain Descriptors / Indicators: Sore Pain Intervention(s): Monitored during session;Limited activity within patient's tolerance;Repositioned     Hand Dominance Left   Extremity/Trunk Assessment Upper Extremity Assessment Upper Extremity Assessment: Overall WFL for tasks assessed   Lower Extremity Assessment Lower Extremity Assessment: Defer to PT evaluation   Cervical / Trunk Assessment Cervical / Trunk Assessment: Normal   Communication Communication Communication: No difficulties   Cognition Arousal/Alertness: Awake/alert Behavior During Therapy: WFL for tasks assessed/performed Overall Cognitive Status: Within Functional Limits for tasks assessed                                     General Comments  VSS on RA.    Exercises     Shoulder Instructions      Home Living Family/patient expects to be discharged to:: Private residence Living Arrangements:  Spouse/significant other Available Help at Discharge: Family;Available 24 hours/day Type of Home: Apartment Home Access: Stairs to enter Entrance Stairs-Number of Steps: 1 small threshold   Home Layout: One level     Bathroom Shower/Tub: Teacher, early years/pre: Handicapped height     Home Equipment: Environmental consultant - 2 wheels;Cane - quad;Cane - single point;Toilet riser;Shower seat;Grab bars - toilet;Grab bars - tub/shower;Hand held shower head;Adaptive equipment Adaptive Equipment: Reacher;Sock aid;Long-handled shoe horn Additional Comments: Husband plans to take a couple of weeks off to be with pt at home      Prior Functioning/Environment Level of Independence: Independent with assistive device(s)        Comments: Occasional use of cane when LE feeling weaker or pain too much. Able to complete ADLs, some iADLs in the home. Limited by pain for completion of laundry, bed making, etc. Shares IADLs with fiance        OT Problem List: Decreased activity tolerance;Impaired balance (sitting and/or standing);Pain      OT Treatment/Interventions:      OT Goals(Current goals can be found in the care plan section) Acute Rehab OT Goals Patient Stated Goal: be able to go home today  OT Frequency:     Barriers to D/C:            Co-evaluation  AM-PAC OT "6 Clicks" Daily Activity     Outcome Measure Help from another person eating meals?: None Help from another person taking care of personal grooming?: A Little Help from another person toileting, which includes using toliet, bedpan, or urinal?: A Little Help from another person bathing (including washing, rinsing, drying)?: A Little Help from another person to put on and taking off regular upper body clothing?: A Little Help from another person to put on and taking off regular lower body clothing?: A Lot 6 Click Score: 18   End of Session Equipment Utilized During Treatment: Back brace;Rolling  walker Nurse Communication: Mobility status  Activity Tolerance: Patient tolerated treatment well Patient left: in chair;with call bell/phone within reach  OT Visit Diagnosis: Other abnormalities of gait and mobility (R26.89);Pain Pain - part of body:  (back)                Time: 0732-0806 OT Time Calculation (min): 34 min Charges:  OT General Charges $OT Visit: 1 Visit OT Evaluation $OT Eval Moderate Complexity: 1 Mod OT Treatments $Self Care/Home Management : 8-22 mins  Malachy Chamber, OTR/L Acute Rehab Services Office: [phone]  Layla Maw 01/23/2021, 8:26 AM

## 2021-01-24 ENCOUNTER — Encounter (HOSPITAL_COMMUNITY): Payer: Self-pay | Admitting: Neurosurgery

## 2021-01-24 ENCOUNTER — Telehealth: Payer: Self-pay

## 2021-01-24 LAB — BPAM RBC
Blood Product Expiration Date: 202203122359
Blood Product Expiration Date: 202203192359
ISSUE DATE / TIME: 202202130220
Unit Type and Rh: 5100
Unit Type and Rh: 5100

## 2021-01-24 LAB — TYPE AND SCREEN
ABO/RH(D): O POS
Antibody Screen: NEGATIVE
Unit division: 0
Unit division: 0

## 2021-01-24 NOTE — Telephone Encounter (Signed)
Transition Care Management Follow-up Telephone Call  Date of discharge and from where:[facility] 01/23/2021 How have you been since you were released from the hospital? Better  Any questions or concerns? No questions/concerns reported.  Items Reviewed: Did the pt receive and understand the discharge instructions provided? Stated that have the instructions and have no questions.  Medications obtained and verified? She said that have the medication list  and the hospital staff reviewed them in detail prior to discharge. She said that he has all of the medications and have no questions.  Any new allergies since your discharge? None reported  Do you have support at home? Yes, friends Other (ie: DME, Home Health, etc)       Functional Questionnaire: (I = Independent and D = Dependent) ADL's:  Independent.     Ambulation using a Walker    Follow up appointments reviewed:   PCP Hospital f/u appt confirmed?Dr Wynetta Emery 02/06/2021 @ 1050.  [facility] f/u appt confirmed? None scheduled at this time  Are transportation arrangements needed?  She said she  has transportation   If their condition worsens, is the pt aware to call  their PCP or go to the ED? Yes.Made pt aware if condition worsen or start experiencing rapid weight gain, chest pain, diff breathing, SOB, high fevers, or bleading to refer imediately to ED for further evaluation.  Was the patient provided with contact information for the PCP's office or ED? He has the phone number  Was the pt encouraged to call back with questions or concerns?yes

## 2021-01-27 MED FILL — Sodium Chloride IV Soln 0.9%: INTRAVENOUS | Qty: 1000 | Status: AC

## 2021-01-27 MED FILL — Heparin Sodium (Porcine) Inj 1000 Unit/ML: INTRAMUSCULAR | Qty: 1 | Status: AC

## 2021-01-30 ENCOUNTER — Other Ambulatory Visit: Payer: Self-pay | Admitting: Internal Medicine

## 2021-01-30 DIAGNOSIS — G250 Essential tremor: Secondary | ICD-10-CM

## 2021-01-30 DIAGNOSIS — G43011 Migraine without aura, intractable, with status migrainosus: Secondary | ICD-10-CM

## 2021-01-30 NOTE — Telephone Encounter (Signed)
Requested medications are due for refill today yes  Requested medications are on the active medication list yes  Last refill 12/3  Future visit scheduled 02/06/2021  Notes to clinic Pharmacy note states pt taking once a day instead of twice, please assess.

## 2021-02-06 ENCOUNTER — Ambulatory Visit: Payer: Medicare Other | Admitting: Internal Medicine

## 2021-02-08 ENCOUNTER — Other Ambulatory Visit: Payer: Self-pay | Admitting: Physical Medicine & Rehabilitation

## 2021-02-10 ENCOUNTER — Encounter: Payer: Self-pay | Admitting: Physical Medicine & Rehabilitation

## 2021-02-10 ENCOUNTER — Encounter: Payer: Medicare Other | Attending: Physical Medicine & Rehabilitation | Admitting: Physical Medicine & Rehabilitation

## 2021-02-10 ENCOUNTER — Other Ambulatory Visit: Payer: Self-pay

## 2021-02-10 ENCOUNTER — Other Ambulatory Visit: Payer: Self-pay | Admitting: Neurosurgery

## 2021-02-10 VITALS — BP 128/86 | HR 87 | Temp 98.8°F | Ht 64.0 in | Wt 290.6 lb

## 2021-02-10 DIAGNOSIS — E669 Obesity, unspecified: Secondary | ICD-10-CM | POA: Diagnosis present

## 2021-02-10 DIAGNOSIS — G8929 Other chronic pain: Secondary | ICD-10-CM | POA: Diagnosis not present

## 2021-02-10 DIAGNOSIS — R269 Unspecified abnormalities of gait and mobility: Secondary | ICD-10-CM | POA: Insufficient documentation

## 2021-02-10 DIAGNOSIS — M5416 Radiculopathy, lumbar region: Secondary | ICD-10-CM | POA: Diagnosis not present

## 2021-02-10 DIAGNOSIS — G894 Chronic pain syndrome: Secondary | ICD-10-CM | POA: Diagnosis not present

## 2021-02-10 DIAGNOSIS — M4316 Spondylolisthesis, lumbar region: Secondary | ICD-10-CM

## 2021-02-10 DIAGNOSIS — M2550 Pain in unspecified joint: Secondary | ICD-10-CM | POA: Insufficient documentation

## 2021-02-10 DIAGNOSIS — G479 Sleep disorder, unspecified: Secondary | ICD-10-CM | POA: Diagnosis not present

## 2021-02-10 DIAGNOSIS — M5441 Lumbago with sciatica, right side: Secondary | ICD-10-CM | POA: Diagnosis not present

## 2021-02-10 MED ORDER — METHOCARBAMOL 500 MG PO TABS: 1000.0000 mg | ORAL_TABLET | Freq: Four times a day (QID) | ORAL | 1 refills | Status: DC | PRN

## 2021-02-10 NOTE — Progress Notes (Signed)
Subjective:    Patient ID: Sarah Randall, female    DOB: 23-Aug-1966, 55 y.o.   MRN: [id number]  HPI Female with pmh/psh OSA, depression, OA, anxiety, left THA, right TKA present with back problems.  Initially stated: Started ~10/2019. Denies inciting event.  Getting worse. Laying down improves the pain. Heat/ice help.  Limited ambulation, sitting exacerbates the pain. Sharp, shooting down right leg. Constant.  Radiates to posterior right leg to ankle.  Associated weakness. Bending forward improves pain. Gabapentin and Pregabalin caused edema. Diclofenac does not help.  OTC meds do not help. She had right L5-S1 and left L4-5 ESIs, with relief for 1 week only. Back brace helps. Denies falls. Pain limits ADLs. She saw 2 different orthos and is seeing Ortho spine and was told she had a "slipped disc" and was told she needed surgery.  Back pain worst in morning, lasting ~1 hour.    Last clinic visit on 11/07/2020.  Since that time, had back surgery in 01/2021.  Patient states she is having left leg pain.  She saw surgery, CT was ordered. She has not been to pool therapy yet.  She could not tolerate Cymbalta. She had skin reaction to Elavil.  She has some benefit with Robaxin. She saw Rheum and did not think she needed further workup.  She has not tried exercises.  Denies falls. She has not lost weight.   Pain Inventory Average Pain 10 Pain Right Now 9 My pain is constant, sharp, burning and aching  In the last 24 hours, has pain interfered with the following? General activity 9 Relation with others 4 Enjoyment of life 9 What TIME of day is your pain at its worst? morning  and night Sleep (in general) Poor  Pain is worse with: walking, sitting and standing Pain improves with: rest and heat/ice Relief from Meds: 6   Family History  Problem Relation Age of Onset  . Hypertension Mother   . Diabetes Mother   . Breast cancer Mother 44  . Hypertension Father   . Lung cancer Maternal Aunt   .  Colon polyps Brother   . Colon cancer Neg Hx   . Esophageal cancer Neg Hx   . Rectal cancer Neg Hx   . Stomach cancer Neg Hx    Social History   Socioeconomic History  . Marital status: Single    Spouse name: Not on file  . Number of children: 2  . Years of education: 9  . Highest education level: Not on file  Occupational History  . Occupation: unemployed  Tobacco Use  . Smoking status: Current Every Day Smoker    Packs/day: 0.25    Years: 30.00    Pack years: 7.50    Types: Cigarettes  . Smokeless tobacco: Never Used  . Tobacco comment: Smokes 2 cigarettes per day  Vaping Use  . Vaping Use: Never used  Substance and Sexual Activity  . Alcohol use: Yes    Alcohol/week: 4.0 - 6.0 standard drinks    Types: 4 - 6 Shots of liquor per week    Comment: occ  . Drug use: Yes    Frequency: 2.0 times per week    Types: Marijuana  . Sexual activity: Yes    Birth control/protection: None    Comment: declined insurance questions,des neg  Other Topics Concern  . Not on file  Social History Narrative  . Not on file   Social Determinants of Health   Financial Resource Strain: Not on  file  Food Insecurity: Not on file  Transportation Needs: Not on file  Physical Activity: Not on file  Stress: Not on file  Social Connections: Not on file   Past Surgical History:  Procedure Laterality Date  . ABDOMINAL EXPOSURE N/A 01/22/2021   Procedure: LATERAL EXPOSURE FOR OBLIQUE LATERAL INTERBODY FUSION;  Surgeon: Marty Heck, MD;  Location: [place];  Service: Vascular;  Laterality: N/A;  . ABDOMINAL HYSTERECTOMY    . ANTERIOR LUMBAR FUSION N/A 01/22/2021   Procedure: Oblique Lateral Interbody Fusion Lumbar Four-Lumbar Five;  Surgeon: Vallarie Mare, MD;  Location: [place];  Service: Neurosurgery;  Laterality: N/A;  . LEFT HEART CATH AND CORONARY ANGIOGRAPHY N/A 10/25/2018   Procedure: LEFT HEART CATH AND CORONARY ANGIOGRAPHY;  Surgeon: Martinique, Peter M, MD;  Location: [place]  [facility];  Service: Cardiovascular;  Laterality: N/A;  . LUMBAR PERCUTANEOUS PEDICLE SCREW 1 LEVEL N/A 01/22/2021   Procedure: LUMBAR PERCUTANEOUS PEDICLE SCREW LUBAR FOUR- FIVE;  Surgeon: Vallarie Mare, MD;  Location: [place];  Service: Neurosurgery;  Laterality: N/A;  . ROBOTIC ASSISTED LAPAROSCOPIC HYSTERECTOMY AND SALPINGECTOMY Bilateral 12/27/2019   Procedure: XI ROBOTIC ASSISTED TOTAL LAPAROSCOPIC HYSTERECTOMY AND SALPINGECTOMY;  Surgeon: Princess Bruins, MD;  Location: [place];  Service: Gynecology;  Laterality: Bilateral;  . TOTAL HIP ARTHROPLASTY Left 09/22/2017   Procedure: LEFT TOTAL HIP ARTHROPLASTY ANTERIOR APPROACH;  Surgeon: Leandrew Koyanagi, MD;  Location: [place];  Service: Orthopedics;  Laterality: Left;  . TOTAL KNEE ARTHROPLASTY Right 01/12/2018   Procedure: RIGHT TOTAL KNEE ARTHROPLASTY;  Surgeon: Leandrew Koyanagi, MD;  Location: [place];  Service: Orthopedics;  Laterality: Right;  . TUBAL LIGATION     Past Medical History:  Diagnosis Date  . Anemia   . Anxiety   . Arthritis   . B12 deficiency 06/30/2019  . Depression   . GERD (gastroesophageal reflux disease)    occasionally takes OTC  . History of leukocytosis   . Hyperlipidemia   . Hypertension   . Low back pain   . Obesity   . Pain in right buttock    Goes down to Right leg to lateral aspect of calf.  . Pre-diabetes   . Sleep apnea    tested 2014 - unable to tolerate machine  . Spondylolisthesis at L4-L5 level    BP 128/86   Pulse 87   Temp 98.8 F (37.1 C)   Ht 5\' 4"  (1.626 m)   Wt 290 lb 9.6 oz (131.8 kg)   LMP  (LMP Unknown)   SpO2 98%   BMI 49.88 kg/m   Opioid Risk Score:   Fall Risk Score:  `1  Depression screen PHQ 2/9  Depression screen [facility] 2/9 10/10/2020 09/16/2020 09/07/2019 06/20/2019 11/10/2018 10/10/2018 08/30/2017  Decreased Interest 0 2 0 1 1 2 3   Down, Depressed, Hopeless 0 2 0 1 2 2 2   PHQ - 2 Score 0 4 0 2 3 4 5   Altered sleeping - 1 - 0 2 1 3   Tired, decreased energy -  1 - 1 1 2 2   Change in appetite - 1 - 0 2 1 1   Feeling bad or failure about yourself  - 1 - 1 1 1 2   Trouble concentrating - 1 - 1 2 1 2   Moving slowly or fidgety/restless - 0 - 0 1 0 1  Suicidal thoughts - 1 - 0 0 1 1  PHQ-9 Score - 10 - 5 12 11 17    Review  of Systems  Constitutional: Positive for unexpected weight change.       Weight gain  HENT: Negative.   Eyes: Negative.   Respiratory: Negative.   Cardiovascular: Negative.   Gastrointestinal: Negative.   Endocrine: Negative.   Genitourinary: Negative.   Musculoskeletal: Positive for arthralgias, back pain, gait problem and myalgias.       Buttock & Right leg pain  Skin: Negative.   Allergic/Immunologic: Negative.   Neurological: Positive for weakness. Negative for numbness.  Hematological: Negative.   Psychiatric/Behavioral: Negative.   All other systems reviewed and are negative.     Objective:   Physical Exam  Constitutional: No distress . Vital signs reviewed. HENT: Normocephalic.  Atraumatic. Eyes: EOMI. No discharge. Cardiovascular: No JVD.   Respiratory: Normal effort.  No stridor.   GI: Non-distended.   Skin: Warm and dry.  Intact. Psych: Normal mood.  Normal behavior. Musc: Multiple joint pain  +TTP along incision Gait: Antalgic Neuro: Alert Motor: B/l LE: 5/5 throughout    Assessment & Plan:  Female with pmh/psh OSA, depression, OA, anxiety, left THA, right TKA present with back problems.   1. Chronic mechanical low back pain s/p surgery 01/2021  MRI L-spine 12/2019 reviewed, revealing thoracolumber degeneration, mild/mod stenosis L3-5, foraminal stenosis left L4-5, mild spinal stenosis T11-12, facet arthropathy.  Side effects with Gabapentin, Lyrica, Cymbalta  No benefit with Lidocaine patch  Continue Heat/Cold  She completed PT without benefit  Will refer to aquatic therapy with trial TENS, encouraged follow up, may go to [facility] as alternative, encouraged follow up   Bracing per Ortho spine  No recs  per Rheum  Will increase Robaxin to 1000 4/day PRN  Patient states main goal is to do ADLs  PT per Surgery  Percocet per Surgery  Continue Piriformis exercises, reminded   2. Gait abnormality  Continue cane  Therapies per Surg  3. Sleep disturbance  Unable to tolerate Elavil   Trial Melatonin  4. Super Obesity  Unable to afford dietitian  Significant contributor  Reminded regarding weight loss  5. Myalgia   Will consider trigger point injections

## 2021-02-14 ENCOUNTER — Other Ambulatory Visit: Payer: Self-pay | Admitting: Internal Medicine

## 2021-02-14 DIAGNOSIS — I10 Essential (primary) hypertension: Secondary | ICD-10-CM

## 2021-02-14 NOTE — Telephone Encounter (Signed)
Requested medications are due for refill today yes  Requested medications are on the active medication list yes  Last refill 12/4  Last visit 09/2020  Future visit scheduled no  Notes to clinic Oct visit states return in 3 months, Jan. Has not returned, given 90 day supply in Dec. Please assess.

## 2021-02-17 ENCOUNTER — Ambulatory Visit
Admission: RE | Admit: 2021-02-17 | Discharge: 2021-02-17 | Disposition: A | Discharge status: Home / Self Care | Payer: Medicare Other | Source: Ambulatory Visit | Attending: Neurosurgery | Admitting: Neurosurgery

## 2021-02-17 DIAGNOSIS — M4316 Spondylolisthesis, lumbar region: Secondary | ICD-10-CM

## 2021-02-17 DIAGNOSIS — M545 Low back pain, unspecified: Secondary | ICD-10-CM | POA: Diagnosis not present

## 2021-02-17 IMAGING — CT CT L SPINE W/O CM
3 of 4 series · 13 of 33 positions shown, 16 images · non-contrast
Comparison: Lumbar MRI [DATE]

CLINICAL DATA: Pain and left leg since surgery. Lumbar fusion
[DATE]

EXAM:
CT LUMBAR SPINE WITHOUT CONTRAST
TECHNIQUE: Multidetector CT imaging of the lumbar spine was performed without
intravenous contrast administration. Multiplanar CT image
reconstructions were also generated.

[Series 3: l-spine 2.00 br40 s3 (person_name) · axial · 0.35mm/px · z∈[+1370,+1522]mm · 5 of 114 slices shown, 7 images]
[im 19/114  soft-tissue]
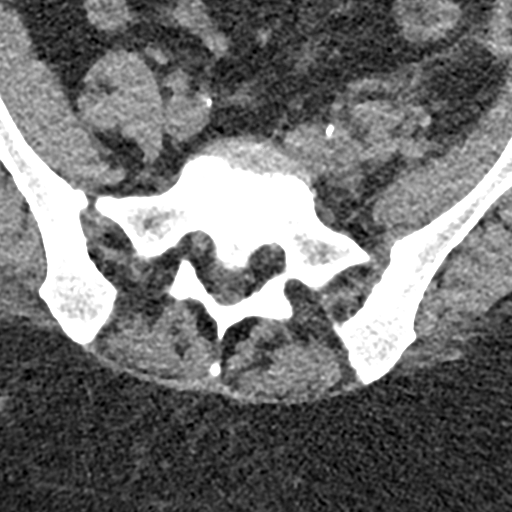
[im 19/114  bone]
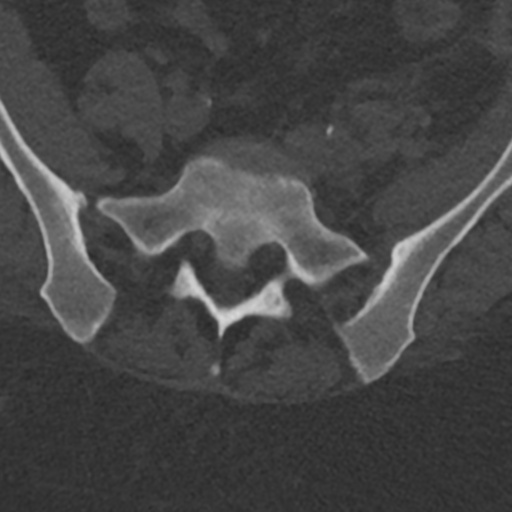
[im 38/114  bone]
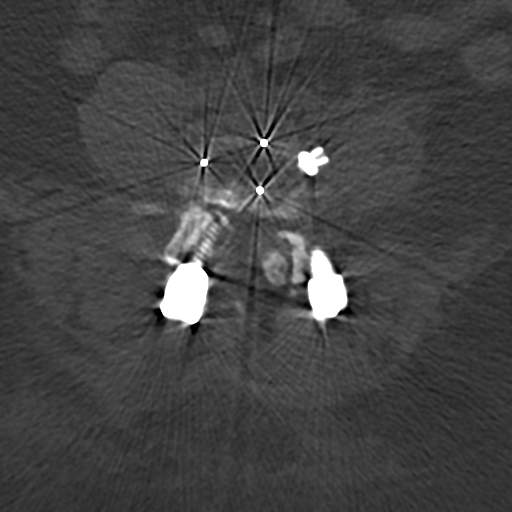
[im 57/114  bone]
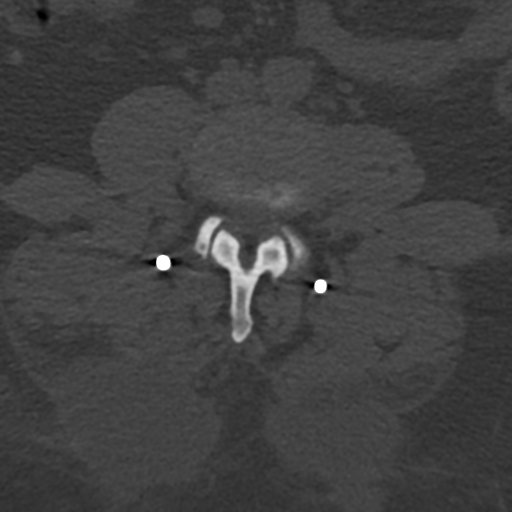
[im 76/114  bone]
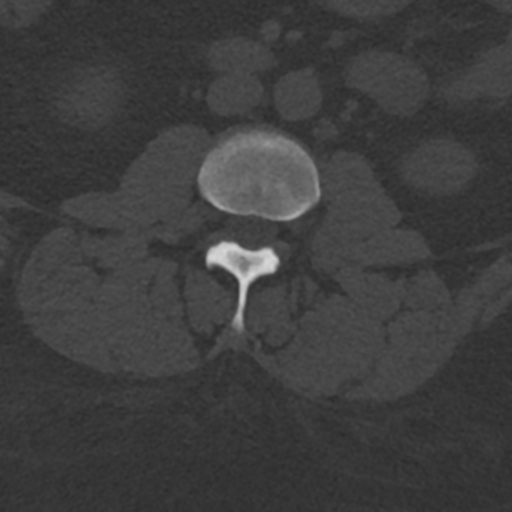
[im 95/114  soft-tissue]
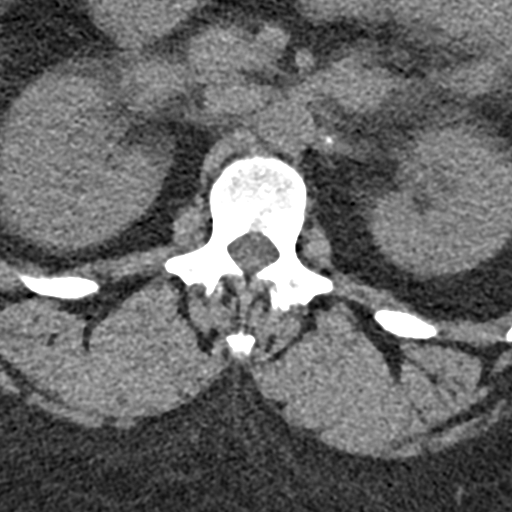
[im 95/114  bone]
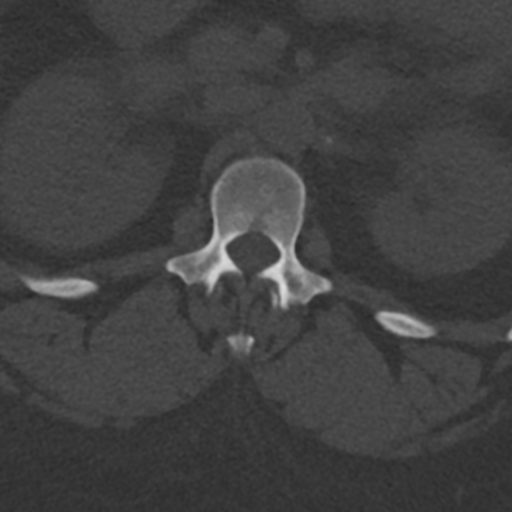

[Series 7: l-spine 2.00 br40 s3 sag soft (person_name) · sagittal · 0.34mm/px · 5 of 80 slices shown, 6 images]
[im 27/80  bone]
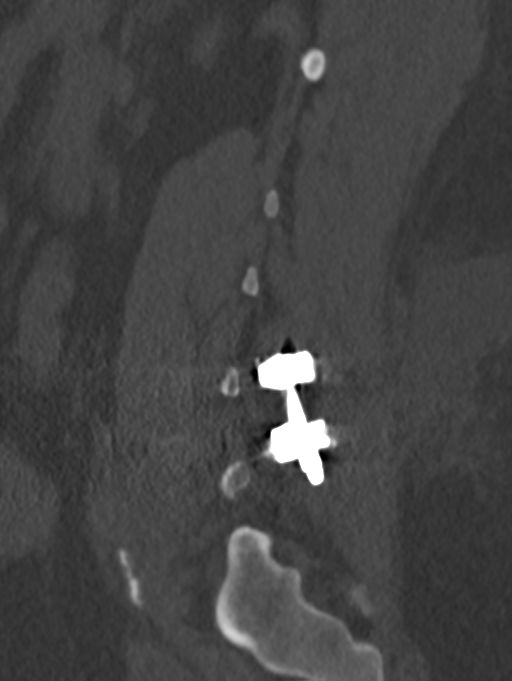
[im 33/80  bone]
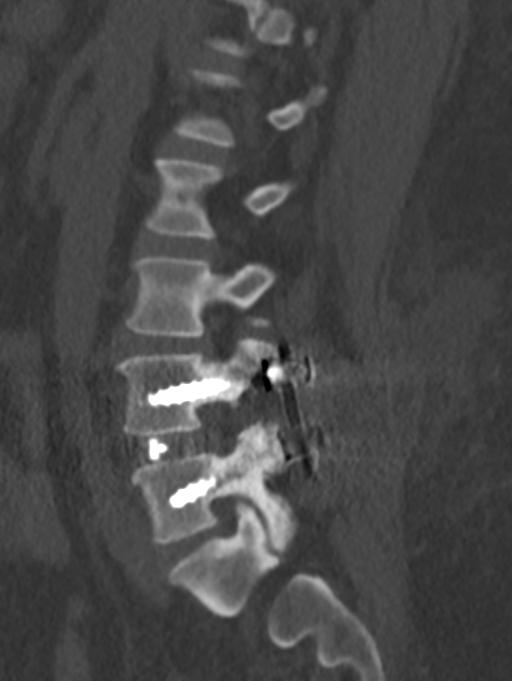
[im 40/80  soft-tissue]
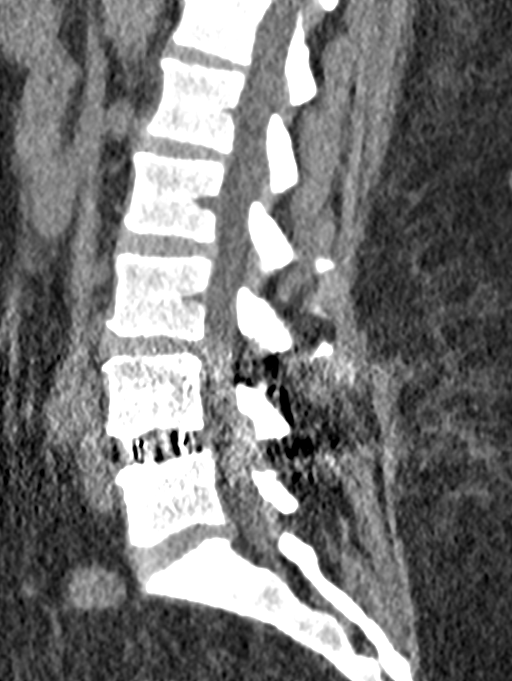
[im 40/80  bone]
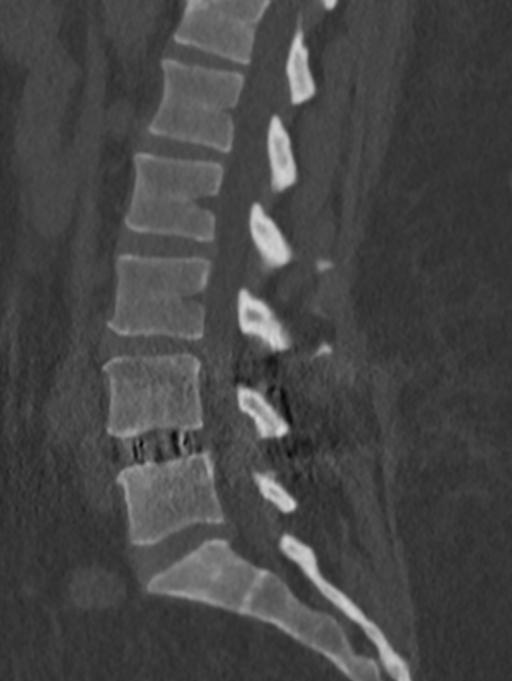
[im 47/80  bone]
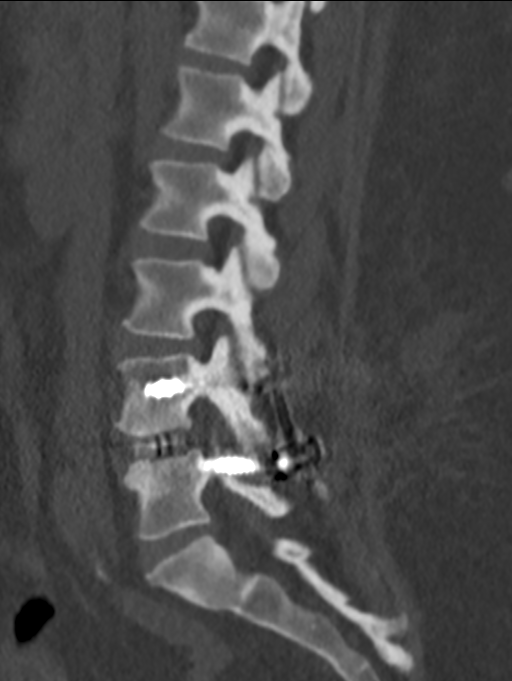
[im 53/80  bone]
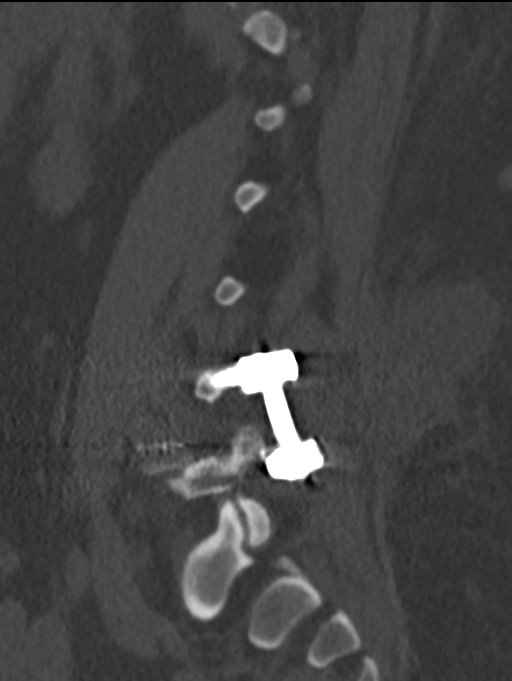

[Series 9: l-spine 2.00 br60 s3 cor bone · coronal · 0.32mm/px · 3 of 86 slices shown]
[im 18/86  bone]
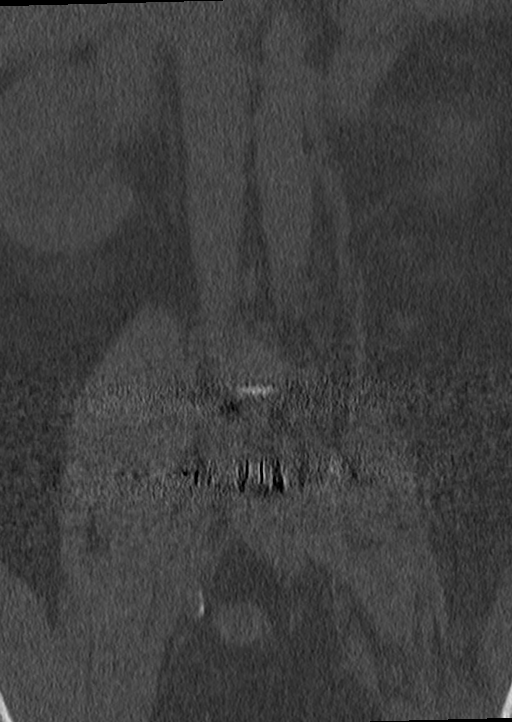
[im 35/86  bone]
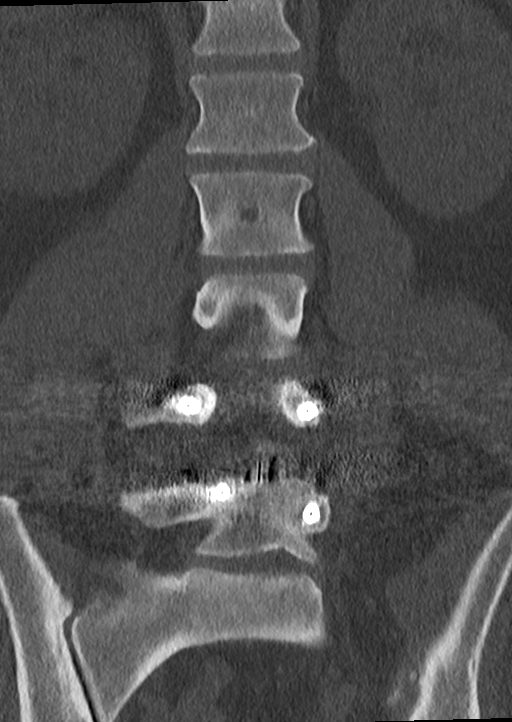
[im 52/86  bone]
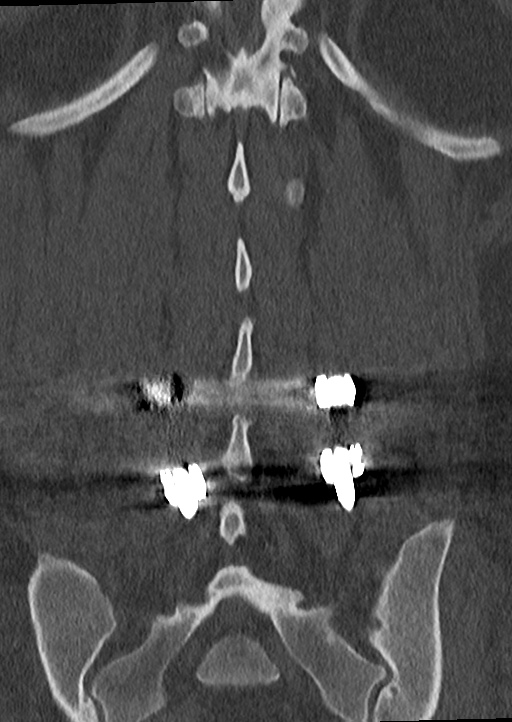

[13 of 33 positions shown; findings below may reference images not displayed]

FINDINGS: Segmentation: Normal

Alignment: Normal

Vertebrae: Negative for fracture or mass

Hardware: Pedicle screw and interbody fusion L4-5. The right L5
screw crosses the lateral recess medial to the pedicle and could
affect the right L5 nerve root. This screw also breaches the
superior endplate of L5. Remaining screws in good position. No
hardware loosening. Solid bony fusion not yet demonstrated.

Paraspinal and other soft tissues: Negative for paraspinous mass or
adenopathy.

Disc levels: L1-2: Mild degenerative changes in the disc and facets.
No spinal stenosis

L2-3: Mild disc bulging and mild facet degeneration. Negative for
stenosis

L3-4: Diffuse bulging of the disc and bilateral facet degeneration.
Mild spinal stenosis.

L4-5: Pedicle screw and interbody fusion as above. Bilateral facet
hypertrophy. Streak artifact from hardware limits evaluation of the
canal. Right L5 screw passes through the lateral recess on the
right.

L5-S1: Small central disc protrusion unchanged. Negative for
stenosis.
IMPRESSION: 1. PLIF L4-5. Right L5 screw passes through the lateral recess and
could affect the right L5 nerve root. Solid fusion not yet
demonstrated at this level
2. Small central disc protrusion L5-S1 without neural impingement.

## 2021-02-26 ENCOUNTER — Other Ambulatory Visit: Payer: Self-pay | Admitting: Internal Medicine

## 2021-02-26 ENCOUNTER — Telehealth: Payer: Self-pay | Admitting: *Deleted

## 2021-02-26 DIAGNOSIS — G43011 Migraine without aura, intractable, with status migrainosus: Secondary | ICD-10-CM

## 2021-02-26 DIAGNOSIS — G250 Essential tremor: Secondary | ICD-10-CM

## 2021-02-26 NOTE — Telephone Encounter (Signed)
Patient requesting Inderal refill. Last OV 02/06/21 was cancelled by the patient. Last refill on 01/31/21 noted patient must keep next scheduled appointment. TC to patient to schedule appointment in order to refill medication. No appointments available across office. Routing to clinic for appointment/reach out to the patient.  LOV 09/16/20.  Courtesy 30 day refill provided.

## 2021-02-26 NOTE — Telephone Encounter (Signed)
Courtesy refill provided for 30 days. TC to patient, however, no availability for OV at this time. Clinic will call patient with appointment. LOV 09/16/20.

## 2021-03-06 ENCOUNTER — Other Ambulatory Visit: Payer: Self-pay | Admitting: Physical Medicine & Rehabilitation

## 2021-03-08 ENCOUNTER — Other Ambulatory Visit: Payer: Self-pay | Admitting: Internal Medicine

## 2021-03-08 DIAGNOSIS — I10 Essential (primary) hypertension: Secondary | ICD-10-CM

## 2021-03-10 NOTE — Telephone Encounter (Signed)
Spoke with Pt and she is scheduled to see Dr. Wynetta Emery April 12th at 2:10

## 2021-03-13 ENCOUNTER — Ambulatory Visit (INDEPENDENT_AMBULATORY_CARE_PROVIDER_SITE_OTHER): Payer: Medicare Other | Admitting: Orthopaedic Surgery

## 2021-03-13 ENCOUNTER — Ambulatory Visit: Payer: Self-pay

## 2021-03-13 ENCOUNTER — Encounter: Payer: Self-pay | Admitting: Orthopaedic Surgery

## 2021-03-13 ENCOUNTER — Other Ambulatory Visit: Payer: Self-pay | Admitting: Internal Medicine

## 2021-03-13 VITALS — Ht 64.0 in | Wt 280.0 lb

## 2021-03-13 DIAGNOSIS — G8929 Other chronic pain: Secondary | ICD-10-CM | POA: Diagnosis not present

## 2021-03-13 DIAGNOSIS — Z6841 Body Mass Index (BMI) 40.0 and over, adult: Secondary | ICD-10-CM | POA: Diagnosis not present

## 2021-03-13 DIAGNOSIS — M25511 Pain in right shoulder: Secondary | ICD-10-CM

## 2021-03-13 DIAGNOSIS — M25552 Pain in left hip: Secondary | ICD-10-CM

## 2021-03-13 DIAGNOSIS — I10 Essential (primary) hypertension: Secondary | ICD-10-CM

## 2021-03-13 MED ORDER — MELOXICAM 7.5 MG PO TABS: 7.5000 mg | ORAL_TABLET | Freq: Two times a day (BID) | ORAL | 2 refills | Status: DC | PRN

## 2021-03-13 NOTE — Progress Notes (Signed)
Office Visit Note   Patient: Sarah Randall           Date of Birth: 1966-04-16           MRN: [id number] Visit Date: 03/13/2021              Requested by: Ladell Pier, MD [address] [place],  [place] [id number] PCP: Ladell Pier, MD   Assessment & Plan: Visit Diagnoses:  1. Chronic right shoulder pain   2. Pain in left hip   3. Body mass index 45.0-49.9, adult ([place])   4. Morbid obesity ([place])     Plan: For the left hip my impression is combination bursitis versus abductor tendinosis.  I have a low concern for problems with the hip replacement itself.  I stressed the impact that being overweight has on this condition.  I offered cortisone injection but she declined.  She would like to just work on home exercises and be more intentional about losing weight.  For the right shoulder impression is chronic right shoulder pain.  I do not get the sense that she has a structural abnormality.  Rotator cuff feels strong.  May be a touch of biceps tendinopathy or bursitis.  Again she declined cortisone injection.  She would like to just do home exercises and try some meloxicam.  We will see her back as needed.  Follow-Up Instructions: Return if symptoms worsen or fail to improve.   Orders:  Orders Placed This Encounter  Procedures  . XR HIP UNILAT W OR W/O PELVIS 2-3 VIEWS LEFT  . XR Shoulder Right   Meds ordered this encounter  Medications  . meloxicam (MOBIC) 7.5 MG tablet    Sig: Take 1 tablet (7.5 mg total) by mouth 2 (two) times daily as needed for pain.    Dispense:  30 tablet    Refill:  2      Procedures: No procedures performed   Clinical Data: No additional findings.   Subjective: Chief Complaint  Patient presents with  . Left Hip - Pain  . Right Shoulder - Pain    Sarah Randall is a 55 year old female who is well-known to me who comes in for evaluation lateral left hip pain and right shoulder pain.  For the left hip she denies any groin pain or back  pain.  Recently had back surgery by Dr. Marcello Moores and is recovering well from this.  She is ambulate with a cane.  Denies any numbness and tingling.  The pain is pretty much all lateral and is worse with laying on that side at nighttime.  For the right shoulder she has had pain for about 57months.  She has noticed some decreased range of motion.  She endorses pain anteriorly.  Denies any numbness and tingling.  She has some pain with abduction of the shoulder.   Review of Systems  Constitutional: Negative.   HENT: Negative.   Eyes: Negative.   Respiratory: Negative.   Cardiovascular: Negative.   Endocrine: Negative.   Musculoskeletal: Negative.   Neurological: Negative.   Hematological: Negative.   Psychiatric/Behavioral: Negative.   All other systems reviewed and are negative.    Objective: Vital Signs: Ht 5\' 4"  (1.626 m)   Wt 280 lb (127 kg)   LMP  (LMP Unknown)   BMI 48.06 kg/m   Physical Exam Vitals and nursing note reviewed.  Constitutional:      Appearance: She is well-developed.  Pulmonary:     Effort: Pulmonary effort  is normal.  Skin:    General: Skin is warm.     Capillary Refill: Capillary refill takes less than 2 seconds.  Neurological:     Mental Status: She is alert and oriented to person, place, and time.  Psychiatric:        Behavior: Behavior normal.        Thought Content: Thought content normal.        Judgment: Judgment normal.     Ortho Exam Left hip shows decent range of motion without any reproducible groin pain.  Lateral aspect of the hip is tender to palpation.  Mild antalgic gait.  Right shoulder shows near normal range of motion with mild pain.  Manual muscle testing of rotator cuff is intact with mild pain.  Moderate pain with Hawkins sign.  Negative Speed test. Specialty Comments:  No specialty comments available.  Imaging: XR HIP UNILAT W OR W/O PELVIS 2-3 VIEWS LEFT  Result Date: 03/13/2021 Stable total hip replacement without  complications.  Slight irregularity near the greater trochanter.  XR Shoulder Right  Result Date: 03/13/2021 Bulky changes of the distal clavicle consistent with [facility] joint arthrosis.    PMFS History: Patient Active Problem List   Diagnosis Date Noted  . Multiple joint pain 02/10/2021  . Positive ANA (antinuclear antibody) 12/13/2020  . Trigger finger of left thumb 12/10/2020  . Lumbar radiculopathy 11/07/2020  . Chronic right-sided low back pain with right-sided sciatica 10/10/2020  . Sleep disturbance 10/10/2020  . Myalgia 10/10/2020  . Super obese 10/10/2020  . Abnormality of gait 10/10/2020  . Statin intolerance 09/17/2020  . Recurrent boils 09/17/2020  . Chronic bilateral low back pain 05/16/2020  . Postoperative state 12/27/2019  . Iron deficiency 08/03/2019  . B12 deficiency 06/30/2019  . Chronic right shoulder pain 07/05/2018  . Trochanteric bursitis of left hip 07/05/2018  . Morbid obesity ([place]) 07/05/2018  . Body mass index 45.0-49.9, adult ([place]) 07/05/2018  . Marijuana user 05/26/2018  . Status post total right knee replacement 01/12/2018  . Status post total hip replacement, left 09/22/2017  . Hyperlipidemia 08/10/2017  . Mild depression ([place]) 06/25/2017  . Essential hypertension 06/25/2017  . Tobacco abuse 06/25/2017  . Prediabetes 06/25/2017  . OSA on CPAP 06/25/2017  . Bronchitis 09/24/2016  . Leukocytosis 09/24/2016  . Radiculopathy, lumbar region 07/16/2016  . Degenerative lumbar disc 07/03/2016  . Synovitis of hip 04/20/2016  . Screening for breast cancer 12/28/2014  . Pain in joint involving lower leg 05/13/2013   Past Medical History:  Diagnosis Date  . Anemia   . Anxiety   . Arthritis   . B12 deficiency 06/30/2019  . Depression   . GERD (gastroesophageal reflux disease)    occasionally takes OTC  . History of leukocytosis   . Hyperlipidemia   . Hypertension   . Low back pain   . Obesity   . Pain in right buttock    Goes down to Right leg  to lateral aspect of calf.  . Pre-diabetes   . Sleep apnea    tested 2014 - unable to tolerate machine  . Spondylolisthesis at L4-L5 level     Family History  Problem Relation Age of Onset  . Hypertension Mother   . Diabetes Mother   . Breast cancer Mother 6  . Hypertension Father   . Lung cancer Maternal Aunt   . Colon polyps Brother   . Colon cancer Neg Hx   . Esophageal cancer Neg Hx   . Rectal cancer  Neg Hx   . Stomach cancer Neg Hx     Past Surgical History:  Procedure Laterality Date  . ABDOMINAL EXPOSURE N/A 01/22/2021   Procedure: LATERAL EXPOSURE FOR OBLIQUE LATERAL INTERBODY FUSION;  Surgeon: Marty Heck, MD;  Location: [place];  Service: Vascular;  Laterality: N/A;  . ABDOMINAL HYSTERECTOMY    . ANTERIOR LUMBAR FUSION N/A 01/22/2021   Procedure: Oblique Lateral Interbody Fusion Lumbar Four-Lumbar Five;  Surgeon: Vallarie Mare, MD;  Location: [place];  Service: Neurosurgery;  Laterality: N/A;  . LEFT HEART CATH AND CORONARY ANGIOGRAPHY N/A 10/25/2018   Procedure: LEFT HEART CATH AND CORONARY ANGIOGRAPHY;  Surgeon: Martinique, Peter M, MD;  Location: [place] [facility];  Service: Cardiovascular;  Laterality: N/A;  . LUMBAR PERCUTANEOUS PEDICLE SCREW 1 LEVEL N/A 01/22/2021   Procedure: LUMBAR PERCUTANEOUS PEDICLE SCREW LUBAR FOUR- FIVE;  Surgeon: Vallarie Mare, MD;  Location: [place];  Service: Neurosurgery;  Laterality: N/A;  . ROBOTIC ASSISTED LAPAROSCOPIC HYSTERECTOMY AND SALPINGECTOMY Bilateral 12/27/2019   Procedure: XI ROBOTIC ASSISTED TOTAL LAPAROSCOPIC HYSTERECTOMY AND SALPINGECTOMY;  Surgeon: Princess Bruins, MD;  Location: [place];  Service: Gynecology;  Laterality: Bilateral;  . TOTAL HIP ARTHROPLASTY Left 09/22/2017   Procedure: LEFT TOTAL HIP ARTHROPLASTY ANTERIOR APPROACH;  Surgeon: Leandrew Koyanagi, MD;  Location: [place];  Service: Orthopedics;  Laterality: Left;  . TOTAL KNEE ARTHROPLASTY Right 01/12/2018   Procedure: RIGHT TOTAL KNEE  ARTHROPLASTY;  Surgeon: Leandrew Koyanagi, MD;  Location: [place];  Service: Orthopedics;  Laterality: Right;  . TUBAL LIGATION     Social History   Occupational History  . Occupation: unemployed  Tobacco Use  . Smoking status: Current Every Day Smoker    Packs/day: 0.25    Years: 30.00    Pack years: 7.50    Types: Cigarettes  . Smokeless tobacco: Never Used  . Tobacco comment: Smokes 2 cigarettes per day  Vaping Use  . Vaping Use: Never used  Substance and Sexual Activity  . Alcohol use: Yes    Alcohol/week: 4.0 - 6.0 standard drinks    Types: 4 - 6 Shots of liquor per week    Comment: occ  . Drug use: Yes    Frequency: 2.0 times per week    Types: Marijuana  . Sexual activity: Yes    Birth control/protection: None    Comment: declined insurance questions,des neg

## 2021-03-18 ENCOUNTER — Other Ambulatory Visit: Payer: Self-pay

## 2021-03-18 ENCOUNTER — Ambulatory Visit: Payer: Medicare Other | Attending: Internal Medicine | Admitting: Internal Medicine

## 2021-03-18 ENCOUNTER — Encounter: Payer: Self-pay | Admitting: Internal Medicine

## 2021-03-18 VITALS — BP 117/77 | HR 93 | Resp 16 | Wt 285.4 lb

## 2021-03-18 DIAGNOSIS — I10 Essential (primary) hypertension: Secondary | ICD-10-CM | POA: Diagnosis not present

## 2021-03-18 DIAGNOSIS — F17200 Nicotine dependence, unspecified, uncomplicated: Secondary | ICD-10-CM

## 2021-03-18 DIAGNOSIS — R7303 Prediabetes: Secondary | ICD-10-CM

## 2021-03-18 DIAGNOSIS — E782 Mixed hyperlipidemia: Secondary | ICD-10-CM

## 2021-03-18 DIAGNOSIS — Z789 Other specified health status: Secondary | ICD-10-CM | POA: Diagnosis not present

## 2021-03-18 NOTE — Progress Notes (Signed)
Patient ID: Sarah Randall, female    DOB: [birth year]  MRN: [id number]  CC: chronic ds management  Subjective: Sarah Randall is a 55 y.o. female who presents for chronic ds management Her concerns today include:  Pt with hx of HTN,HL (on statin 2 x a wk -cramps),pre-DM, tob abuse, OSA has CPAP but not using, obesity,GERD,depression,IDA, B12 def(Dr. Feng),obesity,DDD oflumbar spine, OA RT hip and LT knees/p jt replacements,  Had surgery on lower back 01/2021 by neurosurgeon Dr. Marcello Randall.  Doing better. Has a little pain LT hip.  Saw Dr. Erlinda Randall.  Dx with bursitis. Declined steroid inj -starting next wk, she will be doing water therapy at the [facility] Currently taking: see medication list.  She is on hydrochlorothiazide and Cozaar Med Adherence: [x]  Yes    []  No Medication side effects: []  Yes    [x]  No Adherence with salt restriction: [x]  Yes    []  No Home Monitoring?: [x]  Yes    []  No Monitoring Frequency: occasionally Home BP results range: []  Yes    []  No SOB? []  Yes    [x]  No Chest Pain?: []  Yes    [x]  No Leg swelling?: []  Yes    [x]  No Headaches?: []  Yes    [x]  No Dizziness? []  Yes    [x]  No Comments:   HL:  Intolerant of statins.  Referred to Lipid clinic on last visit with me in the fall after repeat lipid profile showed total cholesterol greater than 300 and LDL cholesterol greater than 200.  Patient reports being called but decline appointment at that time as she was exposed to Covid.  Obesity/PreDM:  Eating more fruits.  Drinks water with [employer].  Loves red meat and fried foods.  Tob dep: 3-4 cigarettes a day.  Not ready to quit.   Patient Active Problem List   Diagnosis Date Noted  . Multiple joint pain 02/10/2021  . Positive ANA (antinuclear antibody) 12/13/2020  . Trigger finger of left thumb 12/10/2020  . Lumbar radiculopathy 11/07/2020  . Chronic right-sided low back pain with right-sided sciatica 10/10/2020  . Sleep disturbance 10/10/2020   . Myalgia 10/10/2020  . Super obese 10/10/2020  . Abnormality of gait 10/10/2020  . Statin intolerance 09/17/2020  . Recurrent boils 09/17/2020  . Chronic bilateral low back pain 05/16/2020  . Postoperative state 12/27/2019  . Iron deficiency 08/03/2019  . B12 deficiency 06/30/2019  . Chronic right shoulder pain 07/05/2018  . Trochanteric bursitis of left hip 07/05/2018  . Morbid obesity ([place]) 07/05/2018  . Body mass index 45.0-49.9, adult ([place]) 07/05/2018  . Marijuana user 05/26/2018  . Status post total right knee replacement 01/12/2018  . Status post total hip replacement, left 09/22/2017  . Hyperlipidemia 08/10/2017  . Mild depression ([place]) 06/25/2017  . Essential hypertension 06/25/2017  . Tobacco abuse 06/25/2017  . Prediabetes 06/25/2017  . OSA on CPAP 06/25/2017  . Bronchitis 09/24/2016  . Leukocytosis 09/24/2016  . Radiculopathy, lumbar region 07/16/2016  . Degenerative lumbar disc 07/03/2016  . Synovitis of hip 04/20/2016  . Screening for breast cancer 12/28/2014  . Pain in joint involving lower leg 05/13/2013     Current Outpatient Medications on File Prior to Visit  Medication Sig Dispense Refill  . esomeprazole (NEXIUM) 40 MG capsule TAKE 1 CAPSULE BY MOUTH EVERY DAY 90 capsule 1  . hydrochlorothiazide (HYDRODIURIL) 25 MG tablet TAKE 1 TABLET BY MOUTH EVERY DAY 30 tablet 1  . losartan (COZAAR) 50 MG tablet TAKE 1 TABLET  BY MOUTH EVERY DAY 30 tablet 0  . meloxicam (MOBIC) 7.5 MG tablet Take 1 tablet (7.5 mg total) by mouth 2 (two) times daily as needed for pain. 30 tablet 2  . methocarbamol (ROBAXIN) 500 MG tablet Take 2 tablets (1,000 mg total) by mouth every 6 (six) hours as needed for muscle spasms. 240 tablet 1  . nitroGLYCERIN (NITROSTAT) 0.4 MG SL tablet Place 1 tablet (0.4 mg total) under the tongue every 5 (five) minutes as needed. 25 tablet 11  . oxyCODONE-acetaminophen (PERCOCET/ROXICET) 5-325 MG tablet Take 1-2 tablets by mouth every 6 (six) hours as  needed for moderate pain or severe pain. 45 tablet 0  . propranolol (INDERAL) 10 MG tablet TAKE 1 TABLET BY MOUTH TWICE A DAY 60 tablet 0   No current facility-administered medications on file prior to visit.    Allergies  Allergen Reactions  . Pregabalin Swelling  . Cymbalta [Duloxetine Hcl] Nausea Only  . Gabapentin Swelling  . Lipitor [Atorvastatin] Other (See Comments)    Stabbing pains in legs  . Pravachol [Pravastatin]     Sharp pains in LT leg  . Zoloft [Sertraline Hcl] Other (See Comments)    tremors    Social History   Socioeconomic History  . Marital status: Single    Spouse name: Not on file  . Number of children: 2  . Years of education: 9  . Highest education level: Not on file  Occupational History  . Occupation: unemployed  Tobacco Use  . Smoking status: Current Every Day Smoker    Packs/day: 0.25    Years: 30.00    Pack years: 7.50    Types: Cigarettes  . Smokeless tobacco: Never Used  . Tobacco comment: Smokes 2 cigarettes per day  Vaping Use  . Vaping Use: Never used  Substance and Sexual Activity  . Alcohol use: Yes    Alcohol/week: 4.0 - 6.0 standard drinks    Types: 4 - 6 Shots of liquor per week    Comment: occ  . Drug use: Yes    Frequency: 2.0 times per week    Types: Marijuana  . Sexual activity: Yes    Birth control/protection: None    Comment: declined insurance questions,des neg  Other Topics Concern  . Not on file  Social History Narrative  . Not on file   Social Determinants of Health   Financial Resource Strain: Not on file  Food Insecurity: Not on file  Transportation Needs: Not on file  Physical Activity: Not on file  Stress: Not on file  Social Connections: Not on file  Intimate Partner Violence: Not on file    Family History  Problem Relation Age of Onset  . Hypertension Mother   . Diabetes Mother   . Breast cancer Mother 20  . Hypertension Father   . Lung cancer Maternal Aunt   . Colon polyps Brother   .  Colon cancer Neg Hx   . Esophageal cancer Neg Hx   . Rectal cancer Neg Hx   . Stomach cancer Neg Hx     Past Surgical History:  Procedure Laterality Date  . ABDOMINAL EXPOSURE N/A 01/22/2021   Procedure: LATERAL EXPOSURE FOR OBLIQUE LATERAL INTERBODY FUSION;  Surgeon: Marty Heck, MD;  Location: [facility];  Service: Vascular;  Laterality: N/A;  . ABDOMINAL HYSTERECTOMY    . ANTERIOR LUMBAR FUSION N/A 01/22/2021   Procedure: Oblique Lateral Interbody Fusion Lumbar Four-Lumbar Five;  Surgeon: Vallarie Mare, MD;  Location: [facility];  Service: Neurosurgery;  Laterality: N/A;  . LEFT HEART CATH AND CORONARY ANGIOGRAPHY N/A 10/25/2018   Procedure: LEFT HEART CATH AND CORONARY ANGIOGRAPHY;  Surgeon: Martinique, Peter M, MD;  Location: [facility];  Service: Cardiovascular;  Laterality: N/A;  . LUMBAR PERCUTANEOUS PEDICLE SCREW 1 LEVEL N/A 01/22/2021   Procedure: LUMBAR PERCUTANEOUS PEDICLE SCREW LUBAR FOUR- FIVE;  Surgeon: Vallarie Mare, MD;  Location: [facility];  Service: Neurosurgery;  Laterality: N/A;  . ROBOTIC ASSISTED LAPAROSCOPIC HYSTERECTOMY AND SALPINGECTOMY Bilateral 12/27/2019   Procedure: XI ROBOTIC ASSISTED TOTAL LAPAROSCOPIC HYSTERECTOMY AND SALPINGECTOMY;  Surgeon: Princess Bruins, MD;  Location: [facility];  Service: Gynecology;  Laterality: Bilateral;  . TOTAL HIP ARTHROPLASTY Left 09/22/2017   Procedure: LEFT TOTAL HIP ARTHROPLASTY ANTERIOR APPROACH;  Surgeon: Leandrew Koyanagi, MD;  Location: [facility];  Service: Orthopedics;  Laterality: Left;  . TOTAL KNEE ARTHROPLASTY Right 01/12/2018   Procedure: RIGHT TOTAL KNEE ARTHROPLASTY;  Surgeon: Leandrew Koyanagi, MD;  Location: [place] [facility] [facility];  Service: Orthopedics;  Laterality: Right;  . TUBAL LIGATION      ROS: Review of Systems Negative except as stated above  PHYSICAL EXAM: BP 117/77   Pulse 93   Resp 16   Wt 285 lb 6.4 oz (129.5 kg)   LMP  (LMP Unknown)   SpO2 95%   BMI 48.99 kg/m   Wt Readings from Last 3  Encounters:  03/18/21 285 lb 6.4 oz (129.5 kg)  03/13/21 280 lb (127 kg)  02/10/21 290 lb 9.6 oz (131.8 kg)    Physical Exam  General appearance - alert, well appearing, middle-aged older African-American female and in no distress Mental status - normal mood, behavior, speech, dress, motor activity, and thought processes Neck - supple, no significant adenopathy Chest - clear to auscultation, no wheezes, rales or rhonchi, symmetric air entry Heart - normal rate, regular rhythm, normal S1, S2, no murmurs, rubs, clicks or gallops Extremities - peripheral pulses normal, no pedal edema, no clubbing or cyanosis   CMP Latest Ref Rng & Units 01/22/2021 01/22/2021 01/17/2021  Glucose 70 - 99 mg/dL - - 100(H)  BUN 6 - 20 mg/dL - - 12  Creatinine 0.44 - 1.00 mg/dL - - 0.97  Sodium 135 - 145 mmol/L 133(L) 135 135  Potassium 3.5 - 5.1 mmol/L 3.9 3.1(L) 3.2(L)  Chloride 98 - 111 mmol/L - - 99  CO2 22 - 32 mmol/L - - 26  Calcium 8.9 - 10.3 mg/dL - - 9.0  Total Protein 6.0 - 8.5 g/dL - - -  Total Bilirubin 0.0 - 1.2 mg/dL - - -  Alkaline Phos 44 - 121 IU/L - - -  AST 0 - 40 IU/L - - -  ALT 0 - 32 IU/L - - -   Lipid Panel     Component Value Date/Time   CHOL 318 (H) 09/16/2020 1445   TRIG 231 (H) 09/16/2020 1445   HDL 41 09/16/2020 1445   CHOLHDL 7.8 (H) 09/16/2020 1445   LDLCALC 230 (H) 09/16/2020 1445    CBC    Component Value Date/Time   WBC 15.3 (H) 01/17/2021 1505   RBC 4.69 01/17/2021 1505   HGB 13.9 01/22/2021 1335   HGB 14.9 11/24/2019 1339   HGB 14.9 09/07/2019 1601   HCT 41.0 01/22/2021 1335   HCT 44.2 09/07/2019 1601   PLT 452 (H) 01/17/2021 1505   PLT 330 11/24/2019 1339   PLT 420 11/14/2018 1546   MCV 92.8 01/17/2021 1505   MCV  91 11/14/2018 1546   MCH 31.3 01/17/2021 1505   MCHC 33.8 01/17/2021 1505   RDW 13.4 01/17/2021 1505   RDW 12.7 11/14/2018 1546   LYMPHSABS 5.3 (H) 11/24/2019 1339   LYMPHSABS 5.5 (H) 11/14/2018 1546   MONOABS 0.7 11/24/2019 1339    EOSABS 0.3 11/24/2019 1339   EOSABS 0.3 11/14/2018 1546   BASOSABS 0.1 11/24/2019 1339   BASOSABS 0.1 11/14/2018 1546    ASSESSMENT AND PLAN: 1. Essential hypertension At goal.  Continue current medications and low-salt diet  2. Morbid obesity ([place]) Dietary counseling given.  Advised to eat more lean white meat like chicken, [place] and seafood rather than red meat or pork, advised to cut out the fried foods, cut back on portion sizes of white carbohydrates and incorporate fresh fruits and vegetables into the diet.  She plans to start some water aerobics at the [facility].  I told her to make sure and check with the neurosurgeon to make sure she is cleared to do so given her recent back surgery.  She is interested in pursuing medical weight management.  Referral submitted - Amb Ref to Medical Weight Management  3. Mixed hyperlipidemia She has been intolerant of statins due to muscle pain and cramps.  We have tried her on several even with every other day dosing.  We will recheck lipid profile today.  If still significantly elevated, she is agreeable to being referred to lipid clinic - Lipid panel  4. Tobacco dependence Advised to quit.  She is aware of health risks associated with smoking.  She is not ready to give a trial of quitting.  5. Prediabetes See #2 above - Hemoglobin A1c  6. Statin intolerance See #3 above   Patient was given the opportunity to ask questions.  Patient verbalized understanding of the plan and was able to repeat key elements of the plan.   Orders Placed This Encounter  Procedures  . Lipid panel  . Hemoglobin A1c  . Amb Ref to Medical Weight Management     Requested Prescriptions    No prescriptions requested or ordered in this encounter    Return in about 5 weeks (around 04/22/2021) for  give f/u in 5 wks for Medicare Wellness Visit.  Karle Plumber, MD, FACP

## 2021-03-18 NOTE — Patient Instructions (Signed)
Healthy Eating Following a healthy eating pattern may help you to achieve and maintain a healthy body weight, reduce the risk of chronic disease, and live a long and productive life. It is important to follow a healthy eating pattern at an appropriate calorie level for your body. Your nutritional needs should be met primarily through food by choosing a variety of nutrient-rich foods. What are tips for following this plan? Reading food labels  Read labels and choose the following: ? Reduced or low sodium. ? Juices with 100% fruit juice. ? Foods with low saturated fats and high polyunsaturated and monounsaturated fats. ? Foods with whole grains, such as whole wheat, cracked wheat, brown rice, and wild rice. ? Whole grains that are fortified with folic acid. This is recommended for women who are pregnant or who want to become pregnant.  Read labels and avoid the following: ? Foods with a lot of added sugars. These include foods that contain brown sugar, corn sweetener, corn syrup, dextrose, fructose, glucose, high-fructose corn syrup, honey, invert sugar, lactose, malt syrup, maltose, molasses, raw sugar, sucrose, trehalose, or turbinado sugar.  Do not eat more than the following amounts of added sugar per day:  6 teaspoons (25 g) for women.  9 teaspoons (38 g) for men. ? Foods that contain processed or refined starches and grains. ? Refined grain products, such as white flour, degermed cornmeal, white bread, and white rice. Shopping  Choose nutrient-rich snacks, such as vegetables, whole fruits, and nuts. Avoid high-calorie and high-sugar snacks, such as potato chips, fruit snacks, and candy.  Use oil-based dressings and spreads on foods instead of solid fats such as butter, stick margarine, or cream cheese.  Limit pre-made sauces, mixes, and "instant" products such as flavored rice, instant noodles, and ready-made pasta.  Try more plant-protein sources, such as tofu, tempeh, black beans,  edamame, lentils, nuts, and seeds.  Explore eating plans such as the Mediterranean diet or vegetarian diet. Cooking  Use oil to saut or stir-fry foods instead of solid fats such as butter, stick margarine, or lard.  Try baking, boiling, grilling, or broiling instead of frying.  Remove the fatty part of meats before cooking.  Steam vegetables in water or broth. Meal planning  At meals, imagine dividing your plate into fourths: ? One-half of your plate is fruits and vegetables. ? One-fourth of your plate is whole grains. ? One-fourth of your plate is protein, especially lean meats, poultry, eggs, tofu, beans, or nuts.  Include low-fat dairy as part of your daily diet.   Lifestyle  Choose healthy options in all settings, including home, work, school, restaurants, or stores.  Prepare your food safely: ? Wash your hands after handling raw meats. ? Keep food preparation surfaces clean by regularly washing with hot, soapy water. ? Keep raw meats separate from ready-to-eat foods, such as fruits and vegetables. ? Cook seafood, meat, poultry, and eggs to the recommended internal temperature. ? Store foods at safe temperatures. In general:  Keep cold foods at 7F (4.4C) or below.  Keep hot foods at 17F (60C) or above.  Keep your freezer at [facility] (-17.8C) or below.  Foods are no longer safe to eat when they have been between the temperatures of 40-17F (4.4-60C) for more than 2 hours. What foods should I eat? Fruits Aim to eat 2 cup-equivalents of fresh, canned (in natural juice), or frozen fruits each day. Examples of 1 cup-equivalent of fruit include 1 small apple, 8 large strawberries, 1 cup canned fruit,  cup dried fruit, or 1 cup 100% juice. Vegetables Aim to eat 2-3 cup-equivalents of fresh and frozen vegetables each day, including different varieties and colors. Examples of 1 cup-equivalent of vegetables include 2 medium carrots, 2 cups raw, leafy greens, 1 cup chopped  vegetable (raw or cooked), or 1 medium baked potato. Grains Aim to eat 6 ounce-equivalents of whole grains each day. Examples of 1 ounce-equivalent of grains include 1 slice of bread, 1 cup ready-to-eat cereal, 3 cups popcorn, or  cup cooked rice, pasta, or cereal. Meats and other proteins Aim to eat 5-6 ounce-equivalents of protein each day. Examples of 1 ounce-equivalent of protein include 1 egg, 1/2 cup nuts or seeds, or 1 tablespoon (16 g) peanut butter. A cut of meat or fish that is the size of a deck of cards is about 3-4 ounce-equivalents.  Of the protein you eat each week, try to have at least 8 ounces come from seafood. This includes salmon, trout, herring, and anchovies. Dairy Aim to eat 3 cup-equivalents of fat-free or low-fat dairy each day. Examples of 1 cup-equivalent of dairy include 1 cup (240 mL) milk, 8 ounces (250 g) yogurt, 1 ounces (44 g) natural cheese, or 1 cup (240 mL) fortified soy milk. Fats and oils  Aim for about 5 teaspoons (21 g) per day. Choose monounsaturated fats, such as canola and olive oils, avocados, peanut butter, and most nuts, or polyunsaturated fats, such as sunflower, corn, and soybean oils, walnuts, pine nuts, sesame seeds, sunflower seeds, and flaxseed. Beverages  Aim for six 8-oz glasses of water per day. Limit coffee to three to five 8-oz cups per day.  Limit caffeinated beverages that have added calories, such as soda and energy drinks.  Limit alcohol intake to no more than 1 drink a day for nonpregnant women and 2 drinks a day for men. One drink equals 12 oz of beer (355 mL), 5 oz of wine (148 mL), or 1 oz of hard liquor (44 mL). Seasoning and other foods  Avoid adding excess amounts of salt to your foods. Try flavoring foods with herbs and spices instead of salt.  Avoid adding sugar to foods.  Try using oil-based dressings, sauces, and spreads instead of solid fats. This information is based on general [place] nutrition guidelines. For more  information, visit choosemyplate.gov. Exact amounts may vary based on your nutrition needs. Summary  A healthy eating plan may help you to maintain a healthy weight, reduce the risk of chronic diseases, and stay active throughout your life.  Plan your meals. Make sure you eat the right portions of a variety of nutrient-rich foods.  Try baking, boiling, grilling, or broiling instead of frying.  Choose healthy options in all settings, including home, work, school, restaurants, or stores. This information is not intended to replace advice given to you by your health care provider. Make sure you discuss any questions you have with your health care provider. Document Revised: 03/07/2018 Document Reviewed: 03/07/2018 Elsevier Patient Education  2021 [employer]  

## 2021-03-19 ENCOUNTER — Other Ambulatory Visit: Payer: Self-pay | Admitting: Internal Medicine

## 2021-03-19 DIAGNOSIS — E782 Mixed hyperlipidemia: Secondary | ICD-10-CM

## 2021-03-19 LAB — LIPID PANEL
Chol/HDL Ratio: 7.3 ratio — ABNORMAL HIGH (ref 0.0–4.4)
Cholesterol, Total: 284 mg/dL — ABNORMAL HIGH (ref 100–199)
HDL: 39 mg/dL — ABNORMAL LOW (ref >39)
LDL Chol Calc (NIH): 208 mg/dL — ABNORMAL HIGH (ref 0–99)
Triglycerides: 192 mg/dL — ABNORMAL HIGH (ref 0–149)
VLDL Cholesterol Cal: 37 mg/dL (ref 5–40)

## 2021-03-19 LAB — HEMOGLOBIN A1C
Est. average glucose Bld gHb Est-mCnc: 140 mg/dL
Hgb A1c MFr Bld: 6.5 % — ABNORMAL HIGH (ref 4.8–5.6)

## 2021-03-20 ENCOUNTER — Other Ambulatory Visit: Payer: Self-pay

## 2021-03-20 ENCOUNTER — Ambulatory Visit (INDEPENDENT_AMBULATORY_CARE_PROVIDER_SITE_OTHER): Payer: Medicare Other | Admitting: Orthopaedic Surgery

## 2021-03-20 ENCOUNTER — Ambulatory Visit: Payer: Self-pay

## 2021-03-20 ENCOUNTER — Encounter: Payer: Self-pay | Admitting: Orthopaedic Surgery

## 2021-03-20 DIAGNOSIS — M25552 Pain in left hip: Secondary | ICD-10-CM

## 2021-03-20 NOTE — Progress Notes (Signed)
Office Visit Note   Patient: Sarah Randall           Date of Birth: [birth year]           MRN: [id number] Visit Date: 03/20/2021              Requested by: Ladell Pier, MD [address] [place],  [place] [address] PCP: Ladell Pier, MD   Assessment & Plan: Visit Diagnoses:  1. Pain in left hip     Plan: Impression is left lateral hip pain.  We will send her upstairs to have this injected under ultrasound by Dr. Junius Roads.  We will see her back as needed.  Follow-Up Instructions: Return if symptoms worsen or fail to improve.   Orders:  Orders Placed This Encounter  Procedures  . US Guided Needle Placement - No Linked Charges   No orders of the defined types were placed in this encounter.     Procedures: No procedures performed   Clinical Data: No additional findings.   Subjective: Chief Complaint  Patient presents with  . Left Hip - Pain    Sarah Randall returns today for continued left lateral hip pain.  She changed her mind and would like to have a cortisone injection.  She is unable to sleep on the left side.   Review of Systems   Objective: Vital Signs: LMP  (LMP Unknown)   Physical Exam  Ortho Exam Left hip exam is unchanged.  Tender to palpation over the lateral greater trochanter. Specialty Comments:  No specialty comments available.  Imaging: US Guided Needle Placement - No Linked Charges  Result Date: 03/20/2021 Ultrasound-guided left greater trochanter injection: After sterile prep with Betadine, injected 4 cc 0.25% bupivacaine and 40 mg Depo-Medrol into the gluteus medius tendon insertion at the greater trochanter under ultrasound guidance.    PMFS History: Patient Active Problem List   Diagnosis Date Noted  . Multiple joint pain 02/10/2021  . Positive ANA (antinuclear antibody) 12/13/2020  . Trigger finger of left thumb 12/10/2020  . Lumbar radiculopathy 11/07/2020  . Chronic right-sided low back pain with right-sided sciatica  10/10/2020  . Sleep disturbance 10/10/2020  . Myalgia 10/10/2020  . Super obese 10/10/2020  . Abnormality of gait 10/10/2020  . Statin intolerance 09/17/2020  . Recurrent boils 09/17/2020  . Chronic bilateral low back pain 05/16/2020  . Postoperative state 12/27/2019  . Iron deficiency 08/03/2019  . B12 deficiency 06/30/2019  . Chronic right shoulder pain 07/05/2018  . Trochanteric bursitis of left hip 07/05/2018  . Morbid obesity ([place]) 07/05/2018  . Body mass index 45.0-49.9, adult ([place]) 07/05/2018  . Marijuana user 05/26/2018  . Status post total right knee replacement 01/12/2018  . Status post total hip replacement, left 09/22/2017  . Hyperlipidemia 08/10/2017  . Mild depression ([place]) 06/25/2017  . Essential hypertension 06/25/2017  . Tobacco abuse 06/25/2017  . Prediabetes 06/25/2017  . OSA on CPAP 06/25/2017  . Bronchitis 09/24/2016  . Leukocytosis 09/24/2016  . Radiculopathy, lumbar region 07/16/2016  . Degenerative lumbar disc 07/03/2016  . Synovitis of hip 04/20/2016  . Screening for breast cancer 12/28/2014  . Pain in joint involving lower leg 05/13/2013   Past Medical History:  Diagnosis Date  . Anemia   . Anxiety   . Arthritis   . B12 deficiency 06/30/2019  . Depression   . GERD (gastroesophageal reflux disease)    occasionally takes OTC  . History of leukocytosis   . Hyperlipidemia   . Hypertension   .  Low back pain   . Obesity   . Pain in right buttock    Goes down to Right leg to lateral aspect of calf.  . Pre-diabetes   . Sleep apnea    tested 2014 - unable to tolerate machine  . Spondylolisthesis at L4-L5 level     Family History  Problem Relation Age of Onset  . Hypertension Mother   . Diabetes Mother   . Breast cancer Mother 7  . Hypertension Father   . Lung cancer Maternal Aunt   . Colon polyps Brother   . Colon cancer Neg Hx   . Esophageal cancer Neg Hx   . Rectal cancer Neg Hx   . Stomach cancer Neg Hx     Past Surgical History:   Procedure Laterality Date  . ABDOMINAL EXPOSURE N/A 01/22/2021   Procedure: LATERAL EXPOSURE FOR OBLIQUE LATERAL INTERBODY FUSION;  Surgeon: Marty Heck, MD;  Location: [facility];  Service: Vascular;  Laterality: N/A;  . ABDOMINAL HYSTERECTOMY    . ANTERIOR LUMBAR FUSION N/A 01/22/2021   Procedure: Oblique Lateral Interbody Fusion Lumbar Four-Lumbar Five;  Surgeon: Vallarie Mare, MD;  Location: [facility];  Service: Neurosurgery;  Laterality: N/A;  . LEFT HEART CATH AND CORONARY ANGIOGRAPHY N/A 10/25/2018   Procedure: LEFT HEART CATH AND CORONARY ANGIOGRAPHY;  Surgeon: Martinique, Peter M, MD;  Location: [facility];  Service: Cardiovascular;  Laterality: N/A;  . LUMBAR PERCUTANEOUS PEDICLE SCREW 1 LEVEL N/A 01/22/2021   Procedure: LUMBAR PERCUTANEOUS PEDICLE SCREW LUBAR FOUR- FIVE;  Surgeon: Vallarie Mare, MD;  Location: [facility];  Service: Neurosurgery;  Laterality: N/A;  . ROBOTIC ASSISTED LAPAROSCOPIC HYSTERECTOMY AND SALPINGECTOMY Bilateral 12/27/2019   Procedure: XI ROBOTIC ASSISTED TOTAL LAPAROSCOPIC HYSTERECTOMY AND SALPINGECTOMY;  Surgeon: Princess Bruins, MD;  Location: [facility];  Service: Gynecology;  Laterality: Bilateral;  . TOTAL HIP ARTHROPLASTY Left 09/22/2017   Procedure: LEFT TOTAL HIP ARTHROPLASTY ANTERIOR APPROACH;  Surgeon: Leandrew Koyanagi, MD;  Location: [facility];  Service: Orthopedics;  Laterality: Left;  . TOTAL KNEE ARTHROPLASTY Right 01/12/2018   Procedure: RIGHT TOTAL KNEE ARTHROPLASTY;  Surgeon: Leandrew Koyanagi, MD;  Location: [facility];  Service: Orthopedics;  Laterality: Right;  . TUBAL LIGATION     Social History   Occupational History  . Occupation: unemployed  Tobacco Use  . Smoking status: Current Every Day Smoker    Packs/day: 0.25    Years: 30.00    Pack years: 7.50    Types: Cigarettes  . Smokeless tobacco: Never Used  . Tobacco comment: Smokes 2 cigarettes per day  Vaping Use  . Vaping Use: Never used  Substance and Sexual Activity   . Alcohol use: Yes    Alcohol/week: 4.0 - 6.0 standard drinks    Types: 4 - 6 Shots of liquor per week    Comment: occ  . Drug use: Yes    Frequency: 2.0 times per week    Types: Marijuana  . Sexual activity: Yes    Birth control/protection: None    Comment: declined insurance questions,des neg

## 2021-03-20 NOTE — Progress Notes (Signed)
Subjective: She is here for ultrasound-guided left greater trochanter injection.  Ultrasound is used due to patient's large body habitus.  Objective: She is point tender to palpation over the greater trochanter.  Procedure: Ultrasound-guided left greater trochanter injection: After sterile prep with Betadine, injected 4 cc 0.25% bupivacaine and 40 mg Depo-Medrol into the gluteus medius tendon insertion at the greater trochanter under ultrasound guidance.

## 2021-03-26 ENCOUNTER — Other Ambulatory Visit: Payer: Self-pay | Admitting: Internal Medicine

## 2021-03-26 DIAGNOSIS — G250 Essential tremor: Secondary | ICD-10-CM

## 2021-03-26 DIAGNOSIS — G43011 Migraine without aura, intractable, with status migrainosus: Secondary | ICD-10-CM

## 2021-04-03 ENCOUNTER — Other Ambulatory Visit: Payer: Self-pay | Admitting: Internal Medicine

## 2021-04-03 DIAGNOSIS — I10 Essential (primary) hypertension: Secondary | ICD-10-CM

## 2021-04-03 NOTE — Telephone Encounter (Signed)
Requested Prescriptions  Pending Prescriptions Disp Refills  . hydrochlorothiazide (HYDRODIURIL) 25 MG tablet [Pharmacy Med Name: HYDROCHLOROTHIAZIDE 25 MG TAB] 60 tablet 0    Sig: TAKE 1 TABLET BY MOUTH EVERY DAY     Cardiovascular: Diuretics - Thiazide Failed - 04/03/2021  1:33 PM      Failed - Ca in normal range and within 360 days    Calcium  Date Value Ref Range Status  01/17/2021 9.0 8.9 - 10.3 mg/dL Final   Calcium, Ion  Date Value Ref Range Status  01/22/2021 1.11 (L) 1.15 - 1.40 mmol/L Final         Failed - Na in normal range and within 360 days    Sodium  Date Value Ref Range Status  01/22/2021 133 (L) 135 - 145 mmol/L Final  06/20/2019 138 134 - 144 mmol/L Final         Passed - Cr in normal range and within 360 days    Creatinine  Date Value Ref Range Status  06/15/2019 0.94 0.44 - 1.00 mg/dL Final  08/10/2017 150.1 20.0 - 300.0 mg/dL Final   Creatinine, Ser  Date Value Ref Range Status  01/17/2021 0.97 0.44 - 1.00 mg/dL Final         Passed - K in normal range and within 360 days    Potassium  Date Value Ref Range Status  01/22/2021 3.9 3.5 - 5.1 mmol/L Final         Passed - Last BP in normal range    BP Readings from Last 1 Encounters:  03/18/21 117/77         Passed - Valid encounter within last 6 months    Recent Outpatient Visits          2 weeks ago Essential hypertension   [place], MD   6 months ago Need for influenza vaccination   [place], Stephen L, RPH-CPP   6 months ago Essential hypertension   [place], Deborah B, MD   1 year ago Rhinovirus   [place], [place], NP   1 year ago [place], Deborah B, MD      Future Appointments            In 3 months Hilty, Nadean Corwin, MD [facility], [facility]

## 2021-04-05 ENCOUNTER — Other Ambulatory Visit: Payer: Self-pay | Admitting: Internal Medicine

## 2021-04-05 DIAGNOSIS — I10 Essential (primary) hypertension: Secondary | ICD-10-CM

## 2021-04-05 DIAGNOSIS — G43011 Migraine without aura, intractable, with status migrainosus: Secondary | ICD-10-CM

## 2021-04-05 DIAGNOSIS — G250 Essential tremor: Secondary | ICD-10-CM

## 2021-04-05 NOTE — Telephone Encounter (Signed)
Requested Prescriptions  Pending Prescriptions Disp Refills  . hydrochlorothiazide (HYDRODIURIL) 25 MG tablet [Pharmacy Med Name: HYDROCHLOROTHIAZIDE 25 MG TAB] 90 tablet 1    Sig: TAKE 1 TABLET BY MOUTH EVERY DAY     Cardiovascular: Diuretics - Thiazide Failed - 04/05/2021 12:01 PM      Failed - Ca in normal range and within 360 days    Calcium  Date Value Ref Range Status  01/17/2021 9.0 8.9 - 10.3 mg/dL Final   Calcium, Ion  Date Value Ref Range Status  01/22/2021 1.11 (L) 1.15 - 1.40 mmol/L Final         Failed - Na in normal range and within 360 days    Sodium  Date Value Ref Range Status  01/22/2021 133 (L) 135 - 145 mmol/L Final  06/20/2019 138 134 - 144 mmol/L Final         Passed - Cr in normal range and within 360 days    Creatinine  Date Value Ref Range Status  06/15/2019 0.94 0.44 - 1.00 mg/dL Final  08/10/2017 150.1 20.0 - 300.0 mg/dL Final   Creatinine, Ser  Date Value Ref Range Status  01/17/2021 0.97 0.44 - 1.00 mg/dL Final         Passed - K in normal range and within 360 days    Potassium  Date Value Ref Range Status  01/22/2021 3.9 3.5 - 5.1 mmol/L Final         Passed - Last BP in normal range    BP Readings from Last 1 Encounters:  03/18/21 117/77         Passed - Valid encounter within last 6 months    Recent Outpatient Visits          2 weeks ago Essential hypertension   [place], MD   6 months ago Need for influenza vaccination   [place], Stephen L, RPH-CPP   6 months ago Essential hypertension   [place], Deborah B, MD   1 year ago Rhinovirus   [place], [place], NP   1 year ago Abscess   [place], Deborah B, MD      Future Appointments            In 3 months Hilty, Nadean Corwin, MD [facility], [facility]            . propranolol (INDERAL) 10 MG tablet [Pharmacy Med Name: PROPRANOLOL 10 MG TABLET] 180 tablet 1    Sig: TAKE 1 TABLET BY MOUTH TWICE A DAY     Cardiovascular:  Beta Blockers Passed - 04/05/2021 12:01 PM      Passed - Last BP in normal range    BP Readings from Last 1 Encounters:  03/18/21 117/77         Passed - Last Heart Rate in normal range    Pulse Readings from Last 1 Encounters:  03/18/21 93         Passed - Valid encounter within last 6 months    Recent Outpatient Visits          2 weeks ago Essential hypertension   [place], MD   6 months ago Need for influenza vaccination   [place], RPH-CPP  6 months ago Essential hypertension   [place], MD   1 year ago Rhinovirus   [place], [place], NP   1 year ago [place], MD      Future Appointments            In 3 months Hilty, Nadean Corwin, MD [facility], [facility]

## 2021-04-14 ENCOUNTER — Encounter: Payer: Medicare Other | Admitting: Physical Medicine & Rehabilitation

## 2021-04-14 DIAGNOSIS — M5416 Radiculopathy, lumbar region: Secondary | ICD-10-CM | POA: Diagnosis not present

## 2021-04-14 DIAGNOSIS — M4316 Spondylolisthesis, lumbar region: Secondary | ICD-10-CM | POA: Diagnosis not present

## 2021-04-14 DIAGNOSIS — I10 Essential (primary) hypertension: Secondary | ICD-10-CM | POA: Diagnosis not present

## 2021-04-16 ENCOUNTER — Other Ambulatory Visit: Payer: Self-pay | Admitting: Internal Medicine

## 2021-04-16 DIAGNOSIS — I10 Essential (primary) hypertension: Secondary | ICD-10-CM

## 2021-04-21 ENCOUNTER — Other Ambulatory Visit: Payer: Self-pay | Admitting: Neurosurgery

## 2021-05-02 ENCOUNTER — Other Ambulatory Visit: Payer: Self-pay | Admitting: Neurosurgery

## 2021-05-02 NOTE — Progress Notes (Signed)
Surgical Instructions    Your procedure is scheduled on June 2  Report to [facility] Main Entrance "A" at 0530 A.M., then check in with the Admitting office.  Call this number if you have problems the morning of surgery:  [phone]   If you have any questions prior to your surgery date call [phone]: Open Monday-Friday 8am-4pm    Remember:  Do not eat or drink after midnight the night before your surgery     Take these medicines the morning of surgery with A SIP OF WATER  esomeprazole (NEXIUM) propranolol (INDERAL)   As of today, STOP taking any Aspirin (unless otherwise instructed by your surgeon) Aleve, Naproxen, Ibuprofen, Motrin, Advil, Goody's, BC's, all herbal medications, fish oil, and all vitamins. meloxicam (MOBIC)                     Do not wear jewelry, make up, or nail polish DO Not wear nail polish, gel polish, artificial nails, or any other type of covering on natural nails including finger and toenails. If patients have artificial nails, gel coating, etc. that need to be removed by a nail saloon please have this removed prior to surgery or surgery may need to be canceled/delayed if the surgeon/ anesthesia feels like the patient is unable to be adequately monitored.            Do not wear lotions, powders, perfumes/colognes, or deodorant.            Do not shave 48 hours prior to surgery.  Men may shave face and neck.            Do not bring valuables to the hospital.            [facility] is not responsible for any belongings or valuables.  Do NOT Smoke (Tobacco/Vaping) or drink Alcohol 24 hours prior to your procedure If you use a CPAP at night, you may bring all equipment for your overnight stay.   Contacts, glasses, dentures or bridgework may not be worn into surgery, please bring cases for these belongings   For patients admitted to the hospital, discharge time will be determined by your treatment team.   Patients discharged the day of surgery will not  be allowed to drive home, and someone needs to stay with them for 24 hours.    Special instructions:    Oral Hygiene is also important to reduce your risk of infection.  Remember - BRUSH YOUR TEETH THE MORNING OF SURGERY WITH YOUR REGULAR TOOTHPASTE   [place]- Preparing For Surgery  Before surgery, you can play an important role. Because skin is not sterile, your skin needs to be as free of germs as possible. You can reduce the number of germs on your skin by washing with CHG (chlorahexidine gluconate) Soap before surgery.  CHG is an antiseptic cleaner which kills germs and bonds with the skin to continue killing germs even after washing.     Please do not use if you have an allergy to CHG or antibacterial soaps. If your skin becomes reddened/irritated stop using the CHG.  Do not shave (including legs and underarms) for at least 48 hours prior to first CHG shower. It is OK to shave your face.  Please follow these instructions carefully.    1.  Shower the NIGHT BEFORE SURGERY and the MORNING OF SURGERY with CHG Soap.   If you chose to wash your hair, wash your hair first as usual with  your normal shampoo. After you shampoo, rinse your hair and body thoroughly to remove the shampoo.  Then [employer] and genitals (private parts) with your normal soap and rinse thoroughly to remove soap.  2. After that Use CHG Soap as you would any other liquid soap. You can apply CHG directly to the skin and wash gently with a scrungie or a clean washcloth.   3. Apply the CHG Soap to your body ONLY FROM THE NECK DOWN.  Do not use on open wounds or open sores. Avoid contact with your eyes, ears, mouth and genitals (private parts). Wash Face and genitals (private parts)  with your normal soap.   4. Wash thoroughly, paying special attention to the area where your surgery will be performed.  5. Thoroughly rinse your body with warm water from the neck down.  6. DO NOT shower/wash with your normal soap after  using and rinsing off the CHG Soap.  7. Pat yourself dry with a CLEAN TOWEL.  8. Wear CLEAN PAJAMAS to bed the night before surgery  9. Place CLEAN SHEETS on your bed the night before your surgery  10. DO NOT SLEEP WITH PETS.   Day of Surgery:  Take a shower with CHG soap. Wear Clean/Comfortable clothing the morning of surgery Do not apply any deodorants/lotions.   Remember to brush your teeth WITH YOUR REGULAR TOOTHPASTE.   Please read over the following fact sheets that you were given.

## 2021-05-06 ENCOUNTER — Encounter (HOSPITAL_COMMUNITY): Payer: Self-pay

## 2021-05-06 ENCOUNTER — Encounter (HOSPITAL_COMMUNITY)
Admission: RE | Admit: 2021-05-06 | Discharge: 2021-05-06 | Disposition: A | Discharge status: Home / Self Care | Payer: Medicare Other | Source: Ambulatory Visit | Attending: Neurosurgery | Admitting: Neurosurgery

## 2021-05-06 ENCOUNTER — Other Ambulatory Visit: Payer: Self-pay

## 2021-05-06 DIAGNOSIS — Z79899 Other long term (current) drug therapy: Secondary | ICD-10-CM | POA: Diagnosis not present

## 2021-05-06 DIAGNOSIS — K219 Gastro-esophageal reflux disease without esophagitis: Secondary | ICD-10-CM | POA: Diagnosis not present

## 2021-05-06 DIAGNOSIS — G4733 Obstructive sleep apnea (adult) (pediatric): Secondary | ICD-10-CM | POA: Diagnosis not present

## 2021-05-06 DIAGNOSIS — M4316 Spondylolisthesis, lumbar region: Secondary | ICD-10-CM | POA: Diagnosis not present

## 2021-05-06 DIAGNOSIS — M5416 Radiculopathy, lumbar region: Secondary | ICD-10-CM | POA: Diagnosis not present

## 2021-05-06 DIAGNOSIS — Z9989 Dependence on other enabling machines and devices: Secondary | ICD-10-CM | POA: Diagnosis not present

## 2021-05-06 DIAGNOSIS — R7303 Prediabetes: Secondary | ICD-10-CM | POA: Diagnosis not present

## 2021-05-06 DIAGNOSIS — E538 Deficiency of other specified B group vitamins: Secondary | ICD-10-CM | POA: Diagnosis not present

## 2021-05-06 DIAGNOSIS — Z96651 Presence of right artificial knee joint: Secondary | ICD-10-CM | POA: Diagnosis not present

## 2021-05-06 DIAGNOSIS — Z20822 Contact with and (suspected) exposure to covid-19: Secondary | ICD-10-CM | POA: Insufficient documentation

## 2021-05-06 DIAGNOSIS — I10 Essential (primary) hypertension: Secondary | ICD-10-CM | POA: Diagnosis not present

## 2021-05-06 DIAGNOSIS — Z01812 Encounter for preprocedural laboratory examination: Secondary | ICD-10-CM | POA: Insufficient documentation

## 2021-05-06 DIAGNOSIS — M5116 Intervertebral disc disorders with radiculopathy, lumbar region: Secondary | ICD-10-CM | POA: Diagnosis not present

## 2021-05-06 DIAGNOSIS — Z96642 Presence of left artificial hip joint: Secondary | ICD-10-CM | POA: Diagnosis not present

## 2021-05-06 DIAGNOSIS — E785 Hyperlipidemia, unspecified: Secondary | ICD-10-CM | POA: Diagnosis not present

## 2021-05-06 DIAGNOSIS — F17210 Nicotine dependence, cigarettes, uncomplicated: Secondary | ICD-10-CM | POA: Diagnosis not present

## 2021-05-06 DIAGNOSIS — M4326 Fusion of spine, lumbar region: Secondary | ICD-10-CM | POA: Diagnosis not present

## 2021-05-06 DIAGNOSIS — Z981 Arthrodesis status: Secondary | ICD-10-CM | POA: Diagnosis not present

## 2021-05-06 LAB — BASIC METABOLIC PANEL
Anion gap: 10 (ref 5–15)
BUN: 8 mg/dL (ref 6–20)
CO2: 25 mmol/L (ref 22–32)
Calcium: 9.1 mg/dL (ref 8.9–10.3)
Chloride: 98 mmol/L (ref 98–111)
Creatinine, Ser: 0.81 mg/dL (ref 0.44–1.00)
GFR, Estimated: >60 mL/min (ref >60)
Glucose, Bld: 115 mg/dL — ABNORMAL HIGH (ref 70–99)
Potassium: 3.4 mmol/L — ABNORMAL LOW (ref 3.5–5.1)
Sodium: 133 mmol/L — ABNORMAL LOW (ref 135–145)

## 2021-05-06 LAB — TYPE AND SCREEN
ABO/RH(D): O POS
Antibody Screen: NEGATIVE

## 2021-05-06 LAB — CBC
HCT: 46 % (ref 36.0–46.0)
Hemoglobin: 15.3 g/dL — ABNORMAL HIGH (ref 12.0–15.0)
MCH: 30.4 pg (ref 26.0–34.0)
MCHC: 33.3 g/dL (ref 30.0–36.0)
MCV: 91.3 fL (ref 80.0–100.0)
Platelets: 437 10*3/uL — ABNORMAL HIGH (ref 150–400)
RBC: 5.04 MIL/uL (ref 3.87–5.11)
RDW: 13.3 % (ref 11.5–15.5)
WBC: 13.2 10*3/uL — ABNORMAL HIGH (ref 4.0–10.5)
nRBC: 0 % (ref 0.0–0.2)

## 2021-05-06 LAB — SARS CORONAVIRUS 2 (TAT 6-24 HRS): SARS Coronavirus 2: NEGATIVE

## 2021-05-06 LAB — GLUCOSE, CAPILLARY: Glucose-Capillary: 119 mg/dL — ABNORMAL HIGH (ref 70–99)

## 2021-05-06 LAB — SURGICAL PCR SCREEN
MRSA, PCR: NEGATIVE
Staphylococcus aureus: NEGATIVE

## 2021-05-06 NOTE — Progress Notes (Signed)
PCP - Dr. Karle Plumber Cardiologist - has appointment with [facility] as new patient to Dr. Debara Pickett for HLD  PPM/ICD - n/a Device Orders -  Rep Notified -   Chest x-ray - 09/06/20  EKG - 12/13/20 Stress Test - patient denies ECHO - patient denies Cardiac Cath - 10/25/18  Sleep Study - 2014, in [place], cannot remember where or who ordered it. CPAP - unable to tolerate  Fasting Blood Sugar - n/a Checks Blood Sugar _____ times a day  Blood Thinner Instructions: n/a Aspirin Instructions: n/a  ERAS Protcol - NPO per order PRE-SURGERY Ensure or G2-   COVID TEST- at PAT appointment 05/06/21   Anesthesia review: yes, previous anesthesia review  Patient denies shortness of breath, fever, cough and chest pain at PAT appointment   All instructions explained to the patient, with a verbal understanding of the material. Patient agrees to go over the instructions while at home for a better understanding. Patient also instructed to self quarantine after being tested for COVID-19. The opportunity to ask questions was provided.

## 2021-05-07 NOTE — Progress Notes (Signed)
Anesthesia Chart Review:   Case: [id number] Date/Time: 05/08/21 0715   Procedure: Revision of instrumentation and posterolateral fusion right L4-L5 (Right )   Anesthesia type: General   Pre-op diagnosis: Back pain   Location: [facility] OR ROOM 20 / [facility]   Surgeons: Vallarie Mare, MD      DISCUSSION: Pt is 55 years old with hx HTN, OSA (intolerant to CPAP), GERD, pre-diabetes, anemia, HLD, back pain, anxiety, depression, leukocytosis (hematology evaluation 2020, likely reactive). No significant CAD by 10/25/18 [facility]. BMI is consistent with morbid obesity. Current smoker.  S/p OLIF L4-L5 01/22/21.    VS: BP 120/86   Pulse 88   Temp (!) 36.2 C (Temporal)   Resp 16   Ht 5\' 4"  (1.626 m)   Wt 126.2 kg   LMP  (LMP Unknown)   SpO2 98%   BMI 47.77 kg/m   PROVIDERS: - PCP is Ladell Pier, MD. Last office visit 03/18/21 - Hematologist is Truitt Merle, MD - Has not routinely seen cardiology. Has new patient appt with Lyman Bishop, MD in August 2022 for hyperlipidemia for lipid management   LABS: Labs reviewed: Acceptable for surgery. (all labs ordered are listed, but only abnormal results are displayed)  Labs Reviewed  GLUCOSE, CAPILLARY - Abnormal; Notable for the following components:      Result Value   Glucose-Capillary 119 (*)    All other components within normal limits  BASIC METABOLIC PANEL - Abnormal; Notable for the following components:   Sodium 133 (*)    Potassium 3.4 (*)    Glucose, Bld 115 (*)    All other components within normal limits  CBC - Abnormal; Notable for the following components:   WBC 13.2 (*)    Hemoglobin 15.3 (*)    Platelets 437 (*)    All other components within normal limits  SURGICAL PCR SCREEN  SARS CORONAVIRUS 2 (TAT 6-24 HRS)  TYPE AND SCREEN     IMAGES: CXR 09/06/20: No active cardiopulmonary disease.   EKG 12/13/20: NSR   CV: Cardiac cath 10/25/18:  The left ventricular systolic function is normal.  LV end diastolic pressure is  normal.  The left ventricular ejection fraction is 55-65% by visual estimate. 1. No significant CAD(minimal luminal irregularities in LCx, RCA) 2. Normal LV function 3. Normal LVEDP   Past Medical History:  Diagnosis Date  . Anemia   . Anxiety   . Arthritis   . B12 deficiency 06/30/2019  . Depression   . GERD (gastroesophageal reflux disease)    occasionally takes OTC  . History of leukocytosis   . Hyperlipidemia   . Hypertension   . Low back pain   . Obesity   . Pain in right buttock    Goes down to Right leg to lateral aspect of calf.  . Pre-diabetes   . Sleep apnea    tested 2014 - unable to tolerate machine  . Spondylolisthesis at L4-L5 level     Past Surgical History:  Procedure Laterality Date  . ABDOMINAL EXPOSURE N/A 01/22/2021   Procedure: LATERAL EXPOSURE FOR OBLIQUE LATERAL INTERBODY FUSION;  Surgeon: Marty Heck, MD;  Location: [place];  Service: Vascular;  Laterality: N/A;  . ABDOMINAL HYSTERECTOMY    . ANTERIOR LUMBAR FUSION N/A 01/22/2021   Procedure: Oblique Lateral Interbody Fusion Lumbar Four-Lumbar Five;  Surgeon: Vallarie Mare, MD;  Location: [place];  Service: Neurosurgery;  Laterality: N/A;  . LEFT HEART CATH AND CORONARY ANGIOGRAPHY N/A 10/25/2018  Procedure: LEFT HEART CATH AND CORONARY ANGIOGRAPHY;  Surgeon: [place], Peter M, MD;  Location: [facility] [facility];  Service: Cardiovascular;  Laterality: N/A;  . LUMBAR PERCUTANEOUS PEDICLE SCREW 1 LEVEL N/A 01/22/2021   Procedure: LUMBAR PERCUTANEOUS PEDICLE SCREW LUBAR FOUR- FIVE;  Surgeon: Vallarie Mare, MD;  Location: [facility] [place];  Service: Neurosurgery;  Laterality: N/A;  . ROBOTIC ASSISTED LAPAROSCOPIC HYSTERECTOMY AND SALPINGECTOMY Bilateral 12/27/2019   Procedure: XI ROBOTIC ASSISTED TOTAL LAPAROSCOPIC HYSTERECTOMY AND SALPINGECTOMY;  Surgeon: Princess Bruins, MD;  Location: [place];  Service: Gynecology;  Laterality: Bilateral;  . TOTAL HIP ARTHROPLASTY Left 09/22/2017    Procedure: LEFT TOTAL HIP ARTHROPLASTY ANTERIOR APPROACH;  Surgeon: Leandrew Koyanagi, MD;  Location: [facility];  Service: Orthopedics;  Laterality: Left;  . TOTAL KNEE ARTHROPLASTY Right 01/12/2018   Procedure: RIGHT TOTAL KNEE ARTHROPLASTY;  Surgeon: Leandrew Koyanagi, MD;  Location: [facility];  Service: Orthopedics;  Laterality: Right;  . TUBAL LIGATION      MEDICATIONS: . Cyanocobalamin (B-12) 5000 MCG CAPS  . esomeprazole (NEXIUM) 40 MG capsule  . hydrochlorothiazide (HYDRODIURIL) 25 MG tablet  . losartan (COZAAR) 50 MG tablet  . meloxicam (MOBIC) 7.5 MG tablet  . methocarbamol (ROBAXIN) 500 MG tablet  . nitroGLYCERIN (NITROSTAT) 0.4 MG SL tablet  . oxyCODONE-acetaminophen (PERCOCET/ROXICET) 5-325 MG tablet  . propranolol (INDERAL) 10 MG tablet   No current facility-administered medications for this encounter.    If no changes, I anticipate pt can proceed with surgery as scheduled.   Sarah Cass, PhD, FNP-BC [facility]/Anesthesiology Phone: [phone] 05/07/2021 9:28 AM

## 2021-05-07 NOTE — Anesthesia Preprocedure Evaluation (Addendum)
Anesthesia Evaluation  Patient identified by MRN, date of birth, ID band Patient awake    Reviewed: Allergy & Precautions, NPO status , Patient's Chart, lab work & pertinent test results  History of Anesthesia Complications Negative for: history of anesthetic complications  Airway Mallampati: IV  TM Distance: >3 FB Neck ROM: Full    Dental  (+) Dental Advisory Given, Teeth Intact   Pulmonary neg shortness of breath, sleep apnea , neg COPD, neg recent URI, Current Smoker and Patient abstained from smoking.,  Covid-19 Nucleic Acid Test Results Lab Results      Component                Value               Date                      [place]              NEGATIVE            05/06/2021                [place]              NEGATIVE            01/20/2021                [place]              NEGATIVE            12/16/2020                [place]              NOT DETECTED        12/23/2019                [place]                                  10/16/2019            RESULT:  NEGATIVE    breath sounds clear to auscultation       Cardiovascular hypertension, Pt. on medications (-) angina(-) Past MI and (-) CHF  Rhythm:Regular     Neuro/Psych PSYCHIATRIC DISORDERS Anxiety Depression  Neuromuscular disease    GI/Hepatic Neg liver ROS, GERD  ,  Endo/Other  Morbid obesity  Renal/GU negative Renal ROS     Musculoskeletal  (+) Arthritis ,   Abdominal   Peds  Hematology negative hematology ROS (+) Lab Results      Component                Value               Date                      WBC                      13.2 (H)            05/06/2021                HGB                      15.3 (H)            05/06/2021  HCT                      46.0                05/06/2021                MCV                      91.3                05/06/2021                PLT                      437 (H)              05/06/2021              Anesthesia Other Findings   Reproductive/Obstetrics                            Anesthesia Physical Anesthesia Plan  ASA: III  Anesthesia Plan: General   Post-op Pain Management:    Induction: Intravenous  PONV Risk Score and Plan: 2 and Ondansetron, Dexamethasone and Propofol infusion  Airway Management Planned: Oral ETT  Additional Equipment: None  Intra-op Plan:   Post-operative Plan: Extubation in OR  Informed Consent: I have reviewed the patients History and Physical, chart, labs and discussed the procedure including the risks, benefits and alternatives for the proposed anesthesia with the patient or authorized representative who has indicated his/her understanding and acceptance.     Dental advisory given  Plan Discussed with: CRNA and Surgeon  Anesthesia Plan Comments: (See APP note by Durel Salts, FNP )       Anesthesia Quick Evaluation

## 2021-05-08 ENCOUNTER — Inpatient Hospital Stay (HOSPITAL_COMMUNITY): Payer: Medicare Other

## 2021-05-08 ENCOUNTER — Other Ambulatory Visit: Payer: Self-pay

## 2021-05-08 ENCOUNTER — Inpatient Hospital Stay (HOSPITAL_COMMUNITY)
Admission: RE | Admit: 2021-05-08 | Discharge: 2021-05-09 | DRG: 460 | Disposition: A | Discharge status: Home / Self Care | Payer: Medicare Other | Attending: Neurosurgery | Admitting: Neurosurgery

## 2021-05-08 ENCOUNTER — Inpatient Hospital Stay (HOSPITAL_COMMUNITY): Payer: Medicare Other | Admitting: Emergency Medicine

## 2021-05-08 ENCOUNTER — Encounter (HOSPITAL_COMMUNITY): Payer: Self-pay | Admitting: Neurosurgery

## 2021-05-08 ENCOUNTER — Encounter (HOSPITAL_COMMUNITY)
Admission: RE | Disposition: A | Discharge status: Home / Self Care | Payer: Self-pay | Source: Home / Self Care | Attending: Neurosurgery

## 2021-05-08 DIAGNOSIS — K219 Gastro-esophageal reflux disease without esophagitis: Secondary | ICD-10-CM | POA: Diagnosis present

## 2021-05-08 DIAGNOSIS — F17210 Nicotine dependence, cigarettes, uncomplicated: Secondary | ICD-10-CM | POA: Diagnosis not present

## 2021-05-08 DIAGNOSIS — T84296A Other mechanical complication of internal fixation device of vertebrae, initial encounter: Secondary | ICD-10-CM | POA: Diagnosis not present

## 2021-05-08 DIAGNOSIS — Z96642 Presence of left artificial hip joint: Secondary | ICD-10-CM | POA: Diagnosis present

## 2021-05-08 DIAGNOSIS — M4316 Spondylolisthesis, lumbar region: Secondary | ICD-10-CM | POA: Diagnosis not present

## 2021-05-08 DIAGNOSIS — Z9989 Dependence on other enabling machines and devices: Secondary | ICD-10-CM | POA: Diagnosis not present

## 2021-05-08 DIAGNOSIS — I10 Essential (primary) hypertension: Secondary | ICD-10-CM | POA: Diagnosis present

## 2021-05-08 DIAGNOSIS — Z419 Encounter for procedure for purposes other than remedying health state, unspecified: Secondary | ICD-10-CM

## 2021-05-08 DIAGNOSIS — R7303 Prediabetes: Secondary | ICD-10-CM | POA: Diagnosis present

## 2021-05-08 DIAGNOSIS — Z96651 Presence of right artificial knee joint: Secondary | ICD-10-CM | POA: Diagnosis present

## 2021-05-08 DIAGNOSIS — M4326 Fusion of spine, lumbar region: Secondary | ICD-10-CM | POA: Diagnosis not present

## 2021-05-08 DIAGNOSIS — Z20822 Contact with and (suspected) exposure to covid-19: Secondary | ICD-10-CM | POA: Diagnosis not present

## 2021-05-08 DIAGNOSIS — G4733 Obstructive sleep apnea (adult) (pediatric): Secondary | ICD-10-CM | POA: Diagnosis present

## 2021-05-08 DIAGNOSIS — Z79899 Other long term (current) drug therapy: Secondary | ICD-10-CM | POA: Diagnosis not present

## 2021-05-08 DIAGNOSIS — Z6841 Body Mass Index (BMI) 40.0 and over, adult: Secondary | ICD-10-CM

## 2021-05-08 DIAGNOSIS — E538 Deficiency of other specified B group vitamins: Secondary | ICD-10-CM | POA: Diagnosis present

## 2021-05-08 DIAGNOSIS — M5116 Intervertebral disc disorders with radiculopathy, lumbar region: Secondary | ICD-10-CM | POA: Diagnosis not present

## 2021-05-08 DIAGNOSIS — M5416 Radiculopathy, lumbar region: Principal | ICD-10-CM | POA: Diagnosis present

## 2021-05-08 DIAGNOSIS — E785 Hyperlipidemia, unspecified: Secondary | ICD-10-CM | POA: Diagnosis not present

## 2021-05-08 DIAGNOSIS — Z981 Arthrodesis status: Secondary | ICD-10-CM | POA: Diagnosis not present

## 2021-05-08 LAB — GLUCOSE, CAPILLARY
Glucose-Capillary: 133 mg/dL — ABNORMAL HIGH (ref 70–99)
Glucose-Capillary: 123 mg/dL — ABNORMAL HIGH (ref 70–99)

## 2021-05-08 IMAGING — RF DG LUMBAR SPINE 2-3V
1 series · 2 of 2 positions shown · non-contrast
Comparison: Lumbar radiographs [DATE] (without report).

CLINICAL DATA: Localization images for lumbar fusion revision.

EXAM:
DG C-ARM 1-60 MIN; LUMBAR SPINE - 2-3 VIEW
FLUOROSCOPY TIME:  Fluoroscopy Time:  11 seconds.
Radiation Exposure Index (if provided by the fluoroscopic device):
15.31 mGy.
Number of Acquired Spot Images: 2

[Series 1: run · 2 of 2 slices shown]
[im 1/2]
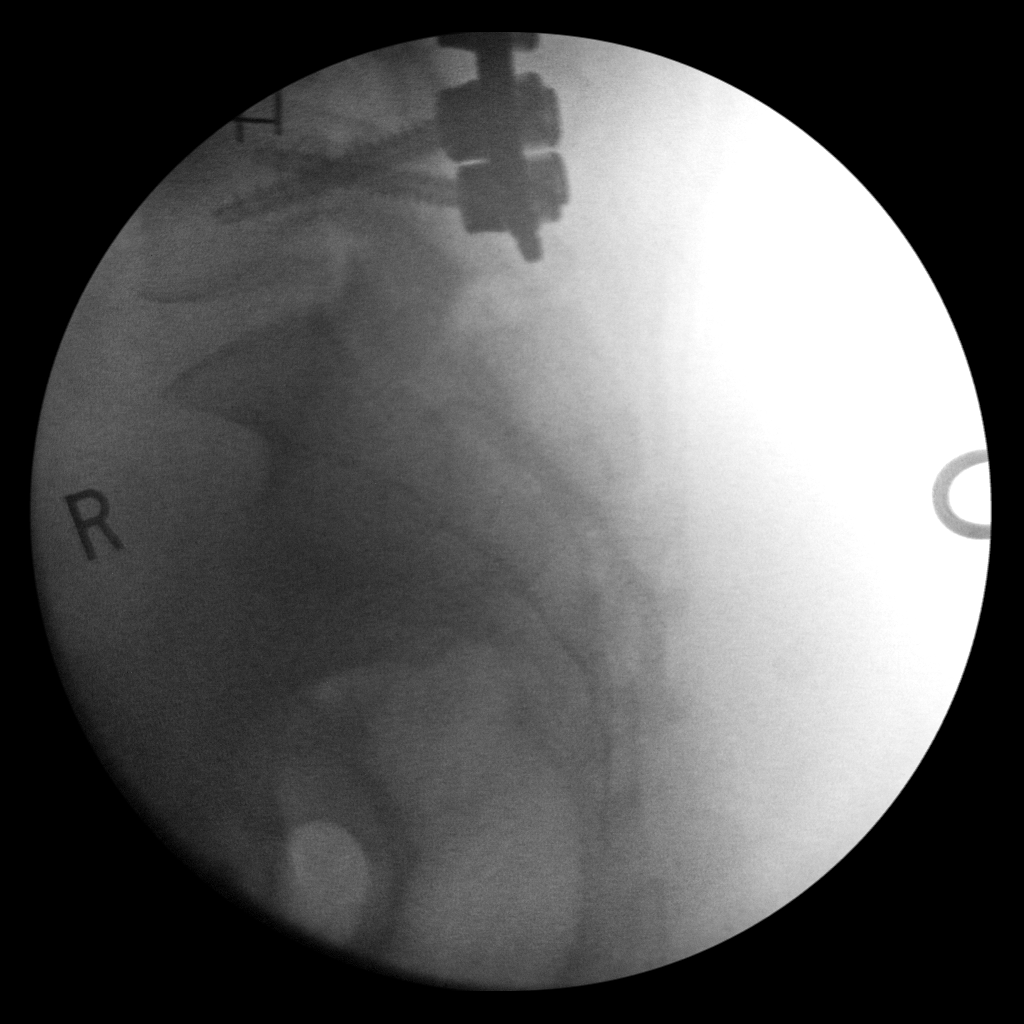
[im 2/2]
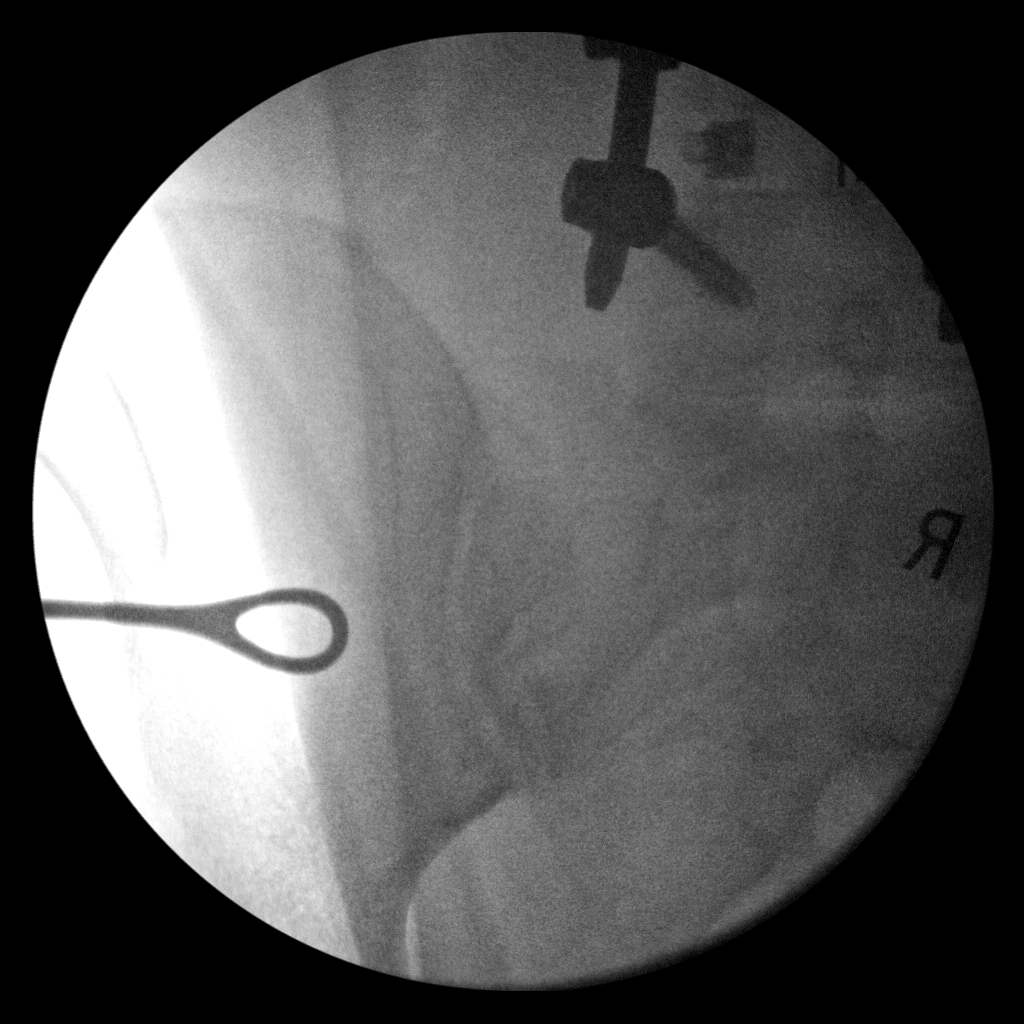

[2 of 2 positions shown; findings below may reference images not displayed]

FINDINGS: Two C-arm fluoroscopic images were obtained intraoperatively and
submitted for post operative interpretation. The first image (a
lateral) demonstrates a surgical instrument projecting posteriorly
at the level of the lower sacrum. The second image (a frontal)
demonstrates the surgical instrument projecting at the left iliac
bone.Partially imaged lower lumbar fusion hardware. Please see the
performing provider's procedural report for further detail.
IMPRESSION: Intraoperative fluoroscopy, as detailed above.

## 2021-05-08 IMAGING — RF DG C-ARM 1-60 MIN
1 series · 2 of 2 positions shown · non-contrast
Comparison: Lumbar radiographs [DATE] (without report).

CLINICAL DATA: Localization images for lumbar fusion revision.

EXAM:
DG C-ARM 1-60 MIN; LUMBAR SPINE - 2-3 VIEW
FLUOROSCOPY TIME:  Fluoroscopy Time:  11 seconds.
Radiation Exposure Index (if provided by the fluoroscopic device):
15.31 mGy.
Number of Acquired Spot Images: 2

[Series 1: run · 2 of 2 slices shown]
[im 1/2]
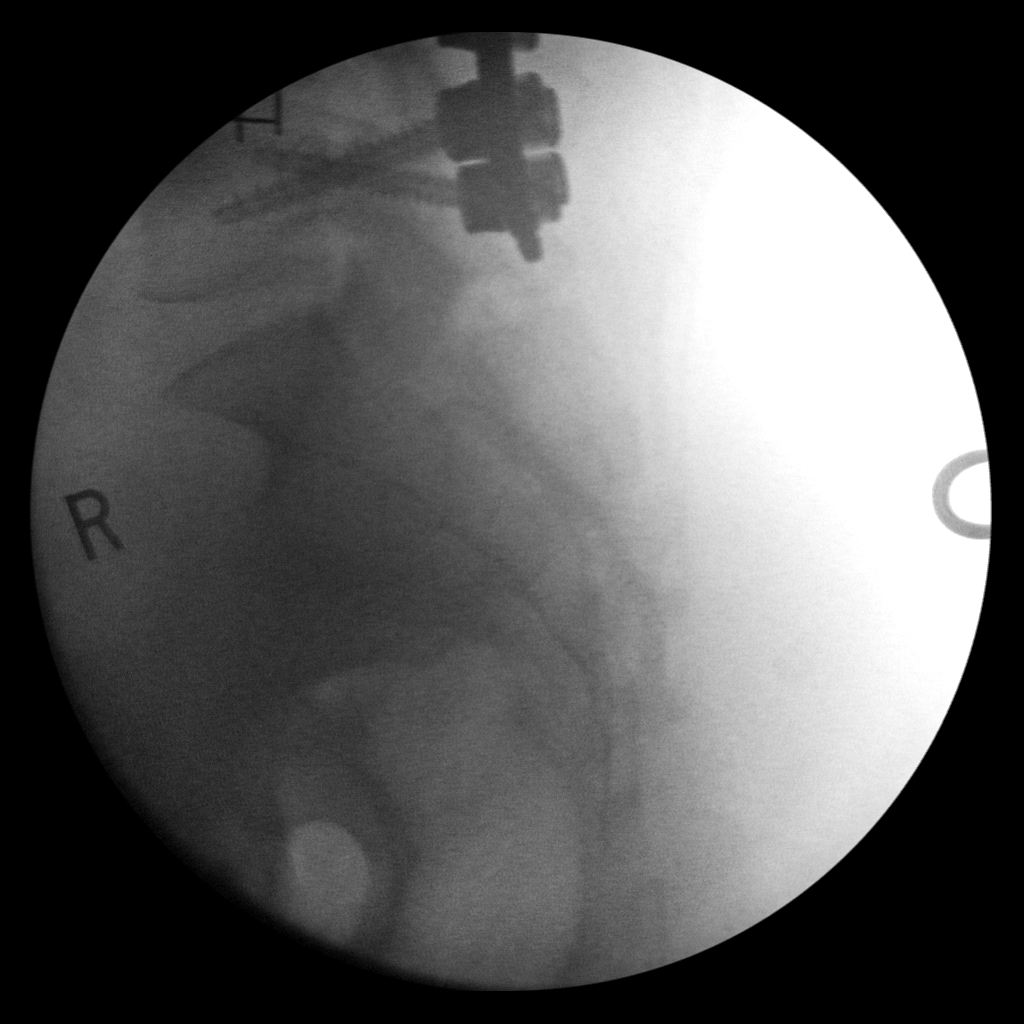
[im 2/2]
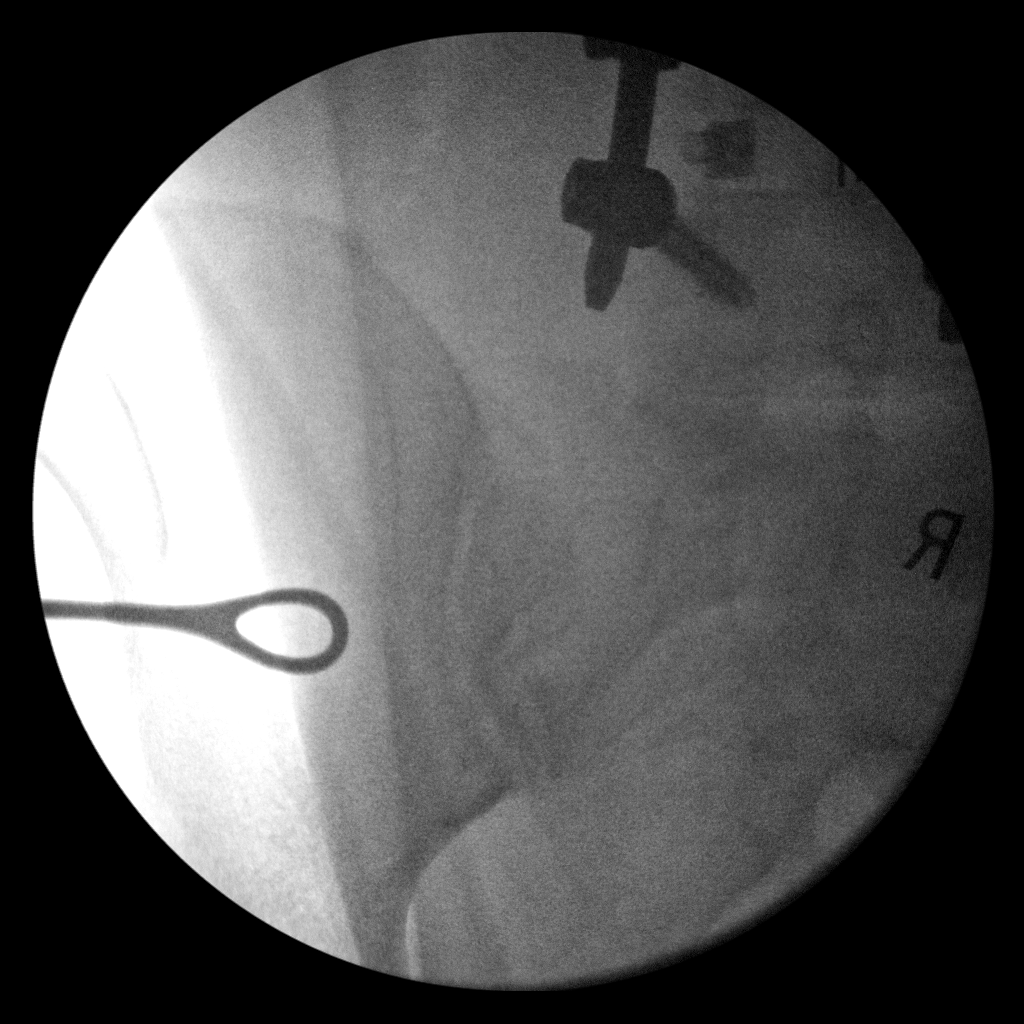

[2 of 2 positions shown; findings below may reference images not displayed]

FINDINGS: Two C-arm fluoroscopic images were obtained intraoperatively and
submitted for post operative interpretation. The first image (a
lateral) demonstrates a surgical instrument projecting posteriorly
at the level of the lower sacrum. The second image (a frontal)
demonstrates the surgical instrument projecting at the left iliac
bone.Partially imaged lower lumbar fusion hardware. Please see the
performing provider's procedural report for further detail.
IMPRESSION: Intraoperative fluoroscopy, as detailed above.

## 2021-05-08 SURGERY — POSTERIOR LUMBAR FUSION 1 LEVEL
Anesthesia: General | Laterality: Right

## 2021-05-08 MED ORDER — LIDOCAINE-EPINEPHRINE 1 %-1:100000 IJ SOLN
INTRAMUSCULAR | Status: DC | PRN
  Administered 2021-05-08: 7 mL

## 2021-05-08 MED ORDER — PROPOFOL 10 MG/ML IV BOLUS
INTRAVENOUS | Status: DC | PRN
  Administered 2021-05-08: 20 ug/kg/min via INTRAVENOUS
  Administered 2021-05-08: 130 mg via INTRAVENOUS

## 2021-05-08 MED ORDER — ONDANSETRON HCL 4 MG/2ML IJ SOLN: 4.0000 mg | Freq: Four times a day (QID) | INTRAMUSCULAR | Status: DC | PRN

## 2021-05-08 MED ORDER — NITROGLYCERIN 0.4 MG SL SUBL: 0.4000 mg | SUBLINGUAL_TABLET | SUBLINGUAL | Status: DC | PRN

## 2021-05-08 MED ORDER — ONDANSETRON HCL 4 MG/2ML IJ SOLN
INTRAMUSCULAR | Status: AC
  Filled 2021-05-08: qty 2

## 2021-05-08 MED ORDER — MENTHOL 3 MG MT LOZG: 1.0000 | LOZENGE | OROMUCOSAL | Status: DC | PRN

## 2021-05-08 MED ORDER — ORAL CARE MOUTH RINSE: 15.0000 mL | Freq: Once | OROMUCOSAL | Status: AC

## 2021-05-08 MED ORDER — SUCCINYLCHOLINE CHLORIDE 200 MG/10ML IV SOSY
PREFILLED_SYRINGE | INTRAVENOUS | Status: AC
  Filled 2021-05-08: qty 10

## 2021-05-08 MED ORDER — ACETAMINOPHEN 325 MG PO TABS: 650.0000 mg | ORAL_TABLET | ORAL | Status: DC | PRN

## 2021-05-08 MED ORDER — SODIUM CHLORIDE 0.9 % IV SOLN: 250.0000 mL | INTRAVENOUS | Status: DC

## 2021-05-08 MED ORDER — BACITRACIN ZINC 500 UNIT/GM EX OINT
TOPICAL_OINTMENT | CUTANEOUS | Status: AC
  Filled 2021-05-08: qty 28.35

## 2021-05-08 MED ORDER — DOCUSATE SODIUM 100 MG PO CAPS
100.0000 mg | ORAL_CAPSULE | Freq: Two times a day (BID) | ORAL | Status: DC
  Administered 2021-05-08 – 2021-05-09 (×3): 100 mg via ORAL
  Filled 2021-05-08 (×3): qty 1

## 2021-05-08 MED ORDER — ENOXAPARIN SODIUM 40 MG/0.4ML IJ SOSY
40.0000 mg | PREFILLED_SYRINGE | INTRAMUSCULAR | Status: DC
  Administered 2021-05-09: 40 mg via SUBCUTANEOUS
  Filled 2021-05-08: qty 0.4

## 2021-05-08 MED ORDER — SUGAMMADEX SODIUM 500 MG/5ML IV SOLN
INTRAVENOUS | Status: AC
  Filled 2021-05-08: qty 5

## 2021-05-08 MED ORDER — CEFAZOLIN IN SODIUM CHLORIDE 3-0.9 GM/100ML-% IV SOLN
3.0000 g | INTRAVENOUS | Status: AC
  Administered 2021-05-08: 3 g via INTRAVENOUS
  Filled 2021-05-08: qty 100

## 2021-05-08 MED ORDER — ACETAMINOPHEN 160 MG/5ML PO SOLN: 1000.0000 mg | Freq: Once | ORAL | Status: DC | PRN

## 2021-05-08 MED ORDER — BUPIVACAINE LIPOSOME 1.3 % IJ SUSP
INTRAMUSCULAR | Status: AC
  Filled 2021-05-08: qty 20

## 2021-05-08 MED ORDER — FENTANYL CITRATE (PF) 100 MCG/2ML IJ SOLN
INTRAMUSCULAR | Status: AC
  Filled 2021-05-08: qty 2

## 2021-05-08 MED ORDER — ONDANSETRON HCL 4 MG PO TABS: 4.0000 mg | ORAL_TABLET | Freq: Four times a day (QID) | ORAL | Status: DC | PRN

## 2021-05-08 MED ORDER — HYDROCHLOROTHIAZIDE 25 MG PO TABS
25.0000 mg | ORAL_TABLET | Freq: Every day | ORAL | Status: DC
  Administered 2021-05-08 – 2021-05-09 (×2): 25 mg via ORAL
  Filled 2021-05-08 (×2): qty 1

## 2021-05-08 MED ORDER — MIDAZOLAM HCL 2 MG/2ML IJ SOLN
INTRAMUSCULAR | Status: DC | PRN
  Administered 2021-05-08: 2 mg via INTRAVENOUS

## 2021-05-08 MED ORDER — FENTANYL CITRATE (PF) 100 MCG/2ML IJ SOLN
25.0000 ug | INTRAMUSCULAR | Status: DC | PRN
  Administered 2021-05-08 (×2): 50 ug via INTRAVENOUS

## 2021-05-08 MED ORDER — LOSARTAN POTASSIUM 50 MG PO TABS
50.0000 mg | ORAL_TABLET | Freq: Every day | ORAL | Status: DC
  Administered 2021-05-08 – 2021-05-09 (×2): 50 mg via ORAL
  Filled 2021-05-08 (×2): qty 1

## 2021-05-08 MED ORDER — BUPIVACAINE LIPOSOME 1.3 % IJ SUSP
INTRAMUSCULAR | Status: DC | PRN
  Administered 2021-05-08: 20 mL

## 2021-05-08 MED ORDER — SODIUM CHLORIDE 0.9% FLUSH
3.0000 mL | Freq: Two times a day (BID) | INTRAVENOUS | Status: DC
  Administered 2021-05-08: 3 mL via INTRAVENOUS

## 2021-05-08 MED ORDER — ACETAMINOPHEN 10 MG/ML IV SOLN: 1000.0000 mg | Freq: Once | INTRAVENOUS | Status: DC | PRN

## 2021-05-08 MED ORDER — THROMBIN 5000 UNITS EX SOLR
CUTANEOUS | Status: AC
  Filled 2021-05-08: qty 5000

## 2021-05-08 MED ORDER — OXYCODONE-ACETAMINOPHEN 5-325 MG PO TABS
1.0000 | ORAL_TABLET | Freq: Four times a day (QID) | ORAL | Status: DC | PRN
  Administered 2021-05-08 – 2021-05-09 (×4): 2 via ORAL
  Filled 2021-05-08 (×4): qty 2

## 2021-05-08 MED ORDER — LACTATED RINGERS IV SOLN: INTRAVENOUS | Status: DC

## 2021-05-08 MED ORDER — BUPIVACAINE HCL (PF) 0.5 % IJ SOLN
INTRAMUSCULAR | Status: AC
  Filled 2021-05-08: qty 30

## 2021-05-08 MED ORDER — VITAMIN B-12 1000 MCG PO TABS
5000.0000 ug | ORAL_TABLET | ORAL | Status: DC
  Administered 2021-05-09: 5000 ug via ORAL
  Filled 2021-05-08: qty 5

## 2021-05-08 MED ORDER — BUPIVACAINE HCL (PF) 0.5 % IJ SOLN
INTRAMUSCULAR | Status: DC | PRN
  Administered 2021-05-08: 20 mL

## 2021-05-08 MED ORDER — ROCURONIUM BROMIDE 10 MG/ML (PF) SYRINGE
PREFILLED_SYRINGE | INTRAVENOUS | Status: AC
  Filled 2021-05-08: qty 10

## 2021-05-08 MED ORDER — POLYETHYLENE GLYCOL 3350 17 G PO PACK: 17.0000 g | PACK | Freq: Every day | ORAL | Status: DC | PRN

## 2021-05-08 MED ORDER — MIDAZOLAM HCL 2 MG/2ML IJ SOLN
INTRAMUSCULAR | Status: AC
  Filled 2021-05-08: qty 2

## 2021-05-08 MED ORDER — CHLORHEXIDINE GLUCONATE 0.12 % MT SOLN
15.0000 mL | Freq: Once | OROMUCOSAL | Status: AC
  Administered 2021-05-08: 15 mL via OROMUCOSAL
  Filled 2021-05-08: qty 15

## 2021-05-08 MED ORDER — CHLORHEXIDINE GLUCONATE CLOTH 2 % EX PADS: 6.0000 | MEDICATED_PAD | Freq: Once | CUTANEOUS | Status: DC

## 2021-05-08 MED ORDER — METHOCARBAMOL 500 MG PO TABS
1000.0000 mg | ORAL_TABLET | Freq: Four times a day (QID) | ORAL | Status: DC | PRN
  Administered 2021-05-08 – 2021-05-09 (×4): 1000 mg via ORAL
  Filled 2021-05-08 (×3): qty 2

## 2021-05-08 MED ORDER — FENTANYL CITRATE (PF) 250 MCG/5ML IJ SOLN
INTRAMUSCULAR | Status: AC
  Filled 2021-05-08: qty 5

## 2021-05-08 MED ORDER — ROCURONIUM BROMIDE 10 MG/ML (PF) SYRINGE
PREFILLED_SYRINGE | INTRAVENOUS | Status: DC | PRN
  Administered 2021-05-08: 20 mg via INTRAVENOUS
  Administered 2021-05-08: 100 mg via INTRAVENOUS

## 2021-05-08 MED ORDER — 0.9 % SODIUM CHLORIDE (POUR BTL) OPTIME
TOPICAL | Status: DC | PRN
  Administered 2021-05-08: 1000 mL

## 2021-05-08 MED ORDER — MORPHINE SULFATE (PF) 2 MG/ML IV SOLN
2.0000 mg | INTRAVENOUS | Status: DC | PRN
Start: 2021-05-08 — End: 2021-05-09
  Administered 2021-05-08: 2 mg via INTRAVENOUS
  Filled 2021-05-08: qty 1

## 2021-05-08 MED ORDER — PHENOL 1.4 % MT LIQD: 1.0000 | OROMUCOSAL | Status: DC | PRN

## 2021-05-08 MED ORDER — DEXAMETHASONE SODIUM PHOSPHATE 10 MG/ML IJ SOLN
INTRAMUSCULAR | Status: AC
  Filled 2021-05-08: qty 1

## 2021-05-08 MED ORDER — CEFAZOLIN SODIUM-DEXTROSE 2-4 GM/100ML-% IV SOLN
2.0000 g | Freq: Three times a day (TID) | INTRAVENOUS | Status: DC
  Administered 2021-05-08 – 2021-05-09 (×2): 2 g via INTRAVENOUS
  Filled 2021-05-08 (×2): qty 100

## 2021-05-08 MED ORDER — SODIUM CHLORIDE (PF) 0.9 % IJ SOLN
INTRAMUSCULAR | Status: DC | PRN
  Administered 2021-05-08: 10 mL via INTRAVENOUS

## 2021-05-08 MED ORDER — FENTANYL CITRATE (PF) 250 MCG/5ML IJ SOLN
INTRAMUSCULAR | Status: DC | PRN
  Administered 2021-05-08 (×2): 100 ug via INTRAVENOUS

## 2021-05-08 MED ORDER — THROMBIN 5000 UNITS EX SOLR
OROMUCOSAL | Status: DC | PRN
  Administered 2021-05-08: 5 mL via TOPICAL

## 2021-05-08 MED ORDER — PHENYLEPHRINE HCL-NACL 10-0.9 MG/250ML-% IV SOLN
INTRAVENOUS | Status: DC | PRN
  Administered 2021-05-08: 20 ug/min via INTRAVENOUS

## 2021-05-08 MED ORDER — PANTOPRAZOLE SODIUM 40 MG PO TBEC
40.0000 mg | DELAYED_RELEASE_TABLET | Freq: Every day | ORAL | Status: DC
  Administered 2021-05-09: 40 mg via ORAL
  Filled 2021-05-08: qty 1

## 2021-05-08 MED ORDER — OXYCODONE HCL 5 MG/5ML PO SOLN: 5.0000 mg | Freq: Once | ORAL | Status: DC | PRN

## 2021-05-08 MED ORDER — ACETAMINOPHEN 500 MG PO TABS: 1000.0000 mg | ORAL_TABLET | Freq: Once | ORAL | Status: DC | PRN

## 2021-05-08 MED ORDER — PROPRANOLOL HCL 10 MG PO TABS
10.0000 mg | ORAL_TABLET | Freq: Every day | ORAL | Status: DC
  Administered 2021-05-09: 10 mg via ORAL
  Filled 2021-05-08: qty 1

## 2021-05-08 MED ORDER — ACETAMINOPHEN 650 MG RE SUPP: 650.0000 mg | RECTAL | Status: DC | PRN

## 2021-05-08 MED ORDER — LIDOCAINE 2% (20 MG/ML) 5 ML SYRINGE
INTRAMUSCULAR | Status: AC
  Filled 2021-05-08: qty 5

## 2021-05-08 MED ORDER — DEXMEDETOMIDINE (PRECEDEX) IN NS 20 MCG/5ML (4 MCG/ML) IV SYRINGE
PREFILLED_SYRINGE | INTRAVENOUS | Status: DC | PRN
  Administered 2021-05-08 (×2): 4 ug via INTRAVENOUS
  Administered 2021-05-08: 8 ug via INTRAVENOUS
  Administered 2021-05-08: 4 ug via INTRAVENOUS

## 2021-05-08 MED ORDER — POTASSIUM CHLORIDE IN NACL 20-0.9 MEQ/L-% IV SOLN: INTRAVENOUS | Status: DC

## 2021-05-08 MED ORDER — PHENYLEPHRINE 40 MCG/ML (10ML) SYRINGE FOR IV PUSH (FOR BLOOD PRESSURE SUPPORT)
PREFILLED_SYRINGE | INTRAVENOUS | Status: DC | PRN
  Administered 2021-05-08: 80 ug via INTRAVENOUS

## 2021-05-08 MED ORDER — DEXAMETHASONE SODIUM PHOSPHATE 10 MG/ML IJ SOLN
INTRAMUSCULAR | Status: DC | PRN
  Administered 2021-05-08: 10 mg via INTRAVENOUS

## 2021-05-08 MED ORDER — OXYCODONE HCL 5 MG PO TABS: 5.0000 mg | ORAL_TABLET | Freq: Once | ORAL | Status: DC | PRN

## 2021-05-08 MED ORDER — LIDOCAINE-EPINEPHRINE 1 %-1:100000 IJ SOLN
INTRAMUSCULAR | Status: AC
  Filled 2021-05-08: qty 1

## 2021-05-08 MED ORDER — SUGAMMADEX SODIUM 200 MG/2ML IV SOLN
INTRAVENOUS | Status: DC | PRN
  Administered 2021-05-08: 300 mg via INTRAVENOUS

## 2021-05-08 MED ORDER — LIDOCAINE 2% (20 MG/ML) 5 ML SYRINGE
INTRAMUSCULAR | Status: DC | PRN
  Administered 2021-05-08: 100 mg via INTRAVENOUS

## 2021-05-08 MED ORDER — PROPOFOL 10 MG/ML IV BOLUS
INTRAVENOUS | Status: AC
  Filled 2021-05-08: qty 40

## 2021-05-08 MED ORDER — DEXMEDETOMIDINE (PRECEDEX) IN NS 20 MCG/5ML (4 MCG/ML) IV SYRINGE
PREFILLED_SYRINGE | INTRAVENOUS | Status: AC
  Filled 2021-05-08: qty 5

## 2021-05-08 MED ORDER — ONDANSETRON HCL 4 MG/2ML IJ SOLN
INTRAMUSCULAR | Status: DC | PRN
  Administered 2021-05-08 (×2): 4 mg via INTRAVENOUS

## 2021-05-08 MED ORDER — SODIUM CHLORIDE 0.9% FLUSH: 3.0000 mL | INTRAVENOUS | Status: DC | PRN

## 2021-05-08 MED ORDER — FLEET ENEMA 7-19 GM/118ML RE ENEM: 1.0000 | ENEMA | Freq: Once | RECTAL | Status: DC | PRN

## 2021-05-08 SURGICAL SUPPLY — 67 items
BAND RUBBER #18 3X1/16 STRL (MISCELLANEOUS) IMPLANT
BASKET BONE COLLECTION (BASKET) ×3 IMPLANT
BLADE CLIPPER SURG (BLADE) IMPLANT
BUR MATCHSTICK NEURO 3.0 LAGG (BURR) ×3 IMPLANT
BUR PRECISION FLUTE 5.0 (BURR) ×3 IMPLANT
CANISTER SUCT 3000ML PPV (MISCELLANEOUS) ×3 IMPLANT
CNTNR URN SCR LID CUP LEK RST (MISCELLANEOUS) ×1 IMPLANT
CONT SPEC 4OZ STRL OR WHT (MISCELLANEOUS) ×2
COVER BACK TABLE 60X90IN (DRAPES) ×3 IMPLANT
COVER WAND RF STERILE (DRAPES) IMPLANT
DECANTER SPIKE VIAL GLASS SM (MISCELLANEOUS) ×3 IMPLANT
DERMABOND ADVANCED (GAUZE/BANDAGES/DRESSINGS) ×2
DERMABOND ADVANCED .7 DNX12 (GAUZE/BANDAGES/DRESSINGS) ×1 IMPLANT
DRAIN JACKSON PRATT 10MM FLAT (MISCELLANEOUS) IMPLANT
DRAPE 3/4 80X56 (DRAPES) ×3 IMPLANT
DRAPE C-ARM 42X72 X-RAY (DRAPES) ×3 IMPLANT
DRAPE C-ARMOR (DRAPES) ×3 IMPLANT
DRAPE LAPAROTOMY 100X72X124 (DRAPES) ×3 IMPLANT
DRAPE MICROSCOPE LEICA (MISCELLANEOUS) IMPLANT
DRSG OPSITE POSTOP 4X6 (GAUZE/BANDAGES/DRESSINGS) ×3 IMPLANT
DURAPREP 26ML APPLICATOR (WOUND CARE) ×3 IMPLANT
ELECT BLADE INSULATED 6.5IN (ELECTROSURGICAL) ×3
ELECT REM PT RETURN 9FT ADLT (ELECTROSURGICAL) ×3
ELECTRODE BLDE INSULATED 6.5IN (ELECTROSURGICAL) ×1 IMPLANT
ELECTRODE REM PT RTRN 9FT ADLT (ELECTROSURGICAL) ×1 IMPLANT
EVACUATOR SILICONE 100CC (DRAIN) IMPLANT
GAUZE 4X4 16PLY RFD (DISPOSABLE) IMPLANT
GAUZE SPONGE 4X4 12PLY STRL (GAUZE/BANDAGES/DRESSINGS) IMPLANT
GLOVE BIOGEL PI IND STRL 7.5 (GLOVE) ×1 IMPLANT
GLOVE BIOGEL PI INDICATOR 7.5 (GLOVE) ×2
GLOVE ECLIPSE 7.5 STRL STRAW (GLOVE) ×6 IMPLANT
GLOVE EXAM NITRILE XL STR (GLOVE) IMPLANT
GOWN STRL REUS W/ TWL LRG LVL3 (GOWN DISPOSABLE) ×1 IMPLANT
GOWN STRL REUS W/ TWL XL LVL3 (GOWN DISPOSABLE) ×1 IMPLANT
GOWN STRL REUS W/TWL 2XL LVL3 (GOWN DISPOSABLE) IMPLANT
GOWN STRL REUS W/TWL LRG LVL3 (GOWN DISPOSABLE) ×2
GOWN STRL REUS W/TWL XL LVL3 (GOWN DISPOSABLE) ×2
HEMOSTAT POWDER KIT SURGIFOAM (HEMOSTASIS) ×3 IMPLANT
KIT BASIN OR (CUSTOM PROCEDURE TRAY) ×3 IMPLANT
KIT TURNOVER KIT B (KITS) ×3 IMPLANT
MILL MEDIUM DISP (BLADE) ×3 IMPLANT
NEEDLE HYPO 18GX1.5 BLUNT FILL (NEEDLE) IMPLANT
NEEDLE HYPO 21X1.5 SAFETY (NEEDLE) ×3 IMPLANT
NEEDLE HYPO 22GX1.5 SAFETY (NEEDLE) ×3 IMPLANT
NEEDLE SPNL 18GX3.5 QUINCKE PK (NEEDLE) IMPLANT
NS IRRIG 1000ML POUR BTL (IV SOLUTION) ×3 IMPLANT
PACK LAMINECTOMY NEURO (CUSTOM PROCEDURE TRAY) ×3 IMPLANT
PAD ARMBOARD 7.5X6 YLW CONV (MISCELLANEOUS) ×9 IMPLANT
PUTTY GRAFTON DBF 6CC W/DELIVE (Putty) ×3 IMPLANT
ROD 45MM SOLERA PREBENT (Rod) ×3 IMPLANT
SCREW SET SOLERA (Screw) ×4 IMPLANT
SCREW SET SOLERA TI5.5 (Screw) ×2 IMPLANT
SCREW SOLERA 45X6.5XMA NS SPNE (Screw) ×1 IMPLANT
SCREW SOLERA 6.5X45MM (Screw) ×2 IMPLANT
SPONGE LAP 4X18 RFD (DISPOSABLE) IMPLANT
SPONGE SURGIFOAM ABS GEL 100 (HEMOSTASIS) IMPLANT
STAPLER VISISTAT 35W (STAPLE) ×3 IMPLANT
SUT MNCRL AB 3-0 PS2 18 (SUTURE) ×3 IMPLANT
SUT MNCRL AB 4-0 PS2 18 (SUTURE) ×3 IMPLANT
SUT VIC AB 0 CT1 18XCR BRD8 (SUTURE) ×1 IMPLANT
SUT VIC AB 0 CT1 8-18 (SUTURE) ×2
SUT VIC AB 2-0 CP2 18 (SUTURE) ×6 IMPLANT
SYR 30ML LL (SYRINGE) ×3 IMPLANT
TOWEL GREEN STERILE (TOWEL DISPOSABLE) ×3 IMPLANT
TOWEL GREEN STERILE FF (TOWEL DISPOSABLE) ×3 IMPLANT
TRAY FOLEY MTR SLVR 16FR STAT (SET/KITS/TRAYS/PACK) ×3 IMPLANT
WATER STERILE IRR 1000ML POUR (IV SOLUTION) ×3 IMPLANT

## 2021-05-08 NOTE — Transfer of Care (Signed)
Immediate Anesthesia Transfer of Care Note  Patient: Sarah Randall Below  Procedure(s) Performed: Revision of instrumentation and posterolateral fusion right Lumbar Four- Lumbar Five (Right )  Patient Location: PACU  Anesthesia Type:General  Level of Consciousness: drowsy  Airway & Oxygen Therapy: Patient Spontanous Breathing and Patient connected to face mask oxygen  Post-op Assessment: Report given to RN and Post -op Vital signs reviewed and stable  Post vital signs: Reviewed and stable  Last Vitals:  Vitals Value Taken Time  BP 115/53 05/08/21 1045  Temp    Pulse 92 05/08/21 1046  Resp 19 05/08/21 1046  SpO2 100 % 05/08/21 1046  Vitals shown include unvalidated device data.  Last Pain:  Vitals:   05/08/21 0641  PainSc: 7       Patients Stated Pain Goal: 2 (93/96/88 6484)  Complications: No complications documented.

## 2021-05-08 NOTE — H&P (Signed)
CC: right leg pain  HPI:     Patient is a 55 y.o. female with obesity, OSA underwent L4-5 OLIF in February.  2 months later developed R leg radiculopathy, thought to be emanating from irritation of the L5 nerve root from her instrumentation.  As the symptoms persisted, she wished to undergo instrumentation revision.    Patient Active Problem List   Diagnosis Date Noted  . Multiple joint pain 02/10/2021  . Positive ANA (antinuclear antibody) 12/13/2020  . Trigger finger of left thumb 12/10/2020  . Lumbar radiculopathy 11/07/2020  . Chronic right-sided low back pain with right-sided sciatica 10/10/2020  . Sleep disturbance 10/10/2020  . Myalgia 10/10/2020  . Super obese 10/10/2020  . Abnormality of gait 10/10/2020  . Statin intolerance 09/17/2020  . Recurrent boils 09/17/2020  . Chronic bilateral low back pain 05/16/2020  . Postoperative state 12/27/2019  . Iron deficiency 08/03/2019  . B12 deficiency 06/30/2019  . Chronic right shoulder pain 07/05/2018  . Trochanteric bursitis of left hip 07/05/2018  . Morbid obesity ([place]) 07/05/2018  . Body mass index 45.0-49.9, adult ([place]) 07/05/2018  . Marijuana user 05/26/2018  . Status post total right knee replacement 01/12/2018  . Status post total hip replacement, left 09/22/2017  . Hyperlipidemia 08/10/2017  . Mild depression ([place]) 06/25/2017  . Essential hypertension 06/25/2017  . Tobacco abuse 06/25/2017  . Prediabetes 06/25/2017  . OSA on CPAP 06/25/2017  . Bronchitis 09/24/2016  . Leukocytosis 09/24/2016  . Radiculopathy, lumbar region 07/16/2016  . Degenerative lumbar disc 07/03/2016  . Synovitis of hip 04/20/2016  . Screening for breast cancer 12/28/2014  . Pain in joint involving lower leg 05/13/2013   Past Medical History:  Diagnosis Date  . Anemia   . Anxiety   . Arthritis   . B12 deficiency 06/30/2019  . Depression   . GERD (gastroesophageal reflux disease)    occasionally takes OTC  . History of leukocytosis    . Hyperlipidemia   . Hypertension   . Low back pain   . Obesity   . Pain in right buttock    Goes down to Right leg to lateral aspect of calf.  . Pre-diabetes   . Sleep apnea    tested 2014 - unable to tolerate machine  . Spondylolisthesis at L4-L5 level     Past Surgical History:  Procedure Laterality Date  . ABDOMINAL EXPOSURE N/A 01/22/2021   Procedure: LATERAL EXPOSURE FOR OBLIQUE LATERAL INTERBODY FUSION;  Surgeon: Marty Heck, MD;  Location: [facility];  Service: Vascular;  Laterality: N/A;  . ABDOMINAL HYSTERECTOMY    . ANTERIOR LUMBAR FUSION N/A 01/22/2021   Procedure: Oblique Lateral Interbody Fusion Lumbar Four-Lumbar Five;  Surgeon: Vallarie Mare, MD;  Location: [facility];  Service: Neurosurgery;  Laterality: N/A;  . LEFT HEART CATH AND CORONARY ANGIOGRAPHY N/A 10/25/2018   Procedure: LEFT HEART CATH AND CORONARY ANGIOGRAPHY;  Surgeon: Martinique, Peter M, MD;  Location: [facility];  Service: Cardiovascular;  Laterality: N/A;  . LUMBAR PERCUTANEOUS PEDICLE SCREW 1 LEVEL N/A 01/22/2021   Procedure: LUMBAR PERCUTANEOUS PEDICLE SCREW LUBAR FOUR- FIVE;  Surgeon: Vallarie Mare, MD;  Location: [facility];  Service: Neurosurgery;  Laterality: N/A;  . ROBOTIC ASSISTED LAPAROSCOPIC HYSTERECTOMY AND SALPINGECTOMY Bilateral 12/27/2019   Procedure: XI ROBOTIC ASSISTED TOTAL LAPAROSCOPIC HYSTERECTOMY AND SALPINGECTOMY;  Surgeon: Princess Bruins, MD;  Location: [facility];  Service: Gynecology;  Laterality: Bilateral;  . TOTAL HIP ARTHROPLASTY Left 09/22/2017   Procedure: LEFT TOTAL HIP ARTHROPLASTY  ANTERIOR APPROACH;  Surgeon: Leandrew Koyanagi, MD;  Location: [facility];  Service: Orthopedics;  Laterality: Left;  . TOTAL KNEE ARTHROPLASTY Right 01/12/2018   Procedure: RIGHT TOTAL KNEE ARTHROPLASTY;  Surgeon: Leandrew Koyanagi, MD;  Location: [facility];  Service: Orthopedics;  Laterality: Right;  . TUBAL LIGATION      Medications Prior to Admission  Medication Sig Dispense Refill  Last Dose  . Cyanocobalamin (B-12) 5000 MCG CAPS Take 5,000 mcg by mouth every other day.   Past Week at Unknown time  . esomeprazole (NEXIUM) 40 MG capsule TAKE 1 CAPSULE BY MOUTH EVERY DAY (Patient taking differently: Take 40 mg by mouth daily.) 90 capsule 1 05/08/2021 at 0430  . hydrochlorothiazide (HYDRODIURIL) 25 MG tablet TAKE 1 TABLET BY MOUTH EVERY DAY (Patient taking differently: Take 25 mg by mouth daily.) 90 tablet 1 05/07/2021 at Unknown time  . losartan (COZAAR) 50 MG tablet TAKE 1 TABLET BY MOUTH EVERY DAY (Patient taking differently: Take 50 mg by mouth daily.) 30 tablet 0 05/07/2021 at Unknown time  . nitroGLYCERIN (NITROSTAT) 0.4 MG SL tablet Place 1 tablet (0.4 mg total) under the tongue every 5 (five) minutes as needed. 25 tablet 11   . propranolol (INDERAL) 10 MG tablet TAKE 1 TABLET BY MOUTH TWICE A DAY (Patient taking differently: Take 10 mg by mouth daily.) 180 tablet 1 05/08/2021 at 0430  . meloxicam (MOBIC) 7.5 MG tablet Take 1 tablet (7.5 mg total) by mouth 2 (two) times daily as needed for pain. (Patient not taking: Reported on 04/28/2021) 30 tablet 2 Not Taking at Unknown time  . methocarbamol (ROBAXIN) 500 MG tablet Take 2 tablets (1,000 mg total) by mouth every 6 (six) hours as needed for muscle spasms. (Patient not taking: Reported on 04/28/2021) 240 tablet 1 Not Taking at Unknown time  . oxyCODONE-acetaminophen (PERCOCET/ROXICET) 5-325 MG tablet Take 1-2 tablets by mouth every 6 (six) hours as needed for moderate pain or severe pain. (Patient not taking: No sig reported) 45 tablet 0 Not Taking at Unknown time   Allergies  Allergen Reactions  . Pregabalin Swelling  . Cymbalta [Duloxetine Hcl] Nausea Only  . Gabapentin Swelling  . Lipitor [Atorvastatin] Other (See Comments)    Stabbing pains in legs  . Pravachol [Pravastatin]     Sharp pains in LT leg  . Zoloft [Sertraline Hcl] Other (See Comments)    tremors    Social History   Tobacco Use  . Smoking status: Current  Every Day Smoker    Packs/day: 0.25    Years: 30.00    Pack years: 7.50    Types: Cigarettes  . Smokeless tobacco: Never Used  Substance Use Topics  . Alcohol use: Yes    Alcohol/week: 4.0 - 6.0 standard drinks    Types: 4 - 6 Shots of liquor per week    Comment: occ    Family History  Problem Relation Age of Onset  . Hypertension Mother   . Diabetes Mother   . Breast cancer Mother 23  . Hypertension Father   . Lung cancer Maternal Aunt   . Colon polyps Brother   . Colon cancer Neg Hx   . Esophageal cancer Neg Hx   . Rectal cancer Neg Hx   . Stomach cancer Neg Hx      Review of Systems Pertinent items noted in HPI and remainder of comprehensive ROS otherwise negative.   She does complain of some R hip pain.  Objective:   Patient Vitals for the  past 8 hrs:  BP Temp Pulse Resp SpO2 Height Weight  05/08/21 0547 128/84 98.5 F (36.9 C) 92 20 99 % 5\' 4"  (1.626 m) 126.2 kg   No intake/output data recorded. No intake/output data recorded.      General : Alert, cooperative, no distress, appears stated age   Head:  Normocephalic/atraumatic    Eyes: PERRL, conjunctiva/corneas clear, EOM's intact. Fundi could not be visualized Neck: Supple Chest:  Respirations unlabored Chest wall: no tenderness or deformity Heart: Regular rate and rhythm Abdomen: Soft, nontender and nondistended Extremities: warm and well-perfused Skin: normal turgor, color and texture Neurologic:  Alert, oriented x 3.  Eyes open spontaneously. PERRL, EOMI, VFC, no facial droop. V1-3 intact.  No dysarthria, tongue protrusion symmetric.  CNII-XII intact. Normal strength, sensation and reflexes throughout.  No pronator drift, full strength in legs + SLR on R Some numbness, L5 distribution       Data Review CBC:  Lab Results  Component Value Date   WBC 13.2 (H) 05/06/2021   RBC 5.04 05/06/2021   BMP:  Lab Results  Component Value Date   GLUCOSE 115 (H) 05/06/2021   CO2 25 05/06/2021   BUN 8  05/06/2021   BUN 12 06/20/2019   CREATININE 0.81 05/06/2021   CREATININE 0.94 06/15/2019   CALCIUM 9.1 05/06/2021   Radiology review: R L5 screw with medial breach noted  Assessment:  S/p L4-5 OLIF with R leg radiculopathy  Plan:   - plan for revision of right sided instrumentation and fusion

## 2021-05-08 NOTE — Op Note (Signed)
Procedure(s): Revision of instrumentation and posterolateral fusion right Lumbar Four- Lumbar Five Procedure Note  Marilea Gwynne Moncrieffe female 55 y.o. 05/08/2021  Procedure(s) and Anesthesia Type:    * Revision of instrumentation and posterolateral fusion right Lumbar Four- Lumbar Five - General  Surgeon(s) and Role:    Marcello Moores, Dorcas Carrow, MD - Primary    * Erline Levine, MD - Assisting   Indications: This is a 55 year old woman with morbid obesity who is status post L4-5 OLIF.  She did well postoperatively, but developed right leg radiculopathy several months after surgery.  A CT scan showed a slight medial breach of the right L5 pedicle screw.  As there was concern that this was irritating her L5 nerve root, and her pain did not improve with nonsurgical measures, revision of her right-sided instrumentation and fusion was recommended.  Risks, benefits, alternatives, and expected convalescence were discussed with her.  Risk discussed included, but were not limited to, bleeding, pain, infection, scar, pseudoarthrosis, adjacent segment disease, and death.  Informed consent was obtained.     Surgeon: Vallarie Mare   Assistants: Erline Levine, MD  Anesthesia: General endotracheal anesthesia   Procedure Detail  1.  Right L4-5 posterolateral arthrodesis 2.  Revision of right L4-5 nonsegmental instrumentation 3.  Use of computer-assisted navigation with Medtronic O-arm and Stealth. 4.  Use of morselized allograft  The patient was brought to the room.  After general anesthesia was induced and appropriate lines were monitored, the patient was intubated by the anesthesia service.  She was positioned prone on a pro axis bed placed in slight flexion to reduce the distance from the skin to her instrumentation.  With her morbid obesity, her skin was also taped to the bed to decrease the depth of the wound.  Her lower back was preprepped with alcohol and prepped and draped in sterile fashion.  A  timeout was performed.  Preoperative antibiotics were administered.  Her right-sided paramedian incision was opened with a 10 blade and slightly extended superiorly and inferiorly.  The fascia was opened sharply and a [place] approach was taken down to expose the right L4 and L5 pedicle screws and instrumentation.  The screw caps and rods were removed and the right L5 screw was removed.  No significant bleeding or clear fluid was seen emanating from the hole.  A left PSIS pin was then placed and O-arm was used to obtain an intraoperative CT scan and perform registration with the Stealth system.  Using the Stealth system, a high-speed drill was used to create a pilot hole at the junction of the superior articulating process and exposed transverse processes at L5 and cannulate the pedicle, which was very corticated along the medial margin.  A awl tip tap was then used under navigation to cannulate the pedicle.  A 6.5 x 45 mm screw was then placed using navigation and good purchase was noted.  The transverse processes and facets and pars were then decorticated with a high-speed drill.  DBF morselized allograft was placed in the lateral gutters.  Rod was then replaced and screw caps were placed and final tightened.  Another intraoperative CT was performed and confirmed good placement of the instrumentation.  Wound was irrigated thoroughly.  The fascia was closed with 0 Vicryl stitches.  The subcutaneous layer was closed with 0 Vicryl stitches.  Dermal layer was closed with 2-0 Vicryl stitches in buried fashion.  Skin was closed with 4 Monocryl in subcuticular manner followed by Dermabond and a sterile  dressing.  The pin was removed and the small stab incision was closed with 4-0 Monocryl and Dermabond.  Patient was then flipped supine and extubated by the anesthesia service.  All counts were correct at the end of surgery.  No complications were noted.  Findings: Successful revision of instrumentation  Estimated  Blood Loss: 25 mL         Drains: None         Total IV Fluids: See anesthesia records  Blood Given: none          Specimens: None         Implants: Medtronic pedicle screw 6.5 x 45 mm at right L5, with screw caps And rod        Complications:  * No complications entered in OR log *         Disposition: PACU - hemodynamically stable.         Condition: stable

## 2021-05-08 NOTE — Anesthesia Procedure Notes (Addendum)
Procedure Name: Intubation Date/Time: 05/08/2021 7:50 AM Performed by: Daiva Huge, RN Pre-anesthesia Checklist: Patient identified, Emergency Drugs available, Suction available and Patient being monitored Patient Re-evaluated:Patient Re-evaluated prior to induction Oxygen Delivery Method: Circle system utilized Preoxygenation: Pre-oxygenation with 100% oxygen Induction Type: IV induction Ventilation: Mask ventilation without difficulty Laryngoscope Size: Mac and 3 Grade View: Grade I Tube type: Oral Number of attempts: 1 Airway Equipment and Method: Stylet and Oral airway Placement Confirmation: ETT inserted through vocal cords under direct vision,  positive ETCO2 and breath sounds checked- equal and bilateral Secured at: 21 cm Tube secured with: Tape Dental Injury: Teeth and Oropharynx as per pre-operative assessment  Comments: Intubation by Genia Hotter

## 2021-05-09 ENCOUNTER — Encounter: Payer: Medicare Other | Admitting: Internal Medicine

## 2021-05-09 MED ORDER — DOCUSATE SODIUM 100 MG PO CAPS: 100.0000 mg | ORAL_CAPSULE | Freq: Two times a day (BID) | ORAL | 2 refills | Status: DC

## 2021-05-09 NOTE — Discharge Summary (Signed)
  Physician Discharge Summary  Patient ID: Sarah Randall MRN: [id number] DOB/AGE: 1966-03-03 55 y.o.  Admit date: 05/08/2021 Discharge date: 05/09/2021  Admission Diagnoses:  Lumbar radiculopathy  Discharge Diagnoses:  Same Active Problems:   Lumbar radiculopathy   Discharged Condition: Stable  Hospital Course:  Sarah Randall is a 55 y.o. female with history of L4-5 OLIF who developed right leg radiculopathy.  She underwent revision of right L4-5 instrumentation and arthrodesis.  Postoperatively, she was admitted to the spine unit where she was mobilized with the help of physical therapy.  She was ambulating without difficulty.  She reports her preoperative radiculopathy appears to have fully resolved.  She was deemed ready for discharge home.  Treatments: Surgery -revision of right L4-5 fusion and instrumentation  Discharge Exam: Blood pressure (!) 145/96, pulse 86, temperature 98.1 F (36.7 C), temperature source Oral, resp. rate 18, height 5\' 4"  (1.626 m), weight 126.2 kg, SpO2 100 %. Awake, alert, oriented Speech fluent, appropriate CN grossly intact 5/5 BUE/BLE Wound c/d/i  Disposition: Discharge disposition: 01-Home or Self Care       Discharge Instructions    Incentive spirometry RT   Complete by: As directed      Allergies as of 05/09/2021      Reactions   Pregabalin Swelling   Cymbalta [duloxetine Hcl] Nausea Only   Gabapentin Swelling   Lipitor [atorvastatin] Other (See Comments)   Stabbing pains in legs   Pravachol [pravastatin]    Sharp pains in LT leg   Zoloft [sertraline Hcl] Other (See Comments)   tremors      Medication List    STOP taking these medications   meloxicam 7.5 MG tablet Commonly known as: Mobic     TAKE these medications   B-12 5000 MCG Caps Take 5,000 mcg by mouth every other day.   docusate sodium 100 MG capsule Commonly known as: COLACE Take 1 capsule (100 mg total) by mouth 2 (two) times daily.   esomeprazole 40 MG  capsule Commonly known as: NEXIUM TAKE 1 CAPSULE BY MOUTH EVERY DAY What changed: how much to take   hydrochlorothiazide 25 MG tablet Commonly known as: HYDRODIURIL TAKE 1 TABLET BY MOUTH EVERY DAY   losartan 50 MG tablet Commonly known as: COZAAR TAKE 1 TABLET BY MOUTH EVERY DAY   methocarbamol 500 MG tablet Commonly known as: Robaxin Take 2 tablets (1,000 mg total) by mouth every 6 (six) hours as needed for muscle spasms.   nitroGLYCERIN 0.4 MG SL tablet Commonly known as: NITROSTAT Place 1 tablet (0.4 mg total) under the tongue every 5 (five) minutes as needed.   oxyCODONE-acetaminophen 5-325 MG tablet Commonly known as: PERCOCET/ROXICET Take 1-2 tablets by mouth every 6 (six) hours as needed for moderate pain or severe pain.   propranolol 10 MG tablet Commonly known as: INDERAL TAKE 1 TABLET BY MOUTH TWICE A DAY What changed: when to take this        Signed: Vallarie Mare 05/09/2021, 7:45 AM

## 2021-05-09 NOTE — Evaluation (Signed)
Physical Therapy Evaluation Patient Details Name: Sarah Randall MRN: [id number] DOB: August 06, 1966 Today's Date: 05/09/2021   History of Present Illness  Sarah Randall is a 55 y.o. female admitted 6/2 for revision of R L4-L5 instrumentation and arthrodesis. pt with history of L4-5 OLIF who developed right leg radiculopathy.  Clinical Impression  Pt admitted following surgery. Pt I PTA with occasional use of cane. Pt mod I during mobility wtihout AD. Pt is safe to d/c home with husband. No further acute PT needs noted    Follow Up Recommendations No PT follow up    Equipment Recommendations  None recommended by PT    Recommendations for Other Services       Precautions / Restrictions Precautions Precautions: Fall Restrictions Weight Bearing Restrictions: No Other Position/Activity Restrictions: no brace. No spinal precautions ordered. Education on posture      Mobility  Bed Mobility Overal bed mobility: Independent                  Transfers Overall transfer level: Modified independent Equipment used: None                Ambulation/Gait Ambulation/Gait assistance: Modified independent (Device/Increase time) Gait Distance (Feet): 400 Feet Assistive device: None   Gait velocity: decreased velocity   General Gait Details: increased lateral movement during ambulation  Stairs            Wheelchair Mobility    Modified Rankin (Stroke Patients Only)       Balance                                             Pertinent Vitals/Pain Pain Assessment: 0-10 Pain Score: 7  Pain Location: incisional    Home Living Family/patient expects to be discharged to:: Private residence Living Arrangements: Spouse/significant other Available Help at Discharge: Family;Available 24 hours/day Type of Home: Apartment       Home Layout: One level Home Equipment: Walker - 2 wheels;Cane - quad;Cane - single point;Toilet riser;Shower seat;Grab  bars - toilet;Grab bars - tub/shower;Hand held shower head      Prior Function Level of Independence: Independent         Comments: occasional [place] of cane     Hand Dominance   Dominant Hand: Left    Extremity/Trunk Assessment   Upper Extremity Assessment Upper Extremity Assessment: Defer to OT evaluation    Lower Extremity Assessment Lower Extremity Assessment: Overall WFL for tasks assessed       Communication   Communication: No difficulties  Cognition Arousal/Alertness: Awake/alert Behavior During Therapy: WFL for tasks assessed/performed Overall Cognitive Status: Within Functional Limits for tasks assessed                                        General Comments General comments (skin integrity, edema, etc.): VSS    Exercises     Assessment/Plan    PT Assessment Patent does not need any further PT services  PT Problem List         PT Treatment Interventions      PT Goals (Current goals can be found in the Care Plan section)  Acute Rehab PT Goals Patient Stated Goal: I want to go home today PT Goal Formulation: With patient Time For Goal Achievement:  05/09/21 Potential to Achieve Goals: Good    Frequency     Barriers to discharge        Co-evaluation               AM-PAC PT "6 Clicks" Mobility  Outcome Measure Help needed turning from your back to your side while in a flat bed without using bedrails?: None Help needed moving from lying on your back to sitting on the side of a flat bed without using bedrails?: None Help needed moving to and from a bed to a chair (including a wheelchair)?: None Help needed standing up from a chair using your arms (e.g., wheelchair or bedside chair)?: None Help needed to walk in hospital room?: None Help needed climbing 3-5 steps with a railing? : A Little 6 Click Score: 23    End of Session Equipment Utilized During Treatment: Gait belt Activity Tolerance: Patient tolerated treatment  well Patient left: in bed;with call bell/phone within reach Nurse Communication: Mobility status      Time: 2878-6767 PT Time Calculation (min) (ACUTE ONLY): 15 min   Charges:   PT Evaluation $PT Eval Low Complexity: [place], DPT Acute Rehabilitation Services [phone]  [place] 05/09/2021, 8:00 AM

## 2021-05-09 NOTE — Progress Notes (Signed)
Patient is discharged from room 3C07 at this time. Alert and in stable condition. IV site d/c'd and instructions read to patient and spouse with understanding verbalized and all questions answered. Left unit via wheelchair with all belongings at side. 

## 2021-05-09 NOTE — Evaluation (Signed)
Occupational Therapy Evaluation Patient Details Name: Sarah Randall MRN: [id number] DOB: 12-03-1966 Today's Date: 05/09/2021    History of Present Illness 55 y.o. female admitted 6/2 for revision of R L4-L5 instrumentation and arthrodesis. PMH including Anemia, Anxiety, Arthritis, Depression, GERD, leukocytosis, Hyperlipidemia, HTN, Low back pain, Obesity, Pre-diabetes, Sleep apnea, Spondylolisthesis at L4-L5 level, THA L (2018), TKA R (2019), and Anterior lumbar fusion (2022).   Clinical Impression   PTA, pt was living with her husband and was independent. Currently, pt performing ADLs and functional mobility at Mod I level. Provided education handout on back precautions, LB ADLs, toileting with AE, and tub transfer; pt demonstrated understanding. Answered all pt questions. Recommend dc home once medically stable per physician. All acute OT needs met and will sign off. Thank you.    Follow Up Recommendations  No OT follow up    Equipment Recommendations  None recommended by OT    Recommendations for Other Services PT consult     Precautions / Restrictions Precautions Precautions: Fall Precaution Comments: Provided handout for back precautions for comfort and pain relief Restrictions Weight Bearing Restrictions: No Other Position/Activity Restrictions: No brace per MD order      Mobility Bed Mobility Overal bed mobility: Independent                  Transfers Overall transfer level: Modified independent Equipment used: None                  Balance                                           ADL either performed or assessed with clinical judgement   ADL Overall ADL's : Modified independent                                       General ADL Comments: Providing education on back precautions, bed mobility, UB ADLs, LB ADLs, grooming, toileting, and shower transfer. Recommending use of toilet aid for peri care; pt verbalized  understanding.     Vision Baseline Vision/History: Wears glasses Patient Visual Report: No change from baseline       Perception     Praxis      Pertinent Vitals/Pain Pain Assessment: Faces Pain Score: 7  Faces Pain Scale: Hurts a little bit Pain Location: incisional Pain Intervention(s): Monitored during session     Hand Dominance Left   Extremity/Trunk Assessment Upper Extremity Assessment Upper Extremity Assessment: Overall WFL for tasks assessed   Lower Extremity Assessment Lower Extremity Assessment: Overall WFL for tasks assessed   Cervical / Trunk Assessment Cervical / Trunk Assessment: Other exceptions Cervical / Trunk Exceptions: revision of R L4-L5 instrumentation and arthrodesis   Communication Communication Communication: No difficulties   Cognition Arousal/Alertness: Awake/alert Behavior During Therapy: WFL for tasks assessed/performed Overall Cognitive Status: Within Functional Limits for tasks assessed                                     General Comments  VSS    Exercises     Shoulder Instructions      Home Living Family/patient expects to be discharged to:: Private residence Living Arrangements: Spouse/significant other Available Help at Discharge: Family;Available  24 hours/day Type of Home: Apartment       Home Layout: One level     Bathroom Shower/Tub: Tub/shower unit;Other (comment) (will be getting walk in tub today)   Bathroom Toilet: Standard Bathroom Accessibility: Yes   Home Equipment: Walker - 2 wheels;Cane - quad;Cane - single point;Toilet riser;Shower seat;Grab bars - toilet;Grab bars - tub/shower;Hand held shower head   Additional Comments: Husband plans to take a couple of weeks off to be with pt at home      Prior Functioning/Environment Level of Independence: Independent        Comments: occasional [place] of cane        OT Problem List: Decreased activity tolerance;Decreased range of  motion;Decreased knowledge of use of DME or AE;Decreased knowledge of precautions      OT Treatment/Interventions:      OT Goals(Current goals can be found in the care plan section) Acute Rehab OT Goals Patient Stated Goal: Home today OT Goal Formulation: All assessment and education complete, DC therapy  OT Frequency:     Barriers to D/C:            Co-evaluation              AM-PAC OT "6 Clicks" Daily Activity     Outcome Measure Help from another person eating meals?: None Help from another person taking care of personal grooming?: None Help from another person toileting, which includes using toliet, bedpan, or urinal?: None Help from another person bathing (including washing, rinsing, drying)?: None Help from another person to put on and taking off regular upper body clothing?: None Help from another person to put on and taking off regular lower body clothing?: None 6 Click Score: 24   End of Session Nurse Communication: Mobility status  Activity Tolerance: Patient tolerated treatment well Patient left: with call bell/phone within reach;in bed (EOB)  OT Visit Diagnosis: Muscle weakness (generalized) (M62.81)                Time: 8413-2440 OT Time Calculation (min): 24 min Charges:  OT General Charges $OT Visit: 1 Visit OT Evaluation $OT Eval Low Complexity: 1 Low OT Treatments $Self Care/Home Management : 8-22 mins  Sire Poet MSOT, OTR/L Acute Rehab Pager: [phone] Office: [place] 05/09/2021, 8:56 AM

## 2021-05-09 NOTE — Discharge Instructions (Signed)

## 2021-05-12 ENCOUNTER — Telehealth: Payer: Self-pay

## 2021-05-12 NOTE — Telephone Encounter (Signed)
Transition Care Management Follow-up Telephone Call  Date of discharge and from where: 05/09/2021, [facility]   How have you been since you were released from the hospital? She said she is sore but getting there...   Any questions or concerns? No  Items Reviewed:  Did the pt receive and understand the discharge instructions provided? Yes   Medications obtained and verified? Yes  she said she has all medications and did not have any questions about the med regime.   Other? No   Any new allergies since your discharge? No   Do you have support at home? Yes   Home Care and Equipment/Supplies: Were home health services ordered? no If so, what is the name of the agency? n/a  Has the agency set up a time to come to the patient's home? not applicable Were any new equipment or medical supplies ordered?  No What is the name of the medical supply agency? n/a Were you able to get the supplies/equipment? not applicable Do you have any questions related to the use of the equipment or supplies? No  Functional Questionnaire: (I = Independent and D = Dependent) ADLs: independent. Has help at home if needed and has been ambulating with a cane   Follow up appointments reviewed:   PCP Hospital f/u appt confirmed? Yes  - Dr Wynetta Emery 07/14/2021, [facility] f/u appt confirmed? Yes  - surgeon - 05/27/2021.    Are transportation arrangements needed? No   If their condition worsens, is the pt aware to call PCP or go to the Emergency Dept.? Yes  Was the patient provided with contact information for the PCP's office or ED? Yes  Was to pt encouraged to call back with questions or concerns? Yes

## 2021-05-12 NOTE — Anesthesia Postprocedure Evaluation (Signed)
Anesthesia Post Note  Patient: Sarah Randall  Procedure(s) Performed: Revision of instrumentation and posterolateral fusion right Lumbar Four- Lumbar Five (Right )     Patient location during evaluation: PACU Anesthesia Type: General Level of consciousness: awake and alert Pain management: pain level controlled Vital Signs Assessment: post-procedure vital signs reviewed and stable Respiratory status: spontaneous breathing, nonlabored ventilation, respiratory function stable and patient connected to nasal cannula oxygen Cardiovascular status: blood pressure returned to baseline and stable Postop Assessment: no apparent nausea or vomiting Anesthetic complications: no   No complications documented.  Last Vitals:  Vitals:   05/09/21 0405 05/09/21 0742  BP: (!) 148/91 (!) 145/96  Pulse: 80 86  Resp: 18 18  Temp: 36.7 C 36.7 C  SpO2: 99% 100%    Last Pain:  Vitals:   05/09/21 0950  TempSrc:   PainSc: 4                  [name]

## 2021-05-14 ENCOUNTER — Other Ambulatory Visit: Payer: Self-pay | Admitting: Internal Medicine

## 2021-05-14 DIAGNOSIS — I10 Essential (primary) hypertension: Secondary | ICD-10-CM

## 2021-05-28 ENCOUNTER — Other Ambulatory Visit: Payer: Self-pay | Admitting: Internal Medicine

## 2021-05-28 DIAGNOSIS — I10 Essential (primary) hypertension: Secondary | ICD-10-CM

## 2021-05-28 NOTE — Telephone Encounter (Signed)
  Notes to clinic:  Requesting a 90 day supply   Requested Prescriptions  Pending Prescriptions Disp Refills   losartan (COZAAR) 50 MG tablet [Pharmacy Med Name: LOSARTAN POTASSIUM 50 MG TAB] 90 tablet 1    Sig: TAKE 1 TABLET BY MOUTH EVERY DAY      Cardiovascular:  Angiotensin Receptor Blockers Failed - 05/28/2021  9:19 AM      Failed - K in normal range and within 180 days    Potassium  Date Value Ref Range Status  05/06/2021 3.4 (L) 3.5 - 5.1 mmol/L Final          Failed - Last BP in normal range    BP Readings from Last 1 Encounters:  05/09/21 (!) 145/96          Passed - Cr in normal range and within 180 days    Creatinine  Date Value Ref Range Status  06/15/2019 0.94 0.44 - 1.00 mg/dL Final  08/10/2017 150.1 20.0 - 300.0 mg/dL Final   Creatinine, Ser  Date Value Ref Range Status  05/06/2021 0.81 0.44 - 1.00 mg/dL Final          Passed - Patient is not pregnant      Passed - Valid encounter within last 6 months    Recent Outpatient Visits           2 months ago Essential hypertension   [place], MD   8 months ago Need for influenza vaccination   [place], Jarome Matin, RPH-CPP   8 months ago Essential hypertension   [place], Deborah B, MD   1 year ago Rhinovirus   [place], [place], NP   1 year ago [place], Deborah B, MD       Future Appointments             In 6 days Leandrew Koyanagi, MD [facility]   In 1 month Ladell Pier, MD [place]   In 1 month [place], Nadean Corwin, MD [facility] [place], [facility]

## 2021-06-03 ENCOUNTER — Ambulatory Visit (INDEPENDENT_AMBULATORY_CARE_PROVIDER_SITE_OTHER): Payer: Medicare Other | Admitting: Orthopaedic Surgery

## 2021-06-03 ENCOUNTER — Encounter: Payer: Self-pay | Admitting: Orthopaedic Surgery

## 2021-06-03 DIAGNOSIS — Z96642 Presence of left artificial hip joint: Secondary | ICD-10-CM

## 2021-06-03 NOTE — Progress Notes (Signed)
Office Visit Note   Patient: Sarah Randall           Date of Birth: 21-May-1966           MRN: [id number] Visit Date: 06/03/2021              Requested by: Ladell Pier, MD [address] [place],  [place] [address] PCP: Ladell Pier, MD   Assessment & Plan: Visit Diagnoses:  1. Status post total hip replacement, left     Plan: At this point we will we will need to obtain a bone scan to look for loosening of the hip replacement.  Follow-up after the bone scan.  Follow-Up Instructions: Return for Follow-up after bone scan.   Orders:  No orders of the defined types were placed in this encounter.  No orders of the defined types were placed in this encounter.     Procedures: No procedures performed   Clinical Data: No additional findings.   Subjective: Chief Complaint  Patient presents with   Left Hip - Pain    Sarah Randall is a 55 year old female who returns today for chronic left hip pain.  We saw her back in April and sent her to Dr. Junius Roads for a lateral hip injection which helped with the lateral pain but she continues to have groin and thigh pain that is worse with walking standing.  Otherwise no changes.   Review of Systems   Objective: Vital Signs: LMP  (LMP Unknown)   Physical Exam  Ortho Exam Left hip shows no pain with internal or external rotation in a seated position.  Increased pain with walking with a slight limp. Specialty Comments:  No specialty comments available.  Imaging: No results found.   PMFS History: Patient Active Problem List   Diagnosis Date Noted   Multiple joint pain 02/10/2021   Positive ANA (antinuclear antibody) 12/13/2020   Trigger finger of left thumb 12/10/2020   Lumbar radiculopathy 11/07/2020   Chronic right-sided low back pain with right-sided sciatica 10/10/2020   Sleep disturbance 10/10/2020   Myalgia 10/10/2020   Super obese 10/10/2020   Abnormality of gait 10/10/2020   Statin intolerance 09/17/2020    Recurrent boils 09/17/2020   Chronic bilateral low back pain 05/16/2020   Postoperative state 12/27/2019   Iron deficiency 08/03/2019   B12 deficiency 06/30/2019   Chronic right shoulder pain 07/05/2018   Trochanteric bursitis of left hip 07/05/2018   Morbid obesity ([place]) 07/05/2018   Body mass index 45.0-49.9, adult ([place]) 07/05/2018   Marijuana user 05/26/2018   Status post total right knee replacement 01/12/2018   Status post total hip replacement, left 09/22/2017   Hyperlipidemia 08/10/2017   Mild depression ([place]) 06/25/2017   Essential hypertension 06/25/2017   Tobacco abuse 06/25/2017   Prediabetes 06/25/2017   OSA on CPAP 06/25/2017   Bronchitis 09/24/2016   Leukocytosis 09/24/2016   Radiculopathy, lumbar region 07/16/2016   Degenerative lumbar disc 07/03/2016   Synovitis of hip 04/20/2016   Screening for breast cancer 12/28/2014   Pain in joint involving lower leg 05/13/2013   Past Medical History:  Diagnosis Date   Anemia    Anxiety    Arthritis    B12 deficiency 06/30/2019   Depression    GERD (gastroesophageal reflux disease)    occasionally takes OTC   History of leukocytosis    Hyperlipidemia    Hypertension    Low back pain    Obesity    Pain in right buttock  Goes down to Right leg to lateral aspect of calf.   Pre-diabetes    Sleep apnea    tested 2014 - unable to tolerate machine   Spondylolisthesis at L4-L5 level     Family History  Problem Relation Age of Onset   Hypertension Mother    Diabetes Mother    Breast cancer Mother 65   Hypertension Father    Lung cancer Maternal Aunt    Colon polyps Brother    Colon cancer Neg Hx    Esophageal cancer Neg Hx    Rectal cancer Neg Hx    Stomach cancer Neg Hx     Past Surgical History:  Procedure Laterality Date   ABDOMINAL EXPOSURE N/A 01/22/2021   Procedure: LATERAL EXPOSURE FOR OBLIQUE LATERAL INTERBODY FUSION;  Surgeon: Marty Heck, MD;  Location: [facility];  Service: Vascular;   Laterality: N/A;   ABDOMINAL HYSTERECTOMY     ANTERIOR LUMBAR FUSION N/A 01/22/2021   Procedure: Oblique Lateral Interbody Fusion Lumbar Four-Lumbar Five;  Surgeon: Vallarie Mare, MD;  Location: [facility];  Service: Neurosurgery;  Laterality: N/A;   LEFT HEART CATH AND CORONARY ANGIOGRAPHY N/A 10/25/2018   Procedure: LEFT HEART CATH AND CORONARY ANGIOGRAPHY;  Surgeon: [place], Peter M, MD;  Location: [facility];  Service: Cardiovascular;  Laterality: N/A;   LUMBAR PERCUTANEOUS PEDICLE SCREW 1 LEVEL N/A 01/22/2021   Procedure: LUMBAR PERCUTANEOUS PEDICLE SCREW LUBAR FOUR- FIVE;  Surgeon: Vallarie Mare, MD;  Location: [facility];  Service: Neurosurgery;  Laterality: N/A;   ROBOTIC ASSISTED LAPAROSCOPIC HYSTERECTOMY AND SALPINGECTOMY Bilateral 12/27/2019   Procedure: XI ROBOTIC ASSISTED TOTAL LAPAROSCOPIC HYSTERECTOMY AND SALPINGECTOMY;  Surgeon: Princess Bruins, MD;  Location: [facility];  Service: Gynecology;  Laterality: Bilateral;   TOTAL HIP ARTHROPLASTY Left 09/22/2017   Procedure: LEFT TOTAL HIP ARTHROPLASTY ANTERIOR APPROACH;  Surgeon: Leandrew Koyanagi, MD;  Location: [facility];  Service: Orthopedics;  Laterality: Left;   TOTAL KNEE ARTHROPLASTY Right 01/12/2018   Procedure: RIGHT TOTAL KNEE ARTHROPLASTY;  Surgeon: Leandrew Koyanagi, MD;  Location: [facility];  Service: Orthopedics;  Laterality: Right;   TUBAL LIGATION     Social History   Occupational History   Occupation: unemployed  Tobacco Use   Smoking status: Every Day    Packs/day: 0.25    Years: 30.00    Pack years: 7.50    Types: Cigarettes   Smokeless tobacco: Never  Vaping Use   Vaping Use: Never used  Substance and Sexual Activity   Alcohol use: Yes    Alcohol/week: 4.0 - 6.0 standard drinks    Types: 4 - 6 Shots of liquor per week    Comment: occ   Drug use: Yes    Frequency: 2.0 times per week    Types: Marijuana   Sexual activity: Yes    Birth control/protection: None    Comment: declined insurance  questions,des neg

## 2021-06-12 ENCOUNTER — Other Ambulatory Visit: Payer: Self-pay | Admitting: Orthopaedic Surgery

## 2021-06-12 DIAGNOSIS — T84038A Mechanical loosening of other internal prosthetic joint, initial encounter: Secondary | ICD-10-CM

## 2021-06-17 ENCOUNTER — Other Ambulatory Visit: Payer: Self-pay | Admitting: Internal Medicine

## 2021-06-17 DIAGNOSIS — I10 Essential (primary) hypertension: Secondary | ICD-10-CM

## 2021-06-17 NOTE — Telephone Encounter (Signed)
  Notes to clinic:  Patient has appt on 07/14/2021 Appointment from 05/09/2021 was canceled  Review for 90 day    Requested Prescriptions  Pending Prescriptions Disp Refills   losartan (COZAAR) 50 MG tablet 90 tablet 0    Sig: Take 1 tablet (50 mg total) by mouth daily.      Cardiovascular:  Angiotensin Receptor Blockers Failed - 06/17/2021 12:02 PM      Failed - K in normal range and within 180 days    Potassium  Date Value Ref Range Status  05/06/2021 3.4 (L) 3.5 - 5.1 mmol/L Final          Failed - Last BP in normal range    BP Readings from Last 1 Encounters:  05/09/21 (!) 145/96          Passed - Cr in normal range and within 180 days    Creatinine  Date Value Ref Range Status  06/15/2019 0.94 0.44 - 1.00 mg/dL Final  08/10/2017 150.1 20.0 - 300.0 mg/dL Final   Creatinine, Ser  Date Value Ref Range Status  05/06/2021 0.81 0.44 - 1.00 mg/dL Final          Passed - Patient is not pregnant      Passed - Valid encounter within last 6 months    Recent Outpatient Visits           3 months ago Essential hypertension   [place], MD   9 months ago Need for influenza vaccination   [place], Jarome Matin, RPH-CPP   9 months ago Essential hypertension   [place], Deborah B, MD   1 year ago Rhinovirus   [place], [place], NP   1 year ago [place], Deborah B, MD       Future Appointments             In 3 weeks Ladell Pier, MD [place]   In 1 month [place], Nadean Corwin, MD [place], [facility]

## 2021-06-17 NOTE — Telephone Encounter (Signed)
Pharmacist requesting a 90 day supply of losartan (COZAAR) 50 MG tablet    [id number] [name], [place] Phone:  [phone]  Fax:  [phone]

## 2021-06-18 ENCOUNTER — Other Ambulatory Visit: Payer: Self-pay

## 2021-06-19 ENCOUNTER — Other Ambulatory Visit: Payer: Self-pay | Admitting: Internal Medicine

## 2021-06-19 DIAGNOSIS — I10 Essential (primary) hypertension: Secondary | ICD-10-CM

## 2021-06-19 DIAGNOSIS — K219 Gastro-esophageal reflux disease without esophagitis: Secondary | ICD-10-CM

## 2021-06-19 MED ORDER — LOSARTAN POTASSIUM 50 MG PO TABS: 50.0000 mg | ORAL_TABLET | Freq: Every day | ORAL | 0 refills | Status: DC

## 2021-06-19 NOTE — Telephone Encounter (Signed)
Copied from [place] [id number]. Topic: Quick Communication - Rx Refill/Question >> Jun 19, 2021  9:35 AM Yvette Rack wrote: [facility] requests 90 day supply  Medication: losartan (COZAAR) 50 MG tablet  Has the patient contacted their pharmacy? Yes.   (Agent: If no, request that the patient contact the pharmacy for the refill.) (Agent: If yes, when and what did the pharmacy advise?)  Preferred Pharmacy (with phone number or street name): [id number] [name], [place] Phone: [phone]   Fax: [phone]  Agent: Please be advised that RX refills may take up to 3 business days. We ask that you follow-up with your pharmacy.

## 2021-06-19 NOTE — Telephone Encounter (Signed)
  [facility] requests 90 day supply    Requested Prescriptions  Pending Prescriptions Disp Refills   losartan (COZAAR) 50 MG tablet 30 tablet 1    Sig: Take 1 tablet (50 mg total) by mouth daily.      Cardiovascular:  Angiotensin Receptor Blockers Failed - 06/19/2021  9:42 AM      Failed - K in normal range and within 180 days    Potassium  Date Value Ref Range Status  05/06/2021 3.4 (L) 3.5 - 5.1 mmol/L Final          Failed - Last BP in normal range    BP Readings from Last 1 Encounters:  05/09/21 (!) 145/96          Passed - Cr in normal range and within 180 days    Creatinine  Date Value Ref Range Status  06/15/2019 0.94 0.44 - 1.00 mg/dL Final  08/10/2017 150.1 20.0 - 300.0 mg/dL Final   Creatinine, Ser  Date Value Ref Range Status  05/06/2021 0.81 0.44 - 1.00 mg/dL Final          Passed - Patient is not pregnant      Passed - Valid encounter within last 6 months    Recent Outpatient Visits           3 months ago Essential hypertension   [place], MD   9 months ago Need for influenza vaccination   [place], Jarome Matin, RPH-CPP   9 months ago Essential hypertension   [place], Deborah B, MD   1 year ago Rhinovirus   [place], [place], NP   1 year ago [place], Deborah B, MD       Future Appointments             In 3 weeks Ladell Pier, MD [place]   In 1 month [place], Nadean Corwin, MD [place], [facility]

## 2021-06-23 ENCOUNTER — Encounter (HOSPITAL_COMMUNITY)
Admission: RE | Admit: 2021-06-23 | Discharge: 2021-06-23 | Disposition: A | Discharge status: Home / Self Care | Payer: Medicare Other | Source: Ambulatory Visit | Attending: Orthopaedic Surgery | Admitting: Orthopaedic Surgery

## 2021-06-23 ENCOUNTER — Other Ambulatory Visit: Payer: Self-pay

## 2021-06-23 DIAGNOSIS — Z96642 Presence of left artificial hip joint: Secondary | ICD-10-CM | POA: Diagnosis not present

## 2021-06-23 DIAGNOSIS — M25552 Pain in left hip: Secondary | ICD-10-CM | POA: Diagnosis not present

## 2021-06-23 DIAGNOSIS — T84038A Mechanical loosening of other internal prosthetic joint, initial encounter: Secondary | ICD-10-CM | POA: Insufficient documentation

## 2021-06-23 DIAGNOSIS — Z96649 Presence of unspecified artificial hip joint: Secondary | ICD-10-CM | POA: Insufficient documentation

## 2021-06-23 DIAGNOSIS — Z471 Aftercare following joint replacement surgery: Secondary | ICD-10-CM | POA: Diagnosis not present

## 2021-06-23 IMAGING — NM NM BONE 3 PHASE
10 series · 20 of 20 positions shown · non-contrast
Comparison: None.

CLINICAL DATA: Left total hip arthroplasty [68] now with left hip
pain x6 months

EXAM:
NUCLEAR MEDICINE 3-PHASE BONE SCAN
TECHNIQUE: Radionuclide angiographic images, immediate static blood pool
images, and 3-hour delayed static images were obtained of the hips
bilaterally after intravenous injection of radiopharmaceutical.
RADIOPHARMACEUTICALS:  21.4 mCi [68] MDP IV

[Series 1: delay · delayed · 2.07mm/px · 1 of 1 slices shown (1 of 4)]
[im 1/1]
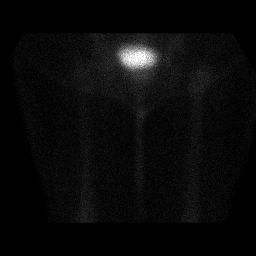

[Series 1: delay · delayed · 2.07mm/px · 1 of 1 slices shown (2 of 4)]
[im 1/1]
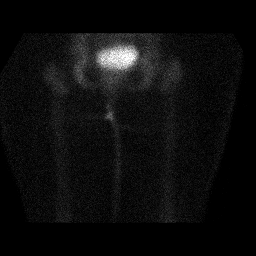

[Series 1: flow · 2.07mm/px · 6 of 48 frames shown (1 of 2)]
[frame 5/48  full-range]
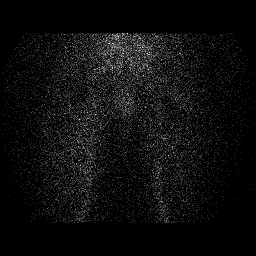
[frame 13/48  full-range]
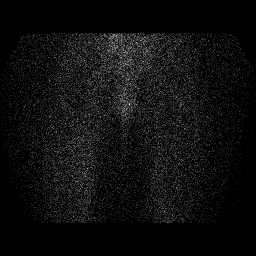
[frame 21/48  full-range]
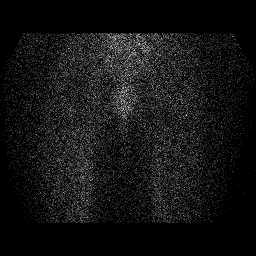
[frame 29/48  full-range]
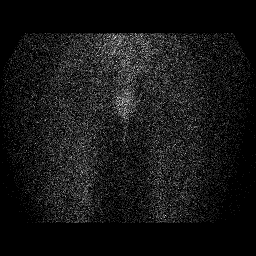
[frame 37/48  full-range]
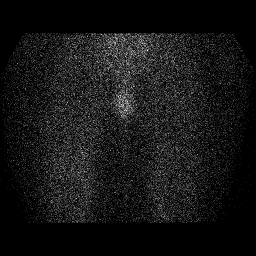
[frame 45/48  full-range]
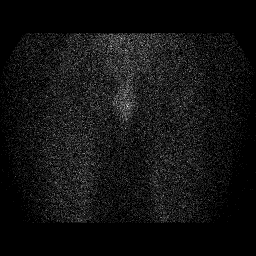

[Series 1: flow · 2.07mm/px · 6 of 48 frames shown (2 of 2)]
[frame 5/48  full-range]
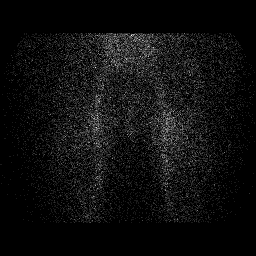
[frame 13/48  full-range]
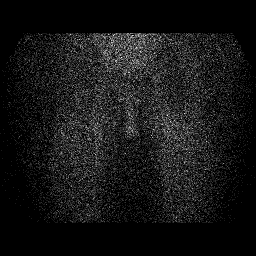
[frame 21/48  full-range]
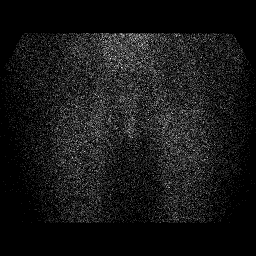
[frame 29/48  full-range]
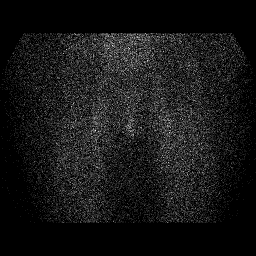
[frame 37/48  full-range]
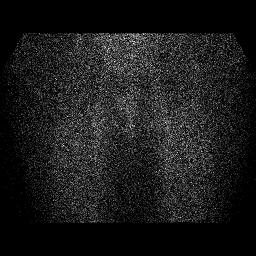
[frame 45/48  full-range]
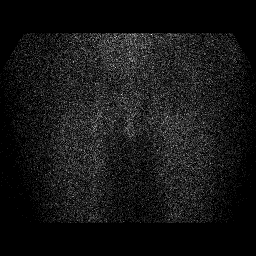

[Series 2: blood pool · 2.07mm/px · 1 of 1 slices shown (1 of 2)]
[im 1/1  full-range]
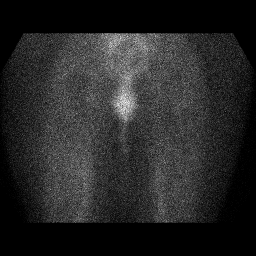

[Series 2: delay · delayed · 2.07mm/px · 1 of 1 slices shown (3 of 4)]
[im 1/1  full-range]
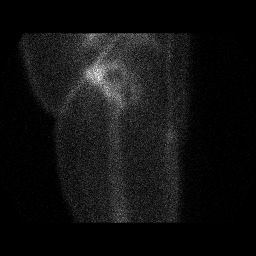

[Series 2: blood pool · 2.07mm/px · 1 of 1 slices shown (2 of 2)]
[im 1/1  full-range]
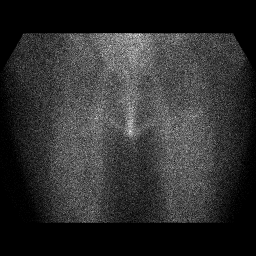

[Series 2: delay · delayed · 2.07mm/px · 1 of 1 slices shown (4 of 4)]
[im 1/1]
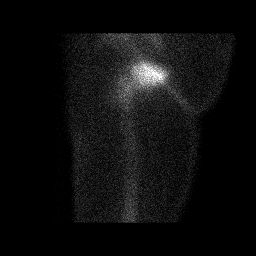

[Series 3: lat bp · 2.07mm/px · 1 of 1 slices shown (1 of 2)]
[im 1/1]
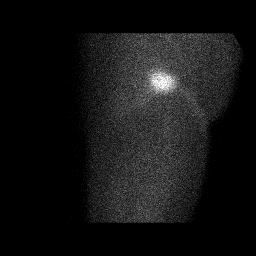

[Series 3: lat bp · 2.07mm/px · 1 of 1 slices shown (2 of 2)]
[im 1/1]
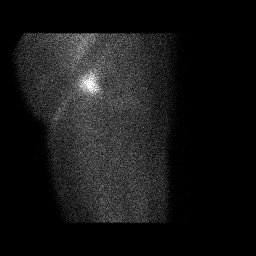

[20 of 20 positions shown; findings below may reference images not displayed]

FINDINGS: Vascular phase: No asymmetric perfusion of the hips identified

Blood pool phase: Symmetric distribution of radiotracer. No
asymmetric hyperemia.

Delayed phase: Defect related to left total hip arthroplasty noted.
Otherwise normal distribution of radiotracer within the visualized
appendicular skeleton. No asymmetric or focal uptake of radiotracer
identified.
IMPRESSION: Expected postoperative changes of left total hip arthroplasty. No
evidence of aseptic loosening or infection.

## 2021-06-23 MED ORDER — TECHNETIUM TC 99M MEDRONATE IV KIT
20.0000 | PACK | Freq: Once | INTRAVENOUS | Status: AC | PRN
  Administered 2021-06-23: 21.4 via INTRAVENOUS

## 2021-06-24 DIAGNOSIS — M4316 Spondylolisthesis, lumbar region: Secondary | ICD-10-CM | POA: Diagnosis not present

## 2021-06-24 DIAGNOSIS — M5416 Radiculopathy, lumbar region: Secondary | ICD-10-CM | POA: Diagnosis not present

## 2021-07-01 ENCOUNTER — Encounter: Payer: Self-pay | Admitting: Orthopaedic Surgery

## 2021-07-01 ENCOUNTER — Ambulatory Visit (INDEPENDENT_AMBULATORY_CARE_PROVIDER_SITE_OTHER): Payer: Medicare Other | Admitting: Orthopaedic Surgery

## 2021-07-01 VITALS — Ht 64.0 in | Wt 278.0 lb

## 2021-07-01 DIAGNOSIS — M25552 Pain in left hip: Secondary | ICD-10-CM

## 2021-07-01 NOTE — Progress Notes (Signed)
Patient: Sarah Randall           Date of Birth: 20-Apr-1966           MRN: [id number] Visit Date: 07/01/2021 PCP: Ladell Pier, MD   Assessment & Plan:  Chief Complaint:  Chief Complaint  Patient presents with   Left Hip - Follow-up    Bone scan review   Visit Diagnoses:  1. Pain in left hip     Plan: Any returns today for bone scan review of the left hip.  Left hip exam is unchanged.  Bone scan is negative for loosening.  Given chronic lateral hip pain impression is abductor tendinosis and likely partial-thickness tearing.  She understands the importance of weight loss and how this will improve her symptoms overall.  I do not believe it is necessary for an MRI.  In she will continue with home exercises.  Follow-up as needed.  Follow-Up Instructions: Return if symptoms worsen or fail to improve.   Orders:  No orders of the defined types were placed in this encounter.  No orders of the defined types were placed in this encounter.   Imaging: No results found.  PMFS History: Patient Active Problem List   Diagnosis Date Noted   Multiple joint pain 02/10/2021   Positive ANA (antinuclear antibody) 12/13/2020   Trigger finger of left thumb 12/10/2020   Lumbar radiculopathy 11/07/2020   Chronic right-sided low back pain with right-sided sciatica 10/10/2020   Sleep disturbance 10/10/2020   Myalgia 10/10/2020   Super obese 10/10/2020   Abnormality of gait 10/10/2020   Statin intolerance 09/17/2020   Recurrent boils 09/17/2020   Chronic bilateral low back pain 05/16/2020   Postoperative state 12/27/2019   Iron deficiency 08/03/2019   B12 deficiency 06/30/2019   Chronic right shoulder pain 07/05/2018   Trochanteric bursitis of left hip 07/05/2018   Morbid obesity ([place]) 07/05/2018   Body mass index 45.0-49.9, adult ([place]) 07/05/2018   Marijuana user 05/26/2018   Status post total right knee replacement 01/12/2018   Status post total hip replacement, left  09/22/2017   Hyperlipidemia 08/10/2017   Mild depression ([place]) 06/25/2017   Essential hypertension 06/25/2017   Tobacco abuse 06/25/2017   Prediabetes 06/25/2017   OSA on CPAP 06/25/2017   Bronchitis 09/24/2016   Leukocytosis 09/24/2016   Radiculopathy, lumbar region 07/16/2016   Degenerative lumbar disc 07/03/2016   Synovitis of hip 04/20/2016   Screening for breast cancer 12/28/2014   Pain in joint involving lower leg 05/13/2013   Past Medical History:  Diagnosis Date   Anemia    Anxiety    Arthritis    B12 deficiency 06/30/2019   Depression    GERD (gastroesophageal reflux disease)    occasionally takes OTC   History of leukocytosis    Hyperlipidemia    Hypertension    Low back pain    Obesity    Pain in right buttock    Goes down to Right leg to lateral aspect of calf.   Pre-diabetes    Sleep apnea    tested 2014 - unable to tolerate machine   Spondylolisthesis at L4-L5 level     Family History  Problem Relation Age of Onset   Hypertension Mother    Diabetes Mother    Breast cancer Mother 18   Hypertension Father    Lung cancer Maternal Aunt    Colon polyps Brother    Colon cancer Neg Hx    Esophageal cancer Neg Hx  Rectal cancer Neg Hx    Stomach cancer Neg Hx     Past Surgical History:  Procedure Laterality Date   ABDOMINAL EXPOSURE N/A 01/22/2021   Procedure: LATERAL EXPOSURE FOR OBLIQUE LATERAL INTERBODY FUSION;  Surgeon: Marty Heck, MD;  Location: [place];  Service: Vascular;  Laterality: N/A;   ABDOMINAL HYSTERECTOMY     ANTERIOR LUMBAR FUSION N/A 01/22/2021   Procedure: Oblique Lateral Interbody Fusion Lumbar Four-Lumbar Five;  Surgeon: Vallarie Mare, MD;  Location: [facility];  Service: Neurosurgery;  Laterality: N/A;   LEFT HEART CATH AND CORONARY ANGIOGRAPHY N/A 10/25/2018   Procedure: LEFT HEART CATH AND CORONARY ANGIOGRAPHY;  Surgeon: Martinique, Peter M, MD;  Location: [place];  Service: Cardiovascular;  Laterality: N/A;    LUMBAR PERCUTANEOUS PEDICLE SCREW 1 LEVEL N/A 01/22/2021   Procedure: LUMBAR PERCUTANEOUS PEDICLE SCREW LUBAR FOUR- FIVE;  Surgeon: Vallarie Mare, MD;  Location: [place];  Service: Neurosurgery;  Laterality: N/A;   ROBOTIC ASSISTED LAPAROSCOPIC HYSTERECTOMY AND SALPINGECTOMY Bilateral 12/27/2019   Procedure: XI ROBOTIC ASSISTED TOTAL LAPAROSCOPIC HYSTERECTOMY AND SALPINGECTOMY;  Surgeon: Princess Bruins, MD;  Location: [facility];  Service: Gynecology;  Laterality: Bilateral;   TOTAL HIP ARTHROPLASTY Left 09/22/2017   Procedure: LEFT TOTAL HIP ARTHROPLASTY ANTERIOR APPROACH;  Surgeon: Leandrew Koyanagi, MD;  Location: [place];  Service: Orthopedics;  Laterality: Left;   TOTAL KNEE ARTHROPLASTY Right 01/12/2018   Procedure: RIGHT TOTAL KNEE ARTHROPLASTY;  Surgeon: Leandrew Koyanagi, MD;  Location: [place];  Service: Orthopedics;  Laterality: Right;   TUBAL LIGATION     Social History   Occupational History   Occupation: unemployed  Tobacco Use   Smoking status: Every Day    Packs/day: 0.25    Years: 30.00    Pack years: 7.50    Types: Cigarettes   Smokeless tobacco: Never  Vaping Use   Vaping Use: Never used  Substance and Sexual Activity   Alcohol use: Yes    Alcohol/week: 4.0 - 6.0 standard drinks    Types: 4 - 6 Shots of liquor per week    Comment: occ   Drug use: Yes    Frequency: 2.0 times per week    Types: Marijuana   Sexual activity: Yes    Birth control/protection: None    Comment: declined insurance questions,des neg

## 2021-07-13 ENCOUNTER — Other Ambulatory Visit: Payer: Self-pay | Admitting: Internal Medicine

## 2021-07-13 DIAGNOSIS — I10 Essential (primary) hypertension: Secondary | ICD-10-CM

## 2021-07-13 NOTE — Telephone Encounter (Signed)
last RF 06/19/21 #30 no RF "must keep upcoming office visit for RF"

## 2021-07-14 ENCOUNTER — Ambulatory Visit: Payer: Medicare Other | Admitting: Internal Medicine

## 2021-07-15 ENCOUNTER — Ambulatory Visit: Payer: Medicare Other | Admitting: Internal Medicine

## 2021-07-15 ENCOUNTER — Other Ambulatory Visit: Payer: Self-pay

## 2021-07-23 ENCOUNTER — Ambulatory Visit: Payer: Medicare Other | Admitting: Internal Medicine

## 2021-08-06 ENCOUNTER — Other Ambulatory Visit: Payer: Self-pay

## 2021-08-06 ENCOUNTER — Encounter: Payer: Self-pay | Admitting: Pharmacist

## 2021-08-06 ENCOUNTER — Ambulatory Visit: Payer: Medicare Other | Attending: Internal Medicine | Admitting: Pharmacist

## 2021-08-06 VITALS — BP 138/78 | HR 95 | Temp 98.6°F | Ht 64.0 in | Wt 280.4 lb

## 2021-08-06 DIAGNOSIS — Z23 Encounter for immunization: Secondary | ICD-10-CM

## 2021-08-06 DIAGNOSIS — Z0000 Encounter for general adult medical examination without abnormal findings: Secondary | ICD-10-CM | POA: Diagnosis not present

## 2021-08-06 NOTE — Progress Notes (Signed)
Subjective:   Sarah Randall is a 55 y.o. female who presents for an Initial Medicare Annual Wellness Visit.  Cardiac Risk Factors include: dyslipidemia;advanced age (>58mn, >>27women);hypertension;obesity (BMI >30kg/m2);sedentary lifestyle;smoking/ tobacco exposure    Objective:    Today's Vitals   08/06/21 1532 08/06/21 1533  BP: 138/78   Pulse: 95   Temp: 98.6 F (37 C)   SpO2: 97%   Weight: 280 lb 6.4 oz (127.2 kg)   Height: '5\' 4"'$  (1.626 m)   PainSc: 5  5    Body mass index is 48.13 kg/m.  Advanced Directives 08/06/2021 05/08/2021 05/06/2021 01/23/2021 01/17/2021 12/13/2020 09/06/2020  Does Patient Have a Medical Advance Directive? No No No No No No No  Would patient like information on creating a medical advance directive? No - Patient declined No - Patient declined No - Patient declined No - Patient declined No - Patient declined - No - Patient declined    Current Medications (verified) Outpatient Encounter Medications as of 08/06/2021  Medication Sig   Cyanocobalamin (B-12) 5000 MCG CAPS Take 5,000 mcg by mouth every other day.   esomeprazole (NEXIUM) 40 MG capsule TAKE 1 CAPSULE BY MOUTH EVERY DAY   hydrochlorothiazide (HYDRODIURIL) 25 MG tablet TAKE 1 TABLET BY MOUTH EVERY DAY (Patient taking differently: Take 25 mg by mouth daily.)   losartan (COZAAR) 50 MG tablet Take 1 tablet (50 mg total) by mouth daily.   propranolol (INDERAL) 10 MG tablet TAKE 1 TABLET BY MOUTH TWICE A DAY (Patient taking differently: Take 10 mg by mouth daily.)   nitroGLYCERIN (NITROSTAT) 0.4 MG SL tablet Place 1 tablet (0.4 mg total) under the tongue every 5 (five) minutes as needed.   [DISCONTINUED] docusate sodium (COLACE) 100 MG capsule Take 1 capsule (100 mg total) by mouth 2 (two) times daily.   [DISCONTINUED] methocarbamol (ROBAXIN) 500 MG tablet Take 2 tablets (1,000 mg total) by mouth every 6 (six) hours as needed for muscle spasms.   [DISCONTINUED] oxyCODONE-acetaminophen (PERCOCET/ROXICET)  5-325 MG tablet Take 1-2 tablets by mouth every 6 (six) hours as needed for moderate pain or severe pain.   No facility-administered encounter medications on file as of 08/06/2021.    Allergies (verified) Pregabalin, Cymbalta [duloxetine hcl], Gabapentin, Lipitor [atorvastatin], Pravachol [pravastatin], and Zoloft [sertraline hcl]   History: Past Medical History:  Diagnosis Date   Anemia    Anxiety    Arthritis    B12 deficiency 06/30/2019   Depression    GERD (gastroesophageal reflux disease)    occasionally takes OTC   History of leukocytosis    Hyperlipidemia    Hypertension    Low back pain    Obesity    Pain in right buttock    Goes down to Right leg to lateral aspect of calf.   Pre-diabetes    Sleep apnea    tested 2014 - unable to tolerate machine   Spondylolisthesis at L4-L5 level    Past Surgical History:  Procedure Laterality Date   ABDOMINAL EXPOSURE N/A 01/22/2021   Procedure: LATERAL EXPOSURE FOR OBLIQUE LATERAL INTERBODY FUSION;  Surgeon: CMarty Heck MD;  [facility];  Service: Vascular;  Laterality: N/A;   ABDOMINAL HYSTERECTOMY     ANTERIOR LUMBAR FUSION N/A 01/22/2021   Procedure: Oblique Lateral Interbody Fusion Lumbar Four-Lumbar Five;  Surgeon: TVallarie Mare MD;  [place]  Service: Neurosurgery;  Laterality: N/A;   LEFT HEART CATH AND CORONARY ANGIOGRAPHY N/A 10/25/2018   Procedure: LEFT HEART CATH AND CORONARY  ANGIOGRAPHY;  Surgeon: Martinique, Peter M, MD;  Location: [place] [facility];  Service: Cardiovascular;  Laterality: N/A;   LUMBAR PERCUTANEOUS PEDICLE SCREW 1 LEVEL N/A 01/22/2021   Procedure: LUMBAR PERCUTANEOUS PEDICLE SCREW LUBAR FOUR- FIVE;  Surgeon: Vallarie Mare, MD;  Location: [place];  Service: Neurosurgery;  Laterality: N/A;   ROBOTIC ASSISTED LAPAROSCOPIC HYSTERECTOMY AND SALPINGECTOMY Bilateral 12/27/2019   Procedure: XI ROBOTIC ASSISTED TOTAL LAPAROSCOPIC HYSTERECTOMY AND SALPINGECTOMY;  Surgeon: Princess Bruins, MD;  Location: [place];  Service: Gynecology;  Laterality: Bilateral;   TOTAL HIP ARTHROPLASTY Left 09/22/2017   Procedure: LEFT TOTAL HIP ARTHROPLASTY ANTERIOR APPROACH;  Surgeon: Leandrew Koyanagi, MD;  Location: [facility];  Service: Orthopedics;  Laterality: Left;   TOTAL KNEE ARTHROPLASTY Right 01/12/2018   Procedure: RIGHT TOTAL KNEE ARTHROPLASTY;  Surgeon: Leandrew Koyanagi, MD;  Location: [place];  Service: Orthopedics;  Laterality: Right;   TUBAL LIGATION     Family History  Problem Relation Age of Onset   Hypertension Mother    Diabetes Mother    Breast cancer Mother 88   Hypertension Father    Lung cancer Maternal Aunt    Colon polyps Brother    Colon cancer Neg Hx    Esophageal cancer Neg Hx    Rectal cancer Neg Hx    Stomach cancer Neg Hx    Social History   Socioeconomic History   Marital status: Single    Spouse name: Not on file   Number of children: 2   Years of education: 9   Highest education level: Not on file  Occupational History   Occupation: unemployed  Tobacco Use   Smoking status: Every Day    Packs/day: 0.25    Years: 30.00    Pack years: 7.50    Types: Cigarettes   Smokeless tobacco: Never  Vaping Use   Vaping Use: Never used  Substance and Sexual Activity   Alcohol use: Yes    Alcohol/week: 4.0 - 6.0 standard drinks    Types: 4 - 6 Shots of liquor per week    Comment: occ   Drug use: Yes    Frequency: 2.0 times per week    Types: Marijuana   Sexual activity: Yes    Birth control/protection: None    Comment: declined insurance questions,des neg  Other Topics Concern   Not on file  Social History Narrative   Not on file   Social Determinants of Health   Financial Resource Strain: Not on file  Food Insecurity: Not on file  Transportation Needs: Not on file  Physical Activity: Not on file  Stress: Not on file  Social Connections: Not on file    Tobacco Counseling Ready to quit: No Counseling given:  Yes   Clinical Intake:  Pre-visit preparation completed: No  Pain : 0-10 Pain Score: 5  Pain Type: Chronic pain Pain Location: Hip Pain Descriptors / Indicators: Aching Pain Onset: More than a month ago Pain Frequency: Intermittent Pain Relieving Factors: prn ibuprofen Effect of Pain on Daily Activities: limits her exercise tolerance. Still able to perform ADLs independently  Pain Relieving Factors: prn ibuprofen  BMI - recorded: 48.13 Nutritional Status: BMI > 30  Obese Nutritional Risks: None Diabetes: No  How often do you need to have someone help you when you read instructions, pamphlets, or other written materials from your doctor or pharmacy?: 2 - Rarely  Diabetic? No  Interpreter Needed?: No  Information entered by :: [place]   Activities  of Daily Living In your present state of health, do you have any difficulty performing the following activities: 08/06/2021 05/08/2021  Hearing? N N  Vision? N N  Difficulty concentrating or making decisions? N N  Comment - -  Walking or climbing stairs? N Y  Dressing or bathing? N Y  Doing errands, shopping? N N  Preparing Food and eating ? N -  Using the Toilet? N -  In the past six months, have you accidently leaked urine? N -  Do you have problems with loss of bowel control? N -  Managing your Medications? N -  Managing your Finances? N -  Housekeeping or managing your Housekeeping? N -  Some recent data might be hidden    Patient Care Team: Ladell Pier, MD as PCP - General (Internal Medicine)  Indicate any recent Medical Services you may have received from other than [facility] providers in the past year (date may be approximate).     Assessment:   This is a routine wellness examination for [place].  Hearing/Vision screen No results found.  Dietary issues and exercise activities discussed: Current Exercise Habits: The patient does not participate in regular exercise at present, Exercise limited by: orthopedic  condition(s)   Goals Addressed   None   Depression Screen PHQ 2/9 Scores 08/06/2021 03/18/2021 10/10/2020 09/16/2020 09/07/2019 06/20/2019 11/10/2018  PHQ - 2 Score 0 2 0 4 0 2 3  PHQ- 9 Score - 8 - 10 - 5 12    Fall Risk Fall Risk  08/06/2021 03/18/2021 10/10/2020 09/16/2020 09/07/2019  Falls in the past year? 0 0 0 0 0  Number falls in past yr: 0 0 0 0 -  Injury with Fall? 0 0 0 0 -  Follow up Falls evaluation completed;Education provided;Falls prevention discussed - - - -    FALL RISK PREVENTION PERTAINING TO THE HOME:  Any stairs in or around the home? No  If so, are there any without handrails? No  Home free of loose throw rugs in walkways, pet beds, electrical cords, etc? Yes  Adequate lighting in your home to reduce risk of falls? Yes   ASSISTIVE DEVICES UTILIZED TO PREVENT FALLS:  Life alert? No  Use of a cane, walker or w/c? No  Will use cane occasionally.  Grab bars in the bathroom? No  Shower chair or bench in shower? Yes  Elevated toilet seat or a handicapped toilet? No   TIMED UP AND GO:  Was the test performed? Yes .  Length of time to ambulate 10 feet: 10 sec.   Gait slow and steady without use of assistive device  Cognitive Function: MMSE - Mini Mental State Exam 08/06/2021  Orientation to time 5  Orientation to Place 5  Registration 3  Attention/ Calculation 5  Recall 3  Language- name 2 objects 2  Language- repeat 1  Language- follow 3 step command 3  Language- read & follow direction 1  Write a sentence 1  Copy design 1  Total score 30        Immunizations Immunization History  Administered Date(s) Administered   Influenza,inj,Quad PF,6+ Mos 08/10/2017, 08/06/2019, 09/16/2020   Influenza-Unspecified 08/24/2019   PFIZER(Purple Top)SARS-COV-2 Vaccination 02/18/2020, 03/11/2020   Pneumococcal Polysaccharide-23 01/13/2018   Tdap 09/16/2020    TDAP status: Up to date  Flu Vaccine status: Up to date  Pneumococcal vaccine status: Completed  during today's visit.  Covid-19 vaccine status: Information provided on how to obtain vaccines.   Qualifies for Shingles  Vaccine? Yes   Zostavax completed No   Shingrix Completed?: No.    Education has been provided regarding the importance of this vaccine. Patient has been advised to call insurance company to determine out of pocket expense if they have not yet received this vaccine. Advised may also receive vaccine at local pharmacy or Health Dept. Verbalized acceptance and understanding.  Screening Tests Health Maintenance  Topic Date Due   FOOT EXAM  Never done   OPHTHALMOLOGY EXAM  Never done   Zoster Vaccines- Shingrix (1 of 2) Never done   Pneumococcal Vaccine 53-70 Years old (2 - PCV) 01/13/2019   COVID-19 Vaccine (3 - Booster for Pfizer series) 08/11/2020   MAMMOGRAM  05/28/2021   INFLUENZA VACCINE  07/07/2021   HEMOGLOBIN A1C  09/17/2021   COLONOSCOPY (Pts 45-68yr Insurance coverage will need to be confirmed)  10/18/2029   TETANUS/TDAP  09/16/2030   PNEUMOCOCCAL POLYSACCHARIDE VACCINE AGE 30-64 HIGH RISK  Completed   Hepatitis C Screening  Completed   HIV Screening  Completed   HPV VACCINES  Aged Out    Health Maintenance  Health Maintenance Due  Topic Date Due   FOOT EXAM  Never done   OPHTHALMOLOGY EXAM  Never done   Zoster Vaccines- Shingrix (1 of 2) Never done   Pneumococcal Vaccine 090685Years old (2 - PCV) 01/13/2019   COVID-19 Vaccine (3 - Booster for Pfizer series) 08/11/2020   MAMMOGRAM  05/28/2021   INFLUENZA VACCINE  07/07/2021    Colorectal cancer screening: Type of screening: Colonoscopy. Completed 10/19/2019. Repeat every 10 years  Mammogram status: Ordered today. Pt provided with contact info and advised to call to schedule appt.   Bone density: not eligible   Lung Cancer Screening: (Low Dose CT Chest recommended if Age 334-80years, 30 pack-year currently smoking OR have quit w/in 15years.) does not qualify.   Lung Cancer Screening Referral: Not  indicated  Additional Screening:  Hepatitis C Screening: does qualify; Completed 09/16/2020  Vision Screening: Recommended annual ophthalmology exams for early detection of glaucoma and other disorders of the eye. Is the patient up to date with their annual eye exam?  No  Who is the provider or what is the name of the office in which the patient attends annual eye exams? None  If pt is not established with a provider, would they like to be referred to a provider to establish care? No .   Dental Screening: Recommended annual dental exams for proper oral hygiene  Community Resource Referral / Chronic Care Management: CRR required this visit?  Yes   CCM required this visit?  Yes      Plan:     I have personally reviewed and noted the following in the patient's chart:   Medical and social history Use of alcohol, tobacco or illicit drugs  Current medications and supplements including opioid prescriptions. Patient is not currently taking opioid prescriptions. Functional ability and status Nutritional status Physical activity Advanced directives List of other physicians Hospitalizations, surgeries, and ER visits in previous 12 months Vitals Screenings to include cognitive, depression, and falls Referrals and appointments  In addition, I have reviewed and discussed with patient certain preventive protocols, quality metrics, and best practice recommendations. A written personalized care plan for preventive services as well as general preventive health recommendations were provided to patient.   STresa Endo RPH-CPP   08/06/2021

## 2021-08-07 ENCOUNTER — Other Ambulatory Visit: Payer: Self-pay | Admitting: Internal Medicine

## 2021-08-07 DIAGNOSIS — Z1231 Encounter for screening mammogram for malignant neoplasm of breast: Secondary | ICD-10-CM

## 2021-08-12 ENCOUNTER — Other Ambulatory Visit: Payer: Self-pay

## 2021-08-12 ENCOUNTER — Ambulatory Visit: Payer: Medicare Other | Attending: Internal Medicine | Admitting: Internal Medicine

## 2021-08-12 DIAGNOSIS — F32A Depression, unspecified: Secondary | ICD-10-CM | POA: Diagnosis not present

## 2021-08-12 DIAGNOSIS — H6983 Other specified disorders of Eustachian tube, bilateral: Secondary | ICD-10-CM

## 2021-08-12 DIAGNOSIS — Z6379 Other stressful life events affecting family and household: Secondary | ICD-10-CM

## 2021-08-12 DIAGNOSIS — R0981 Nasal congestion: Secondary | ICD-10-CM

## 2021-08-12 DIAGNOSIS — F419 Anxiety disorder, unspecified: Secondary | ICD-10-CM

## 2021-08-12 DIAGNOSIS — H6993 Unspecified Eustachian tube disorder, bilateral: Secondary | ICD-10-CM

## 2021-08-12 MED ORDER — ESCITALOPRAM OXALATE 10 MG PO TABS: ORAL_TABLET | ORAL | 1 refills | Status: DC

## 2021-08-12 MED ORDER — LORATADINE 10 MG PO TABS
10.0000 mg | ORAL_TABLET | Freq: Every day | ORAL | 0 refills | Status: DC | PRN
Start: 2021-08-12 — End: 2021-08-14

## 2021-08-12 MED ORDER — FLUTICASONE PROPIONATE 50 MCG/ACT NA SUSP
2.0000 | Freq: Every day | NASAL | 0 refills | Status: DC | PRN
Start: 2021-08-12 — End: 2021-09-04

## 2021-08-12 NOTE — Progress Notes (Signed)
Virtual Visit via Video Note  I connected with Sarah Randall on 08/12/21 at  1:30 PM EDT by a video enabled telemedicine application and verified that I am speaking with the correct person using two identifiers.  Location: Patient: home Provider: office   I discussed the limitations of evaluation and management by telemedicine and the availability of in person appointments. The patient expressed understanding and agreed to proceed.  History of Present Illness: Pt with hx of HTN,HL (on statin 2 x a wk -cramps), pre- DM, tob abuse, OSA has CPAP but not using, obesity,GERD, depression, IDA, B12 def (Dr. Burr Medico), obesity, DDD of lumbar spine, OA RT hip and LT knee s/p jt replacements.  Patient was last seen by me 03/2021.  She had Medicare wellness visit with our clinical pharmacist last week.  Patient requested this visit to discuss ongoing feelings of being overwhelmed..  Patient tells me she has feelings of being overwhelmed.  She has her own health issues that she is dealing with including chronic pain in her lower back despite having surgery and in the hips which she attributes to bursitis.  In addition to dealing with her health issues, she is having to run back and forth to her parents house helping to take care of them.  She is also having issues with her grown son.  She feels stressed and depressed.  When she gets really stressed, she has headaches and chest pains.  She denies any suicidal ideation but states "sometimes I feel like I do not want to be here."  She wanted to reach out because she feels she needs some help.  She reports being on medication before for depression but she does not recall the name.  Her other concern is of having nasal congestion and feeling like her ears are clogged up.  She gets popping sounds in the ears.  This has been going on for about a week.  She denies any fever, cough or shortness of breath.  She did a COVID home test a few days ago and it was negative.  She has  been using Mucinex nasal spray but it does not seem to be helping.    Outpatient Encounter Medications as of 08/12/2021  Medication Sig   Cyanocobalamin (B-12) 5000 MCG CAPS Take 5,000 mcg by mouth every other day.   esomeprazole (NEXIUM) 40 MG capsule TAKE 1 CAPSULE BY MOUTH EVERY DAY   hydrochlorothiazide (HYDRODIURIL) 25 MG tablet TAKE 1 TABLET BY MOUTH EVERY DAY (Patient taking differently: Take 25 mg by mouth daily.)   losartan (COZAAR) 50 MG tablet Take 1 tablet (50 mg total) by mouth daily.   nitroGLYCERIN (NITROSTAT) 0.4 MG SL tablet Place 1 tablet (0.4 mg total) under the tongue every 5 (five) minutes as needed.   propranolol (INDERAL) 10 MG tablet TAKE 1 TABLET BY MOUTH TWICE A DAY (Patient taking differently: Take 10 mg by mouth daily.)   No facility-administered encounter medications on file as of 08/12/2021.      Observations/Objective: Patient is a middle-aged African-American female in NAD.  She is able to talk in complete sentences.  Depression screen [facility] 2/9 08/06/2021 03/18/2021 10/10/2020  Decreased Interest 0 1 0  Down, Depressed, Hopeless 0 1 0  PHQ - 2 Score 0 2 0  Altered sleeping - 1 -  Tired, decreased energy - 2 -  Change in appetite - 2 -  Feeling bad or failure about yourself  - 1 -  Trouble concentrating - 0 -  Moving slowly or fidgety/restless - 0 -  Suicidal thoughts - 0 -  PHQ-9 Score - 8 -  Some recent data might be hidden   GAD 7 : Generalized Anxiety Score 03/18/2021 09/16/2020 06/20/2019 11/10/2018  Nervous, Anxious, on Edge '1 3 2 3  '$ Control/stop worrying '1 2 2 1  '$ Worry too much - different things '1 2 2 1  '$ Trouble relaxing '1 2 1 3  '$ Restless 0 '1 1 2  '$ Easily annoyed or irritable '1 1 2 1  '$ Afraid - awful might happen 0 0 2 2  Total GAD 7 Score '5 11 12 13    '$ Assessment and Plan: 1. Stressful life event affecting family 2. Anxiety and depression We discussed management.  Patient is agreeable to referral to behavioral health as she feels she would  benefit from some counseling.  She is also agreeable to trying medication.  Initially I wanted to put her on Cymbalta as I think it would help with some of her chronic pain issues but she did not tolerate Cymbalta in the past.  We will put her on low-dose of Lexapro instead.  She will do 10 mg a half a tablet for the first 2 weeks then increase to the full tablet.  Advised that it would take about 4 weeks before she starts feeling better on the medicine.  However if she develops any worsening depression or anxiety while on the medicine, she should let us know.  Message sent to our LCSW to follow-up with her. - escitalopram (LEXAPRO) 10 MG tablet; 1/2 tab PO daily x 2 wks then 1 tab daily  Dispense: 30 tablet; Refill: 1 - Ambulatory referral to Psychiatry  3. Dysfunction of both eustachian tubes 4. Nasal congestion Advised to stop the Mucinex.  We will place her on Flonase nasal spray and Claritin. - fluticasone (FLONASE) 50 MCG/ACT nasal spray; Place 2 sprays into both nostrils daily as needed for allergies or rhinitis (nasal congestion).  Dispense: 9.9 mL; Refill: 0 - loratadine (CLARITIN) 10 MG tablet; Take 1 tablet (10 mg total) by mouth daily as needed for allergies.  Dispense: 30 tablet; Refill: 0   Follow Up Instructions: 6 weeks to see how she is doing on the Lexapro.   I discussed the assessment and treatment plan with the patient. The patient was provided an opportunity to ask questions and all were answered. The patient agreed with the plan and demonstrated an understanding of the instructions.   The patient was advised to call back or seek an in-person evaluation if the symptoms worsen or if the condition fails to improve as anticipated.  I spent 13 minutes dedicated to the care of this patient on the date of this encounter to include previsit review of record, face-to-face time with the patient discussing her symptoms, diagnosis and management and postvisit entering of  orders.   Karle Plumber, MD

## 2021-08-13 ENCOUNTER — Telehealth: Payer: Self-pay

## 2021-08-13 ENCOUNTER — Other Ambulatory Visit: Payer: Self-pay | Admitting: Internal Medicine

## 2021-08-13 DIAGNOSIS — H6983 Other specified disorders of Eustachian tube, bilateral: Secondary | ICD-10-CM

## 2021-08-13 DIAGNOSIS — R0981 Nasal congestion: Secondary | ICD-10-CM

## 2021-08-13 NOTE — Telephone Encounter (Signed)
Pt has been scheduled and reminder has been mailed.  

## 2021-08-13 NOTE — Telephone Encounter (Signed)
-----   Message from Ladell Pier, MD sent at 08/12/2021  1:56 PM EDT ----- F/u in 6 wks for depression/anxiety.

## 2021-08-14 NOTE — Telephone Encounter (Signed)
Requested medication (s) are due for refill today: signed 08/12/21  Requested medication (s) are on the active medication list: yes   Last refill:  08/12/21 #30 0 refill  Future visit scheduled: yes in 1 month   Notes to clinic:  Pharmacy comment: [place].     Requested Prescriptions  Pending Prescriptions Disp Refills   loratadine (CLARITIN) 10 MG tablet [Pharmacy Med Name: LORATADINE 10 MG TABLET] 90 tablet 1    Sig: TAKE 1 TABLET BY MOUTH EVERY DAY AS NEEDED FOR ALLERGY     Ear, Nose, and Throat:  Antihistamines Passed - 08/13/2021 12:27 PM      Passed - Valid encounter within last 12 months    Recent Outpatient Visits           2 days ago Stressful life event affecting family   [place] Ladell Pier, MD   4 months ago Essential hypertension   [place], MD   11 months ago Need for influenza vaccination   [place], RPH-CPP   11 months ago Essential hypertension   [place], Deborah B, MD   1 year ago Rhinovirus   [place], [place], NP       Future Appointments             In 1 month Wynetta Emery Dalbert Batman, MD [place]   In 3 months Hilty, Nadean Corwin, MD [facility]

## 2021-08-15 ENCOUNTER — Telehealth: Payer: Self-pay | Admitting: Clinical

## 2021-08-15 NOTE — Telephone Encounter (Signed)
Spoke with pt and scheduled appt for 09/08/21 at 3:00 per PCP concerns with stress.

## 2021-08-23 ENCOUNTER — Other Ambulatory Visit: Payer: Self-pay | Admitting: Internal Medicine

## 2021-08-23 DIAGNOSIS — I10 Essential (primary) hypertension: Secondary | ICD-10-CM

## 2021-08-24 NOTE — Telephone Encounter (Signed)
Requested medication (s) are due for refill today: yes  Requested medication (s) are on the active medication list: yes  Last refill:  06/19/21 #30  Future visit scheduled: yes  Notes to clinic:  courtesy RF previous refill   Requested Prescriptions  Pending Prescriptions Disp Refills   losartan (COZAAR) 50 MG tablet [Pharmacy Med Name: LOSARTAN POTASSIUM 50 MG TAB] 30 tablet 1    Sig: TAKE 1 TABLET BY MOUTH EVERY DAY     Cardiovascular:  Angiotensin Receptor Blockers Failed - 08/23/2021  2:30 PM      Failed - K in normal range and within 180 days    Potassium  Date Value Ref Range Status  05/06/2021 3.4 (L) 3.5 - 5.1 mmol/L Final          Passed - Cr in normal range and within 180 days    Creatinine  Date Value Ref Range Status  06/15/2019 0.94 0.44 - 1.00 mg/dL Final  08/10/2017 150.1 20.0 - 300.0 mg/dL Final   Creatinine, Ser  Date Value Ref Range Status  05/06/2021 0.81 0.44 - 1.00 mg/dL Final          Passed - Patient is not pregnant      Passed - Last BP in normal range    BP Readings from Last 1 Encounters:  08/06/21 138/78          Passed - Valid encounter within last 6 months    Recent Outpatient Visits           1 week ago Stressful life event affecting family   [place], Deborah B, MD   5 months ago Essential hypertension   [place], MD   11 months ago Need for influenza vaccination   [place], Jarome [place], RPH-CPP   11 months ago Essential hypertension   [place], Deborah B, MD   1 year ago Rhinovirus   [place], [place], NP       Future Appointments             In 4 weeks Ladell Pier, MD [place]   In 2 months Hilty, Nadean Corwin, MD [facility]

## 2021-09-04 ENCOUNTER — Other Ambulatory Visit: Payer: Self-pay | Admitting: Internal Medicine

## 2021-09-04 DIAGNOSIS — R0981 Nasal congestion: Secondary | ICD-10-CM

## 2021-09-04 DIAGNOSIS — F32A Depression, unspecified: Secondary | ICD-10-CM

## 2021-09-04 DIAGNOSIS — F419 Anxiety disorder, unspecified: Secondary | ICD-10-CM

## 2021-09-04 NOTE — Telephone Encounter (Signed)
Requested medication (s) are due for refill today: Yes  Requested medication (s) are on the active medication list: Yes  Last refill:  08/13/19  Future visit scheduled: Yes  Notes to clinic:  Diagnosis code needed, request for 90 day     Requested Prescriptions  Pending Prescriptions Disp Refills   escitalopram (LEXAPRO) 10 MG tablet [Pharmacy Med Name: ESCITALOPRAM 10 MG TABLET] 90 tablet 1    Sig: 1/2 TAB BY MOUTH DAILY X 2 WKS THEN 1 TAB DAILY     Psychiatry:  Antidepressants - SSRI Passed - 09/04/2021 12:30 PM      Passed - Completed PHQ-2 or PHQ-9 in the last 360 days      Passed - Valid encounter within last 6 months    Recent Outpatient Visits           3 weeks ago Stressful life event affecting family   [place], MD   5 months ago Essential hypertension   [place], MD   11 months ago Need for influenza vaccination   [place], RPH-CPP   11 months ago Essential hypertension   [place], Deborah B, MD   1 year ago Rhinovirus   [place], [place], NP       Future Appointments             In 2 weeks Ladell Pier, MD [place]   In 2 months Hilty, Nadean Corwin, MD [facility]

## 2021-09-08 ENCOUNTER — Institutional Professional Consult (permissible substitution): Payer: Medicare Other | Admitting: Clinical

## 2021-09-18 ENCOUNTER — Other Ambulatory Visit: Payer: Self-pay

## 2021-09-18 ENCOUNTER — Ambulatory Visit
Admission: RE | Admit: 2021-09-18 | Discharge: 2021-09-18 | Disposition: A | Discharge status: Home / Self Care | Payer: Medicare Other | Source: Ambulatory Visit | Attending: Internal Medicine | Admitting: Internal Medicine

## 2021-09-18 DIAGNOSIS — Z1231 Encounter for screening mammogram for malignant neoplasm of breast: Secondary | ICD-10-CM

## 2021-09-18 IMAGING — MG MM DIGITAL SCREENING BILAT W/ TOMO AND CAD
6 of 12 series · 6 of 36 positions shown · non-contrast
Comparison: Previous exam(s).

CLINICAL DATA: Screening.

EXAM:
DIGITAL SCREENING BILATERAL MAMMOGRAM WITH TOMOSYNTHESIS AND CAD
TECHNIQUE: Bilateral screening digital craniocaudal and mediolateral oblique
mammograms were obtained. Bilateral screening digital breast
tomosynthesis was performed. The images were evaluated with
computer-aided detection.

[L CC synth-2D]
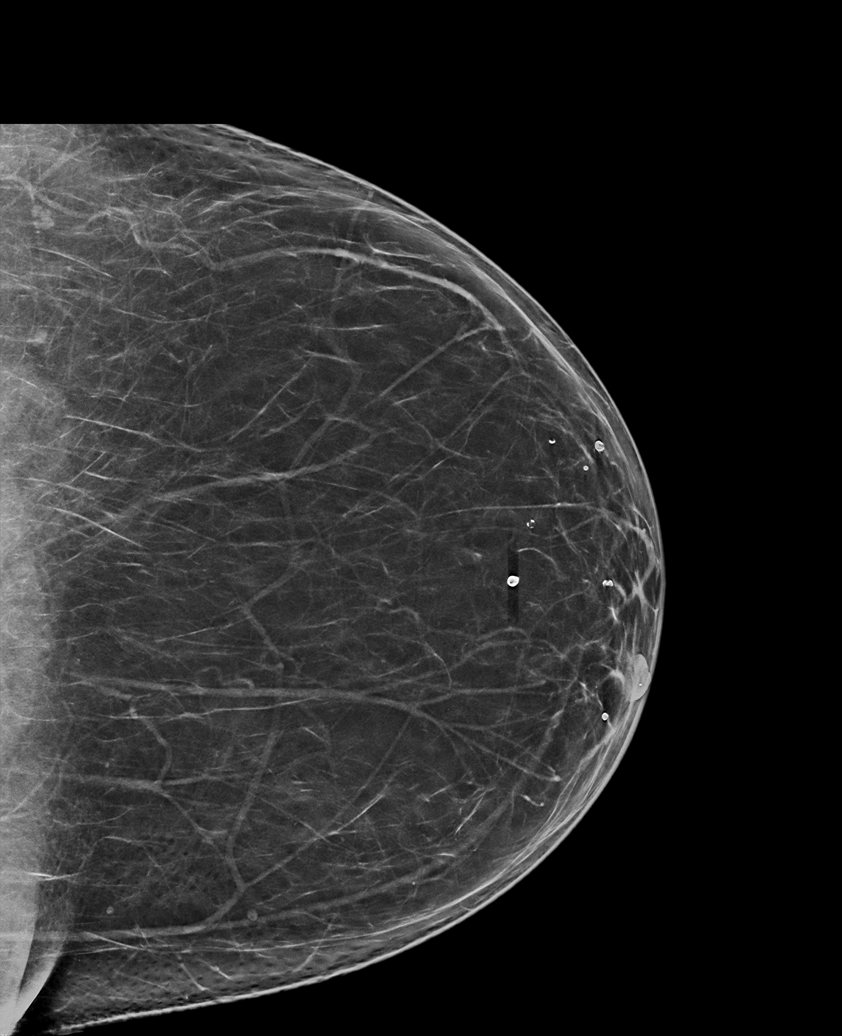

[R MLO synth-2D (1 of 2)]
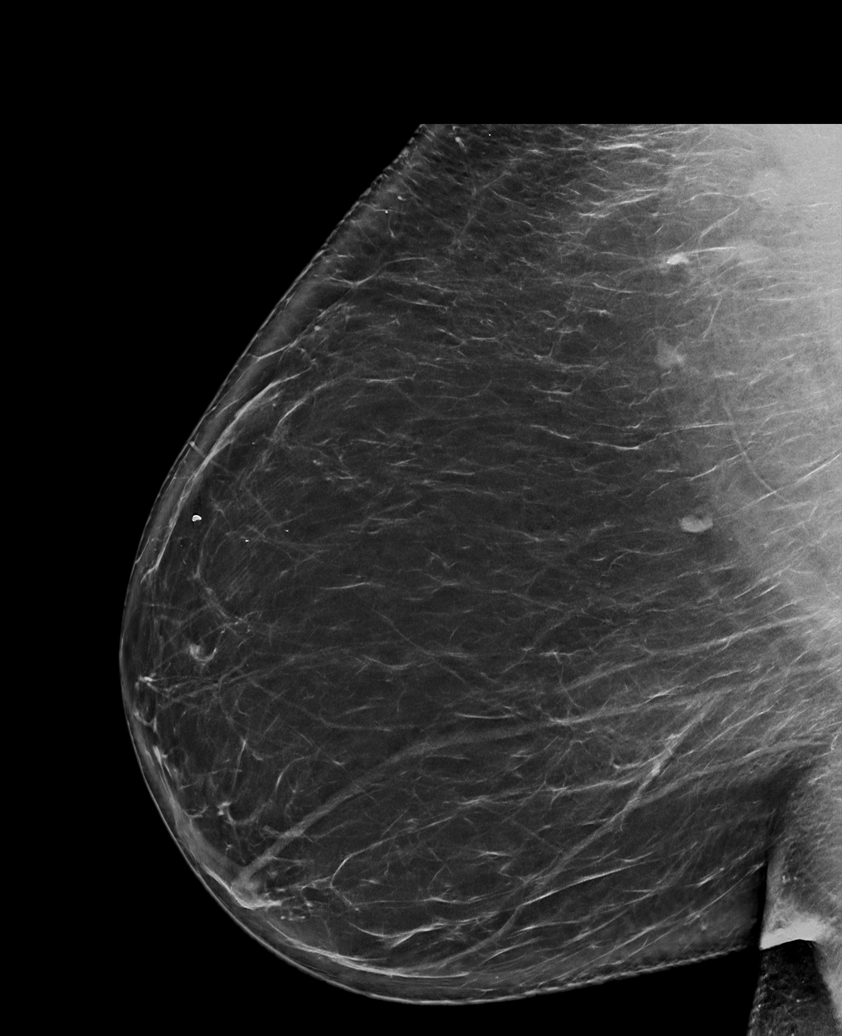

[R MLO synth-2D (2 of 2)]
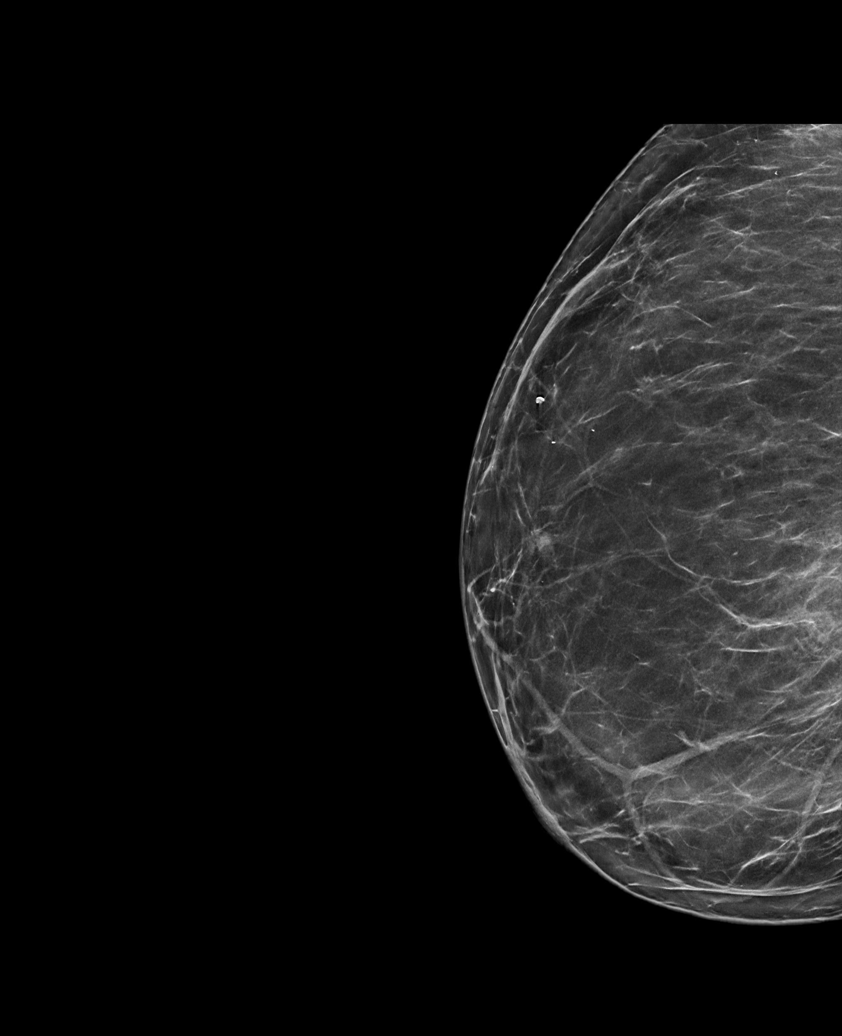

[R CV synth-2D]
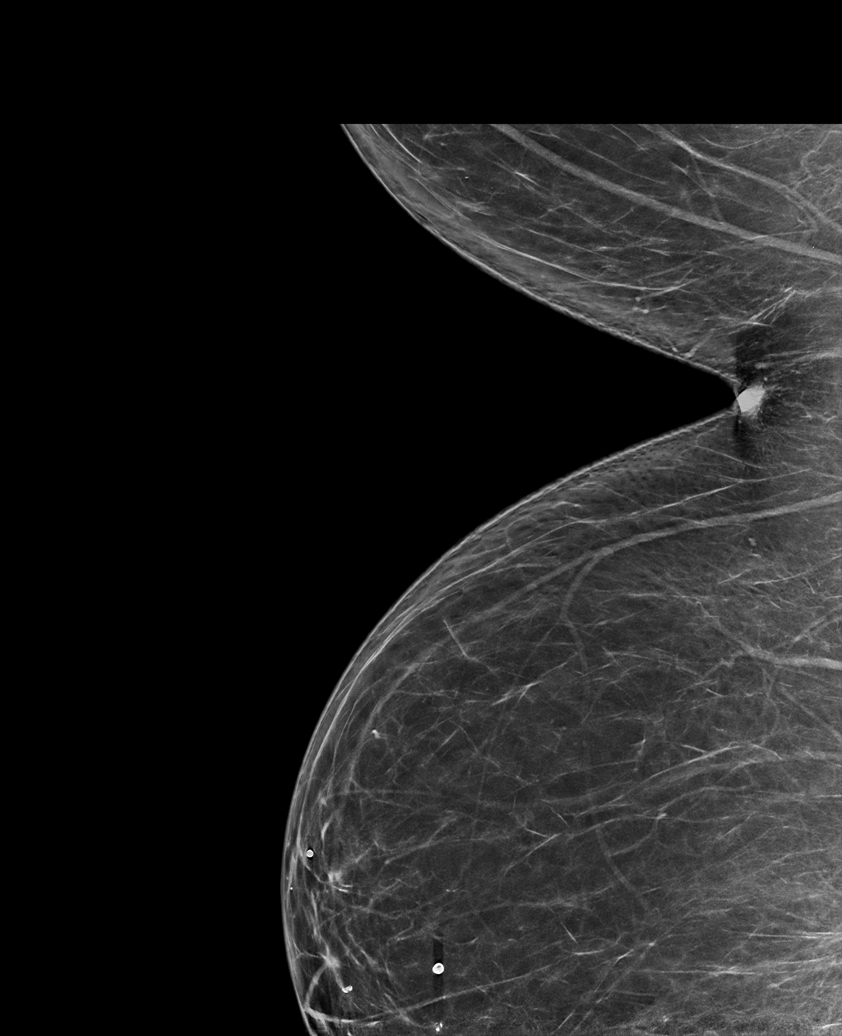

[L MLO synth-2D]
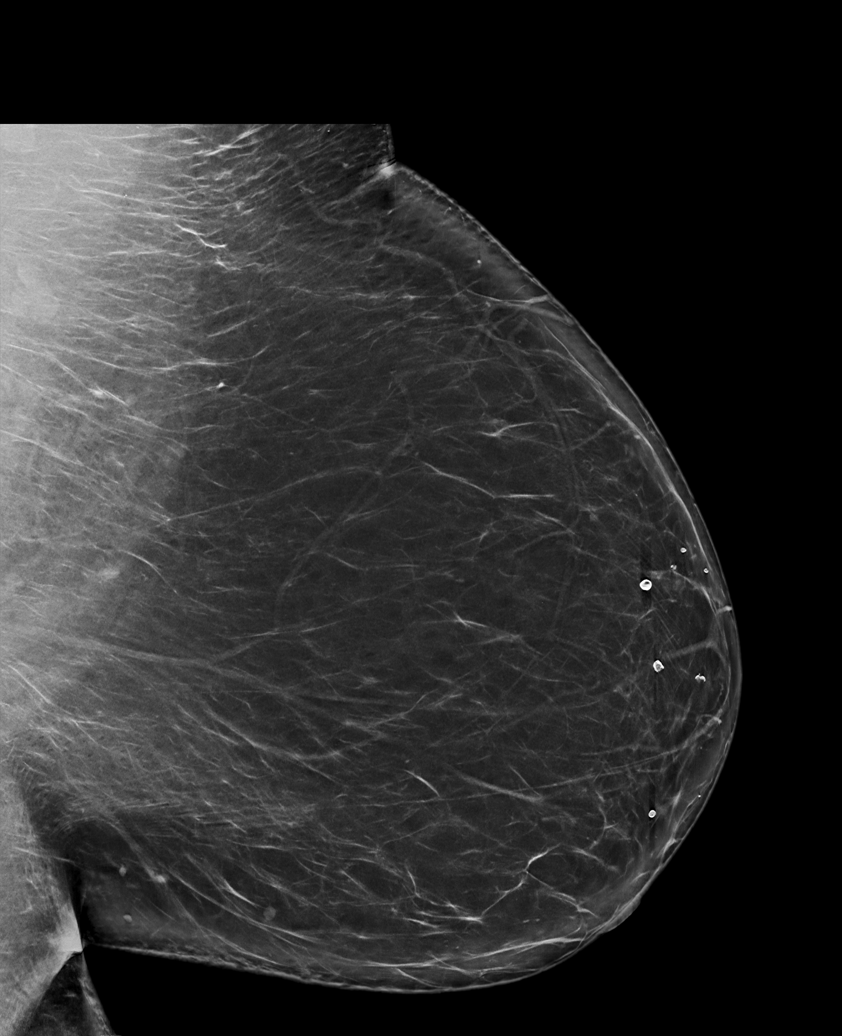

[R CC synth-2D]
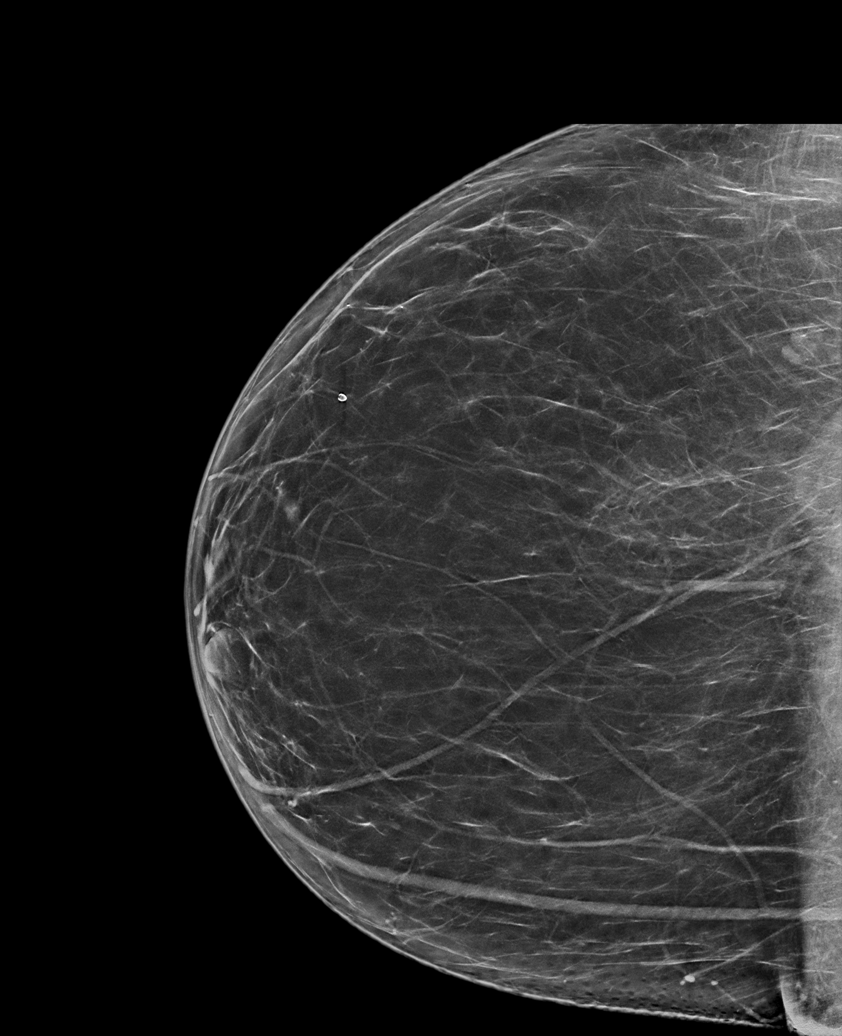

[6 of 36 positions shown; findings below may reference images not displayed]

ACR Breast Density Category b: There are scattered areas of
fibroglandular density.
FINDINGS: There are no findings suspicious for malignancy.
IMPRESSION: No mammographic evidence of malignancy. A result letter of this
screening mammogram will be mailed directly to the patient.

RECOMMENDATION:
Screening mammogram in one year. (Code:[BY])

BI-RADS CATEGORY  1: Negative.

## 2021-09-20 ENCOUNTER — Other Ambulatory Visit: Payer: Self-pay | Admitting: Internal Medicine

## 2021-09-20 DIAGNOSIS — K219 Gastro-esophageal reflux disease without esophagitis: Secondary | ICD-10-CM

## 2021-09-20 DIAGNOSIS — G43011 Migraine without aura, intractable, with status migrainosus: Secondary | ICD-10-CM

## 2021-09-20 DIAGNOSIS — G250 Essential tremor: Secondary | ICD-10-CM

## 2021-09-20 DIAGNOSIS — I10 Essential (primary) hypertension: Secondary | ICD-10-CM

## 2021-09-20 NOTE — Telephone Encounter (Signed)
pt has enough med from previous refill to last until appt

## 2021-09-22 ENCOUNTER — Other Ambulatory Visit: Payer: Self-pay

## 2021-09-22 ENCOUNTER — Ambulatory Visit: Payer: Medicare Other | Attending: Internal Medicine | Admitting: Internal Medicine

## 2021-09-24 ENCOUNTER — Ambulatory Visit: Payer: Medicare Other | Attending: Family Medicine | Admitting: Clinical

## 2021-09-24 ENCOUNTER — Other Ambulatory Visit: Payer: Self-pay

## 2021-09-24 ENCOUNTER — Encounter: Payer: Self-pay | Admitting: Internal Medicine

## 2021-09-24 DIAGNOSIS — F331 Major depressive disorder, recurrent, moderate: Secondary | ICD-10-CM | POA: Diagnosis not present

## 2021-09-24 MED ORDER — LOSARTAN POTASSIUM 50 MG PO TABS
50.0000 mg | ORAL_TABLET | Freq: Every day | ORAL | 1 refills | Status: DC
  Filled 2021-09-24: qty 30, 30d supply, fill #0

## 2021-09-25 ENCOUNTER — Telehealth: Payer: Self-pay | Admitting: Internal Medicine

## 2021-09-25 DIAGNOSIS — I10 Essential (primary) hypertension: Secondary | ICD-10-CM

## 2021-09-25 MED ORDER — LOSARTAN POTASSIUM 50 MG PO TABS: 50.0000 mg | ORAL_TABLET | Freq: Every day | ORAL | 0 refills | Status: DC

## 2021-09-25 NOTE — Telephone Encounter (Signed)
[employer] called to request a 90 day supply instead of 30 because the patient receives this at the same cost.  [id number] [name], [place]  [place] [address]  Phone: [phone] Fax: [phone]  losartan (COZAAR) 50 MG tablet

## 2021-09-25 NOTE — Telephone Encounter (Signed)
Done

## 2021-09-26 ENCOUNTER — Other Ambulatory Visit: Payer: Self-pay | Admitting: Internal Medicine

## 2021-09-26 DIAGNOSIS — R0981 Nasal congestion: Secondary | ICD-10-CM

## 2021-09-26 NOTE — Telephone Encounter (Signed)
Requested Prescriptions  Pending Prescriptions Disp Refills  . fluticasone (FLONASE) 50 MCG/ACT nasal spray [Pharmacy Med Name: FLUTICASONE PROP 50 MCG SPRAY] 16 mL 1    Sig: PLACE 2 SPRAYS INTO BOTH NOSTRILS DAILY AS NEEDED FOR ALLERGIES OR RHINITIS (NASAL CONGESTION).     Ear, Nose, and Throat: Nasal Preparations - Corticosteroids Passed - 09/26/2021  9:35 AM      Passed - Valid encounter within last 12 months    Recent Outpatient Visits          1 month ago Stressful life event affecting family   [place], MD   6 months ago Essential hypertension   [place], Deborah B, MD   1 year ago Need for influenza vaccination   [place], RPH-CPP   1 year ago Essential hypertension   [place], Deborah B, MD   1 year ago Rhinovirus   [place], Amy J, NP      Future Appointments            In 1 month Hilty, Nadean Corwin, MD [facility]

## 2021-09-30 ENCOUNTER — Other Ambulatory Visit: Payer: Self-pay

## 2021-10-03 NOTE — BH Specialist Note (Signed)
Integrated Behavioral Health Initial In-Person Visit  MRN: [id number] Name: Sarah Randall  Number of [place] Clinician visits:: 1/6 Session Start time: 2:30pm  Session End time: 3:30pm Total time: 60 minutes  Types of Service: Individual psychotherapy  Interpretor:No. Interpretor Name and Language: N/A   Warm Hand Off Completed.        Subjective: Sarah Randall is a 55 y.o. female accompanied by  self Patient was referred by PCP Karle Plumber, MD for stress. Patient reports the following symptoms/concerns: Reports feeling depressed at times, trouble sleeping, decreased energy, self-esteem disturbances, trouble concentrating, anxiousness, worrying, trouble relaxing, irritability, and restlessness. Reports that she is overwhelmed with having to care for her mother and also having her own physical health problems. Reports that she's had several surgeries over the past few years and is still recovering. Reports that she has siblings but they do not help with her mother. Reports that she is also stressed due to having a strained relationship with her son.  Duration of problem: 2 years; Severity of problem: moderate  Objective: Mood: Anxious and Depressed and Affect: Appropriate and Tearful Risk of harm to self or others: Suicidal ideation  Life Context: Family and Social: Pt has two children. School/Work: Pt cares for her mother.  Self-Care: Pt involved in medication management with PCP.  Life Changes: Pt has had several surgeries over the past few years and is still recovering. Reports that she is overwhelmed with caring for her mother and does not receive support from her siblings.  Patient and/or Family's Strengths/Protective Factors: Sense of purpose and Resilience   Goals Addressed: Patient will: Reduce symptoms of: anxiety and depression Increase knowledge and/or ability of: coping skills and stress reduction  Demonstrate ability to: Increase healthy  adjustment to current life circumstances  Progress towards Goals: Ongoing  Interventions: Interventions utilized: Mindfulness or Psychologist, educational, CBT Cognitive Behavioral Therapy, Supportive Counseling, and Psychoeducation and/or Health Education  Standardized Assessments completed: C-SSRS Short, GAD-7, and PHQ 9 Depression screen [facility] 2/9 09/24/2021 08/06/2021 03/18/2021 10/10/2020 09/16/2020  Decreased Interest 1 0 1 0 2  Down, Depressed, Hopeless 1 0 1 0 2  PHQ - 2 Score 2 0 2 0 4  Altered sleeping 1 - 1 - 1  Tired, decreased energy 2 - 2 - 1  Change in appetite 0 - 2 - 1  Feeling bad or failure about yourself  1 - 1 - 1  Trouble concentrating 1 - 0 - 1  Moving slowly or fidgety/restless 2 - 0 - 0  Suicidal thoughts 1 - 0 - 1  PHQ-9 Score 10 - 8 - 10  Some recent data might be hidden    GAD 7 : Generalized Anxiety Score 09/24/2021 03/18/2021 09/16/2020 06/20/2019  Nervous, Anxious, on Edge 1 1 3 2   Control/stop worrying 1 1 2 2   Worry too much - different things 3 1 2 2   Trouble relaxing 1 1 2 1   Restless 2 0 1 1  Easily annoyed or irritable 1 1 1 2   Afraid - awful might happen 1 0 0 2  Total GAD 7 Score 10 5 11 12      [place] from 09/24/2021 in [place] Admission (Discharged) from 05/08/2021 in [place] [facility] from 05/06/2021 in [facility] PREADMISSION TESTING  C-SSRS RISK CATEGORY Low Risk No Risk No Risk       Patient and/or Family  Response: Pt receptive to tx. Pt receptive to psycheoducation provided on depression and anxiety. Pt receptive to cognitive restructuring and assisted with cognitive processing skills. Pt receptive to establishing healthy boundaries with her siblings, utilizing deep breathing exercises, and incorporating daily self-care.   Patient Centered Plan: Patient is on the following Treatment Plan(s):  Depression and  anxiety  Assessment: Endorses passive SI without plan/intent. Endorses protective factors as children. Verbal safety plan completed and pt provided with crisis resources. Denies HI. Denies auditory/visual hallucinations. Patient currently experiencing depression and anxiety related to physical health and having to care for her mother. Pt appears to not have boundaries with siblings and enables there behaviors. Pt appears to have difficulty with cognitive processing skills and putting herself first.    Patient may benefit from brief therapy and adhering to medication. LCSWA provided psycheducation on depression and anxiety. LCSWA utilized cognitive restructuring and assisted with cognitive processing skills. LCSWA encouraged pt to establish healthy boundaries with siblings, utilize deep breathing exercises, and incorporate daily self-care. LCSWA will fu with pt.  Plan: Follow up with behavioral health clinician on : 10/15/21 Behavioral recommendations: Utilize deep breathing exercises, establish healthy boundaries with siblings, incorporate daily self-care, and adhere to medication. Utilize provided crisis resources if SI arises with plan, means, and intent.  Referral(s): [place] (In Clinic) "From scale of 1-10, how likely are you to follow plan?": 10  Bernestine Holsapple C Karrina Lye, LCSW

## 2021-10-06 ENCOUNTER — Other Ambulatory Visit: Payer: Self-pay | Admitting: Internal Medicine

## 2021-10-06 DIAGNOSIS — G43011 Migraine without aura, intractable, with status migrainosus: Secondary | ICD-10-CM

## 2021-10-06 DIAGNOSIS — I10 Essential (primary) hypertension: Secondary | ICD-10-CM

## 2021-10-06 DIAGNOSIS — G250 Essential tremor: Secondary | ICD-10-CM

## 2021-10-15 ENCOUNTER — Other Ambulatory Visit: Payer: Self-pay

## 2021-10-15 ENCOUNTER — Ambulatory Visit: Payer: Medicare Other | Attending: Internal Medicine | Admitting: Clinical

## 2021-10-15 DIAGNOSIS — F331 Major depressive disorder, recurrent, moderate: Secondary | ICD-10-CM

## 2021-10-20 NOTE — BH Specialist Note (Signed)
Integrated Behavioral Health Follow Up In-Person Visit  MRN: [id number] Name: Sarah Randall  Number of [place] Clinician visits: 2/6 Session Start time: 2:00pm  Session End time: 2:50pm Total time: 50  minutes  Types of Service: Individual psychotherapy  Interpretor:No. Interpretor Name and Language: N/A  Subjective: Sarah Randall is a 55 y.o. female accompanied by  self Patient was referred by PCP Karle Plumber, MD for stress. Patient reports the following symptoms/concerns: Reports feeling depressed, decreased interest in activities, decreased energy, trouble sleeping, self-esteem disturbances, anxiousness, worrying, and irritability. Reports that she continues to be overwhelmed with caring for her mother. Reports that she experiences stress with her family and continues to have a strained relationship with her son. Duration of problem: 2 years; Severity of problem: moderate  Objective: Mood: Anxious and Depressed and Affect: Appropriate and Tearful Risk of harm to self or others: Suicidal ideation Denies plan/intent.  Life Context: Family and Social: Pt has two children. Pt has strained relationship with her son.  School/Work: Pt cares for her mother.  Self-Care: Pt involved in medication management with PCP.  Life Changes: Pt has had several surgeries over the past few years and is still recovering. Reports that she is overwhelmed with caring for her mother and does not receive support from her siblings.  Patient and/or Family's Strengths/Protective Factors: Sense of purpose and Resillience  Goals Addressed: Patient will:  Reduce symptoms of: anxiety and depression   Increase knowledge and/or ability of: coping skills and stress reduction   Demonstrate ability to: Increase healthy adjustment to current life circumstances  Progress towards Goals: Ongoing  Interventions: Interventions utilized:  Mindfulness or Psychologist, educational, CBT Cognitive  Behavioral Therapy, Supportive Counseling, and Psychoeducation and/or Health Education Standardized Assessments completed: GAD-7 and PHQ 9 GAD 7 : Generalized Anxiety Score 10/15/2021 09/24/2021 03/18/2021 09/16/2020  Nervous, Anxious, on Edge 1 1 1 3   Control/stop worrying 1 1 1 2   Worry too much - different things 1 3 1 2   Trouble relaxing 1 1 1 2   Restless 2 2 0 1  Easily annoyed or irritable 2 1 1 1   Afraid - awful might happen 1 1 0 0  Total GAD 7 Score 9 10 5 11      Depression screen [facility] 2/9 10/15/2021 09/24/2021 08/06/2021 03/18/2021 10/10/2020  Decreased Interest 1 1 0 1 0  Down, Depressed, Hopeless 1 1 0 1 0  PHQ - 2 Score 2 2 0 2 0  Altered sleeping 1 1 - 1 -  Tired, decreased energy 2 2 - 2 -  Change in appetite 0 0 - 2 -  Feeling bad or failure about yourself  1 1 - 1 -  Trouble concentrating 1 1 - 0 -  Moving slowly or fidgety/restless 0 2 - 0 -  Suicidal thoughts 1 1 - 0 -  PHQ-9 Score 8 10 - 8 -  Some recent data might be hidden    Patient and/or Family Response: Pt receptive to tx. Pt receptive to psychoeducation provided on depression and anxiety. Pt receptive to assistance with establishing healthy boundaries with family. Pt receptive to cognitive restructuring utilized to decrease negative thoughts and assistance with cognitive processing. Pt will utilize deep breathing exercises and daily self-care.  Patient Centered Plan: Patient is on the following Treatment Plan(s): Depression and anxiety  Assessment: Endorses passive SI without plan/intent. Endorses protective factors as children. Verbal safety plan completed pt provided with crisis resources. Denies HI. Denies auditory/visual hallucinations. Patient currently experiencing  depression and anxiety related to physical health and family problems. Pt has difficulty with cognitive processing skills related to her family problems. Pt appears to have difficulty with self-awareness in her familial roles.   Patient may  benefit from continuing to adhere to medication. LCSWA provided psychoeducation on depression and anxiety. LCSWA provided assistance with establishing healthy boundaries with family. LCSWA utilized cognitive restructuring to decrease negative thoughts and assist with cognitive processing skills. LCSWA encouraged pt to utilize deep breathing exercises and daily self-care. LCSWA will fu with pt.  Plan: Follow up with behavioral health clinician on : 11/03/21 Behavioral recommendations: Utilize deep breathing exercises, daily self-care, and establish healthy boundaries with family. Utilize provided crisis resources if SI arises with plan, means, and intent.  Referral(s): [place] (In Clinic) "From scale of 1-10, how likely are you to follow plan?": 10  Kirti Carl C Makai Dumond, LCSW

## 2021-10-27 DIAGNOSIS — M4316 Spondylolisthesis, lumbar region: Secondary | ICD-10-CM | POA: Diagnosis not present

## 2021-10-27 DIAGNOSIS — I10 Essential (primary) hypertension: Secondary | ICD-10-CM | POA: Diagnosis not present

## 2021-10-29 ENCOUNTER — Other Ambulatory Visit: Payer: Self-pay | Admitting: Internal Medicine

## 2021-10-29 DIAGNOSIS — R0981 Nasal congestion: Secondary | ICD-10-CM

## 2021-10-29 NOTE — Telephone Encounter (Signed)
Call to pharmacy- Rx has a RF. Requested Prescriptions  Pending Prescriptions Disp Refills  . fluticasone (FLONASE) 50 MCG/ACT nasal spray [Pharmacy Med Name: FLUTICASONE PROP 50 MCG SPRAY] 16 mL 1    Sig: PLACE 2 SPRAYS INTO BOTH NOSTRILS DAILY AS NEEDED FOR ALLERGIES OR RHINITIS (NASAL CONGESTION).     Ear, Nose, and Throat: Nasal Preparations - Corticosteroids Passed - 10/29/2021  9:35 AM      Passed - Valid encounter within last 12 months    Recent Outpatient Visits          2 months ago Stressful life event affecting family   [place], MD   7 months ago Essential hypertension   [place], Deborah B, MD   1 year ago Need for influenza vaccination   [place], RPH-CPP   1 year ago Essential hypertension   [place], Deborah B, MD   1 year ago Rhinovirus   [place], [place], NP      Future Appointments            In 3 weeks Hilty, Nadean Corwin, MD [facility]

## 2021-11-03 ENCOUNTER — Ambulatory Visit: Payer: Medicare Other | Admitting: Clinical

## 2021-11-19 ENCOUNTER — Encounter: Payer: Self-pay | Admitting: Internal Medicine

## 2021-11-20 ENCOUNTER — Ambulatory Visit (HOSPITAL_BASED_OUTPATIENT_CLINIC_OR_DEPARTMENT_OTHER): Payer: Medicare Other | Admitting: Internal Medicine

## 2021-12-02 ENCOUNTER — Other Ambulatory Visit: Payer: Self-pay | Admitting: Internal Medicine

## 2021-12-02 DIAGNOSIS — R0981 Nasal congestion: Secondary | ICD-10-CM

## 2021-12-03 NOTE — Telephone Encounter (Signed)
Requested medication (s) are due for refill today:   Yes  Requested medication (s) are on the active medication list:   Yes  Future visit scheduled:   No   Last ordered: 12/02/2021 16 ml, 1 refill  Returned because a 90 day supply is being requested.   Requested Prescriptions  Pending Prescriptions Disp Refills   fluticasone (FLONASE) 50 MCG/ACT nasal spray [Pharmacy Med Name: FLUTICASONE PROP 50 MCG SPRAY] 48 mL 1    Sig: PLACE 2 SPRAYS INTO BOTH NOSTRILS DAILY AS NEEDED FOR ALLERGIES OR RHINITIS (NASAL CONGESTION).     Ear, Nose, and Throat: Nasal Preparations - Corticosteroids Passed - 12/02/2021  6:36 PM      Passed - Valid encounter within last 12 months    Recent Outpatient Visits           3 months ago Stressful life event affecting family   [place], MD   8 months ago Essential hypertension   [place], Deborah B, MD   1 year ago Need for influenza vaccination   [place], RPH-CPP   1 year ago Essential hypertension   [place], Deborah B, MD   1 year ago Rhinovirus   [place], [place], NP       Future Appointments             In 1 week Leandrew Koyanagi, MD [facility]

## 2021-12-09 ENCOUNTER — Encounter: Payer: Self-pay | Admitting: Internal Medicine

## 2021-12-12 ENCOUNTER — Other Ambulatory Visit: Payer: Self-pay | Admitting: Internal Medicine

## 2021-12-12 DIAGNOSIS — K219 Gastro-esophageal reflux disease without esophagitis: Secondary | ICD-10-CM

## 2021-12-12 NOTE — Telephone Encounter (Signed)
Requested Prescriptions  Pending Prescriptions Disp Refills   esomeprazole (NEXIUM) 40 MG capsule [Pharmacy Med Name: ESOMEPRAZOLE MAG DR 40 MG CAP] 90 capsule 1    Sig: TAKE 1 CAPSULE BY MOUTH EVERY DAY     Gastroenterology: Proton Pump Inhibitors Passed - 12/12/2021  1:28 AM      Passed - Valid encounter within last 12 months    Recent Outpatient Visits          4 months ago Stressful life event affecting family   [place] Ladell Pier, MD   8 months ago Essential hypertension   [place], Deborah B, MD   1 year ago Need for influenza vaccination   [place], RPH-CPP   1 year ago Essential hypertension   [place], Deborah B, MD   1 year ago Rhinovirus   [place], [place], NP      Future Appointments            In 4 days Leandrew Koyanagi, MD [facility]

## 2021-12-15 ENCOUNTER — Encounter: Payer: Self-pay | Admitting: *Deleted

## 2021-12-15 ENCOUNTER — Ambulatory Visit: Payer: Self-pay | Admitting: *Deleted

## 2021-12-15 NOTE — Telephone Encounter (Addendum)
See addendum, PCE address sent via My Chart.     Summary: ear clogged   Patient experiencing left ear clogged, no pain for 1 month and flonase is not working. Practice has no available appointments until February.        Chief Complaint: requesting appt due to left ear clogged Symptoms: left ear clogged feels full Frequency: x 1 month Pertinent Negatives: Patient denies drainage, pain can hear out of left ear  Disposition: [] ED /[] Urgent Care (no appt availability in office) / [] Appointment(In office/virtual)/ [x]  [facility] [facility]/ [x] Home Care/ [] Refused Recommended Disposition /[x] [place] [facility]/ []  Follow-up with PCP Additional Notes:  Please advise if earlier appt available with any provider this week or prior to 01/07/22. Offered resource of [employer] 12/16/21, PCE. Please advise . Requesting address for PCE sent via My Chart .  Addendum: address for PCE sent to patient via My Chart.        Reason for Disposition  Ear congestion  Answer Assessment - Initial Assessment Questions 1. LOCATION: "Which ear is involved?"       Left ear  2. SENSATION: "Describe how the ear feels." (e.g. stuffy, full, plugged)."      Clogged up , full  3. ONSET:  "When did the ear symptoms start?"       X 1 month  4. PAIN: "Do you also have an earache?" If Yes, ask: "How bad is it?" (Scale 1-10; or mild, moderate, severe)     none 5. CAUSE: "What do you think is causing the ear congestion?"     Not sure  6. URI: "Do you have a runny nose or cough?"      No  7. NASAL ALLERGIES: "Are there symptoms of hay fever, such as sneezing or a clear nasal discharge?"     No  8. PREGNANCY: "Is there any chance you are pregnant?" "When was your last menstrual period?"     na  Protocols used: Ear - Congestion-A-AH

## 2021-12-15 NOTE — Telephone Encounter (Signed)
Scheduled appt for patient at [place] information to [place].

## 2021-12-16 ENCOUNTER — Ambulatory Visit: Payer: Self-pay

## 2021-12-16 ENCOUNTER — Other Ambulatory Visit: Payer: Self-pay

## 2021-12-16 ENCOUNTER — Ambulatory Visit (INDEPENDENT_AMBULATORY_CARE_PROVIDER_SITE_OTHER): Payer: Commercial Managed Care - HMO | Admitting: Orthopaedic Surgery

## 2021-12-16 ENCOUNTER — Encounter: Payer: Self-pay | Admitting: Orthopaedic Surgery

## 2021-12-16 DIAGNOSIS — M25511 Pain in right shoulder: Secondary | ICD-10-CM | POA: Diagnosis not present

## 2021-12-16 DIAGNOSIS — G8929 Other chronic pain: Secondary | ICD-10-CM | POA: Diagnosis not present

## 2021-12-16 NOTE — Progress Notes (Signed)
Office Visit Note   Patient: Sarah Randall           Date of Birth: 1966-08-18           MRN: [id number] Visit Date: 12/16/2021              Requested by: Ladell Pier, MD [address] [place],  [place] [address] PCP: Ladell Pier, MD   Assessment & Plan: Visit Diagnoses:  1. Chronic right shoulder pain     Plan: [name] is a 56 year old female who is well-known to me for several years who comes in for evaluation of upper right arm pain started about 2 months ago.  She feels a pulling sensation in the medial side of the arm and she states that it feels subjectively swollen.  She felt some radiation of pain into the chest so she underwent a cardiac work-up in the ER which was negative.  She states that sometimes she has neck pain but not a constant radiculopathy.  She is left-hand dominant.  Denies any loss of range of motion.  Right shoulder exam shows well-maintained and preserved range of motion.  Manual muscle testing of the rotator cuff is intact.  No impingement signs.  Negative Spurling sign.  Impression is chronic right shoulder pain unclear etiology.  She has some minimal arthritis in the shoulder joint.  I offered trying a glenohumeral injection as a way to rule in or rule this out.  She would like to just monitor for now and she may try to see a new rheumatologist about getting diagnosis for fibromyalgia.  Total face to face encounter time was greater than 25 minutes and over half of this time was spent in counseling and/or coordination of care.  Follow-Up Instructions: No follow-ups on file.   Orders:  Orders Placed This Encounter  Procedures   XR Humerus Right   XR Shoulder Right   No orders of the defined types were placed in this encounter.     Procedures: No procedures performed   Clinical Data: No additional findings.   Subjective: Chief Complaint  Patient presents with   Neck - Pain   Right Upper Arm - Pain    HPI  Review of  Systems  Constitutional: Negative.   HENT: Negative.    Eyes: Negative.   Respiratory: Negative.    Cardiovascular: Negative.   Endocrine: Negative.   Musculoskeletal: Negative.   Neurological: Negative.   Hematological: Negative.   Psychiatric/Behavioral: Negative.    All other systems reviewed and are negative.   Objective: Vital Signs: LMP  (LMP Unknown)   Physical Exam Vitals and nursing note reviewed.  Constitutional:      Appearance: She is well-developed.  Pulmonary:     Effort: Pulmonary effort is normal.  Skin:    General: Skin is warm.     Capillary Refill: Capillary refill takes less than 2 seconds.  Neurological:     Mental Status: She is alert and oriented to person, place, and time.  Psychiatric:        Behavior: Behavior normal.        Thought Content: Thought content normal.        Judgment: Judgment normal.    Ortho Exam  Specialty Comments:  No specialty comments available.  Imaging: XR Humerus Right  Result Date: 12/16/2021 No acute or structural abnormalities  XR Shoulder Right  Result Date: 12/16/2021 Small inferior humeral head spur    PMFS History: Patient Active Problem List  Diagnosis Date Noted   Multiple joint pain 02/10/2021   Positive ANA (antinuclear antibody) 12/13/2020   Trigger finger of left thumb 12/10/2020   Lumbar radiculopathy 11/07/2020   Chronic right-sided low back pain with right-sided sciatica 10/10/2020   Sleep disturbance 10/10/2020   Myalgia 10/10/2020   Super obese 10/10/2020   Abnormality of gait 10/10/2020   Statin intolerance 09/17/2020   Recurrent boils 09/17/2020   Chronic bilateral low back pain 05/16/2020   Postoperative state 12/27/2019   Iron deficiency 08/03/2019   B12 deficiency 06/30/2019   Chronic right shoulder pain 07/05/2018   Trochanteric bursitis of left hip 07/05/2018   Morbid obesity ([place]) 07/05/2018   Body mass index 45.0-49.9, adult ([place]) 07/05/2018   Marijuana user 05/26/2018    Status post total right knee replacement 01/12/2018   Status post total hip replacement, left 09/22/2017   Hyperlipidemia 08/10/2017   Mild depression 06/25/2017   Essential hypertension 06/25/2017   Tobacco abuse 06/25/2017   Prediabetes 06/25/2017   OSA on CPAP 06/25/2017   Bronchitis 09/24/2016   Leukocytosis 09/24/2016   Radiculopathy, lumbar region 07/16/2016   Degenerative lumbar disc 07/03/2016   Synovitis of hip 04/20/2016   Screening for breast cancer 12/28/2014   Pain in joint involving lower leg 05/13/2013   Past Medical History:  Diagnosis Date   Anemia    Anxiety    Arthritis    B12 deficiency 06/30/2019   Depression    GERD (gastroesophageal reflux disease)    occasionally takes OTC   History of leukocytosis    Hyperlipidemia    Hypertension    Low back pain    Obesity    Pain in right buttock    Goes down to Right leg to lateral aspect of calf.   Pre-diabetes    Sleep apnea    tested 2014 - unable to tolerate machine   Spondylolisthesis at L4-L5 level     Family History  Problem Relation Age of Onset   Hypertension Mother    Diabetes Mother    Breast cancer Mother 76   Hypertension Father    Lung cancer Maternal Aunt    Colon polyps Brother    Colon cancer Neg Hx    Esophageal cancer Neg Hx    Rectal cancer Neg Hx    Stomach cancer Neg Hx     Past Surgical History:  Procedure Laterality Date   ABDOMINAL EXPOSURE N/A 01/22/2021   Procedure: LATERAL EXPOSURE FOR OBLIQUE LATERAL INTERBODY FUSION;  Surgeon: Marty Heck, MD;  Location: [facility];  Service: Vascular;  Laterality: N/A;   ABDOMINAL HYSTERECTOMY     ANTERIOR LUMBAR FUSION N/A 01/22/2021   Procedure: Oblique Lateral Interbody Fusion Lumbar Four-Lumbar Five;  Surgeon: Vallarie Mare, MD;  Location: [facility];  Service: Neurosurgery;  Laterality: N/A;   LEFT HEART CATH AND CORONARY ANGIOGRAPHY N/A 10/25/2018   Procedure: LEFT HEART CATH AND CORONARY ANGIOGRAPHY;  Surgeon: Martin[place],  Peter M, MD;  Location: [facility];  Service: Cardiovascular;  Laterality: N/A;   LUMBAR PERCUTANEOUS PEDICLE SCREW 1 LEVEL N/A 01/22/2021   Procedure: LUMBAR PERCUTANEOUS PEDICLE SCREW LUBAR FOUR- FIVE;  Surgeon: Vallarie Mare, MD;  Location: [facility];  Service: Neurosurgery;  Laterality: N/A;   ROBOTIC ASSISTED LAPAROSCOPIC HYSTERECTOMY AND SALPINGECTOMY Bilateral 12/27/2019   Procedure: XI ROBOTIC ASSISTED TOTAL LAPAROSCOPIC HYSTERECTOMY AND SALPINGECTOMY;  Surgeon: Princess Bruins, MD;  Location: [facility];  Service: Gynecology;  Laterality: Bilateral;   TOTAL HIP ARTHROPLASTY Left 09/22/2017  Procedure: LEFT TOTAL HIP ARTHROPLASTY ANTERIOR APPROACH;  Surgeon: Leandrew Koyanagi, MD;  Location: [facility];  Service: Orthopedics;  Laterality: Left;   TOTAL KNEE ARTHROPLASTY Right 01/12/2018   Procedure: RIGHT TOTAL KNEE ARTHROPLASTY;  Surgeon: Leandrew Koyanagi, MD;  Location: [facility];  Service: Orthopedics;  Laterality: Right;   TUBAL LIGATION     Social History   Occupational History   Occupation: unemployed  Tobacco Use   Smoking status: Every Day    Packs/day: 0.25    Years: 30.00    Pack years: 7.50    Types: Cigarettes   Smokeless tobacco: Never  Vaping Use   Vaping Use: Never used  Substance and Sexual Activity   Alcohol use: Yes    Alcohol/week: 4.0 - 6.0 standard drinks    Types: 4 - 6 Shots of liquor per week    Comment: occ   Drug use: Yes    Frequency: 2.0 times per week    Types: Marijuana   Sexual activity: Yes    Birth control/protection: None    Comment: declined insurance questions,des neg

## 2021-12-17 ENCOUNTER — Encounter: Payer: Self-pay | Admitting: Nurse Practitioner

## 2021-12-17 ENCOUNTER — Ambulatory Visit (INDEPENDENT_AMBULATORY_CARE_PROVIDER_SITE_OTHER): Payer: Commercial Managed Care - HMO | Admitting: Nurse Practitioner

## 2021-12-17 VITALS — BP 118/84 | HR 91 | Resp 20 | Ht 64.0 in | Wt 282.1 lb

## 2021-12-17 DIAGNOSIS — H6690 Otitis media, unspecified, unspecified ear: Secondary | ICD-10-CM

## 2021-12-17 MED ORDER — AMOXICILLIN-POT CLAVULANATE 875-125 MG PO TABS: 1.0000 | ORAL_TABLET | Freq: Two times a day (BID) | ORAL | 0 refills | Status: DC

## 2021-12-17 MED ORDER — NEOMYCIN-POLYMYXIN-HC 3.5-10000-1 OT SOLN: 3.0000 [drp] | Freq: Three times a day (TID) | OTIC | 0 refills | Status: AC

## 2021-12-17 NOTE — Patient Instructions (Addendum)
Left otitis media:  Will order Augmentin  Stay well hydrated  Continue flonase  Continue claritin  Follow up:  Follow up as needed

## 2021-12-17 NOTE — Progress Notes (Signed)
@Patient  ID: Sarah Randall, female    DOB: May 03, 1966, 56 y.o.   MRN: [id number]  Chief Complaint  Patient presents with   Ear Pain    Referring provider: Ladell Pier, MD   HPI  Patient presents today with left ear pain.  She states that this pain has been going on for a month.  She has tried Claritin and Flonase with minimal relief noted.  She denies any significant fever.Denies f/c/s, n/v/d, hemoptysis, PND, chest pain or edema .     Allergies  Allergen Reactions   Pregabalin Swelling   Cymbalta [Duloxetine Hcl] Nausea Only   Gabapentin Swelling   Lipitor [Atorvastatin] Other (See Comments)    Stabbing pains in legs   Pravachol [Pravastatin]     Sharp pains in LT leg   Zoloft [Sertraline Hcl] Other (See Comments)    tremors    Immunization History  Administered Date(s) Administered   Influenza,inj,Quad PF,6+ Mos 08/10/2017, 08/06/2019, 09/16/2020   Influenza-Unspecified 08/24/2019   PFIZER(Purple Top)SARS-COV-2 Vaccination 02/18/2020, 03/11/2020   PNEUMOCOCCAL CONJUGATE-20 08/06/2021   Pneumococcal Polysaccharide-23 01/13/2018   Tdap 09/16/2020    Past Medical History:  Diagnosis Date   Anemia    Anxiety    Arthritis    B12 deficiency 06/30/2019   Depression    GERD (gastroesophageal reflux disease)    occasionally takes OTC   History of leukocytosis    Hyperlipidemia    Hypertension    Low back pain    Obesity    Pain in right buttock    Goes down to Right leg to lateral aspect of calf.   Pre-diabetes    Sleep apnea    tested 2014 - unable to tolerate machine   Spondylolisthesis at L4-L5 level     Tobacco History: Social History   Tobacco Use  Smoking Status Every Day   Packs/day: 0.25   Years: 30.00   Pack years: 7.50   Types: Cigarettes  Smokeless Tobacco Never   Ready to quit: No Counseling given: Yes   Outpatient Encounter Medications as of 12/17/2021  Medication Sig   amoxicillin-clavulanate (AUGMENTIN) 875-125 MG tablet  Take 1 tablet by mouth 2 (two) times daily.   Cyanocobalamin (B-12) 5000 MCG CAPS Take 5,000 mcg by mouth every other day.   escitalopram (LEXAPRO) 10 MG tablet Take 1 tablet (10 mg total) by mouth daily.   esomeprazole (NEXIUM) 40 MG capsule TAKE 1 CAPSULE BY MOUTH EVERY DAY   fluticasone (FLONASE) 50 MCG/ACT nasal spray PLACE 2 SPRAYS INTO BOTH NOSTRILS DAILY AS NEEDED FOR ALLERGIES OR RHINITIS (NASAL CONGESTION).   hydrochlorothiazide (HYDRODIURIL) 25 MG tablet TAKE 1 TABLET BY MOUTH EVERY DAY   loratadine (CLARITIN) 10 MG tablet TAKE 1 TABLET BY MOUTH EVERY DAY AS NEEDED FOR ALLERGY   losartan (COZAAR) 50 MG tablet Take 1 tablet (50 mg total) by mouth daily.   neomycin-polymyxin-hydrocortisone (CORTISPORIN) OTIC solution Place 3 drops into the left ear 3 (three) times daily for 7 days.   propranolol (INDERAL) 10 MG tablet TAKE 1 TABLET BY MOUTH TWICE A DAY   nitroGLYCERIN (NITROSTAT) 0.4 MG SL tablet Place 1 tablet (0.4 mg total) under the tongue every 5 (five) minutes as needed.   No facility-administered encounter medications on file as of 12/17/2021.     Review of Systems  Review of Systems  Constitutional: Negative.   HENT:  Positive for ear pain.   Cardiovascular: Negative.   Gastrointestinal: Negative.   Allergic/Immunologic: Negative.   Neurological: Negative.  Psychiatric/Behavioral: Negative.        Physical Exam  BP 118/84    Pulse 91    Resp 20    Ht 5\' 4"  (1.626 m)    Wt 282 lb 2 oz (128 kg)    LMP  (LMP Unknown)    SpO2 99%    BMI 48.43 kg/m   Wt Readings from Last 5 Encounters:  12/17/21 282 lb 2 oz (128 kg)  08/06/21 280 lb 6.4 oz (127.2 kg)  07/01/21 278 lb (126.1 kg)  05/08/21 278 lb 3.5 oz (126.2 kg)  05/06/21 278 lb 4.8 oz (126.2 kg)     Physical Exam Vitals and nursing note reviewed.  Constitutional:      General: She is not in acute distress.    Appearance: She is well-developed.  HENT:     Left Ear: Tympanic membrane is injected and  erythematous.  Cardiovascular:     Rate and Rhythm: Normal rate and regular rhythm.  Pulmonary:     Effort: Pulmonary effort is normal.     Breath sounds: Normal breath sounds.  Neurological:     Mental Status: She is alert and oriented to person, place, and time.     Lab Results:  CBC    Component Value Date/Time   WBC 13.2 (H) 05/06/2021 1212   RBC 5.04 05/06/2021 1212   HGB 15.3 (H) 05/06/2021 1212   HGB 14.9 11/24/2019 1339   HGB 14.9 09/07/2019 1601   HCT 46.0 05/06/2021 1212   HCT 44.2 09/07/2019 1601   PLT 437 (H) 05/06/2021 1212   PLT 330 11/24/2019 1339   PLT 420 11/14/2018 1546   MCV 91.3 05/06/2021 1212   MCV 91 11/14/2018 1546   MCH 30.4 05/06/2021 1212   MCHC 33.3 05/06/2021 1212   RDW 13.3 05/06/2021 1212   RDW 12.7 11/14/2018 1546   LYMPHSABS 5.3 (H) 11/24/2019 1339   LYMPHSABS 5.5 (H) 11/14/2018 1546   MONOABS 0.7 11/24/2019 1339   EOSABS 0.3 11/24/2019 1339   EOSABS 0.3 11/14/2018 1546   BASOSABS 0.1 11/24/2019 1339   BASOSABS 0.1 11/14/2018 1546    BMET    Component Value Date/Time   NA 133 (L) 05/06/2021 1212   NA 138 06/20/2019 0943   K 3.4 (L) 05/06/2021 1212   CL 98 05/06/2021 1212   CO2 25 05/06/2021 1212   GLUCOSE 115 (H) 05/06/2021 1212   BUN 8 05/06/2021 1212   BUN 12 06/20/2019 0943   CREATININE 0.81 05/06/2021 1212   CREATININE 0.94 06/15/2019 1012   CALCIUM 9.1 05/06/2021 1212   GFRNONAA >60 05/06/2021 1212   GFRNONAA >60 06/15/2019 1012   GFRAA >60 09/06/2020 0606   GFRAA >60 06/15/2019 1012    BNP No results found for: BNP  ProBNP No results found for: PROBNP  Imaging: XR Humerus Right  Result Date: 12/16/2021 No acute or structural abnormalities  XR Shoulder Right  Result Date: 12/16/2021 Small inferior humeral head spur    Assessment & Plan:   Acute otitis media Will order Augmentin  Stay well hydrated  Continue flonase  Continue claritin  Follow up:  Follow up as needed     Fenton Foy, NP 12/23/2021

## 2021-12-17 NOTE — Progress Notes (Signed)
Left ear pain and feels full X 1 month

## 2021-12-23 ENCOUNTER — Encounter: Payer: Self-pay | Admitting: Nurse Practitioner

## 2021-12-23 DIAGNOSIS — H6690 Otitis media, unspecified, unspecified ear: Secondary | ICD-10-CM | POA: Insufficient documentation

## 2021-12-23 NOTE — Assessment & Plan Note (Signed)
Will order Augmentin  Stay well hydrated  Continue flonase  Continue claritin  Follow up:  Follow up as needed

## 2022-01-01 ENCOUNTER — Ambulatory Visit: Payer: Commercial Managed Care - HMO | Admitting: Internal Medicine

## 2022-01-01 ENCOUNTER — Other Ambulatory Visit: Payer: Self-pay | Admitting: Internal Medicine

## 2022-01-01 ENCOUNTER — Encounter: Payer: Self-pay | Admitting: Internal Medicine

## 2022-01-01 DIAGNOSIS — I10 Essential (primary) hypertension: Secondary | ICD-10-CM

## 2022-01-01 DIAGNOSIS — G250 Essential tremor: Secondary | ICD-10-CM

## 2022-01-01 DIAGNOSIS — G43011 Migraine without aura, intractable, with status migrainosus: Secondary | ICD-10-CM

## 2022-01-01 NOTE — Telephone Encounter (Signed)
Requested Prescriptions  Pending Prescriptions Disp Refills   propranolol (INDERAL) 10 MG tablet [Pharmacy Med Name: PROPRANOLOL 10 MG TABLET] 180 tablet 0    Sig: TAKE 1 TABLET BY MOUTH TWICE A DAY     Cardiovascular:  Beta Blockers Passed - 01/01/2022  1:21 AM      Passed - Last BP in normal range    BP Readings from Last 1 Encounters:  12/17/21 118/84         Passed - Last Heart Rate in normal range    Pulse Readings from Last 1 Encounters:  12/17/21 91         Passed - Valid encounter within last 6 months    Recent Outpatient Visits          4 months ago Stressful life event affecting family   [place], Dalbert [place], MD   9 months ago Essential hypertension   [place], Deborah B, MD   1 year ago Need for influenza vaccination   [place], Stephen L, RPH-CPP   1 year ago Essential hypertension   [place], MD   1 year ago Rhinovirus   [place], [place], NP      Future Appointments            In 1 week Ladell Pier, MD [place]            hydrochlorothiazide (HYDRODIURIL) 25 MG tablet [Pharmacy Med Name: HYDROCHLOROTHIAZIDE 25 MG TAB] 90 tablet 0    Sig: TAKE 1 TABLET BY MOUTH EVERY DAY     Cardiovascular: Diuretics - Thiazide Failed - 01/01/2022  1:21 AM      Failed - K in normal range and within 360 days    Potassium  Date Value Ref Range Status  05/06/2021 3.4 (L) 3.5 - 5.1 mmol/L Final         Failed - Na in normal range and within 360 days    Sodium  Date Value Ref Range Status  05/06/2021 133 (L) 135 - 145 mmol/L Final  06/20/2019 138 134 - 144 mmol/L Final         Passed - Ca in normal range and within 360 days    Calcium  Date Value Ref Range Status  05/06/2021 9.1 8.9 - 10.3 mg/dL Final    Calcium, Ion  Date Value Ref Range Status  01/22/2021 1.11 (L) 1.15 - 1.40 mmol/L Final         Passed - Cr in normal range and within 360 days    Creatinine  Date Value Ref Range Status  06/15/2019 0.94 0.44 - 1.00 mg/dL Final  08/10/2017 150.1 20.0 - 300.0 mg/dL Final   Creatinine, Ser  Date Value Ref Range Status  05/06/2021 0.81 0.44 - 1.00 mg/dL Final         Passed - Last BP in normal range    BP Readings from Last 1 Encounters:  12/17/21 118/84         Passed - Valid encounter within last 6 months    Recent Outpatient Visits          4 months ago Stressful life event affecting family   [place], Deborah B, MD   9 months ago Essential hypertension   [place], MD   1 year ago Need for influenza vaccination   [place], RPH-CPP   1 year ago Essential hypertension   [place], Deborah B, MD   1 year ago Rhinovirus   [place], [place], NP      Future Appointments            In 1 week Ladell Pier, MD [place]

## 2022-01-04 ENCOUNTER — Other Ambulatory Visit: Payer: Self-pay | Admitting: Internal Medicine

## 2022-01-04 DIAGNOSIS — I10 Essential (primary) hypertension: Secondary | ICD-10-CM

## 2022-01-04 NOTE — Telephone Encounter (Signed)
Requested Prescriptions  Pending Prescriptions Disp Refills   losartan (COZAAR) 50 MG tablet [Pharmacy Med Name: LOSARTAN POTASSIUM 50 MG TAB] 90 tablet 0    Sig: TAKE 1 TABLET BY MOUTH EVERY DAY     Cardiovascular:  Angiotensin Receptor Blockers Failed - 01/04/2022 12:48 AM      Failed - Cr in normal range and within 180 days    Creatinine  Date Value Ref Range Status  06/15/2019 0.94 0.44 - 1.00 mg/dL Final  08/10/2017 150.1 20.0 - 300.0 mg/dL Final   Creatinine, Ser  Date Value Ref Range Status  05/06/2021 0.81 0.44 - 1.00 mg/dL Final         Failed - K in normal range and within 180 days    Potassium  Date Value Ref Range Status  05/06/2021 3.4 (L) 3.5 - 5.1 mmol/L Final         Passed - Patient is not pregnant      Passed - Last BP in normal range    BP Readings from Last 1 Encounters:  12/17/21 118/84         Passed - Valid encounter within last 6 months    Recent Outpatient Visits          4 months ago Stressful life event affecting family   [place], Deborah B, MD   9 months ago Essential hypertension   [place], Deborah B, MD   1 year ago Need for influenza vaccination   [place], Jarome [place], RPH-CPP   1 year ago Essential hypertension   [place], Deborah B, MD   2 years ago Rhinovirus   [place], [place], NP      Future Appointments            In 4 days Ladell Pier, MD [place]

## 2022-01-08 ENCOUNTER — Encounter: Payer: Self-pay | Admitting: Internal Medicine

## 2022-01-08 ENCOUNTER — Ambulatory Visit: Payer: Medicare Other | Attending: Internal Medicine | Admitting: Internal Medicine

## 2022-01-08 ENCOUNTER — Other Ambulatory Visit: Payer: Self-pay

## 2022-01-08 DIAGNOSIS — F320 Major depressive disorder, single episode, mild: Secondary | ICD-10-CM

## 2022-01-08 DIAGNOSIS — M25511 Pain in right shoulder: Secondary | ICD-10-CM

## 2022-01-08 DIAGNOSIS — Z789 Other specified health status: Secondary | ICD-10-CM | POA: Diagnosis not present

## 2022-01-08 DIAGNOSIS — G8929 Other chronic pain: Secondary | ICD-10-CM

## 2022-01-08 DIAGNOSIS — E1169 Type 2 diabetes mellitus with other specified complication: Secondary | ICD-10-CM | POA: Diagnosis not present

## 2022-01-08 DIAGNOSIS — I10 Essential (primary) hypertension: Secondary | ICD-10-CM

## 2022-01-08 DIAGNOSIS — Z23 Encounter for immunization: Secondary | ICD-10-CM | POA: Diagnosis not present

## 2022-01-08 DIAGNOSIS — R0982 Postnasal drip: Secondary | ICD-10-CM

## 2022-01-08 DIAGNOSIS — F17200 Nicotine dependence, unspecified, uncomplicated: Secondary | ICD-10-CM

## 2022-01-08 DIAGNOSIS — E782 Mixed hyperlipidemia: Secondary | ICD-10-CM | POA: Diagnosis not present

## 2022-01-08 LAB — POCT GLYCOSYLATED HEMOGLOBIN (HGB A1C): HbA1c, POC (controlled diabetic range): 6.7 % (ref 0.0–7.0)

## 2022-01-08 MED ORDER — LOSARTAN POTASSIUM 100 MG PO TABS: 100.0000 mg | ORAL_TABLET | Freq: Every day | ORAL | 6 refills | Status: DC

## 2022-01-08 MED ORDER — DICLOFENAC SODIUM 1 % EX GEL: 2.0000 g | Freq: Four times a day (QID) | CUTANEOUS | 1 refills | Status: DC

## 2022-01-08 MED ORDER — METFORMIN HCL 500 MG PO TABS: 500.0000 mg | ORAL_TABLET | Freq: Every day | ORAL | 5 refills | Status: DC

## 2022-01-08 NOTE — Progress Notes (Signed)
Patient ID: Sarah Randall, female    DOB: Feb 11, 1966  MRN: [id number]  CC: Hypertension and Prediabetes (A1C-6.7)   Subjective: Sarah Randall is a 56 y.o. female who presents for chronic ds management Her concerns today include:  Pt with hx of DM/obesity, HTN, HL (on statin 2 x a wk -cramps),  tob abuse, OSA has CPAP but not sing, obesity,GERD, depression, IDA, B12 def (Dr. Burr Medico),  DDD of lumbar spine, OA RT hip and LT knee s/p jt replacements.  DM/obesity: A1C 6.7 today. Eating habits:  "My problem is [employer]." Not as active as she would like to be.  Tried going to gym to work out but 2 days later, body would be too sore. Spoke with Dr. Erlinda Hong about it; told exercise causes bursitis LT hip to flare up. Tried water aerobics but still too sore afterwards.   HTN: She is on HCTZ and Cozaar.  Took meds already this a.m and consistent with taking them.  She has a home blood pressure device but it no longer works.  She tries to limit salt in the foods.  HL: She has been intolerant of statins.  Last lipid profile showed LDL of 208.  Referred her to lipid clinic.  Patient states she was called for an appointment but at that time she had COVID and never bothered to call them back to reschedule.  Tob dep:  smoking 4-5 days.  She feels that she needs to quit but not ready to quit mentally.  MDD/Anx:  counseling with LCSW helping a little.  "Helps to get it all out, there were a lot of stuff I had been holding in for yrs." Took Lexapro for about 3 wks.  Did not like the way it made it feel so she d/c taking.  Made her feel out of it.  She is also not tolerated Cymbalta or Zoloft in the past. PHQ 9 score improved.  She reports the depression is better but not complete remission.  Would like to see psychiatry for med management.  Had LT ear infection.  Given Augmentin.  Better Gets drainaged from nose to back of throat.  Causes cough at nights.  Taking Claritin but not every day. Uses Flonase   daily  RT shoulder:  1 yr ago was having pain RT arm going into chest.  Seen in ER.  Told [facility]K in nature.  Sent to Dr. Benjamine Mola.  She tells me he did some type of ultrasound on her arm.  However I reviewed his note from 12/10/2020.  In his note, he reported that patient presented for evaluation of pain in multiple sites especially the back and the lower extremities.  Pain went away but came back about 6 months ago. He describes the pain as a pulling from shoulder blade to upper arm.  She points to the muscles in medial upper arm.  It also goes under the arm to the axilla area.   Saw Dr. Erlinda Hong recently for this.  He did not find any impingement signs.  X-ray of the shoulder showed minimal arthritis in the joint.  He offered her a steroid injection but patient declined. Patient Active Problem List   Diagnosis Date Noted   Acute otitis media 12/23/2021   Multiple joint pain 02/10/2021   Positive ANA (antinuclear antibody) 12/13/2020   Trigger finger of left thumb 12/10/2020   Lumbar radiculopathy 11/07/2020   Chronic right-sided low back pain with right-sided sciatica 10/10/2020   Sleep disturbance 10/10/2020   Myalgia  10/10/2020   Super obese 10/10/2020   Abnormality of gait 10/10/2020   Statin intolerance 09/17/2020   Recurrent boils 09/17/2020   Chronic bilateral low back pain 05/16/2020   Postoperative state 12/27/2019   Iron deficiency 08/03/2019   B12 deficiency 06/30/2019   Chronic right shoulder pain 07/05/2018   Trochanteric bursitis of left hip 07/05/2018   Morbid obesity ([place]) 07/05/2018   Body mass index 45.0-49.9, adult ([place]) 07/05/2018   Marijuana user 05/26/2018   Status post total right knee replacement 01/12/2018   Status post total hip replacement, left 09/22/2017   Hyperlipidemia 08/10/2017   Mild depression 06/25/2017   Essential hypertension 06/25/2017   Tobacco abuse 06/25/2017   Prediabetes 06/25/2017   OSA on CPAP 06/25/2017   Bronchitis 09/24/2016   Leukocytosis  09/24/2016   Radiculopathy, lumbar region 07/16/2016   Degenerative lumbar disc 07/03/2016   Synovitis of hip 04/20/2016   Screening for breast cancer 12/28/2014   Type 2 diabetes mellitus with obesity ([place]) 04/06/2014   Pain in joint involving lower leg 05/13/2013     Current Outpatient Medications on File Prior to Visit  Medication Sig Dispense Refill   amoxicillin-clavulanate (AUGMENTIN) 875-125 MG tablet Take 1 tablet by mouth 2 (two) times daily. 20 tablet 0   Cyanocobalamin (B-12) 5000 MCG CAPS Take 5,000 mcg by mouth every other day.     escitalopram (LEXAPRO) 10 MG tablet Take 1 tablet (10 mg total) by mouth daily. 90 tablet 1   esomeprazole (NEXIUM) 40 MG capsule TAKE 1 CAPSULE BY MOUTH EVERY DAY 90 capsule 1   fluticasone (FLONASE) 50 MCG/ACT nasal spray PLACE 2 SPRAYS INTO BOTH NOSTRILS DAILY AS NEEDED FOR ALLERGIES OR RHINITIS (NASAL CONGESTION). 16 mL 1   hydrochlorothiazide (HYDRODIURIL) 25 MG tablet TAKE 1 TABLET BY MOUTH EVERY DAY 90 tablet 0   loratadine (CLARITIN) 10 MG tablet TAKE 1 TABLET BY MOUTH EVERY DAY AS NEEDED FOR ALLERGY 90 tablet 1   losartan (COZAAR) 50 MG tablet TAKE 1 TABLET BY MOUTH EVERY DAY 90 tablet 0   nitroGLYCERIN (NITROSTAT) 0.4 MG SL tablet Place 1 tablet (0.4 mg total) under the tongue every 5 (five) minutes as needed. 25 tablet 11   propranolol (INDERAL) 10 MG tablet TAKE 1 TABLET BY MOUTH TWICE A DAY 180 tablet 0   No current facility-administered medications on file prior to visit.    Allergies  Allergen Reactions   Pregabalin Swelling   Cymbalta [Duloxetine Hcl] Nausea Only   Gabapentin Swelling   Lipitor [Atorvastatin] Other (See Comments)    Stabbing pains in legs   Pravachol [Pravastatin]     Sharp pains in LT leg   Zoloft [Sertraline Hcl] Other (See Comments)    tremors    Social History   Socioeconomic History   Marital status: Single    Spouse name: Not on file   Number of children: 2   Years of education: 9   Highest  education level: Not on file  Occupational History   Occupation: unemployed  Tobacco Use   Smoking status: Every Day    Packs/day: 0.25    Years: 30.00    Pack years: 7.50    Types: Cigarettes   Smokeless tobacco: Never  Vaping Use   Vaping Use: Never used  Substance and Sexual Activity   Alcohol use: Yes    Alcohol/week: 4.0 - 6.0 standard drinks    Types: 4 - 6 Shots of liquor per week    Comment: occ   Drug use:  Yes    Frequency: 2.0 times per week    Types: Marijuana   Sexual activity: Yes    Birth control/protection: None    Comment: declined insurance questions,des neg  Other Topics Concern   Not on file  Social History Narrative   Not on file   Social Determinants of Health   Financial Resource Strain: Not on file  Food Insecurity: Not on file  Transportation Needs: Not on file  Physical Activity: Not on file  Stress: Not on file  Social Connections: Not on file  Intimate Partner Violence: Not on file    Family History  Problem Relation Age of Onset   Hypertension Mother    Diabetes Mother    Breast cancer Mother 81   Hypertension Father    Lung cancer Maternal Aunt    Colon polyps Brother    Colon cancer Neg Hx    Esophageal cancer Neg Hx    Rectal cancer Neg Hx    Stomach cancer Neg Hx     Past Surgical History:  Procedure Laterality Date   ABDOMINAL EXPOSURE N/A 01/22/2021   Procedure: LATERAL EXPOSURE FOR OBLIQUE LATERAL INTERBODY FUSION;  Surgeon: Marty Heck, MD;  Location: [facility];  Service: Vascular;  Laterality: N/A;   ABDOMINAL HYSTERECTOMY     ANTERIOR LUMBAR FUSION N/A 01/22/2021   Procedure: Oblique Lateral Interbody Fusion Lumbar Four-Lumbar Five;  Surgeon: Vallarie Mare, MD;  Location: [facility];  Service: Neurosurgery;  Laterality: N/A;   LEFT HEART CATH AND CORONARY ANGIOGRAPHY N/A 10/25/2018   Procedure: LEFT HEART CATH AND CORONARY ANGIOGRAPHY;  Surgeon: Martinique, Peter M, MD;  Location: [facility];  Service:  Cardiovascular;  Laterality: N/A;   LUMBAR PERCUTANEOUS PEDICLE SCREW 1 LEVEL N/A 01/22/2021   Procedure: LUMBAR PERCUTANEOUS PEDICLE SCREW LUBAR FOUR- FIVE;  Surgeon: Vallarie Mare, MD;  Location: [facility];  Service: Neurosurgery;  Laterality: N/A;   ROBOTIC ASSISTED LAPAROSCOPIC HYSTERECTOMY AND SALPINGECTOMY Bilateral 12/27/2019   Procedure: XI ROBOTIC ASSISTED TOTAL LAPAROSCOPIC HYSTERECTOMY AND SALPINGECTOMY;  Surgeon: Princess Bruins, MD;  Location: [facility];  Service: Gynecology;  Laterality: Bilateral;   TOTAL HIP ARTHROPLASTY Left 09/22/2017   Procedure: LEFT TOTAL HIP ARTHROPLASTY ANTERIOR APPROACH;  Surgeon: Leandrew Koyanagi, MD;  Location: [facility];  Service: Orthopedics;  Laterality: Left;   TOTAL KNEE ARTHROPLASTY Right 01/12/2018   Procedure: RIGHT TOTAL KNEE ARTHROPLASTY;  Surgeon: Leandrew Koyanagi, MD;  Location: [facility];  Service: Orthopedics;  Laterality: Right;   TUBAL LIGATION      ROS: Review of Systems Negative except as stated above  PHYSICAL EXAM: BP 138/89    Pulse 91    Resp 16    Wt 281 lb 3.2 oz (127.6 kg)    LMP  (LMP Unknown)    SpO2 97%    BMI 48.27 kg/m   Wt Readings from Last 3 Encounters:  01/08/22 281 lb 3.2 oz (127.6 kg)  12/17/21 282 lb 2 oz (128 kg)  08/06/21 280 lb 6.4 oz (127.2 kg)   Repeat BP: 139/95 Physical Exam General appearance - alert, well appearing, and in no distress Mental status - normal mood, behavior, speech, dress, motor activity, and thought processes Neck - supple, no significant adenopathy Chest - clear to auscultation, no wheezes, rales or rhonchi, symmetric air entry Heart - normal rate, regular rhythm, normal S1, S2, no murmurs, rubs, clicks or gallops Musculoskeletal -right shoulder: No point tenderness of the shoulder joint.  She has good  range of motion of the joint.  Drop arm test negative.  Mild tenderness over the right upper scapular.  No tenderness on palpation of the bicep and tricep muscle.  No mass felt in  the muscles of the upper arm.  No axillary lymphadenopathy appreciated.  The upper arm does not appear swollen compared to the left. Extremities -no lower extremity edema.  Depression screen [facility] 2/9 01/08/2022 10/15/2021 09/24/2021  Decreased Interest 1 1 1   Down, Depressed, Hopeless 1 1 1   PHQ - 2 Score 2 2 2   Altered sleeping 1 1 1   Tired, decreased energy 1 2 2   Change in appetite 0 0 0  Feeling bad or failure about yourself  1 1 1   Trouble concentrating 0 1 1  Moving slowly or fidgety/restless 0 0 2  Suicidal thoughts 0 1 1  PHQ-9 Score 5 8 10   Some recent data might be hidden    CMP Latest Ref Rng & Units 05/06/2021 01/22/2021 01/22/2021  Glucose 70 - 99 mg/dL 115(H) - -  BUN 6 - 20 mg/dL 8 - -  Creatinine 0.44 - 1.00 mg/dL 0.81 - -  Sodium 135 - 145 mmol/L 133(L) 133(L) 135  Potassium 3.5 - 5.1 mmol/L 3.4(L) 3.9 3.1(L)  Chloride 98 - 111 mmol/L 98 - -  CO2 22 - 32 mmol/L 25 - -  Calcium 8.9 - 10.3 mg/dL 9.1 - -  Total Protein 6.0 - 8.5 g/dL - - -  Total Bilirubin 0.0 - 1.2 mg/dL - - -  Alkaline Phos 44 - 121 IU/L - - -  AST 0 - 40 IU/L - - -  ALT 0 - 32 IU/L - - -   Lipid Panel     Component Value Date/Time   CHOL 284 (H) 03/18/2021 1446   TRIG 192 (H) 03/18/2021 1446   HDL 39 (L) 03/18/2021 1446   CHOLHDL 7.3 (H) 03/18/2021 1446   LDLCALC 208 (H) 03/18/2021 1446    CBC    Component Value Date/Time   WBC 13.2 (H) 05/06/2021 1212   RBC 5.04 05/06/2021 1212   HGB 15.3 (H) 05/06/2021 1212   HGB 14.9 11/24/2019 1339   HGB 14.9 09/07/2019 1601   HCT 46.0 05/06/2021 1212   HCT 44.2 09/07/2019 1601   PLT 437 (H) 05/06/2021 1212   PLT 330 11/24/2019 1339   PLT 420 11/14/2018 1546   MCV 91.3 05/06/2021 1212   MCV 91 11/14/2018 1546   MCH 30.4 05/06/2021 1212   MCHC 33.3 05/06/2021 1212   RDW 13.3 05/06/2021 1212   RDW 12.7 11/14/2018 1546   LYMPHSABS 5.3 (H) 11/24/2019 1339   LYMPHSABS 5.5 (H) 11/14/2018 1546   MONOABS 0.7 11/24/2019 1339   EOSABS 0.3  11/24/2019 1339   EOSABS 0.3 11/14/2018 1546   BASOSABS 0.1 11/24/2019 1339   BASOSABS 0.1 11/14/2018 1546    ASSESSMENT AND PLAN:  1. Type 2 diabetes mellitus with morbid obesity (HCC) Your A1c is good however it has increased.  Healthy eating habits discussed and encouraged.  Advised to eliminate sugary drinks from the diet.  Recommend starting a low-dose of metformin.  Went over signs and symptoms of hypoglycemia and how to treat. -Advised patient to try water aerobics again just twice a week separated by several days apart.  Start low by doing only 10 mins per session.  Gradually try to build up to 30 mins over a few mths. - POCT glycosylated hemoglobin (Hb A1C) - metFORMIN (GLUCOPHAGE) 500 MG tablet; Take  1 tablet (500 mg total) by mouth daily with breakfast.  Dispense: 30 tablet; Refill: 5 - Comprehensive metabolic panel - Microalbumin / creatinine urine ratio - Ambulatory referral to Ophthalmology  2. Essential hypertension Not at goal.  We will increase Cozaar to 100 mg daily.  However I did order chemistry today to see what her creatinine and GFR look like at this time - losartan (COZAAR) 100 MG tablet; Take 1 tablet (100 mg total) by mouth daily.  Dispense: 30 tablet; Refill: 6  3. Post-nasal drip Recommend taking the Claritin daily.  Continue Flonase  4. Chronic right shoulder pain Seems musculoskeletal in nature.  Not sure if it is in the upper arm muscles or from the joint itself.  We will give some Voltaren gel to use on the upper muscles and over the scapula.  Follow-up if no improvement. - diclofenac Sodium (VOLTAREN) 1 % GEL; Apply 2 g topically 4 (four) times daily.  Dispense: 100 g; Refill: 1  5. Tobacco dependence Advised to quit.  Patient not mentally ready to give a trial of quitting.  6. Mixed hyperlipidemia Patient agreeable for me to resubmit the referral to lipid clinic given that her LDL is significantly elevated and she is intolerant of statins.  Lipid  profile ordered today. - Lipid panel  7. Statin intolerance   8. Mild major depression ([place]) Patient seems to be benefiting from counseling.  We will also refer her to a psychiatrist for medication management.  We have tried her with several different antidepressant none of which she was able to tolerate.  9. Need for immunization against influenza - Flu Vaccine QUAD 51mo+IM (Fluarix, Fluzone & Alfiuria Quad PF)   Patient was given the opportunity to ask questions.  Patient verbalized understanding of the plan and was able to repeat key elements of the plan.   Orders Placed This Encounter  Procedures   Flu Vaccine QUAD 44mo+IM (Fluarix, Fluzone & Alfiuria Quad PF)     Requested Prescriptions    No prescriptions requested or ordered in this encounter    No follow-ups on file.  Karle Plumber, MD, FACP

## 2022-01-08 NOTE — Patient Instructions (Signed)
Diabetes Mellitus and Nutrition, Adult When you have diabetes, or diabetes mellitus, it is very important to have healthy eating habits because your blood sugar (glucose) levels are greatly affected by what you eat and drink. Eating healthy foods in the right amounts, at about the same times every day, can help you: Manage your blood glucose. Lower your risk of heart disease. Improve your blood pressure. Reach or maintain a healthy weight. What can affect my meal plan? Every person with diabetes is different, and each person has different needs for a meal plan. Your health care provider may recommend that you work with a dietitian to make a meal plan that is best for you. Your meal plan may vary depending on factors such as: The calories you need. The medicines you take. Your weight. Your blood glucose, blood pressure, and cholesterol levels. Your activity level. Other health conditions you have, such as heart or kidney disease. How do carbohydrates affect me? Carbohydrates, also called carbs, affect your blood glucose level more than any other type of food. Eating carbs raises the amount of glucose in your blood. It is important to know how many carbs you can safely have in each meal. This is different for every person. Your dietitian can help you calculate how many carbs you should have at each meal and for each snack. How does alcohol affect me? Alcohol can cause a decrease in blood glucose (hypoglycemia), especially if you use insulin or take certain diabetes medicines by mouth. Hypoglycemia can be a life-threatening condition. Symptoms of hypoglycemia, such as sleepiness, dizziness, and confusion, are similar to symptoms of having too much alcohol. Do not drink alcohol if: Your health care provider tells you not to drink. You are pregnant, may be pregnant, or are planning to become pregnant. If you drink alcohol: Limit how much you have to: 0-1 drink a day for women. 0-2 drinks a day  for men. Know how much alcohol is in your drink. In the [place] one drink equals one 12 oz bottle of beer (355 mL), one 5 oz glass of wine (148 mL), or one 1 oz glass of hard liquor (44 mL). Keep yourself hydrated with water, diet soda, or unsweetened iced tea. Keep in mind that regular soda, juice, and other mixers may contain a lot of sugar and must be counted as carbs. What are tips for following this plan? Reading food labels Start by checking the serving size on the Nutrition Facts label of packaged foods and drinks. The number of calories and the amount of carbs, fats, and other nutrients listed on the label are based on one serving of the item. Many items contain more than one serving per package. Check the total grams (g) of carbs in one serving. Check the number of grams of saturated fats and trans fats in one serving. Choose foods that have a low amount or none of these fats. Check the number of milligrams (mg) of salt (sodium) in one serving. Most people should limit total sodium intake to less than 2,300 mg per day. Always check the nutrition information of foods labeled as "low-fat" or "nonfat." These foods may be higher in added sugar or refined carbs and should be avoided. Talk to your dietitian to identify your daily goals for nutrients listed on the label. Shopping Avoid buying canned, pre-made, or processed foods. These foods tend to be high in fat, sodium, and added sugar. Shop around the outside edge of the grocery store. This is where you will  most often find fresh fruits and vegetables, bulk grains, fresh meats, and fresh dairy products. Cooking Use low-heat cooking methods, such as baking, instead of high-heat cooking methods, such as deep frying. Cook using healthy oils, such as olive, canola, or sunflower oil. Avoid cooking with butter, cream, or high-fat meats. Meal planning Eat meals and snacks regularly, preferably at the same times every day. Avoid going long periods of  time without eating. Eat foods that are high in fiber, such as fresh fruits, vegetables, beans, and whole grains. Eat 4-6 oz (112-168 g) of lean protein each day, such as lean meat, chicken, fish, eggs, or tofu. One ounce (oz) (28 g) of lean protein is equal to: 1 oz (28 g) of meat, chicken, or fish. 1 egg.  cup (62 g) of tofu. Eat some foods each day that contain healthy fats, such as avocado, nuts, seeds, and fish. What foods should I eat? Fruits Berries. Apples. Oranges. Peaches. Apricots. Plums. Grapes. Mangoes. Papayas. Pomegranates. Kiwi. Cherries. Vegetables Leafy greens, including lettuce, spinach, kale, chard, collard greens, mustard greens, and cabbage. Beets. Cauliflower. Broccoli. Carrots. Green beans. Tomatoes. Peppers. Onions. Cucumbers. Brussels sprouts. Grains Whole grains, such as whole-wheat or whole-grain bread, crackers, tortillas, cereal, and pasta. Unsweetened oatmeal. Quinoa. Brown or wild rice. Meats and other proteins Seafood. Poultry without skin. Lean cuts of poultry and beef. Tofu. Nuts. Seeds. Dairy Low-fat or fat-free dairy products such as milk, yogurt, and cheese. The items listed above may not be a complete list of foods and beverages you can eat and drink. Contact a dietitian for more information. What foods should I avoid? Fruits Fruits canned with syrup. Vegetables Canned vegetables. Frozen vegetables with butter or cream sauce. Grains Refined white flour and flour products such as bread, pasta, snack foods, and cereals. Avoid all processed foods. Meats and other proteins Fatty cuts of meat. Poultry with skin. Breaded or fried meats. Processed meat. Avoid saturated fats. Dairy Full-fat yogurt, cheese, or milk. Beverages Sweetened drinks, such as soda or iced tea. The items listed above may not be a complete list of foods and beverages you should avoid. Contact a dietitian for more information. Questions to ask a health care provider Do I need to  meet with a certified diabetes care and education specialist? Do I need to meet with a dietitian? What number can I call if I have questions? When are the best times to check my blood glucose? Where to find more information: American Diabetes Association: diabetes.org Academy of Nutrition and Dietetics: eatright.[employer] of Diabetes and Digestive and Kidney Diseases: AmenCredit.is Association of Diabetes Care & Education Specialists: diabeteseducator.org Summary It is important to have healthy eating habits because your blood sugar (glucose) levels are greatly affected by what you eat and drink. It is important to use alcohol carefully. A healthy meal plan will help you manage your blood glucose and lower your risk of heart disease. Your health care provider may recommend that you work with a dietitian to make a meal plan that is best for you. This information is not intended to replace advice given to you by your health care provider. Make sure you discuss any questions you have with your health care provider. Document Revised: 06/26/2020 Document Reviewed: 06/26/2020 Elsevier Patient Education  [place].  Diabetes Mellitus and Standards of Medical Care Living with and managing diabetes (diabetes mellitus) can be complicated. Your diabetes treatment may be managed by a team of health care providers, including: A physician who specializes in  diabetes (endocrinologist). You might also have visits with a nurse practitioner or physician assistant. Nurses. A registered dietitian. A certified diabetes care and education specialist. An exercise specialist. A pharmacist. An eye doctor. A foot specialist (podiatrist). A dental care provider. A primary care provider. A mental health care provider. How to manage your diabetes You can do many things to successfully manage your diabetes. Your health care providers will follow guidelines to help you get the best quality of  care. Here are general guidelines for your diabetes management plan. Your health care providers may give you more specific instructions. Physical exams When you are diagnosed with diabetes, and each year after that, your health care provider will ask about your medical and family history. You will have a physical exam, which may include: Measuring your height, weight, and body mass index (BMI). Checking your blood pressure. This will be done at every routine medical visit. Your target blood pressure may vary depending on your medical conditions, your age, and other factors. A thyroid exam. A skin exam. Screening for nerve damage (peripheral neuropathy). This may include checking the pulse in your legs and feet and the level of sensation in your hands and feet. A foot exam to inspect the structure and skin of your feet, including checking for cuts, bruises, redness, blisters, sores, or other problems. Screening for blood vessel (vascular) problems. This may include checking the pulse in your legs and feet and checking your temperature. Blood tests Depending on your treatment plan and your personal needs, you may have the following tests: Hemoglobin A1C (HbA1C). This test provides information about blood sugar (glucose) control over the previous 2-3 months. It is used to adjust your treatment plan, if needed. This test will be done: At least 2 times a year, if you are meeting your treatment goals. 4 times a year, if you are not meeting your treatment goals or if your goals have changed. Lipid testing, including total cholesterol, LDL and HDL cholesterol, and triglyceride levels. The goal for LDL is less than 100 mg/dL (5.5 mmol/L). If you are at high risk for complications, the goal is less than 70 mg/dL (3.9 mmol/L). The goal for HDL is 40 mg/dL (2.2 mmol/L) or higher for men, and 50 mg/dL (2.8 mmol/L) or higher for women. An HDL cholesterol of 60 mg/dL (3.3 mmol/L) or higher gives some protection  against heart disease. The goal for triglycerides is less than 150 mg/dL (8.3 mmol/L). Liver function tests. Kidney function tests. Thyroid function tests.  Dental and eye exams  Visit your dentist two times a year. If you have type 1 diabetes, your health care provider may recommend an eye exam within 5 years after you are diagnosed, and then once a year after your first exam. For children with type 1 diabetes, the health care provider may recommend an eye exam when your child is age 49 or older and has had diabetes for 3-5 years. After the first exam, your child should get an eye exam once a year. If you have type 2 diabetes, your health care provider may recommend an eye exam as soon as you are diagnosed, and then every 1-2 years after your first exam. Immunizations A yearly flu (influenza) vaccine is recommended annually for everyone 6 months or older. This is especially important if you have diabetes. The pneumonia (pneumococcal) vaccine is recommended for everyone 2 years or older who has diabetes. If you are age 72 or older, you may get the pneumonia vaccine as a  series of two separate shots. The hepatitis B vaccine is recommended for adults shortly after being diagnosed with diabetes. Adults and children with diabetes should receive all other vaccines according to age-specific recommendations from the Centers for Disease Control and Prevention (CDC). Mental and emotional health Screening for symptoms of eating disorders, anxiety, and depression is recommended at the time of diagnosis and after as needed. If your screening shows that you have symptoms, you may need more evaluation. You may work with a mental health care provider. Follow these instructions at home: Treatment plan You will monitor your blood glucose levels and may give yourself insulin. Your treatment plan will be reviewed at every medical visit. You and your health care provider will discuss: How you are taking your  medicines, including insulin. Any side effects you have. Your blood glucose level target goals. How often you monitor your blood glucose level. Lifestyle habits, such as activity level and tobacco, alcohol, and substance use. Education Your health care provider will assess how well you are monitoring your blood glucose levels and whether you are taking your insulin and medicines correctly. He or she may refer you to: A certified diabetes care and education specialist to manage your diabetes throughout your life, starting at diagnosis. A registered dietitian who can create and review your personal nutrition plan. An exercise specialist who can discuss your activity level and exercise plan. General instructions Take over-the-counter and prescription medicines only as told by your health care provider. Keep all follow-up visits. This is important. Where to find support There are many diabetes support networks, including: American Diabetes Association (ADA): diabetes.org Defeat Diabetes Foundation: defeatdiabetes.org Where to find more information American Diabetes Association (ADA): www.diabetes.org Association of Diabetes Care & Education Specialists (ADCES): diabeteseducator.org International Diabetes Federation (IDF): https://www.munoz-bell.org/ Summary Managing diabetes (diabetes mellitus) can be complicated. Your diabetes treatment may be managed by a team of health care providers. Your health care providers follow guidelines to help you get the best quality care. You should have physical exams, blood tests, blood pressure monitoring, immunizations, and screening tests regularly. Stay updated on how to manage your diabetes. Your health care providers may also give you more specific instructions based on your individual health. This information is not intended to replace advice given to you by your health care provider. Make sure you discuss any questions you have with your health care provider. Document  Revised: 05/30/2020 Document Reviewed: 05/30/2020 Elsevier Patient Education  [place].

## 2022-01-09 LAB — COMPREHENSIVE METABOLIC PANEL
ALT: 19 IU/L (ref 0–32)
AST: 22 IU/L (ref 0–40)
Albumin/Globulin Ratio: 1.5 (ref 1.2–2.2)
Albumin: 4.6 g/dL (ref 3.8–4.9)
Alkaline Phosphatase: 128 IU/L — ABNORMAL HIGH (ref 44–121)
BUN/Creatinine Ratio: 11 (ref 9–23)
BUN: 10 mg/dL (ref 6–24)
Bilirubin Total: 0.4 mg/dL (ref 0.0–1.2)
CO2: 25 mmol/L (ref 20–29)
Calcium: 9.5 mg/dL (ref 8.7–10.2)
Chloride: 96 mmol/L (ref 96–106)
Creatinine, Ser: 0.91 mg/dL (ref 0.57–1.00)
Globulin, Total: 3.1 g/dL (ref 1.5–4.5)
Glucose: 101 mg/dL — ABNORMAL HIGH (ref 70–99)
Potassium: 3.9 mmol/L (ref 3.5–5.2)
Sodium: 135 mmol/L (ref 134–144)
Total Protein: 7.7 g/dL (ref 6.0–8.5)
eGFR: 75 mL/min/{1.73_m2} (ref >59)

## 2022-01-09 LAB — LIPID PANEL
Chol/HDL Ratio: 8.5 ratio — ABNORMAL HIGH (ref 0.0–4.4)
Cholesterol, Total: 339 mg/dL — ABNORMAL HIGH (ref 100–199)
HDL: 40 mg/dL (ref >39)
LDL Chol Calc (NIH): 264 mg/dL — ABNORMAL HIGH (ref 0–99)
Triglycerides: 175 mg/dL — ABNORMAL HIGH (ref 0–149)
VLDL Cholesterol Cal: 35 mg/dL (ref 5–40)

## 2022-01-09 LAB — MICROALBUMIN / CREATININE URINE RATIO
Creatinine, Urine: 97.1 mg/dL
Microalb/Creat Ratio: 14 mg/g creat (ref 0–29)
Microalbumin, Urine: 13.3 ug/mL

## 2022-01-10 ENCOUNTER — Other Ambulatory Visit: Payer: Self-pay | Admitting: Internal Medicine

## 2022-01-10 DIAGNOSIS — E782 Mixed hyperlipidemia: Secondary | ICD-10-CM

## 2022-01-10 DIAGNOSIS — Z789 Other specified health status: Secondary | ICD-10-CM

## 2022-01-12 ENCOUNTER — Other Ambulatory Visit: Payer: Self-pay

## 2022-01-12 ENCOUNTER — Encounter (HOSPITAL_BASED_OUTPATIENT_CLINIC_OR_DEPARTMENT_OTHER): Payer: Self-pay | Admitting: Emergency Medicine

## 2022-01-12 ENCOUNTER — Emergency Department (HOSPITAL_BASED_OUTPATIENT_CLINIC_OR_DEPARTMENT_OTHER)
Admission: EM | Admit: 2022-01-12 | Discharge: 2022-01-12 | Disposition: A | Discharge status: Home / Self Care | Payer: Medicare Other | Attending: Emergency Medicine | Admitting: Emergency Medicine

## 2022-01-12 ENCOUNTER — Emergency Department (HOSPITAL_BASED_OUTPATIENT_CLINIC_OR_DEPARTMENT_OTHER): Payer: Medicare Other

## 2022-01-12 DIAGNOSIS — Z20822 Contact with and (suspected) exposure to covid-19: Secondary | ICD-10-CM | POA: Insufficient documentation

## 2022-01-12 DIAGNOSIS — R0789 Other chest pain: Secondary | ICD-10-CM | POA: Diagnosis not present

## 2022-01-12 DIAGNOSIS — I10 Essential (primary) hypertension: Secondary | ICD-10-CM | POA: Diagnosis not present

## 2022-01-12 DIAGNOSIS — Z96651 Presence of right artificial knee joint: Secondary | ICD-10-CM | POA: Insufficient documentation

## 2022-01-12 DIAGNOSIS — Z79899 Other long term (current) drug therapy: Secondary | ICD-10-CM | POA: Insufficient documentation

## 2022-01-12 DIAGNOSIS — Z96642 Presence of left artificial hip joint: Secondary | ICD-10-CM | POA: Diagnosis not present

## 2022-01-12 DIAGNOSIS — R519 Headache, unspecified: Secondary | ICD-10-CM | POA: Diagnosis not present

## 2022-01-12 DIAGNOSIS — R0602 Shortness of breath: Secondary | ICD-10-CM | POA: Diagnosis not present

## 2022-01-12 DIAGNOSIS — R059 Cough, unspecified: Secondary | ICD-10-CM | POA: Diagnosis not present

## 2022-01-12 DIAGNOSIS — F17210 Nicotine dependence, cigarettes, uncomplicated: Secondary | ICD-10-CM | POA: Diagnosis not present

## 2022-01-12 DIAGNOSIS — J101 Influenza due to other identified influenza virus with other respiratory manifestations: Secondary | ICD-10-CM | POA: Diagnosis not present

## 2022-01-12 LAB — RESP PANEL BY RT-PCR (FLU A&B, COVID) ARPGX2
Influenza A by PCR: POSITIVE — AB
Influenza B by PCR: NEGATIVE
SARS Coronavirus 2 by RT PCR: NEGATIVE

## 2022-01-12 IMAGING — CR DG CHEST 2V
2 series · 2 of 2 positions shown · non-contrast
Comparison: [DATE]

CLINICAL DATA: Cough, congestion, shortness of breath, chest
soreness and headache for 4 days.

EXAM:
CHEST - 2 VIEW

[w chest pa]
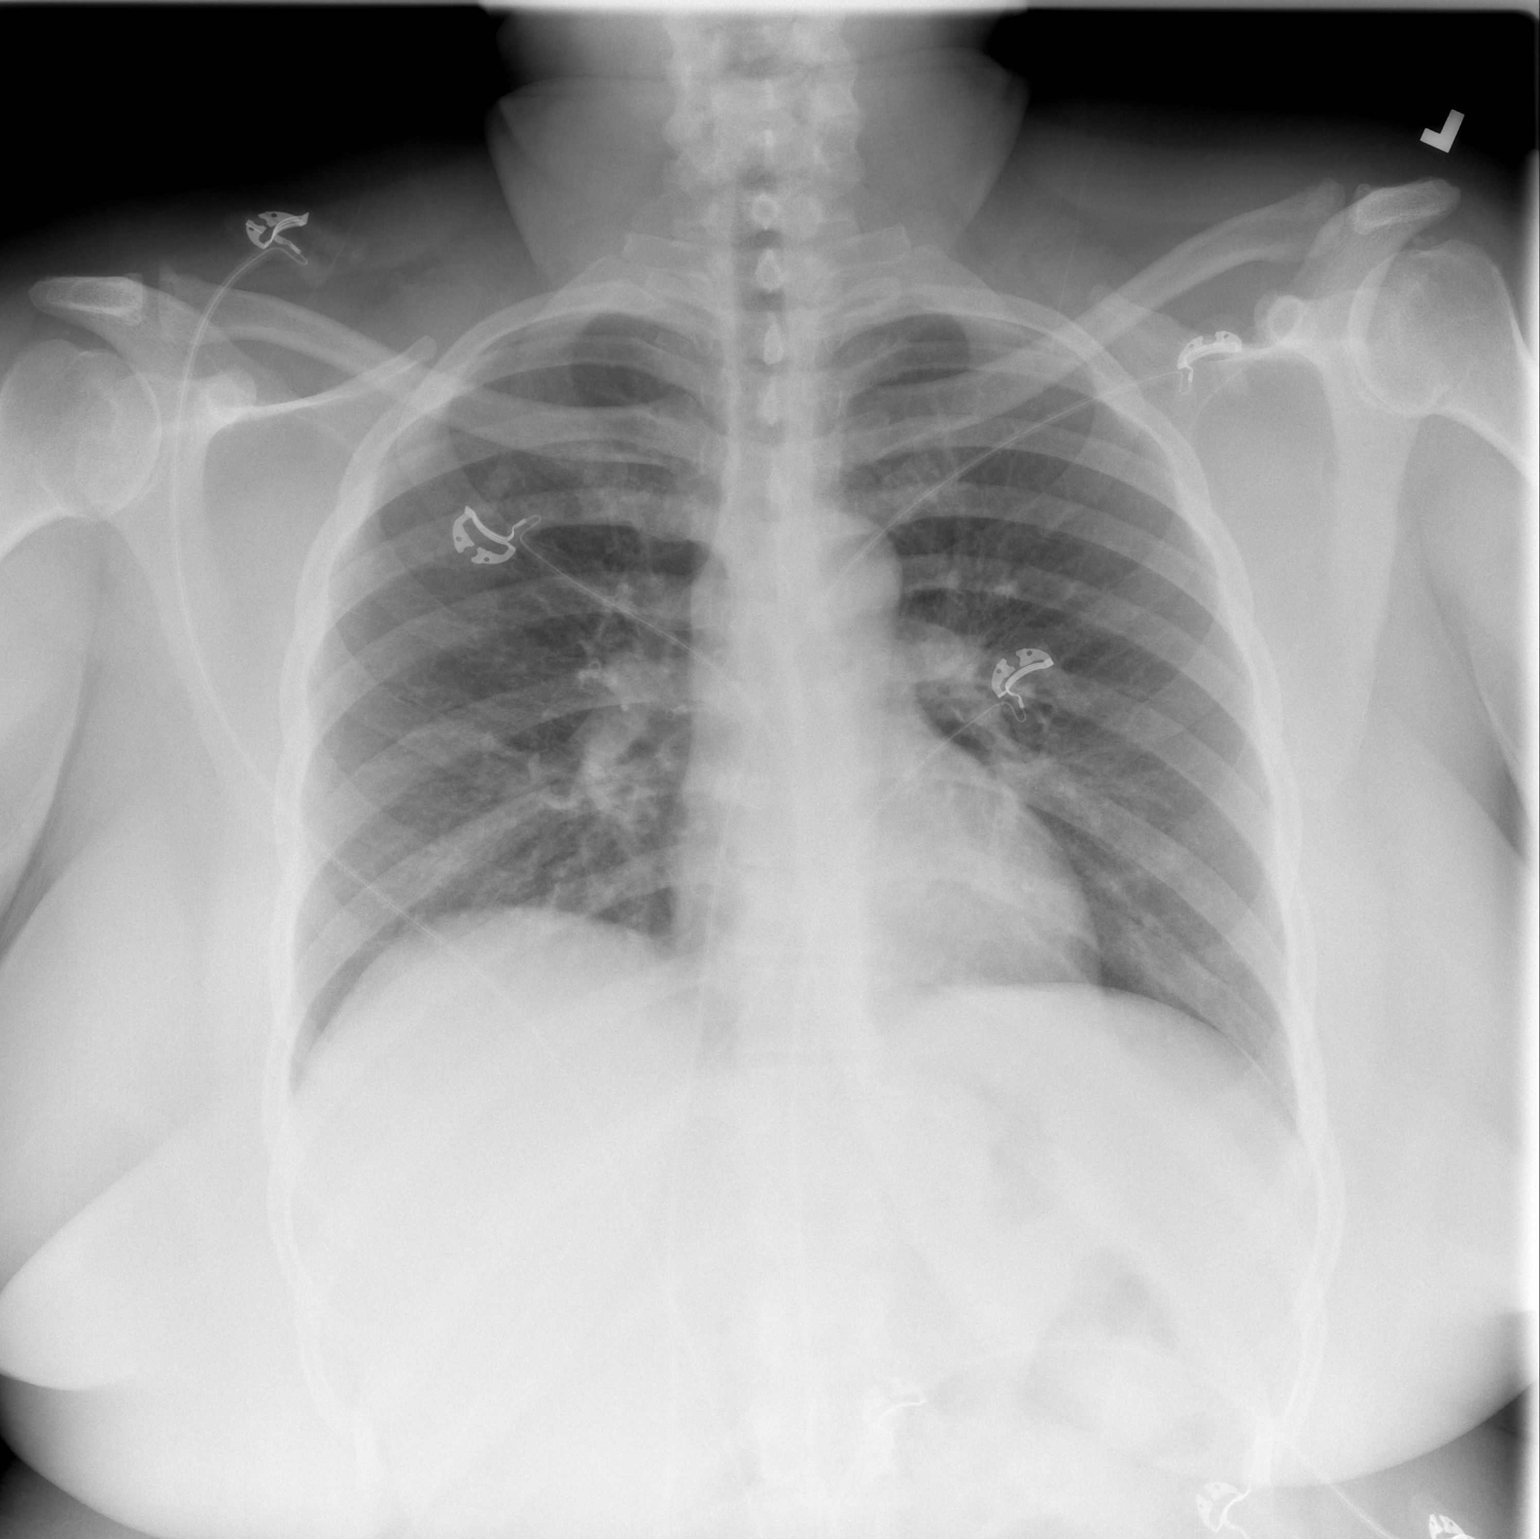

[w chest lat]
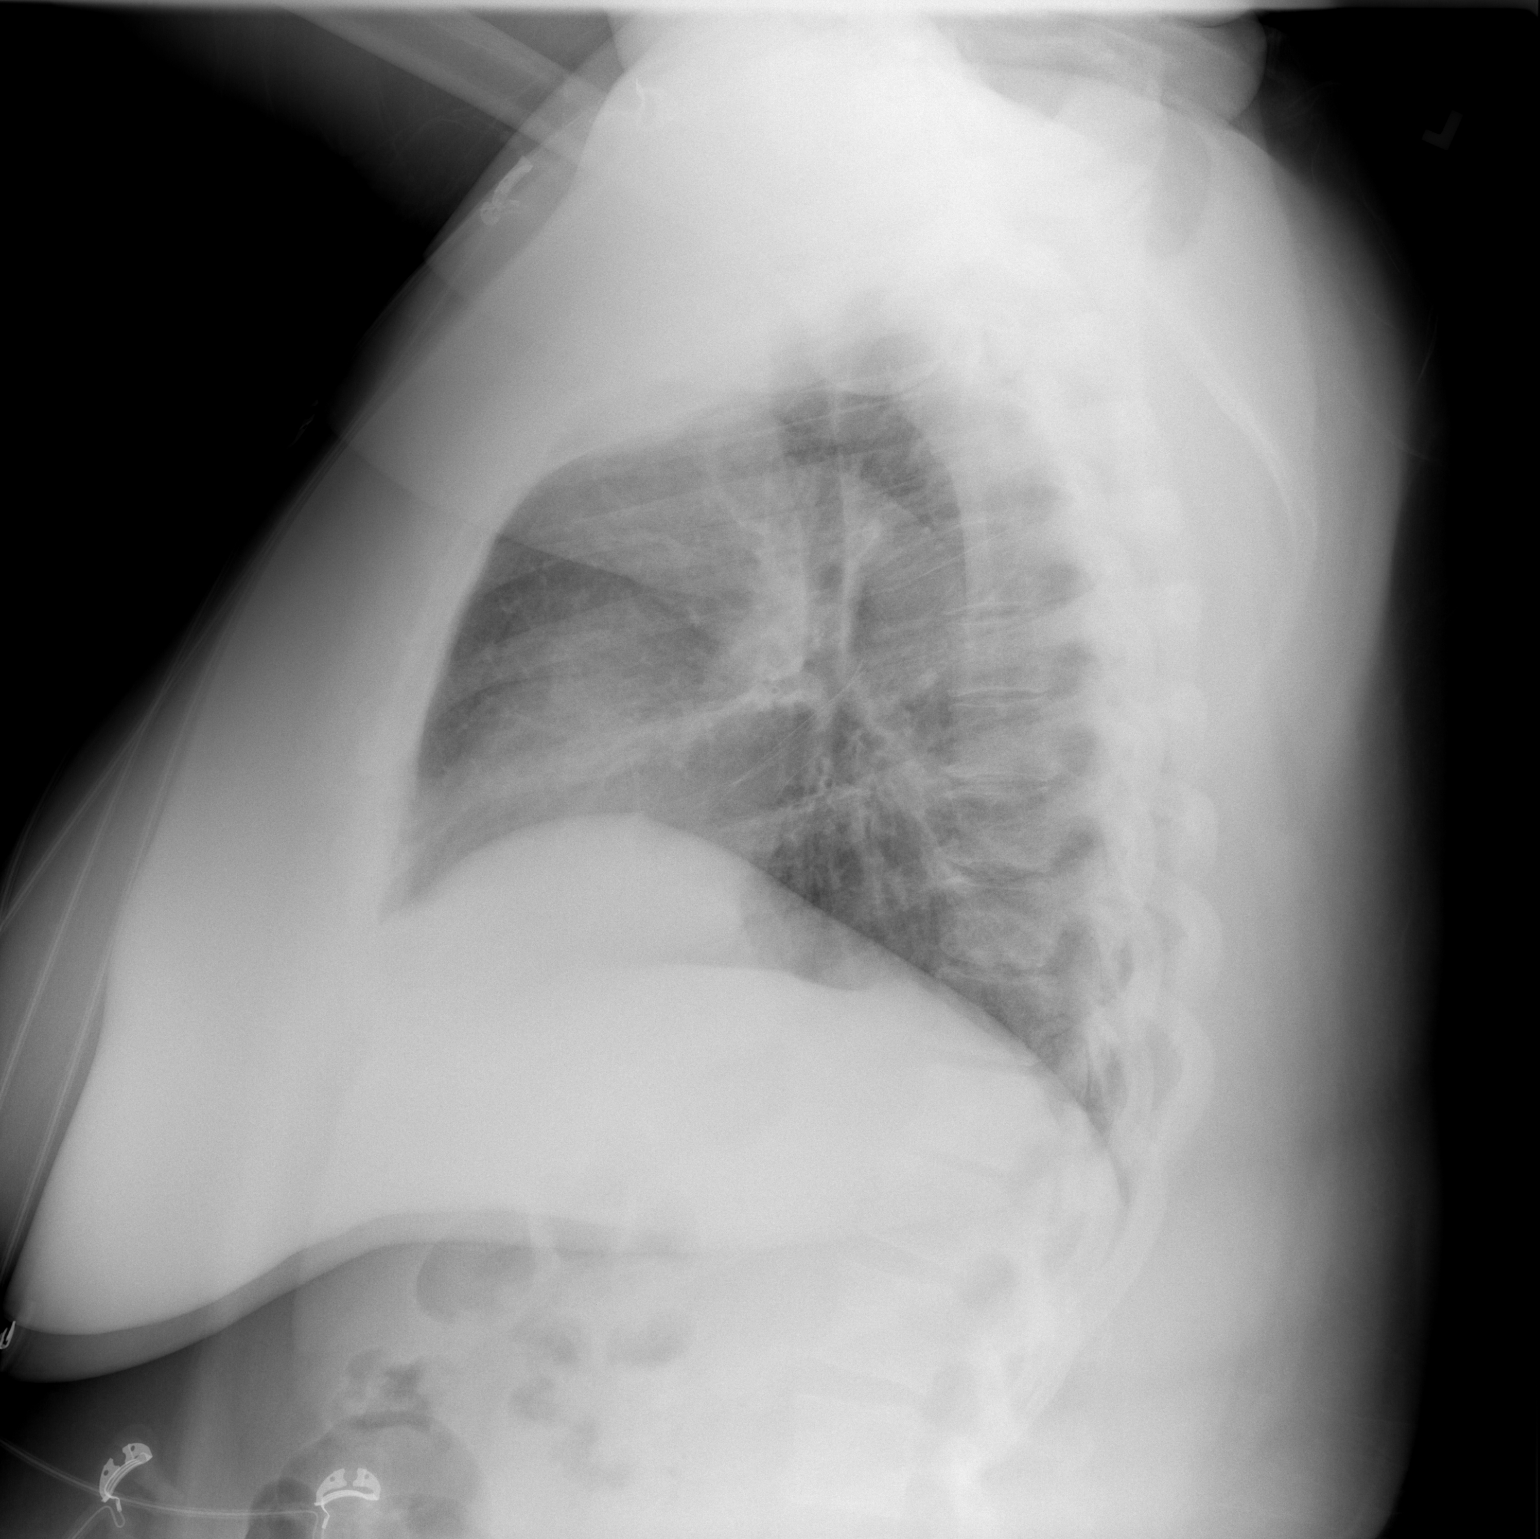

[2 of 2 positions shown; findings below may reference images not displayed]

FINDINGS: The heart size and mediastinal contours are within normal limits.
Both lungs are clear. The visualized skeletal structures are
unremarkable.
IMPRESSION: No active cardiopulmonary disease.

## 2022-01-12 MED ORDER — HYDROCOD POLI-CHLORPHE POLI ER 10-8 MG/5ML PO SUER: 5.0000 mL | Freq: Two times a day (BID) | ORAL | 0 refills | Status: DC | PRN

## 2022-01-12 MED ORDER — ACETAMINOPHEN 500 MG PO TABS
1000.0000 mg | ORAL_TABLET | Freq: Once | ORAL | Status: AC
  Administered 2022-01-12: 1000 mg via ORAL
  Filled 2022-01-12: qty 2

## 2022-01-12 MED ORDER — OSELTAMIVIR PHOSPHATE 75 MG PO CAPS: 75.0000 mg | ORAL_CAPSULE | Freq: Two times a day (BID) | ORAL | 0 refills | Status: DC

## 2022-01-12 MED ORDER — ALBUTEROL SULFATE HFA 108 (90 BASE) MCG/ACT IN AERS
1.0000 | INHALATION_SPRAY | RESPIRATORY_TRACT | Status: DC | PRN
  Administered 2022-01-12: 1 via RESPIRATORY_TRACT
  Filled 2022-01-12: qty 6.7

## 2022-01-12 NOTE — ED Triage Notes (Signed)
Reports cough, congestion since Thursday.  Now c/o chest tightness.  Taking mucinex at home with no relief.

## 2022-01-12 NOTE — ED Provider Notes (Signed)
[facility] Provider Note: Georgena Spurling, MD, FACEP  CSN: [id number] MRN: [id number] ARRIVAL: 01/12/22 at [place]: [place]  01/12/22 1:03 AM Sarah Randall is a 56 y.o. female with 4 days of URI symptoms.  Specifically she has had cough and nasal congestion.  She has been taking Mucinex without relief.  She is still having a sensation of tightness in her chest.  She describes the tightness is in her upper chest and feels like there is some phlegm that she needs to cough up.  She has had a decreased appetite and after trying to eat yesterday she actually induced vomiting to try to relieve that sense of tightness in her chest.  She has not had a fever.  She denies frank shortness of breath.   Past Medical History:  Diagnosis Date   Anemia    Anxiety    Arthritis    B12 deficiency 06/30/2019   Depression    GERD (gastroesophageal reflux disease)    occasionally takes OTC   History of leukocytosis    Hyperlipidemia    Hypertension    Low back pain    Obesity    Pain in right buttock    Goes down to Right leg to lateral aspect of calf.   Pre-diabetes    Sleep apnea    tested 2014 - unable to tolerate machine   Spondylolisthesis at L4-L5 level     Past Surgical History:  Procedure Laterality Date   ABDOMINAL EXPOSURE N/A 01/22/2021   Procedure: LATERAL EXPOSURE FOR OBLIQUE LATERAL INTERBODY FUSION;  Surgeon: Marty Heck, MD;  Location: [facility] OR;  Service: Vascular;  Laterality: N/A;   ABDOMINAL HYSTERECTOMY     ANTERIOR LUMBAR FUSION N/A 01/22/2021   Procedure: Oblique Lateral Interbody Fusion Lumbar Four-Lumbar Five;  Surgeon: Vallarie Mare, MD;  Location: [facility];  Service: Neurosurgery;  Laterality: N/A;   LEFT HEART CATH AND CORONARY ANGIOGRAPHY N/A 10/25/2018   Procedure: LEFT HEART CATH AND CORONARY ANGIOGRAPHY;  Surgeon: Martinique, Peter M, MD;  Location: [facility];  Service:  Cardiovascular;  Laterality: N/A;   LUMBAR PERCUTANEOUS PEDICLE SCREW 1 LEVEL N/A 01/22/2021   Procedure: LUMBAR PERCUTANEOUS PEDICLE SCREW LUBAR FOUR- FIVE;  Surgeon: Vallarie Mare, MD;  Location: [facility];  Service: Neurosurgery;  Laterality: N/A;   ROBOTIC ASSISTED LAPAROSCOPIC HYSTERECTOMY AND SALPINGECTOMY Bilateral 12/27/2019   Procedure: XI ROBOTIC ASSISTED TOTAL LAPAROSCOPIC HYSTERECTOMY AND SALPINGECTOMY;  Surgeon: Princess Bruins, MD;  Location: [facility];  Service: Gynecology;  Laterality: Bilateral;   TOTAL HIP ARTHROPLASTY Left 09/22/2017   Procedure: LEFT TOTAL HIP ARTHROPLASTY ANTERIOR APPROACH;  Surgeon: Leandrew Koyanagi, MD;  Location: [place];  Service: Orthopedics;  Laterality: Left;   TOTAL KNEE ARTHROPLASTY Right 01/12/2018   Procedure: RIGHT TOTAL KNEE ARTHROPLASTY;  Surgeon: Leandrew Koyanagi, MD;  Location: [place] [facility];  Service: Orthopedics;  Laterality: Right;   TUBAL LIGATION      Family History  Problem Relation Age of Onset   Hypertension Mother    Diabetes Mother    Breast cancer Mother 71   Hypertension Father    Lung cancer Maternal Aunt    Colon polyps Brother    Colon cancer Neg Hx    Esophageal cancer Neg Hx    Rectal cancer Neg Hx    Stomach cancer Neg Hx     Social History   Tobacco Use  Smoking status: Every Day    Packs/day: 0.25    Years: 30.00    Pack years: 7.50    Types: Cigarettes   Smokeless tobacco: Never  Vaping Use   Vaping Use: Never used  Substance Use Topics   Alcohol use: Yes    Alcohol/week: 4.0 - 6.0 standard drinks    Types: 4 - 6 Shots of liquor per week    Comment: occ   Drug use: Yes    Frequency: 2.0 times per week    Types: Marijuana    Prior to Admission medications   Medication Sig Start Date End Date Taking? Authorizing Provider  oseltamivir (TAMIFLU) 75 MG capsule Take 1 capsule (75 mg total) by mouth every 12 (twelve) hours. 01/12/22  Yes Trayveon Beckford, MD  Cyanocobalamin (B-12) 5000 MCG CAPS Take  5,000 mcg by mouth every other day.    [provider]  diclofenac Sodium (VOLTAREN) 1 % GEL Apply 2 g topically 4 (four) times daily. 01/08/22   Ladell Pier, MD  esomeprazole (NEXIUM) 40 MG capsule TAKE 1 CAPSULE BY MOUTH EVERY DAY 12/12/21   Ladell Pier, MD  fluticasone (FLONASE) 50 MCG/ACT nasal spray PLACE 2 SPRAYS INTO BOTH NOSTRILS DAILY AS NEEDED FOR ALLERGIES OR RHINITIS (NASAL CONGESTION). 12/02/21   Ladell Pier, MD  hydrochlorothiazide (HYDRODIURIL) 25 MG tablet TAKE 1 TABLET BY MOUTH EVERY DAY 01/01/22   Ladell Pier, MD  loratadine (CLARITIN) 10 MG tablet TAKE 1 TABLET BY MOUTH EVERY DAY AS NEEDED FOR ALLERGY 08/14/21   Ladell Pier, MD  losartan (COZAAR) 100 MG tablet Take 1 tablet (100 mg total) by mouth daily. 01/08/22   Ladell Pier, MD  metFORMIN (GLUCOPHAGE) 500 MG tablet Take 1 tablet (500 mg total) by mouth daily with breakfast. 01/08/22   Ladell Pier, MD  nitroGLYCERIN (NITROSTAT) 0.4 MG SL tablet Place 1 tablet (0.4 mg total) under the tongue every 5 (five) minutes as needed. 10/21/18 12/22/19  Revankar, Reita Cliche, MD  propranolol (INDERAL) 10 MG tablet TAKE 1 TABLET BY MOUTH TWICE A DAY 01/01/22   Ladell Pier, MD    Allergies Pregabalin, Cymbalta [duloxetine hcl], Escitalopram, Gabapentin, Lipitor [atorvastatin], Pravachol [pravastatin], and Zoloft [sertraline hcl]   REVIEW OF SYSTEMS  Negative except as noted here or in the History of Present Illness.   PHYSICAL EXAMINATION  Initial Vital Signs Blood pressure 99/69, pulse 94, resp. rate 15, height 5\' 4"  (1.626 m), weight 127.5 kg, SpO2 96 %.  Examination General: Well-developed, well-nourished female in no acute distress; appearance consistent with age of record HENT: normocephalic; atraumatic; no pharyngeal erythema or exudates; no stridor; no dysphonia Eyes: pupils equal, round and reactive to light; extraocular muscles intact Neck: supple Heart: regular rate and  rhythm Lungs: Mildly decreased air movement bilaterally; wheezy cough Abdomen: soft; nondistended; nontender; bowel sounds present Extremities: No deformity; full range of motion; pulses normal Neurologic: Awake, alert and oriented; motor function intact in all extremities and symmetric; no facial droop Skin: Warm and dry Psychiatric: Normal mood and affect   RESULTS  Summary of this visit's results, reviewed and interpreted by myself:   EKG Interpretation  Date/Time:  Monday January 12 2022 01:00:19 EST Ventricular Rate:  93 PR Interval:  142 QRS Duration: 86 QT Interval:  381 QTC Calculation: 474 R Axis:   47 Text Interpretation: Sinus rhythm Normal ECG No significant change was found Confirmed by Kenley Rettinger [phone]) on 01/12/2022 1:02:05 AM  Laboratory Studies: Results for orders placed or performed during the hospital encounter of 01/12/22 (from the past 24 hour(s))  Resp Panel by RT-PCR (Flu A&B, Covid) Nasopharyngeal Swab     Status: Abnormal   Collection Time: 01/12/22  1:04 AM   Specimen: Nasopharyngeal Swab; Nasopharyngeal(NP) swabs in vial transport medium  Result Value Ref Range   SARS Coronavirus 2 by RT PCR NEGATIVE NEGATIVE   Influenza A by PCR POSITIVE (A) NEGATIVE   Influenza B by PCR NEGATIVE NEGATIVE   Imaging Studies: DG Chest 2 View  Result Date: 01/12/2022 CLINICAL DATA:  Cough, congestion, shortness of breath, chest soreness and headache for 4 days. EXAM: CHEST - 2 VIEW COMPARISON:  09/06/2020 FINDINGS: The heart size and mediastinal contours are within normal limits. Both lungs are clear. The visualized skeletal structures are unremarkable. IMPRESSION: No active cardiopulmonary disease. Electronically Signed   By: Lucienne Capers M.D.   On: 01/12/2022 01:33    ED COURSE and MDM  Nursing notes, initial and subsequent vitals signs, including pulse oximetry, reviewed and interpreted by myself.  Vitals:   01/12/22 0057 01/12/22 0100 01/12/22 0102  01/12/22 0127  BP:  99/69    Pulse:   94   Resp:  16 15   Temp:  98 F (36.7 C)    TempSrc:  Oral    SpO2:   96% 98%  Weight: 127.5 kg     Height: 5\' 4"  (1.626 m)      Medications  albuterol (VENTOLIN HFA) 108 (90 Base) MCG/ACT inhaler 1-2 puff (1 puff Inhalation Given 01/12/22 0127)  acetaminophen (TYLENOL) tablet 1,000 mg (1,000 mg Oral Given 01/12/22 0149)   Patient given albuterol inhaler and instructed in its use.  She tested positive for influenza A.  We will give her the option of taking Tamiflu although its efficacy has come in to question recently.   PROCEDURES  Procedures   ED DIAGNOSES     ICD-10-CM   1. Influenza A  J10.1          Elian Gloster, MD 01/12/22 5621

## 2022-01-12 NOTE — ED Notes (Signed)
Patient transported to X-ray 

## 2022-01-26 NOTE — Progress Notes (Signed)
Established Patient Office Visit  Subjective:  Patient ID: Sarah Randall, female    DOB: [birth year]  Age: 56 y.o. MRN: [id number]  CC:  Chief Complaint  Patient presents with   Hand Pain   Ear Fullness    HPI Sarah Randall presents for acute work in visit for ear pain sinus pain cough postnasal drainage.  This is a primary care patient of Dr. Laural Benes.  Patient was just seen by Dr. Laural Benes February 2 and prior to this January 11 by nurse practitioner Tanda Rockers.  She had otitis media in January then saw her primary care doctor February 2 was complaining of some postnasal drainage then along with her other medical problems being managed including hypertension diabetes.  The patient then went to the ER February 6 with acute influenza A she was given Tamiflu which she is finished and cough syrup.  No antibiotics were issued.  Patient complains of stuffy nose sore throat postnasal drip ears congested she states the Flonase has not been of any benefit.  She also incidentally wants her right index finger looked at as she tends to lock the finger up in the mornings.  She has not been taking the metformin she is prescribed because the metformin causes GI side effects.  She has never tried the metformin XR version. In addition to all this she incidentally would also like a referral to pain management.  She has been seen by physical medicine and rehab and orthopedics for lumbar radiculopathy hip pain shoulder pain and right hand pain and elbow pain she would like a different approach.   Past Medical History:  Diagnosis Date   Anemia    Anxiety    Arthritis    B12 deficiency 06/30/2019   Depression    GERD (gastroesophageal reflux disease)    occasionally takes OTC   History of leukocytosis    Hyperlipidemia    Hypertension    Low back pain    Obesity    Pain in right buttock    Goes down to Right leg to lateral aspect of calf.   Pre-diabetes    Sleep apnea    tested 2014 - unable to  tolerate machine   Spondylolisthesis at L4-L5 level     Past Surgical History:  Procedure Laterality Date   ABDOMINAL EXPOSURE N/A 01/22/2021   Procedure: LATERAL EXPOSURE FOR OBLIQUE LATERAL INTERBODY FUSION;  Surgeon: Cephus Shelling, MD;  Location: [facility] OR;  Service: Vascular;  Laterality: N/A;   ABDOMINAL HYSTERECTOMY     ANTERIOR LUMBAR FUSION N/A 01/22/2021   Procedure: Oblique Lateral Interbody Fusion Lumbar Four-Lumbar Five;  Surgeon: Bedelia Person, MD;  Location: [facility] OR;  Service: Neurosurgery;  Laterality: N/A;   LEFT HEART CATH AND CORONARY ANGIOGRAPHY N/A 10/25/2018   Procedure: LEFT HEART CATH AND CORONARY ANGIOGRAPHY;  Surgeon: Swaziland, Peter M, MD;  Location: [facility] INVASIVE CV LAB;  Service: Cardiovascular;  Laterality: N/A;   LUMBAR PERCUTANEOUS PEDICLE SCREW 1 LEVEL N/A 01/22/2021   Procedure: LUMBAR PERCUTANEOUS PEDICLE SCREW LUBAR FOUR- FIVE;  Surgeon: Bedelia Person, MD;  Location: [facility] OR;  Service: Neurosurgery;  Laterality: N/A;   ROBOTIC ASSISTED LAPAROSCOPIC HYSTERECTOMY AND SALPINGECTOMY Bilateral 12/27/2019   Procedure: XI ROBOTIC ASSISTED TOTAL LAPAROSCOPIC HYSTERECTOMY AND SALPINGECTOMY;  Surgeon: Genia Del, MD;  Location: [facility];  Service: Gynecology;  Laterality: Bilateral;   TOTAL HIP ARTHROPLASTY Left 09/22/2017   Procedure: LEFT TOTAL HIP ARTHROPLASTY ANTERIOR APPROACH;  Surgeon: Tarry Kos, MD;  Location:  [place] OR;  Service: Orthopedics;  Laterality: Left;   TOTAL KNEE ARTHROPLASTY Right 01/12/2018   Procedure: RIGHT TOTAL KNEE ARTHROPLASTY;  Surgeon: Leandrew Koyanagi, MD;  Location: [place];  Service: Orthopedics;  Laterality: Right;   TUBAL LIGATION      Family History  Problem Relation Age of Onset   Hypertension Mother    Diabetes Mother    Breast cancer Mother 4   Hypertension Father    Lung cancer Maternal Aunt    Colon polyps Brother    Colon cancer Neg Hx    Esophageal cancer Neg Hx    Rectal cancer Neg Hx     Stomach cancer Neg Hx     Social History   Socioeconomic History   Marital status: Single    Spouse name: Not on file   Number of children: 2   Years of education: 9   Highest education level: Not on file  Occupational History   Occupation: unemployed  Tobacco Use   Smoking status: Every Day    Packs/day: 0.25    Years: 30.00    Pack years: 7.50    Types: Cigarettes   Smokeless tobacco: Never  Vaping Use   Vaping Use: Never used  Substance and Sexual Activity   Alcohol use: Yes    Alcohol/week: 4.0 - 6.0 standard drinks    Types: 4 - 6 Shots of liquor per week    Comment: occ   Drug use: Yes    Frequency: 2.0 times per week    Types: Marijuana   Sexual activity: Yes    Birth control/protection: None    Comment: declined insurance questions,des neg  Other Topics Concern   Not on file  Social History Narrative   Not on file   Social Determinants of Health   Financial Resource Strain: Not on file  Food Insecurity: Not on file  Transportation Needs: Not on file  Physical Activity: Not on file  Stress: Not on file  Social Connections: Not on file  Intimate Partner Violence: Not on file    Outpatient Medications Prior to Visit  Medication Sig Dispense Refill   Cyanocobalamin (B-12) 5000 MCG CAPS Take 5,000 mcg by mouth every other day.     esomeprazole (NEXIUM) 40 MG capsule TAKE 1 CAPSULE BY MOUTH EVERY DAY 90 capsule 1   hydrochlorothiazide (HYDRODIURIL) 25 MG tablet TAKE 1 TABLET BY MOUTH EVERY DAY 90 tablet 0   loratadine (CLARITIN) 10 MG tablet TAKE 1 TABLET BY MOUTH EVERY DAY AS NEEDED FOR ALLERGY 90 tablet 1   losartan (COZAAR) 100 MG tablet Take 1 tablet (100 mg total) by mouth daily. 30 tablet 6   propranolol (INDERAL) 10 MG tablet TAKE 1 TABLET BY MOUTH TWICE A DAY 180 tablet 0   fluticasone (FLONASE) 50 MCG/ACT nasal spray PLACE 2 SPRAYS INTO BOTH NOSTRILS DAILY AS NEEDED FOR ALLERGIES OR RHINITIS (NASAL CONGESTION). 16 mL 1   diclofenac Sodium  (VOLTAREN) 1 % GEL Apply 2 g topically 4 (four) times daily. (Patient not taking: Reported on 01/27/2022) 100 g 1   nitroGLYCERIN (NITROSTAT) 0.4 MG SL tablet Place 1 tablet (0.4 mg total) under the tongue every 5 (five) minutes as needed. 25 tablet 11   chlorpheniramine-HYDROcodone (TUSSIONEX PENNKINETIC ER) 10-8 MG/5ML Take 5 mLs by mouth every 12 (twelve) hours as needed. (Patient not taking: Reported on 01/27/2022) 70 mL 0   metFORMIN (GLUCOPHAGE) 500 MG tablet Take 1 tablet (500 mg total) by mouth daily with breakfast. (Patient not  taking: Reported on 01/27/2022) 30 tablet 5   oseltamivir (TAMIFLU) 75 MG capsule Take 1 capsule (75 mg total) by mouth every 12 (twelve) hours. (Patient not taking: Reported on 01/27/2022) 10 capsule 0   No facility-administered medications prior to visit.    Allergies  Allergen Reactions   Pregabalin Swelling   Cymbalta [Duloxetine Hcl] Nausea Only   Escitalopram     Made her feel out of it.   Gabapentin Swelling   Lipitor [Atorvastatin] Other (See Comments)    Stabbing pains in legs   Pravachol [Pravastatin]     Sharp pains in LT leg   Zoloft [Sertraline Hcl] Other (See Comments)    tremors    ROS Review of Systems  Constitutional:  Positive for fatigue. Negative for fever.  HENT:  Positive for congestion, postnasal drip, rhinorrhea, sinus pressure, sinus pain and sore throat. Negative for dental problem, drooling, ear discharge, ear pain, facial swelling, hearing loss, mouth sores, nosebleeds, sneezing, tinnitus, trouble swallowing and voice change.   Eyes: Negative.   Respiratory:  Positive for cough and shortness of breath. Negative for apnea, choking, chest tightness, wheezing and stridor.        Cough is dry  Cardiovascular: Negative.  Negative for chest pain, palpitations and leg swelling.  Gastrointestinal:  Positive for diarrhea and nausea. Negative for abdominal distention, abdominal pain and vomiting.       Metformin causes nausea and  diarrhea  Genitourinary: Negative.   Musculoskeletal:  Positive for back pain and myalgias. Negative for arthralgias.       Bilateral hip pain right shoulder pain right elbow pain  Skin: Negative.  Negative for rash.  Allergic/Immunologic: Negative.  Negative for environmental allergies and food allergies.  Neurological: Negative.  Negative for dizziness, syncope, weakness and headaches.  Hematological: Negative.  Negative for adenopathy. Does not bruise/bleed easily.  Psychiatric/Behavioral: Negative.  Negative for agitation and sleep disturbance. The patient is not nervous/anxious.      Objective:    Physical Exam Vitals reviewed.  Constitutional:      Appearance: Normal appearance. She is well-developed. She is obese. She is not diaphoretic.  HENT:     Head: Normocephalic and atraumatic.     Right Ear: External ear normal. There is impacted cerumen.     Left Ear: Ear canal and external ear normal. There is no impacted cerumen.     Ears:     Comments: Left tympanic membrane is not bulging but is filled with fluid behind the membrane  Partial occlusion with cerumen in the right ear    Nose: Congestion and rhinorrhea present. No nasal deformity, septal deviation or mucosal edema.     Right Sinus: No maxillary sinus tenderness or frontal sinus tenderness.     Left Sinus: No maxillary sinus tenderness or frontal sinus tenderness.     Comments: Bilateral nasal purulence and sinus tenderness in both frontal sinuses    Mouth/Throat:     Mouth: Mucous membranes are moist.     Pharynx: Posterior oropharyngeal erythema present. No oropharyngeal exudate.     Comments: Posterior oropharynx erythematous without purulence tonsils enlarged Eyes:     General: No scleral icterus.    Conjunctiva/sclera: Conjunctivae normal.     Pupils: Pupils are equal, round, and reactive to light.  Neck:     Thyroid: No thyromegaly.     Vascular: No carotid bruit or JVD.     Trachea: Trachea normal. No  tracheal tenderness or tracheal deviation.  Cardiovascular:  Rate and Rhythm: Normal rate and regular rhythm.     Chest Wall: PMI is not displaced.     Pulses: Normal pulses. No decreased pulses.     Heart sounds: Normal heart sounds, S1 normal and S2 normal. Heart sounds not distant. No murmur heard. No systolic murmur is present.  No diastolic murmur is present.    No friction rub. No gallop. No S3 or S4 sounds.  Pulmonary:     Effort: Pulmonary effort is normal. No tachypnea, accessory muscle usage or respiratory distress.     Breath sounds: Normal breath sounds. No stridor. No decreased breath sounds, wheezing, rhonchi or rales.  Chest:     Chest wall: No tenderness.  Abdominal:     General: Bowel sounds are normal. There is no distension.     Palpations: Abdomen is soft. Abdomen is not rigid.     Tenderness: There is no abdominal tenderness. There is no guarding or rebound.  Musculoskeletal:        General: Normal range of motion.     Cervical back: Normal range of motion and neck supple. No edema, erythema or rigidity. No muscular tenderness. Normal range of motion.     Comments: Right index finger moves normally at this point there is some tenderness at the base of the index finger to palpation  Lymphadenopathy:     Head:     Right side of head: No submental or submandibular adenopathy.     Left side of head: No submental or submandibular adenopathy.     Cervical: No cervical adenopathy.  Skin:    General: Skin is warm and dry.     Coloration: Skin is not pale.     Findings: No rash.     Nails: There is no clubbing.  Neurological:     General: No focal deficit present.     Mental Status: She is alert and oriented to person, place, and time. Mental status is at baseline.     Sensory: No sensory deficit.  Psychiatric:        Mood and Affect: Mood normal.        Speech: Speech normal.        Behavior: Behavior normal.        Thought Content: Thought content normal.     BP 119/87    Pulse 78    Wt 279 lb 12.8 oz (126.9 kg)    LMP  (LMP Unknown)    SpO2 95%    BMI 48.03 kg/m  Wt Readings from Last 3 Encounters:  01/27/22 279 lb 12.8 oz (126.9 kg)  01/12/22 281 lb (127.5 kg)  01/08/22 281 lb 3.2 oz (127.6 kg)     Health Maintenance Due  Topic Date Due   FOOT EXAM  Never done   OPHTHALMOLOGY EXAM  Never done   Zoster Vaccines- Shingrix (1 of 2) Never done   COVID-19 Vaccine (3 - Booster for Pfizer series) 05/06/2020    There are no preventive care reminders to display for this patient.  Lab Results  Component Value Date   TSH 1.40 05/26/2019   Lab Results  Component Value Date   WBC 13.2 (H) 05/06/2021   HGB 15.3 (H) 05/06/2021   HCT 46.0 05/06/2021   MCV 91.3 05/06/2021   PLT 437 (H) 05/06/2021   Lab Results  Component Value Date   NA 135 01/08/2022   K 3.9 01/08/2022   CO2 25 01/08/2022   GLUCOSE 101 (H) 01/08/2022   BUN  10 01/08/2022   CREATININE 0.91 01/08/2022   BILITOT 0.4 01/08/2022   ALKPHOS 128 (H) 01/08/2022   AST 22 01/08/2022   ALT 19 01/08/2022   PROT 7.7 01/08/2022   ALBUMIN 4.6 01/08/2022   CALCIUM 9.5 01/08/2022   ANIONGAP 10 05/06/2021   EGFR 75 01/08/2022   Lab Results  Component Value Date   CHOL 339 (H) 01/08/2022   Lab Results  Component Value Date   HDL 40 01/08/2022   Lab Results  Component Value Date   LDLCALC 264 (H) 01/08/2022   Lab Results  Component Value Date   TRIG 175 (H) 01/08/2022   Lab Results  Component Value Date   CHOLHDL 8.5 (H) 01/08/2022   Lab Results  Component Value Date   HGBA1C 6.7 01/08/2022      Assessment & Plan:   Problem List Items Addressed This Visit       Respiratory   Recurrent sinusitis    Recurrent sinusitis involving both frontal sinuses with purulence seen in the nares I think Flonase may be aggravating this along with Claritin  Plan to begin Augmentin twice daily for 7 days and discontinue Flonase and Claritin begin simple saline nasal  spray for rinse  Patient to obtain Mucinex 1200 mg twice daily over-the-counter for at least 7 to 10 days  Patient will obtain simple saline over-the-counter  Recommend she get Debrox over-the-counter to clear the wax out of the right ear      Relevant Medications   amoxicillin-clavulanate (AUGMENTIN) 875-125 MG tablet     Endocrine   Type 2 diabetes mellitus with obesity (HCC)    Suggest we try metformin XR once daily 500 mg in place of standard metformin to see if she will have better tolerance      Relevant Medications   metFORMIN (GLUCOPHAGE-XR) 500 MG 24 hr tablet     Nervous and Auditory   Radiculopathy, lumbar region   Relevant Orders   Ambulatory referral to Pain Clinic   Chronic right-sided low back pain with right-sided sciatica    Chronic recurrent low back pain right shoulder pain right elbow pain has been seen by sports medicine orthopedics and physical medicine and rehab without relief  Patient is wishing to see a pain specialist I did refer to [facility] medical      Acute otitis media    Residual otitis media in the left ear is improving should be treated with the Augmentin we prescribed  I had wanted to give steroids but was decided not to with her diabetes      Relevant Medications   amoxicillin-clavulanate (AUGMENTIN) 875-125 MG tablet     Musculoskeletal and Integument   Trochanteric bursitis of left hip   Relevant Orders   Ambulatory referral to Pain Clinic   Trigger index finger of right hand    Patient's had trigger finger of the left thumb previously she now appears to have it of the right hand probably related to her diabetes  ReferBack to orthopedics made      Relevant Orders   Ambulatory referral to Orthopedic Surgery   Other Visit Diagnoses     Type 2 diabetes mellitus with morbid obesity (HCC)    -  Primary   Relevant Medications   metFORMIN (GLUCOPHAGE-XR) 500 MG 24 hr tablet   Other Relevant Orders   POCT glucose (manual entry)  (Completed)       Meds ordered this encounter  Medications   amoxicillin-clavulanate (AUGMENTIN) 875-125 MG tablet  Sig: Take 1 tablet by mouth 2 (two) times daily.    Dispense:  14 tablet    Refill:  0   metFORMIN (GLUCOPHAGE-XR) 500 MG 24 hr tablet    Sig: Take 1 tablet (500 mg total) by mouth daily with breakfast.    Dispense:  60 tablet    Refill:  2    Stop regular metformin   38 minutes spent obtaining history and physical doing the complete medication reconciliation educating patient as to the diagnosis of trigger finger and her current ENT condition and need for this patient to follow-up with her primary care provider after current acute care given and multiple referral sent Follow-up: Keep planned follow-up with a primary care   Asencion Noble, MD

## 2022-01-27 ENCOUNTER — Encounter: Payer: Self-pay | Admitting: Critical Care Medicine

## 2022-01-27 ENCOUNTER — Other Ambulatory Visit: Payer: Self-pay

## 2022-01-27 ENCOUNTER — Ambulatory Visit: Payer: Medicare Other | Attending: Critical Care Medicine | Admitting: Critical Care Medicine

## 2022-01-27 DIAGNOSIS — G8929 Other chronic pain: Secondary | ICD-10-CM | POA: Diagnosis not present

## 2022-01-27 DIAGNOSIS — J329 Chronic sinusitis, unspecified: Secondary | ICD-10-CM | POA: Insufficient documentation

## 2022-01-27 DIAGNOSIS — E669 Obesity, unspecified: Secondary | ICD-10-CM

## 2022-01-27 DIAGNOSIS — M65321 Trigger finger, right index finger: Secondary | ICD-10-CM

## 2022-01-27 DIAGNOSIS — E1169 Type 2 diabetes mellitus with other specified complication: Secondary | ICD-10-CM

## 2022-01-27 DIAGNOSIS — M5416 Radiculopathy, lumbar region: Secondary | ICD-10-CM | POA: Diagnosis not present

## 2022-01-27 DIAGNOSIS — M5441 Lumbago with sciatica, right side: Secondary | ICD-10-CM | POA: Diagnosis not present

## 2022-01-27 DIAGNOSIS — Z6841 Body Mass Index (BMI) 40.0 and over, adult: Secondary | ICD-10-CM

## 2022-01-27 DIAGNOSIS — H6690 Otitis media, unspecified, unspecified ear: Secondary | ICD-10-CM | POA: Diagnosis not present

## 2022-01-27 DIAGNOSIS — M7062 Trochanteric bursitis, left hip: Secondary | ICD-10-CM

## 2022-01-27 LAB — GLUCOSE, POCT (MANUAL RESULT ENTRY): POC Glucose: 135 mg/dl — AB (ref 70–99)

## 2022-01-27 MED ORDER — METFORMIN HCL ER 500 MG PO TB24: 500.0000 mg | ORAL_TABLET | Freq: Every day | ORAL | 2 refills | Status: DC

## 2022-01-27 MED ORDER — AMOXICILLIN-POT CLAVULANATE 875-125 MG PO TABS
1.0000 | ORAL_TABLET | Freq: Two times a day (BID) | ORAL | 0 refills | Status: DC
Start: 2022-01-27 — End: 2022-07-17

## 2022-01-27 NOTE — Assessment & Plan Note (Signed)
Suggest we try metformin XR once daily 500 mg in place of standard metformin to see if she will have better tolerance

## 2022-01-27 NOTE — Patient Instructions (Signed)
Get debrox ear drops for the Right ear once daily to remove wax  over the counter  Get plain mucinex 1200mg  twice daily , over the counter  Get saline nasal spray three sprays ea nostril three times a day for a week   Take augmentin twice a day for sinus infection  Stop flonase  Stop claritin  I sent long acting metformin to try one daily for diabetes XR is gentler on stomach  A referral to Dr Erlinda Hong for trigger finger sent  A referral to pain clinic [facility] sent  Keep follow up appts with your PCP DR Wynetta Emery

## 2022-01-27 NOTE — Assessment & Plan Note (Signed)
Residual otitis media in the left ear is improving should be treated with the Augmentin we prescribed  I had wanted to give steroids but was decided not to with her diabetes

## 2022-01-27 NOTE — Assessment & Plan Note (Signed)
Recurrent sinusitis involving both frontal sinuses with purulence seen in the nares I think Flonase may be aggravating this along with Claritin  Plan to begin Augmentin twice daily for 7 days and discontinue Flonase and Claritin begin simple saline nasal spray for rinse  Patient to obtain Mucinex 1200 mg twice daily over-the-counter for at least 7 to 10 days  Patient will obtain simple saline over-the-counter  Recommend she get Debrox over-the-counter to clear the wax out of the right ear

## 2022-01-27 NOTE — Assessment & Plan Note (Signed)
Chronic recurrent low back pain right shoulder pain right elbow pain has been seen by sports medicine orthopedics and physical medicine and rehab without relief  Patient is wishing to see a pain specialist I did refer to [place]

## 2022-01-27 NOTE — Assessment & Plan Note (Addendum)
Patient's had trigger finger of the left thumb previously she now appears to have it of the right hand probably related to her diabetes  ReferBack to orthopedics made

## 2022-02-03 ENCOUNTER — Ambulatory Visit: Payer: Medicare Other | Admitting: Orthopaedic Surgery

## 2022-02-04 DIAGNOSIS — M7062 Trochanteric bursitis, left hip: Secondary | ICD-10-CM | POA: Diagnosis not present

## 2022-02-04 DIAGNOSIS — E1169 Type 2 diabetes mellitus with other specified complication: Secondary | ICD-10-CM | POA: Diagnosis not present

## 2022-02-04 DIAGNOSIS — Z79899 Other long term (current) drug therapy: Secondary | ICD-10-CM | POA: Diagnosis not present

## 2022-02-04 DIAGNOSIS — R030 Elevated blood-pressure reading, without diagnosis of hypertension: Secondary | ICD-10-CM | POA: Diagnosis not present

## 2022-02-04 DIAGNOSIS — G8929 Other chronic pain: Secondary | ICD-10-CM | POA: Diagnosis not present

## 2022-02-04 DIAGNOSIS — M129 Arthropathy, unspecified: Secondary | ICD-10-CM | POA: Diagnosis not present

## 2022-02-04 DIAGNOSIS — M5441 Lumbago with sciatica, right side: Secondary | ICD-10-CM | POA: Diagnosis not present

## 2022-02-11 DIAGNOSIS — M7062 Trochanteric bursitis, left hip: Secondary | ICD-10-CM | POA: Diagnosis not present

## 2022-02-11 DIAGNOSIS — Z79899 Other long term (current) drug therapy: Secondary | ICD-10-CM | POA: Diagnosis not present

## 2022-02-11 DIAGNOSIS — M159 Polyosteoarthritis, unspecified: Secondary | ICD-10-CM | POA: Diagnosis not present

## 2022-02-11 DIAGNOSIS — M5441 Lumbago with sciatica, right side: Secondary | ICD-10-CM | POA: Diagnosis not present

## 2022-02-11 DIAGNOSIS — G8929 Other chronic pain: Secondary | ICD-10-CM | POA: Diagnosis not present

## 2022-02-13 DIAGNOSIS — Z79899 Other long term (current) drug therapy: Secondary | ICD-10-CM | POA: Diagnosis not present

## 2022-02-26 ENCOUNTER — Other Ambulatory Visit: Payer: Self-pay | Admitting: Internal Medicine

## 2022-02-26 DIAGNOSIS — F32A Depression, unspecified: Secondary | ICD-10-CM

## 2022-02-26 DIAGNOSIS — F419 Anxiety disorder, unspecified: Secondary | ICD-10-CM

## 2022-02-27 NOTE — Telephone Encounter (Signed)
Requested medication (s) are due for refill today:   No ? ?Requested medication (s) are on the active medication list:   No ? ?Future visit scheduled:   Yes ? ? ?Last ordered: Discontinued 01/08/2022 by Dr. Wynetta Emery ? ?Returned because got a refill request.    ? ?Requested Prescriptions  ?Pending Prescriptions Disp Refills  ? escitalopram (LEXAPRO) 10 MG tablet [Pharmacy Med Name: ESCITALOPRAM 10 MG TABLET] 90 tablet 1  ?  Sig: TAKE 1 TABLET BY MOUTH EVERY DAY  ?  ? Psychiatry:  Antidepressants - SSRI Passed - 02/26/2022  1:42 AM  ?  ?  Passed - Completed PHQ-2 or PHQ-9 in the last 360 days  ?  ?  Passed - Valid encounter within last 6 months  ?  Recent Outpatient Visits   ? ?      ? 1 month ago Type 2 diabetes mellitus with morbid obesity ([place])  ? [place] Elsie Stain, MD  ? 1 month ago Type 2 diabetes mellitus with morbid obesity ([place])  ? [place] Karle Plumber B, MD  ? 2 months ago Acute otitis media, unspecified otitis media type  ? [facility] Care at [facility], Kriste Basque, NP  ? 6 months ago Stressful life event affecting family  ? [place] Ladell Pier, MD  ? 11 months ago Essential hypertension  ? [place] Ladell Pier, MD  ? ?  ?  ?Future Appointments   ? ?        ? In 2 months Ladell Pier, MD [place]  ? In 4 months Hilty, Nadean Corwin, MD [facility]  ? ?  ? ?  ?  ?  ? ?

## 2022-03-08 ENCOUNTER — Emergency Department (HOSPITAL_BASED_OUTPATIENT_CLINIC_OR_DEPARTMENT_OTHER): Payer: Medicare Other

## 2022-03-08 ENCOUNTER — Other Ambulatory Visit: Payer: Self-pay

## 2022-03-08 ENCOUNTER — Encounter (HOSPITAL_BASED_OUTPATIENT_CLINIC_OR_DEPARTMENT_OTHER): Payer: Self-pay | Admitting: Emergency Medicine

## 2022-03-08 ENCOUNTER — Emergency Department (HOSPITAL_BASED_OUTPATIENT_CLINIC_OR_DEPARTMENT_OTHER)
Admission: EM | Admit: 2022-03-08 | Discharge: 2022-03-08 | Disposition: A | Discharge status: Home / Self Care | Payer: Medicare Other | Attending: Emergency Medicine | Admitting: Emergency Medicine

## 2022-03-08 DIAGNOSIS — R109 Unspecified abdominal pain: Secondary | ICD-10-CM | POA: Diagnosis not present

## 2022-03-08 DIAGNOSIS — Z79899 Other long term (current) drug therapy: Secondary | ICD-10-CM | POA: Insufficient documentation

## 2022-03-08 DIAGNOSIS — R7303 Prediabetes: Secondary | ICD-10-CM | POA: Insufficient documentation

## 2022-03-08 DIAGNOSIS — S3992XA Unspecified injury of lower back, initial encounter: Secondary | ICD-10-CM | POA: Diagnosis present

## 2022-03-08 DIAGNOSIS — S39012A Strain of muscle, fascia and tendon of lower back, initial encounter: Secondary | ICD-10-CM | POA: Insufficient documentation

## 2022-03-08 DIAGNOSIS — R0789 Other chest pain: Secondary | ICD-10-CM | POA: Diagnosis not present

## 2022-03-08 DIAGNOSIS — I10 Essential (primary) hypertension: Secondary | ICD-10-CM | POA: Insufficient documentation

## 2022-03-08 DIAGNOSIS — M545 Low back pain, unspecified: Secondary | ICD-10-CM | POA: Diagnosis not present

## 2022-03-08 DIAGNOSIS — X58XXXA Exposure to other specified factors, initial encounter: Secondary | ICD-10-CM | POA: Insufficient documentation

## 2022-03-08 DIAGNOSIS — Z7984 Long term (current) use of oral hypoglycemic drugs: Secondary | ICD-10-CM | POA: Diagnosis not present

## 2022-03-08 LAB — URINALYSIS, ROUTINE W REFLEX MICROSCOPIC
Bilirubin Urine: NEGATIVE
Glucose, UA: NEGATIVE mg/dL
Ketones, ur: NEGATIVE mg/dL
Leukocytes,Ua: NEGATIVE
Nitrite: NEGATIVE
Protein, ur: NEGATIVE mg/dL
Specific Gravity, Urine: 1.015 (ref 1.005–1.030)
pH: 6.5 (ref 5.0–8.0)

## 2022-03-08 LAB — URINALYSIS, MICROSCOPIC (REFLEX)

## 2022-03-08 IMAGING — DX DG LUMBAR SPINE COMPLETE 4+V
5 series · 5 of 5 positions shown · non-contrast
Comparison: [DATE]

CLINICAL DATA: Low back pain

EXAM:
LUMBAR SPINE - COMPLETE 4+ VIEW

[l-spine ap]
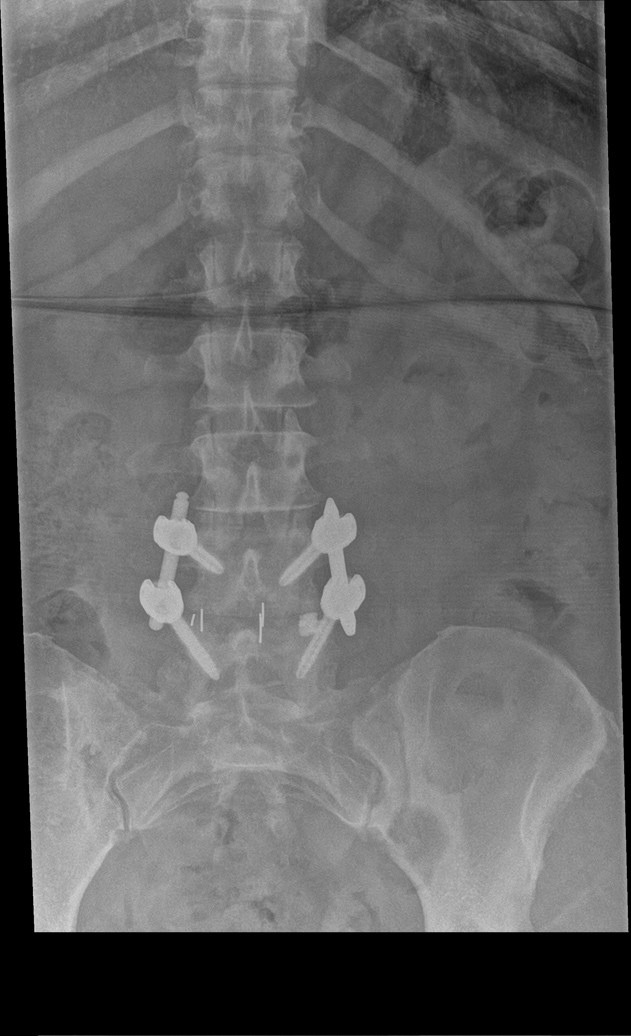

[l-spine obl (1 of 2)]
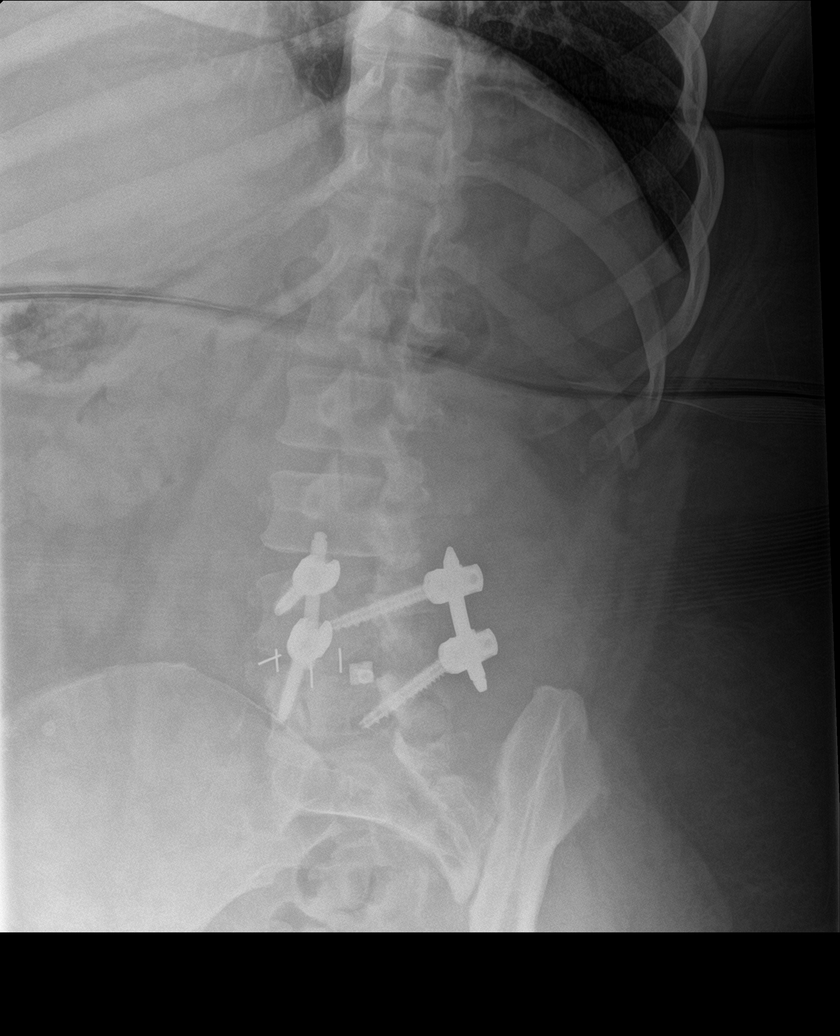

[l-spine obl (2 of 2)]
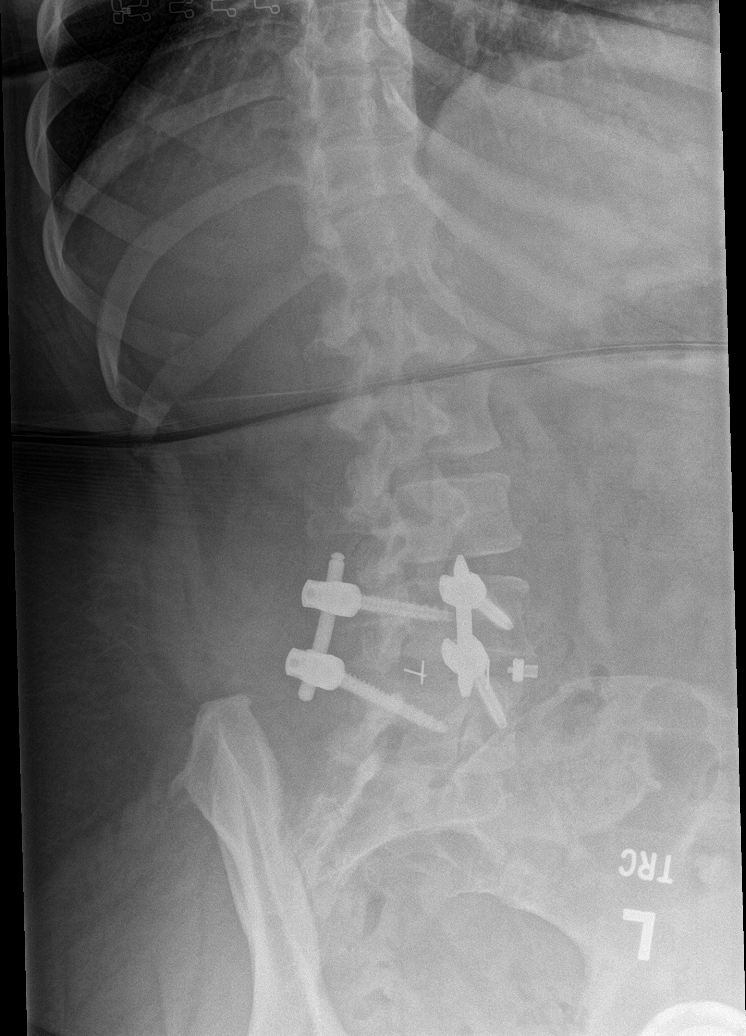

[l-spine lat]
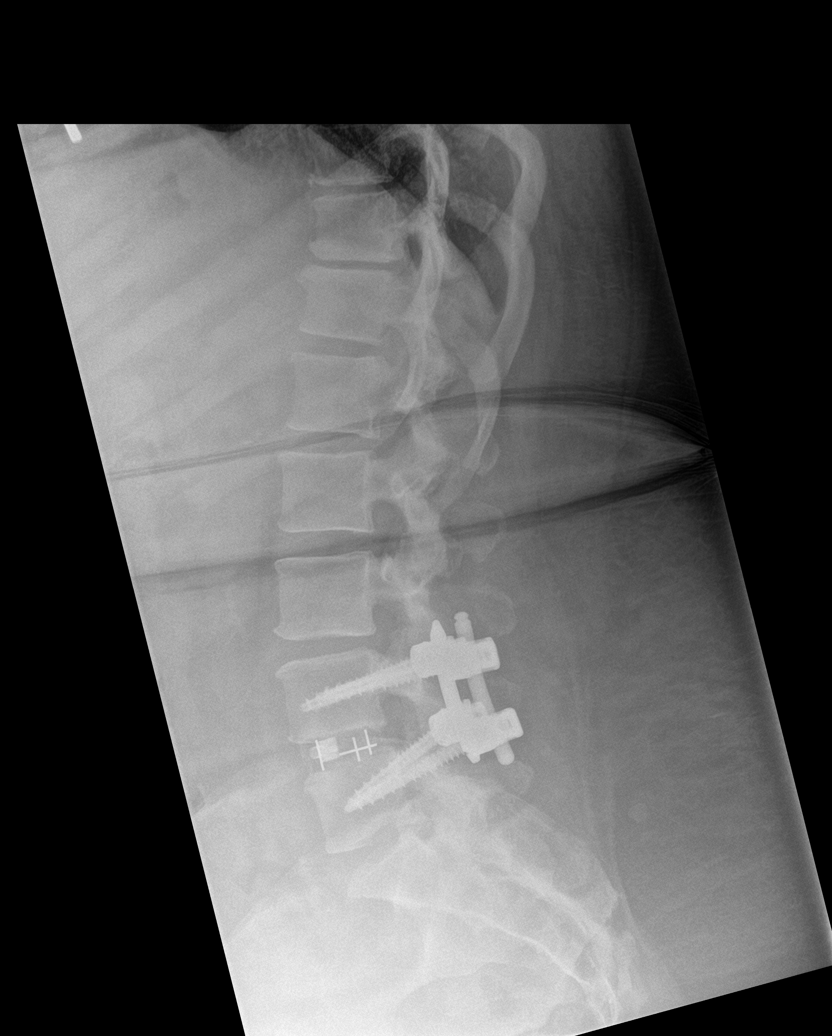

[l-spine spot]
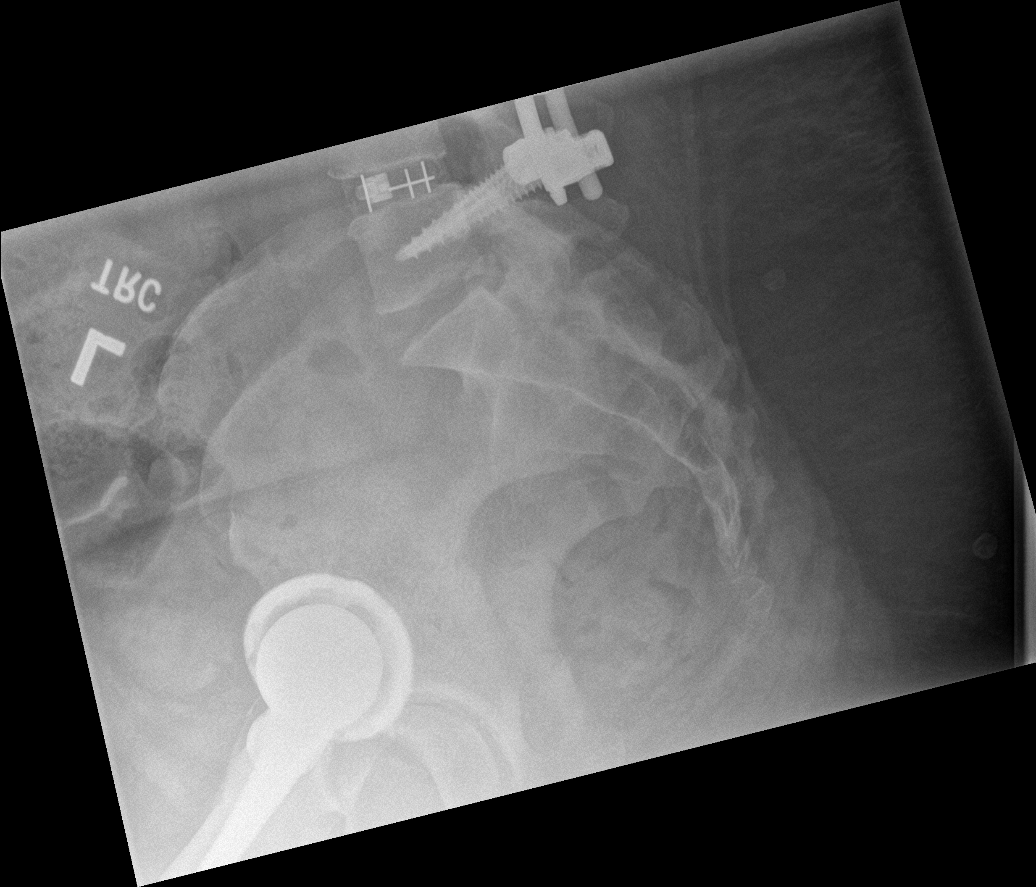

[5 of 5 positions shown; findings below may reference images not displayed]

FINDINGS: Status post posterior hardware fixation and interbody fusion at
L4-5. This appears unchanged from previous exam. The alignment of
the lumbar spine appears normal. No signs of acute fracture or
dislocation. Mild disc space narrowing endplate spurring is
identified at L3-4.
IMPRESSION: 1. No acute findings.
2. Status post posterior hardware fixation and interbody fusion at
L4-5.

## 2022-03-08 MED ORDER — LIDOCAINE 5 % EX PTCH: 1.0000 | MEDICATED_PATCH | CUTANEOUS | 0 refills | Status: DC

## 2022-03-08 MED ORDER — NAPROXEN 375 MG PO TABS: 375.0000 mg | ORAL_TABLET | Freq: Two times a day (BID) | ORAL | 0 refills | Status: DC

## 2022-03-08 MED ORDER — KETOROLAC TROMETHAMINE 15 MG/ML IJ SOLN
15.0000 mg | Freq: Once | INTRAMUSCULAR | Status: AC
  Administered 2022-03-08: 15 mg via INTRAMUSCULAR
  Filled 2022-03-08: qty 1

## 2022-03-08 MED ORDER — METHOCARBAMOL 500 MG PO TABS: 500.0000 mg | ORAL_TABLET | Freq: Two times a day (BID) | ORAL | 0 refills | Status: DC

## 2022-03-08 NOTE — Discharge Instructions (Addendum)
Your x-ray was negative and all of your previously placed hardware is intact.  Your exam today was reassuring against a serious cause of back pain.  Your urine did not show any signs of UTI.  You received Toradol injection in the emergency room with some relief.  I have sent in naproxen which is anti-inflammatory, Robaxin a muscle relaxer, and lidocaine patch for you to use.  You can follow-up with your primary care doctor or return to the emergency room for any worsening symptoms. ?

## 2022-03-08 NOTE — ED Provider Notes (Signed)
?[facility] EMERGENCY DEPARTMENT ?Provider Note ? ? ?CSN: [id number] ?Arrival date & time: 03/08/22  1343 ? ?  ? ?History ? ?Chief Complaint  ?Patient presents with  ? Flank Pain  ? ? ?Sarah Randall is a 56 y.o. female. ? ?56 year old female presents today for evaluation of right flank pain of 1 month duration that worsened on Friday.  She denies associated fever, chills, nausea, vomiting, abdominal pain, dysuria, vaginal discharge.  She denies trauma or fall to the back.  She denies history of IV drug use, saddle anesthesia, weakness, difficulty ambulating, unintentional weight loss, or history of malignancy.  She does have history of low back pain, and has had lumbar surgery couple years ago.  She does state this pain does not feel similar to her typical back pain.  She states this started a month ago and came and went however since Friday has been relatively constant.  Lying down makes it worse and sitting up improves the pain. ? ?The history is provided by the patient. No language interpreter was used.  ? ?  ? ?Home Medications ?Prior to Admission medications   ?Medication Sig Start Date End Date Taking? Authorizing Provider  ?amoxicillin-clavulanate (AUGMENTIN) 875-125 MG tablet Take 1 tablet by mouth 2 (two) times daily. 01/27/22   Elsie Stain, MD  ?Cyanocobalamin (B-12) 5000 MCG CAPS Take 5,000 mcg by mouth every other day.    [provider]  ?diclofenac Sodium (VOLTAREN) 1 % GEL Apply 2 g topically 4 (four) times daily. ?Patient not taking: Reported on 01/27/2022 01/08/22   Ladell Pier, MD  ?esomeprazole (NEXIUM) 40 MG capsule TAKE 1 CAPSULE BY MOUTH EVERY DAY 12/12/21   Ladell Pier, MD  ?hydrochlorothiazide (HYDRODIURIL) 25 MG tablet TAKE 1 TABLET BY MOUTH EVERY DAY 01/01/22   Ladell Pier, MD  ?loratadine (CLARITIN) 10 MG tablet TAKE 1 TABLET BY MOUTH EVERY DAY AS NEEDED FOR ALLERGY 08/14/21   Ladell Pier, MD  ?losartan (COZAAR) 100 MG tablet Take 1 tablet (100 mg  total) by mouth daily. 01/08/22   Ladell Pier, MD  ?metFORMIN (GLUCOPHAGE-XR) 500 MG 24 hr tablet Take 1 tablet (500 mg total) by mouth daily with breakfast. 01/27/22   Elsie Stain, MD  ?nitroGLYCERIN (NITROSTAT) 0.4 MG SL tablet Place 1 tablet (0.4 mg total) under the tongue every 5 (five) minutes as needed. 10/21/18 12/22/19  Revankar, Reita Cliche, MD  ?propranolol (INDERAL) 10 MG tablet TAKE 1 TABLET BY MOUTH TWICE A DAY 01/01/22   Ladell Pier, MD  ?   ? ?Allergies    ?Pregabalin, Cymbalta [duloxetine hcl], Escitalopram, Gabapentin, Lipitor [atorvastatin], Pravachol [pravastatin], and Zoloft [sertraline hcl]   ? ?Review of Systems   ?Review of Systems  ?Constitutional:  Negative for chills, fever and unexpected weight change.  ?Respiratory:  Negative for shortness of breath.   ?Cardiovascular:  Negative for chest pain.  ?Gastrointestinal:  Negative for abdominal pain, nausea and vomiting.  ?Genitourinary:  Negative for dysuria.  ?Musculoskeletal:  Positive for arthralgias and back pain. Negative for gait problem.  ?Neurological:  Negative for weakness and light-headedness.  ?All other systems reviewed and are negative. ? ?Physical Exam ?Updated Vital Signs ?BP (!) 135/100 (BP Location: Left Arm)   Pulse 95   Temp 98.1 ?F (36.7 ?C) (Oral)   Resp 18   LMP  (LMP Unknown)   SpO2 100%  ?Physical Exam ?Vitals and nursing note reviewed.  ?Constitutional:   ?   General: She is  not in acute distress. ?   Appearance: Normal appearance. She is not ill-appearing.  ?HENT:  ?   Head: Normocephalic and atraumatic.  ?   Nose: Nose normal.  ?Eyes:  ?   General: No scleral icterus. ?   Extraocular Movements: Extraocular movements intact.  ?   Conjunctiva/sclera: Conjunctivae normal.  ?Cardiovascular:  ?   Rate and Rhythm: Normal rate and regular rhythm.  ?   Pulses: Normal pulses.  ?   Heart sounds: Normal heart sounds.  ?Pulmonary:  ?   Effort: Pulmonary effort is normal. No respiratory distress.  ?   Breath  sounds: Normal breath sounds. No wheezing or rales.  ?Abdominal:  ?   General: There is no distension.  ?   Tenderness: There is no abdominal tenderness. There is no right CVA tenderness, left CVA tenderness or guarding.  ?Musculoskeletal:     ?   General: Normal range of motion.  ?   Cervical back: Normal range of motion.  ?   Comments: Cervical, thoracic, lumbar spine without tenderness to palpation.  Right lumbar paraspinal muscles with mild TTP.  Range of motion in bilateral upper and lower extremities intact.  5/5 strength in bilateral upper and lower extremities with flexor and extensor muscle groups.  2+ DP pulse in bilateral feet with brisk cap refill.  Sensation intact.  Patient able ambulate without difficulty.  ?Skin: ?   General: Skin is warm and dry.  ?Neurological:  ?   General: No focal deficit present.  ?   Mental Status: She is alert. Mental status is at baseline.  ? ? ?ED Results / Procedures / Treatments   ?Labs ?(all labs ordered are listed, but only abnormal results are displayed) ?Labs Reviewed  ?URINALYSIS, ROUTINE W REFLEX MICROSCOPIC - Abnormal; Notable for the following components:  ?    Result Value  ? Hgb urine dipstick TRACE (*)   ? All other components within normal limits  ?URINALYSIS, MICROSCOPIC (REFLEX) - Abnormal; Notable for the following components:  ? Bacteria, UA RARE (*)   ? All other components within normal limits  ? ? ?EKG ?None ? ?Radiology ?No results found. ? ?Procedures ?Procedures  ? ? ?Medications Ordered in ED ?Medications  ?ketorolac (TORADOL) 15 MG/ML injection 15 mg (has no administration in time range)  ? ? ?ED Course/ Medical Decision Making/ A&P ?  ?                        ?Medical Decision Making ?Amount and/or Complexity of Data Reviewed ?Labs: ordered. ?Radiology: ordered. ? ?Risk ?Prescription drug management. ? ? ?Medical Decision Making / ED Course ? ? ?This patient presents to the ED for concern of neck pain, this involves an extensive number of  treatment options, and is a complaint that carries with it a high risk of complications and morbidity.  The differential diagnosis includes lumbar strain, cauda equina syndrome, spinal epidural abscess, UTI, pyelonephritis ? ?MDM: ?56 year old female presents today for evaluation of right flank pain of 1 month duration.  It is worse with palpation.  She is without dysuria, or other associated symptoms concerning for radiating abdominal pain, UTI.  She is without red flag signs or symptoms concerning for cauda equina or spinal epidural abscess.  She has good strength and is ambulating without difficulty.  We will provide her with Toradol injection, obtain lumbar x-ray.  UA without signs of UTI.  Without complaint of dysuria.  ?X-ray without evidence of acute  process.  Hardware in place.  Patient is appropriate for discharge.  Prescription for naproxen, Robaxin, lidocaine patch provided.  Discharged in stable condition.  Return precautions discussed.  Patient voices understanding and is in agreement with plan. ? ? ?Lab Tests: ?-I ordered, reviewed, and interpreted labs.   ?The pertinent results include:   ?Labs Reviewed  ?URINALYSIS, ROUTINE W REFLEX MICROSCOPIC - Abnormal; Notable for the following components:  ?    Result Value  ? Hgb urine dipstick TRACE (*)   ? All other components within normal limits  ?URINALYSIS, MICROSCOPIC (REFLEX) - Abnormal; Notable for the following components:  ? Bacteria, UA RARE (*)   ? All other components within normal limits  ?  ? ? ?EKG ? EKG Interpretation ? ?Date/Time:    ?Ventricular Rate:    ?PR Interval:    ?QRS Duration:   ?QT Interval:    ?QTC Calculation:   ?R Axis:     ?Text Interpretation:   ?  ? ?  ? ? ? ?Imaging Studies ordered: ?I ordered imaging studies including lumbar x-ray ?I independently visualized and interpreted imaging. ?I agree with the radiologist interpretation ? ? ?Medicines ordered and prescription drug management: ?Meds ordered this encounter   ?Medications  ? ketorolac (TORADOL) 15 MG/ML injection 15 mg  ?  ?-I have reviewed the patients home medicines and have made adjustments as needed ? ?Reevaluation: ?After the interventions noted above, I reevaluated the patient

## 2022-03-08 NOTE — ED Notes (Signed)
Pt. Reports her R side more R back is hurting and feels better sitting up and   Hurts even with reclyning back ?

## 2022-03-08 NOTE — ED Triage Notes (Signed)
Pt reports R flank pain for the past month. No known injury. Denies any urinary symptoms.  ?

## 2022-03-11 DIAGNOSIS — Z1159 Encounter for screening for other viral diseases: Secondary | ICD-10-CM | POA: Diagnosis not present

## 2022-03-11 DIAGNOSIS — M7062 Trochanteric bursitis, left hip: Secondary | ICD-10-CM | POA: Diagnosis not present

## 2022-03-11 DIAGNOSIS — Z711 Person with feared health complaint in whom no diagnosis is made: Secondary | ICD-10-CM | POA: Diagnosis not present

## 2022-03-11 DIAGNOSIS — Z87891 Personal history of nicotine dependence: Secondary | ICD-10-CM | POA: Diagnosis not present

## 2022-03-11 DIAGNOSIS — M159 Polyosteoarthritis, unspecified: Secondary | ICD-10-CM | POA: Diagnosis not present

## 2022-03-11 DIAGNOSIS — R5383 Other fatigue: Secondary | ICD-10-CM | POA: Diagnosis not present

## 2022-03-11 DIAGNOSIS — Z0000 Encounter for general adult medical examination without abnormal findings: Secondary | ICD-10-CM | POA: Diagnosis not present

## 2022-03-11 DIAGNOSIS — E7800 Pure hypercholesterolemia, unspecified: Secondary | ICD-10-CM | POA: Diagnosis not present

## 2022-03-11 DIAGNOSIS — Z79899 Other long term (current) drug therapy: Secondary | ICD-10-CM | POA: Diagnosis not present

## 2022-03-11 DIAGNOSIS — E559 Vitamin D deficiency, unspecified: Secondary | ICD-10-CM | POA: Diagnosis not present

## 2022-03-11 DIAGNOSIS — R030 Elevated blood-pressure reading, without diagnosis of hypertension: Secondary | ICD-10-CM | POA: Diagnosis not present

## 2022-03-11 DIAGNOSIS — E1169 Type 2 diabetes mellitus with other specified complication: Secondary | ICD-10-CM | POA: Diagnosis not present

## 2022-03-31 ENCOUNTER — Other Ambulatory Visit: Payer: Self-pay | Admitting: Internal Medicine

## 2022-03-31 DIAGNOSIS — G250 Essential tremor: Secondary | ICD-10-CM

## 2022-03-31 DIAGNOSIS — G43011 Migraine without aura, intractable, with status migrainosus: Secondary | ICD-10-CM

## 2022-03-31 DIAGNOSIS — I10 Essential (primary) hypertension: Secondary | ICD-10-CM

## 2022-04-14 DIAGNOSIS — R030 Elevated blood-pressure reading, without diagnosis of hypertension: Secondary | ICD-10-CM | POA: Diagnosis not present

## 2022-04-14 DIAGNOSIS — M7062 Trochanteric bursitis, left hip: Secondary | ICD-10-CM | POA: Diagnosis not present

## 2022-04-14 DIAGNOSIS — Z87891 Personal history of nicotine dependence: Secondary | ICD-10-CM | POA: Diagnosis not present

## 2022-04-14 DIAGNOSIS — Z79899 Other long term (current) drug therapy: Secondary | ICD-10-CM | POA: Diagnosis not present

## 2022-04-14 DIAGNOSIS — G8929 Other chronic pain: Secondary | ICD-10-CM | POA: Diagnosis not present

## 2022-04-14 DIAGNOSIS — E1169 Type 2 diabetes mellitus with other specified complication: Secondary | ICD-10-CM | POA: Diagnosis not present

## 2022-04-14 DIAGNOSIS — M5441 Lumbago with sciatica, right side: Secondary | ICD-10-CM | POA: Diagnosis not present

## 2022-04-14 DIAGNOSIS — M159 Polyosteoarthritis, unspecified: Secondary | ICD-10-CM | POA: Diagnosis not present

## 2022-04-14 DIAGNOSIS — E7800 Pure hypercholesterolemia, unspecified: Secondary | ICD-10-CM | POA: Diagnosis not present

## 2022-04-15 ENCOUNTER — Encounter: Payer: Self-pay | Admitting: Internal Medicine

## 2022-04-17 DIAGNOSIS — Z79899 Other long term (current) drug therapy: Secondary | ICD-10-CM | POA: Diagnosis not present

## 2022-04-27 DIAGNOSIS — M5412 Radiculopathy, cervical region: Secondary | ICD-10-CM | POA: Diagnosis not present

## 2022-04-27 DIAGNOSIS — M4316 Spondylolisthesis, lumbar region: Secondary | ICD-10-CM | POA: Diagnosis not present

## 2022-04-28 ENCOUNTER — Other Ambulatory Visit: Payer: Self-pay | Admitting: Neurosurgery

## 2022-04-28 DIAGNOSIS — Z122 Encounter for screening for malignant neoplasm of respiratory organs: Secondary | ICD-10-CM | POA: Diagnosis not present

## 2022-04-28 DIAGNOSIS — M5412 Radiculopathy, cervical region: Secondary | ICD-10-CM

## 2022-04-28 DIAGNOSIS — Z87891 Personal history of nicotine dependence: Secondary | ICD-10-CM | POA: Diagnosis not present

## 2022-05-06 ENCOUNTER — Encounter: Payer: Self-pay | Admitting: Pharmacist

## 2022-05-06 DIAGNOSIS — T466X5A Adverse effect of antihyperlipidemic and antiarteriosclerotic drugs, initial encounter: Secondary | ICD-10-CM

## 2022-05-06 NOTE — Progress Notes (Signed)
[facility])                                            [place] Team                                        Statin Quality Measure Assessment    05/06/2022  Sarah Randall 06-23-1966 [id number]      Per review of chart and payor information, patient has a diagnosis of diabetes but is not currently filling a statin prescription.  This places patient into the SUPD (Statin Use In Patients with Diabetes) measure for CMS.    Patient has documented allergy to statin but no corresponding CPT codes that would exclude patient from SUPD measure.  (Statin myopathy)If deemed therapeutically appropriate, a different statin could be selected with alternative dosing or a statin exclusion code could be associated with the upcoming visit.  Associating a code will remove the patient from the measure. She has an upcoming appointment on 05/08/2022.      Component Value Date/Time   CHOL 339 (H) 01/08/2022 1218   TRIG 175 (H) 01/08/2022 1218   HDL 40 01/08/2022 1218   CHOLHDL 8.5 (H) 01/08/2022 1218   LDLCALC 264 (H) 01/08/2022 1218    Please consider ONE of the following recommendations:  Initiate high intensity statin Atorvastatin '40mg'$  once daily, #90, 3 refills   Rosuvastatin '20mg'$  once daily, #90, 3 refills    Initiate moderate intensity          statin with reduced frequency if prior          statin intolerance 1x weekly, #13, 3 refills   2x weekly, #26, 3 refills   3x weekly, #39, 3 refills    Code for past statin intolerance or  other exclusions (required annually)   Provider Requirements:  Associate code during an office visit or telehealth encounter  Drug Induced Myopathy G72.0   Myopathy, unspecified G72.9   Myositis, unspecified M60.9   Rhabdomyolysis A41.66   Alcoholic fatty liver A63.0   Cirrhosis of liver K74.69   Prediabetes R73.03   PCOS E28.2   Toxic liver disease, unspecified K71.9       Plan:  Send note  to provider prior to the PCP appointment.   Elayne Guerin, PharmD, [place] Clinical Pharmacist [phone]

## 2022-05-08 ENCOUNTER — Ambulatory Visit: Payer: Medicare Other | Admitting: Internal Medicine

## 2022-05-10 ENCOUNTER — Other Ambulatory Visit: Payer: Medicare Other

## 2022-05-10 ENCOUNTER — Ambulatory Visit
Admission: RE | Admit: 2022-05-10 | Discharge: 2022-05-10 | Disposition: A | Discharge status: Home / Self Care | Payer: Medicare Other | Source: Ambulatory Visit | Attending: Neurosurgery | Admitting: Neurosurgery

## 2022-05-10 ENCOUNTER — Encounter: Payer: Self-pay | Admitting: Internal Medicine

## 2022-05-10 DIAGNOSIS — M5412 Radiculopathy, cervical region: Secondary | ICD-10-CM

## 2022-05-10 DIAGNOSIS — M4802 Spinal stenosis, cervical region: Secondary | ICD-10-CM | POA: Diagnosis not present

## 2022-05-10 DIAGNOSIS — M542 Cervicalgia: Secondary | ICD-10-CM | POA: Diagnosis not present

## 2022-05-10 IMAGING — MR MR CERVICAL SPINE W/O CM
4 of 5 series · 27 of 48 positions shown · non-contrast
Comparison: [AA]

CLINICAL DATA: Neck pain radiating to right arm

EXAM:
MRI CERVICAL SPINE WITHOUT CONTRAST
TECHNIQUE: Multiplanar, multisequence MR imaging of the cervical spine was
performed. No intravenous contrast was administered.

[Series 5: T2 · sagittal · 3.0mm · 0.55mm/px · 6 of 15 slices shown (1 of 2)]
[im 1/15]
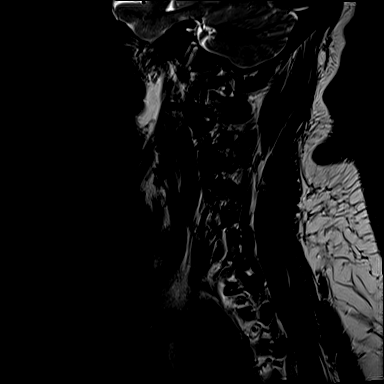
[im 3/15]
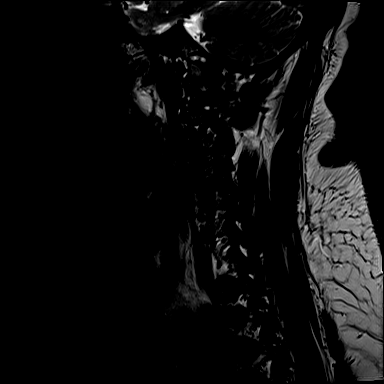
[im 6/15]
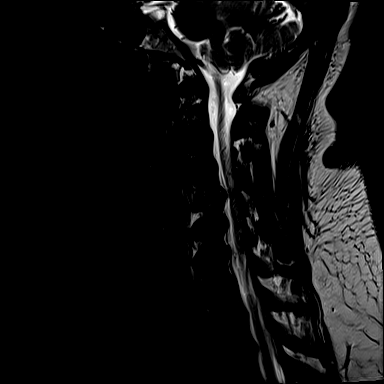
[im 9/15]
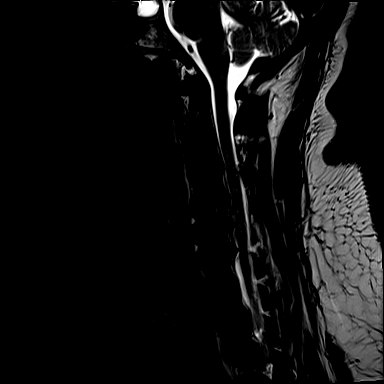
[im 12/15]
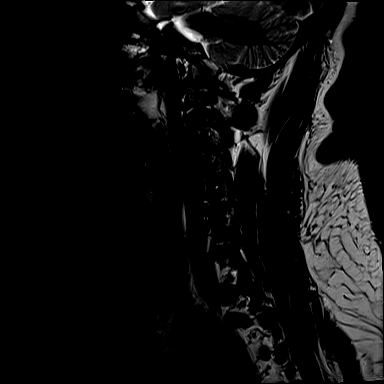
[im 15/15]
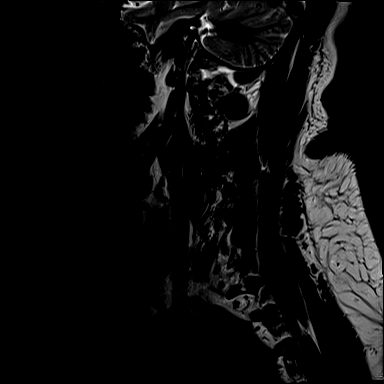

[Series 6: T1 · sagittal · 3.0mm · 0.66mm/px · 7 of 15 slices shown]
[im 1/15]
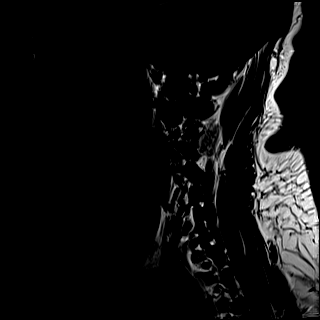
[im 3/15]
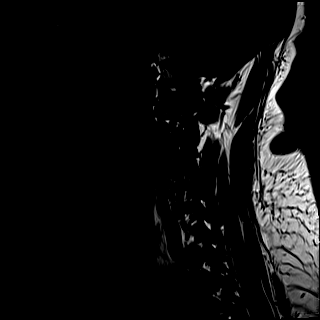
[im 5/15]
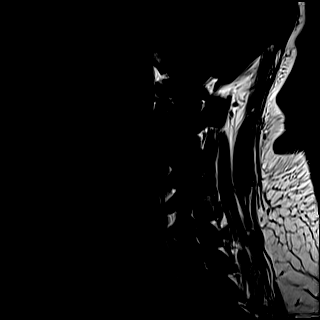
[im 8/15]
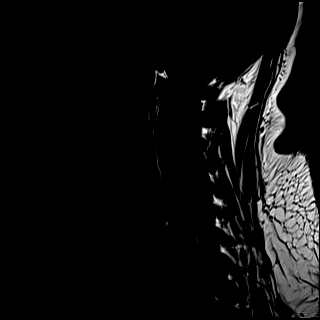
[im 10/15]
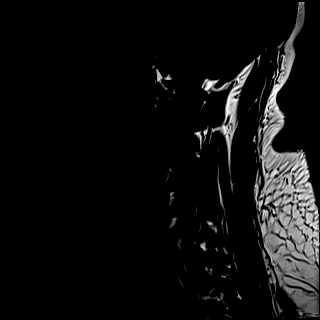
[im 12/15]
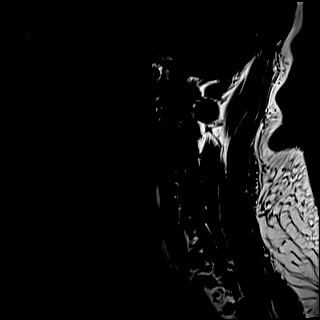
[im 15/15]
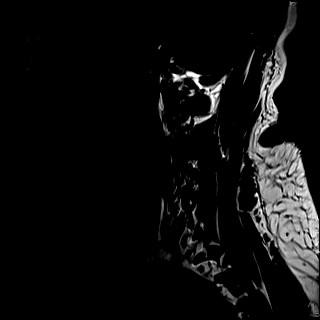

[Series 7: STIR · sagittal · 3.0mm · 0.33mm/px · 6 of 15 slices shown]
[im 1/15]
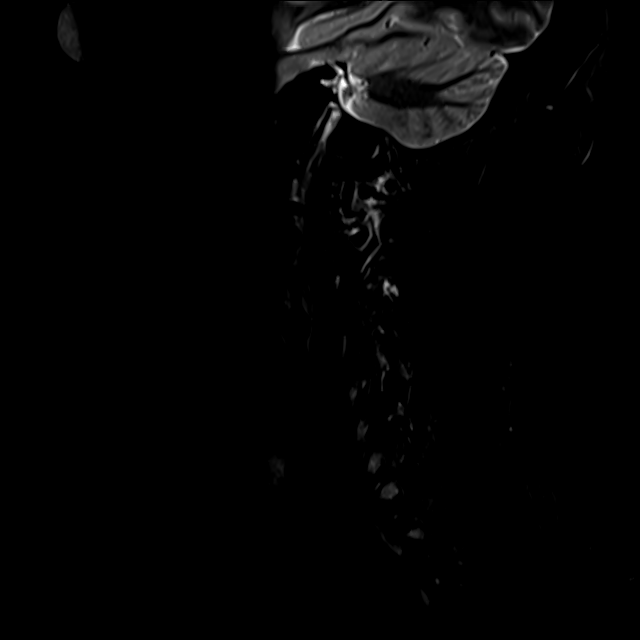
[im 3/15]
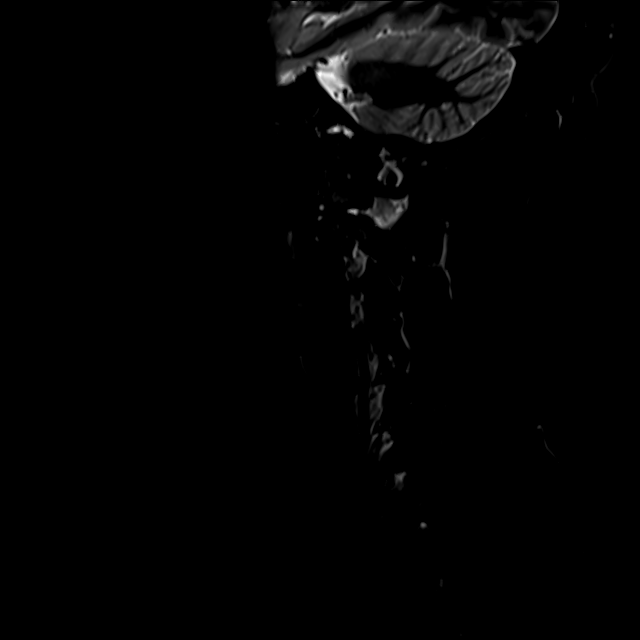
[im 5/15]
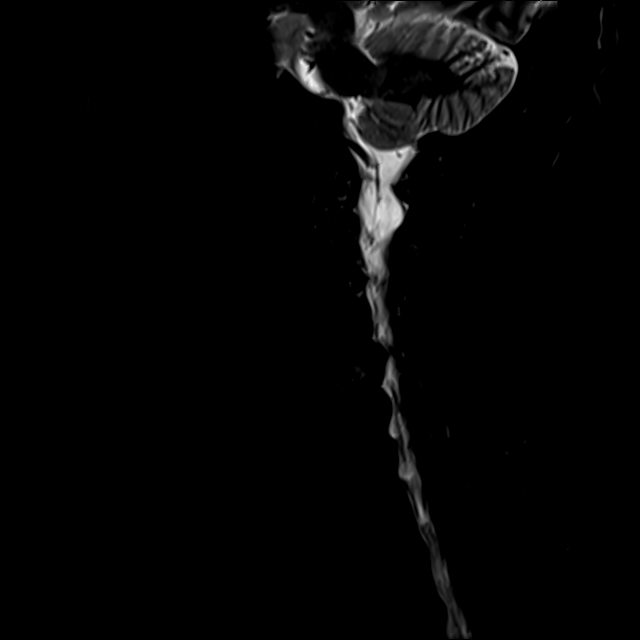
[im 8/15]
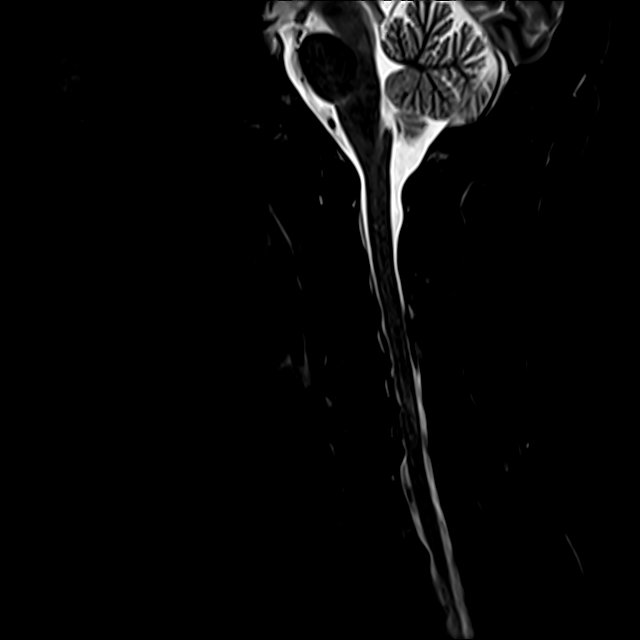
[im 10/15]
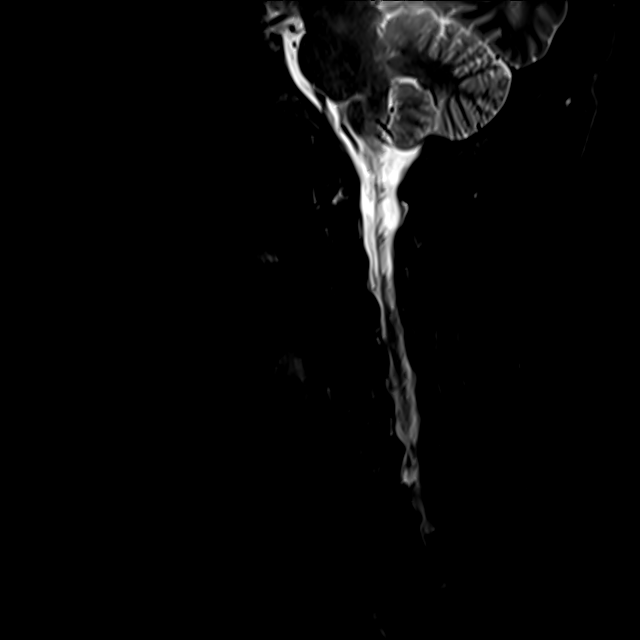
[im 12/15]
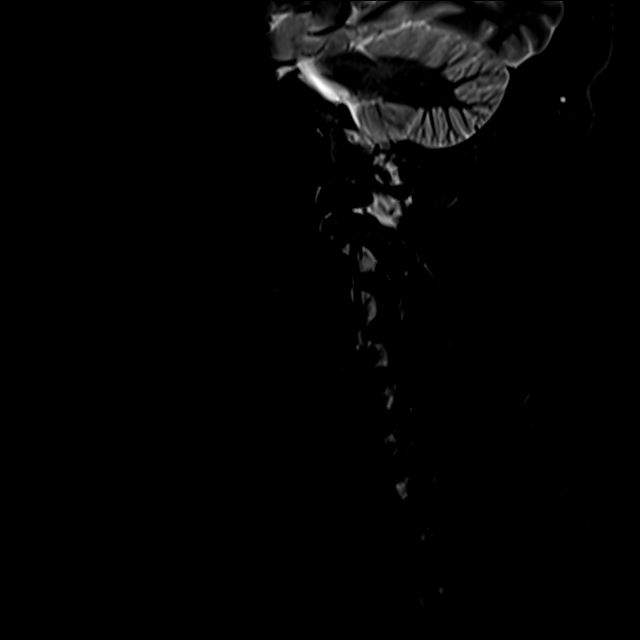

[Series 8: T2 · axial · 3.0mm · 0.50mm/px · z∈[-83,+11]mm · 8 of 30 slices shown (2 of 2)]
[im 1/30]
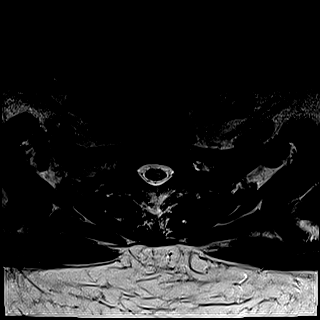
[im 5/30]
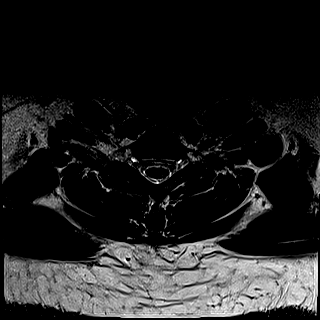
[im 9/30]
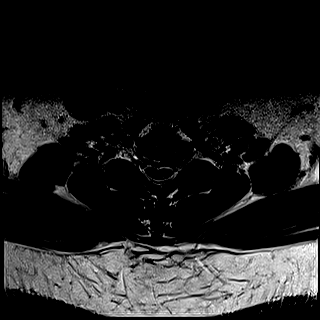
[im 14/30]
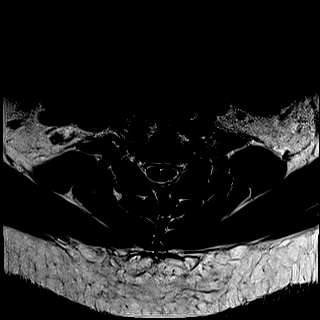
[im 16/30]
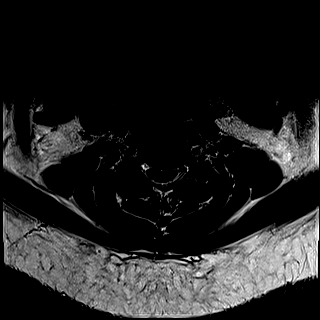
[im 21/30]
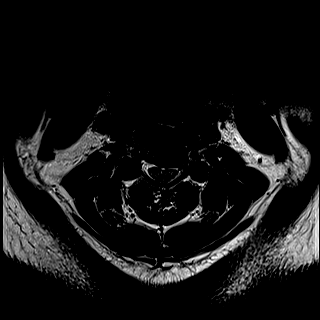
[im 25/30]
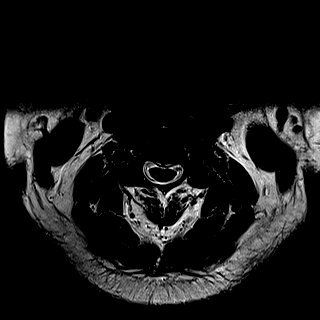
[im 30/30]
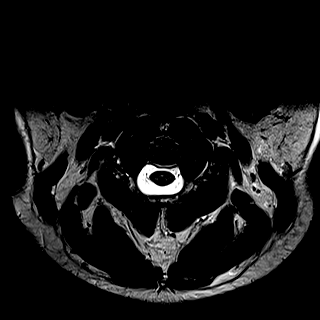

[27 of 48 positions shown; findings below may reference images not displayed]

FINDINGS: Alignment: No significant listhesis.

Vertebrae: Vertebral body heights are maintained. Minor degenerative
endplate irregularity eccentric to the right at C4-C5. No focal
suspicious osseous lesion.

Cord: Normal caliber.

Posterior Fossa, vertebral arteries, paraspinal tissues:
Unremarkable.

Disc levels: Congenital narrowing of the spinal canal.

C2-C3:  No canal or foraminal stenosis.

C3-C4:  Disc bulge.  Mild canal stenosis.  No foraminal stenosis.

C4-C5: Disc bulge with endplate osteophytes. Uncovertebral
hypertrophy. Mild canal stenosis. Increased moderate to marked right
and mild left foraminal stenosis.

C5-C6: Disc bulge with endplate osteophytes and superimposed central
protrusion. Uncovertebral hypertrophy. Mild canal stenosis. Disc
contacts the ventral cord. Increased mild right and mild to moderate
left foraminal stenosis.

C6-C7: Disc bulge with endplate osteophytes eccentric to the left.
Left greater than right uncovertebral hypertrophy. Mild canal
stenosis. No right foraminal stenosis. Marked left foraminal
stenosis.

C7-T1:  No canal or foraminal stenosis.
IMPRESSION: Multilevel degenerative changes as detailed above. Increased right
foraminal narrowing particularly at C4-C5. No high-grade canal
stenosis. Left foraminal remains greatest at C6-C7.

## 2022-05-13 DIAGNOSIS — M5412 Radiculopathy, cervical region: Secondary | ICD-10-CM | POA: Diagnosis not present

## 2022-05-15 DIAGNOSIS — M159 Polyosteoarthritis, unspecified: Secondary | ICD-10-CM | POA: Diagnosis not present

## 2022-05-15 DIAGNOSIS — M5441 Lumbago with sciatica, right side: Secondary | ICD-10-CM | POA: Diagnosis not present

## 2022-05-15 DIAGNOSIS — M7062 Trochanteric bursitis, left hip: Secondary | ICD-10-CM | POA: Diagnosis not present

## 2022-05-15 DIAGNOSIS — Z79899 Other long term (current) drug therapy: Secondary | ICD-10-CM | POA: Diagnosis not present

## 2022-05-27 ENCOUNTER — Other Ambulatory Visit: Payer: Self-pay | Admitting: Internal Medicine

## 2022-05-27 DIAGNOSIS — K219 Gastro-esophageal reflux disease without esophagitis: Secondary | ICD-10-CM

## 2022-06-12 DIAGNOSIS — E559 Vitamin D deficiency, unspecified: Secondary | ICD-10-CM | POA: Diagnosis not present

## 2022-06-12 DIAGNOSIS — E7800 Pure hypercholesterolemia, unspecified: Secondary | ICD-10-CM | POA: Diagnosis not present

## 2022-06-12 DIAGNOSIS — I10 Essential (primary) hypertension: Secondary | ICD-10-CM | POA: Diagnosis not present

## 2022-06-12 DIAGNOSIS — Z79899 Other long term (current) drug therapy: Secondary | ICD-10-CM | POA: Diagnosis not present

## 2022-06-12 DIAGNOSIS — M7062 Trochanteric bursitis, left hip: Secondary | ICD-10-CM | POA: Diagnosis not present

## 2022-06-12 DIAGNOSIS — E1169 Type 2 diabetes mellitus with other specified complication: Secondary | ICD-10-CM | POA: Diagnosis not present

## 2022-06-12 DIAGNOSIS — M5441 Lumbago with sciatica, right side: Secondary | ICD-10-CM | POA: Diagnosis not present

## 2022-06-12 DIAGNOSIS — G8929 Other chronic pain: Secondary | ICD-10-CM | POA: Diagnosis not present

## 2022-06-16 DIAGNOSIS — Z79899 Other long term (current) drug therapy: Secondary | ICD-10-CM | POA: Diagnosis not present

## 2022-06-26 ENCOUNTER — Encounter: Payer: Self-pay | Admitting: Orthopaedic Surgery

## 2022-06-26 ENCOUNTER — Ambulatory Visit (INDEPENDENT_AMBULATORY_CARE_PROVIDER_SITE_OTHER): Payer: Medicare Other | Admitting: Orthopaedic Surgery

## 2022-06-26 DIAGNOSIS — M65321 Trigger finger, right index finger: Secondary | ICD-10-CM | POA: Diagnosis not present

## 2022-06-26 NOTE — Progress Notes (Unsigned)
Office Visit Note   Patient: Sarah Randall           Date of Birth: 1966-11-01           MRN: [id number] Visit Date: 06/26/2022              Requested by: Ladell Pier, MD [address],  [place] [id number] PCP: Ladell Pier, MD   Assessment & Plan: Visit Diagnoses:  1. Trigger index finger of right hand     Plan: Impression is right index trigger finger.  Today, we discussed there is treatment options to include cortisone injection and night splinting.  She is agreeable to this plan.  She will follow-up as needed.  Follow-Up Instructions: No follow-ups on file.   Orders:  Orders Placed This Encounter  Procedures   Hand/UE Inj   No orders of the defined types were placed in this encounter.     Procedures: Hand/UE Inj for trigger finger on 06/26/2022 10:16 AM Indications: pain Details: 25 G needle Medications: 1 mL lidocaine 1 %; 0.33 mL bupivacaine 0.25 %; 13.33 mg methylPREDNISolone acetate 40 MG/ML      Clinical Data: No additional findings.   Subjective: Chief Complaint  Patient presents with   Right Hand - Pain    Index finger   Lower Back - Pain    HPI patient is a pleasant 56 year old female who comes in today with pain and triggering to the right index finger for the past month which is progressively worsened.  Symptoms appear to be worse at night.  Her pain is to the A1 pulley of the index finger.  She denies any paresthesias.  She does have a history of right trigger thumb which has never been injected before.  She also notes she is prediabetic.  Review of Systems as detailed in HPI.  All others reviewed and are negative.   Objective: Vital Signs: LMP 10/10/2019   Physical Exam well-developed well-nourished female no acute distress.  Alert and oriented x3.  Ortho Exam right hand exam shows moderate tenderness at the A1 pulley of the index finger.  She does have reproducible triggering.  She is neurovascular tact  distally.  Specialty Comments:  No specialty comments available.  Imaging: No new imaging   PMFS History: Patient Active Problem List   Diagnosis Date Noted   Recurrent sinusitis 01/27/2022   Trigger index finger of right hand 01/27/2022   Acute otitis media 12/23/2021   Multiple joint pain 02/10/2021   Positive ANA (antinuclear antibody) 12/13/2020   Trigger finger of left thumb 12/10/2020   Lumbar radiculopathy 11/07/2020   Chronic right-sided low back pain with right-sided sciatica 10/10/2020   Sleep disturbance 10/10/2020   Myalgia 10/10/2020   Super obese 10/10/2020   Abnormality of gait 10/10/2020   Statin intolerance 09/17/2020   Recurrent boils 09/17/2020   Chronic bilateral low back pain 05/16/2020   Postoperative state 12/27/2019   Iron deficiency 08/03/2019   B12 deficiency 06/30/2019   Chronic right shoulder pain 07/05/2018   Trochanteric bursitis of left hip 07/05/2018   Morbid obesity ([place]) 07/05/2018   Body mass index 45.0-49.9, adult ([place]) 07/05/2018   Marijuana user 05/26/2018   Status post total right knee replacement 01/12/2018   Status post total hip replacement, left 09/22/2017   Hyperlipidemia 08/10/2017   Mild depression 06/25/2017   Essential hypertension 06/25/2017   Tobacco abuse 06/25/2017   Prediabetes 06/25/2017   OSA on CPAP 06/25/2017  Bronchitis 09/24/2016   Leukocytosis 09/24/2016   Radiculopathy, lumbar region 07/16/2016   Degenerative lumbar disc 07/03/2016   Synovitis of hip 04/20/2016   Screening for breast cancer 12/28/2014   Type 2 diabetes mellitus with obesity ([place]) 04/06/2014   Pain in joint involving lower leg 05/13/2013   Past Medical History:  Diagnosis Date   Anemia    Anxiety    Arthritis    B12 deficiency 06/30/2019   Depression    GERD (gastroesophageal reflux disease)    occasionally takes OTC   History of leukocytosis    Hyperlipidemia    Hypertension    Low back pain    Obesity    Pain in right  buttock    Goes down to Right leg to lateral aspect of calf.   Pre-diabetes    Sleep apnea    tested 2014 - unable to tolerate machine   Spondylolisthesis at L4-L5 level     Family History  Problem Relation Age of Onset   Hypertension Mother    Diabetes Mother    Breast cancer Mother 72   Hypertension Father    Lung cancer Maternal Aunt    Colon polyps Brother    Colon cancer Neg Hx    Esophageal cancer Neg Hx    Rectal cancer Neg Hx    Stomach cancer Neg Hx     Past Surgical History:  Procedure Laterality Date   ABDOMINAL EXPOSURE N/A 01/22/2021   Procedure: LATERAL EXPOSURE FOR OBLIQUE LATERAL INTERBODY FUSION;  Surgeon: Marty Heck, MD;  Location: [facility];  Service: Vascular;  Laterality: N/A;   ABDOMINAL HYSTERECTOMY     ANTERIOR LUMBAR FUSION N/A 01/22/2021   Procedure: Oblique Lateral Interbody Fusion Lumbar Four-Lumbar Five;  Surgeon: Vallarie Mare, MD;  Location: [facility];  Service: Neurosurgery;  Laterality: N/A;   LEFT HEART CATH AND CORONARY ANGIOGRAPHY N/A 10/25/2018   Procedure: LEFT HEART CATH AND CORONARY ANGIOGRAPHY;  Surgeon: Martinique, Peter M, MD;  Location: [facility];  Service: Cardiovascular;  Laterality: N/A;   LUMBAR PERCUTANEOUS PEDICLE SCREW 1 LEVEL N/A 01/22/2021   Procedure: LUMBAR PERCUTANEOUS PEDICLE SCREW LUBAR FOUR- FIVE;  Surgeon: Vallarie Mare, MD;  Location: [facility];  Service: Neurosurgery;  Laterality: N/A;   ROBOTIC ASSISTED LAPAROSCOPIC HYSTERECTOMY AND SALPINGECTOMY Bilateral 12/27/2019   Procedure: XI ROBOTIC ASSISTED TOTAL LAPAROSCOPIC HYSTERECTOMY AND SALPINGECTOMY;  Surgeon: Princess Bruins, MD;  Location: [facility];  Service: Gynecology;  Laterality: Bilateral;   TOTAL HIP ARTHROPLASTY Left 09/22/2017   Procedure: LEFT TOTAL HIP ARTHROPLASTY ANTERIOR APPROACH;  Surgeon: Leandrew Koyanagi, MD;  Location: [facility];  Service: Orthopedics;  Laterality: Left;   TOTAL KNEE ARTHROPLASTY Right 01/12/2018   Procedure:  RIGHT TOTAL KNEE ARTHROPLASTY;  Surgeon: Leandrew Koyanagi, MD;  Location: [facility];  Service: Orthopedics;  Laterality: Right;   TUBAL LIGATION     Social History   Occupational History   Occupation: unemployed  Tobacco Use   Smoking status: Every Day    Packs/day: 0.25    Years: 30.00    Total pack years: 7.50    Types: Cigarettes   Smokeless tobacco: Never  Vaping Use   Vaping Use: Never used  Substance and Sexual Activity   Alcohol use: Yes    Alcohol/week: 4.0 - 6.0 standard drinks of alcohol    Types: 4 - 6 Shots of liquor per week    Comment: occ   Drug use: Yes    Frequency: 2.0 times  per week    Types: Marijuana   Sexual activity: Yes    Birth control/protection: None    Comment: declined insurance questions,des neg

## 2022-06-29 MED ORDER — METHYLPREDNISOLONE ACETATE 40 MG/ML IJ SUSP
13.3300 mg | INTRAMUSCULAR | Status: AC | PRN
  Administered 2022-06-26: 13.33 mg

## 2022-06-29 MED ORDER — LIDOCAINE HCL 1 % IJ SOLN
1.0000 mL | INTRAMUSCULAR | Status: AC | PRN
  Administered 2022-06-26: 1 mL

## 2022-06-29 MED ORDER — BUPIVACAINE HCL 0.25 % IJ SOLN
0.3300 mL | INTRAMUSCULAR | Status: AC | PRN
  Administered 2022-06-26: .33 mL

## 2022-07-01 ENCOUNTER — Other Ambulatory Visit: Payer: Self-pay | Admitting: Internal Medicine

## 2022-07-01 DIAGNOSIS — G250 Essential tremor: Secondary | ICD-10-CM

## 2022-07-01 DIAGNOSIS — I10 Essential (primary) hypertension: Secondary | ICD-10-CM

## 2022-07-01 DIAGNOSIS — G43011 Migraine without aura, intractable, with status migrainosus: Secondary | ICD-10-CM

## 2022-07-02 ENCOUNTER — Other Ambulatory Visit: Payer: Self-pay | Admitting: Internal Medicine

## 2022-07-02 DIAGNOSIS — I10 Essential (primary) hypertension: Secondary | ICD-10-CM

## 2022-07-02 NOTE — Telephone Encounter (Signed)
Requested Prescriptions  Pending Prescriptions Disp Refills  . losartan (COZAAR) 100 MG tablet [Pharmacy Med Name: LOSARTAN POTASSIUM 100 MG TAB] 90 tablet 0    Sig: TAKE 1 TABLET BY MOUTH EVERY DAY     Cardiovascular:  Angiotensin Receptor Blockers Failed - 07/02/2022  2:00 AM      Failed - Last BP in normal range    BP Readings from Last 1 Encounters:  03/08/22 (!) 135/100         Passed - Cr in normal range and within 180 days    Creatinine  Date Value Ref Range Status  06/15/2019 0.94 0.44 - 1.00 mg/dL Final  08/10/2017 150.1 20.0 - 300.0 mg/dL Final   Creatinine, Ser  Date Value Ref Range Status  01/08/2022 0.91 0.57 - 1.00 mg/dL Final         Passed - K in normal range and within 180 days    Potassium  Date Value Ref Range Status  01/08/2022 3.9 3.5 - 5.2 mmol/L Final         Passed - Patient is not pregnant      Passed - Valid encounter within last 6 months    Recent Outpatient Visits          5 months ago Type 2 diabetes mellitus with morbid obesity ([place])   [facility] Elsie Stain, MD   5 months ago Type 2 diabetes mellitus with morbid obesity ([place])   [place] [facility] Ladell Pier, MD   6 months ago Acute otitis media, unspecified otitis media type   [facility], Kriste Basque, NP   10 months ago Stressful life event affecting family   [facility], Deborah B, MD   1 year ago Essential hypertension   [place], Deborah B, MD      Future Appointments            In 2 weeks Hilty, Nadean Corwin, MD [facility], [place]   In 1 month Wynetta Emery Dalbert Batman, MD [place]

## 2022-07-05 ENCOUNTER — Encounter (HOSPITAL_BASED_OUTPATIENT_CLINIC_OR_DEPARTMENT_OTHER): Payer: Self-pay | Admitting: Emergency Medicine

## 2022-07-05 ENCOUNTER — Emergency Department (HOSPITAL_BASED_OUTPATIENT_CLINIC_OR_DEPARTMENT_OTHER): Payer: Medicare Other

## 2022-07-05 ENCOUNTER — Emergency Department (HOSPITAL_BASED_OUTPATIENT_CLINIC_OR_DEPARTMENT_OTHER)
Admission: EM | Admit: 2022-07-05 | Discharge: 2022-07-05 | Disposition: A | Discharge status: Home / Self Care | Payer: Medicare Other | Attending: Emergency Medicine | Admitting: Emergency Medicine

## 2022-07-05 ENCOUNTER — Other Ambulatory Visit: Payer: Self-pay

## 2022-07-05 DIAGNOSIS — M25562 Pain in left knee: Secondary | ICD-10-CM | POA: Diagnosis not present

## 2022-07-05 DIAGNOSIS — R531 Weakness: Secondary | ICD-10-CM | POA: Diagnosis not present

## 2022-07-05 DIAGNOSIS — Z79899 Other long term (current) drug therapy: Secondary | ICD-10-CM | POA: Insufficient documentation

## 2022-07-05 DIAGNOSIS — M25462 Effusion, left knee: Secondary | ICD-10-CM | POA: Diagnosis not present

## 2022-07-05 MED ORDER — MELOXICAM 7.5 MG PO TABS: 7.5000 mg | ORAL_TABLET | Freq: Every day | ORAL | 0 refills | Status: AC

## 2022-07-05 NOTE — Discharge Instructions (Signed)
You were seen today for left-sided knee pain.  Your x-rays revealed no fractures.  Please follow-up as discussed with the orthopedic provider.  You have been provided a knee brace which may be removed as needed for bathing or comfort.  The crutches are to be used to reduce weightbearing on the left knee until you are able to follow-up with orthopedics.

## 2022-07-05 NOTE — ED Notes (Signed)
Oxycodone 10/325 at 1200 with little relief

## 2022-07-05 NOTE — ED Triage Notes (Signed)
Pt arrives pov, to triage in wheelchair, c/o posterior knee pain with weakness x 1 week after riding bike at gym

## 2022-07-05 NOTE — ED Provider Notes (Signed)
[facility] EMERGENCY DEPARTMENT Provider Note   CSN: [id number] Arrival date & time: 07/05/22  1418     History  Chief Complaint  Patient presents with   Knee Pain    RICHETTA Randall is a 56 y.o. female.  The patient presents to the hospital complaining of left-sided knee pain.  The patient states that she began exercising more regularly over the past week utilizing exercise bike.  She use the bike on Monday and noticed some knee soreness after working out.  She then exercised on Thursday and again on Saturday with increasing pain and weakness in the left knee after each workout.  She decided to come to the emergency department for evaluation today.  She denies any known injury or trauma.  Denies any popping or feeling that the knee is giving out.  Complains of mild swelling, weakness to the left knee, and pain.   The patient does see Dr.Xu for orthopedic concerns.  Past medical history significant for low back pain, spondylolisthesis, anxiety, depression, hypertension, arthritis, prediabetes  HPI     Home Medications Prior to Admission medications   Medication Sig Start Date End Date Taking? Authorizing Provider  meloxicam (MOBIC) 7.5 MG tablet Take 1 tablet (7.5 mg total) by mouth daily for 15 days. 07/05/22 07/20/22 Yes Sarah Peng, PA-C  amoxicillin-clavulanate (AUGMENTIN) 875-125 MG tablet Take 1 tablet by mouth 2 (two) times daily. 01/27/22   Sarah Stain, MD  Cyanocobalamin (B-12) 5000 MCG CAPS Take 5,000 mcg by mouth every other day.    [provider]  diclofenac Sodium (VOLTAREN) 1 % GEL Apply 2 g topically 4 (four) times daily. 01/08/22   Sarah Pier, MD  esomeprazole (NEXIUM) 40 MG capsule TAKE 1 CAPSULE BY MOUTH EVERY DAY 05/27/22   Sarah Pier, MD  hydrochlorothiazide (HYDRODIURIL) 25 MG tablet TAKE 1 TABLET BY MOUTH EVERY DAY 07/01/22   Sarah Pier, MD  lidocaine (LIDODERM) 5 % Place 1 patch onto the skin daily. Remove & Discard  patch within 12 hours or as directed by MD 03/08/22   Sarah Courier, PA-C  loratadine (CLARITIN) 10 MG tablet TAKE 1 TABLET BY MOUTH EVERY DAY AS NEEDED FOR ALLERGY 08/14/21   Sarah Pier, MD  losartan (COZAAR) 100 MG tablet TAKE 1 TABLET BY MOUTH EVERY DAY 07/02/22   Sarah Pier, MD  metFORMIN (GLUCOPHAGE-XR) 500 MG 24 hr tablet Take 1 tablet (500 mg total) by mouth daily with breakfast. 01/27/22   Sarah Stain, MD  methocarbamol (ROBAXIN) 500 MG tablet Take 1 tablet (500 mg total) by mouth 2 (two) times daily. 03/08/22   Sarah Courier, PA-C  naproxen (NAPROSYN) 375 MG tablet Take 1 tablet (375 mg total) by mouth 2 (two) times daily. 03/08/22   Sarah Courier, PA-C  nitroGLYCERIN (NITROSTAT) 0.4 MG SL tablet Place 1 tablet (0.4 mg total) under the tongue every 5 (five) minutes as needed. 10/21/18 12/22/19  Revankar, Reita Cliche, MD  propranolol (INDERAL) 10 MG tablet TAKE 1 TABLET BY MOUTH TWICE A DAY 07/01/22   Sarah Pier, MD      Allergies    Pregabalin, Cymbalta [duloxetine hcl], Escitalopram, Gabapentin, Lipitor [atorvastatin], Pravachol [pravastatin], and Zoloft [sertraline hcl]    Review of Systems   Review of Systems  Musculoskeletal:  Positive for arthralgias.    Physical Exam Updated Vital Signs BP (!) 126/92 (BP Location: Left Arm)   Pulse 72   Temp 97.9 F (36.6 C) (Oral)  Resp 18   Ht '5\' 4"'$  (1.626 m)   Wt 125.2 kg   LMP 10/10/2019   SpO2 99%   BMI 47.38 kg/m  Physical Exam Vitals and nursing note reviewed.  Constitutional:      General: She is not in acute distress.    Appearance: She is obese.  HENT:     Head: Normocephalic and atraumatic.     Mouth/Throat:     Mouth: Mucous membranes are moist.  Eyes:     Conjunctiva/sclera: Conjunctivae normal.  Cardiovascular:     Rate and Rhythm: Normal rate and regular rhythm.     Pulses: Normal pulses.     Heart sounds: Normal heart sounds.  Pulmonary:     Effort: Pulmonary effort is normal.     Breath sounds:  Normal breath sounds.  Musculoskeletal:        General: Swelling and tenderness present. Normal range of motion.     Cervical back: Normal range of motion and neck supple.     Comments: Minimal swelling and generalized tenderness to palpation of the left knee.  Grossly normal range of motion  Skin:    General: Skin is warm and dry.     Capillary Refill: Capillary refill takes less than 2 seconds.  Neurological:     Mental Status: She is alert.     ED Results / Procedures / Treatments   Labs (all labs ordered are listed, but only abnormal results are displayed) Labs Reviewed - No data to display  EKG None  Radiology DG Knee Complete 4 Views Left  Result Date: 07/05/2022 CLINICAL DATA:  Posterior knee pain with weakness for 1 week after riding bike at the gym. EXAM: LEFT KNEE - COMPLETE 4+ VIEW COMPARISON:  Left knee radiograph 07/18/2020. FINDINGS: No evidence of fracture or dislocation. Mild tricompartmental osteoarthritis. No other focal bone abnormality. Small suprapatellar joint effusion. IMPRESSION: 1. No acute osseous abnormality. 2. Small suprapatellar joint effusion. 3. Mild tricompartmental osteoarthritis. Electronically Signed   By: Sarah Randall M.D.   On: 07/05/2022 15:21    Procedures Procedures    Medications Ordered in ED Medications - No data to display  ED Course/ Medical Decision Making/ A&P                           Medical Decision Making Amount and/or Complexity of Data Reviewed Radiology: ordered.  Risk Prescription drug management.   Patient presents with a chief complaint of left knee pain.  Differential includes but is not limited to fracture, dislocation, soft tissue injury, contusion, and others  I reviewed the patient's past medical history and see recent visits for trigger finger and cervical radiculopathy.  Also see recent visits for elevated BMI  I ordered and interpreted imaging including plain films of the left knee 1. No acute osseous  abnormality. 2. Small suprapatellar joint effusion. 3. Mild tricompartmental osteoarthritis  I agree with the radiologist findings  This does not appear to be a fracture or dislocation.  This may be due to overuse, or possibly a ligamentous or soft tissue injury.  I have ordered the patient a knee brace and crutches.  She will reduce weightbearing of the left knee.  I have also prescribed a short course of meloxicam.  Patient will call and follow-up with Dr. Phoebe Sharps office for further care and management        Final Clinical Impression(s) / ED Diagnoses Final diagnoses:  Left knee pain, unspecified chronicity  Rx / DC Orders ED Discharge Orders          Ordered    meloxicam (MOBIC) 7.5 MG tablet  Daily        07/05/22 1654              Ronny Bacon 07/05/22 1839    Jeanell Sparrow, DO 07/07/22 0013

## 2022-07-07 ENCOUNTER — Ambulatory Visit (INDEPENDENT_AMBULATORY_CARE_PROVIDER_SITE_OTHER): Payer: Medicare Other | Admitting: Orthopaedic Surgery

## 2022-07-07 DIAGNOSIS — M25562 Pain in left knee: Secondary | ICD-10-CM

## 2022-07-07 NOTE — Progress Notes (Signed)
Office Visit Note   Patient: Sarah Randall           Date of Birth: [birth year]           MRN: [id number] Visit Date: 07/07/2022              Requested by: Sarah Pier, MD [address],  [place] [address] PCP: Sarah Pier, MD   Assessment & Plan: Visit Diagnoses:  1. Acute pain of left knee     Plan: Impression is left knee pain concerning for acute medial meniscal tear from injury last week.  Will need MRI to assess for this.  Hinged knee brace provided.  Follow-up after the MRI.  Follow-Up Instructions: No follow-ups on file.   Orders:  No orders of the defined types were placed in this encounter.  No orders of the defined types were placed in this encounter.     Procedures: No procedures performed   Clinical Data: No additional findings.   Subjective: Chief Complaint  Patient presents with   Left Knee - Pain    HPI Sarah Randall comes in for cute onset of left knee pain.  Was at the gym on Monday and was riding stationary bike and was injured when she felt a pop in the knee.  She has been unable to weight-bear since then.  Went to the ED this weekend and had x-rays that were negative for fracture.  Review of Systems  Constitutional: Negative.   HENT: Negative.    Eyes: Negative.   Respiratory: Negative.    Cardiovascular: Negative.   Endocrine: Negative.   Musculoskeletal: Negative.   Neurological: Negative.   Hematological: Negative.   Psychiatric/Behavioral: Negative.    All other systems reviewed and are negative.    Objective: Vital Signs: LMP 10/10/2019   Physical Exam Vitals and nursing note reviewed.  Constitutional:      Appearance: She is well-developed.  HENT:     Head: Atraumatic.     Nose: Nose normal.  Eyes:     Extraocular Movements: Extraocular movements intact.  Cardiovascular:     Pulses: Normal pulses.  Pulmonary:     Effort: Pulmonary effort is normal.  Abdominal:     Palpations: Abdomen is  soft.  Musculoskeletal:     Cervical back: Neck supple.  Skin:    General: Skin is warm.     Capillary Refill: Capillary refill takes less than 2 seconds.  Neurological:     Mental Status: She is alert. Mental status is at baseline.  Psychiatric:        Behavior: Behavior normal.        Thought Content: Thought content normal.        Judgment: Judgment normal.     Ortho Exam Examination left knee shows a joint effusion.  Medial joint line tenderness and tenderness in the popliteal fossa.  Pain with McMurray's test.  Collaterals and cruciates are stable. Specialty Comments:  No specialty comments available.  Imaging: No results found.   PMFS History: Patient Active Problem List   Diagnosis Date Noted   Recurrent sinusitis 01/27/2022   Trigger index finger of right hand 01/27/2022   Acute otitis media 12/23/2021   Multiple joint pain 02/10/2021   Positive ANA (antinuclear antibody) 12/13/2020   Trigger finger of left thumb 12/10/2020   Lumbar radiculopathy 11/07/2020   Chronic right-sided low back pain with right-sided sciatica 10/10/2020   Sleep disturbance 10/10/2020   Myalgia 10/10/2020  Super obese 10/10/2020   Abnormality of gait 10/10/2020   Statin intolerance 09/17/2020   Recurrent boils 09/17/2020   Chronic bilateral low back pain 05/16/2020   Postoperative state 12/27/2019   Iron deficiency 08/03/2019   B12 deficiency 06/30/2019   Chronic right shoulder pain 07/05/2018   Trochanteric bursitis of left hip 07/05/2018   Morbid obesity ([place]) 07/05/2018   Body mass index 45.0-49.9, adult ([place]) 07/05/2018   Marijuana user 05/26/2018   Status post total right knee replacement 01/12/2018   Status post total hip replacement, left 09/22/2017   Hyperlipidemia 08/10/2017   Mild depression 06/25/2017   Essential hypertension 06/25/2017   Tobacco abuse 06/25/2017   Prediabetes 06/25/2017   OSA on CPAP 06/25/2017   Bronchitis 09/24/2016   Leukocytosis 09/24/2016    Radiculopathy, lumbar region 07/16/2016   Degenerative lumbar disc 07/03/2016   Synovitis of hip 04/20/2016   Screening for breast cancer 12/28/2014   Type 2 diabetes mellitus with obesity ([place]) 04/06/2014   Pain in joint involving lower leg 05/13/2013   Past Medical History:  Diagnosis Date   Anemia    Anxiety    Arthritis    B12 deficiency 06/30/2019   Depression    GERD (gastroesophageal reflux disease)    occasionally takes OTC   History of leukocytosis    Hyperlipidemia    Hypertension    Low back pain    Obesity    Pain in right buttock    Goes down to Right leg to lateral aspect of calf.   Pre-diabetes    Sleep apnea    tested 2014 - unable to tolerate machine   Spondylolisthesis at L4-L5 level     Family History  Problem Relation Age of Onset   Hypertension Mother    Diabetes Mother    Breast cancer Mother 71   Hypertension Father    Lung cancer Maternal Aunt    Colon polyps Brother    Colon cancer Neg Hx    Esophageal cancer Neg Hx    Rectal cancer Neg Hx    Stomach cancer Neg Hx     Past Surgical History:  Procedure Laterality Date   ABDOMINAL EXPOSURE N/A 01/22/2021   Procedure: LATERAL EXPOSURE FOR OBLIQUE LATERAL INTERBODY FUSION;  Surgeon: Marty Heck, MD;  Location: [facility];  Service: Vascular;  Laterality: N/A;   ABDOMINAL HYSTERECTOMY     ANTERIOR LUMBAR FUSION N/A 01/22/2021   Procedure: Oblique Lateral Interbody Fusion Lumbar Four-Lumbar Five;  Surgeon: Vallarie Mare, MD;  Location: [facility];  Service: Neurosurgery;  Laterality: N/A;   LEFT HEART CATH AND CORONARY ANGIOGRAPHY N/A 10/25/2018   Procedure: LEFT HEART CATH AND CORONARY ANGIOGRAPHY;  Surgeon: Martinique, Peter M, MD;  Location: [facility];  Service: Cardiovascular;  Laterality: N/A;   LUMBAR PERCUTANEOUS PEDICLE SCREW 1 LEVEL N/A 01/22/2021   Procedure: LUMBAR PERCUTANEOUS PEDICLE SCREW LUBAR FOUR- FIVE;  Surgeon: Vallarie Mare, MD;  Location: [facility];  Service:  Neurosurgery;  Laterality: N/A;   ROBOTIC ASSISTED LAPAROSCOPIC HYSTERECTOMY AND SALPINGECTOMY Bilateral 12/27/2019   Procedure: XI ROBOTIC ASSISTED TOTAL LAPAROSCOPIC HYSTERECTOMY AND SALPINGECTOMY;  Surgeon: Princess Bruins, MD;  Location: [facility];  Service: Gynecology;  Laterality: Bilateral;   TOTAL HIP ARTHROPLASTY Left 09/22/2017   Procedure: LEFT TOTAL HIP ARTHROPLASTY ANTERIOR APPROACH;  Surgeon: Leandrew Koyanagi, MD;  Location: [facility];  Service: Orthopedics;  Laterality: Left;   TOTAL KNEE ARTHROPLASTY Right 01/12/2018   Procedure: RIGHT TOTAL KNEE ARTHROPLASTY;  Surgeon:  Leandrew Koyanagi, MD;  Location: [facility];  Service: Orthopedics;  Laterality: Right;   TUBAL LIGATION     Social History   Occupational History   Occupation: unemployed  Tobacco Use   Smoking status: Every Day    Packs/day: 0.25    Years: 30.00    Total pack years: 7.50    Types: Cigarettes   Smokeless tobacco: Never  Vaping Use   Vaping Use: Never used  Substance and Sexual Activity   Alcohol use: Yes    Alcohol/week: 4.0 - 6.0 standard drinks of alcohol    Types: 4 - 6 Shots of liquor per week    Comment: occ   Drug use: Yes    Frequency: 2.0 times per week    Types: Marijuana   Sexual activity: Yes    Birth control/protection: None    Comment: declined insurance questions,des neg

## 2022-07-15 ENCOUNTER — Telehealth: Payer: Self-pay | Admitting: *Deleted

## 2022-07-15 NOTE — Telephone Encounter (Signed)
     Patient  visit on 07/06/2022 at [facility]  was for knee apin  Have you been able to follow up with your primary care physician? yes  The patient was  able to obtain any needed medicine or equipment.  Are there diet recommendations that you are having difficulty following? NA  Patient expresses understanding of discharge instructions and education provided has no other needs at this time.    [facility] [phone] [address] , [place] [address] Email : [email or web address]

## 2022-07-17 ENCOUNTER — Encounter (HOSPITAL_BASED_OUTPATIENT_CLINIC_OR_DEPARTMENT_OTHER): Payer: Self-pay | Admitting: Internal Medicine

## 2022-07-17 ENCOUNTER — Ambulatory Visit (INDEPENDENT_AMBULATORY_CARE_PROVIDER_SITE_OTHER): Payer: Medicare Other | Admitting: Internal Medicine

## 2022-07-17 VITALS — BP 120/72 | HR 90 | Ht 64.0 in | Wt 294.2 lb

## 2022-07-17 DIAGNOSIS — Z8249 Family history of ischemic heart disease and other diseases of the circulatory system: Secondary | ICD-10-CM

## 2022-07-17 DIAGNOSIS — T466X5A Adverse effect of antihyperlipidemic and antiarteriosclerotic drugs, initial encounter: Secondary | ICD-10-CM

## 2022-07-17 DIAGNOSIS — M791 Myalgia, unspecified site: Secondary | ICD-10-CM | POA: Diagnosis not present

## 2022-07-17 DIAGNOSIS — E7849 Other hyperlipidemia: Secondary | ICD-10-CM

## 2022-07-17 DIAGNOSIS — E782 Mixed hyperlipidemia: Secondary | ICD-10-CM

## 2022-07-17 MED ORDER — REPATHA SURECLICK 140 MG/ML ~~LOC~~ SOAJ: 1.0000 | SUBCUTANEOUS | 11 refills | Status: DC

## 2022-07-17 NOTE — Patient Instructions (Addendum)
Medication Instructions:  Dr. Debara Pickett recommends Repatha '140mg'$ /mL (PCSK9). This is an injectable cholesterol medication self-administered once every 14 days. This medication will likely need prior approval with your insurance company, which we will work on. If the medication is not approved initially, we may need to do an appeal with your insurance.   Administer medication in area of fatty tissue such as abdomen, outer thigh, back of upper arm - and rotate site with each injection Store medication in refrigerator until ready to administer - allow to sit at room temp for 30 mins - 1 hour prior to injection Dispose of medication in a SHARPS container - your pharmacy should be able to direct you on this and proper disposal    Patient Assistance:    These foundations have funds at various times.   The [employer]: https://www.panfoundation.org/disease-funds/hypercholesterolemia/ -- can sign up for wait list  The [facility] offers assistance to help pay for medication copays.  They will cover copays for all cholesterol lowering meds, including statins, fibrates, omega-3 fish oils like Vascepa, ezetimibe, Repatha, Praluent, Nexletol, Nexlizet.  The cards are usually good for $2,500 or 12 months, whichever comes first. Go to healthwellfoundation.org Click on "Apply Now" Answer questions as to whom is applying (patient or representative) Your disease fund will be "hypercholesterolemia - Medicare access" They will ask questions about finances and which medications you are taking for cholesterol When you submit, the approval is usually within minutes.  You will need to print the card information from the site You will need to show this information to your pharmacy, they will bill your Medicare Part D plan first -then bill [employer] --for the copay.   You can also call them at [phone], although the hold times can be quite long.     *If you need a refill on your cardiac medications  before your next appointment, please call your pharmacy*   Lab Work: FASTING lab work to check cholesterol in 3-4 months -- complete about 1 week before next visit with Dr. Debara Pickett   If you have labs (blood work) drawn today and your tests are completely normal, you will receive your results only by: [facility] (if you have MyChart) OR A paper copy in the mail If you have any lab test that is abnormal or we need to change your treatment, we will call you to review the results.   Testing/Procedures: NONE   Follow-Up: At [facility] you and your health needs are our priority.  As part of our continuing mission to provide you with exceptional heart care, we have created designated Provider Care Teams.  These Care Teams include your primary Cardiologist (physician) and Advanced Practice Providers (APPs -  Physician Assistants and Nurse Practitioners) who all work together to provide you with the care you need, when you need it.  We recommend signing up for the patient portal called "MyChart".  Sign up information is provided on this After Visit Summary.  MyChart is used to connect with patients for Virtual Visits (Telemedicine).  Patients are able to view lab/test results, encounter notes, upcoming appointments, etc.  Non-urgent messages can be sent to your provider as well.   To learn more about what you can do with MyChart, go to [email or web address].    Your next appointment:   3-4 months with Dr. Debara Pickett -- lipid clinic

## 2022-07-18 ENCOUNTER — Ambulatory Visit
Admission: RE | Admit: 2022-07-18 | Discharge: 2022-07-18 | Disposition: A | Discharge status: Home / Self Care | Payer: Medicare Other | Source: Ambulatory Visit | Attending: Orthopaedic Surgery | Admitting: Orthopaedic Surgery

## 2022-07-18 DIAGNOSIS — M25562 Pain in left knee: Secondary | ICD-10-CM

## 2022-07-20 ENCOUNTER — Telehealth: Payer: Self-pay

## 2022-07-20 NOTE — Telephone Encounter (Signed)
PA submitted by [facility], Key:  [id number], Rx #: [id number].

## 2022-07-20 NOTE — Progress Notes (Signed)
LIPID CLINIC CONSULT NOTE  Chief Complaint:  Manage dyslipidemia  Primary Care Physician: Beverley Fiedler, FNP  Primary Cardiologist:  None  HPI:  Sarah Randall is a 56 y.o. female who is being seen today for the evaluation of dyslipidemia at the request of Ladell Pier, MD. this is a pleasant 56 year old female kindly referred for evaluation management of dyslipidemia.  She has a family history of heart disease including her father who had a stent she has been intolerant to statins causing significant myalgias both on atorvastatin, rosuvastatin and pravastatin.  Most recently her lipid profile showed total cholesterol of 339, triglycerides 175, HDL 40 and LDL 264.  This is highly concerning for a familial hyperlipidemia.  She does report a greater than normal amount of saturated fats in her diet.  She has been less active due to numerous orthopedic issues including left knee pain.  PMHx:  Past Medical History:  Diagnosis Date   Anemia    Anxiety    Arthritis    B12 deficiency 06/30/2019   Depression    GERD (gastroesophageal reflux disease)    occasionally takes OTC   History of leukocytosis    Hyperlipidemia    Hypertension    Low back pain    Obesity    Pain in right buttock    Goes down to Right leg to lateral aspect of calf.   Pre-diabetes    Sleep apnea    tested 2014 - unable to tolerate machine   Spondylolisthesis at L4-L5 level     Past Surgical History:  Procedure Laterality Date   ABDOMINAL EXPOSURE N/A 01/22/2021   Procedure: LATERAL EXPOSURE FOR OBLIQUE LATERAL INTERBODY FUSION;  Surgeon: Marty Heck, MD;  Location: [facility] OR;  Service: Vascular;  Laterality: N/A;   ABDOMINAL HYSTERECTOMY     ANTERIOR LUMBAR FUSION N/A 01/22/2021   Procedure: Oblique Lateral Interbody Fusion Lumbar Four-Lumbar Five;  Surgeon: Vallarie Mare, MD;  Location: [place];  Service: Neurosurgery;  Laterality: N/A;   LEFT HEART CATH AND CORONARY ANGIOGRAPHY N/A  10/25/2018   Procedure: LEFT HEART CATH AND CORONARY ANGIOGRAPHY;  Surgeon: Martinique, Peter M, MD;  Location: [place] CV LAB;  Service: Cardiovascular;  Laterality: N/A;   LUMBAR PERCUTANEOUS PEDICLE SCREW 1 LEVEL N/A 01/22/2021   Procedure: LUMBAR PERCUTANEOUS PEDICLE SCREW LUBAR FOUR- FIVE;  Surgeon: Vallarie Mare, MD;  Location: [place];  Service: Neurosurgery;  Laterality: N/A;   ROBOTIC ASSISTED LAPAROSCOPIC HYSTERECTOMY AND SALPINGECTOMY Bilateral 12/27/2019   Procedure: XI ROBOTIC ASSISTED TOTAL LAPAROSCOPIC HYSTERECTOMY AND SALPINGECTOMY;  Surgeon: Princess Bruins, MD;  Location: [place];  Service: Gynecology;  Laterality: Bilateral;   TOTAL HIP ARTHROPLASTY Left 09/22/2017   Procedure: LEFT TOTAL HIP ARTHROPLASTY ANTERIOR APPROACH;  Surgeon: Leandrew Koyanagi, MD;  Location: [place];  Service: Orthopedics;  Laterality: Left;   TOTAL KNEE ARTHROPLASTY Right 01/12/2018   Procedure: RIGHT TOTAL KNEE ARTHROPLASTY;  Surgeon: Leandrew Koyanagi, MD;  Location: [place];  Service: Orthopedics;  Laterality: Right;   TUBAL LIGATION      FAMHx:  Family History  Problem Relation Age of Onset   Hypertension Mother    Diabetes Mother    Breast cancer Mother 53   Hypertension Father    Lung cancer Maternal Aunt    Colon polyps Brother    Colon cancer Neg Hx    Esophageal cancer Neg Hx    Rectal cancer Neg Hx    Stomach cancer Neg Hx  SOCHx:   reports that she has been smoking cigarettes. She has a 7.50 pack-year smoking history. She has never used smokeless tobacco. She reports current alcohol use of about 4.0 - 6.0 standard drinks of alcohol per week. She reports current drug use. Frequency: 2.00 times per week. Drug: Marijuana.  ALLERGIES:  Allergies  Allergen Reactions   Pregabalin Swelling   Cymbalta [Duloxetine Hcl] Nausea Only   Escitalopram     Made her feel out of it.   Gabapentin Swelling   Lipitor [Atorvastatin] Other (See Comments)    Stabbing pains in legs    Pravachol [Pravastatin]     Sharp pains in LT leg   Zoloft [Sertraline Hcl] Other (See Comments)    tremors    ROS: Pertinent items noted in HPI and remainder of comprehensive ROS otherwise negative.  HOME MEDS: Current Outpatient Medications on File Prior to Visit  Medication Sig Dispense Refill   Cyanocobalamin (B-12) 5000 MCG CAPS Take 5,000 mcg by mouth every other day.     diclofenac Sodium (VOLTAREN) 1 % GEL Apply 2 g topically 4 (four) times daily. 100 g 1   esomeprazole (NEXIUM) 40 MG capsule TAKE 1 CAPSULE BY MOUTH EVERY DAY 90 capsule 0   ezetimibe (ZETIA) 10 MG tablet Take 10 mg by mouth every other day.     hydrochlorothiazide (HYDRODIURIL) 25 MG tablet TAKE 1 TABLET BY MOUTH EVERY DAY 90 tablet 0   lidocaine (LIDODERM) 5 % Place 1 patch onto the skin daily. Remove & Discard patch within 12 hours or as directed by MD 30 patch 0   loratadine (CLARITIN) 10 MG tablet TAKE 1 TABLET BY MOUTH EVERY DAY AS NEEDED FOR ALLERGY 90 tablet 1   losartan (COZAAR) 100 MG tablet TAKE 1 TABLET BY MOUTH EVERY DAY 90 tablet 0   meloxicam (MOBIC) 7.5 MG tablet Take 1 tablet (7.5 mg total) by mouth daily for 15 days. 15 tablet 0   metFORMIN (GLUCOPHAGE-XR) 500 MG 24 hr tablet Take 1 tablet (500 mg total) by mouth daily with breakfast. 60 tablet 2   methocarbamol (ROBAXIN) 500 MG tablet Take 1 tablet (500 mg total) by mouth 2 (two) times daily. 20 tablet 0   nitroGLYCERIN (NITROSTAT) 0.4 MG SL tablet Place 1 tablet (0.4 mg total) under the tongue every 5 (five) minutes as needed. 25 tablet 11   oxyCODONE-acetaminophen (PERCOCET) 10-325 MG tablet Take 1 tablet by mouth every 6 (six) hours.     pregabalin (LYRICA) 75 MG capsule Take 75 mg by mouth 2 (two) times daily.     propranolol (INDERAL) 10 MG tablet TAKE 1 TABLET BY MOUTH TWICE A DAY 180 tablet 0   No current facility-administered medications on file prior to visit.    LABS/IMAGING: No results found for this or any previous visit (from  the past 48 hour(s)). No results found.  LIPID PANEL:    Component Value Date/Time   CHOL 339 (H) 01/08/2022 1218   TRIG 175 (H) 01/08/2022 1218   HDL 40 01/08/2022 1218   CHOLHDL 8.5 (H) 01/08/2022 1218   LDLCALC 264 (H) 01/08/2022 1218    WEIGHTS: Wt Readings from Last 3 Encounters:  07/17/22 294 lb 3.2 oz (133.4 kg)  07/05/22 276 lb (125.2 kg)  01/27/22 279 lb 12.8 oz (126.9 kg)    VITALS: BP 120/72   Pulse 90   Ht '5\' 4"'$  (1.626 m)   Wt 294 lb 3.2 oz (133.4 kg)   LMP 10/10/2019   SpO2 98%  BMI 50.50 kg/m   EXAM: General appearance: alert and no distress Neck: no carotid bruit, no JVD, and thyroid not enlarged, symmetric, no tenderness/mass/nodules Lungs: clear to auscultation bilaterally Heart: regular rate and rhythm, S1, S2 normal, no murmur, click, rub or gallop Abdomen: soft, non-tender; bowel sounds normal; no masses,  no organomegaly Extremities: extremities normal, atraumatic, no cyanosis or edema Pulses: 2+ and symmetric Skin: Skin color, texture, turgor normal. No rashes or lesions Neurologic: Grossly normal Psych: Pleasant  EKG: Deferred  ASSESSMENT: Probable familial hyperlipidemia Family history of premature coronary disease in her father who had a stent Cardiac catheterization in 2019 that revealed no significant coronary disease Statin myalgia  PLAN: 1.   Ms. Wirsing has probable familial hyperlipidemia although had no angiographic coronary artery disease by cath in 2019.  She could very well however have extensive disease in the wall of the blood vessels that is not noted by catheterization.  I would still recommend at least 50% reduction in her LDL cholesterol to try to target LDL less than 70.  This is also based on family history of heart disease which was premature in her father.  She has been placed on Zetia however her cholesterol remains elevated.  She will need additional therapy for more than 50% reduction.  Would recommend adding a PCSK9  inhibitor.  Recheck a lipid NMR and LP(a) in 3 to 4 months and follow-up with me at that time.  Thanks again for the consultation.  Pixie Casino, MD, [facility], [place], [place] Director of the Advanced Lipid Disorders &  Cardiovascular Risk Reduction Clinic Diplomate of the American Board of Clinical Lipidology Attending Cardiologist  Direct Dial: [phone]  Fax: [phone]  Website:  [email or web address] 07/20/2022, 8:45 PM

## 2022-07-20 NOTE — Telephone Encounter (Signed)
[id number] for Repatha 140 mg/ml is approved through 01/20/2023.

## 2022-07-21 ENCOUNTER — Encounter: Payer: Self-pay | Admitting: Orthopaedic Surgery

## 2022-07-21 ENCOUNTER — Ambulatory Visit (INDEPENDENT_AMBULATORY_CARE_PROVIDER_SITE_OTHER): Payer: Medicare Other | Admitting: Orthopaedic Surgery

## 2022-07-21 DIAGNOSIS — S83242A Other tear of medial meniscus, current injury, left knee, initial encounter: Secondary | ICD-10-CM | POA: Diagnosis not present

## 2022-07-21 NOTE — Progress Notes (Signed)
Office Visit Note   Patient: Sarah Randall           Date of Birth: 31-Oct-1966           MRN: [id number] Visit Date: 07/21/2022              Requested by: Sarah Pier, MD Sarah Randall,  Sarah Randall [id number] PCP: Sarah Fiedler, FNP   Assessment & Plan: Visit Diagnoses:  1. Acute medial meniscus tear of left knee, initial encounter     Plan: Sarah Randall returns today to discuss left knee MRI scan.  Examination of the left knee is unchanged.  MRI of the left knee shows complex tear of the medial meniscus with extrusion.  There is moderate synovitis and possible lipoma arborescens.  Moderate Baker's cyst as well.  Mild tricompartmental chondromalacia.  Based on these findings I have recommended partial medial meniscectomy and synovectomy.  Risk benefits prognosis reviewed.  They met with Sarah Randall today.  Follow-Up Instructions: No follow-ups on file.   Orders:  No orders of the defined types were placed in this encounter.  No orders of the defined types were placed in this encounter.     Procedures: No procedures performed   Clinical Data: No additional findings.   Subjective: Chief Complaint  Patient presents with   Left Knee - Follow-up    MRI review    HPI  Review of Systems   Objective: Vital Signs: LMP 10/10/2019   Physical Exam  Ortho Exam  Specialty Comments:  No specialty comments available.  Imaging: No results found.   PMFS History: Patient Active Problem List   Diagnosis Date Noted   Acute medial meniscus tear of left knee 07/21/2022   Recurrent sinusitis 01/27/2022   Trigger index finger of right hand 01/27/2022   Acute otitis media 12/23/2021   Multiple joint pain 02/10/2021   Positive ANA (antinuclear antibody) 12/13/2020   Trigger finger of left thumb 12/10/2020   Lumbar radiculopathy 11/07/2020   Chronic right-sided low back pain with right-sided sciatica 10/10/2020   Sleep disturbance 10/10/2020    Myalgia 10/10/2020   Super obese 10/10/2020   Abnormality of gait 10/10/2020   Statin intolerance 09/17/2020   Recurrent boils 09/17/2020   Chronic bilateral low back pain 05/16/2020   Postoperative state 12/27/2019   Iron deficiency 08/03/2019   B12 deficiency 06/30/2019   Chronic right shoulder pain 07/05/2018   Trochanteric bursitis of left hip 07/05/2018   Morbid obesity ([place]) 07/05/2018   Body mass index 45.0-49.9, adult ([place]) 07/05/2018   Marijuana user 05/26/2018   Status post total right knee replacement 01/12/2018   Status post total hip replacement, left 09/22/2017   Hyperlipidemia 08/10/2017   Mild depression 06/25/2017   Essential hypertension 06/25/2017   Tobacco abuse 06/25/2017   Prediabetes 06/25/2017   OSA on CPAP 06/25/2017   Bronchitis 09/24/2016   Leukocytosis 09/24/2016   Radiculopathy, lumbar region 07/16/2016   Degenerative lumbar disc 07/03/2016   Synovitis of hip 04/20/2016   Screening for breast cancer 12/28/2014   Type 2 diabetes mellitus with obesity ([place]) 04/06/2014   Pain in joint involving lower leg 05/13/2013   Past Medical History:  Diagnosis Date   Anemia    Anxiety    Arthritis    B12 deficiency 06/30/2019   Depression    GERD (gastroesophageal reflux disease)    occasionally takes OTC   History of leukocytosis    Hyperlipidemia    Hypertension  Low back pain    Obesity    Pain in right buttock    Goes down to Right leg to lateral aspect of calf.   Pre-diabetes    Sleep apnea    tested 2014 - unable to tolerate machine   Spondylolisthesis at L4-L5 level     Family History  Problem Relation Age of Onset   Hypertension Mother    Diabetes Mother    Breast cancer Mother 52   Hypertension Father    Lung cancer Maternal Aunt    Colon polyps Brother    Colon cancer Neg Hx    Esophageal cancer Neg Hx    Rectal cancer Neg Hx    Stomach cancer Neg Hx     Past Surgical History:  Procedure Laterality Date   ABDOMINAL EXPOSURE  N/A 01/22/2021   Procedure: LATERAL EXPOSURE FOR OBLIQUE LATERAL INTERBODY FUSION;  Surgeon: Sarah Heck, MD;  Location: [place] OR;  Service: Vascular;  Laterality: N/A;   ABDOMINAL HYSTERECTOMY     ANTERIOR LUMBAR FUSION N/A 01/22/2021   Procedure: Oblique Lateral Interbody Fusion Lumbar Four-Lumbar Five;  Surgeon: Sarah Mare, MD;  Location: [place];  Service: Neurosurgery;  Laterality: N/A;   LEFT HEART CATH AND CORONARY ANGIOGRAPHY N/A 10/25/2018   Procedure: LEFT HEART CATH AND CORONARY ANGIOGRAPHY;  Surgeon: Martinique, Peter M, MD;  Location: [place] [facility] LAB;  Service: Cardiovascular;  Laterality: N/A;   LUMBAR PERCUTANEOUS PEDICLE SCREW 1 LEVEL N/A 01/22/2021   Procedure: LUMBAR PERCUTANEOUS PEDICLE SCREW LUBAR FOUR- FIVE;  Surgeon: Sarah Mare, MD;  Location: [place];  Service: Neurosurgery;  Laterality: N/A;   ROBOTIC ASSISTED LAPAROSCOPIC HYSTERECTOMY AND SALPINGECTOMY Bilateral 12/27/2019   Procedure: XI ROBOTIC ASSISTED TOTAL LAPAROSCOPIC HYSTERECTOMY AND SALPINGECTOMY;  Surgeon: Sarah Bruins, MD;  Location: [place];  Service: Gynecology;  Laterality: Bilateral;   TOTAL HIP ARTHROPLASTY Left 09/22/2017   Procedure: LEFT TOTAL HIP ARTHROPLASTY ANTERIOR APPROACH;  Surgeon: Sarah Koyanagi, MD;  Location: [place];  Service: Orthopedics;  Laterality: Left;   TOTAL KNEE ARTHROPLASTY Right 01/12/2018   Procedure: RIGHT TOTAL KNEE ARTHROPLASTY;  Surgeon: Sarah Koyanagi, MD;  Location: [place];  Service: Orthopedics;  Laterality: Right;   TUBAL LIGATION     Social History   Occupational History   Occupation: unemployed  Tobacco Use   Smoking status: Every Day    Packs/day: 0.25    Years: 30.00    Total pack years: 7.50    Types: Cigarettes   Smokeless tobacco: Never  Vaping Use   Vaping Use: Never used  Substance and Sexual Activity   Alcohol use: Yes    Alcohol/week: 4.0 - 6.0 standard drinks of alcohol    Types: 4 - 6 Shots of liquor per week     Comment: occ   Drug use: Yes    Frequency: 2.0 times per week    Types: Marijuana   Sexual activity: Yes    Birth control/protection: None    Comment: declined insurance questions,des neg

## 2022-07-22 ENCOUNTER — Ambulatory Visit: Payer: Medicaid - Dental | Admitting: Orthopaedic Surgery

## 2022-07-22 NOTE — Telephone Encounter (Signed)
Message sent to patient in [place] with update on med approval

## 2022-08-03 ENCOUNTER — Other Ambulatory Visit: Payer: Self-pay

## 2022-08-03 ENCOUNTER — Encounter (HOSPITAL_BASED_OUTPATIENT_CLINIC_OR_DEPARTMENT_OTHER): Payer: Self-pay | Admitting: Emergency Medicine

## 2022-08-03 ENCOUNTER — Emergency Department (HOSPITAL_BASED_OUTPATIENT_CLINIC_OR_DEPARTMENT_OTHER)
Admission: EM | Admit: 2022-08-03 | Discharge: 2022-08-03 | Discharge status: Against Medical Advice | Payer: Medicare Other | Attending: Emergency Medicine | Admitting: Emergency Medicine

## 2022-08-03 DIAGNOSIS — R0981 Nasal congestion: Secondary | ICD-10-CM | POA: Diagnosis present

## 2022-08-03 DIAGNOSIS — U071 COVID-19: Secondary | ICD-10-CM | POA: Insufficient documentation

## 2022-08-03 DIAGNOSIS — Z5321 Procedure and treatment not carried out due to patient leaving prior to being seen by health care provider: Secondary | ICD-10-CM | POA: Insufficient documentation

## 2022-08-03 LAB — RESP PANEL BY RT-PCR (FLU A&B, COVID) ARPGX2
Influenza A by PCR: NEGATIVE
Influenza B by PCR: NEGATIVE
SARS Coronavirus 2 by RT PCR: POSITIVE — AB

## 2022-08-03 NOTE — ED Triage Notes (Signed)
Generalized malaise, head congestion, intermittent fevers, mild cough, and left ear pain since Wednesday. Unrelieved with otc meds.

## 2022-08-04 NOTE — Progress Notes (Signed)
Called Debbie at Dr. Erlinda Hong office due to patient testing positive for covid and patient will need to be r/s for surgery until after 08/13/2022.

## 2022-08-11 ENCOUNTER — Encounter (HOSPITAL_BASED_OUTPATIENT_CLINIC_OR_DEPARTMENT_OTHER): Payer: Self-pay | Admitting: Orthopaedic Surgery

## 2022-08-13 ENCOUNTER — Encounter: Payer: Self-pay | Admitting: Pharmacist

## 2022-08-13 DIAGNOSIS — G720 Drug-induced myopathy: Secondary | ICD-10-CM

## 2022-08-13 NOTE — Progress Notes (Signed)
[facility])                                            [place] Team                                        Statin Quality Measure Assessment    08/13/2022  Sarah Randall 1966-07-20 [id number]   Per review of chart and payor information, patient has a diagnosis of diabetes but is not currently filling a statin prescription.  This places patient into the SUPD (Statin Use In Patients with Diabetes) measure for CMS.    Patient has documented myopathy with statins but does not have an "approved" statin exclusion code associated with a visit.  Z78.9 "statin intolerance" has been associated with several visits but Z78.9 is not an approved code and will not remove the patient from the measure.   She has an upcoming appointment on 08/14/22.  If deemed therapeutically appropriate, an "approved" statin exclusion code could be associated with the upcoming visit.  The ASCVD Risk score (Arnett DK, et al., 2019) failed to calculate for the following reasons:   The valid total cholesterol range is 130 to 320 mg/dL 01/08/2022     Component Value Date/Time   CHOL 339 (H) 01/08/2022 1218   TRIG 175 (H) 01/08/2022 1218   HDL 40 01/08/2022 1218   CHOLHDL 8.5 (H) 01/08/2022 1218   LDLCALC 264 (H) 01/08/2022 1218    Please consider ONE of the following recommendations:  Initiate high intensity statin Atorvastatin '40mg'$  once daily, #90, 3 refills   Rosuvastatin '20mg'$  once daily, #90, 3 refills    Initiate moderate intensity          statin with reduced frequency if prior          statin intolerance 1x weekly, #13, 3 refills   2x weekly, #26, 3 refills   3x weekly, #39, 3 refills    Code for past statin intolerance or  other exclusions (required annually)   Provider Requirements:  Associate code during an office visit or telehealth encounter  Drug Induced Myopathy G72.0   Myopathy, unspecified G72.9   Myositis, unspecified M60.9    Rhabdomyolysis L93.57   Alcoholic fatty liver S17.7   Cirrhosis of liver K74.69   Prediabetes R73.03   PCOS E28.2   Toxic liver disease, unspecified K71.9        Plan:  Route note to PCP prior to the appointment on 08/14/22.  Elayne Guerin, PharmD, [place] Clinical Pharmacist [phone]

## 2022-08-14 ENCOUNTER — Ambulatory Visit: Payer: Medicare Other | Admitting: Internal Medicine

## 2022-08-15 ENCOUNTER — Other Ambulatory Visit: Payer: Self-pay

## 2022-08-15 ENCOUNTER — Encounter (HOSPITAL_BASED_OUTPATIENT_CLINIC_OR_DEPARTMENT_OTHER): Payer: Self-pay

## 2022-08-15 ENCOUNTER — Emergency Department (HOSPITAL_BASED_OUTPATIENT_CLINIC_OR_DEPARTMENT_OTHER)
Admission: EM | Admit: 2022-08-15 | Discharge: 2022-08-15 | Disposition: A | Discharge status: Home / Self Care | Payer: Medicare Other | Attending: Emergency Medicine | Admitting: Emergency Medicine

## 2022-08-15 DIAGNOSIS — R0981 Nasal congestion: Secondary | ICD-10-CM | POA: Diagnosis present

## 2022-08-15 DIAGNOSIS — B9789 Other viral agents as the cause of diseases classified elsewhere: Secondary | ICD-10-CM

## 2022-08-15 DIAGNOSIS — J019 Acute sinusitis, unspecified: Secondary | ICD-10-CM | POA: Insufficient documentation

## 2022-08-15 MED ORDER — IPRATROPIUM BROMIDE 0.03 % NA SOLN: 2.0000 | Freq: Two times a day (BID) | NASAL | 0 refills | Status: DC

## 2022-08-15 MED ORDER — OXYMETAZOLINE HCL 0.05 % NA SOLN: 1.0000 | Freq: Two times a day (BID) | NASAL | 0 refills | Status: AC

## 2022-08-15 NOTE — ED Triage Notes (Signed)
Seen on 8/28 and was swabbed for covid/ flu but left due to wait times.   Tested positive.   Returns for ongoing nasal congestion.

## 2022-08-15 NOTE — Discharge Instructions (Addendum)
If you develop high fever, severe cough or cough with blood, trouble breathing, severe headache, neck pain/stiffness, vomiting, or any other new/concerning symptoms then return to the ER for evaluation  

## 2022-08-15 NOTE — ED Provider Notes (Signed)
[facility] EMERGENCY DEPARTMENT Provider Note   CSN: [id number] Arrival date & time: 08/15/22  7001     History  Chief Complaint  Patient presents with   Nasal Congestion    Sarah Randall is a 56 y.o. female.  HPI 56 year old female presents with nasal congestion and sinus congestion.  Originally started on 8/23.  She checked in here on 8/28 and had a positive COVID test but due to the long wait she did not stay to be evaluated.  She had continued congestion.  The worst of it such as the fevers are gone.  However she still has congestion and feels like it might be a little worse.  She has been taking some leftover amoxicillin with no relief.  The discharge is a green color and has been since onset.  Some sinus and ear pressure.  There is some drainage running down the back of her throat.  She tried over-the-counter stuff such as Sudafed, Flonase, Claritin, Mucinex.  Home Medications Prior to Admission medications   Medication Sig Start Date End Date Taking? Authorizing Provider  ipratropium (ATROVENT) 0.03 % nasal spray Place 2 sprays into both nostrils every 12 (twelve) hours. 08/15/22  Yes Sherwood Gambler, MD  oxymetazoline (AFRIN NASAL SPRAY) 0.05 % nasal spray Place 1 spray into both nostrils 2 (two) times daily for 3 days. 08/15/22 08/18/22 Yes Sherwood Gambler, MD  Cyanocobalamin (B-12) 5000 MCG CAPS Take 5,000 mcg by mouth every other day.    [provider]  diclofenac Sodium (VOLTAREN) 1 % GEL Apply 2 g topically 4 (four) times daily. 01/08/22   Ladell Pier, MD  esomeprazole (NEXIUM) 40 MG capsule TAKE 1 CAPSULE BY MOUTH EVERY DAY 05/27/22   Ladell Pier, MD  Evolocumab (REPATHA SURECLICK) 749 MG/ML SOAJ Inject 1 Dose into the skin every 14 (fourteen) days. 07/17/22   Hilty, Nadean Corwin, MD  ezetimibe (ZETIA) 10 MG tablet Take 10 mg by mouth every other day. 05/15/22   [provider]  hydrochlorothiazide (HYDRODIURIL) 25 MG tablet TAKE 1 TABLET BY  MOUTH EVERY DAY 07/01/22   Ladell Pier, MD  lidocaine (LIDODERM) 5 % Place 1 patch onto the skin daily. Remove & Discard patch within 12 hours or as directed by MD 03/08/22   Evlyn Courier, PA-C  loratadine (CLARITIN) 10 MG tablet TAKE 1 TABLET BY MOUTH EVERY DAY AS NEEDED FOR ALLERGY 08/14/21   Ladell Pier, MD  losartan (COZAAR) 100 MG tablet TAKE 1 TABLET BY MOUTH EVERY DAY 07/02/22   Ladell Pier, MD  metFORMIN (GLUCOPHAGE-XR) 500 MG 24 hr tablet Take 1 tablet (500 mg total) by mouth daily with breakfast. 01/27/22   Elsie Stain, MD  methocarbamol (ROBAXIN) 500 MG tablet Take 1 tablet (500 mg total) by mouth 2 (two) times daily. 03/08/22   Evlyn Courier, PA-C  nitroGLYCERIN (NITROSTAT) 0.4 MG SL tablet Place 1 tablet (0.4 mg total) under the tongue every 5 (five) minutes as needed. 10/21/18 07/17/22  Revankar, Reita Cliche, MD  oxyCODONE-acetaminophen (PERCOCET) 10-325 MG tablet Take 1 tablet by mouth every 6 (six) hours. 07/13/22   [provider]  pregabalin (LYRICA) 75 MG capsule Take 75 mg by mouth 2 (two) times daily. 06/12/22   [provider]  propranolol (INDERAL) 10 MG tablet TAKE 1 TABLET BY MOUTH TWICE A DAY 07/01/22   Ladell Pier, MD      Allergies    Pregabalin, Cymbalta [duloxetine hcl], Escitalopram, Gabapentin, Lipitor [atorvastatin], Pravachol [  pravastatin], and Zoloft [sertraline hcl]    Review of Systems   Review of Systems  Constitutional:  Negative for fever.  HENT:  Positive for congestion.   Respiratory:  Negative for cough and shortness of breath.     Physical Exam Updated Vital Signs BP 125/86 (BP Location: Right Arm)   Pulse 98   Temp 98.3 F (36.8 C) (Oral)   Resp 20   Ht '5\' 4"'$  (1.626 m)   Wt 127 kg   LMP 10/10/2019   SpO2 97%   BMI 48.06 kg/m  Physical Exam Vitals and nursing note reviewed.  Constitutional:      General: She is not in acute distress.    Appearance: She is well-developed. She is obese. She is not  ill-appearing or diaphoretic.  HENT:     Head: Normocephalic and atraumatic.     Nose:     Right Sinus: No maxillary sinus tenderness or frontal sinus tenderness.     Left Sinus: No maxillary sinus tenderness or frontal sinus tenderness.  Cardiovascular:     Rate and Rhythm: Normal rate and regular rhythm.     Heart sounds: Normal heart sounds.  Pulmonary:     Effort: Pulmonary effort is normal.     Breath sounds: Normal breath sounds. No wheezing.  Neurological:     Mental Status: She is alert.     ED Results / Procedures / Treatments   Labs (all labs ordered are listed, but only abnormal results are displayed) Labs Reviewed - No data to display  EKG None  Radiology No results found.  Procedures Procedures    Medications Ordered in ED Medications - No data to display  ED Course/ Medical Decision Making/ A&P                           Medical Decision Making Risk OTC drugs. Prescription drug management.   Patient is well-appearing on exam.  She has normal vital signs here.  Chart review shows she was positive for COVID on 8/28.  Her symptoms have been ongoing for over 2 weeks at this point.  However she is also been taking amoxicillin with no relief.  My suspicion is this is probably not bacterial.  There is no sinus tenderness.  I do not think antibiotics will be the answer.  I have discussed other treatment such as Afrin we will try ipratropium spray.  Otherwise I have discussed adjusting some of her over-the-counter meds and needing to follow-up with PCP.  At this point otherwise appears stable, no shortness of breath, cough, etc.  Stable for discharge home.        Final Clinical Impression(s) / ED Diagnoses Final diagnoses:  Viral sinusitis    Rx / DC Orders ED Discharge Orders          Ordered    ipratropium (ATROVENT) 0.03 % nasal spray  Every 12 hours        08/15/22 0718    oxymetazoline (AFRIN NASAL SPRAY) 0.05 % nasal spray  2 times daily         08/15/22 5701              Sherwood Gambler, MD 08/15/22 [phone]

## 2022-08-17 ENCOUNTER — Encounter (HOSPITAL_BASED_OUTPATIENT_CLINIC_OR_DEPARTMENT_OTHER)
Admission: RE | Admit: 2022-08-17 | Discharge: 2022-08-17 | Disposition: A | Discharge status: Home / Self Care | Payer: Medicare Other | Source: Ambulatory Visit | Attending: Orthopaedic Surgery | Admitting: Orthopaedic Surgery

## 2022-08-17 DIAGNOSIS — X58XXXA Exposure to other specified factors, initial encounter: Secondary | ICD-10-CM | POA: Diagnosis not present

## 2022-08-17 DIAGNOSIS — Z01812 Encounter for preprocedural laboratory examination: Secondary | ICD-10-CM | POA: Diagnosis present

## 2022-08-17 DIAGNOSIS — Z7984 Long term (current) use of oral hypoglycemic drugs: Secondary | ICD-10-CM | POA: Diagnosis not present

## 2022-08-17 DIAGNOSIS — F17210 Nicotine dependence, cigarettes, uncomplicated: Secondary | ICD-10-CM | POA: Diagnosis not present

## 2022-08-17 DIAGNOSIS — Z9851 Tubal ligation status: Secondary | ICD-10-CM | POA: Diagnosis not present

## 2022-08-17 DIAGNOSIS — F418 Other specified anxiety disorders: Secondary | ICD-10-CM | POA: Diagnosis not present

## 2022-08-17 DIAGNOSIS — S83242A Other tear of medial meniscus, current injury, left knee, initial encounter: Secondary | ICD-10-CM | POA: Diagnosis present

## 2022-08-17 DIAGNOSIS — D1724 Benign lipomatous neoplasm of skin and subcutaneous tissue of left leg: Secondary | ICD-10-CM | POA: Diagnosis not present

## 2022-08-17 DIAGNOSIS — E119 Type 2 diabetes mellitus without complications: Secondary | ICD-10-CM | POA: Diagnosis not present

## 2022-08-17 DIAGNOSIS — M659 Synovitis and tenosynovitis, unspecified: Secondary | ICD-10-CM | POA: Diagnosis not present

## 2022-08-17 DIAGNOSIS — K219 Gastro-esophageal reflux disease without esophagitis: Secondary | ICD-10-CM | POA: Diagnosis not present

## 2022-08-17 DIAGNOSIS — I10 Essential (primary) hypertension: Secondary | ICD-10-CM | POA: Diagnosis not present

## 2022-08-17 LAB — BASIC METABOLIC PANEL
Anion gap: 9 (ref 5–15)
BUN: 13 mg/dL (ref 6–20)
CO2: 26 mmol/L (ref 22–32)
Calcium: 9.1 mg/dL (ref 8.9–10.3)
Chloride: 101 mmol/L (ref 98–111)
Creatinine, Ser: 0.97 mg/dL (ref 0.44–1.00)
GFR, Estimated: >60 mL/min (ref >60)
Glucose, Bld: 122 mg/dL — ABNORMAL HIGH (ref 70–99)
Potassium: 4.3 mmol/L (ref 3.5–5.1)
Sodium: 136 mmol/L (ref 135–145)

## 2022-08-17 NOTE — Progress Notes (Signed)

## 2022-08-18 ENCOUNTER — Ambulatory Visit: Payer: Self-pay | Admitting: Licensed Clinical Social Worker

## 2022-08-19 ENCOUNTER — Encounter (HOSPITAL_BASED_OUTPATIENT_CLINIC_OR_DEPARTMENT_OTHER)
Admission: RE | Disposition: A | Discharge status: Home / Self Care | Payer: Self-pay | Source: Home / Self Care | Attending: Orthopaedic Surgery

## 2022-08-19 ENCOUNTER — Encounter: Payer: Medicaid - Dental | Admitting: Orthopaedic Surgery

## 2022-08-19 ENCOUNTER — Ambulatory Visit (HOSPITAL_BASED_OUTPATIENT_CLINIC_OR_DEPARTMENT_OTHER)
Admission: RE | Admit: 2022-08-19 | Discharge: 2022-08-19 | Disposition: A | Discharge status: Home / Self Care | Payer: Medicare Other | Attending: Orthopaedic Surgery | Admitting: Orthopaedic Surgery

## 2022-08-19 ENCOUNTER — Encounter (HOSPITAL_BASED_OUTPATIENT_CLINIC_OR_DEPARTMENT_OTHER): Payer: Self-pay | Admitting: Orthopaedic Surgery

## 2022-08-19 ENCOUNTER — Encounter: Payer: Self-pay | Admitting: Orthopaedic Surgery

## 2022-08-19 ENCOUNTER — Ambulatory Visit (HOSPITAL_BASED_OUTPATIENT_CLINIC_OR_DEPARTMENT_OTHER): Payer: Medicare Other | Admitting: Certified Registered"

## 2022-08-19 DIAGNOSIS — D1724 Benign lipomatous neoplasm of skin and subcutaneous tissue of left leg: Secondary | ICD-10-CM | POA: Insufficient documentation

## 2022-08-19 DIAGNOSIS — Z9851 Tubal ligation status: Secondary | ICD-10-CM | POA: Insufficient documentation

## 2022-08-19 DIAGNOSIS — I10 Essential (primary) hypertension: Secondary | ICD-10-CM | POA: Diagnosis not present

## 2022-08-19 DIAGNOSIS — E119 Type 2 diabetes mellitus without complications: Secondary | ICD-10-CM | POA: Insufficient documentation

## 2022-08-19 DIAGNOSIS — Z7984 Long term (current) use of oral hypoglycemic drugs: Secondary | ICD-10-CM | POA: Insufficient documentation

## 2022-08-19 DIAGNOSIS — M12262 Villonodular synovitis (pigmented), left knee: Secondary | ICD-10-CM | POA: Diagnosis not present

## 2022-08-19 DIAGNOSIS — Z01818 Encounter for other preprocedural examination: Secondary | ICD-10-CM

## 2022-08-19 DIAGNOSIS — X58XXXA Exposure to other specified factors, initial encounter: Secondary | ICD-10-CM | POA: Insufficient documentation

## 2022-08-19 DIAGNOSIS — F17210 Nicotine dependence, cigarettes, uncomplicated: Secondary | ICD-10-CM | POA: Diagnosis not present

## 2022-08-19 DIAGNOSIS — F418 Other specified anxiety disorders: Secondary | ICD-10-CM | POA: Insufficient documentation

## 2022-08-19 DIAGNOSIS — S83242A Other tear of medial meniscus, current injury, left knee, initial encounter: Secondary | ICD-10-CM | POA: Diagnosis not present

## 2022-08-19 DIAGNOSIS — S83282A Other tear of lateral meniscus, current injury, left knee, initial encounter: Secondary | ICD-10-CM | POA: Diagnosis not present

## 2022-08-19 DIAGNOSIS — M659 Synovitis and tenosynovitis, unspecified: Secondary | ICD-10-CM | POA: Insufficient documentation

## 2022-08-19 DIAGNOSIS — K219 Gastro-esophageal reflux disease without esophagitis: Secondary | ICD-10-CM | POA: Insufficient documentation

## 2022-08-19 HISTORY — PX: KNEE ARTHROSCOPY WITH MEDIAL MENISECTOMY: SHX5651

## 2022-08-19 HISTORY — PX: SYNOVECTOMY: SHX5180

## 2022-08-19 SURGERY — ARTHROSCOPY, KNEE, WITH MEDIAL MENISCECTOMY
Anesthesia: General | Site: Knee | Laterality: Left

## 2022-08-19 MED ORDER — CEFAZOLIN IN SODIUM CHLORIDE 3-0.9 GM/100ML-% IV SOLN
INTRAVENOUS | Status: AC
  Filled 2022-08-19: qty 100

## 2022-08-19 MED ORDER — FENTANYL CITRATE (PF) 100 MCG/2ML IJ SOLN
25.0000 ug | INTRAMUSCULAR | Status: DC | PRN
  Administered 2022-08-19 (×2): 50 ug via INTRAVENOUS

## 2022-08-19 MED ORDER — FENTANYL CITRATE (PF) 100 MCG/2ML IJ SOLN
INTRAMUSCULAR | Status: AC
  Filled 2022-08-19: qty 2

## 2022-08-19 MED ORDER — BUPIVACAINE HCL (PF) 0.25 % IJ SOLN
INTRAMUSCULAR | Status: DC | PRN
  Administered 2022-08-19: 20 mL

## 2022-08-19 MED ORDER — PROPOFOL 10 MG/ML IV BOLUS
INTRAVENOUS | Status: AC
Start: 2022-08-19 — End: ?
  Filled 2022-08-19: qty 20

## 2022-08-19 MED ORDER — LACTATED RINGERS IV SOLN: INTRAVENOUS | Status: DC

## 2022-08-19 MED ORDER — SODIUM CHLORIDE 0.9 % IR SOLN
Status: DC | PRN
  Administered 2022-08-19: 3000 mL

## 2022-08-19 MED ORDER — OXYCODONE HCL 5 MG PO TABS
ORAL_TABLET | ORAL | Status: AC
  Filled 2022-08-19: qty 1

## 2022-08-19 MED ORDER — DEXAMETHASONE SODIUM PHOSPHATE 4 MG/ML IJ SOLN
INTRAMUSCULAR | Status: DC | PRN
  Administered 2022-08-19: 10 mg via INTRAVENOUS

## 2022-08-19 MED ORDER — ACETAMINOPHEN 500 MG PO TABS
ORAL_TABLET | ORAL | Status: AC
  Filled 2022-08-19: qty 2

## 2022-08-19 MED ORDER — MIDAZOLAM HCL 5 MG/5ML IJ SOLN
INTRAMUSCULAR | Status: DC | PRN
  Administered 2022-08-19: 2 mg via INTRAVENOUS

## 2022-08-19 MED ORDER — FENTANYL CITRATE (PF) 100 MCG/2ML IJ SOLN
INTRAMUSCULAR | Status: DC | PRN
  Administered 2022-08-19 (×2): 50 ug via INTRAVENOUS

## 2022-08-19 MED ORDER — KETOROLAC TROMETHAMINE 30 MG/ML IJ SOLN: 30.0000 mg | Freq: Once | INTRAMUSCULAR | Status: DC | PRN

## 2022-08-19 MED ORDER — PROMETHAZINE HCL 25 MG/ML IJ SOLN: 6.2500 mg | INTRAMUSCULAR | Status: DC | PRN

## 2022-08-19 MED ORDER — MIDAZOLAM HCL 2 MG/2ML IJ SOLN
INTRAMUSCULAR | Status: AC
  Filled 2022-08-19: qty 2

## 2022-08-19 MED ORDER — ONDANSETRON HCL 4 MG/2ML IJ SOLN
INTRAMUSCULAR | Status: AC
  Filled 2022-08-19: qty 2

## 2022-08-19 MED ORDER — PROPOFOL 10 MG/ML IV BOLUS
INTRAVENOUS | Status: AC
  Filled 2022-08-19: qty 20

## 2022-08-19 MED ORDER — HYDROCODONE-ACETAMINOPHEN 5-325 MG PO TABS: 1.0000 | ORAL_TABLET | Freq: Four times a day (QID) | ORAL | 0 refills | Status: DC | PRN

## 2022-08-19 MED ORDER — LIDOCAINE HCL (CARDIAC) PF 100 MG/5ML IV SOSY
PREFILLED_SYRINGE | INTRAVENOUS | Status: DC | PRN
  Administered 2022-08-19: 60 mg via INTRAVENOUS

## 2022-08-19 MED ORDER — ROCURONIUM BROMIDE 10 MG/ML (PF) SYRINGE
PREFILLED_SYRINGE | INTRAVENOUS | Status: AC
  Filled 2022-08-19: qty 10

## 2022-08-19 MED ORDER — ONDANSETRON HCL 4 MG/2ML IJ SOLN
INTRAMUSCULAR | Status: DC | PRN
  Administered 2022-08-19: 4 mg via INTRAVENOUS

## 2022-08-19 MED ORDER — ROCURONIUM BROMIDE 100 MG/10ML IV SOLN
INTRAVENOUS | Status: DC | PRN
  Administered 2022-08-19: 40 mg via INTRAVENOUS

## 2022-08-19 MED ORDER — LIDOCAINE 2% (20 MG/ML) 5 ML SYRINGE
INTRAMUSCULAR | Status: AC
  Filled 2022-08-19: qty 5

## 2022-08-19 MED ORDER — CEFAZOLIN IN SODIUM CHLORIDE 3-0.9 GM/100ML-% IV SOLN
3.0000 g | INTRAVENOUS | Status: AC
  Administered 2022-08-19: 3 g via INTRAVENOUS

## 2022-08-19 MED ORDER — PROPOFOL 10 MG/ML IV BOLUS
INTRAVENOUS | Status: DC | PRN
  Administered 2022-08-19: 100 mg via INTRAVENOUS

## 2022-08-19 MED ORDER — SUGAMMADEX SODIUM 500 MG/5ML IV SOLN
INTRAVENOUS | Status: DC | PRN
  Administered 2022-08-19: 300 mg via INTRAVENOUS

## 2022-08-19 MED ORDER — SUGAMMADEX SODIUM 500 MG/5ML IV SOLN
INTRAVENOUS | Status: AC
  Filled 2022-08-19: qty 5

## 2022-08-19 MED ORDER — AMISULPRIDE (ANTIEMETIC) 5 MG/2ML IV SOLN: 10.0000 mg | Freq: Once | INTRAVENOUS | Status: DC | PRN

## 2022-08-19 MED ORDER — OXYCODONE HCL 5 MG PO TABS
5.0000 mg | ORAL_TABLET | Freq: Once | ORAL | Status: AC | PRN
  Administered 2022-08-19: 5 mg via ORAL

## 2022-08-19 MED ORDER — ACETAMINOPHEN 500 MG PO TABS
1000.0000 mg | ORAL_TABLET | Freq: Once | ORAL | Status: AC
  Administered 2022-08-19: 1000 mg via ORAL

## 2022-08-19 MED ORDER — OXYCODONE HCL 5 MG/5ML PO SOLN: 5.0000 mg | Freq: Once | ORAL | Status: AC | PRN

## 2022-08-19 SURGICAL SUPPLY — 38 items
BANDAGE ESMARK 6X9 LF (GAUZE/BANDAGES/DRESSINGS) IMPLANT
BLADE EXCALIBUR 4.0X13 (MISCELLANEOUS) ×1 IMPLANT
BLADE SHAVER TORPEDO 4X13 (MISCELLANEOUS) IMPLANT
BNDG CMPR 9X6 STRL LF SNTH (GAUZE/BANDAGES/DRESSINGS)
BNDG ELASTIC 6X5.8 VLCR STR LF (GAUZE/BANDAGES/DRESSINGS) ×2 IMPLANT
BNDG ESMARK 6X9 LF (GAUZE/BANDAGES/DRESSINGS)
BURR OVAL 8 FLU 4.0X13 (MISCELLANEOUS) IMPLANT
COOLER ICEMAN CLASSIC (MISCELLANEOUS) ×1 IMPLANT
CUFF TOURN SGL QUICK 34 (TOURNIQUET CUFF) ×1
CUFF TRNQT CYL 34X4.125X (TOURNIQUET CUFF) ×1 IMPLANT
CUTTER BONE 4.0MM X 13CM (MISCELLANEOUS) IMPLANT
DISSECTOR 3.5MM X 13CM CVD (MISCELLANEOUS) IMPLANT
DRAPE ARTHROSCOPY W/POUCH 90 (DRAPES) ×1 IMPLANT
DRAPE IMP U-DRAPE 54X76 (DRAPES) ×1 IMPLANT
DRAPE U-SHAPE 47X51 STRL (DRAPES) ×1 IMPLANT
DURAPREP 26ML APPLICATOR (WOUND CARE) ×2 IMPLANT
ELECT MENISCUS 165MM 90D (ELECTRODE) IMPLANT
EXCALIBUR 3.8MM X 13CM (MISCELLANEOUS) IMPLANT
GAUZE SPONGE 4X4 12PLY STRL (GAUZE/BANDAGES/DRESSINGS) ×1 IMPLANT
GAUZE XEROFORM 1X8 LF (GAUZE/BANDAGES/DRESSINGS) ×1 IMPLANT
GLOVE ECLIPSE 7.0 STRL STRAW (GLOVE) ×2 IMPLANT
GLOVE INDICATOR 7.0 STRL GRN (GLOVE) ×2 IMPLANT
GLOVE INDICATOR 7.5 STRL GRN (GLOVE) ×1 IMPLANT
GLOVE SURG SYN 7.5  E (GLOVE) ×1
GLOVE SURG SYN 7.5 E (GLOVE) ×1 IMPLANT
GLOVE SURG SYN 7.5 PF PI (GLOVE) ×1 IMPLANT
GOWN STRL REIN XL XLG (GOWN DISPOSABLE) ×2 IMPLANT
GOWN STRL REUS W/ TWL LRG LVL3 (GOWN DISPOSABLE) ×1 IMPLANT
GOWN STRL REUS W/TWL LRG LVL3 (GOWN DISPOSABLE) ×1
MANIFOLD NEPTUNE II (INSTRUMENTS) ×1 IMPLANT
PACK ARTHROSCOPY DSU (CUSTOM PROCEDURE TRAY) ×1 IMPLANT
PACK BASIN DAY SURGERY FS (CUSTOM PROCEDURE TRAY) ×1 IMPLANT
PAD COLD SHLDR WRAP-ON (PAD) ×1 IMPLANT
PENCIL SMOKE EVACUATOR (MISCELLANEOUS) IMPLANT
SHEET MEDIUM DRAPE 40X70 STRL (DRAPES) ×1 IMPLANT
SUT ETHILON 3 0 PS 1 (SUTURE) ×1 IMPLANT
TOWEL GREEN STERILE FF (TOWEL DISPOSABLE) ×1 IMPLANT
TUBING ARTHROSCOPY IRRIG 16FT (MISCELLANEOUS) ×1 IMPLANT

## 2022-08-19 NOTE — Discharge Instructions (Addendum)
TOOK OXY IR at 2pm.  Can retake pain med '@8pm'$           Post-operative patient instructions  Knee Arthroscopy   Ice:  Place intermittent ice or cooler pack over your knee, 30 minutes on and 30 minutes off.  Continue this for the first 72 hours after surgery, then save ice for use after therapy sessions or on more active days.   Weight:  You may bear weight on your leg as your symptoms allow. Crutches:  Use crutches (or walker) to assist in walking until told to discontinue by your physical therapist or physician. This will help to reduce pain. Strengthening:  Perform simple thigh squeezes (isometric quad contractions) and straight leg lifts as you are able (3 sets of 5 to 10 repetitions, 3 times a day).  For the leg lifts, have someone support under your ankle in the beginning until you have increased strength enough to do this on your own.  To help get started on thigh squeezes, place a pillow under your knee and push down on the pillow with back of knee (sometimes easier to do than with your leg fully straight). Motion:  Perform gentle knee motion as tolerated - this is gentle bending and straightening of the knee. Seated heel slides: you can start by sitting in a chair, remove your brace, and gently slide your heel back on the floor - allowing your knee to bend. Have someone help you straighten your knee (or use your other leg/foot hooked under your ankle.  Dressing:  Perform 1st dressing change at 2 days postoperative. A moderate amount of blood tinged drainage is to be expected.  So if you bleed through the dressing on the first or second day or if you have fevers, it is fine to change the dressing/check the wounds early and redress wound. Elevate your leg.  If it bleeds through again, or if the incisions are leaking frank blood, please call the office. May change dressing every 1-2 days thereafter to help watch wounds. Can purchase Tegaderm (or 49M Nexcare) water resistant dressings at local  pharmacy / [employer]. Shower:  Light shower is ok after 2 days.  Please take shower, NO bath. Recover with gauze and ace wrap to help keep wounds protected.   Pain medication:  A narcotic pain medication has been prescribed.  Take as directed.  Typically you need narcotic pain medication more regularly during the first 3 to 5 days after surgery.  Decrease your use of the medication as the pain improves.  Narcotics can sometimes cause constipation, even after a few doses.  If you have problems with constipation, you can take an over the counter stool softener or light laxative.  If you have persistent problems, please notify your physician's office. Physical therapy: Additional activity guidelines to be provided by your physician or physical therapist at follow-up visits.  Driving: Do not recommend driving x 1 week post surgical, especially if surgery performed on right side. Should not drive while taking narcotic pain medications. It typically takes at least 1 week to restore sufficient neuromuscular function for normal reaction times for driving safety.  Call [phone] for questions or problems. Evenings you will be forwarded to the hospital operator.  Ask for the orthopaedic physician on call. Please call if you experience:    Redness, foul smelling, or persistent drainage from the surgical site  worsening knee pain and swelling not responsive to medication  any calf pain and or swelling of the lower leg  temperatures greater than 101.5 F other questions or concerns   Thank you for allowing [place] to be a part of your care.  Post Anesthesia Home Care Instructions  Activity: Get plenty of rest for the remainder of the day. A responsible individual must stay with you for 24 hours following the procedure.  For the next 24 hours, DO NOT: -Drive a car -Paediatric nurse -Drink alcoholic beverages -Take any medication unless instructed by your physician -Make any legal decisions or sign important  papers.  Meals: Start with liquid foods such as gelatin or soup. Progress to regular foods as tolerated. Avoid greasy, spicy, heavy foods. If nausea and/or vomiting occur, drink only clear liquids until the nausea and/or vomiting subsides. Call your physician if vomiting continues.  Special Instructions/Symptoms: Your throat may feel dry or sore from the anesthesia or the breathing tube placed in your throat during surgery. If this causes discomfort, gargle with warm salt water. The discomfort should disappear within 24 hours.  If you had a scopolamine patch placed behind your ear for the management of post- operative nausea and/or vomiting:  1. The medication in the patch is effective for 72 hours, after which it should be removed.  Wrap patch in a tissue and discard in the trash. Wash hands thoroughly with soap and water. 2. You may remove the patch earlier than 72 hours if you experience unpleasant side effects which may include dry mouth, dizziness or visual disturbances. 3. Avoid touching the patch. Wash your hands with soap and water after contact with the patch.     Post Anesthesia Home Care Instructions  Activity: Get plenty of rest for the remainder of the day. A responsible individual must stay with you for 24 hours following the procedure.  For the next 24 hours, DO NOT: -Drive a car -Paediatric nurse -Drink alcoholic beverages -Take any medication unless instructed by your physician -Make any legal decisions or sign important papers.  Meals: Start with liquid foods such as gelatin or soup. Progress to regular foods as tolerated. Avoid greasy, spicy, heavy foods. If nausea and/or vomiting occur, drink only clear liquids until the nausea and/or vomiting subsides. Call your physician if vomiting continues.  Special Instructions/Symptoms: Your throat may feel dry or sore from the anesthesia or the breathing tube placed in your throat during surgery. If this causes discomfort,  gargle with warm salt water. The discomfort should disappear within 24 hours.  If you had a scopolamine patch placed behind your ear for the management of post- operative nausea and/or vomiting:  1. The medication in the patch is effective for 72 hours, after which it should be removed.  Wrap patch in a tissue and discard in the trash. Wash hands thoroughly with soap and water. 2. You may remove the patch earlier than 72 hours if you experience unpleasant side effects which may include dry mouth, dizziness or visual disturbances. 3. Avoid touching the patch. Wash your hands with soap and water after contact with the patch.    Post Anesthesia Home Care Instructions  Activity: Get plenty of rest for the remainder of the day. A responsible individual must stay with you for 24 hours following the procedure.  For the next 24 hours, DO NOT: -Drive a car -Paediatric nurse -Drink alcoholic beverages -Take any medication unless instructed by your physician -Make any legal decisions or sign important papers.  Meals: Start with liquid foods such as gelatin or soup. Progress to regular foods as tolerated. Avoid greasy, spicy, heavy  foods. If nausea and/or vomiting occur, drink only clear liquids until the nausea and/or vomiting subsides. Call your physician if vomiting continues.  Special Instructions/Symptoms: Your throat may feel dry or sore from the anesthesia or the breathing tube placed in your throat during surgery. If this causes discomfort, gargle with warm salt water. The discomfort should disappear within 24 hours.  If you had a scopolamine patch placed behind your ear for the management of post- operative nausea and/or vomiting:  1. The medication in the patch is effective for 72 hours, after which it should be removed.  Wrap patch in a tissue and discard in the trash. Wash hands thoroughly with soap and water. 2. You may remove the patch earlier than 72 hours if you experience unpleasant  side effects which may include dry mouth, dizziness or visual disturbances. 3. Avoid touching the patch. Wash your hands with soap and water after contact with the patch.    Post Anesthesia Home Care Instructions  Activity: Get plenty of rest for the remainder of the day. A responsible individual must stay with you for 24 hours following the procedure.  For the next 24 hours, DO NOT: -Drive a car -Paediatric nurse -Drink alcoholic beverages -Take any medication unless instructed by your physician -Make any legal decisions or sign important papers.  Meals: Start with liquid foods such as gelatin or soup. Progress to regular foods as tolerated. Avoid greasy, spicy, heavy foods. If nausea and/or vomiting occur, drink only clear liquids until the nausea and/or vomiting subsides. Call your physician if vomiting continues.  Special Instructions/Symptoms: Your throat may feel dry or sore from the anesthesia or the breathing tube placed in your throat during surgery. If this causes discomfort, gargle with warm salt water. The discomfort should disappear within 24 hours.  If you had a scopolamine patch placed behind your ear for the management of post- operative nausea and/or vomiting:  1. The medication in the patch is effective for 72 hours, after which it should be removed.  Wrap patch in a tissue and discard in the trash. Wash hands thoroughly with soap and water. 2. You may remove the patch earlier than 72 hours if you experience unpleasant side effects which may include dry mouth, dizziness or visual disturbances. 3. Avoid touching the patch. Wash your hands with soap and water after contact with the patch.       Next dose of tylenol may be given at 4p

## 2022-08-19 NOTE — Op Note (Signed)
   Surgery Date: 08/19/2022  PREOPERATIVE DIAGNOSES:  1. Left knee medial meniscus tear 2. Left knee lipoma arborescens/PVNS  POSTOPERATIVE DIAGNOSES:  same  PROCEDURES PERFORMED:  1. Left knee arthroscopy with major synovectomy 2. Left knee arthroscopy with arthroscopic partial medial meniscectomy 3. Left knee arthroscopy with arthroscopic chondroplasty medial femoral condyle and tibial plateau.  SURGEON: N. Eduard Roux, M.D.  ASSIST: Sarah Randall, [place]; necessary for the timely completion of procedure and due to complexity of procedure.  ANESTHESIA:  general  FLUIDS: Per anesthesia record.   ESTIMATED BLOOD LOSS: minimal  * No implants in log *  DESCRIPTION OF PROCEDURE: [name] is a 56 y.o.-year-old female with above mentioned conditions. Full discussion held regarding risks benefits alternatives and complications related surgical intervention. Conservative care options reviewed. All questions answered.  The patient was identified in the preoperative holding area and the operative extremity was marked. The patient was brought to the operating room and transferred to operating table in a supine position. Satisfactory general anesthesia was induced by anesthesiology.    Standard anterolateral, anteromedial arthroscopy portals were obtained. The anteromedial portal was obtained with a spinal needle for localization under direct visualization with subsequent diagnostic findings.   Valgus force was placed on the knee to address the medial compartment.  Grade 3 changes were encountered.  No areas of full-thickness chondral defects.  Complex tear with horizontal cleavage tear was identified in the posterior horn and a large radial tear of the meniscal root.  Partial medial meniscectomy was performed with a basket and oscillating shaver back to stable margins.  We removed about 40% of the volume of the meniscus.  The cruciates were unremarkable.  Lateral compartment had a focal  area of grade 2 changes.  There was some degenerative fraying of the lateral meniscus without any frank tears.  Knee was then placed into extension and grade 3 almost 4 changes were encountered on the femoral trochlea.  Grade 2 changes of the patellar surface.  The synovium in the suprapatellar pouch was examined and was consistent with a proliferative process such as a PVNS or lipoma arborescens.  Synovial biopsy was performed.  Major synovectomy in the suprapatellar pouch as well as the gutters.  Gutters were checked for loose bodies.  Excess fluid was removed from the knee joint.  Incisions were closed with interrupted nylon sutures.  Sterile dressings were applied.  Patient tolerated procedure well had no immediate complications.  Suprapatellar pouch and gutters: PVNS/lipoma arborescens Patella chondral surface: Grade 2 Trochlear chondral surface: Grade 3-4 Patellofemoral tracking: normal Medial meniscus: complex tear posterior horn.  Medial femoral condyle weight bearing surface: Grade 3 Medial tibial plateau: Grade 3 Anterior cruciate ligament:stable Posterior cruciate ligament:stable Lateral meniscus: Degenerative fraying without frank tear.   Lateral femoral condyle weight bearing surface: Grade 1 Lateral tibial plateau: Grade 1  DISPOSITION: The patient was awakened from general anesthetic, extubated, taken to the recovery room in medically stable condition, no apparent complications. The patient may be weightbearing as tolerated to the operative lower extremity.  Range of motion of right knee as tolerated.  Sarah Cecil, MD [facility] 12:53 PM

## 2022-08-19 NOTE — Anesthesia Preprocedure Evaluation (Addendum)
Anesthesia Evaluation  Patient identified by MRN, date of birth, ID band Patient awake    Reviewed: Allergy & Precautions, NPO status , Patient's Chart, lab work & pertinent test results  Airway Mallampati: II  TM Distance: >3 FB Neck ROM: Full    Dental no notable dental hx.    Pulmonary sleep apnea , Current Smoker and Patient abstained from smoking.,    Pulmonary exam normal        Cardiovascular hypertension, Pt. on medications and Pt. on home beta blockers Normal cardiovascular exam     Neuro/Psych PSYCHIATRIC DISORDERS Anxiety Depression  Neuromuscular disease    GI/Hepatic GERD  Medicated and Controlled,(+)     substance abuse  ,   Endo/Other  diabetes, Oral Hypoglycemic AgentsMorbid obesity  Renal/GU negative Renal ROS     Musculoskeletal  (+) Arthritis , narcotic dependent  Abdominal (+) + obese,   Peds  Hematology negative hematology ROS (+)   Anesthesia Other Findings left knee medial meniscal tear, synovitis  Reproductive/Obstetrics S/p BTL                            Anesthesia Physical Anesthesia Plan  ASA: 3  Anesthesia Plan: General   Post-op Pain Management:    Induction: Intravenous  PONV Risk Score and Plan: 2 and Ondansetron, Dexamethasone, Midazolam and Treatment may vary due to age or medical condition  Airway Management Planned: Oral ETT  Additional Equipment:   Intra-op Plan:   Post-operative Plan: Extubation in OR  Informed Consent: I have reviewed the patients History and Physical, chart, labs and discussed the procedure including the risks, benefits and alternatives for the proposed anesthesia with the patient or authorized representative who has indicated his/her understanding and acceptance.     Dental advisory given  Plan Discussed with: CRNA  Anesthesia Plan Comments:         Anesthesia Quick Evaluation

## 2022-08-19 NOTE — Transfer of Care (Signed)
Immediate Anesthesia Transfer of Care Note  Patient: Sarah Randall  Procedure(s) Performed: LEFT KNEE ARTHROSCOPY WITH PARTIAL MEDIAL MENISECTOMY (Left: Knee) LEFT KNEE MAJOR SYNOVECTOMY (Left: Knee)  Patient Location: PACU  Anesthesia Type:General  Level of Consciousness: drowsy, patient cooperative and responds to stimulation  Airway & Oxygen Therapy: Patient Spontanous Breathing and Patient connected to face mask oxygen  Post-op Assessment: Report given to RN and Post -op Vital signs reviewed and stable  Post vital signs: Reviewed and stable  Last Vitals:  Vitals Value Taken Time  BP    Temp    Pulse    Resp    SpO2      Last Pain:  Vitals:   08/19/22 1037  TempSrc: Oral  PainSc: 7       Patients Stated Pain Goal: 5 (85/92/92 4462)  Complications: No notable events documented.

## 2022-08-19 NOTE — Anesthesia Procedure Notes (Signed)
Procedure Name: Intubation Date/Time: 08/19/2022 12:22 PM  Performed by: Glory Buff, CRNAPre-anesthesia Checklist: Patient identified, Emergency Drugs available, Suction available and Patient being monitored Patient Re-evaluated:Patient Re-evaluated prior to induction Oxygen Delivery Method: Circle system utilized Preoxygenation: Pre-oxygenation with 100% oxygen Induction Type: IV induction Ventilation: Mask ventilation without difficulty Laryngoscope Size: Miller and 3 Grade View: Grade I Tube type: Oral Tube size: 7.0 mm Number of attempts: 1 Airway Equipment and Method: Stylet and Oral airway Placement Confirmation: ETT inserted through vocal cords under direct vision, positive ETCO2 and breath sounds checked- equal and bilateral Secured at: 21 cm Tube secured with: Tape Dental Injury: Teeth and Oropharynx as per pre-operative assessment

## 2022-08-19 NOTE — H&P (Signed)
PREOPERATIVE H&P  Chief Complaint: left knee  medial meniscal tear, synovitis  HPI: Sarah Randall is a 56 y.o. female who presents for surgical treatment of left knee  medial meniscal tear, synovitis.  She denies any changes in medical history.  Past Medical History:  Diagnosis Date   Anemia    Anxiety    Arthritis    B12 deficiency 06/30/2019   Depression    GERD (gastroesophageal reflux disease)    occasionally takes OTC   History of leukocytosis    Hyperlipidemia    Hypertension    Low back pain    Obesity    Pain in right buttock    Goes down to Right leg to lateral aspect of calf.   Pre-diabetes    Sleep apnea    tested 2014 - unable to tolerate machine   Spondylolisthesis at L4-L5 level    Past Surgical History:  Procedure Laterality Date   ABDOMINAL EXPOSURE N/A 01/22/2021   Procedure: LATERAL EXPOSURE FOR OBLIQUE LATERAL INTERBODY FUSION;  Surgeon: Marty Heck, MD;  Location: [facility] OR;  Service: Vascular;  Laterality: N/A;   ABDOMINAL HYSTERECTOMY     ANTERIOR LUMBAR FUSION N/A 01/22/2021   Procedure: Oblique Lateral Interbody Fusion Lumbar Four-Lumbar Five;  Surgeon: Vallarie Mare, MD;  Location: [place];  Service: Neurosurgery;  Laterality: N/A;   LEFT HEART CATH AND CORONARY ANGIOGRAPHY N/A 10/25/2018   Procedure: LEFT HEART CATH AND CORONARY ANGIOGRAPHY;  Surgeon: Martinique, Peter M, MD;  Location: [place] CV LAB;  Service: Cardiovascular;  Laterality: N/A;   LUMBAR PERCUTANEOUS PEDICLE SCREW 1 LEVEL N/A 01/22/2021   Procedure: LUMBAR PERCUTANEOUS PEDICLE SCREW LUBAR FOUR- FIVE;  Surgeon: Vallarie Mare, MD;  Location: [place];  Service: Neurosurgery;  Laterality: N/A;   ROBOTIC ASSISTED LAPAROSCOPIC HYSTERECTOMY AND SALPINGECTOMY Bilateral 12/27/2019   Procedure: XI ROBOTIC ASSISTED TOTAL LAPAROSCOPIC HYSTERECTOMY AND SALPINGECTOMY;  Surgeon: Princess Bruins, MD;  Location: [place];  Service: Gynecology;  Laterality: Bilateral;    TOTAL HIP ARTHROPLASTY Left 09/22/2017   Procedure: LEFT TOTAL HIP ARTHROPLASTY ANTERIOR APPROACH;  Surgeon: Leandrew Koyanagi, MD;  Location: [place];  Service: Orthopedics;  Laterality: Left;   TOTAL KNEE ARTHROPLASTY Right 01/12/2018   Procedure: RIGHT TOTAL KNEE ARTHROPLASTY;  Surgeon: Leandrew Koyanagi, MD;  Location: [place];  Service: Orthopedics;  Laterality: Right;   TUBAL LIGATION     Social History   Socioeconomic History   Marital status: Single    Spouse name: Not on file   Number of children: 2   Years of education: 9   Highest education level: Not on file  Occupational History   Occupation: unemployed  Tobacco Use   Smoking status: Every Day    Packs/day: 0.25    Years: 30.00    Total pack years: 7.50    Types: Cigarettes   Smokeless tobacco: Never  Vaping Use   Vaping Use: Never used  Substance and Sexual Activity   Alcohol use: Yes    Alcohol/week: 4.0 - 6.0 standard drinks of alcohol    Types: 4 - 6 Shots of liquor per week    Comment: occ   Drug use: Not Currently    Frequency: 2.0 times per week    Types: Marijuana    Comment: states she does not use marijuana any more   Sexual activity: Yes    Birth control/protection: None    Comment: declined insurance questions,des neg  Other Topics Concern  Not on file  Social History Narrative   Not on file   Social Determinants of Health   Financial Resource Strain: Not on file  Food Insecurity: Not on file  Transportation Needs: Not on file  Physical Activity: Not on file  Stress: Not on file  Social Connections: Not on file   Family History  Problem Relation Age of Onset   Hypertension Mother    Diabetes Mother    Breast cancer Mother 54   Hypertension Father    Lung cancer Maternal Aunt    Colon polyps Brother    Colon cancer Neg Hx    Esophageal cancer Neg Hx    Rectal cancer Neg Hx    Stomach cancer Neg Hx    Allergies  Allergen Reactions   Pregabalin Swelling   Cymbalta [Duloxetine Hcl] Nausea  Only   Escitalopram     Made her feel out of it.   Gabapentin Swelling   Lipitor [Atorvastatin] Other (See Comments)    Stabbing pains in legs   Pravachol [Pravastatin]     Sharp pains in LT leg   Zoloft [Sertraline Hcl] Other (See Comments)    tremors   Prior to Admission medications   Medication Sig Start Date End Date Taking? Authorizing Provider  Cyanocobalamin (B-12) 5000 MCG CAPS Take 5,000 mcg by mouth every other day.   Yes [provider]  diclofenac Sodium (VOLTAREN) 1 % GEL Apply 2 g topically 4 (four) times daily. 01/08/22  Yes Ladell Pier, MD  esomeprazole (NEXIUM) 40 MG capsule TAKE 1 CAPSULE BY MOUTH EVERY DAY 05/27/22  Yes Ladell Pier, MD  Evolocumab (REPATHA SURECLICK) 557 MG/ML SOAJ Inject 1 Dose into the skin every 14 (fourteen) days. 07/17/22  Yes Hilty, Nadean Corwin, MD  hydrochlorothiazide (HYDRODIURIL) 25 MG tablet TAKE 1 TABLET BY MOUTH EVERY DAY 07/01/22  Yes Ladell Pier, MD  ipratropium (ATROVENT) 0.03 % nasal spray Place 2 sprays into both nostrils every 12 (twelve) hours. 08/15/22  Yes Sherwood Gambler, MD  loratadine (CLARITIN) 10 MG tablet TAKE 1 TABLET BY MOUTH EVERY DAY AS NEEDED FOR ALLERGY 08/14/21  Yes Ladell Pier, MD  losartan (COZAAR) 100 MG tablet TAKE 1 TABLET BY MOUTH EVERY DAY 07/02/22  Yes Ladell Pier, MD  methocarbamol (ROBAXIN) 500 MG tablet Take 1 tablet (500 mg total) by mouth 2 (two) times daily. 03/08/22  Yes Deatra Canter, Amjad, PA-C  oxyCODONE-acetaminophen (PERCOCET) 10-325 MG tablet Take 1 tablet by mouth every 6 (six) hours. 07/13/22  Yes [provider]  pregabalin (LYRICA) 75 MG capsule Take 75 mg by mouth 2 (two) times daily. 06/12/22  Yes [provider]  propranolol (INDERAL) 10 MG tablet TAKE 1 TABLET BY MOUTH TWICE A DAY 07/01/22  Yes Ladell Pier, MD  ezetimibe (ZETIA) 10 MG tablet Take 10 mg by mouth every other day. 05/15/22   [provider]  lidocaine (LIDODERM) 5 % Place 1 patch  onto the skin daily. Remove & Discard patch within 12 hours or as directed by MD 03/08/22   Evlyn Courier, PA-C  nitroGLYCERIN (NITROSTAT) 0.4 MG SL tablet Place 1 tablet (0.4 mg total) under the tongue every 5 (five) minutes as needed. 10/21/18 07/17/22  Revankar, Reita Cliche, MD     Positive ROS: All other systems have been reviewed and were otherwise negative with the exception of those mentioned in the HPI and as above.  Physical Exam: General: Alert, no acute distress Cardiovascular: No pedal edema Respiratory: No  cyanosis, no use of accessory musculature GI: abdomen soft Skin: No lesions in the area of chief complaint Neurologic: Sensation intact distally Psychiatric: Patient is competent for consent with normal mood and affect Lymphatic: no lymphedema  MUSCULOSKELETAL: exam stable  Assessment: left knee  medial meniscal tear, synovitis  Plan: Plan for Procedure(s): LEFT KNEE ARTHROSCOPY WITH PARTIAL MEDIAL MENISECTOMY LEFT KNEE MAJOR SYNOVECTOMY  The risks benefits and alternatives were discussed with the patient including but not limited to the risks of nonoperative treatment, versus surgical intervention including infection, bleeding, nerve injury,  blood clots, cardiopulmonary complications, morbidity, mortality, among others, and they were willing to proceed.   Eduard Roux, MD 08/19/2022 11:14 AM

## 2022-08-19 NOTE — Anesthesia Postprocedure Evaluation (Signed)
Anesthesia Post Note  Patient: Sarah Randall  Procedure(s) Performed: LEFT KNEE ARTHROSCOPY WITH PARTIAL MEDIAL MENISECTOMY (Left: Knee) LEFT KNEE MAJOR SYNOVECTOMY (Left: Knee)     Patient location during evaluation: PACU Anesthesia Type: General Level of consciousness: awake Pain management: pain level controlled Vital Signs Assessment: post-procedure vital signs reviewed and stable Respiratory status: spontaneous breathing, nonlabored ventilation, respiratory function stable and patient connected to nasal cannula oxygen Cardiovascular status: blood pressure returned to baseline and stable Postop Assessment: no apparent nausea or vomiting Anesthetic complications: no   No notable events documented.  Last Vitals:  Vitals:   08/19/22 1345 08/19/22 1354  BP: 130/83 138/82  Pulse: 74 88  Resp: 11   Temp:  36.4 C  SpO2: 97% 95%    Last Pain:  Vitals:   08/19/22 1345  TempSrc:   PainSc: 5                  Carah Barrientes P Rhydian Baldi

## 2022-08-20 ENCOUNTER — Encounter (HOSPITAL_BASED_OUTPATIENT_CLINIC_OR_DEPARTMENT_OTHER): Payer: Self-pay | Admitting: Orthopaedic Surgery

## 2022-08-20 LAB — SURGICAL PATHOLOGY

## 2022-08-22 ENCOUNTER — Other Ambulatory Visit: Payer: Self-pay | Admitting: Internal Medicine

## 2022-08-22 DIAGNOSIS — K219 Gastro-esophageal reflux disease without esophagitis: Secondary | ICD-10-CM

## 2022-08-24 ENCOUNTER — Other Ambulatory Visit: Payer: Self-pay | Admitting: Internal Medicine

## 2022-08-24 DIAGNOSIS — K219 Gastro-esophageal reflux disease without esophagitis: Secondary | ICD-10-CM

## 2022-08-24 NOTE — Telephone Encounter (Signed)
Called patient to verify PCP. Patient reports she is seeing [place],FNP at [facility].

## 2022-08-24 NOTE — Telephone Encounter (Signed)
Requested by interface surescripts. Called patient to verify PCP. Patient now at [facility]  . esomeprazole (NEXIUM) 40 MG capsule [Pharmacy Med Name: ESOMEPRAZOLE MAG DR 40 MG CAP] 90 capsule 0    Sig: TAKE 1 CAPSULE BY MOUTH EVERY DAY     Gastroenterology: Proton Pump Inhibitors 2 Passed - 08/22/2022  1:13 AM      Passed - ALT in normal range and within 360 days    ALT  Date Value Ref Range Status  01/08/2022 19 0 - 32 IU/L Final  06/15/2019 16 0 - 44 U/L Final         Passed - AST in normal range and within 360 days    AST  Date Value Ref Range Status  01/08/2022 22 0 - 40 IU/L Final  06/15/2019 17 15 - 41 U/L Final         Passed - Valid encounter within last 12 months    Recent Outpatient Visits          6 months ago Type 2 diabetes mellitus with morbid obesity ([place])   [place] Elsie Stain, MD   7 months ago Type 2 diabetes mellitus with morbid obesity [facility])   [place] Karle Plumber B, MD   8 months ago Acute otitis media, unspecified otitis media type   Primary Care at [facility], Kriste Basque, NP   1 year ago Stressful life event affecting family   [place], Deborah B, MD   1 year ago Essential hypertension   [place], Deborah B, MD      Future Appointments            In 2 months Hilty, Nadean Corwin, MD [place] at [facility]

## 2022-08-25 NOTE — Telephone Encounter (Signed)
Pt called, advised pt that refill was received again. Pt states she contacted pharmacy yesterday and told them she had a new PCP. I advised pt that she might want to call PCP and let them know she is needing med refill and they can contact pharmacy for her. Pt verbalized understanding.   Requested Prescriptions  Pending Prescriptions Disp Refills   esomeprazole (NEXIUM) 40 MG capsule [Pharmacy Med Name: ESOMEPRAZOLE MAG DR 40 MG CAP] 90 capsule 0    Sig: TAKE 1 CAPSULE BY MOUTH EVERY DAY     Gastroenterology: Proton Pump Inhibitors 2 Passed - 08/24/2022 10:54 AM      Passed - ALT in normal range and within 360 days    ALT  Date Value Ref Range Status  01/08/2022 19 0 - 32 IU/L Final  06/15/2019 16 0 - 44 U/L Final         Passed - AST in normal range and within 360 days    AST  Date Value Ref Range Status  01/08/2022 22 0 - 40 IU/L Final  06/15/2019 17 15 - 41 U/L Final         Passed - Valid encounter within last 12 months    Recent Outpatient Visits           7 months ago Type 2 diabetes mellitus with morbid obesity ([place])   [place] Elsie Stain, MD   7 months ago Type 2 diabetes mellitus with morbid obesity [facility]   [place] Karle Plumber B, MD   8 months ago Acute otitis media, unspecified otitis media type   Primary Care at [facility], Kriste Basque, NP   1 year ago Stressful life event affecting family   [place], Deborah B, MD   1 year ago Essential hypertension   [place], Deborah B, MD       Future Appointments             In 1 month Hilty, Nadean Corwin, MD [facility]

## 2022-08-26 ENCOUNTER — Encounter: Payer: Self-pay | Admitting: Orthopaedic Surgery

## 2022-08-26 ENCOUNTER — Ambulatory Visit (INDEPENDENT_AMBULATORY_CARE_PROVIDER_SITE_OTHER): Payer: Medicare Other | Admitting: Physician Assistant

## 2022-08-26 DIAGNOSIS — Z9889 Other specified postprocedural states: Secondary | ICD-10-CM

## 2022-08-26 NOTE — Patient Outreach (Signed)
  Care Coordination   Initial Visit Note   08/26/2022 Name: Sarah Randall MRN: [id number] DOB: May 07, 1966  Sarah Randall is a 56 y.o. year old female who sees Irwin Brakeman, Higinio Roger, [place] for primary care. I spoke with  Sarah Randall by phone today.  What matters to the patients health and wellness today?  Informed pt of Care Coordination Services    Goals Addressed             This Visit's Progress    COMPLETED: Care Coordination Activities-No follow up required       Care Coordination Interventions: Active listening / Reflection utilized  Emotional Support Provided LCSW informed patient of care coordination services. Pt is not interested at this time and agreed to contact PCP, should needs arise        SDOH assessments and interventions completed:  No     Care Coordination Interventions Activated:  Yes  Care Coordination Interventions:  Yes, provided   Follow up plan: No further intervention required.   Encounter Outcome:  Pt. Refused   Christa See, MSW, [email or web address] Phone [phone] 8:38 AM

## 2022-08-26 NOTE — Patient Instructions (Signed)
Visit Information  Thank you for taking time to visit with me today. Please don't hesitate to contact me if I can be of assistance to you.   Following are the goals we discussed today:   Goals Addressed             This Visit's Progress    COMPLETED: Care Coordination Activities-No follow up required       Care Coordination Interventions: Active listening / Reflection utilized  Emotional Support Provided LCSW informed patient of care coordination services. Pt is not interested at this time and agreed to contact PCP, should needs arise          If you are experiencing a Mental Health or [place] or need someone to talk to, please call the Suicide and Crisis Lifeline: 988 call 911   Patient verbalizes understanding of instructions and care plan provided today and agrees to view in [place]. Active MyChart status and patient understanding of how to access instructions and care plan via MyChart confirmed with patient.     No follow up required  Christa See, MSW, [email or web address] Phone [phone] 8:38 AM

## 2022-08-26 NOTE — Progress Notes (Signed)
Post-Op Visit Note   Patient: Sarah Randall           Date of Birth: [birth year]           MRN: [id number] Visit Date: 08/26/2022 PCP: Beverley Fiedler, FNP   Assessment & Plan:  Chief Complaint:  Chief Complaint  Patient presents with   Left Knee - Follow-up    Left knee arthroscopy 08/19/22   Visit Diagnoses:  1. S/P arthroscopy of left knee     Plan: Patient is a pleasant 56 year old female who comes in today 1 week status post left knee arthroscopic debridement medial meniscus and chondroplasty, date of surgery 08/19/2022.  It was noted during operative invention she had grade 3-4 changes patellofemoral compartment, grade 3 changes medial compartment grade 1 to the lateral compartment.  She has been doing well.  Minimal pain.  Examination of the left knee reveals fully healed surgical portals with nylon sutures in place.  No evidence of infection or cellulitis.  Calves are soft nontender.  Today, sutures removed and Steri-Strips applied.  Intraoperative pictures reviewed.  Home exercise program provided.  Follow-up in 5 weeks for repeat evaluation.  Call with concerns or questions.  Follow-Up Instructions: Return in about 5 weeks (around 09/30/2022).   Orders:  No orders of the defined types were placed in this encounter.  No orders of the defined types were placed in this encounter.   Imaging: No new imaging  PMFS History: Patient Active Problem List   Diagnosis Date Noted   Pigmented villonodular synovitis of left knee 08/19/2022   Acute medial meniscus tear of left knee 07/21/2022   Recurrent sinusitis 01/27/2022   Trigger index finger of right hand 01/27/2022   Acute otitis media 12/23/2021   Multiple joint pain 02/10/2021   Positive ANA (antinuclear antibody) 12/13/2020   Trigger finger of left thumb 12/10/2020   Lumbar radiculopathy 11/07/2020   Chronic right-sided low back pain with right-sided sciatica 10/10/2020   Sleep disturbance 10/10/2020    Myalgia 10/10/2020   Super obese 10/10/2020   Abnormality of gait 10/10/2020   Statin intolerance 09/17/2020   Recurrent boils 09/17/2020   Chronic bilateral low back pain 05/16/2020   Postoperative state 12/27/2019   Iron deficiency 08/03/2019   B12 deficiency 06/30/2019   Chronic right shoulder pain 07/05/2018   Trochanteric bursitis of left hip 07/05/2018   Morbid obesity ([place]) 07/05/2018   Body mass index 45.0-49.9, adult ([place]) 07/05/2018   Marijuana user 05/26/2018   Status post total right knee replacement 01/12/2018   Status post total hip replacement, left 09/22/2017   Hyperlipidemia 08/10/2017   Mild depression 06/25/2017   Essential hypertension 06/25/2017   Tobacco abuse 06/25/2017   Prediabetes 06/25/2017   OSA on CPAP 06/25/2017   Bronchitis 09/24/2016   Leukocytosis 09/24/2016   Radiculopathy, lumbar region 07/16/2016   Degenerative lumbar disc 07/03/2016   Synovitis of hip 04/20/2016   Screening for breast cancer 12/28/2014   Type 2 diabetes mellitus with obesity ([place]) 04/06/2014   Pain in joint involving lower leg 05/13/2013   Past Medical History:  Diagnosis Date   Anemia    Anxiety    Arthritis    B12 deficiency 06/30/2019   Depression    GERD (gastroesophageal reflux disease)    occasionally takes OTC   History of leukocytosis    Hyperlipidemia    Hypertension    Low back pain    Obesity    Pain in right buttock    Goes  down to Right leg to lateral aspect of calf.   Pre-diabetes    Sleep apnea    tested 2014 - unable to tolerate machine   Spondylolisthesis at L4-L5 level     Family History  Problem Relation Age of Onset   Hypertension Mother    Diabetes Mother    Breast cancer Mother 2   Hypertension Father    Lung cancer Maternal Aunt    Colon polyps Brother    Colon cancer Neg Hx    Esophageal cancer Neg Hx    Rectal cancer Neg Hx    Stomach cancer Neg Hx     Past Surgical History:  Procedure Laterality Date   ABDOMINAL EXPOSURE  N/A 01/22/2021   Procedure: LATERAL EXPOSURE FOR OBLIQUE LATERAL INTERBODY FUSION;  Surgeon: Marty Heck, MD;  Location: [facility];  Service: Vascular;  Laterality: N/A;   ABDOMINAL HYSTERECTOMY     ANTERIOR LUMBAR FUSION N/A 01/22/2021   Procedure: Oblique Lateral Interbody Fusion Lumbar Four-Lumbar Five;  Surgeon: Vallarie Mare, MD;  Location: [facility];  Service: Neurosurgery;  Laterality: N/A;   KNEE ARTHROSCOPY WITH MEDIAL MENISECTOMY Left 08/19/2022   Procedure: LEFT KNEE ARTHROSCOPY WITH PARTIAL MEDIAL MENISECTOMY;  Surgeon: Leandrew Koyanagi, MD;  Location: [facility];  Service: Orthopedics;  Laterality: Left;   LEFT HEART CATH AND CORONARY ANGIOGRAPHY N/A 10/25/2018   Procedure: LEFT HEART CATH AND CORONARY ANGIOGRAPHY;  Surgeon: Martinique, Peter M, MD;  Location: [facility];  Service: Cardiovascular;  Laterality: N/A;   LUMBAR PERCUTANEOUS PEDICLE SCREW 1 LEVEL N/A 01/22/2021   Procedure: LUMBAR PERCUTANEOUS PEDICLE SCREW LUBAR FOUR- FIVE;  Surgeon: Vallarie Mare, MD;  Location: [facility];  Service: Neurosurgery;  Laterality: N/A;   ROBOTIC ASSISTED LAPAROSCOPIC HYSTERECTOMY AND SALPINGECTOMY Bilateral 12/27/2019   Procedure: XI ROBOTIC ASSISTED TOTAL LAPAROSCOPIC HYSTERECTOMY AND SALPINGECTOMY;  Surgeon: Princess Bruins, MD;  Location: [facility];  Service: Gynecology;  Laterality: Bilateral;   SYNOVECTOMY Left 08/19/2022   Procedure: LEFT KNEE MAJOR SYNOVECTOMY;  Surgeon: Leandrew Koyanagi, MD;  Location: [facility];  Service: Orthopedics;  Laterality: Left;   TOTAL HIP ARTHROPLASTY Left 09/22/2017   Procedure: LEFT TOTAL HIP ARTHROPLASTY ANTERIOR APPROACH;  Surgeon: Leandrew Koyanagi, MD;  Location: [facility];  Service: Orthopedics;  Laterality: Left;   TOTAL KNEE ARTHROPLASTY Right 01/12/2018   Procedure: RIGHT TOTAL KNEE ARTHROPLASTY;  Surgeon: Leandrew Koyanagi, MD;  Location: [facility];  Service: Orthopedics;  Laterality: Right;   TUBAL LIGATION      Social History   Occupational History   Occupation: unemployed  Tobacco Use   Smoking status: Every Day    Packs/day: 0.25    Years: 30.00    Total pack years: 7.50    Types: Cigarettes   Smokeless tobacco: Never  Vaping Use   Vaping Use: Never used  Substance and Sexual Activity   Alcohol use: Yes    Alcohol/week: 4.0 - 6.0 standard drinks of alcohol    Types: 4 - 6 Shots of liquor per week    Comment: occ   Drug use: Not Currently    Frequency: 2.0 times per week    Types: Marijuana    Comment: states she does not use marijuana any more   Sexual activity: Yes    Birth control/protection: None    Comment: declined insurance questions,des neg

## 2022-09-10 ENCOUNTER — Telehealth (HOSPITAL_BASED_OUTPATIENT_CLINIC_OR_DEPARTMENT_OTHER): Payer: Self-pay | Admitting: Internal Medicine

## 2022-09-10 NOTE — Telephone Encounter (Signed)
Pt c/o medication issue:  1. Name of Medication:  Evolocumab (REPATHA SURECLICK) 158 MG/ML SOAJ  2. How are you currently taking this medication (dosage and times per day)? Last dose taken Monday   3. Are you having a reaction (difficulty breathing--STAT)? Yes  4. What is your medication issue? Patient is calling stating she cannot take this medication anymore due to the side effects. She reports it causes muscle cramps, fatigue, a dry tongue, and the feeling of her throat swelling up. Please advise.

## 2022-09-10 NOTE — Telephone Encounter (Signed)
Please advise 

## 2022-09-11 NOTE — Telephone Encounter (Signed)
Ok .. Agree with stopping the medicine, although these side effects are rare.  We would look into altenatives - one option may be Leqvio.  Eliezer Lofts, can you look into this?  Dr. Lemmie Evens

## 2022-09-18 NOTE — Telephone Encounter (Signed)
MyChart message sent to patient.

## 2022-09-24 NOTE — Telephone Encounter (Signed)
Left message for patient to call back to discuss.

## 2022-09-25 ENCOUNTER — Other Ambulatory Visit: Payer: Self-pay | Admitting: Internal Medicine

## 2022-09-25 DIAGNOSIS — G250 Essential tremor: Secondary | ICD-10-CM

## 2022-09-25 DIAGNOSIS — G43011 Migraine without aura, intractable, with status migrainosus: Secondary | ICD-10-CM

## 2022-10-01 ENCOUNTER — Encounter: Payer: Self-pay | Admitting: Orthopaedic Surgery

## 2022-10-01 ENCOUNTER — Ambulatory Visit (INDEPENDENT_AMBULATORY_CARE_PROVIDER_SITE_OTHER): Payer: Medicare Other | Admitting: Physician Assistant

## 2022-10-01 DIAGNOSIS — Z9889 Other specified postprocedural states: Secondary | ICD-10-CM

## 2022-10-01 DIAGNOSIS — G8929 Other chronic pain: Secondary | ICD-10-CM

## 2022-10-01 DIAGNOSIS — M25562 Pain in left knee: Secondary | ICD-10-CM

## 2022-10-01 MED ORDER — PREDNISONE 10 MG (21) PO TBPK: ORAL_TABLET | ORAL | 0 refills | Status: DC

## 2022-10-01 NOTE — Progress Notes (Signed)
Post-Op Visit Note   Patient: Sarah Randall           Date of Birth: [birth year]           MRN: [id number] Visit Date: 10/01/2022 PCP: Beverley Fiedler, FNP   Assessment & Plan:  Chief Complaint:  Chief Complaint  Patient presents with   Left Knee - Follow-up    Left knee arthroscopy 08/19/2022   Visit Diagnoses:  1. Chronic pain of left knee   2. S/P left knee arthroscopy     Plan: Patient is a pleasant 56 year old female who comes in today 6 weeks status post left knee arthroscopic debridement medial meniscus and chondroplasty, date of surgery 08/19/2022.  It was noted that she had grade 3 and 4 changes the patellofemoral compartment grade 3 changes to the medial compartment.  She notes over the past few weeks that her swelling has improved but she still can continues to experience pain throughout the entire knee.  Pain is worse with walking as well as at night when she is trying to sleep.  She is on chronic oxycodone and takes muscle relaxers occasionally only with minimal relief.  Examination of her left knee reveals fully healed surgical portals without complication.  She does have a small effusion.  Range of motion 0 to 100 degrees.  Calf is soft and nontender.  She is neurovascular intact distally.  At this point, I discussed the degree of underlying osteoarthritis that was seen during surgical intervention and I believe this is what is bothering her at this time.  We have discussed proceeding with cortisone injection but she notes that these have caused muscle cramps in the past and is not interested at this time.  I have agreed to call in a steroid taper.  We have also talked about weight loss and applying ice and backing off activity.  She is aware that at some point she will likely need a total knee replacement.  Follow-up as needed.  Follow-Up Instructions: Return if symptoms worsen or fail to improve.   Orders:  No orders of the defined types were placed in this  encounter.  Meds ordered this encounter  Medications   predniSONE (STERAPRED UNI-PAK 21 TAB) 10 MG (21) TBPK tablet    Sig: Take as directed    Dispense:  21 tablet    Refill:  0    Imaging: No new imaging  PMFS History: Patient Active Problem List   Diagnosis Date Noted   Pigmented villonodular synovitis of left knee 08/19/2022   Acute medial meniscus tear of left knee 07/21/2022   Recurrent sinusitis 01/27/2022   Trigger index finger of right hand 01/27/2022   Acute otitis media 12/23/2021   Multiple joint pain 02/10/2021   Positive ANA (antinuclear antibody) 12/13/2020   Trigger finger of left thumb 12/10/2020   Lumbar radiculopathy 11/07/2020   Chronic right-sided low back pain with right-sided sciatica 10/10/2020   Sleep disturbance 10/10/2020   Myalgia 10/10/2020   Super obese 10/10/2020   Abnormality of gait 10/10/2020   Statin intolerance 09/17/2020   Recurrent boils 09/17/2020   Chronic bilateral low back pain 05/16/2020   Postoperative state 12/27/2019   Iron deficiency 08/03/2019   B12 deficiency 06/30/2019   Chronic right shoulder pain 07/05/2018   Trochanteric bursitis of left hip 07/05/2018   Morbid obesity ([place]) 07/05/2018   Body mass index 45.0-49.9, adult ([place]) 07/05/2018   Marijuana user 05/26/2018   Status post total right knee replacement 01/12/2018  Status post total hip replacement, left 09/22/2017   Hyperlipidemia 08/10/2017   Mild depression 06/25/2017   Essential hypertension 06/25/2017   Tobacco abuse 06/25/2017   Prediabetes 06/25/2017   OSA on CPAP 06/25/2017   Bronchitis 09/24/2016   Leukocytosis 09/24/2016   Radiculopathy, lumbar region 07/16/2016   Degenerative lumbar disc 07/03/2016   Synovitis of hip 04/20/2016   Screening for breast cancer 12/28/2014   Type 2 diabetes mellitus with obesity ([place]) 04/06/2014   Pain in joint involving lower leg 05/13/2013   Past Medical History:  Diagnosis Date   Anemia    Anxiety     Arthritis    B12 deficiency 06/30/2019   Depression    GERD (gastroesophageal reflux disease)    occasionally takes OTC   History of leukocytosis    Hyperlipidemia    Hypertension    Low back pain    Obesity    Pain in right buttock    Goes down to Right leg to lateral aspect of calf.   Pre-diabetes    Sleep apnea    tested 2014 - unable to tolerate machine   Spondylolisthesis at L4-L5 level     Family History  Problem Relation Age of Onset   Hypertension Mother    Diabetes Mother    Breast cancer Mother 37   Hypertension Father    Lung cancer Maternal Aunt    Colon polyps Brother    Colon cancer Neg Hx    Esophageal cancer Neg Hx    Rectal cancer Neg Hx    Stomach cancer Neg Hx     Past Surgical History:  Procedure Laterality Date   ABDOMINAL EXPOSURE N/A 01/22/2021   Procedure: LATERAL EXPOSURE FOR OBLIQUE LATERAL INTERBODY FUSION;  Surgeon: Marty Heck, MD;  Location: [facility];  Service: Vascular;  Laterality: N/A;   ABDOMINAL HYSTERECTOMY     ANTERIOR LUMBAR FUSION N/A 01/22/2021   Procedure: Oblique Lateral Interbody Fusion Lumbar Four-Lumbar Five;  Surgeon: Vallarie Mare, MD;  Location: [facility];  Service: Neurosurgery;  Laterality: N/A;   KNEE ARTHROSCOPY WITH MEDIAL MENISECTOMY Left 08/19/2022   Procedure: LEFT KNEE ARTHROSCOPY WITH PARTIAL MEDIAL MENISECTOMY;  Surgeon: Leandrew Koyanagi, MD;  Location: [facility];  Service: Orthopedics;  Laterality: Left;   LEFT HEART CATH AND CORONARY ANGIOGRAPHY N/A 10/25/2018   Procedure: LEFT HEART CATH AND CORONARY ANGIOGRAPHY;  Surgeon: Martinique, Peter M, MD;  Location: [facility];  Service: Cardiovascular;  Laterality: N/A;   LUMBAR PERCUTANEOUS PEDICLE SCREW 1 LEVEL N/A 01/22/2021   Procedure: LUMBAR PERCUTANEOUS PEDICLE SCREW LUBAR FOUR- FIVE;  Surgeon: Vallarie Mare, MD;  Location: [facility];  Service: Neurosurgery;  Laterality: N/A;   ROBOTIC ASSISTED LAPAROSCOPIC HYSTERECTOMY AND SALPINGECTOMY  Bilateral 12/27/2019   Procedure: XI ROBOTIC ASSISTED TOTAL LAPAROSCOPIC HYSTERECTOMY AND SALPINGECTOMY;  Surgeon: Princess Bruins, MD;  Location: [facility];  Service: Gynecology;  Laterality: Bilateral;   SYNOVECTOMY Left 08/19/2022   Procedure: LEFT KNEE MAJOR SYNOVECTOMY;  Surgeon: Leandrew Koyanagi, MD;  Location: [facility];  Service: Orthopedics;  Laterality: Left;   TOTAL HIP ARTHROPLASTY Left 09/22/2017   Procedure: LEFT TOTAL HIP ARTHROPLASTY ANTERIOR APPROACH;  Surgeon: Leandrew Koyanagi, MD;  Location: [facility];  Service: Orthopedics;  Laterality: Left;   TOTAL KNEE ARTHROPLASTY Right 01/12/2018   Procedure: RIGHT TOTAL KNEE ARTHROPLASTY;  Surgeon: Leandrew Koyanagi, MD;  Location: [facility];  Service: Orthopedics;  Laterality: Right;   TUBAL LIGATION  Social History   Occupational History   Occupation: unemployed  Tobacco Use   Smoking status: Every Day    Packs/day: 0.25    Years: 30.00    Total pack years: 7.50    Types: Cigarettes   Smokeless tobacco: Never  Vaping Use   Vaping Use: Never used  Substance and Sexual Activity   Alcohol use: Yes    Alcohol/week: 4.0 - 6.0 standard drinks of alcohol    Types: 4 - 6 Shots of liquor per week    Comment: occ   Drug use: Not Currently    Frequency: 2.0 times per week    Types: Marijuana    Comment: states she does not use marijuana any more   Sexual activity: Yes    Birth control/protection: None    Comment: declined insurance questions,des neg

## 2022-10-23 ENCOUNTER — Ambulatory Visit (HOSPITAL_BASED_OUTPATIENT_CLINIC_OR_DEPARTMENT_OTHER): Payer: Medicaid - Dental | Admitting: Internal Medicine

## 2022-11-22 ENCOUNTER — Other Ambulatory Visit: Payer: Self-pay | Admitting: Internal Medicine

## 2022-11-22 DIAGNOSIS — F419 Anxiety disorder, unspecified: Secondary | ICD-10-CM

## 2022-11-24 ENCOUNTER — Ambulatory Visit: Payer: Medicaid - Dental | Admitting: Orthopaedic Surgery

## 2022-11-26 ENCOUNTER — Ambulatory Visit (INDEPENDENT_AMBULATORY_CARE_PROVIDER_SITE_OTHER): Payer: Medicare Other | Admitting: Sports Medicine

## 2022-11-26 ENCOUNTER — Ambulatory Visit (INDEPENDENT_AMBULATORY_CARE_PROVIDER_SITE_OTHER): Payer: Medicare Other

## 2022-11-26 ENCOUNTER — Ambulatory Visit: Payer: Self-pay

## 2022-11-26 ENCOUNTER — Ambulatory Visit (INDEPENDENT_AMBULATORY_CARE_PROVIDER_SITE_OTHER): Payer: Medicare Other | Admitting: Orthopaedic Surgery

## 2022-11-26 ENCOUNTER — Encounter: Payer: Self-pay | Admitting: Orthopaedic Surgery

## 2022-11-26 DIAGNOSIS — M25551 Pain in right hip: Secondary | ICD-10-CM

## 2022-11-26 MED ORDER — LIDOCAINE HCL 1 % IJ SOLN
4.0000 mL | INTRAMUSCULAR | Status: AC | PRN
  Administered 2022-11-26: 4 mL

## 2022-11-26 MED ORDER — METHYLPREDNISOLONE ACETATE 40 MG/ML IJ SUSP
80.0000 mg | INTRAMUSCULAR | Status: AC | PRN
  Administered 2022-11-26: 80 mg via INTRA_ARTICULAR

## 2022-11-26 NOTE — Progress Notes (Signed)
   Procedure Note  Patient: Sarah Randall             Date of Birth: 1966-05-14           MRN: [id number]             Visit Date: 11/26/2022  Procedures: Visit Diagnoses:  1. Pain in right hip    Large Joint Inj: R hip joint on 11/26/2022 10:53 AM Indications: pain Details: 22 G 3.5 in needle, ultrasound-guided anterior approach Medications: 4 mL lidocaine 1 %; 80 mg methylPREDNISolone acetate 40 MG/ML Outcome: tolerated well, no immediate complications  Procedure: US-guided intra-articular hip injection, right After discussion on risks/benefits/indications and informed verbal consent was obtained, a timeout was performed. Patient was lying supine on exam table. The hip was cleaned with betadine and alcohol swabs. Then utilizing ultrasound guidance, the patient's femoral head and neck junction was identified and subsequently injected with 4:2 lidocaine:depomedrol via an in-plane approach with ultrasound visualization of the injectate administered into the hip joint. Patient tolerated procedure well without immediate complications.  Procedure, treatment alternatives, risks and benefits explained, specific risks discussed. Consent was given by the patient. Immediately prior to procedure a time out was called to verify the correct patient, procedure, equipment, support staff and site/side marked as required. Patient was prepped and draped in the usual sterile fashion.     - I evaluated the patient about 10 minutes post-injection and she  had improvement in pain and range of motion - follow-up with Dr. Erlinda Hong as indicated; I am happy to see them as needed  Elba Barman, DO [place]  This note was dictated using Dragon naturally speaking software and may contain errors in syntax, spelling, or content which have not been identified prior to signing this note.

## 2022-11-26 NOTE — Progress Notes (Signed)
Office Visit Note   Patient: Sarah Randall           Date of Birth: [birth year]           MRN: [id number] Visit Date: 11/26/2022              Requested by: Beverley Fiedler, [place],  [place] [address] PCP: Beverley Fiedler, FNP   Assessment & Plan: Visit Diagnoses:  1. Pain in right hip     Plan: Impression is chronic right hip pain.  Difficult to say if it is coming from the hip joint or her back.  We agreed to try a diagnostic intra-articular hip injection today.  We sent her to Dr. Rolena Infante for this.  She will follow-up as needed.  Follow-Up Instructions: No follow-ups on file.   Orders:  Orders Placed This Encounter  Procedures   XR HIP UNILAT W OR W/O PELVIS 2-3 VIEWS RIGHT   No orders of the defined types were placed in this encounter.     Procedures: No procedures performed   Clinical Data: No additional findings.   Subjective: Chief Complaint  Patient presents with   Right Hip - Pain    HPI Ms. Simson comes in today for right hip pain.  She is experiencing pulling sensation in the thigh and down into the knee and calf area.  She has chronic back issues as well as had lumbar spine fusion.  Denies any groin pain.  Has difficulty laying flat.  Does feel some numbness feeling with walking. Review of Systems  Constitutional: Negative.   HENT: Negative.    Eyes: Negative.   Respiratory: Negative.    Cardiovascular: Negative.   Endocrine: Negative.   Musculoskeletal: Negative.   Neurological: Negative.   Hematological: Negative.   Psychiatric/Behavioral: Negative.    All other systems reviewed and are negative.    Objective: Vital Signs: LMP 10/10/2019   Physical Exam Vitals and nursing note reviewed.  Constitutional:      Appearance: She is well-developed.  HENT:     Head: Atraumatic.     Nose: Nose normal.  Eyes:     Extraocular Movements: Extraocular movements intact.  Cardiovascular:     Pulses: Normal pulses.   Pulmonary:     Effort: Pulmonary effort is normal.  Abdominal:     Palpations: Abdomen is soft.  Musculoskeletal:     Cervical back: Neck supple.  Skin:    General: Skin is warm.     Capillary Refill: Capillary refill takes less than 2 seconds.  Neurological:     Mental Status: She is alert. Mental status is at baseline.  Psychiatric:        Behavior: Behavior normal.        Thought Content: Thought content normal.        Judgment: Judgment normal.     Ortho Exam Examination of the right hip shows no significant tenderness to the trochanter.  No pain with logroll.  She has thigh pain with hip flexion and rotation.  No overt sciatic tension signs. Specialty Comments:  No specialty comments available.  Imaging: XR HIP UNILAT W OR W/O PELVIS 2-3 VIEWS RIGHT  Result Date: 11/26/2022 Moderate joint space narrowing of the right hip.  No acute abnormalities.    PMFS History: Patient Active Problem List   Diagnosis Date Noted   Pigmented villonodular synovitis of left knee 08/19/2022   Acute medial meniscus tear of left knee 07/21/2022   Recurrent sinusitis  01/27/2022   Trigger index finger of right hand 01/27/2022   Acute otitis media 12/23/2021   Multiple joint pain 02/10/2021   Positive ANA (antinuclear antibody) 12/13/2020   Trigger finger of left thumb 12/10/2020   Lumbar radiculopathy 11/07/2020   Chronic right-sided low back pain with right-sided sciatica 10/10/2020   Sleep disturbance 10/10/2020   Myalgia 10/10/2020   Super obese 10/10/2020   Abnormality of gait 10/10/2020   Statin intolerance 09/17/2020   Recurrent boils 09/17/2020   Chronic bilateral low back pain 05/16/2020   Postoperative state 12/27/2019   Iron deficiency 08/03/2019   B12 deficiency 06/30/2019   Chronic right shoulder pain 07/05/2018   Trochanteric bursitis of left hip 07/05/2018   Morbid obesity ([place]) 07/05/2018   Body mass index 45.0-49.9, adult ([place]) 07/05/2018   Marijuana user  05/26/2018   Status post total right knee replacement 01/12/2018   Status post total hip replacement, left 09/22/2017   Hyperlipidemia 08/10/2017   Mild depression 06/25/2017   Essential hypertension 06/25/2017   Tobacco abuse 06/25/2017   Prediabetes 06/25/2017   OSA on CPAP 06/25/2017   Bronchitis 09/24/2016   Leukocytosis 09/24/2016   Radiculopathy, lumbar region 07/16/2016   Degenerative lumbar disc 07/03/2016   Synovitis of hip 04/20/2016   Screening for breast cancer 12/28/2014   Type 2 diabetes mellitus with obesity ([place]) 04/06/2014   Pain in joint involving lower leg 05/13/2013   Past Medical History:  Diagnosis Date   Anemia    Anxiety    Arthritis    B12 deficiency 06/30/2019   Depression    GERD (gastroesophageal reflux disease)    occasionally takes OTC   History of leukocytosis    Hyperlipidemia    Hypertension    Low back pain    Obesity    Pain in right buttock    Goes down to Right leg to lateral aspect of calf.   Pre-diabetes    Sleep apnea    tested 2014 - unable to tolerate machine   Spondylolisthesis at L4-L5 level     Family History  Problem Relation Age of Onset   Hypertension Mother    Diabetes Mother    Breast cancer Mother 103   Hypertension Father    Lung cancer Maternal Aunt    Colon polyps Brother    Colon cancer Neg Hx    Esophageal cancer Neg Hx    Rectal cancer Neg Hx    Stomach cancer Neg Hx     Past Surgical History:  Procedure Laterality Date   ABDOMINAL EXPOSURE N/A 01/22/2021   Procedure: LATERAL EXPOSURE FOR OBLIQUE LATERAL INTERBODY FUSION;  Surgeon: Marty Heck, MD;  Location: [facility];  Service: Vascular;  Laterality: N/A;   ABDOMINAL HYSTERECTOMY     ANTERIOR LUMBAR FUSION N/A 01/22/2021   Procedure: Oblique Lateral Interbody Fusion Lumbar Four-Lumbar Five;  Surgeon: Vallarie Mare, MD;  Location: [facility];  Service: Neurosurgery;  Laterality: N/A;   KNEE ARTHROSCOPY WITH MEDIAL MENISECTOMY Left 08/19/2022    Procedure: LEFT KNEE ARTHROSCOPY WITH PARTIAL MEDIAL MENISECTOMY;  Surgeon: Leandrew Koyanagi, MD;  Location: [facility];  Service: Orthopedics;  Laterality: Left;   LEFT HEART CATH AND CORONARY ANGIOGRAPHY N/A 10/25/2018   Procedure: LEFT HEART CATH AND CORONARY ANGIOGRAPHY;  Surgeon: Martinique, Peter M, MD;  Location: [facility];  Service: Cardiovascular;  Laterality: N/A;   LUMBAR PERCUTANEOUS PEDICLE SCREW 1 LEVEL N/A 01/22/2021   Procedure: LUMBAR PERCUTANEOUS PEDICLE SCREW LUBAR FOUR- FIVE;  Surgeon: Vallarie Mare, MD;  Location: [facility];  Service: Neurosurgery;  Laterality: N/A;   ROBOTIC ASSISTED LAPAROSCOPIC HYSTERECTOMY AND SALPINGECTOMY Bilateral 12/27/2019   Procedure: XI ROBOTIC ASSISTED TOTAL LAPAROSCOPIC HYSTERECTOMY AND SALPINGECTOMY;  Surgeon: Princess Bruins, MD;  Location: [facility];  Service: Gynecology;  Laterality: Bilateral;   SYNOVECTOMY Left 08/19/2022   Procedure: LEFT KNEE MAJOR SYNOVECTOMY;  Surgeon: Leandrew Koyanagi, MD;  Location: [facility];  Service: Orthopedics;  Laterality: Left;   TOTAL HIP ARTHROPLASTY Left 09/22/2017   Procedure: LEFT TOTAL HIP ARTHROPLASTY ANTERIOR APPROACH;  Surgeon: Leandrew Koyanagi, MD;  Location: [facility];  Service: Orthopedics;  Laterality: Left;   TOTAL KNEE ARTHROPLASTY Right 01/12/2018   Procedure: RIGHT TOTAL KNEE ARTHROPLASTY;  Surgeon: Leandrew Koyanagi, MD;  Location: [facility];  Service: Orthopedics;  Laterality: Right;   TUBAL LIGATION     Social History   Occupational History   Occupation: unemployed  Tobacco Use   Smoking status: Every Day    Packs/day: 0.25    Years: 30.00    Total pack years: 7.50    Types: Cigarettes   Smokeless tobacco: Never  Vaping Use   Vaping Use: Never used  Substance and Sexual Activity   Alcohol use: Yes    Alcohol/week: 4.0 - 6.0 standard drinks of alcohol    Types: 4 - 6 Shots of liquor per week    Comment: occ   Drug use: Not Currently    Frequency:  2.0 times per week    Types: Marijuana    Comment: states she does not use marijuana any more   Sexual activity: Yes    Birth control/protection: None    Comment: declined insurance questions,des neg

## 2022-12-15 ENCOUNTER — Other Ambulatory Visit: Payer: Self-pay | Admitting: Neurosurgery

## 2022-12-15 DIAGNOSIS — M5416 Radiculopathy, lumbar region: Secondary | ICD-10-CM

## 2022-12-24 ENCOUNTER — Encounter: Payer: Self-pay | Admitting: *Deleted

## 2023-01-02 ENCOUNTER — Encounter: Payer: Self-pay | Admitting: Internal Medicine

## 2023-01-15 ENCOUNTER — Ambulatory Visit
Admission: RE | Admit: 2023-01-15 | Discharge: 2023-01-15 | Disposition: A | Discharge status: Home / Self Care | Payer: 59 | Source: Ambulatory Visit | Attending: Neurosurgery | Admitting: Neurosurgery

## 2023-01-15 DIAGNOSIS — M5416 Radiculopathy, lumbar region: Secondary | ICD-10-CM

## 2023-04-08 ENCOUNTER — Emergency Department (HOSPITAL_BASED_OUTPATIENT_CLINIC_OR_DEPARTMENT_OTHER)
Admission: EM | Admit: 2023-04-08 | Discharge: 2023-04-09 | Disposition: A | Discharge status: Home / Self Care | Payer: 59 | Attending: Emergency Medicine | Admitting: Emergency Medicine

## 2023-04-08 ENCOUNTER — Other Ambulatory Visit: Payer: Self-pay

## 2023-04-08 ENCOUNTER — Emergency Department (HOSPITAL_BASED_OUTPATIENT_CLINIC_OR_DEPARTMENT_OTHER): Payer: 59

## 2023-04-08 DIAGNOSIS — I10 Essential (primary) hypertension: Secondary | ICD-10-CM | POA: Insufficient documentation

## 2023-04-08 DIAGNOSIS — D72829 Elevated white blood cell count, unspecified: Secondary | ICD-10-CM | POA: Insufficient documentation

## 2023-04-08 DIAGNOSIS — R109 Unspecified abdominal pain: Secondary | ICD-10-CM | POA: Diagnosis present

## 2023-04-08 LAB — COMPREHENSIVE METABOLIC PANEL
ALT: 14 U/L (ref 0–44)
AST: 18 U/L (ref 15–41)
Albumin: 4.2 g/dL (ref 3.5–5.0)
Alkaline Phosphatase: 82 U/L (ref 38–126)
Anion gap: 11 (ref 5–15)
BUN: 16 mg/dL (ref 6–20)
CO2: 25 mmol/L (ref 22–32)
Calcium: 9.1 mg/dL (ref 8.9–10.3)
Chloride: 98 mmol/L (ref 98–111)
Creatinine, Ser: 1.1 mg/dL — ABNORMAL HIGH (ref 0.44–1.00)
GFR, Estimated: 59 mL/min — ABNORMAL LOW (ref >60)
Glucose, Bld: 93 mg/dL (ref 70–99)
Potassium: 3.3 mmol/L — ABNORMAL LOW (ref 3.5–5.1)
Sodium: 134 mmol/L — ABNORMAL LOW (ref 135–145)
Total Bilirubin: 0.4 mg/dL (ref 0.3–1.2)
Total Protein: 8 g/dL (ref 6.5–8.1)

## 2023-04-08 LAB — URINALYSIS, MICROSCOPIC (REFLEX)

## 2023-04-08 LAB — URINALYSIS, ROUTINE W REFLEX MICROSCOPIC
Bilirubin Urine: NEGATIVE
Glucose, UA: >=500 mg/dL — AB
Ketones, ur: NEGATIVE mg/dL
Leukocytes,Ua: NEGATIVE
Nitrite: NEGATIVE
Protein, ur: NEGATIVE mg/dL
Specific Gravity, Urine: 1.02 (ref 1.005–1.030)
pH: 6 (ref 5.0–8.0)

## 2023-04-08 LAB — CBC WITH DIFFERENTIAL/PLATELET
Abs Immature Granulocytes: 0.05 10*3/uL (ref 0.00–0.07)
Basophils Absolute: 0.1 10*3/uL (ref 0.0–0.1)
Basophils Relative: 1 %
Eosinophils Absolute: 0.4 10*3/uL (ref 0.0–0.5)
Eosinophils Relative: 3 %
HCT: 44.7 % (ref 36.0–46.0)
Hemoglobin: 15.2 g/dL — ABNORMAL HIGH (ref 12.0–15.0)
Immature Granulocytes: 0 %
Lymphocytes Relative: 36 %
Lymphs Abs: 4.5 10*3/uL — ABNORMAL HIGH (ref 0.7–4.0)
MCH: 30.8 pg (ref 26.0–34.0)
MCHC: 34 g/dL (ref 30.0–36.0)
MCV: 90.7 fL (ref 80.0–100.0)
Monocytes Absolute: 0.8 10*3/uL (ref 0.1–1.0)
Monocytes Relative: 6 %
Neutro Abs: 6.8 10*3/uL (ref 1.7–7.7)
Neutrophils Relative %: 54 %
Platelets: 375 10*3/uL (ref 150–400)
RBC: 4.93 MIL/uL (ref 3.87–5.11)
RDW: 13.7 % (ref 11.5–15.5)
WBC: 12.5 10*3/uL — ABNORMAL HIGH (ref 4.0–10.5)
nRBC: 0 % (ref 0.0–0.2)

## 2023-04-08 LAB — LIPASE, BLOOD: Lipase: 41 U/L (ref 11–51)

## 2023-04-08 MED ORDER — KETOROLAC TROMETHAMINE 30 MG/ML IJ SOLN
30.0000 mg | Freq: Once | INTRAMUSCULAR | Status: AC
  Administered 2023-04-08: 30 mg via INTRAMUSCULAR
  Filled 2023-04-08: qty 1

## 2023-04-08 NOTE — ED Notes (Signed)
Pt reports unable to urinate at this time, will recheck later

## 2023-04-08 NOTE — ED Provider Notes (Signed)
  Provider Note MRN:  [id number]  Arrival date & time: 04/08/23    ED Course and Medical Decision Making  Assumed care from Dr. Jacqulyn Bath at shift change.  Acute on chronic back pain, reassuring CT scan awaiting urinalysis.  Plan is for her to follow-up with her pain management doctor.  Procedures  Final Clinical Impressions(s) / ED Diagnoses     ICD-10-CM   1. Right flank pain  R10.9       ED Discharge Orders     None         Discharge Instructions      You were evaluated in the Emergency Department and after careful evaluation, we did not find any emergent condition requiring admission or further testing in the hospital.  Your exam/testing today was overall reassuring.  Recommend following up with your pain management doctor to discuss your symptoms.  Please return to the Emergency Department if you experience any worsening of your condition.  Thank you for allowing [place] to be a part of your care.       Elmer Sow. Pilar Plate, MD [facility] Emergency Medicine [facility] [email or web address]    Sabas Sous, MD 04/08/23 [phone]

## 2023-04-08 NOTE — ED Provider Notes (Signed)
Emergency Department Provider Note   I have reviewed the triage vital signs and the nursing notes.   HISTORY  Chief Complaint Flank Pain   HPI Sarah Randall is a 57 y.o. female past history reviewed below including chronic neck and back pain, currently under the care of pain management, presents with continued right flank pain.  She has had intermittent right flank pain for months to years but over the past 3 days she feels its gotten worse.  No dysuria, hesitancy, urgency.  She is taking her home medications without relief.  No numbness or weakness in the arms or legs.  No injuries.  No vomiting or diarrhea.  She had a gastric study in the [facility] system and April, but does not know the results.  No recent CT imaging. No kidney stone history.   Past Medical History:  Diagnosis Date   Anemia    Anxiety    Arthritis    B12 deficiency 06/30/2019   Depression    GERD (gastroesophageal reflux disease)    occasionally takes OTC   History of leukocytosis    Hyperlipidemia    Hypertension    Low back pain    Obesity    Pain in right buttock    Goes down to Right leg to lateral aspect of calf.   Pre-diabetes    Sleep apnea    tested 2014 - unable to tolerate machine   Spondylolisthesis at L4-L5 level     Review of Systems  Constitutional: No fever/chills Cardiovascular: Denies chest pain. Respiratory: Denies shortness of breath. Gastrointestinal: Positive right flank/abdominal pain.  No nausea, no vomiting.  No diarrhea.  No constipation. Genitourinary: Negative for dysuria. Musculoskeletal: Chronic back pain. Skin: Negative for rash. Neurological: Negative for headaches, focal weakness or numbness.   ____________________________________________   PHYSICAL EXAM:  VITAL SIGNS: ED Triage Vitals  Enc Vitals Group     BP 04/08/23 2121 119/81     Pulse Rate 04/08/23 2121 97     Resp 04/08/23 2121 20     Temp 04/08/23 2121 97.7 F (36.5 C)     Temp Source  04/08/23 2121 Oral     SpO2 04/08/23 2121 100 %     Weight 04/08/23 2122 279 lb (126.6 kg)     Height 04/08/23 2122 5\' 4"  (1.626 m)   Constitutional: Alert and oriented. Well appearing and in no acute distress. Eyes: Conjunctivae are normal.  Head: Atraumatic. Nose: No congestion/rhinnorhea. Mouth/Throat: Mucous membranes are moist.   Neck: No stridor.   Cardiovascular: Normal rate, regular rhythm. Good peripheral circulation. Grossly normal heart sounds.   Respiratory: Normal respiratory effort.  No retractions. Lungs CTAB. Gastrointestinal: Soft and nontender. No distention.  Musculoskeletal: No lower extremity tenderness nor edema. No gross deformities of extremities. Neurologic:  Normal speech and language. No gross focal neurologic deficits are appreciated.  Skin:  Skin is warm, dry and intact. No rash noted.  ____________________________________________   LABS (all labs ordered are listed, but only abnormal results are displayed)  Labs Reviewed  COMPREHENSIVE METABOLIC PANEL - Abnormal; Notable for the following components:      Result Value   Sodium 134 (*)    Potassium 3.3 (*)    Creatinine, Ser 1.10 (*)    GFR, Estimated 59 (*)    All other components within normal limits  CBC WITH DIFFERENTIAL/PLATELET - Abnormal; Notable for the following components:   WBC 12.5 (*)    Hemoglobin 15.2 (*)    Lymphs  Abs 4.5 (*)    All other components within normal limits  LIPASE, BLOOD  URINALYSIS, ROUTINE W REFLEX MICROSCOPIC   ____________________________________________  RADIOLOGY  CT Renal Stone Study  Result Date: 04/08/2023 CLINICAL DATA:  Abdominal and flank pain, right-sided. EXAM: CT ABDOMEN AND PELVIS WITHOUT CONTRAST TECHNIQUE: Multidetector CT imaging of the abdomen and pelvis was performed following the standard protocol without IV contrast. RADIATION DOSE REDUCTION: This exam was performed according to the departmental dose-optimization program which includes  automated exposure control, adjustment of the mA and/or kV according to patient size and/or use of iterative reconstruction technique. COMPARISON:  CT abdomen and pelvis 10/10/2014 FINDINGS: Lower chest: No acute abnormality. Hepatobiliary: No focal liver abnormality is seen. No gallstones, gallbladder wall thickening, or biliary dilatation. Pancreas: Unremarkable. No pancreatic ductal dilatation or surrounding inflammatory changes. Spleen: Normal in size without focal abnormality. Adrenals/Urinary Tract: There is a low-density right adrenal nodule measuring 15 mm compatible with adenoma. The left adrenal gland, kidneys and bladder are within normal limits. Stomach/Bowel: Stomach is within normal limits. Appendix appears normal. No evidence of bowel wall thickening, distention, or inflammatory changes. There is sigmoid colon diverticulosis. Vascular/Lymphatic: Aortic atherosclerosis. No enlarged abdominal or pelvic lymph nodes. Reproductive: Status post hysterectomy. No adnexal masses. Other: No abdominal wall hernia or abnormality. No abdominopelvic ascites. Musculoskeletal: L4-L5 posterior fusion hardware is present. Left hip arthroplasty is present. IMPRESSION: 1. No acute localizing process in the abdomen or pelvis. 2. Sigmoid colon diverticulosis without evidence for diverticulitis. 3. Right adrenal adenoma. Aortic Atherosclerosis (ICD10-I70.0). Electronically Signed   By: Darliss Cheney M.D.   On: 04/08/2023 22:09    ____________________________________________   PROCEDURES  Procedure(s) performed:   Procedures  None  ____________________________________________   INITIAL IMPRESSION / ASSESSMENT AND PLAN / ED COURSE  Pertinent labs & imaging results that were available during my care of the patient were reviewed by me and considered in my medical decision making (see chart for details).   This patient is Presenting for Evaluation of flank pain, which does require a range of treatment options,  and is a complaint that involves a high risk of morbidity and mortality.  The Differential Diagnoses includes but is not exclusive to acute cholecystitis, intrathoracic causes for epigastric abdominal pain, gastritis, duodenitis, pancreatitis, small bowel or large bowel obstruction, abdominal aortic aneurysm, hernia, gastritis, etc.   Critical Interventions-    Medications  ketorolac (TORADOL) 30 MG/ML injection 30 mg (30 mg Intramuscular Given 04/08/23 2205)    Reassessment after intervention: pain improved.   I decided to review pertinent External Data, and in summary fluoroscopy with double contrast/KUB performed on 03/10/2023 and interpreted as normal in the [facility] system.   Clinical Laboratory Tests Ordered, included CBC with leukocytosis to 12.5.  No anemia.  No acute kidney injury.  LFTs, bilirubin, lipase normal.   Radiologic Tests Ordered, included CT renal. I independently interpreted the images and agree with radiology interpretation.   Cardiac Monitor Tracing which shows NSR.    Social Determinants of Health Risk patient is a non-smoker.   Medical Decision Making: Summary:  Patient presents emergency department with right flank pain which is acute on chronic.  No neurodeficits to suspect acute spine emergency or prompt emergent MRI.  Plan for CT renal, labs, UA and reassess. She is followed by pain mgmt.   Reevaluation with update and discussion with patient.  CT abdomen pelvis reassuring.  Labs significant only for mild leukocytosis on CBC.  UA pending.  Patient given water to drink  to try and help with this.  No UTI symptoms but given location of pain I feel we should follow this.  If negative, patient will discuss the possibility of radicular pain from her cervical/upper lumbar spine with her pain management and spine teams.   Care transferred to Dr. Pilar Plate pending UA.   Patient's presentation is most consistent with exacerbation of chronic illness.   Disposition: pending    ____________________________________________  FINAL CLINICAL IMPRESSION(S) / ED DIAGNOSES  Final diagnoses:  Right flank pain    Note:  This document was prepared using Dragon voice recognition software and may include unintentional dictation errors.  Alona Bene, MD, [facility] Emergency Medicine    Keelee Yankey, Arlyss Repress, MD 04/09/23 [phone]

## 2023-04-08 NOTE — ED Triage Notes (Signed)
Patient coming to ED for evaluation of R flank pain x 3 days.  Hx of same in the past with no dx.  No hx of kidney stones.  No dysuria.  No reports of injury or heavy lifting recently.  States pain "comes and goes.  Sometimes it is really sharp."

## 2023-04-08 NOTE — Discharge Instructions (Signed)
You were evaluated in the Emergency Department and after careful evaluation, we did not find any emergent condition requiring admission or further testing in the hospital.  Your exam/testing today was overall reassuring.  Recommend following up with your pain management doctor to discuss your symptoms.  Please return to the Emergency Department if you experience any worsening of your condition.  Thank you for allowing [place] to be a part of your care.

## 2023-04-26 ENCOUNTER — Other Ambulatory Visit: Payer: Self-pay

## 2023-04-26 ENCOUNTER — Emergency Department (HOSPITAL_BASED_OUTPATIENT_CLINIC_OR_DEPARTMENT_OTHER)
Admission: EM | Admit: 2023-04-26 | Discharge: 2023-04-26 | Disposition: A | Discharge status: Home / Self Care | Payer: 59 | Attending: Emergency Medicine | Admitting: Emergency Medicine

## 2023-04-26 ENCOUNTER — Encounter (HOSPITAL_BASED_OUTPATIENT_CLINIC_OR_DEPARTMENT_OTHER): Payer: Self-pay | Admitting: Urology

## 2023-04-26 ENCOUNTER — Emergency Department (HOSPITAL_BASED_OUTPATIENT_CLINIC_OR_DEPARTMENT_OTHER): Payer: 59

## 2023-04-26 DIAGNOSIS — Z79899 Other long term (current) drug therapy: Secondary | ICD-10-CM | POA: Insufficient documentation

## 2023-04-26 DIAGNOSIS — K5900 Constipation, unspecified: Secondary | ICD-10-CM | POA: Diagnosis present

## 2023-04-26 DIAGNOSIS — I10 Essential (primary) hypertension: Secondary | ICD-10-CM | POA: Diagnosis not present

## 2023-04-26 MED ORDER — FLEET ENEMA 7-19 GM/118ML RE ENEM: 1.0000 | ENEMA | Freq: Once | RECTAL | Status: DC

## 2023-04-26 NOTE — ED Provider Notes (Signed)
EMERGENCY DEPARTMENT AT [facility] Provider Note   CSN: [id number] Arrival date & time: 04/26/23  1857     History  Chief Complaint  Patient presents with   Constipation    Sarah Randall is a 57 y.o. female.  Patient is here with constipation.  History of the same.  She has been taking stool softeners, MiraLAX.  She is on chronic narcotics.  She denies any nausea or vomiting.  She is passing gas.  She is having to strain.  She denies any abdominal pain.  She feels like there is a stool ball in her rectum.  She denies any chest pain or shortness of breath or fever or chills.  The history is provided by the patient.       Home Medications Prior to Admission medications   Medication Sig Start Date End Date Taking? Authorizing Provider  Cyanocobalamin (B-12) 5000 MCG CAPS Take 5,000 mcg by mouth every other day.    [provider]  diclofenac Sodium (VOLTAREN) 1 % GEL Apply 2 g topically 4 (four) times daily. 01/08/22   Marcine Matar, MD  esomeprazole (NEXIUM) 40 MG capsule TAKE 1 CAPSULE BY MOUTH EVERY DAY 05/27/22   Marcine Matar, MD  Evolocumab (REPATHA SURECLICK) 140 MG/ML SOAJ Inject 1 Dose into the skin every 14 (fourteen) days. 07/17/22   Hilty, Lisette Abu, MD  ezetimibe (ZETIA) 10 MG tablet Take 10 mg by mouth every other day. 05/15/22   [provider]  hydrochlorothiazide (HYDRODIURIL) 25 MG tablet TAKE 1 TABLET BY MOUTH EVERY DAY 07/01/22   Marcine Matar, MD  HYDROcodone-acetaminophen (NORCO) 5-325 MG tablet Take 1 tablet by mouth every 6 (six) hours as needed. 08/19/22   Tarry Kos, MD  ipratropium (ATROVENT) 0.03 % nasal spray Place 2 sprays into both nostrils every 12 (twelve) hours. 08/15/22   Pricilla Loveless, MD  lidocaine (LIDODERM) 5 % Place 1 patch onto the skin daily. Remove & Discard patch within 12 hours or as directed by MD 03/08/22   Marita Kansas, PA-C  loratadine (CLARITIN) 10 MG tablet TAKE 1 TABLET BY MOUTH EVERY  DAY AS NEEDED FOR ALLERGY 08/14/21   Marcine Matar, MD  losartan (COZAAR) 100 MG tablet TAKE 1 TABLET BY MOUTH EVERY DAY 07/02/22   Marcine Matar, MD  nitroGLYCERIN (NITROSTAT) 0.4 MG SL tablet Place 1 tablet (0.4 mg total) under the tongue every 5 (five) minutes as needed. 10/21/18 07/17/22  Revankar, Aundra Dubin, MD  oxyCODONE-acetaminophen (PERCOCET) 10-325 MG tablet Take 1 tablet by mouth every 6 (six) hours. 07/13/22   [provider]  predniSONE (STERAPRED UNI-PAK 21 TAB) 10 MG (21) TBPK tablet Take as directed 10/01/22   Cristie Hem, PA-C  pregabalin (LYRICA) 75 MG capsule Take 75 mg by mouth 2 (two) times daily. 06/12/22   [provider]  propranolol (INDERAL) 10 MG tablet TAKE 1 TABLET BY MOUTH TWICE A DAY 07/01/22   Marcine Matar, MD      Allergies    Pregabalin, Cymbalta [duloxetine hcl], Escitalopram, Gabapentin, Lipitor [atorvastatin], Pravachol [pravastatin], Repatha [evolocumab], and Zoloft [sertraline hcl]    Review of Systems   Review of Systems  Physical Exam Updated Vital Signs BP 115/81 (BP Location: Left Arm)   Pulse (!) 109   Temp 98.2 F (36.8 C)   Resp 20   Ht 5\' 4"  (1.626 m)   Wt 126.6 kg   LMP 10/10/2019   SpO2 98%  BMI 47.89 kg/m  Physical Exam Vitals and nursing note reviewed. Exam conducted with a chaperone present.  Constitutional:      General: She is not in acute distress.    Appearance: She is well-developed.  HENT:     Head: Normocephalic and atraumatic.  Eyes:     Extraocular Movements: Extraocular movements intact.     Conjunctiva/sclera: Conjunctivae normal.     Pupils: Pupils are equal, round, and reactive to light.  Cardiovascular:     Rate and Rhythm: Normal rate and regular rhythm.     Heart sounds: No murmur heard. Pulmonary:     Effort: Pulmonary effort is normal. No respiratory distress.     Breath sounds: Normal breath sounds.  Abdominal:     General: Abdomen is flat.     Palpations: Abdomen is  soft.     Tenderness: There is no abdominal tenderness.  Genitourinary:    Comments: Stool ball in her rectum. Musculoskeletal:        General: No swelling.     Cervical back: Neck supple.  Skin:    General: Skin is warm and dry.     Capillary Refill: Capillary refill takes less than 2 seconds.  Neurological:     Mental Status: She is alert.  Psychiatric:        Mood and Affect: Mood normal.     ED Results / Procedures / Treatments   Labs (all labs ordered are listed, but only abnormal results are displayed) Labs Reviewed - No data to display  EKG None  Radiology DG Abdomen 1 View  Result Date: 04/26/2023 CLINICAL DATA:  Constipation, unspecified abdominal pain EXAM: ABDOMEN - 1 VIEW COMPARISON:  CT 04/08/2023 FINDINGS: The bowel gas pattern is normal. Moderate stool ball within the rectal vault. No radio-opaque calculi or other significant radiographic abnormality are seen. L4-5 lumbar fusion with instrumentation and left total hip arthroplasty has been performed. IMPRESSION: 1. Moderate stool ball within the rectal vault. Nonobstructive bowel gas pattern. Electronically Signed   By: Helyn Numbers M.D.   On: 04/26/2023 19:49    Procedures Fecal disimpaction  Date/Time: 04/26/2023 9:04 PM  Performed by: Virgina Norfolk, DO Authorized by: Virgina Norfolk, DO  Consent: Verbal consent obtained. Risks and benefits: risks, benefits and alternatives were discussed Consent given by: patient Patient understanding: patient states understanding of the procedure being performed Patient consent: the patient's understanding of the procedure matches consent given Imaging studies: imaging studies available Required items: required blood products, implants, devices, and special equipment available Patient identity confirmed: verbally with patient Time out: Immediately prior to procedure a "time out" was called to verify the correct patient, procedure, equipment, support staff and site/side  marked as required. Preparation: Patient was prepped and draped in the usual sterile fashion. Local anesthesia used: no  Anesthesia: Local anesthesia used: no  Sedation: Patient sedated: no  Patient tolerance: patient tolerated the procedure well with no immediate complications       Medications Ordered in ED Medications  sodium phosphate (FLEET) 7-19 GM/118ML enema 1 enema (has no administration in time range)    ED Course/ Medical Decision Making/ A&P                             Medical Decision Making Amount and/or Complexity of Data Reviewed Radiology: ordered.  Risk OTC drugs.   Sarah Randall is here constipation.  History of hypertension, chronic pain on chronic narcotics.  She  feels like she has a stool ball in her rectum that she cannot get out.  She denies any fever or chills.  No abdominal pain.  She has been using stool softeners and MiraLAX.  KUB concerns likely stool impaction.  She was agreeable to manual disimpaction which was very successful.  Was able to remove stool and patient had a good bowel movement afterwards.  Will have her continue MiraLAX at home.  I have no concern for bowel obstruction or other acute abdominal process at this time.  Discharged in good condition.  Understands return precautions.  This chart was dictated using voice recognition software.  Despite best efforts to proofread,  errors can occur which can change the documentation meaning.         Final Clinical Impression(s) / ED Diagnoses Final diagnoses:  Constipation, unspecified constipation type    Rx / DC Orders ED Discharge Orders     None         Virgina Norfolk, DO 04/26/23 2105

## 2023-04-26 NOTE — ED Notes (Signed)
Pt is here for constipation, pt states she is on several meds that cause constipation and is on a regimen, this time per pt it's not working.  Pt has tried, miralax, senna, and milk of mag with no relief.

## 2023-04-26 NOTE — ED Triage Notes (Signed)
Pt states is constipated  Last BM 4-5 days ago  Took stool softener, miralax and milk of mag with no relief  Pressure at rectum

## 2023-04-26 NOTE — ED Notes (Signed)
EDP at bedside to disimpact pt.  Small amt of soft stool removed, pt now in Bathroom trying

## 2023-06-28 ENCOUNTER — Emergency Department (HOSPITAL_BASED_OUTPATIENT_CLINIC_OR_DEPARTMENT_OTHER): Payer: 59

## 2023-06-28 ENCOUNTER — Emergency Department (HOSPITAL_BASED_OUTPATIENT_CLINIC_OR_DEPARTMENT_OTHER)
Admission: EM | Admit: 2023-06-28 | Discharge: 2023-06-28 | Disposition: A | Discharge status: Home / Self Care | Payer: 59 | Attending: Emergency Medicine | Admitting: Emergency Medicine

## 2023-06-28 ENCOUNTER — Other Ambulatory Visit: Payer: Self-pay

## 2023-06-28 DIAGNOSIS — E119 Type 2 diabetes mellitus without complications: Secondary | ICD-10-CM | POA: Diagnosis not present

## 2023-06-28 DIAGNOSIS — R0781 Pleurodynia: Secondary | ICD-10-CM | POA: Insufficient documentation

## 2023-06-28 DIAGNOSIS — I10 Essential (primary) hypertension: Secondary | ICD-10-CM | POA: Diagnosis not present

## 2023-06-28 LAB — URINALYSIS, ROUTINE W REFLEX MICROSCOPIC
Bilirubin Urine: NEGATIVE
Glucose, UA: >=500 mg/dL — AB
Ketones, ur: NEGATIVE mg/dL
Leukocytes,Ua: NEGATIVE
Nitrite: NEGATIVE
Protein, ur: NEGATIVE mg/dL
Specific Gravity, Urine: 1.02 (ref 1.005–1.030)
pH: 6 (ref 5.0–8.0)

## 2023-06-28 LAB — CBC WITH DIFFERENTIAL/PLATELET
Abs Immature Granulocytes: 0.04 10*3/uL (ref 0.00–0.07)
Basophils Absolute: 0.1 10*3/uL (ref 0.0–0.1)
Basophils Relative: 1 %
Eosinophils Absolute: 0.3 10*3/uL (ref 0.0–0.5)
Eosinophils Relative: 2 %
HCT: 45.3 % (ref 36.0–46.0)
Hemoglobin: 15.2 g/dL — ABNORMAL HIGH (ref 12.0–15.0)
Immature Granulocytes: 0 %
Lymphocytes Relative: 32 %
Lymphs Abs: 3.7 10*3/uL (ref 0.7–4.0)
MCH: 30.8 pg (ref 26.0–34.0)
MCHC: 33.6 g/dL (ref 30.0–36.0)
MCV: 91.9 fL (ref 80.0–100.0)
Monocytes Absolute: 0.7 10*3/uL (ref 0.1–1.0)
Monocytes Relative: 6 %
Neutro Abs: 6.7 10*3/uL (ref 1.7–7.7)
Neutrophils Relative %: 59 %
Platelets: 313 10*3/uL (ref 150–400)
RBC: 4.93 MIL/uL (ref 3.87–5.11)
RDW: 13.5 % (ref 11.5–15.5)
WBC: 11.6 10*3/uL — ABNORMAL HIGH (ref 4.0–10.5)
nRBC: 0 % (ref 0.0–0.2)

## 2023-06-28 LAB — COMPREHENSIVE METABOLIC PANEL
ALT: 15 U/L (ref 0–44)
AST: 18 U/L (ref 15–41)
Albumin: 4 g/dL (ref 3.5–5.0)
Alkaline Phosphatase: 83 U/L (ref 38–126)
Anion gap: 8 (ref 5–15)
BUN: 13 mg/dL (ref 6–20)
CO2: 27 mmol/L (ref 22–32)
Calcium: 8.7 mg/dL — ABNORMAL LOW (ref 8.9–10.3)
Chloride: 96 mmol/L — ABNORMAL LOW (ref 98–111)
Creatinine, Ser: 1.05 mg/dL — ABNORMAL HIGH (ref 0.44–1.00)
GFR, Estimated: >60 mL/min (ref >60)
Glucose, Bld: 88 mg/dL (ref 70–99)
Potassium: 3.8 mmol/L (ref 3.5–5.1)
Sodium: 131 mmol/L — ABNORMAL LOW (ref 135–145)
Total Bilirubin: 0.8 mg/dL (ref 0.3–1.2)
Total Protein: 7.8 g/dL (ref 6.5–8.1)

## 2023-06-28 LAB — URINALYSIS, MICROSCOPIC (REFLEX)

## 2023-06-28 LAB — LIPASE, BLOOD: Lipase: 35 U/L (ref 11–51)

## 2023-06-28 MED ORDER — LIDOCAINE 5 % EX PTCH
1.0000 | MEDICATED_PATCH | CUTANEOUS | Status: DC
  Administered 2023-06-28: 1 via TRANSDERMAL
  Filled 2023-06-28: qty 1

## 2023-06-28 MED ORDER — ACETAMINOPHEN 325 MG PO TABS
650.0000 mg | ORAL_TABLET | Freq: Once | ORAL | Status: AC
  Administered 2023-06-28: 650 mg via ORAL
  Filled 2023-06-28: qty 2

## 2023-06-28 MED ORDER — LIDOCAINE 5 % EX PTCH: 1.0000 | MEDICATED_PATCH | CUTANEOUS | 0 refills | Status: DC

## 2023-06-28 NOTE — ED Provider Notes (Signed)
[place] EMERGENCY DEPARTMENT AT [facility] Provider Note   CSN: [id number] Arrival date & time: 06/28/23  1144     History  Chief Complaint  Patient presents with   Right Side Pain    Sarah Randall is a 57 y.o. female history of hypertension, obesity, type 2 diabetes presenting with right rib pain for the past 3 years.  Patient states she has had 3 episodes of right rib pain in the past 3 years that comes and goes without obvious trigger.  Patient states that the symptoms been going on for the past 2 to 3 days patient is unsure what makes the pain better besides standing still.  Patient states that movement makes the pain worse.  Patient denies fevers, abdominal pain, chest pain, shortness of breath.  Patient denies any physical activity that may cause this pain.  States she still has her gallbladder and denies any recent abdominal surgery.  Patient states this is the same pain that she always gets over the past 3 years and states that usually resolves after some time.  Patient dates that she has had CTs in the past that have shown nothing.  Patient denies nausea/vomiting, abdominal pain, change in sensation/motor skills  Home Medications Prior to Admission medications   Medication Sig Start Date End Date Taking? Authorizing Provider  lidocaine (LIDODERM) 5 % Place 1 patch onto the skin daily. Remove & Discard patch within 12 hours or as directed by MD 06/28/23  Yes Kasidee Voisin, Beverly Gust, PA-C  Cyanocobalamin (B-12) 5000 MCG CAPS Take 5,000 mcg by mouth every other day.    [provider]  diclofenac Sodium (VOLTAREN) 1 % GEL Apply 2 g topically 4 (four) times daily. 01/08/22   Marcine Matar, MD  esomeprazole (NEXIUM) 40 MG capsule TAKE 1 CAPSULE BY MOUTH EVERY DAY 05/27/22   Marcine Matar, MD  Evolocumab (REPATHA SURECLICK) 140 MG/ML SOAJ Inject 1 Dose into the skin every 14 (fourteen) days. 07/17/22   Hilty, Lisette Abu, MD  ezetimibe (ZETIA) 10 MG tablet Take 10  mg by mouth every other day. 05/15/22   [provider]  hydrochlorothiazide (HYDRODIURIL) 25 MG tablet TAKE 1 TABLET BY MOUTH EVERY DAY 07/01/22   Marcine Matar, MD  HYDROcodone-acetaminophen (NORCO) 5-325 MG tablet Take 1 tablet by mouth every 6 (six) hours as needed. 08/19/22   Tarry Kos, MD  ipratropium (ATROVENT) 0.03 % nasal spray Place 2 sprays into both nostrils every 12 (twelve) hours. 08/15/22   Pricilla Loveless, MD  loratadine (CLARITIN) 10 MG tablet TAKE 1 TABLET BY MOUTH EVERY DAY AS NEEDED FOR ALLERGY 08/14/21   Marcine Matar, MD  losartan (COZAAR) 100 MG tablet TAKE 1 TABLET BY MOUTH EVERY DAY 07/02/22   Marcine Matar, MD  nitroGLYCERIN (NITROSTAT) 0.4 MG SL tablet Place 1 tablet (0.4 mg total) under the tongue every 5 (five) minutes as needed. 10/21/18 07/17/22  Revankar, Aundra Dubin, MD  oxyCODONE-acetaminophen (PERCOCET) 10-325 MG tablet Take 1 tablet by mouth every 6 (six) hours. 07/13/22   [provider]  predniSONE (STERAPRED UNI-PAK 21 TAB) 10 MG (21) TBPK tablet Take as directed 10/01/22   Cristie Hem, PA-C  pregabalin (LYRICA) 75 MG capsule Take 75 mg by mouth 2 (two) times daily. 06/12/22   [provider]  propranolol (INDERAL) 10 MG tablet TAKE 1 TABLET BY MOUTH TWICE A DAY 07/01/22   Marcine Matar, MD      Allergies  Pregabalin, Cymbalta [duloxetine hcl], Escitalopram, Gabapentin, Lipitor [atorvastatin], Pravachol [pravastatin], Repatha [evolocumab], and Zoloft [sertraline hcl]    Review of Systems   Review of Systems  Physical Exam Updated Vital Signs BP 110/73   Pulse 80   Temp 97.6 F (36.4 C) (Oral)   Resp 17   Ht 5\' 4"  (1.626 m)   Wt 122.5 kg   LMP 10/10/2019   SpO2 97%   BMI 46.35 kg/m  Physical Exam Vitals reviewed.  Constitutional:      General: She is not in acute distress. HENT:     Head: Normocephalic and atraumatic.  Eyes:     Extraocular Movements: Extraocular movements intact.      Conjunctiva/sclera: Conjunctivae normal.     Pupils: Pupils are equal, round, and reactive to light.  Cardiovascular:     Rate and Rhythm: Normal rate and regular rhythm.     Pulses: Normal pulses.     Heart sounds: Normal heart sounds.     Comments: 2+ bilateral radial/dorsalis pedis pulses with regular rate Pulmonary:     Effort: Pulmonary effort is normal. No respiratory distress.     Breath sounds: Normal breath sounds.  Abdominal:     Palpations: Abdomen is soft.     Tenderness: There is no abdominal tenderness. There is no guarding or rebound.  Musculoskeletal:        General: Normal range of motion.     Cervical back: Normal range of motion and neck supple.     Comments: 5 out of 5 bilateral grip/leg extension strength No right rib tenderness when palpating; no step-off/crepitus/abnormalities palpated  Skin:    General: Skin is warm and dry.     Capillary Refill: Capillary refill takes less than 2 seconds.     Findings: No rash.  Neurological:     General: No focal deficit present.     Mental Status: She is alert and oriented to person, place, and time.     Comments: Sensation intact in all 4 limbs  Psychiatric:        Mood and Affect: Mood normal.     ED Results / Procedures / Treatments   Labs (all labs ordered are listed, but only abnormal results are displayed) Labs Reviewed  CBC WITH DIFFERENTIAL/PLATELET - Abnormal; Notable for the following components:      Result Value   WBC 11.6 (*)    Hemoglobin 15.2 (*)    All other components within normal limits  COMPREHENSIVE METABOLIC PANEL - Abnormal; Notable for the following components:   Sodium 131 (*)    Chloride 96 (*)    Creatinine, Ser 1.05 (*)    Calcium 8.7 (*)    All other components within normal limits  URINALYSIS, ROUTINE W REFLEX MICROSCOPIC - Abnormal; Notable for the following components:   Glucose, UA >=500 (*)    Hgb urine dipstick TRACE (*)    All other components within normal limits   URINALYSIS, MICROSCOPIC (REFLEX) - Abnormal; Notable for the following components:   Bacteria, UA RARE (*)    All other components within normal limits  LIPASE, BLOOD    EKG None  Radiology US Abdomen Limited RUQ (LIVER/GB)  Result Date: 06/28/2023 CLINICAL DATA:  Right upper quadrant pain EXAM: ULTRASOUND ABDOMEN LIMITED RIGHT UPPER QUADRANT COMPARISON:  Two thousand seventeen ultrasound.  CT 04/08/23 FINDINGS: Gallbladder: No gallstones or wall thickening visualized. No sonographic Murphy sign noted by sonographer. Common bile duct: Poorly seen with overlapping bowel gas and soft tissue. No  secondary changes of ductal dilatation. Liver: Mild diffuse hepatic increased echogenicity consistent with fatty infiltration. Portal vein is patent on color Doppler imaging with normal direction of blood flow towards the liver. Other: None. IMPRESSION: Mild fatty liver infiltration. No secondary changes of biliary ductal dilatation. The common duct is obscured by overlapping bowel gas and soft tissue. No gallstones. Electronically Signed   By: Karen Kays M.D.   On: 06/28/2023 17:00   DG Ribs Unilateral W/Chest Right  Result Date: 06/28/2023 CLINICAL DATA:  Right-sided rib pain. EXAM: RIGHT RIBS AND CHEST - 3 VIEW COMPARISON:  Chest x-ray 01/12/2022. FINDINGS: No consolidation, pneumothorax or effusion. Normal cardiopericardial silhouette without edema. Degenerative changes seen along the spine. No rib fracture clearly seen on the right. IMPRESSION: No rib fracture identified.  No effusion or pneumothorax Electronically Signed   By: Karen Kays M.D.   On: 06/28/2023 15:35    Procedures Procedures    Medications Ordered in ED Medications  lidocaine (LIDODERM) 5 % 1 patch (has no administration in time range)  acetaminophen (TYLENOL) tablet 650 mg (650 mg Oral Given 06/28/23 1655)    ED Course/ Medical Decision Making/ A&P                             Medical Decision Making  Jamira Barfuss Sanders 57  y.o. presented today for right rib pain. Working DDx that I considered at this time includes, but not limited to, MSK, fracture, cholecystitis, cholelithiasis hepatitis, gastritis, pancreatitis, zoster.  R/o DDx:  fracture, cholecystitis, cholelithiasis hepatitis, gastritis, pancreatitis, zoster: These are considered less likely due to history of present illness and physical exam findings  Review of prior external notes: 04/26/2023 ED provider  Unique Tests and My Interpretation:  Right rib X-ray: No acute changes Right upper quadrant ultrasound: No acute changes CBC: Unremarkable Lipase: Unremarkable CMP: Unremarkable UA: Elevated glucose  Discussion with Independent Historian: None  Discussion of Management of Tests: None  Risk: Medium: prescription drug management  Risk Stratification Score: none  Plan: On exam patient was in no acute distress stable vitals.  Patient's exam was largely unremarkable.  Patient states that she gets this pain periodically over the past 3 years but is unsure what causes it or makes it better.  Patient's physical exam did not show any tenderness on her ribs or right upper quadrant however patient states she still has her gallbladder.  Labs will be drawn along with right upper quadrant ultrasound.  Patient stable this time.  Patient's labs and ultrasound came back reassuring.  On recheck patient's abdominal exam is still reassuring with no tenderness along with palpating her ribs there was not any tenderness.  At this time patient's pain may be musculoskeletal in nature as this is the third time in 2 years this occurs and eventually resolves according to the patient.  Patient that she has had previous CTs in the past that showed nothing in the past and at this time we agreed to forego CT scan at this time.  At this time are low suspicion of any life-threatening diagnosis and patient will be discharged with lidocaine patches with primary care  follow-up.  Patient was given return precautions. Patient stable for discharge at this time.  Patient verbalized understanding of plan.         Final Clinical Impression(s) / ED Diagnoses Final diagnoses:  Rib pain on right side    Rx / DC Orders ED Discharge Orders  Ordered    lidocaine (LIDODERM) 5 %  Every 24 hours        06/28/23 1719              Remi Deter 06/28/23 1722    Laurence Spates, MD 06/29/23 [phone]

## 2023-06-28 NOTE — ED Notes (Signed)
Attempted IV twice. Patient reports having ultrasound guided previously.

## 2023-06-28 NOTE — ED Notes (Signed)
Discharge paperwork reviewed entirely with patient, including follow up care. Pain was under control. The patient received instruction and coaching on their prescriptions, and all follow-up questions were answered.  Pt verbalized understanding as well as all parties involved. No questions or concerns voiced at the time of discharge. No acute distress noted.   Pt ambulated out to PVA without incident or assistance.  

## 2023-06-28 NOTE — ED Triage Notes (Signed)
Patient presents to ED via POV from home. Here with right side pain. When asked where patient points to her ribs. Denies fall/trauma/injury. Denies nausea, vomiting, diarrhea. Patient reports she has been seen x3 for same. Ambulatory. Well appearing.

## 2023-06-28 NOTE — Discharge Instructions (Signed)
Please follow-up with your primary care provider regarding recent symptoms and ER visit.  Today your labs and ultrasound were all reassuring and your pain may be muscle skeletal in nature.  You are given a lidocaine patch for topical relief however you may continue take your pain meds at home and use Tylenol every 6 hours as needed.  If symptoms change or worsen please return to ER.

## 2023-07-15 ENCOUNTER — Other Ambulatory Visit: Payer: Self-pay

## 2023-07-15 ENCOUNTER — Encounter (HOSPITAL_BASED_OUTPATIENT_CLINIC_OR_DEPARTMENT_OTHER): Payer: Self-pay | Admitting: Emergency Medicine

## 2023-07-15 ENCOUNTER — Emergency Department (HOSPITAL_BASED_OUTPATIENT_CLINIC_OR_DEPARTMENT_OTHER)
Admission: EM | Admit: 2023-07-15 | Discharge: 2023-07-15 | Disposition: A | Discharge status: Home / Self Care | Payer: 59 | Attending: Emergency Medicine | Admitting: Emergency Medicine

## 2023-07-15 DIAGNOSIS — E119 Type 2 diabetes mellitus without complications: Secondary | ICD-10-CM | POA: Insufficient documentation

## 2023-07-15 DIAGNOSIS — K5900 Constipation, unspecified: Secondary | ICD-10-CM | POA: Insufficient documentation

## 2023-07-15 DIAGNOSIS — Z7984 Long term (current) use of oral hypoglycemic drugs: Secondary | ICD-10-CM | POA: Diagnosis not present

## 2023-07-15 DIAGNOSIS — I10 Essential (primary) hypertension: Secondary | ICD-10-CM | POA: Insufficient documentation

## 2023-07-15 MED ORDER — FLEET ENEMA 7-19 GM/118ML RE ENEM
1.0000 | ENEMA | Freq: Once | RECTAL | Status: AC
  Administered 2023-07-15: 1 via RECTAL
  Filled 2023-07-15: qty 1

## 2023-07-15 MED ORDER — LACTULOSE 10 GM/15ML PO SOLN: 20.0000 g | Freq: Every day | ORAL | 0 refills | Status: DC | PRN

## 2023-07-15 NOTE — ED Triage Notes (Signed)
Patient arrived via POV c/o constipation x 4 days. Patient states taking multiple OTC medications with no relief. Patient states very small bowel movements. Patient is AO x 4, VS WDL, normal gait.

## 2023-07-15 NOTE — Discharge Instructions (Signed)
He was seen in the emergency room at the with constipation.  We have been able to relieve the symptoms but I have prescribed an additional option for constipation, lactulose.  You may take this as directed and follow with your primary care doctor in the coming week.

## 2023-07-15 NOTE — ED Notes (Signed)
Pt had bowel movement, states "she feels much better"

## 2023-07-15 NOTE — ED Provider Notes (Signed)
Emergency Department Provider Note   I have reviewed the triage vital signs and the nursing notes.   HISTORY  Chief Complaint Constipation   HPI Sarah Randall is a 57 y.o. female with past history reviewed below including chronic pain medication and diabetes on Mounjaro presents to the emergency department with constipation.  This has been a recurrent issue for her.  She is on chronic medication for constipation but has been taking this with no relief.  She denies abdominal pain.  She feels some rectal fullness and actually even put on a glove and tried to remove some stool manually but was unsuccessful.  She has required disimpaction in the past and states this feels similar. No vomiting.   Past Medical History:  Diagnosis Date   Anemia    Anxiety    Arthritis    B12 deficiency 06/30/2019   Depression    GERD (gastroesophageal reflux disease)    occasionally takes OTC   History of leukocytosis    Hyperlipidemia    Hypertension    Low back pain    Obesity    Pain in right buttock    Goes down to Right leg to lateral aspect of calf.   Pre-diabetes    Sleep apnea    tested 2014 - unable to tolerate machine   Spondylolisthesis at L4-L5 level     Review of Systems  Constitutional: No fever/chills Cardiovascular: Denies chest pain. Respiratory: Denies shortness of breath. Gastrointestinal: No abdominal pain.  No nausea, no vomiting.  No diarrhea. Positive constipation. Genitourinary: Negative for dysuria. Musculoskeletal: Negative for back pain. Skin: Negative for rash. Neurological: Negative for headaches.   ____________________________________________   PHYSICAL EXAM:  VITAL SIGNS: ED Triage Vitals  Encounter Vitals Group     BP 07/15/23 2114 (!) 126/96     Pulse Rate 07/15/23 2114 (!) 107     Resp 07/15/23 2114 18     Temp 07/15/23 2114 98.3 F (36.8 C)     Temp Source 07/15/23 2114 Oral     SpO2 07/15/23 2114 97 %     Weight 07/15/23 2114 263 lb  (119.3 kg)     Height 07/15/23 2114 5\' 4"  (1.626 m)   Constitutional: Alert and oriented. Well appearing and in no acute distress. Eyes: Conjunctivae are normal.  Head: Atraumatic. Nose: No congestion/rhinnorhea. Mouth/Throat: Mucous membranes are moist.  Neck: No stridor.   Cardiovascular: Normal rate, regular rhythm. Good peripheral circulation. Grossly normal heart sounds.   Respiratory: Normal respiratory effort.  No retractions. Lungs CTAB. Gastrointestinal: Soft and nontender. No distention. Rectal exam performed with nurse tech chaperone and after obtaining patient's verbal consent.  No palpable stool ball.  Musculoskeletal: No lower extremity tenderness nor edema. No gross deformities of extremities. Neurologic:  Normal speech and language. No gross focal neurologic deficits are appreciated.  Skin:  Skin is warm, dry and intact. No rash noted.  ____________________________________________   PROCEDURES  Procedure(s) performed:   Procedures  None  ____________________________________________   INITIAL IMPRESSION / ASSESSMENT AND PLAN / ED COURSE  Pertinent labs & imaging results that were available during my care of the patient were reviewed by me and considered in my medical decision making (see chart for details).   This patient is Presenting for Evaluation of rectal pain, which does require a range of treatment options, and is a complaint that involves a high risk of morbidity and mortality.  The Differential Diagnoses include fecal impaction, constipation, proctitis, diverticulitis, etc.  Critical Interventions-  Medications  sodium phosphate (FLEET) 7-19 GM/118ML enema 1 enema (1 enema Rectal Given by Other 07/15/23 2209)    Reassessment after intervention:  symptoms improved.   I decided to review pertinent External Data, and in summary patient with ED visit and fecal impaction in May 2024.   Radiologic Tests: Consider abdominal imaging but no focal  tenderness on exam.    Social Determinants of Health Risk patient is a non-smoker.   Medical Decision Making: Summary:  Patient presents to the emergency department for evaluation of rectal fullness and constipation.  Clinically suspect fecal impaction.  Plan to move forward with manual disimpaction as she has required in the past.  Exceedingly low suspicion for bowel obstruction or other acute process in the abdomen.  Defer abdominal imaging for now.  Reevaluation with update and discussion with patient with good relief from enema in the ED and multiple large BMs. Stable for discharge.   Patient's presentation is most consistent with exacerbation of chronic illness.   Disposition: discharge  ____________________________________________  FINAL CLINICAL IMPRESSION(S) / ED DIAGNOSES  Final diagnoses:  Constipation, unspecified constipation type     NEW OUTPATIENT MEDICATIONS STARTED DURING THIS VISIT:  Discharge Medication List as of 07/15/2023 10:52 PM     START taking these medications   Details  lactulose (CHRONULAC) 10 GM/15ML solution Take 30 mLs (20 g total) by mouth daily as needed for mild constipation or moderate constipation., Starting Thu 07/15/2023, Normal        Note:  This document was prepared using Dragon voice recognition software and may include unintentional dictation errors.  Alona Bene, MD, [facility]    , Arlyss Repress, MD 07/16/23 [id number]

## 2023-09-09 ENCOUNTER — Ambulatory Visit: Payer: 59 | Admitting: Orthopaedic Surgery

## 2023-09-09 ENCOUNTER — Other Ambulatory Visit (INDEPENDENT_AMBULATORY_CARE_PROVIDER_SITE_OTHER): Payer: 59

## 2023-09-09 DIAGNOSIS — M25531 Pain in right wrist: Secondary | ICD-10-CM | POA: Diagnosis not present

## 2023-09-09 DIAGNOSIS — G8929 Other chronic pain: Secondary | ICD-10-CM

## 2023-09-09 DIAGNOSIS — M79644 Pain in right finger(s): Secondary | ICD-10-CM | POA: Diagnosis not present

## 2023-09-09 NOTE — Progress Notes (Signed)
Office Visit Note   Patient: Sarah Randall           Date of Birth: May 30, 1966           MRN: [id number] Visit Date: 09/09/2023              Requested by: Felix Pacini, FNP [address] [place],  [place] [address] PCP: Felix Pacini, FNP   Assessment & Plan: Visit Diagnoses:  1. Pain in right wrist   2. Chronic pain of right thumb     Plan: Sarah Randall is a 57 year old female with right thumb CMC arthritis.  Symptoms are still manageable and the DJD is not too severe.  She will just use Voltaren gel and a CMC brace for now.  I will see her back as needed.  Follow-Up Instructions: No follow-ups on file.   Orders:  Orders Placed This Encounter  Procedures   XR Hand Complete Right   No orders of the defined types were placed in this encounter.     Procedures: No procedures performed   Clinical Data: No additional findings.   Subjective: Chief Complaint  Patient presents with   Right Wrist - Pain   Right Thumb - Pain    HPI Sarah Randall is a 57 year old female who is well-known to me comes in for chronic right wrist and base of thumb pain that is worse over the last month.  She has constant ache with sharp pain at times that occasionally wakes her up at night.  Has trouble opening jars and grasping.  Review of Systems  Constitutional: Negative.   HENT: Negative.    Eyes: Negative.   Respiratory: Negative.    Cardiovascular: Negative.   Endocrine: Negative.   Musculoskeletal: Negative.   Neurological: Negative.   Hematological: Negative.   Psychiatric/Behavioral: Negative.    All other systems reviewed and are negative.    Objective: Vital Signs: LMP 10/10/2019   Physical Exam Vitals and nursing note reviewed.  Constitutional:      Appearance: She is well-developed.  HENT:     Head: Normocephalic and atraumatic.  Pulmonary:     Effort: Pulmonary effort is normal.  Abdominal:     Palpations: Abdomen is soft.  Musculoskeletal:     Cervical  back: Neck supple.  Skin:    General: Skin is warm.     Capillary Refill: Capillary refill takes less than 2 seconds.  Neurological:     Mental Status: She is alert and oriented to person, place, and time.  Psychiatric:        Behavior: Behavior normal.        Thought Content: Thought content normal.        Judgment: Judgment normal.     Ortho Exam Exam of the right thumb shows pain with CMC grind test.  No triggering.  Range of motion is preserved. Specialty Comments:  No specialty comments available.  Imaging: No results found.   PMFS History: Patient Active Problem List   Diagnosis Date Noted   Pigmented villonodular synovitis of left knee 08/19/2022   Acute medial meniscus tear of left knee 07/21/2022   Recurrent sinusitis 01/27/2022   Trigger index finger of right hand 01/27/2022   Acute otitis media 12/23/2021   Multiple joint pain 02/10/2021   Positive ANA (antinuclear antibody) 12/13/2020   Trigger finger of left thumb 12/10/2020   Lumbar radiculopathy 11/07/2020   Chronic right-sided low back pain with right-sided sciatica 10/10/2020   Sleep disturbance 10/10/2020  Myalgia 10/10/2020   Super obese 10/10/2020   Abnormality of gait 10/10/2020   Statin intolerance 09/17/2020   Recurrent boils 09/17/2020   Chronic bilateral low back pain 05/16/2020   Postoperative state 12/27/2019   Iron deficiency 08/03/2019   B12 deficiency 06/30/2019   Chronic right shoulder pain 07/05/2018   Trochanteric bursitis of left hip 07/05/2018   Morbid obesity (HCC) 07/05/2018   Body mass index 45.0-49.9, adult (HCC) 07/05/2018   Marijuana user 05/26/2018   Status post total right knee replacement 01/12/2018   Status post total hip replacement, left 09/22/2017   Hyperlipidemia 08/10/2017   Mild depression 06/25/2017   Essential hypertension 06/25/2017   Tobacco abuse 06/25/2017   Prediabetes 06/25/2017   OSA on CPAP 06/25/2017   Bronchitis 09/24/2016   Leukocytosis  09/24/2016   Radiculopathy, lumbar region 07/16/2016   Degenerative lumbar disc 07/03/2016   Synovitis of hip 04/20/2016   Screening for breast cancer 12/28/2014   Type 2 diabetes mellitus with obesity (HCC) 04/06/2014   Pain in joint involving lower leg 05/13/2013   Past Medical History:  Diagnosis Date   Anemia    Anxiety    Arthritis    B12 deficiency 06/30/2019   Depression    GERD (gastroesophageal reflux disease)    occasionally takes OTC   History of leukocytosis    Hyperlipidemia    Hypertension    Low back pain    Obesity    Pain in right buttock    Goes down to Right leg to lateral aspect of calf.   Pre-diabetes    Sleep apnea    tested 2014 - unable to tolerate machine   Spondylolisthesis at L4-L5 level     Family History  Problem Relation Age of Onset   Hypertension Mother    Diabetes Mother    Breast cancer Mother 58   Hypertension Father    Lung cancer Maternal Aunt    Colon polyps Brother    Colon cancer Neg Hx    Esophageal cancer Neg Hx    Rectal cancer Neg Hx    Stomach cancer Neg Hx     Past Surgical History:  Procedure Laterality Date   ABDOMINAL EXPOSURE N/A 01/22/2021   Procedure: LATERAL EXPOSURE FOR OBLIQUE LATERAL INTERBODY FUSION;  Surgeon: Cephus Shelling, MD;  Location: [facility] OR;  Service: Vascular;  Laterality: N/A;   ABDOMINAL HYSTERECTOMY     ANTERIOR LUMBAR FUSION N/A 01/22/2021   Procedure: Oblique Lateral Interbody Fusion Lumbar Four-Lumbar Five;  Surgeon: Bedelia Person, MD;  Location: [facility] OR;  Service: Neurosurgery;  Laterality: N/A;   KNEE ARTHROSCOPY WITH MEDIAL MENISECTOMY Left 08/19/2022   Procedure: LEFT KNEE ARTHROSCOPY WITH PARTIAL MEDIAL MENISECTOMY;  Surgeon: Tarry Kos, MD;  Location: [facility];  Service: Orthopedics;  Laterality: Left;   LEFT HEART CATH AND CORONARY ANGIOGRAPHY N/A 10/25/2018   Procedure: LEFT HEART CATH AND CORONARY ANGIOGRAPHY;  Surgeon: Swaziland, Peter M, MD;  [facility] INVASIVE  CV LAB;  Service: Cardiovascular;  Laterality: N/A;   LUMBAR PERCUTANEOUS PEDICLE SCREW 1 LEVEL N/A 01/22/2021   Procedure: LUMBAR PERCUTANEOUS PEDICLE SCREW LUBAR FOUR- FIVE;  Surgeon: Bedelia Person, MD;  Location: [facility] OR;  Service: Neurosurgery;  Laterality: N/A;   ROBOTIC ASSISTED LAPAROSCOPIC HYSTERECTOMY AND SALPINGECTOMY Bilateral 12/27/2019   Procedure: XI ROBOTIC ASSISTED TOTAL LAPAROSCOPIC HYSTERECTOMY AND SALPINGECTOMY;  Surgeon: Genia Del, MD;  Location: [facility];  Service: Gynecology;  Laterality: Bilateral;   SYNOVECTOMY Left 08/19/2022  Procedure: LEFT KNEE MAJOR SYNOVECTOMY;  Surgeon: Tarry Kos, MD;  Location: [facility];  Service: Orthopedics;  Laterality: Left;   TOTAL HIP ARTHROPLASTY Left 09/22/2017   Procedure: LEFT TOTAL HIP ARTHROPLASTY ANTERIOR APPROACH;  Surgeon: Tarry Kos, MD;  Location: [facility] OR;  Service: Orthopedics;  Laterality: Left;   TOTAL KNEE ARTHROPLASTY Right 01/12/2018   Procedure: RIGHT TOTAL KNEE ARTHROPLASTY;  Surgeon: Tarry Kos, MD;  Location: [facility] OR;  Service: Orthopedics;  Laterality: Right;   TUBAL LIGATION     Social History   Occupational History   Occupation: unemployed  Tobacco Use   Smoking status: Every Day    Current packs/day: 0.25    Average packs/day: 0.3 packs/day for 30.0 years (7.5 ttl pk-yrs)    Types: Cigarettes   Smokeless tobacco: Never  Vaping Use   Vaping status: Never Used  Substance and Sexual Activity   Alcohol use: Yes    Alcohol/week: 4.0 - 6.0 standard drinks of alcohol    Types: 4 - 6 Shots of liquor per week    Comment: occ   Drug use: Not Currently    Frequency: 2.0 times per week    Types: Marijuana    Comment: states she does not use marijuana any more   Sexual activity: Yes    Birth control/protection: None    Comment: declined insurance questions,des neg

## 2023-12-17 ENCOUNTER — Ambulatory Visit (INDEPENDENT_AMBULATORY_CARE_PROVIDER_SITE_OTHER): Payer: 59

## 2023-12-17 ENCOUNTER — Encounter: Payer: Self-pay | Admitting: Orthopaedic Surgery

## 2023-12-17 ENCOUNTER — Ambulatory Visit (INDEPENDENT_AMBULATORY_CARE_PROVIDER_SITE_OTHER): Payer: 59 | Admitting: Sports Medicine

## 2023-12-17 ENCOUNTER — Ambulatory Visit (INDEPENDENT_AMBULATORY_CARE_PROVIDER_SITE_OTHER): Payer: 59 | Admitting: Orthopaedic Surgery

## 2023-12-17 ENCOUNTER — Other Ambulatory Visit: Payer: Self-pay

## 2023-12-17 ENCOUNTER — Encounter: Payer: Self-pay | Admitting: Sports Medicine

## 2023-12-17 DIAGNOSIS — M25511 Pain in right shoulder: Secondary | ICD-10-CM | POA: Diagnosis not present

## 2023-12-17 DIAGNOSIS — M25552 Pain in left hip: Secondary | ICD-10-CM | POA: Diagnosis not present

## 2023-12-17 DIAGNOSIS — G8929 Other chronic pain: Secondary | ICD-10-CM

## 2023-12-17 MED ORDER — METHYLPREDNISOLONE ACETATE 40 MG/ML IJ SUSP
40.0000 mg | INTRAMUSCULAR | Status: AC | PRN
Start: 2023-12-17 — End: 2023-12-17
  Administered 2023-12-17: 40 mg via INTRA_ARTICULAR

## 2023-12-17 MED ORDER — LIDOCAINE HCL 1 % IJ SOLN
2.0000 mL | INTRAMUSCULAR | Status: AC | PRN
Start: 2023-12-17 — End: 2023-12-17
  Administered 2023-12-17: 2 mL

## 2023-12-17 MED ORDER — METHOCARBAMOL 750 MG PO TABS: 750.0000 mg | ORAL_TABLET | Freq: Two times a day (BID) | ORAL | 0 refills | Status: DC | PRN

## 2023-12-17 MED ORDER — BUPIVACAINE HCL 0.25 % IJ SOLN
2.0000 mL | INTRAMUSCULAR | Status: AC | PRN
Start: 2023-12-17 — End: 2023-12-17
  Administered 2023-12-17: 2 mL via INTRA_ARTICULAR

## 2023-12-17 MED ORDER — METHYLPREDNISOLONE 4 MG PO TBPK: ORAL_TABLET | ORAL | 2 refills | Status: DC

## 2023-12-17 NOTE — Progress Notes (Signed)
 Office Visit Note   Patient: Sarah Randall           Date of Birth: May 01, 1966           MRN: [id number] Visit Date: 12/17/2023              Requested by: Elizbeth Leita Ruth, FNP [address] [place],  [place] [address] PCP: Elizbeth Leita Ruth, FNP   Assessment & Plan: Visit Diagnoses:  1. Chronic right shoulder pain   2. Pain in left hip     Plan: Impression is chronic right shoulder pain and chronic left low back pain with left lower extremity radiculopathy.  In regards to the right shoulder, like to make a referral to Dr. Burnetta for ultrasound-guided cortisone injection to the glenohumeral joint.  In regards to the back pain, I sent in a steroid pack and muscle relaxer.  If her symptoms do not improve, we have recommended referral to [facility] or Dr. Eldonna.  She agrees to this plan.  She will follow-up as needed.  Follow-Up Instructions: Return if symptoms worsen or fail to improve.   Orders:  Orders Placed This Encounter  Procedures  . XR Shoulder Right  . XR HIP UNILAT W OR W/O PELVIS 2-3 VIEWS LEFT   Meds ordered this encounter  Medications  . methylPREDNISolone  (MEDROL  DOSEPAK) 4 MG TBPK tablet    Sig: Take as directed    Dispense:  21 tablet    Refill:  2  . methocarbamol  (ROBAXIN -750) 750 MG tablet    Sig: Take 1 tablet (750 mg total) by mouth 2 (two) times daily as needed for muscle spasms.    Dispense:  20 tablet    Refill:  0      Procedures: No procedures performed   Clinical Data: No additional findings.   Subjective: Chief Complaint  Patient presents with  . Right Shoulder - Pain  . Left Hip - Pain    HPI patient is a pleasant 58 year old female who comes in today with right shoulder and left hip pain.  Regards to her right shoulder, symptoms have been ongoing for about a year.  Initially started out intermittent but have become more constant over the past month.  She denies any injury.  The pain she has is primarily the top of her  shoulder and into the axilla.  Symptoms are worse when she is lifting or sleeping on her right side.  She denies any weakness to the right upper extremity.  She has been taking oxycodone  10 mg for which she gets from pain management.  She denies any previous injection to the right shoulder.  In regards to the left hip, the pain she has is across the entire low back with radiation into the left buttock and lateral hip and down the lateral thigh where she has associated paresthesias which also occur into the knee.  The pain is constant but worse when she is standing, sitting a long time as well as when she is bending over.  She denies any weakness to the left lower extremity.  Her chronic oxycodone  does not help with this pain.  She denies any bowel or bladder change or saddle paresthesias.  She is status post left total hip replacement 2018.  Currently denies any anterior thigh or groin pain.  She is also status post L4-5 fusion back in 2022.  Review of Systems as detailed in HPI.  All others reviewed and are negative.   Objective: Vital Signs: LMP  10/10/2019   Physical Exam well-developed well-nourished female no acute distress.  Alert and oriented x 3.  Ortho Exam right shoulder exam: Forward flexion to approximately 160 degrees.  Internal rotation to L5.  External rotation to about 75 degrees.  No tenderness over the [facility] joint.  She does have pain with empty can testing.  No pain with speeds or O'Brien's.  Full strength throughout.  Lumbar spine exam: She does have tenderness across the lower lumbar spine as well as into the left paraspinous musculature.  Increased pain with lumbar flexion and extension.  Negative straight leg raise.  No focal weakness.  She is neurovascularly intact distally.  No pain with logroll or FADIR testing.  Specialty Comments:  No specialty comments available.  Imaging: XR HIP UNILAT W OR W/O PELVIS 2-3 VIEWS LEFT Result Date: 12/17/2023 Well-seated prosthesis without  complication  XR Shoulder Right Result Date: 12/17/2023 Advanced AC joint degenerative changes.  Moderate glenohumeral joint degenerative changes    PMFS History: Patient Active Problem List   Diagnosis Date Noted  . Pigmented villonodular synovitis of left knee 08/19/2022  . Acute medial meniscus tear of left knee 07/21/2022  . Recurrent sinusitis 01/27/2022  . Trigger index finger of right hand 01/27/2022  . Acute otitis media 12/23/2021  . Multiple joint pain 02/10/2021  . Positive ANA (antinuclear antibody) 12/13/2020  . Trigger finger of left thumb 12/10/2020  . Lumbar radiculopathy 11/07/2020  . Chronic right-sided low back pain with right-sided sciatica 10/10/2020  . Sleep disturbance 10/10/2020  . Myalgia 10/10/2020  . Super obese 10/10/2020  . Abnormality of gait 10/10/2020  . Statin intolerance 09/17/2020  . Recurrent boils 09/17/2020  . Chronic bilateral low back pain 05/16/2020  . Postoperative state 12/27/2019  . Iron  deficiency 08/03/2019  . B12 deficiency 06/30/2019  . Chronic right shoulder pain 07/05/2018  . Trochanteric bursitis of left hip 07/05/2018  . Morbid obesity (HCC) 07/05/2018  . Body mass index 45.0-49.9, adult (HCC) 07/05/2018  . Marijuana user 05/26/2018  . Status post total right knee replacement 01/12/2018  . Status post total hip replacement, left 09/22/2017  . Hyperlipidemia 08/10/2017  . Mild depression 06/25/2017  . Essential hypertension 06/25/2017  . Tobacco abuse 06/25/2017  . Prediabetes 06/25/2017  . OSA on CPAP 06/25/2017  . Bronchitis 09/24/2016  . Leukocytosis 09/24/2016  . Radiculopathy, lumbar region 07/16/2016  . Degenerative lumbar disc 07/03/2016  . Synovitis of hip 04/20/2016  . Screening for breast cancer 12/28/2014  . Type 2 diabetes mellitus with obesity (HCC) 04/06/2014  . Pain in joint involving lower leg 05/13/2013   Past Medical History:  Diagnosis Date  . Anemia   . Anxiety   . Arthritis   . B12  deficiency 06/30/2019  . Depression   . GERD (gastroesophageal reflux disease)    occasionally takes OTC  . History of leukocytosis   . Hyperlipidemia   . Hypertension   . Low back pain   . Obesity   . Pain in right buttock    Goes down to Right leg to lateral aspect of calf.  . Pre-diabetes   . Sleep apnea    tested 2014 - unable to tolerate machine  . Spondylolisthesis at L4-L5 level     Family History  Problem Relation Age of Onset  . Hypertension Mother   . Diabetes Mother   . Breast cancer Mother 46  . Hypertension Father   . Lung cancer Maternal Aunt   . Colon polyps Brother   .  Colon cancer Neg Hx   . Esophageal cancer Neg Hx   . Rectal cancer Neg Hx   . Stomach cancer Neg Hx     Past Surgical History:  Procedure Laterality Date  . ABDOMINAL EXPOSURE N/A 01/22/2021   Procedure: LATERAL EXPOSURE FOR OBLIQUE LATERAL INTERBODY FUSION;  Surgeon: Gretta Lonni PARAS, MD;  Location: [facility] OR;  Service: Vascular;  Laterality: N/A;  . ABDOMINAL HYSTERECTOMY    . ANTERIOR LUMBAR FUSION N/A 01/22/2021   Procedure: Oblique Lateral Interbody Fusion Lumbar Four-Lumbar Five;  Surgeon: Debby Dorn MATSU, MD;  Location: [facility] OR;  Service: Neurosurgery;  Laterality: N/A;  . KNEE ARTHROSCOPY WITH MEDIAL MENISECTOMY Left 08/19/2022   Procedure: LEFT KNEE ARTHROSCOPY WITH PARTIAL MEDIAL MENISECTOMY;  Surgeon: Jerri Kay HERO, MD;  Location: [facility];  Service: Orthopedics;  Laterality: Left;  . LEFT HEART CATH AND CORONARY ANGIOGRAPHY N/A 10/25/2018   Procedure: LEFT HEART CATH AND CORONARY ANGIOGRAPHY;  Surgeon: Jordan, Peter M, MD;  Location: [facility] INVASIVE CV LAB;  Service: Cardiovascular;  Laterality: N/A;  . LUMBAR PERCUTANEOUS PEDICLE SCREW 1 LEVEL N/A 01/22/2021   Procedure: LUMBAR PERCUTANEOUS PEDICLE SCREW LUBAR FOUR- FIVE;  Surgeon: Debby Dorn MATSU, MD;  Location: [facility] OR;  Service: Neurosurgery;  Laterality: N/A;  . ROBOTIC ASSISTED LAPAROSCOPIC HYSTERECTOMY AND  SALPINGECTOMY Bilateral 12/27/2019   Procedure: XI ROBOTIC ASSISTED TOTAL LAPAROSCOPIC HYSTERECTOMY AND SALPINGECTOMY;  Surgeon: Lavoie, Marie-Lyne, MD;  Location: [facility];  Service: Gynecology;  Laterality: Bilateral;  . SYNOVECTOMY Left 08/19/2022   Procedure: LEFT KNEE MAJOR SYNOVECTOMY;  Surgeon: Jerri Kay HERO, MD;  Location: [facility];  Service: Orthopedics;  Laterality: Left;  . TOTAL HIP ARTHROPLASTY Left 09/22/2017   Procedure: LEFT TOTAL HIP ARTHROPLASTY ANTERIOR APPROACH;  Surgeon: Jerri Kay HERO, MD;  Location: [facility] OR;  Service: Orthopedics;  Laterality: Left;  . TOTAL KNEE ARTHROPLASTY Right 01/12/2018   Procedure: RIGHT TOTAL KNEE ARTHROPLASTY;  Surgeon: Jerri Kay HERO, MD;  Location: [facility] OR;  Service: Orthopedics;  Laterality: Right;  . TUBAL LIGATION     Social History   Occupational History  . Occupation: unemployed  Tobacco Use  . Smoking status: Every Day    Current packs/day: 0.25    Average packs/day: 0.3 packs/day for 30.0 years (7.5 ttl pk-yrs)    Types: Cigarettes  . Smokeless tobacco: Never  Vaping Use  . Vaping status: Never Used  Substance and Sexual Activity  . Alcohol use: Yes    Alcohol/week: 4.0 - 6.0 standard drinks of alcohol    Types: 4 - 6 Shots of liquor per week    Comment: occ  . Drug use: Not Currently    Frequency: 2.0 times per week    Types: Marijuana    Comment: states she does not use marijuana any more  . Sexual activity: Yes    Birth control/protection: None    Comment: declined insurance questions,des neg

## 2023-12-17 NOTE — Progress Notes (Signed)
   Procedure Note  Patient: Sarah Randall             Date of Birth: Feb 01, 1966           MRN: [id number]             Visit Date: 12/17/2023  Procedures: Visit Diagnoses:  1. Chronic right shoulder pain    Large Joint Inj: R glenohumeral on 12/17/2023 11:14 AM Indications: pain Details: 22 G 3.5 in needle, ultrasound-guided posterior approach Medications: 2 mL lidocaine  1 %; 2 mL bupivacaine  0.25 %; 40 mg methylPREDNISolone  acetate 40 MG/ML Outcome: tolerated well, no immediate complications  US -guided glenohumeral joint injection, right shoulder After discussion on risks/benefits/indications, informed verbal consent was obtained. A timeout was then performed. The patient was positioned lying lateral recumbent on examination table. The patient's shoulder was prepped with betadine and multiple alcohol swabs and utilizing ultrasound guidance, the patient's glenohumeral joint was identified on ultrasound. Using ultrasound guidance a 22-gauge, 3.5 inch needle with a mixture of 2:2:1 cc's lidocaine :bupivicaine:depomedrol was directed from a lateral to medial direction via in-plane technique into the glenohumeral joint with visualization of appropriate spread of injectate into the joint. Patient tolerated the procedure well without immediate complications.      Procedure, treatment alternatives, risks and benefits explained, specific risks discussed. Consent was given by the patient. Immediately prior to procedure a time out was called to verify the correct patient, procedure, equipment, support staff and site/side marked as required. Patient was prepped and draped in the usual sterile fashion.     - follow-up with Dr. Jerri / Morna Bimler [facility] as indicated; I am happy to see them as needed  Lonell Sprang, DO Primary Care Sports Medicine Physician  [facility]  This note was dictated using Dragon naturally speaking software and may contain errors in syntax, spelling,  or content which have not been identified prior to signing this note.

## 2023-12-21 ENCOUNTER — Encounter: Payer: Self-pay | Admitting: Internal Medicine

## 2024-02-07 ENCOUNTER — Encounter: Payer: Self-pay | Admitting: Neurology

## 2024-02-14 NOTE — Progress Notes (Unsigned)
 Assessment/Plan:   Tremor  -Very difficult to tell based on patient's history what the actual diagnosis was in the past.  She really does not describe tremor at all in the last 5 years and describes it previously as "episodes of tremor."  I do not think she has essential tremor.  It would certainly break through over a 5-year time span.  I told her that I will recommend to primary care that they can take her off of the propranolol, and then she can just use it on an as-needed basis if she gets any tremor.  She was certainly agreeable.  I definitely do not see any evidence of a neurodegenerative process such as Parkinson's.  She was happy with this.  Chronic pain syndrome  -On opioids.  Subjective:   Sarah Randall was seen today in the movement disorders clinic for neurologic consultation at the request of Felix Pacini, *.  Patient is a 58 year old female with a history of type 2 diabetes, hypertension, hyperlipidemia, chronic pain syndrome on opioids who presents for the evaluation of rest tremor.  Rest tremor is noted to be 1 year in duration per notes.   Pt states, however, that since she was put on propranolol over 5 years ago for an episode of tremor and it has been well controlled ever since.  She hasn't had an "episode" of tremor since the time that she was placed on it.  She states that prior to the addition of propranolol her R hand would just start shaking "out of nowhere"; "I would pick up stuff and it would just start shaking."  She has not had shaking episode now in 5 years.  Never had shaking in the R leg or in the L side.  No falls.  No lightheadedness or near syncope.  No fam hx of similar.  She does state that about 5 years ago her diabetes may have not been under good control when all this started, but it is under pretty good control now.  Lactulose is on her medication list.  She denies a history of liver failure.  She states that she uses it as needed to have a bowel  movement.  Tremor improving medications: Currently on propranolol, 10 mg daily for tremor; on Lyrica , 75 mg 3 times per day for pain (its listed as an allergy but she is on it)  Neuroimaging of the brain has not previously been performed.     ALLERGIES:   Allergies  Allergen Reactions   Pregabalin Swelling   Cymbalta [Duloxetine Hcl] Nausea Only   Escitalopram     Made her feel out of it.   Gabapentin Swelling   Lipitor [Atorvastatin] Other (See Comments)    Stabbing pains in legs   Pravachol [Pravastatin]     Sharp pains in LT leg   Repatha [Evolocumab] Other (See Comments)    Nausea, GI upset   Zoloft [Sertraline Hcl] Other (See Comments)    tremors    CURRENT MEDICATIONS:  Current Outpatient Medications  Medication Instructions   B-12 5,000 mcg, Every other day   diclofenac Sodium (VOLTAREN) 2 g, Topical, 4 times daily   esomeprazole (NEXIUM) 40 MG capsule TAKE 1 CAPSULE BY MOUTH EVERY DAY   hydrochlorothiazide (HYDRODIURIL) 25 MG tablet TAKE 1 TABLET BY MOUTH EVERY DAY   HYDROcodone-acetaminophen (NORCO) 5-325 MG tablet 1 tablet, Oral, Every 6 hours PRN   ipratropium (ATROVENT) 0.03 % nasal spray 2 sprays, Each Nare, Every 12 hours  lactulose (CHRONULAC) 20 g, Oral, Daily PRN   lidocaine (LIDODERM) 5 % 1 patch, Transdermal, Every 24 hours, Remove & Discard patch within 12 hours or as directed by MD   loratadine (CLARITIN) 10 MG tablet TAKE 1 TABLET BY MOUTH EVERY DAY AS NEEDED FOR ALLERGY   losartan (COZAAR) 100 mg, Oral, Daily   nitroGLYCERIN (NITROSTAT) 0.4 mg, Sublingual, Every 5 min PRN   oxyCODONE-acetaminophen (PERCOCET) 10-325 MG tablet 1 tablet, Every 6 hours   pregabalin (LYRICA) 75 mg, 2 times daily   propranolol (INDERAL) 10 MG tablet TAKE 1 TABLET BY MOUTH TWICE A DAY    Objective:   PHYSICAL EXAMINATION:    VITALS:   Vitals:   02/17/24 0912  BP: 124/82  Pulse: 85  SpO2: 99%  Weight: 295 lb 6.4 oz (134 kg)  Height: 5\' 4"  (1.626 m)    GEN:   The patient appears stated age and is in NAD. HEENT:  Normocephalic, atraumatic.  The mucous membranes are moist. The superficial temporal arteries are without ropiness or tenderness. CV:  RRR Lungs:  CTAB Neck/HEME:  There are no carotid bruits bilaterally.  Neurological examination:  Orientation: The patient is alert and oriented x3.  Cranial nerves: There is good facial symmetry.  Extraocular muscles are intact. The visual fields are full to confrontational testing. The speech is fluent and clear. Soft palate rises symmetrically and there is no tongue deviation. Hearing is intact to conversational tone. Sensation: Sensation is intact to light touch throughout (facial, trunk, extremities). Vibration is intact at the bilateral big toe. There is no extinction with double simultaneous stimulation.  Motor: Strength is 5/5 in the bilateral upper and lower extremities.   Shoulder shrug is equal and symmetric.  There is no pronator drift. Deep tendon reflexes: Deep tendon reflexes are 1/4 at the bilateral biceps, triceps, brachioradialis, patella and achilles. Plantar responses are downgoing bilaterally.  Movement examination: Tone: There is normal tone in the bilateral upper extremities.  The tone in the lower extremities is normal.  Abnormal movements: There is no rest tremor.  There is no postural or intention tremor.  No asterixis.  No myoclonus.  Archimedes spirals are drawn well.     Coordination:  There is no decremation with RAM's, ,with any form of RAMS, including alternating supination and pronation of the forearm, hand opening and closing, finger taps, heel taps and toe taps.  Gait and Station: The patient has no difficulty arising out of a deep-seated chair without the use of the hands. The patient's stride length is good.   I have reviewed and interpreted the following labs independently   Chemistry      Component Value Date/Time   NA 131 (L) 06/28/2023 1516   NA 135 01/08/2022  1218   K 3.8 06/28/2023 1516   CL 96 (L) 06/28/2023 1516   CO2 27 06/28/2023 1516   BUN 13 06/28/2023 1516   BUN 10 01/08/2022 1218   CREATININE 1.05 (H) 06/28/2023 1516   CREATININE 0.94 06/15/2019 1012      Component Value Date/Time   CALCIUM 8.7 (L) 06/28/2023 1516   ALKPHOS 83 06/28/2023 1516   AST 18 06/28/2023 1516   AST 17 06/15/2019 1012   ALT 15 06/28/2023 1516   ALT 16 06/15/2019 1012   BILITOT 0.8 06/28/2023 1516   BILITOT 0.4 01/08/2022 1218   BILITOT 0.3 06/15/2019 1012      Lab Results  Component Value Date   TSH 1.40 05/26/2019   Lab Results  Component Value Date   WBC 11.6 (H) 06/28/2023   HGB 15.2 (H) 06/28/2023   HCT 45.3 06/28/2023   MCV 91.9 06/28/2023   PLT 313 06/28/2023   Lab Results  Component Value Date   HGBA1C 6.7 01/08/2022      Total time spent on today's visit was 45 minutes, including both face-to-face time and nonface-to-face time.  Time included that spent on review of records (prior notes available to me/labs/imaging if pertinent), discussing treatment and goals, answering patient's questions and coordinating care.  Cc:  Felix Pacini, FNP

## 2024-02-15 ENCOUNTER — Ambulatory Visit: Payer: 59 | Admitting: Orthopaedic Surgery

## 2024-02-17 ENCOUNTER — Encounter: Payer: Self-pay | Admitting: Internal Medicine

## 2024-02-17 ENCOUNTER — Encounter: Payer: Self-pay | Admitting: Neurology

## 2024-02-17 ENCOUNTER — Ambulatory Visit (INDEPENDENT_AMBULATORY_CARE_PROVIDER_SITE_OTHER): Admitting: Neurology

## 2024-02-17 VITALS — BP 124/82 | HR 85 | Ht 64.0 in | Wt 295.4 lb

## 2024-02-17 DIAGNOSIS — G894 Chronic pain syndrome: Secondary | ICD-10-CM | POA: Diagnosis not present

## 2024-02-17 DIAGNOSIS — R251 Tremor, unspecified: Secondary | ICD-10-CM | POA: Diagnosis not present

## 2024-02-17 NOTE — Patient Instructions (Signed)
 It was good to see you today!  I am going to recommend to Felix Pacini, FNP that the propranolol be discontinued and just used on an as needed basis.  The physicians and staff at [facility] are committed to providing excellent care. You may receive a survey requesting feedback about your experience at our office. We strive to receive "very good" responses to the survey questions. If you feel that your experience would prevent you from giving the office a "very good " response, please contact our office to try to remedy the situation. We may be reached at [phone]. Thank you for taking the time out of your busy day to complete the survey.

## 2024-04-20 ENCOUNTER — Telehealth: Payer: Self-pay

## 2024-04-20 ENCOUNTER — Other Ambulatory Visit (INDEPENDENT_AMBULATORY_CARE_PROVIDER_SITE_OTHER): Payer: Self-pay

## 2024-04-20 ENCOUNTER — Ambulatory Visit: Admitting: Orthopaedic Surgery

## 2024-04-20 ENCOUNTER — Encounter: Payer: Self-pay | Admitting: Internal Medicine

## 2024-04-20 DIAGNOSIS — Z6841 Body Mass Index (BMI) 40.0 and over, adult: Secondary | ICD-10-CM | POA: Diagnosis not present

## 2024-04-20 DIAGNOSIS — Z9889 Other specified postprocedural states: Secondary | ICD-10-CM

## 2024-04-20 DIAGNOSIS — G8929 Other chronic pain: Secondary | ICD-10-CM

## 2024-04-20 DIAGNOSIS — M1712 Unilateral primary osteoarthritis, left knee: Secondary | ICD-10-CM | POA: Diagnosis not present

## 2024-04-20 DIAGNOSIS — M25562 Pain in left knee: Secondary | ICD-10-CM

## 2024-04-20 NOTE — Telephone Encounter (Signed)
Please submit for left knee gel inj °

## 2024-04-20 NOTE — Progress Notes (Signed)
 Office Visit Note   Patient: Sarah Randall           Date of Birth: [birth year]           MRN: [id number] Visit Date: 04/20/2024              Requested by: Janifer Meigs, FNP [address] [place],  [place] [address] PCP: Janifer Meigs, FNP   Assessment & Plan: Visit Diagnoses:  1. Primary osteoarthritis of left knee   2. S/P left knee arthroscopy   3. Chronic pain of left knee   4. Body mass index 50.0-59.9, adult (HCC)     Plan: History of Present Illness Sarah Randall is a 58 year old female with degenerative changes and rotator cuff tears in the right shoulder and left knee arthritis who presents with shoulder and knee pain.  She experiences ongoing pain in the right shoulder, with an MRI showing degenerative changes and rotator cuff tears. No specific treatments or interventions have been undertaken for the shoulder pain.  Her left knee, previously treated with medial meniscus debridement, has developed swelling and pain over the past month to month and a half, especially during ambulation. The pain resembles that experienced before the initial meniscus surgery. She has not received a recent cortisone injection and has not tried gel injections.  She also experiences sciatic pain radiating down the back of her knee. She has not consulted a back specialist recently.  Physical Exam MUSCULOSKELETAL: Full extension and flexion past 90 degrees in the knee, stable ligaments, trace effusion, and medial joint line tenderness.  Results RADIOLOGY Right shoulder MRI: Report from [facility] reviewed Left knee X-ray: Bone on bone in the medial compartment, significant arthritis (04/20/2024)  Assessment and Plan Rotator cuff tear and degenerative changes in right shoulder MRI shows degenerative changes and rotator cuff tears.  Recommend evaluation for potential surgery with my shoulder partners. - Refer to shoulder specialist, Dr. Rozelle Corning or Dr. Hermina Loosen, for evaluation and  management.  Osteoarthritis of left knee X-rays confirm significant osteoarthritis with bone-on-bone contact in the medial compartment, causing pain and swelling. Prefers gel injections over cortisone due to past side effects. - Initiate gel injection approval for left knee. - Advise knee brace for pain management.  This patient is diagnosed with osteoarthritis of the knee(s).    Radiographs show evidence of joint space narrowing, osteophytes, subchondral sclerosis and/or subchondral cysts.  This patient has knee pain which interferes with functional and activities of daily living.    This patient has experienced inadequate response, adverse effects and/or intolerance with conservative treatments such as acetaminophen , NSAIDS, topical creams, physical therapy or regular exercise, knee bracing and/or weight loss.   This patient has experienced inadequate response or has a contraindication to intra articular steroid injections for at least 3 months.   This patient is not scheduled to have a total knee replacement within 6 months of starting treatment with viscosupplementation.   Follow-Up Instructions: No follow-ups on file.   Orders:  Orders Placed This Encounter  Procedures   XR KNEE 3 VIEW LEFT   No orders of the defined types were placed in this encounter.     Procedures: No procedures performed   Clinical Data: No additional findings.   Subjective: Chief Complaint  Patient presents with   Left Knee - Pain   Right Shoulder - Pain    HPI  Review of Systems  Constitutional: Negative.   HENT: Negative.    Eyes: Negative.  Respiratory: Negative.    Cardiovascular: Negative.   Endocrine: Negative.   Musculoskeletal: Negative.   Neurological: Negative.   Hematological: Negative.   Psychiatric/Behavioral: Negative.    All other systems reviewed and are negative.    Objective: Vital Signs: LMP 10/10/2019   Physical Exam Vitals and nursing note reviewed.   Constitutional:      Appearance: She is well-developed.  HENT:     Head: Normocephalic and atraumatic.  Pulmonary:     Effort: Pulmonary effort is normal.  Abdominal:     Palpations: Abdomen is soft.  Musculoskeletal:     Cervical back: Neck supple.  Skin:    General: Skin is warm.     Capillary Refill: Capillary refill takes less than 2 seconds.  Neurological:     Mental Status: She is alert and oriented to person, place, and time.  Psychiatric:        Behavior: Behavior normal.        Thought Content: Thought content normal.        Judgment: Judgment normal.     Ortho Exam  Specialty Comments:  No specialty comments available.  Imaging: XR KNEE 3 VIEW LEFT Result Date: 04/20/2024 X-rays of the left knee show near bone-on-bone joint space narrowing of the medial compartment with osteophytic spurring.    PMFS History: Patient Active Problem List   Diagnosis Date Noted   Pigmented villonodular synovitis of left knee 08/19/2022   Acute medial meniscus tear of left knee 07/21/2022   Recurrent sinusitis 01/27/2022   Trigger index finger of right hand 01/27/2022   Acute otitis media 12/23/2021   Multiple joint pain 02/10/2021   Positive ANA (antinuclear antibody) 12/13/2020   Trigger finger of left thumb 12/10/2020   Lumbar radiculopathy 11/07/2020   Chronic right-sided low back pain with right-sided sciatica 10/10/2020   Sleep disturbance 10/10/2020   Myalgia 10/10/2020   Super obese 10/10/2020   Abnormality of gait 10/10/2020   Statin intolerance 09/17/2020   Recurrent boils 09/17/2020   Chronic bilateral low back pain 05/16/2020   Postoperative state 12/27/2019   Iron  deficiency 08/03/2019   B12 deficiency 06/30/2019   Chronic right shoulder pain 07/05/2018   Trochanteric bursitis of left hip 07/05/2018   Morbid obesity (HCC) 07/05/2018   Body mass index 45.0-49.9, adult (HCC) 07/05/2018   Marijuana user 05/26/2018   Status post total right knee  replacement 01/12/2018   Status post total hip replacement, left 09/22/2017   Hyperlipidemia 08/10/2017   Mild depression 06/25/2017   Essential hypertension 06/25/2017   Tobacco abuse 06/25/2017   Prediabetes 06/25/2017   OSA on CPAP 06/25/2017   Bronchitis 09/24/2016   Leukocytosis 09/24/2016   Radiculopathy, lumbar region 07/16/2016   Degenerative lumbar disc 07/03/2016   Synovitis of hip 04/20/2016   Screening for breast cancer 12/28/2014   Type 2 diabetes mellitus with obesity (HCC) 04/06/2014   Pain in joint involving lower leg 05/13/2013   Past Medical History:  Diagnosis Date   Anemia    Anxiety    Arthritis    B12 deficiency 06/30/2019   Depression    GERD (gastroesophageal reflux disease)    occasionally takes OTC   History of leukocytosis    Hyperlipidemia    Hypertension    Low back pain    Obesity    Pain in right buttock    Goes down to Right leg to lateral aspect of calf.   Pre-diabetes    Sleep apnea    tested 2014 - unable  to tolerate machine   Spondylolisthesis at L4-L5 level     Family History  Problem Relation Age of Onset   Hypertension Mother    Diabetes Mother    Breast cancer Mother 88   Hypertension Father    Lung cancer Maternal Aunt    Colon polyps Brother    Colon cancer Neg Hx    Esophageal cancer Neg Hx    Rectal cancer Neg Hx    Stomach cancer Neg Hx     Past Surgical History:  Procedure Laterality Date   ABDOMINAL EXPOSURE N/A 01/22/2021   Procedure: LATERAL EXPOSURE FOR OBLIQUE LATERAL INTERBODY FUSION;  Surgeon: Young Hensen, MD;  Location: [facility] OR;  Service: Vascular;  Laterality: N/A;   ABDOMINAL HYSTERECTOMY     ANTERIOR LUMBAR FUSION N/A 01/22/2021   Procedure: Oblique Lateral Interbody Fusion Lumbar Four-Lumbar Five;  Surgeon: Van Gelinas, MD;  Location: [facility] OR;  Service: Neurosurgery;  Laterality: N/A;   KNEE ARTHROSCOPY WITH MEDIAL MENISECTOMY Left 08/19/2022   Procedure: LEFT KNEE ARTHROSCOPY WITH PARTIAL  MEDIAL MENISECTOMY;  Surgeon: Wes Hamman, MD;  Location: [facility];  Service: Orthopedics;  Laterality: Left;   LEFT HEART CATH AND CORONARY ANGIOGRAPHY N/A 10/25/2018   Procedure: LEFT HEART CATH AND CORONARY ANGIOGRAPHY;  Surgeon: Swaziland, Peter M, MD;  Location: [facility] INVASIVE CV LAB;  Service: Cardiovascular;  Laterality: N/A;   LUMBAR PERCUTANEOUS PEDICLE SCREW 1 LEVEL N/A 01/22/2021   Procedure: LUMBAR PERCUTANEOUS PEDICLE SCREW LUBAR FOUR- FIVE;  Surgeon: Van Gelinas, MD;  Location: [facility] OR;  Service: Neurosurgery;  Laterality: N/A;   ROBOTIC ASSISTED LAPAROSCOPIC HYSTERECTOMY AND SALPINGECTOMY Bilateral 12/27/2019   Procedure: XI ROBOTIC ASSISTED TOTAL LAPAROSCOPIC HYSTERECTOMY AND SALPINGECTOMY;  Surgeon: Lavoie, Marie-Lyne, MD;  Location: [facility] [place];  Service: Gynecology;  Laterality: Bilateral;   SYNOVECTOMY Left 08/19/2022   Procedure: LEFT KNEE MAJOR SYNOVECTOMY;  Surgeon: Wes Hamman, MD;  Location:  SURGERY CENTER;  Service: Orthopedics;  Laterality: Left;   TOTAL HIP ARTHROPLASTY Left 09/22/2017   Procedure: LEFT TOTAL HIP ARTHROPLASTY ANTERIOR APPROACH;  Surgeon: Wes Hamman, MD;  Location: [facility] OR;  Service: Orthopedics;  Laterality: Left;   TOTAL KNEE ARTHROPLASTY Right 01/12/2018   Procedure: RIGHT TOTAL KNEE ARTHROPLASTY;  Surgeon: Wes Hamman, MD;  Location: [facility] OR;  Service: Orthopedics;  Laterality: Right;   TUBAL LIGATION     Social History   Occupational History   Occupation: unemployed  Tobacco Use   Smoking status: Every Day    Current packs/day: 0.25    Average packs/day: 0.3 packs/day for 30.0 years (7.5 ttl pk-yrs)    Types: Cigarettes   Smokeless tobacco: Never  Vaping Use   Vaping status: Never Used  Substance and Sexual Activity   Alcohol use: Yes    Alcohol/week: 4.0 - 6.0 standard drinks of alcohol    Types: 4 - 6 Shots of liquor per week    Comment: occ   Drug use: Not Currently    Frequency: 2.0 times  per week    Types: Marijuana    Comment: states she does not use marijuana any more   Sexual activity: Yes    Birth control/protection: None    Comment: declined insurance questions,des neg

## 2024-04-26 ENCOUNTER — Telehealth: Payer: Self-pay | Admitting: Orthopaedic Surgery

## 2024-04-26 NOTE — Telephone Encounter (Signed)
 Patient called and want to know if she was approved for the gel shot. CB#[phone]

## 2024-04-27 NOTE — Telephone Encounter (Signed)
Talked with patient concerning gel injection.  

## 2024-04-27 NOTE — Telephone Encounter (Signed)
 VOB has been submitted for durolane, left knee

## 2024-05-10 ENCOUNTER — Encounter: Payer: Self-pay | Admitting: Internal Medicine

## 2024-05-10 ENCOUNTER — Telehealth (HOSPITAL_BASED_OUTPATIENT_CLINIC_OR_DEPARTMENT_OTHER): Payer: Self-pay | Admitting: Orthopaedic Surgery

## 2024-05-10 ENCOUNTER — Ambulatory Visit (HOSPITAL_BASED_OUTPATIENT_CLINIC_OR_DEPARTMENT_OTHER): Admitting: Orthopaedic Surgery

## 2024-05-10 ENCOUNTER — Ambulatory Visit (HOSPITAL_BASED_OUTPATIENT_CLINIC_OR_DEPARTMENT_OTHER): Payer: Self-pay | Admitting: Orthopaedic Surgery

## 2024-05-10 ENCOUNTER — Encounter (HOSPITAL_BASED_OUTPATIENT_CLINIC_OR_DEPARTMENT_OTHER): Payer: Self-pay

## 2024-05-10 ENCOUNTER — Other Ambulatory Visit (HOSPITAL_BASED_OUTPATIENT_CLINIC_OR_DEPARTMENT_OTHER): Payer: Self-pay

## 2024-05-10 DIAGNOSIS — M7541 Impingement syndrome of right shoulder: Secondary | ICD-10-CM

## 2024-05-10 MED ORDER — ACETAMINOPHEN 500 MG PO TABS
500.0000 mg | ORAL_TABLET | Freq: Three times a day (TID) | ORAL | 0 refills | Status: AC
  Filled 2024-05-10: qty 30, 10d supply, fill #0

## 2024-05-10 MED ORDER — ASPIRIN 325 MG PO TBEC
325.0000 mg | DELAYED_RELEASE_TABLET | Freq: Every day | ORAL | 0 refills | Status: AC
  Filled 2024-05-10: qty 14, 14d supply, fill #0

## 2024-05-10 NOTE — Telephone Encounter (Signed)
 error

## 2024-05-10 NOTE — Progress Notes (Signed)
 Chief Complaint: Right shoulder pain     History of Present Illness:    Sarah Randall is a 58 y.o. female with several years of right shoulder pain.  She did not have any specific traumatic injury.  Her pain is worse with overhead activity or laying directly on the side.  She has previously undergone physical therapy.  She has had an injection in the past which did not give her long-term relief.  Denies any weakness with overhead range of motion predominantly just pain.    PMH/PSH/Family History/Social History/Meds/Allergies:    Past Medical History:  Diagnosis Date   Anemia    Anxiety    Arthritis    B12 deficiency 06/30/2019   Depression    GERD (gastroesophageal reflux disease)    occasionally takes OTC   History of leukocytosis    Hyperlipidemia    Hypertension    Low back pain    Obesity    Pain in right buttock    Goes down to Right leg to lateral aspect of calf.   Pre-diabetes    Sleep apnea    tested 2014 - unable to tolerate machine   Spondylolisthesis at L4-L5 level    Past Surgical History:  Procedure Laterality Date   ABDOMINAL EXPOSURE N/A 01/22/2021   Procedure: LATERAL EXPOSURE FOR OBLIQUE LATERAL INTERBODY FUSION;  Surgeon: Young Hensen, MD;  Location: [facility] OR;  Service: Vascular;  Laterality: N/A;   ABDOMINAL HYSTERECTOMY     ANTERIOR LUMBAR FUSION N/A 01/22/2021   Procedure: Oblique Lateral Interbody Fusion Lumbar Four-Lumbar Five;  Surgeon: Van Gelinas, MD;  Location: [facility] OR;  Service: Neurosurgery;  Laterality: N/A;   KNEE ARTHROSCOPY WITH MEDIAL MENISECTOMY Left 08/19/2022   Procedure: LEFT KNEE ARTHROSCOPY WITH PARTIAL MEDIAL MENISECTOMY;  Surgeon: Wes Hamman, MD;  Location: [facility];  Service: Orthopedics;  Laterality: Left;   LEFT HEART CATH AND CORONARY ANGIOGRAPHY N/A 10/25/2018   Procedure: LEFT HEART CATH AND CORONARY ANGIOGRAPHY;  Surgeon: Swaziland, Peter M, MD;  Location: [facility] INVASIVE CV LAB;  Service:  Cardiovascular;  Laterality: N/A;   LUMBAR PERCUTANEOUS PEDICLE SCREW 1 LEVEL N/A 01/22/2021   Procedure: LUMBAR PERCUTANEOUS PEDICLE SCREW LUBAR FOUR- FIVE;  Surgeon: Van Gelinas, MD;  Location: [facility] OR;  Service: Neurosurgery;  Laterality: N/A;   ROBOTIC ASSISTED LAPAROSCOPIC HYSTERECTOMY AND SALPINGECTOMY Bilateral 12/27/2019   Procedure: XI ROBOTIC ASSISTED TOTAL LAPAROSCOPIC HYSTERECTOMY AND SALPINGECTOMY;  Surgeon: Lavoie, Marie-Lyne, MD;  Location: [facility] ;  Service: Gynecology;  Laterality: Bilateral;   SYNOVECTOMY Left 08/19/2022   Procedure: LEFT KNEE MAJOR SYNOVECTOMY;  Surgeon: Wes Hamman, MD;  Location: [facility];  Service: Orthopedics;  Laterality: Left;   TOTAL HIP ARTHROPLASTY Left 09/22/2017   Procedure: LEFT TOTAL HIP ARTHROPLASTY ANTERIOR APPROACH;  Surgeon: Wes Hamman, MD;  Location: [facility] OR;  Service: Orthopedics;  Laterality: Left;   TOTAL KNEE ARTHROPLASTY Right 01/12/2018   Procedure: RIGHT TOTAL KNEE ARTHROPLASTY;  Surgeon: Wes Hamman, MD;  Location: [facility] OR;  Service: Orthopedics;  Laterality: Right;   TUBAL LIGATION     Social History   Socioeconomic History   Marital status: Single    Spouse name: Not on file   Number of children: 2   Years of education: 9   Highest education level: Not on file  Occupational History   Occupation: unemployed  Tobacco Use   Smoking status: Every Day    Current packs/day: 0.25  Average packs/day: 0.3 packs/day for 30.0 years (7.5 ttl pk-yrs)    Types: Cigarettes   Smokeless tobacco: Never  Vaping Use   Vaping status: Never Used  Substance and Sexual Activity   Alcohol use: Yes    Alcohol/week: 4.0 - 6.0 standard drinks of alcohol    Types: 4 - 6 Shots of liquor per week    Comment: occ   Drug use: Not Currently    Frequency: 2.0 times per week    Types: Marijuana    Comment: states she does not use marijuana any more   Sexual activity: Yes    Birth control/protection: None     Comment: declined insurance questions,des neg  Other Topics Concern   Not on file  Social History Narrative   Left handed    disability   Social Drivers of Health   Financial Resource Strain: Not on file  Food Insecurity: Not on file  Transportation Needs: Not on file  Physical Activity: Not on file  Stress: Not on file  Social Connections: Unknown (04/21/2022)   Received from [employer], [facility]   Social Network    Social Network: Not on file   Family History  Problem Relation Age of Onset   Hypertension Mother    Diabetes Mother    Breast cancer Mother 75   Hypertension Father    Lung cancer Maternal Aunt    Colon polyps Brother    Colon cancer Neg Hx    Esophageal cancer Neg Hx    Rectal cancer Neg Hx    Stomach cancer Neg Hx    Allergies  Allergen Reactions   Pregabalin Swelling   Cymbalta  [Duloxetine  Hcl] Nausea Only   Escitalopram      Made her feel out of it.   Gabapentin Swelling   Lipitor [Atorvastatin ] Other (See Comments)    Stabbing pains in legs   Pravachol  [Pravastatin ]     Sharp pains in LT leg   Repatha  [Evolocumab ] Other (See Comments)    Nausea, GI upset   Zoloft  [Sertraline  Hcl] Other (See Comments)    tremors   Current Outpatient Medications  Medication Sig Dispense Refill   acetaminophen  (TYLENOL ) 500 MG tablet Take 1 tablet (500 mg total) by mouth every 8 (eight) hours for 10 days. 30 tablet 0   aspirin  EC 325 MG tablet Take 1 tablet (325 mg total) by mouth daily. 14 tablet 0   Cyanocobalamin  (B-12) 5000 MCG CAPS Take 5,000 mcg by mouth every other day.     diclofenac  Sodium (VOLTAREN ) 1 % GEL Apply 2 g topically 4 (four) times daily. 100 g 1   esomeprazole  (NEXIUM ) 40 MG capsule TAKE 1 CAPSULE BY MOUTH EVERY DAY 90 capsule 0   hydrochlorothiazide  (HYDRODIURIL ) 25 MG tablet TAKE 1 TABLET BY MOUTH EVERY DAY 90 tablet 0   HYDROcodone -acetaminophen  (NORCO) 5-325 MG tablet Take 1 tablet by mouth every 6 (six) hours as needed. 20 tablet  0   ipratropium (ATROVENT ) 0.03 % nasal spray Place 2 sprays into both nostrils every 12 (twelve) hours. 30 mL 0   lactulose  (CHRONULAC ) 10 GM/15ML solution Take 30 mLs (20 g total) by mouth daily as needed for mild constipation or moderate constipation. 236 mL 0   lidocaine  (LIDODERM ) 5 % Place 1 patch onto the skin daily. Remove & Discard patch within 12 hours or as directed by MD 30 patch 0   loratadine  (CLARITIN ) 10 MG tablet TAKE 1 TABLET BY MOUTH EVERY DAY AS NEEDED FOR ALLERGY 90  tablet 1   losartan  (COZAAR ) 100 MG tablet TAKE 1 TABLET BY MOUTH EVERY DAY 90 tablet 0   nitroGLYCERIN  (NITROSTAT ) 0.4 MG SL tablet Place 1 tablet (0.4 mg total) under the tongue every 5 (five) minutes as needed. 25 tablet 11   oxyCODONE -acetaminophen  (PERCOCET) 10-325 MG tablet Take 1 tablet by mouth every 6 (six) hours.     pregabalin (LYRICA) 75 MG capsule Take 75 mg by mouth 2 (two) times daily.     propranolol  (INDERAL ) 10 MG tablet TAKE 1 TABLET BY MOUTH TWICE A DAY 180 tablet 0   No current facility-administered medications for this visit.   No results found.  Review of Systems:   A ROS was performed including pertinent positives and negatives as documented in the HPI.  Physical Exam :   Constitutional: NAD and appears stated age Neurological: Alert and oriented Psych: Appropriate affect and cooperative Last menstrual period 10/10/2019.   Comprehensive Musculoskeletal Exam:    Right shoulder with positive Neer impingement.  Active forward elevation is 170 degrees bilaterally.  External rotation is to 45 bilaterally.  Internal rotation is to L1.  Positive speeds maneuver   Imaging:   Xray (4 views right shoulder): Normal  MRI (right shoulder): Significant biceps tearing and bursal sided fraying consistent with subacromial impingement   I personally reviewed and interpreted the radiographs.   Assessment and Plan:   58 y.o. female with evidence of right shoulder rotator cuff impingement  which has now failed an injection as well as physical therapy.  Given the fact that she has trialed conservative measures without relief we did discuss the possibility of right shoulder arthroscopy with acromioplasty and biceps tenodesis.  I discussed the risks and benefits associated with this.  I did discuss associated recovery timeframe.  After discussion she would like to proceed with this  -Plan for right shoulder arthroscopy with biceps tenodesis and acromioplasty   After a lengthy discussion of treatment options, including risks, benefits, alternatives, complications of surgical and nonsurgical conservative options, the patient elected surgical repair.   The patient  is aware of the material risks  and complications including, but not limited to injury to adjacent structures, neurovascular injury, infection, numbness, bleeding, implant failure, thermal burns, stiffness, persistent pain, failure to heal, disease transmission from allograft, need for further surgery, dislocation, anesthetic risks, blood clots, risks of death,and others. The probabilities of surgical success and failure discussed with patient given their particular co-morbidities.The time and nature of expected rehabilitation and recovery was discussed.The patient's questions were all answered preoperatively.  No barriers to understanding were noted. I explained the natural history of the disease process and Rx rationale.  I explained to the patient what I considered to be reasonable expectations given their personal situation.  The final treatment plan was arrived at through a shared patient decision making process model.    I personally saw and evaluated the patient, and participated in the management and treatment plan.  Wilhelmenia Harada, MD Attending Physician, Orthopedic Surgery  This document was dictated using Dragon voice recognition software. A reasonable attempt at proof reading has been made to minimize errors.

## 2024-05-14 ENCOUNTER — Emergency Department (HOSPITAL_BASED_OUTPATIENT_CLINIC_OR_DEPARTMENT_OTHER)

## 2024-05-14 ENCOUNTER — Emergency Department (HOSPITAL_BASED_OUTPATIENT_CLINIC_OR_DEPARTMENT_OTHER)
Admission: EM | Admit: 2024-05-14 | Discharge: 2024-05-14 | Disposition: A | Discharge status: Home / Self Care | Attending: Emergency Medicine | Admitting: Emergency Medicine

## 2024-05-14 ENCOUNTER — Encounter (HOSPITAL_BASED_OUTPATIENT_CLINIC_OR_DEPARTMENT_OTHER): Payer: Self-pay | Admitting: Urology

## 2024-05-14 DIAGNOSIS — Z7982 Long term (current) use of aspirin: Secondary | ICD-10-CM | POA: Diagnosis not present

## 2024-05-14 DIAGNOSIS — M5441 Lumbago with sciatica, right side: Secondary | ICD-10-CM | POA: Insufficient documentation

## 2024-05-14 DIAGNOSIS — M549 Dorsalgia, unspecified: Secondary | ICD-10-CM | POA: Diagnosis present

## 2024-05-14 DIAGNOSIS — M5431 Sciatica, right side: Secondary | ICD-10-CM

## 2024-05-14 LAB — BASIC METABOLIC PANEL WITH GFR
Anion gap: 11 (ref 5–15)
BUN: 12 mg/dL (ref 6–20)
CO2: 27 mmol/L (ref 22–32)
Calcium: 8.9 mg/dL (ref 8.9–10.3)
Chloride: 101 mmol/L (ref 98–111)
Creatinine, Ser: 1.03 mg/dL — ABNORMAL HIGH (ref 0.44–1.00)
GFR, Estimated: >60 mL/min (ref >60)
Glucose, Bld: 122 mg/dL — ABNORMAL HIGH (ref 70–99)
Potassium: 3.8 mmol/L (ref 3.5–5.1)
Sodium: 138 mmol/L (ref 135–145)

## 2024-05-14 LAB — CBC WITH DIFFERENTIAL/PLATELET
Abs Immature Granulocytes: 0.07 10*3/uL (ref 0.00–0.07)
Basophils Absolute: 0.1 10*3/uL (ref 0.0–0.1)
Basophils Relative: 1 %
Eosinophils Absolute: 0.2 10*3/uL (ref 0.0–0.5)
Eosinophils Relative: 1 %
HCT: 45 % (ref 36.0–46.0)
Hemoglobin: 15.4 g/dL — ABNORMAL HIGH (ref 12.0–15.0)
Immature Granulocytes: 1 %
Lymphocytes Relative: 23 %
Lymphs Abs: 3 10*3/uL (ref 0.7–4.0)
MCH: 31 pg (ref 26.0–34.0)
MCHC: 34.2 g/dL (ref 30.0–36.0)
MCV: 90.7 fL (ref 80.0–100.0)
Monocytes Absolute: 0.6 10*3/uL (ref 0.1–1.0)
Monocytes Relative: 5 %
Neutro Abs: 9.3 10*3/uL — ABNORMAL HIGH (ref 1.7–7.7)
Neutrophils Relative %: 69 %
Platelets: 318 10*3/uL (ref 150–400)
RBC: 4.96 MIL/uL (ref 3.87–5.11)
RDW: 13.3 % (ref 11.5–15.5)
WBC: 13.2 10*3/uL — ABNORMAL HIGH (ref 4.0–10.5)
nRBC: 0 % (ref 0.0–0.2)

## 2024-05-14 MED ORDER — KETOROLAC TROMETHAMINE 30 MG/ML IJ SOLN
30.0000 mg | Freq: Once | INTRAMUSCULAR | Status: AC
  Administered 2024-05-14: 30 mg via INTRAVENOUS
  Filled 2024-05-14: qty 1

## 2024-05-14 MED ORDER — HYDROMORPHONE HCL 1 MG/ML IJ SOLN
1.0000 mg | Freq: Once | INTRAMUSCULAR | Status: AC
  Administered 2024-05-14: 1 mg via INTRAVENOUS
  Filled 2024-05-14: qty 1

## 2024-05-14 MED ORDER — DIAZEPAM 5 MG/ML IJ SOLN
5.0000 mg | Freq: Once | INTRAMUSCULAR | Status: AC
  Administered 2024-05-14: 5 mg via INTRAVENOUS
  Filled 2024-05-14: qty 2

## 2024-05-14 MED ORDER — METHYLPREDNISOLONE SODIUM SUCC 125 MG IJ SOLR
125.0000 mg | Freq: Once | INTRAMUSCULAR | Status: AC
  Administered 2024-05-14: 125 mg via INTRAVENOUS
  Filled 2024-05-14: qty 2

## 2024-05-14 MED ORDER — DIAZEPAM 5 MG PO TABS: 5.0000 mg | ORAL_TABLET | Freq: Four times a day (QID) | ORAL | 0 refills | Status: DC | PRN

## 2024-05-14 MED ORDER — PREDNISONE 20 MG PO TABS: ORAL_TABLET | ORAL | 0 refills | Status: DC

## 2024-05-14 NOTE — ED Notes (Signed)
 Discharge instructions reviewed with patient. Patient verbalizes understanding, no further questions at this time. Medications/prescriptions and follow up information provided. No acute distress noted at time of departure.

## 2024-05-14 NOTE — Discharge Instructions (Addendum)
 You likely have sciatica  I recommend you continue taking your pain meds as prescribed  I have prescribed prednisone    I have also prescribed Valium 5 mg every 6 hours as needed.  You need to call your pain management doctor tomorrow and let him know about the new prescription.  Please also follow-up with your spine doctor  Return to the ER if you have worse back pain or numbness or weakness

## 2024-05-14 NOTE — ED Triage Notes (Signed)
 Pt states sciatic nerve pain on right side lower back radiating down to right calf x month   H/o back surgery

## 2024-05-14 NOTE — ED Provider Notes (Signed)
 [place] EMERGENCY DEPARTMENT AT [facility] Provider Note   CSN: [id number] Arrival date & time: 05/14/24  1521     History  Chief Complaint  Patient presents with   Back Pain    Sarah Randall is a 58 y.o. female history of chronic pain on chronic pain medicine, multiple orthopedic surgeries, here presenting with back pain and bilateral leg swelling.  Patient had previous lumbar surgery done by Dr. Andy Bannister.  She states that she is requesting a second opinion with a different orthopedic doctor.  Patient states that she has back pain for the last month or so.  She states that it radiated down to the right leg.  Patient had seen doctors Xu for her knee pain and received a steroid injection recently.  Patient also follows up with Dr. Hermina Loosen regarding her shoulder impingement syndrome.  Finally, patient does follow-up with pain management and is prescribed oxycodone  and also pregabalin.  She states that her pregabalin was increased recently due to her pain but she states that the pain is not under control still.  Patient denies trouble urinating.  Patient states that the pain travels down from her back into her right leg.  The history is provided by the patient.       Home Medications Prior to Admission medications   Medication Sig Start Date End Date Taking? Authorizing Provider  acetaminophen  (TYLENOL ) 500 MG tablet Take 1 tablet (500 mg total) by mouth every 8 (eight) hours for 10 days. 05/10/24 05/20/24  Wilhelmenia Harada, MD  aspirin  EC 325 MG tablet Take 1 tablet (325 mg total) by mouth daily. 05/10/24   Wilhelmenia Harada, MD  Cyanocobalamin  (B-12) 5000 MCG CAPS Take 5,000 mcg by mouth every other day.    [provider]  diclofenac  Sodium (VOLTAREN ) 1 % GEL Apply 2 g topically 4 (four) times daily. 01/08/22   Lawrance Presume, MD  esomeprazole  (NEXIUM ) 40 MG capsule TAKE 1 CAPSULE BY MOUTH EVERY DAY 05/27/22   Lawrance Presume, MD  hydrochlorothiazide  (HYDRODIURIL ) 25  MG tablet TAKE 1 TABLET BY MOUTH EVERY DAY 07/01/22   Lawrance Presume, MD  HYDROcodone -acetaminophen  (NORCO) 5-325 MG tablet Take 1 tablet by mouth every 6 (six) hours as needed. 08/19/22   Wes Hamman, MD  ipratropium (ATROVENT ) 0.03 % nasal spray Place 2 sprays into both nostrils every 12 (twelve) hours. 08/15/22   Jerilynn Montenegro, MD  lactulose  (CHRONULAC ) 10 GM/15ML solution Take 30 mLs (20 g total) by mouth daily as needed for mild constipation or moderate constipation. 07/15/23   Long, Shereen Dike, MD  lidocaine  (LIDODERM ) 5 % Place 1 patch onto the skin daily. Remove & Discard patch within 12 hours or as directed by MD 06/28/23   Denese Finn, PA-C  loratadine  (CLARITIN ) 10 MG tablet TAKE 1 TABLET BY MOUTH EVERY DAY AS NEEDED FOR ALLERGY 08/14/21   Lawrance Presume, MD  losartan  (COZAAR ) 100 MG tablet TAKE 1 TABLET BY MOUTH EVERY DAY 07/02/22   Lawrance Presume, MD  nitroGLYCERIN  (NITROSTAT ) 0.4 MG SL tablet Place 1 tablet (0.4 mg total) under the tongue every 5 (five) minutes as needed. 10/21/18 07/17/22  Revankar, Micael Adas, MD  oxyCODONE -acetaminophen  (PERCOCET) 10-325 MG tablet Take 1 tablet by mouth every 6 (six) hours. 07/13/22   [provider]  pregabalin (LYRICA) 75 MG capsule Take 75 mg by mouth 2 (two) times daily. 06/12/22   [provider]  propranolol  (INDERAL ) 10 MG tablet TAKE 1 TABLET  BY MOUTH TWICE A DAY 07/01/22   Lawrance Presume, MD      Allergies    Pregabalin, Cymbalta  [duloxetine  hcl], Escitalopram , Gabapentin, Lipitor [atorvastatin ], Pravachol  [pravastatin ], Repatha  [evolocumab ], and Zoloft  [sertraline  hcl]    Review of Systems   Review of Systems  Musculoskeletal:  Positive for back pain.  All other systems reviewed and are negative.   Physical Exam Updated Vital Signs BP (!) 113/90 (BP Location: Right Arm)   Pulse 83   Temp 98.1 F (36.7 C) (Oral)   Resp 18   Ht 5\' 4"  (1.626 m)   Wt 134 kg   LMP 10/10/2019   SpO2 99%   BMI 50.71 kg/m   Physical Exam Vitals and nursing note reviewed.  Constitutional:      Comments: Uncomfortable  HENT:     Head: Normocephalic.     Nose: Nose normal.     Mouth/Throat:     Mouth: Mucous membranes are moist.  Eyes:     Extraocular Movements: Extraocular movements intact.     Pupils: Pupils are equal, round, and reactive to light.  Cardiovascular:     Rate and Rhythm: Normal rate and regular rhythm.     Pulses: Normal pulses.  Pulmonary:     Effort: Pulmonary effort is normal.     Breath sounds: Normal breath sounds.  Abdominal:     General: Abdomen is flat.     Palpations: Abdomen is soft.  Musculoskeletal:     Cervical back: Normal range of motion and neck supple.     Comments: R paralumbar tenderness. 1+ edema bilateral legs.  Bilateral calf tenderness.  Skin:    General: Skin is warm.     Capillary Refill: Capillary refill takes less than 2 seconds.  Neurological:     General: No focal deficit present.     Comments: No saddle anesthesia.  Patient has positive straight leg raise on the right.  Patient has normal reflexes bilateral knees.  Psychiatric:        Mood and Affect: Mood normal.        Behavior: Behavior normal.     ED Results / Procedures / Treatments   Labs (all labs ordered are listed, but only abnormal results are displayed) Labs Reviewed  CBC WITH DIFFERENTIAL/PLATELET - Abnormal; Notable for the following components:      Result Value   WBC 13.2 (*)    Hemoglobin 15.4 (*)    Neutro Abs 9.3 (*)    All other components within normal limits  BASIC METABOLIC PANEL WITH GFR    EKG None  Radiology US  Venous Img Lower Bilateral Result Date: 05/14/2024 CLINICAL DATA:  Bilateral leg swelling. EXAM: BILATERAL LOWER EXTREMITY VENOUS DOPPLER ULTRASOUND TECHNIQUE: Gray-scale sonography with graded compression, as well as color Doppler and duplex ultrasound were performed to evaluate the lower extremity deep venous systems from the level of the common femoral  vein and including the common femoral, femoral, profunda femoral, popliteal and calf veins including the posterior tibial, peroneal and gastrocnemius veins when visible. The superficial great saphenous vein was also interrogated. Spectral Doppler was utilized to evaluate flow at rest and with distal augmentation maneuvers in the common femoral, femoral and popliteal veins. COMPARISON:  None Available. FINDINGS: RIGHT LOWER EXTREMITY Common Femoral Vein: No evidence of thrombus. Normal compressibility, respiratory phasicity and response to augmentation. Saphenofemoral Junction: No evidence of thrombus. Normal compressibility and flow on color Doppler imaging. Profunda Femoral Vein: No evidence of thrombus. Normal compressibility and flow on  color Doppler imaging. Femoral Vein: No evidence of thrombus. Normal compressibility, respiratory phasicity and response to augmentation. Popliteal Vein: No evidence of thrombus. Normal compressibility, respiratory phasicity and response to augmentation. Calf Veins: No evidence of thrombus. Normal compressibility and flow on color Doppler imaging. Peroneal vein not visualized. Superficial Great Saphenous Vein: No evidence of thrombus. Normal compressibility. Venous Reflux:  None. Other Findings:  None. LEFT LOWER EXTREMITY Common Femoral Vein: No evidence of thrombus. Normal compressibility, respiratory phasicity and response to augmentation. Saphenofemoral Junction: No evidence of thrombus. Normal compressibility and flow on color Doppler imaging. Profunda Femoral Vein: No evidence of thrombus. Normal compressibility and flow on color Doppler imaging. Femoral Vein: No evidence of thrombus. Normal compressibility, respiratory phasicity and response to augmentation. Popliteal Vein: No evidence of thrombus. Normal compressibility, respiratory phasicity and response to augmentation. Calf Veins: No evidence of thrombus. Normal compressibility and flow on color Doppler imaging.  Peroneal vein not visualized. Superficial Great Saphenous Vein: No evidence of thrombus. Normal compressibility. Venous Reflux:  None. Other Findings: There is a left-sided popliteal cyst measuring 7.7 x 3.5 x 2.0 cm. Subcutaneous bilateral calf edema noted. IMPRESSION: 1. No evidence of deep venous thrombosis in either lower extremity. 2. Left popliteal cyst. 3. Bilateral calf edema. Electronically Signed   By: Sarah Eva M.D.   On: 05/14/2024 17:12   DG Lumbar Spine Complete Result Date: 05/14/2024 CLINICAL DATA:  Chronic low back and right leg pain. History of prior lumbar fusion. EXAM: LUMBAR SPINE - COMPLETE 4+ VIEW COMPARISON:  Lumbar radiographs 01/27/2023 FINDINGS: Stable fusion hardware at L4-5. No complicating features are identified. Normal and stable alignment of the lumbar vertebral bodies. No acute bony findings. The SI joints are maintained. Left hip prosthesis noted. IMPRESSION: 1. Stable fusion hardware at L4-5. 2. No acute bony findings. Electronically Signed   By: Sarah Siva M.D.   On: 05/14/2024 15:57    Procedures Procedures    Angiocath insertion Performed by: Sarah Randall  Consent: Verbal consent obtained. Risks and benefits: risks, benefits and alternatives were discussed Time out: Immediately prior to procedure a "time out" was called to verify the correct patient, procedure, equipment, support staff and site/side marked as required.  Preparation: Patient was prepped and draped in the usual sterile fashion.  Vein Location: R forearm   Ultrasound Guided  Gauge: 20 long   Normal blood return and flush without difficulty Patient tolerance: Patient tolerated the procedure well with no immediate complications.    Medications Ordered in ED Medications  methylPREDNISolone  sodium succinate (SOLU-MEDROL ) 125 mg/2 mL injection 125 mg (125 mg Intravenous Given 05/14/24 1727)  HYDROmorphone  (DILAUDID ) injection 1 mg (1 mg Intravenous Given 05/14/24 1728)  diazepam  (VALIUM) injection 5 mg (5 mg Intravenous Given 05/14/24 1724)  ketorolac  (TORADOL ) 30 MG/ML injection 30 mg (30 mg Intravenous Given 05/14/24 1726)    ED Course/ Medical Decision Making/ A&P                                 Medical Decision Making EVOLETT SOMARRIBA is a 58 y.o. female here presenting with back pain.  This is an ongoing issue going on for about a month.  Patient has no saddle anesthesia.  No neurodeficits that will require MRI.  Patient does have bilateral leg swelling and also calf tenderness.  Consider DVT as well.  Will get bilateral DVT study and also check kidney function.  Will give pain  medicine and steroids and anti-inflammatories and muscle relaxant and reassess.  6:38 PM I reviewed patient's labs and white blood cell count is 13 but there is no infectious symptoms.  Creatinine is normal.  DVT study showed left popliteal cysts and diffuse edema but no DVT.  Lumbar x-ray showed stable fusion at L4 and 5.  Patient is feeling better after pain medicine and muscle relaxant.  Patient has a pain management doctor and I told her that I cannot prescribe any pain medicine.  She states that it is okay for me to prescribe muscle relaxant.  I will also prescribe prednisone  taper.  I told her to call her pain management doctor tomorrow  Problems Addressed: Sciatica of right side: acute illness or injury  Amount and/or Complexity of Data Reviewed Labs: ordered. Decision-making details documented in ED Course. Radiology: ordered and independent interpretation performed. Decision-making details documented in ED Course.  Risk Prescription drug management.    Final Clinical Impression(s) / ED Diagnoses Final diagnoses:  None    Rx / DC Orders ED Discharge Orders     None         Dalene Duck, MD 05/14/24 [phone]

## 2024-06-08 ENCOUNTER — Ambulatory Visit: Admitting: Orthopedic Surgery

## 2024-06-21 ENCOUNTER — Ambulatory Visit (INDEPENDENT_AMBULATORY_CARE_PROVIDER_SITE_OTHER): Admitting: Orthopedic Surgery

## 2024-06-21 ENCOUNTER — Other Ambulatory Visit (INDEPENDENT_AMBULATORY_CARE_PROVIDER_SITE_OTHER)

## 2024-06-21 VITALS — BP 99/68 | HR 102 | Ht 64.0 in | Wt 295.0 lb

## 2024-06-21 DIAGNOSIS — M5416 Radiculopathy, lumbar region: Secondary | ICD-10-CM | POA: Diagnosis not present

## 2024-06-21 DIAGNOSIS — M545 Low back pain, unspecified: Secondary | ICD-10-CM

## 2024-06-21 NOTE — Progress Notes (Signed)
 Orthopedic Spine Surgery Office Note  Assessment: Patient is a 58 y.o. female with low back pain that radiates into the lateral aspect of the right thigh and leg, suspect radiculopathy   Plan: -Explained that initially conservative treatment is tried as a significant number of patients may experience relief with these treatment modalities. Discussed that the conservative treatments include:  -activity modification  -physical therapy  -over the counter pain medications  -medrol  dosepak  -lumbar steroid injections -Patient has tried  tylenol , aleve , oral steroids, pregabalin, oxycodone  -Patient has tried over 3 months of conservative treatments with her pain management doctor without any relief, so recommended MRI of the lumbar spine to evaluate for radiculopathy  -Continue with pain management for pain control -Would need to be nicotine  free prior to any elective spine surgery -Patient should return to office in 4 weeks, x-rays at next visit: none   Patient expressed understanding of the plan and all questions were answered to the patient's satisfaction.   ___________________________________________________________________________   History:  Patient is a 58 y.o. female who presents today for lumbar spine.  Patient underwent L4/5 OLIF and posterior spinal instrumentation with Dr. Debby.  She subsequently had persistent pain and underwent revision of her posterior instrumentation due to a medial breach of one of the screws.  After that revision surgery in 2022, she had been doing well.  She then states for the last year she developed low back pain radiating into the right lower extremity.  The majority of her pain is in the leg.  There is no trauma or injury that preceded the onset of the pain.  The pain has gotten progressively worse with time.  She notes the pain with activity and at rest.   Weakness: yes, right leg feels weaker at times Symptoms of imbalance: denies Paresthesias and  numbness: denies Bowel or bladder incontinence: denies Saddle anesthesia: denies  Treatments tried: tylenol , aleve , oral steroids, pregabalin, oxycodone   Review of systems: Denies fevers and chills, night sweats, unexplained weight loss, history of cancer. Has had pain that wakes her at night  Past medical history: HLD HTN GERD DM OSA Chronic pain Depression  Allergies: pregabalin, cymbalta , escitalopram , gabapentin, lipitor, pravachol , repatha , zoloft   Past surgical history:  L4/5 OLIF and PSIF Revision L4/5 instrumentation Hysterectomy Left knee medial meniscectomy Left knee synovectomy Right TKA Left THA  Social history: Reports use of nicotine  product (smoking, vaping, patches, smokeless) Alcohol use: denies Denies recreational drug use   Physical Exam:  General: no acute distress, appears stated age Neurologic: alert, answering questions appropriately, following commands Respiratory: unlabored breathing on room air, symmetric chest rise Psychiatric: appropriate affect, normal cadence to speech   MSK (spine):  -Strength exam      Left  Right EHL    5/5  5/5 TA    5/5  5/5 GSC    5/5  5/5 Knee extension  5/5  5/5 Hip flexion   5/5  5/5  -Sensory exam    Sensation intact to light touch in L3-S1 nerve distributions of bilateral lower extremities  -Achilles DTR: 1/4 on the left, 1/4 on the right -Patellar tendon DTR: 1/4 on the left, 1/4 on the right  -Straight leg raise: negative bilaterally -Clonus: no beats bilaterally  -Left hip exam: no pain through range of motion -Right hip exam: no pain through range of motion  Imaging: XRs of the lumbar spine from 06/21/2024 were independently reviewed and interpreted, showing interbody device at L4/5 that appears in appropriate position. Posterior instrumentation  at L4 and L5 without any lucency around the screws. Disc height loss at L3/4 and L5/S1. No fracture or dislocation seen.     Patient name:  Sarah Randall Patient MRN: [id number] Date of visit: 06/21/24   I spent in the room with the patient going over her history and what happened with her previous spine surgery. We also went over smoking cessation and some of her other risk factors that put her at increased risk for complication with surgical intervention. I advised weight loss and smoking cessation. She is going to work on both. We also spent some time discussing treatments he has tried thus far and treatments that we would have available that would be different than what she has previously tried.

## 2024-06-23 ENCOUNTER — Ambulatory Visit (HOSPITAL_BASED_OUTPATIENT_CLINIC_OR_DEPARTMENT_OTHER)
Admission: RE | Admit: 2024-06-23 | Discharge: 2024-06-23 | Disposition: A | Discharge status: Home / Self Care | Source: Ambulatory Visit | Attending: Orthopedic Surgery | Admitting: Orthopedic Surgery

## 2024-06-23 DIAGNOSIS — M5416 Radiculopathy, lumbar region: Secondary | ICD-10-CM | POA: Diagnosis present

## 2024-07-06 ENCOUNTER — Encounter (HOSPITAL_BASED_OUTPATIENT_CLINIC_OR_DEPARTMENT_OTHER)
Admission: RE | Admit: 2024-07-06 | Discharge: 2024-07-06 | Disposition: A | Discharge status: Home / Self Care | Source: Ambulatory Visit | Attending: Orthopaedic Surgery | Admitting: Orthopaedic Surgery

## 2024-07-06 DIAGNOSIS — Z01812 Encounter for preprocedural laboratory examination: Secondary | ICD-10-CM | POA: Diagnosis present

## 2024-07-06 LAB — BASIC METABOLIC PANEL WITH GFR
Anion gap: 10 (ref 5–15)
BUN: 12 mg/dL (ref 6–20)
CO2: 24 mmol/L (ref 22–32)
Calcium: 8.9 mg/dL (ref 8.9–10.3)
Chloride: 103 mmol/L (ref 98–111)
Creatinine, Ser: 0.99 mg/dL (ref 0.44–1.00)
GFR, Estimated: >60 mL/min (ref >60)
Glucose, Bld: 95 mg/dL (ref 70–99)
Potassium: 3.3 mmol/L — ABNORMAL LOW (ref 3.5–5.1)
Sodium: 137 mmol/L (ref 135–145)

## 2024-07-06 NOTE — Progress Notes (Signed)

## 2024-07-10 ENCOUNTER — Encounter (HOSPITAL_BASED_OUTPATIENT_CLINIC_OR_DEPARTMENT_OTHER)
Admission: RE | Disposition: A | Discharge status: Home / Self Care | Payer: Self-pay | Source: Home / Self Care | Attending: Orthopaedic Surgery

## 2024-07-10 ENCOUNTER — Ambulatory Visit (HOSPITAL_BASED_OUTPATIENT_CLINIC_OR_DEPARTMENT_OTHER): Admitting: Anesthesiology

## 2024-07-10 ENCOUNTER — Ambulatory Visit (HOSPITAL_BASED_OUTPATIENT_CLINIC_OR_DEPARTMENT_OTHER)
Admission: RE | Admit: 2024-07-10 | Discharge: 2024-07-10 | Disposition: A | Discharge status: Home / Self Care | Attending: Orthopaedic Surgery | Admitting: Orthopaedic Surgery

## 2024-07-10 ENCOUNTER — Encounter (HOSPITAL_BASED_OUTPATIENT_CLINIC_OR_DEPARTMENT_OTHER): Payer: Self-pay | Admitting: Orthopaedic Surgery

## 2024-07-10 DIAGNOSIS — G4733 Obstructive sleep apnea (adult) (pediatric): Secondary | ICD-10-CM

## 2024-07-10 DIAGNOSIS — M7521 Bicipital tendinitis, right shoulder: Secondary | ICD-10-CM | POA: Insufficient documentation

## 2024-07-10 DIAGNOSIS — Z6841 Body Mass Index (BMI) 40.0 and over, adult: Secondary | ICD-10-CM

## 2024-07-10 DIAGNOSIS — Z791 Long term (current) use of non-steroidal anti-inflammatories (NSAID): Secondary | ICD-10-CM | POA: Diagnosis not present

## 2024-07-10 DIAGNOSIS — K219 Gastro-esophageal reflux disease without esophagitis: Secondary | ICD-10-CM | POA: Insufficient documentation

## 2024-07-10 DIAGNOSIS — Z7982 Long term (current) use of aspirin: Secondary | ICD-10-CM | POA: Diagnosis not present

## 2024-07-10 DIAGNOSIS — Z833 Family history of diabetes mellitus: Secondary | ICD-10-CM | POA: Diagnosis not present

## 2024-07-10 DIAGNOSIS — Z7984 Long term (current) use of oral hypoglycemic drugs: Secondary | ICD-10-CM | POA: Insufficient documentation

## 2024-07-10 DIAGNOSIS — M65919 Unspecified synovitis and tenosynovitis, unspecified shoulder: Secondary | ICD-10-CM | POA: Diagnosis not present

## 2024-07-10 DIAGNOSIS — Z79899 Other long term (current) drug therapy: Secondary | ICD-10-CM | POA: Insufficient documentation

## 2024-07-10 DIAGNOSIS — F32A Depression, unspecified: Secondary | ICD-10-CM | POA: Insufficient documentation

## 2024-07-10 DIAGNOSIS — I10 Essential (primary) hypertension: Secondary | ICD-10-CM | POA: Insufficient documentation

## 2024-07-10 DIAGNOSIS — F419 Anxiety disorder, unspecified: Secondary | ICD-10-CM | POA: Diagnosis not present

## 2024-07-10 DIAGNOSIS — F17210 Nicotine dependence, cigarettes, uncomplicated: Secondary | ICD-10-CM | POA: Diagnosis not present

## 2024-07-10 DIAGNOSIS — E6689 Other obesity not elsewhere classified: Secondary | ICD-10-CM | POA: Diagnosis not present

## 2024-07-10 DIAGNOSIS — M1990 Unspecified osteoarthritis, unspecified site: Secondary | ICD-10-CM | POA: Insufficient documentation

## 2024-07-10 DIAGNOSIS — G4730 Sleep apnea, unspecified: Secondary | ICD-10-CM | POA: Diagnosis not present

## 2024-07-10 DIAGNOSIS — M7541 Impingement syndrome of right shoulder: Secondary | ICD-10-CM | POA: Diagnosis not present

## 2024-07-10 DIAGNOSIS — Z01818 Encounter for other preprocedural examination: Secondary | ICD-10-CM

## 2024-07-10 DIAGNOSIS — E119 Type 2 diabetes mellitus without complications: Secondary | ICD-10-CM | POA: Insufficient documentation

## 2024-07-10 HISTORY — PX: POSTERIOR LUMBAR FUSION 2 WITH HARDWARE REMOVAL: SHX7297

## 2024-07-10 HISTORY — PX: BICEPT TENODESIS: SHX5116

## 2024-07-10 LAB — GLUCOSE, CAPILLARY
Glucose-Capillary: 107 mg/dL — ABNORMAL HIGH (ref 70–99)
Glucose-Capillary: 85 mg/dL (ref 70–99)

## 2024-07-10 SURGERY — ARTHROSCOPY, SHOULDER WITH DEBRIDEMENT
Anesthesia: General | Laterality: Right

## 2024-07-10 MED ORDER — DEXAMETHASONE SODIUM PHOSPHATE 4 MG/ML IJ SOLN
INTRAMUSCULAR | Status: DC | PRN
  Administered 2024-07-10 (×2): 5 mg via INTRAVENOUS

## 2024-07-10 MED ORDER — SODIUM CHLORIDE 0.9 % IR SOLN
Status: DC | PRN
  Administered 2024-07-10: 5000 mL

## 2024-07-10 MED ORDER — ROCURONIUM BROMIDE 10 MG/ML (PF) SYRINGE
PREFILLED_SYRINGE | INTRAVENOUS | Status: AC
  Filled 2024-07-10: qty 10

## 2024-07-10 MED ORDER — GABAPENTIN 300 MG PO CAPS: 300.0000 mg | ORAL_CAPSULE | Freq: Once | ORAL | Status: DC

## 2024-07-10 MED ORDER — ONDANSETRON HCL 4 MG/2ML IJ SOLN
INTRAMUSCULAR | Status: AC
  Filled 2024-07-10: qty 6

## 2024-07-10 MED ORDER — EPHEDRINE 5 MG/ML INJ
INTRAVENOUS | Status: AC
Start: 2024-07-10 — End: 2024-07-10
  Filled 2024-07-10: qty 5

## 2024-07-10 MED ORDER — LIDOCAINE HCL (CARDIAC) PF 100 MG/5ML IV SOSY
PREFILLED_SYRINGE | INTRAVENOUS | Status: DC | PRN
  Administered 2024-07-10: 100 mg via INTRAVENOUS

## 2024-07-10 MED ORDER — TRANEXAMIC ACID-NACL 1000-0.7 MG/100ML-% IV SOLN
INTRAVENOUS | Status: AC
  Filled 2024-07-10: qty 100

## 2024-07-10 MED ORDER — ACETAMINOPHEN 500 MG PO TABS
1000.0000 mg | ORAL_TABLET | Freq: Once | ORAL | Status: AC
  Administered 2024-07-10: 1000 mg via ORAL

## 2024-07-10 MED ORDER — ROCURONIUM BROMIDE 100 MG/10ML IV SOLN
INTRAVENOUS | Status: DC | PRN
  Administered 2024-07-10: 60 mg via INTRAVENOUS

## 2024-07-10 MED ORDER — ONDANSETRON HCL 4 MG/2ML IJ SOLN
INTRAMUSCULAR | Status: DC | PRN
  Administered 2024-07-10: 4 mg via INTRAVENOUS

## 2024-07-10 MED ORDER — GABAPENTIN 300 MG PO CAPS
ORAL_CAPSULE | ORAL | Status: AC
  Filled 2024-07-10: qty 1

## 2024-07-10 MED ORDER — FENTANYL CITRATE (PF) 100 MCG/2ML IJ SOLN
50.0000 ug | Freq: Once | INTRAMUSCULAR | Status: AC
  Administered 2024-07-10: 50 ug via INTRAVENOUS

## 2024-07-10 MED ORDER — KETOROLAC TROMETHAMINE 30 MG/ML IJ SOLN
INTRAMUSCULAR | Status: AC
  Filled 2024-07-10: qty 1

## 2024-07-10 MED ORDER — PROPOFOL 10 MG/ML IV BOLUS
INTRAVENOUS | Status: DC | PRN
  Administered 2024-07-10: 200 mg via INTRAVENOUS

## 2024-07-10 MED ORDER — SUGAMMADEX SODIUM 200 MG/2ML IV SOLN
INTRAVENOUS | Status: DC | PRN
  Administered 2024-07-10: 300 mg via INTRAVENOUS

## 2024-07-10 MED ORDER — MIDAZOLAM HCL 2 MG/2ML IJ SOLN
INTRAMUSCULAR | Status: AC
  Filled 2024-07-10: qty 2

## 2024-07-10 MED ORDER — PROPOFOL 500 MG/50ML IV EMUL
INTRAVENOUS | Status: AC
  Filled 2024-07-10: qty 100

## 2024-07-10 MED ORDER — OXYCODONE HCL 5 MG/5ML PO SOLN: 5.0000 mg | Freq: Once | ORAL | Status: DC | PRN

## 2024-07-10 MED ORDER — CEFAZOLIN SODIUM-DEXTROSE 2-4 GM/100ML-% IV SOLN
INTRAVENOUS | Status: AC
Start: 2024-07-10 — End: 2024-07-10
  Filled 2024-07-10: qty 100

## 2024-07-10 MED ORDER — DEXMEDETOMIDINE HCL IN NACL 80 MCG/20ML IV SOLN
INTRAVENOUS | Status: AC
  Filled 2024-07-10: qty 20

## 2024-07-10 MED ORDER — SUCCINYLCHOLINE CHLORIDE 200 MG/10ML IV SOSY
PREFILLED_SYRINGE | INTRAVENOUS | Status: AC
  Filled 2024-07-10: qty 30

## 2024-07-10 MED ORDER — CEFAZOLIN SODIUM-DEXTROSE 2-4 GM/100ML-% IV SOLN
2.0000 g | INTRAVENOUS | Status: AC
  Administered 2024-07-10: 3 g via INTRAVENOUS

## 2024-07-10 MED ORDER — MIDAZOLAM HCL 2 MG/2ML IJ SOLN
1.0000 mg | Freq: Once | INTRAMUSCULAR | Status: AC
  Administered 2024-07-10: 1 mg via INTRAVENOUS

## 2024-07-10 MED ORDER — EPHEDRINE SULFATE (PRESSORS) 50 MG/ML IJ SOLN
INTRAMUSCULAR | Status: DC | PRN
  Administered 2024-07-10: 10 mg via INTRAVENOUS

## 2024-07-10 MED ORDER — LACTATED RINGERS IV SOLN: INTRAVENOUS | Status: DC

## 2024-07-10 MED ORDER — FENTANYL CITRATE (PF) 100 MCG/2ML IJ SOLN
INTRAMUSCULAR | Status: DC | PRN
  Administered 2024-07-10: 50 ug via INTRAVENOUS

## 2024-07-10 MED ORDER — FENTANYL CITRATE (PF) 100 MCG/2ML IJ SOLN: 25.0000 ug | INTRAMUSCULAR | Status: DC | PRN

## 2024-07-10 MED ORDER — OXYCODONE HCL 5 MG PO TABS: 5.0000 mg | ORAL_TABLET | Freq: Once | ORAL | Status: DC | PRN

## 2024-07-10 MED ORDER — DROPERIDOL 2.5 MG/ML IJ SOLN: 0.6250 mg | Freq: Once | INTRAMUSCULAR | Status: DC | PRN

## 2024-07-10 MED ORDER — BUPIVACAINE-EPINEPHRINE (PF) 0.5% -1:200000 IJ SOLN
INTRAMUSCULAR | Status: DC | PRN
  Administered 2024-07-10: 15 mL via PERINEURAL

## 2024-07-10 MED ORDER — DEXAMETHASONE SODIUM PHOSPHATE 10 MG/ML IJ SOLN
INTRAMUSCULAR | Status: AC
  Filled 2024-07-10: qty 3

## 2024-07-10 MED ORDER — FENTANYL CITRATE (PF) 100 MCG/2ML IJ SOLN
INTRAMUSCULAR | Status: AC
Start: 2024-07-10 — End: 2024-07-10
  Filled 2024-07-10: qty 2

## 2024-07-10 MED ORDER — CEFAZOLIN SODIUM-DEXTROSE 3-4 GM/150ML-% IV SOLN
INTRAVENOUS | Status: AC
Start: 2024-07-10 — End: 2024-07-10
  Filled 2024-07-10: qty 150

## 2024-07-10 MED ORDER — PHENYLEPHRINE HCL (PRESSORS) 10 MG/ML IV SOLN
INTRAVENOUS | Status: DC | PRN
  Administered 2024-07-10 (×2): 160 ug via INTRAVENOUS

## 2024-07-10 MED ORDER — BUPIVACAINE LIPOSOME 1.3 % IJ SUSP
INTRAMUSCULAR | Status: DC | PRN
Start: 2024-07-10 — End: 2024-07-10
  Administered 2024-07-10: 10 mL via PERINEURAL

## 2024-07-10 MED ORDER — ACETAMINOPHEN 500 MG PO TABS
ORAL_TABLET | ORAL | Status: AC
  Filled 2024-07-10: qty 2

## 2024-07-10 MED ORDER — TRANEXAMIC ACID-NACL 1000-0.7 MG/100ML-% IV SOLN
1000.0000 mg | INTRAVENOUS | Status: AC
  Administered 2024-07-10: 1000 mg via INTRAVENOUS

## 2024-07-10 SURGICAL SUPPLY — 63 items
ANCHOR QUATTRO LINK KNTLS 2.9 (Anchor) IMPLANT
BENZOIN TINCTURE PRP APPL 2/3 (GAUZE/BANDAGES/DRESSINGS) IMPLANT
BLADE EXCALIBUR 4.0X13 (MISCELLANEOUS) ×1 IMPLANT
BLADE SURG 15 STRL LF DISP TIS (BLADE) IMPLANT
BURR OVAL 8 FLU 4.0X13 (MISCELLANEOUS) ×1 IMPLANT
CANN ARTH HYDRODAM HIP 8.5X75 (CANNULA) IMPLANT
CANNULA 5.75X71 LONG (CANNULA) IMPLANT
CANNULA PASSPORT 5 (CANNULA) IMPLANT
CANNULA PASSPORT BUTTON 10-40 (CANNULA) IMPLANT
CANNULA TWIST IN 8.25X7CM (CANNULA) IMPLANT
CHLORAPREP W/TINT 26 (MISCELLANEOUS) ×2 IMPLANT
CLSR STERI-STRIP ANTIMIC 1/2X4 (GAUZE/BANDAGES/DRESSINGS) IMPLANT
COOLER ICEMAN CLASSIC (MISCELLANEOUS) ×1 IMPLANT
DRAPE IMP U-DRAPE 54X76 (DRAPES) ×1 IMPLANT
DRAPE INCISE IOBAN 66X45 STRL (DRAPES) ×1 IMPLANT
DRAPE SHOULDER BEACH CHAIR (DRAPES) ×1 IMPLANT
DRAPE U-SHAPE 47X51 STRL (DRAPES) ×2 IMPLANT
DW OUTFLOW CASSETTE/TUBE SET (MISCELLANEOUS) ×1 IMPLANT
ELECTRODE REM PT RTRN 9FT ADLT (ELECTROSURGICAL) ×1 IMPLANT
GAUZE PAD ABD 8X10 STRL (GAUZE/BANDAGES/DRESSINGS) ×1 IMPLANT
GAUZE SPONGE 4X4 12PLY STRL (GAUZE/BANDAGES/DRESSINGS) ×1 IMPLANT
GAUZE XEROFORM 1X8 LF (GAUZE/BANDAGES/DRESSINGS) ×1 IMPLANT
GLOVE BIO SURGEON STRL SZ 6 (GLOVE) ×2 IMPLANT
GLOVE BIO SURGEON STRL SZ7.5 (GLOVE) ×2 IMPLANT
GLOVE BIOGEL PI IND STRL 6.5 (GLOVE) ×1 IMPLANT
GLOVE BIOGEL PI IND STRL 8 (GLOVE) ×1 IMPLANT
GOWN STRL REUS W/ TWL LRG LVL3 (GOWN DISPOSABLE) ×2 IMPLANT
GOWN STRL REUS W/ TWL XL LVL3 (GOWN DISPOSABLE) ×1 IMPLANT
GOWN STRL REUS W/TWL XL LVL3 (GOWN DISPOSABLE) ×1 IMPLANT
KIT STABILIZATION SHOULDER (MISCELLANEOUS) ×1 IMPLANT
KIT STR SPEAR 1.8 FBRTK DISP (KITS) IMPLANT
LASSO 90 CVE QUICKPAS (DISPOSABLE) IMPLANT
LASSO CRESCENT QUICKPASS (SUTURE) IMPLANT
LOOP 2 FIBERLINK CLOSED (SUTURE) IMPLANT
MANIFOLD NEPTUNE II (INSTRUMENTS) ×1 IMPLANT
NDL HD SCORPION MEGA LOADER (NEEDLE) IMPLANT
NDL SUT PASSER RTC (NEEDLE) IMPLANT
NEEDLE SUT PASSER RTC (NEEDLE) IMPLANT
PACK ARTHROSCOPY DSU (CUSTOM PROCEDURE TRAY) ×1 IMPLANT
PACK BASIN DAY SURGERY FS (CUSTOM PROCEDURE TRAY) ×1 IMPLANT
PAD COLD SHLDR WRAP-ON (PAD) ×1 IMPLANT
PENCIL SMOKE EVACUATOR (MISCELLANEOUS) IMPLANT
RESTRAINT HEAD UNIVERSAL NS (MISCELLANEOUS) ×1 IMPLANT
SHEET MEDIUM DRAPE 40X70 STRL (DRAPES) ×1 IMPLANT
SLEEVE SCD COMPRESS KNEE MED (STOCKING) ×1 IMPLANT
SPONGE T-LAP 4X18 ~~LOC~~+RFID (SPONGE) ×1 IMPLANT
SUT BROADBAND TAPE 2PK 1.5 (SUTURE) IMPLANT
SUT ETHILON 3 0 PS 1 (SUTURE) ×1 IMPLANT
SUT MON AB 4-0 PS1 27 (SUTURE) IMPLANT
SUT PDS AB 1 CT 36 (SUTURE) IMPLANT
SUT TIGER TAPE 7 IN WHITE (SUTURE) IMPLANT
SUT VIC AB 0 CT1 27XBRD ANBCTR (SUTURE) IMPLANT
SUT VIC AB 0 CT2 27 (SUTURE) IMPLANT
SUT VIC AB 2-0 SH 27XBRD (SUTURE) IMPLANT
SUTURE FIBERWR #2 38 T-5 BLUE (SUTURE) IMPLANT
SUTURE TAPE 1.3 40 TPR END (SUTURE) IMPLANT
SUTURE TAPE TIGERLINK 1.3MM BL (SUTURE) IMPLANT
TAPE FIBER 2MM 7IN #2 BLUE (SUTURE) IMPLANT
TOWEL GREEN STERILE FF (TOWEL DISPOSABLE) ×2 IMPLANT
TUBE CONNECTING 20X1/4 (TUBING) ×1 IMPLANT
TUBING ARTHROSCOPY IRRIG 16FT (MISCELLANEOUS) ×1 IMPLANT
WAND ABLATOR APOLLO I90 (BUR) ×1 IMPLANT
YANKAUER SUCT BULB TIP NO VENT (SUCTIONS) IMPLANT

## 2024-07-10 NOTE — Anesthesia Postprocedure Evaluation (Signed)
 Anesthesia Post Note  Patient: Sarah Randall  Procedure(s) Performed: ARTHROSCOPY, SHOULDER WITH DEBRIDEMENT (Right) TENODESIS, BICEPS (Right)     Patient location during evaluation: PACU Anesthesia Type: General Level of consciousness: awake and alert Pain management: pain level controlled Vital Signs Assessment: post-procedure vital signs reviewed and stable Respiratory status: spontaneous breathing, nonlabored ventilation and respiratory function stable Cardiovascular status: blood pressure returned to baseline Postop Assessment: no apparent nausea or vomiting Anesthetic complications: no   No notable events documented.  Last Vitals:  Vitals:   07/10/24 1330 07/10/24 1345  BP: 132/81 139/89  Pulse: 85   Resp: 17 18  Temp:  (!) 36.1 C  SpO2: 94% 95%    Last Pain:  Vitals:   07/10/24 1345  TempSrc:   PainSc: 0-No pain                 Vertell Row

## 2024-07-10 NOTE — Anesthesia Procedure Notes (Signed)
 Anesthesia Regional Block: Interscalene brachial plexus block   Pre-Anesthetic Checklist: , timeout performed,  Correct Patient, Correct Site, Correct Laterality,  Correct Procedure, Correct Position, site marked,  Risks and benefits discussed,  Pre-op evaluation,  At surgeon's request and post-op pain management  Laterality: Right  Prep: Maximum Sterile Barrier Precautions used, chloraprep       Needles:  Injection technique: Single-shot  Needle Type: Echogenic Stimulator Needle     Needle Length: 4cm  Needle Gauge: 22     Additional Needles:   Procedures:,,,, ultrasound used (permanent image in chart),,    Narrative:  Start time: 07/10/2024 11:13 AM End time: 07/10/2024 11:16 AM Injection made incrementally with aspirations every 5 mL.  Performed by: Personally  Anesthesiologist: Paul Lamarr BRAVO, MD  Additional Notes: Risks, benefits, and alternative discussed. Patient gave consent for procedure. Patient prepped and draped in sterile fashion. Sedation administered, patient remains easily responsive to voice. Relevant anatomy identified with ultrasound guidance. Local anesthetic given in 5cc increments with no signs or symptoms of intravascular injection. No pain or paraesthesias with injection. Patient monitored throughout procedure with signs of LAST or immediate complications. Tolerated well. Ultrasound image placed in chart.  LANEY Paul, MD

## 2024-07-10 NOTE — Transfer of Care (Signed)
 Immediate Anesthesia Transfer of Care Note  Patient: Sarah Randall  Procedure(s) Performed: ARTHROSCOPY, SHOULDER WITH DEBRIDEMENT (Right) TENODESIS, BICEPS (Right)  Patient Location: PACU  Anesthesia Type:General  Level of Consciousness: awake, alert , oriented, and patient cooperative  Airway & Oxygen Therapy: Patient Spontanous Breathing and Patient connected to face mask oxygen  Post-op Assessment: Report given to RN and Post -op Vital signs reviewed and stable  Post vital signs: Reviewed and stable  Last Vitals:  Vitals Value Taken Time  BP 127/69 07/10/24 13:00  Temp    Pulse 86 07/10/24 13:02  Resp 25 07/10/24 13:02  SpO2 97 % 07/10/24 13:02  Vitals shown include unfiled device data.  Last Pain:  Vitals:   07/10/24 1009  TempSrc: Temporal  PainSc: 7       Patients Stated Pain Goal: 3 (07/10/24 1009)  Complications: No notable events documented.

## 2024-07-10 NOTE — Brief Op Note (Signed)
   Brief Op Note  Date of Surgery: 07/10/2024  Preoperative Diagnosis: RIGHT SHOULDER BICEPS TENDONITIS  Postoperative Diagnosis: same  Procedure: Procedure(s): ARTHROSCOPY, SHOULDER WITH DEBRIDEMENT TENODESIS, BICEPS  Implants: Implant Name Type Inv. Item Serial No. Manufacturer Lot No. LRB No. Used Action  ANCHOR QUATTRO LINK KNTLS 2.9 - [id number] Anchor ANCHOR LEIGHTON HIGASHI KNTLS 2.9  [employer]IMMER [employer]ON(ORTH,TRAU,BIO,SG) [id number] Right 1 Implanted    Surgeons: Surgeon(s): Genelle Standing, MD  Anesthesia: General    Estimated Blood Loss: See anesthesia record  Complications: None  Condition to PACU: Stable  Standing LITTIE Genelle, MD 07/10/2024 12:51 PM

## 2024-07-10 NOTE — Progress Notes (Signed)
Assisted Dr. Daiva Huge with right, interscalene , ultrasound guided block. Side rails up, monitors on throughout procedure. See vital signs in flow sheet. Tolerated Procedure well.

## 2024-07-10 NOTE — H&P (Signed)
 Expand All Collapse All       Chief Complaint: Right shoulder pain        History of Present Illness:      Sarah Randall is a 58 y.o. female with several years of right shoulder pain.  She did not have any specific traumatic injury.  Her pain is worse with overhead activity or laying directly on the side.  She has previously undergone physical therapy.  She has had an injection in the past which did not give her long-term relief.  Denies any weakness with overhead range of motion predominantly just pain.       PMH/PSH/Family History/Social History/Meds/Allergies:         Past Medical History:  Diagnosis Date   Anemia     Anxiety     Arthritis     B12 deficiency 06/30/2019   Depression     GERD (gastroesophageal reflux disease)      occasionally takes OTC   History of leukocytosis     Hyperlipidemia     Hypertension     Low back pain     Obesity     Pain in right buttock      Goes down to Right leg to lateral aspect of calf.   Pre-diabetes     Sleep apnea      tested 2014 - unable to tolerate machine   Spondylolisthesis at L4-L5 level               Past Surgical History:  Procedure Laterality Date   ABDOMINAL EXPOSURE N/A 01/22/2021    Procedure: LATERAL EXPOSURE FOR OBLIQUE LATERAL INTERBODY FUSION;  Surgeon: Gretta Lonni PARAS, MD;  Location: [facility] OR;  Service: Vascular;  Laterality: N/A;   ABDOMINAL HYSTERECTOMY       ANTERIOR LUMBAR FUSION N/A 01/22/2021    Procedure: Oblique Lateral Interbody Fusion Lumbar Four-Lumbar Five;  Surgeon: Debby Dorn MATSU, MD;  Location: [facility] OR;  Service: Neurosurgery;  Laterality: N/A;   KNEE ARTHROSCOPY WITH MEDIAL MENISECTOMY Left 08/19/2022    Procedure: LEFT KNEE ARTHROSCOPY WITH PARTIAL MEDIAL MENISECTOMY;  Surgeon: Jerri Kay CHRISTELLA, MD;  Location: [facility];  Service: Orthopedics;  Laterality: Left;   LEFT HEART CATH AND CORONARY ANGIOGRAPHY N/A 10/25/2018    Procedure: LEFT HEART CATH AND CORONARY ANGIOGRAPHY;   Surgeon: Swaziland, Peter M, MD;  Location: [facility] INVASIVE CV LAB;  Service: Cardiovascular;  Laterality: N/A;   LUMBAR PERCUTANEOUS PEDICLE SCREW 1 LEVEL N/A 01/22/2021    Procedure: LUMBAR PERCUTANEOUS PEDICLE SCREW LUBAR FOUR- FIVE;  Surgeon: Debby Dorn MATSU, MD;  Location: [facility] OR;  Service: Neurosurgery;  Laterality: N/A;   ROBOTIC ASSISTED LAPAROSCOPIC HYSTERECTOMY AND SALPINGECTOMY Bilateral 12/27/2019    Procedure: XI ROBOTIC ASSISTED TOTAL LAPAROSCOPIC HYSTERECTOMY AND SALPINGECTOMY;  Surgeon: Lavoie, Marie-Lyne, MD;  Location: [facility];  Service: Gynecology;  Laterality: Bilateral;   SYNOVECTOMY Left 08/19/2022    Procedure: LEFT KNEE MAJOR SYNOVECTOMY;  Surgeon: Jerri Kay CHRISTELLA, MD;  Location: [facility];  Service: Orthopedics;  Laterality: Left;   TOTAL HIP ARTHROPLASTY Left 09/22/2017    Procedure: LEFT TOTAL HIP ARTHROPLASTY ANTERIOR APPROACH;  Surgeon: Jerri Kay CHRISTELLA, MD;  Location: [facility] OR;  Service: Orthopedics;  Laterality: Left;   TOTAL KNEE ARTHROPLASTY Right 01/12/2018    Procedure: RIGHT TOTAL KNEE ARTHROPLASTY;  Surgeon: Jerri Kay CHRISTELLA, MD;  Location: [facility] OR;  Service: Orthopedics;  Laterality: Right;   TUBAL LIGATION  Social History         Socioeconomic History   Marital status: Single      Spouse name: Not on file   Number of children: 2   Years of education: 9   Highest education level: Not on file  Occupational History   Occupation: unemployed  Tobacco Use   Smoking status: Every Day      Current packs/day: 0.25      Average packs/day: 0.3 packs/day for 30.0 years (7.5 ttl pk-yrs)      Types: Cigarettes   Smokeless tobacco: Never  Vaping Use   Vaping status: Never Used  Substance and Sexual Activity   Alcohol use: Yes      Alcohol/week: 4.0 - 6.0 standard drinks of alcohol      Types: 4 - 6 Shots of liquor per week      Comment: occ   Drug use: Not Currently      Frequency: 2.0 times per week      Types: Marijuana       Comment: states she does not use marijuana any more   Sexual activity: Yes      Birth control/protection: None      Comment: declined insurance questions,des neg  Other Topics Concern   Not on file  Social History Narrative    Left handed     disability    Social Drivers of Acupuncturist Strain: Not on file  Food Insecurity: Not on file  Transportation Needs: Not on file  Physical Activity: Not on file  Stress: Not on file  Social Connections: Unknown (04/21/2022)    Received from [employer], [facility]    Social Network     Social Network: Not on file         Family History  Problem Relation Age of Onset   Hypertension Mother     Diabetes Mother     Breast cancer Mother 32   Hypertension Father     Lung cancer Maternal Aunt     Colon polyps Brother     Colon cancer Neg Hx     Esophageal cancer Neg Hx     Rectal cancer Neg Hx     Stomach cancer Neg Hx          Allergies       Allergies  Allergen Reactions   Pregabalin Swelling   Cymbalta  [Duloxetine  Hcl] Nausea Only   Escitalopram         Made her feel out of it.   Gabapentin  Swelling   Lipitor [Atorvastatin ] Other (See Comments)      Stabbing pains in legs   Pravachol  [Pravastatin ]        Sharp pains in LT leg   Repatha  [Evolocumab ] Other (See Comments)      Nausea, GI upset   Zoloft  [Sertraline  Hcl] Other (See Comments)      tremors            Current Outpatient Medications  Medication Sig Dispense Refill   acetaminophen  (TYLENOL ) 500 MG tablet Take 1 tablet (500 mg total) by mouth every 8 (eight) hours for 10 days. 30 tablet 0   aspirin  EC 325 MG tablet Take 1 tablet (325 mg total) by mouth daily. 14 tablet 0   Cyanocobalamin  (B-12) 5000 MCG CAPS Take 5,000 mcg by mouth every other day.       diclofenac  Sodium (VOLTAREN ) 1 % GEL Apply 2 g topically 4 (four) times  daily. 100 g 1   esomeprazole  (NEXIUM ) 40 MG capsule TAKE 1 CAPSULE BY MOUTH EVERY DAY 90 capsule 0    hydrochlorothiazide  (HYDRODIURIL ) 25 MG tablet TAKE 1 TABLET BY MOUTH EVERY DAY 90 tablet 0   HYDROcodone -acetaminophen  (NORCO) 5-325 MG tablet Take 1 tablet by mouth every 6 (six) hours as needed. 20 tablet 0   ipratropium (ATROVENT ) 0.03 % nasal spray Place 2 sprays into both nostrils every 12 (twelve) hours. 30 mL 0   lactulose  (CHRONULAC ) 10 GM/15ML solution Take 30 mLs (20 g total) by mouth daily as needed for mild constipation or moderate constipation. 236 mL 0   lidocaine  (LIDODERM ) 5 % Place 1 patch onto the skin daily. Remove & Discard patch within 12 hours or as directed by MD 30 patch 0   loratadine  (CLARITIN ) 10 MG tablet TAKE 1 TABLET BY MOUTH EVERY DAY AS NEEDED FOR ALLERGY 90 tablet 1   losartan  (COZAAR ) 100 MG tablet TAKE 1 TABLET BY MOUTH EVERY DAY 90 tablet 0   nitroGLYCERIN  (NITROSTAT ) 0.4 MG SL tablet Place 1 tablet (0.4 mg total) under the tongue every 5 (five) minutes as needed. 25 tablet 11   oxyCODONE -acetaminophen  (PERCOCET) 10-325 MG tablet Take 1 tablet by mouth every 6 (six) hours.       pregabalin (LYRICA) 75 MG capsule Take 75 mg by mouth 2 (two) times daily.       propranolol  (INDERAL ) 10 MG tablet TAKE 1 TABLET BY MOUTH TWICE A DAY 180 tablet 0      No current facility-administered medications for this visit.      Imaging Results (Last 48 hours)  No results found.     Review of Systems:   A ROS was performed including pertinent positives and negatives as documented in the HPI.   Physical Exam :   Constitutional: NAD and appears stated age Neurological: Alert and oriented Psych: Appropriate affect and cooperative Last menstrual period 10/10/2019.    Comprehensive Musculoskeletal Exam:     Right shoulder with positive Neer impingement.  Active forward elevation is 170 degrees bilaterally.  External rotation is to 45 bilaterally.  Internal rotation is to L1.  Positive speeds maneuver     Imaging:   Xray (4 views right shoulder): Normal   MRI (right  shoulder): Significant biceps tearing and bursal sided fraying consistent with subacromial impingement     I personally reviewed and interpreted the radiographs.     Assessment and Plan:   58 y.o. female with evidence of right shoulder rotator cuff impingement which has now failed an injection as well as physical therapy.  Given the fact that she has trialed conservative measures without relief we did discuss the possibility of right shoulder arthroscopy with acromioplasty and biceps tenodesis.  I discussed the risks and benefits associated with this.  I did discuss associated recovery timeframe.  After discussion she would like to proceed with this   -Plan for right shoulder arthroscopy with biceps tenodesis and acromioplasty     After a lengthy discussion of treatment options, including risks, benefits, alternatives, complications of surgical and nonsurgical conservative options, the patient elected surgical repair.    The patient  is aware of the material risks  and complications including, but not limited to injury to adjacent structures, neurovascular injury, infection, numbness, bleeding, implant failure, thermal burns, stiffness, persistent pain, failure to heal, disease transmission from allograft, need for further surgery, dislocation, anesthetic risks, blood clots, risks of death,and others. The probabilities of surgical success and  failure discussed with patient given their particular co-morbidities.The time and nature of expected rehabilitation and recovery was discussed.The patient's questions were all answered preoperatively.  No barriers to understanding were noted. I explained the natural history of the disease process and Rx rationale.  I explained to the patient what I considered to be reasonable expectations given their personal situation.  The final treatment plan was arrived at through a shared patient decision making process model.       I personally saw and evaluated the  patient, and participated in the management and treatment plan.   Elspeth Parker, MD Attending Physician, Orthopedic Surgery   This document was dictated using Dragon voice recognition software. A reasonable attempt at proof reading has been made to minimize errors.

## 2024-07-10 NOTE — Anesthesia Procedure Notes (Signed)
 Procedure Name: Intubation Date/Time: 07/10/2024 12:00 PM  Performed by: Donnell Berwyn SQUIBB, CRNAPre-anesthesia Checklist: Patient identified, Emergency Drugs available, Suction available, Timeout performed and Patient being monitored Patient Re-evaluated:Patient Re-evaluated prior to induction Oxygen Delivery Method: Circle system utilized Preoxygenation: Pre-oxygenation with 100% oxygen Induction Type: IV induction Ventilation: Mask ventilation without difficulty and Oral airway inserted - appropriate to patient size Laryngoscope Size: Mac and 3 Grade View: Grade I Tube type: Oral Tube size: 7.0 mm Number of attempts: 1 Airway Equipment and Method: Stylet Placement Confirmation: ETT inserted through vocal cords under direct vision, positive ETCO2 and breath sounds checked- equal and bilateral Secured at: 21 cm Tube secured with: Tape Dental Injury: Teeth and Oropharynx as per pre-operative assessment

## 2024-07-10 NOTE — Therapy (Incomplete)
 OUTPATIENT PHYSICAL THERAPY SHOULDER EVALUATION   Patient Name: Sarah Randall MRN: [id number] DOB:1966/06/13, 58 y.o., female Today's Date: 07/10/2024   END OF SESSION:   Past Medical History:  Diagnosis Date   Anemia    Anxiety    Arthritis    B12 deficiency 06/30/2019   Depression    GERD (gastroesophageal reflux disease)    occasionally takes OTC   History of leukocytosis    Hyperlipidemia    Hypertension    Low back pain    Obesity    Pain in right buttock    Goes down to Right leg to lateral aspect of calf.   Pre-diabetes    Sleep apnea    tested 2014 - unable to tolerate machine   Spondylolisthesis at L4-L5 level    Past Surgical History:  Procedure Laterality Date   ABDOMINAL EXPOSURE N/A 01/22/2021   Procedure: LATERAL EXPOSURE FOR OBLIQUE LATERAL INTERBODY FUSION;  Surgeon: Gretta Lonni PARAS, MD;  Location: [facility] OR;  Service: Vascular;  Laterality: N/A;   ABDOMINAL HYSTERECTOMY     ANTERIOR LUMBAR FUSION N/A 01/22/2021   Procedure: Oblique Lateral Interbody Fusion Lumbar Four-Lumbar Five;  Surgeon: Debby Dorn MATSU, MD;  Location: [facility] OR;  Service: Neurosurgery;  Laterality: N/A;   KNEE ARTHROSCOPY WITH MEDIAL MENISECTOMY Left 08/19/2022   Procedure: LEFT KNEE ARTHROSCOPY WITH PARTIAL MEDIAL MENISECTOMY;  Surgeon: Jerri Kay CHRISTELLA, MD;  Location: [facility];  Service: Orthopedics;  Laterality: Left;   LEFT HEART CATH AND CORONARY ANGIOGRAPHY N/A 10/25/2018   Procedure: LEFT HEART CATH AND CORONARY ANGIOGRAPHY;  Surgeon: Swaziland, Peter M, MD;  Location: [facility] INVASIVE CV LAB;  Service: Cardiovascular;  Laterality: N/A;   LUMBAR PERCUTANEOUS PEDICLE SCREW 1 LEVEL N/A 01/22/2021   Procedure: LUMBAR PERCUTANEOUS PEDICLE SCREW LUBAR FOUR- FIVE;  Surgeon: Debby Dorn MATSU, MD;  Location: [facility] OR;  Service: Neurosurgery;  Laterality: N/A;   ROBOTIC ASSISTED LAPAROSCOPIC HYSTERECTOMY AND SALPINGECTOMY Bilateral 12/27/2019   Procedure: XI ROBOTIC ASSISTED TOTAL  LAPAROSCOPIC HYSTERECTOMY AND SALPINGECTOMY;  Surgeon: Lavoie, Marie-Lyne, MD;  Location: [facility];  Service: Gynecology;  Laterality: Bilateral;   SYNOVECTOMY Left 08/19/2022   Procedure: LEFT KNEE MAJOR SYNOVECTOMY;  Surgeon: Jerri Kay CHRISTELLA, MD;  Location: [facility];  Service: Orthopedics;  Laterality: Left;   TOTAL HIP ARTHROPLASTY Left 09/22/2017   Procedure: LEFT TOTAL HIP ARTHROPLASTY ANTERIOR APPROACH;  Surgeon: Jerri Kay CHRISTELLA, MD;  Location: [facility] OR;  Service: Orthopedics;  Laterality: Left;   TOTAL KNEE ARTHROPLASTY Right 01/12/2018   Procedure: RIGHT TOTAL KNEE ARTHROPLASTY;  Surgeon: Jerri Kay CHRISTELLA, MD;  Location: [facility] OR;  Service: Orthopedics;  Laterality: Right;   TUBAL LIGATION     Patient Active Problem List   Diagnosis Date Noted   Pigmented villonodular synovitis of left knee 08/19/2022   Acute medial meniscus tear of left knee 07/21/2022   Recurrent sinusitis 01/27/2022   Trigger index finger of right hand 01/27/2022   Acute otitis media 12/23/2021   Multiple joint pain 02/10/2021   Positive ANA (antinuclear antibody) 12/13/2020   Trigger finger of left thumb 12/10/2020   Lumbar radiculopathy 11/07/2020   Chronic right-sided low back pain with right-sided sciatica 10/10/2020   Sleep disturbance 10/10/2020   Myalgia 10/10/2020   Super obese 10/10/2020   Abnormality of gait 10/10/2020   Statin intolerance 09/17/2020   Recurrent boils 09/17/2020   Chronic bilateral low back pain 05/16/2020   Postoperative state 12/27/2019   Iron  deficiency 08/03/2019   B12  deficiency 06/30/2019   Chronic right shoulder pain 07/05/2018   Trochanteric bursitis of left hip 07/05/2018   Morbid obesity (HCC) 07/05/2018   Body mass index 45.0-49.9, adult (HCC) 07/05/2018   Marijuana user 05/26/2018   Status post total right knee replacement 01/12/2018   Status post total hip replacement, left 09/22/2017   Hyperlipidemia 08/10/2017   Mild depression 06/25/2017    Essential hypertension 06/25/2017   Tobacco abuse 06/25/2017   Prediabetes 06/25/2017   OSA on CPAP 06/25/2017   Bronchitis 09/24/2016   Leukocytosis 09/24/2016   Radiculopathy, lumbar region 07/16/2016   Degenerative lumbar disc 07/03/2016   Synovitis of hip 04/20/2016   Screening for breast cancer 12/28/2014   Type 2 diabetes mellitus with obesity (HCC) 04/06/2014   Pain in joint involving lower leg 05/13/2013    PCP: Elizbeth Leita Ruth, FNP   REFERRING PROVIDER: Genelle Standing, MD   REFERRING DIAG: M75.21 (ICD-10-CM) - Biceps tendonitis, right  THERAPY DIAG:  No diagnosis found.  RATIONALE FOR EVALUATION AND TREATMENT: Rehabilitation  ONSET DATE: Surgery 07/10/24  NEXT MD VISIT: 07/21/24   SUBJECTIVE:                                                                                                                                                                                                         SUBJECTIVE STATEMENT: ***Patient is referred to PT from Dr Genelle following a R shoulder bicep tenodesis and SAD on 07/10/24.    PAIN: Are you having pain? Yes: NPRS scale: *** Pain location: R shoulder Pain description: *** Aggravating factors: *** Relieving factors: ***  PERTINENT HISTORY:  L THA, R TKA, back surgery/fusion, HTN, OA, multi joint pain, Diabetes, obesity  PRECAUTIONS: Shoulder  RED FLAGS: {PT Red Flags:29287}  HAND DOMINANCE: {Hand Dominance:29389}  WEIGHT BEARING RESTRICTIONS: {Yes ***/No:24003}  FALLS:  Has patient fallen in last 6 months? {fallsyesno:27318}  LIVING ENVIRONMENT: Lives with: {OPRC lives with:25569::lives with their family} Lives in: {Lives in:25570} Stairs: {opstairs:27293} Has following equipment at home: {Assistive devices:23999}  OCCUPATION: ***  PLOF: {PLOF:24004}  PATIENT GOALS: ***   OBJECTIVE: (objective measures completed at initial evaluation unless otherwise dated)  DIAGNOSTIC FINDINGS:   ***  PATIENT SURVEYS:  {rehab surveys:24030:a}Quick Dash =   COGNITION: Overall cognitive status: {cognition:24006}     SENSATION: {sensation:27233}  POSTURE: {posture:25561}  UPPER EXTREMITY Randall:   {AROM/PROM:27142} Randall Right eval Left eval  Shoulder flexion    Shoulder extension    Shoulder abduction    Shoulder adduction    Shoulder internal rotation    Shoulder external rotation  Elbow flexion    Elbow extension    Wrist flexion    Wrist extension    Wrist ulnar deviation    Wrist radial deviation    Wrist pronation    Wrist supination    (Blank rows = not tested)  UPPER EXTREMITY MMT:  MMT Right eval Left eval  Shoulder flexion    Shoulder extension    Shoulder abduction    Shoulder adduction    Shoulder internal rotation    Shoulder external rotation    Middle trapezius    Lower trapezius    Elbow flexion    Elbow extension    Wrist flexion    Wrist extension    Wrist ulnar deviation    Wrist radial deviation    Wrist pronation    Wrist supination    Grip strength (lbs)    (Blank rows = not tested)  SHOULDER SPECIAL TESTS: Impingement tests: {shoulder impingement test:25231:a} SLAP lesions: {SLAP lesions:25232} Instability tests: {shoulder instability test:25233} Rotator cuff assessment: {rotator cuff assessment:25234} Biceps assessment: {biceps assessment:25235}  JOINT MOBILITY TESTING:  ***  PALPATION:  ***    TODAY'S TREATMENT:   ***   PATIENT EDUCATION:  Education details: PT eval findings, anticipated POC, and initial HEP  Person educated: Patient Education method: Programmer, multimedia, Demonstration, Verbal cues, Tactile cues, and Handouts Education comprehension: verbalized understanding, verbal cues required, tactile cues required, and needs further education  HOME EXERCISE PROGRAM: ***   ASSESSMENT:  CLINICAL IMPRESSION: KORTNE ALL is a 58 y.o. female who was referred to physical therapy for evaluation and treatment  for R shoulder arthroscopy 07/10/24 for bicep tenodesis and SAD.    Patient reports onset of *** pain beginning ***. Pain is worse with ***.  Patient has deficits in *** Randall, *** flexibility, *** strength, ***abnormal posture, and TTP with abnormal muscle tension *** which are interfering with ADLs and are impacting quality of life.  On QuickDASH patient scored ***/100 demonstrating ***% disability.  Roberto will benefit from skilled PT to address above deficits to improve mobility and activity tolerance with decreased pain interference.   OBJECTIVE IMPAIRMENTS: {opptimpairments:25111}.   ACTIVITY LIMITATIONS: {activitylimitations:27494}  PARTICIPATION LIMITATIONS: {participationrestrictions:25113}  PERSONAL FACTORS: 1-2 comorbidities:  L THA, R TKA, back surgery/fusion, HTN, OA, multi joint pain, Diabetes, obesity  are also affecting patient's functional outcome.   REHAB POTENTIAL: Good  CLINICAL DECISION MAKING: Evolving/moderate complexity  EVALUATION COMPLEXITY: Moderate   GOALS: Goals reviewed with patient? Yes  SHORT TERM GOALS: Target date: 08/10/2024  Patient will be independent with initial HEP to improve outcomes and carryover.  Baseline: *** Goal status: INITIAL  2.  Patient will report 25% improvement in R shoulder pain to improve QOL.   Baseline: *** Goal status: INITIAL  3.  *** Baseline: *** Goal status: {GOALSTATUS:25110}  LONG TERM GOALS: Target date: 09/07/2024  Patient will be independent with ongoing/advanced HEP for self-management at home.  Baseline: 100% PT assist required for correct completion Goal status: INITIAL  2.  Patient will report 50-75% improvement in R shoulder pain to improve QOL.  Baseline: *** Goal status: INITIAL  3.  Patient to demonstrate improved upright posture with posterior shoulder girdle engaged to promote improved glenohumeral joint mobility. Baseline: *** Goal status: INITIAL  4.  Patient to improve R shoulder AROM to [facility]  without pain provocation to allow for increased ease of ADLs.  Baseline: Refer to above UE Randall table Goal status: INITIAL  5.  Patient will demonstrate improved R shoulder strength to >/=  5/5 for functional UE use. Baseline: Refer to above UE MMT table Goal status: INITIAL  6  Patient will report </= ***% on QuickDASH (MCID = 14%) to demonstrate improved functional ability.  Baseline: *** Goal status: INITIAL  7.  Patient will ***   Baseline: *** Goal status: {GOALSTATUS:25110}   8. *** Baseline: *** Goal status: {GOALSTATUS:25110}   PLAN:  PT FREQUENCY: 1-2x/week  PT DURATION: 8 weeks  PLANNED INTERVENTIONS: 97164- PT Re-evaluation, 97750- Physical Performance Testing, 97110-Therapeutic exercises, 97530- Therapeutic activity, [id number]- Neuromuscular re-education, 97535- Self Care, 02859- Manual therapy, G0283- Electrical stimulation (unattended), [id number]- Electrical stimulation (manual), [id number]- Vasopneumatic device, [id number]- Ultrasound, [id number]- Traction (mechanical), [id number]- Ionotophoresis 4mg /ml Dexamethasone , 79439 (1-2 muscles), 20561 (3+ muscles)- Dry Needling, Patient/Family education, Taping, Joint mobilization, Spinal mobilization, Cryotherapy, and Moist heat  PLAN FOR NEXT SESSION: ***   Joren Rehm, PT 07/10/2024, 8:54 AM

## 2024-07-10 NOTE — Op Note (Signed)
 Date of Surgery: 07/10/2024  INDICATIONS: Ms. Sarah Randall is a 58 y.o.-year-old female with right shoulder biceps tendonitis .  The risk and benefits of the procedure were discussed in detail and documented in the pre-operative evaluation.   PREOPERATIVE DIAGNOSES: Right shoulder, biceps tendinitis and subacromial impingement.  POSTOPERATIVE DIAGNOSIS: Same.  PROCEDURE: Arthroscopic subacromial decompression - 70173 Arthroscopic biceps tenodesis - 70171  SURGEON: Elspeth LITTIE Parker MD  ASSISTANT: Conley Funk, ATC  ANESTHESIA:  general  IV FLUIDS AND URINE: See anesthesia record.  ANTIBIOTICS: Ancef   ESTIMATED BLOOD LOSS: 5 mL.  IMPLANTS:  Implant Name Type Inv. Item Serial No. Manufacturer Lot No. LRB No. Used Action  ANCHOR QUATTRO LINK KNTLS 2.9 - [id number] Anchor ANCHOR QUATTRO LINK KNTLS 2.9  [employer](ORTH,TRAU,BIO,SG) [id number] Right 1 Implanted    DRAINS: None  CULTURES: None  COMPLICATIONS: none  PROCEDURE:    OPERATIVE FINDING: Exam under anesthesia:   Examination under anesthesia revealed forward elevation of 150 degrees.  With the arm at the side, there was 65 degrees of external rotation.  There is a 1+ anterior load shift and a 1+ posterior load shift.    Arthroscopic findings demonstrated: Articular space: Rotator interval redness erythema Chondral surfaces: Grade 3-4 cartilage changes inferior glenoid, inferior humerus grade 2 changes involving the proximal humerus Biceps: Redness Subscapularis: Intact Supraspinatus: Redness undersurface fraying Infraspinatus: Intact    I identified the patient in the pre-operative holding area.  I marked the operative right shoulder with my initials. I reviewed the risks and benefits of the proposed surgical intervention and the patient wished to proceed.  Anesthesia was then performed with regional block.  The patient was transferred to the operative suite and placed in the beach chair position with all bony  prominences padded.     SCDs were placed on bilateral lower extremity. Appropriate antibiotics was administered within 1 hour before incision.  Anesthesia was induced.  The operative extremity was then prepped and draped in standard fashion. A time out was performed confirming the correct extremity, correct patient and correct procedure.   The arthroscope was introduced in the glenohumeral joint from a posterior portal.  An anterior portal was created.  The shoulder was examined and the above findings were noted.     With an arthroscopic shaver and a wand ablator, synovitis throughout the  shoulder was resected.  The arthroscopic shaver was used to excise torn portions of the labrum back to a stable margin. Specifically this was done for the anterior and superior labrum. The rotator interval was debrided using shaver and electrocautery.  At this time the biceps was tagged with a self passing device with a #2 nonabsorbable suture twice.  This was done through the anterior portal.  This was placed in a single 2.9 Quattro link anchor.     The rotator cuff was then approached through the subacromial space. Anterior, lateral, and posterior portals were used.  Bursectomy was performed with an arthroscopic shaver and [employer] wand.  The soft tissues and 1 mm of bone on the undersurface of the acromion and clavicle were resected with the arthroscopic shaver and wand.    The shoulder was irrigated.  The arthroscopic instruments were removed.  Wounds were closed with 3-0 nylon sutures.  A sterile dressing was applied with xeroform, 4x8s, abdominal pad, and tape. An Rosalita was placed and the upper extremity was placed in a shoulder immobilizer.  The patient tolerated the procedure well and was taken to the recovery room in stable condition.  All counts were correct in the case. The patient tolerated the procedure well and was taken to the recovery room in stable condition.    POSTOPERATIVE PLAN: She will be  weightbearing and activity as tolerated once her nerve block wears off.  She will follow the biceps tendon protocol.  I will plan to see her back in 2 weeks for reassessment  Elspeth LITTIE Parker, MD 12:51 PM

## 2024-07-10 NOTE — Discharge Instructions (Addendum)
 Discharge Instructions    Attending Surgeon: Elspeth Parker, MD Office Phone Number: [phone]   Diagnosis and Procedures:    Surgeries Performed: Right shoulder debridement, biceps tenodesis  Discharge Plan:    Diet: Resume usual diet. Begin with light or bland foods.  Drink plenty of fluids.  Activity:  Keep sling and dressing in place until your follow up visit in Physical Therapy You are advised to go home directly from the hospital or surgical center. Restrict your activities.  GENERAL INSTRUCTIONS: 1.  Keep your surgical site elevated above your heart for at least 5-7 days or longer to prevent swelling. This will improve your comfort and your overall recovery following surgery.     2. Please call Dr. Danetta office at [phone] with questions Monday-Friday during business hours. If no one answers, please leave a message and someone should get back to the patient within 24 hours. For emergencies please call 911 or proceed to the emergency room.   3. Patient to notify surgical team if experiences any of the following: Bowel/Bladder dysfunction, uncontrolled pain, nerve/muscle weakness, incision with increased drainage or redness, nausea/vomiting and Fever greater than 101.0 F.  Be alert for signs of infection including redness, streaking, odor, fever or chills. Be alert for excessive pain or bleeding and notify your surgeon immediately.  WOUND INSTRUCTIONS:   Leave your dressing/cast/splint in place until your post operative visit.  Keep it clean and dry.  Always keep the incision clean and dry until the staples/sutures are removed. If there is no drainage from the incision you should keep it open to air. If there is drainage from the incision you must keep it covered at all times until the drainage stops  Do not soak in a bath tub, hot tub, pool, lake or other body of water until 21 days after your surgery and your incision is completely dry and healed.  If you  have removable sutures (or staples) they must be removed 10-14 days (unless otherwise instructed) from the day of your surgery.     1)  Elevate the extremity as much as possible.  2)  Keep the dressing clean and dry.  3)  Please call us  if the dressing becomes wet or dirty.  4)  If you are experiencing worsening pain or worsening swelling, please call.     MEDICATIONS: Resume all previous home medications at the previous prescribed dose and frequency unless otherwise noted Start taking the  pain medications on an as-needed basis as prescribed  Please taper down pain medication over the next week following surgery.  Ideally you should not require a refill of any narcotic pain medication.  Take pain medication with food to minimize nausea. In addition to the prescribed pain medication, you may take over-the-counter pain relievers such as Tylenol .  Do NOT take additional tylenol  if your pain medication already has tylenol  in it.  Aspirin  325mg  daily per bottle instructions. Narcotic Policy: Per [facility] clinic policy, our goal is ensure optimal postoperative pain control with a multimodal pain management strategy. For all [facility] patients, our goal is to wean post-operative narcotic medications by 6 weeks post-operatively, and many times sooner. If this is not possible due to utilization of pain medication prior to surgery, your [facility] doctor will support your acute post-operative pain control for the first 6 weeks postoperatively, with a plan to transition you back to your primary pain team following that. Maralee will work to ensure a Therapist, occupational.  FOLLOWUP INSTRUCTIONS: 1. Follow up at the Physical Therapy Clinic 3-4 days following surgery. This appointment should be scheduled unless other arrangements have been made.The Physical Therapy scheduling number is [phone] if an appointment has not already been arranged.  2. Contact Dr. Danetta office during office hours at  [phone] or the practice after hours line at [phone] for non-emergencies. For medical emergencies call 911.   Discharge Location: Home  No Tylenol  until 4:17pm today, if needed.

## 2024-07-10 NOTE — Anesthesia Preprocedure Evaluation (Addendum)
 Anesthesia Evaluation  Patient identified by MRN, date of birth, ID band Patient awake    Reviewed: Allergy & Precautions, NPO status , Patient's Chart, lab work & pertinent test results  History of Anesthesia Complications Negative for: history of anesthetic complications  Airway Mallampati: III  TM Distance: >3 FB Neck ROM: Full    Dental  (+) Missing,    Pulmonary sleep apnea , Current Smoker and Patient abstained from smoking.   Pulmonary exam normal        Cardiovascular hypertension, Pt. on medications Normal cardiovascular exam     Neuro/Psych   Anxiety Depression       GI/Hepatic Neg liver ROS,GERD  Medicated,,  Endo/Other  diabetes, Type 2, Oral Hypoglycemic Agents  Class 4 obesity (BMI 52)  Renal/GU negative Renal ROS  negative genitourinary   Musculoskeletal  (+) Arthritis ,    Abdominal   Peds  Hematology negative hematology ROS (+)   Anesthesia Other Findings Day of surgery medications reviewed with patient.  Reproductive/Obstetrics                              Anesthesia Physical Anesthesia Plan  ASA: 3  Anesthesia Plan: General   Post-op Pain Management: Tylenol  PO (pre-op)* and Regional block*   Induction: Intravenous  PONV Risk Score and Plan: 3 and Treatment may vary due to age or medical condition, Ondansetron , Dexamethasone  and Midazolam   Airway Management Planned: Oral ETT  Additional Equipment: None  Intra-op Plan:   Post-operative Plan: Extubation in OR  Informed Consent: I have reviewed the patients History and Physical, chart, labs and discussed the procedure including the risks, benefits and alternatives for the proposed anesthesia with the patient or authorized representative who has indicated his/her understanding and acceptance.     Dental advisory given  Plan Discussed with: CRNA  Anesthesia Plan Comments:          Anesthesia Quick  Evaluation

## 2024-07-11 ENCOUNTER — Encounter (HOSPITAL_BASED_OUTPATIENT_CLINIC_OR_DEPARTMENT_OTHER): Payer: Self-pay | Admitting: Orthopaedic Surgery

## 2024-07-14 ENCOUNTER — Ambulatory Visit: Admitting: Rehabilitation

## 2024-07-19 ENCOUNTER — Telehealth: Payer: Self-pay

## 2024-07-19 NOTE — Telephone Encounter (Signed)
 Patient will call back when ready.  Her knees are not hurting at this time

## 2024-07-20 ENCOUNTER — Ambulatory Visit (INDEPENDENT_AMBULATORY_CARE_PROVIDER_SITE_OTHER): Admitting: Orthopedic Surgery

## 2024-07-20 DIAGNOSIS — M5416 Radiculopathy, lumbar region: Secondary | ICD-10-CM | POA: Diagnosis not present

## 2024-07-20 NOTE — Progress Notes (Signed)
 Orthopedic Spine Surgery Office Note   Assessment: Patient is a 58 y.o. female with low back pain that radiates into the lateral aspect of the right thigh and leg.  Has a disc herniation at L5/S1 causing radiculopathy     Plan: -Patient has tried  tylenol , aleve , oral steroids, pregabalin, oxycodone  - Recommended a diagnostic/therapeutic L5 transforaminal injection -Continue with pain management for pain control -Would need to be nicotine  free prior to any elective spine surgery.  Explained that her weight would also put her at increased risk of a complication but would proceed if she can become nicotine  free -Patient should return to office in 6 weeks, x-rays at next visit: none     Patient expressed understanding of the plan and all questions were answered to the patient's satisfaction.    ___________________________________________________________________________     History:   Patient is a 58 y.o. female who presents today for follow-up on her lumbar spine.  Patient continues to have significant low back pain that radiates into her right lower extremity.  She feels the going along the posterolateral aspect of the thigh and leg.  No pain radiating into the left lower extremity.  She has not found relief with any of the conservative treatments that have tried so far.  She feels the pain on a daily basis.  She notes it with activity and at rest.   Treatments tried: tylenol , aleve , oral steroids, pregabalin, oxycodone      Physical Exam:   General: no acute distress, appears stated age Neurologic: alert, answering questions appropriately, following commands Respiratory: unlabored breathing on room air, symmetric chest rise Psychiatric: appropriate affect, normal cadence to speech     MSK (spine):   -Strength exam                                                   Left                  Right EHL                              5/5                  5/5 TA                                  5/5                  5/5 GSC                             5/5                  5/5 Knee extension            5/5                  5/5 Hip flexion                    5/5                  5/5   -Sensory exam  Sensation intact to light touch in L3-S1 nerve distributions of bilateral lower extremities   Imaging: XRs of the lumbar spine from 06/21/2024 were previously independently reviewed and interpreted, showing interbody device at L4/5 that appears in appropriate position. Posterior instrumentation at L4 and L5 without any lucency around the screws. Disc height loss at L3/4 and L5/S1. No fracture or dislocation seen.    MRI of the lumbar spine from 06/23/2024 shows mild central stenosis L3/4.  Large paracentral disc herniation on the right at L5/S1 abutting the traversing nerve root.      Patient name: Sarah Randall Patient MRN: [id number] Date of visit: 07/20/24

## 2024-07-21 ENCOUNTER — Ambulatory Visit (HOSPITAL_BASED_OUTPATIENT_CLINIC_OR_DEPARTMENT_OTHER): Admitting: Orthopaedic Surgery

## 2024-07-21 DIAGNOSIS — M7541 Impingement syndrome of right shoulder: Secondary | ICD-10-CM

## 2024-07-21 NOTE — Progress Notes (Signed)
 Post Operative Evaluation    Procedure/Date of Surgery: Right shoulder biceps tenodesis subacromial 8/4  Interval History:    Presents 2 weeks status post above procedure.  Overall doing extremely well.  Overhead range of motion is normalized   PMH/PSH/Family History/Social History/Meds/Allergies:    Past Medical History:  Diagnosis Date   Anemia    Anxiety    Arthritis    B12 deficiency 06/30/2019   Depression    GERD (gastroesophageal reflux disease)    occasionally takes OTC   History of leukocytosis    Hyperlipidemia    Hypertension    Low back pain    Obesity    Pain in right buttock    Goes down to Right leg to lateral aspect of calf.   Pre-diabetes    Sleep apnea    tested 2014 - unable to tolerate machine   Spondylolisthesis at L4-L5 level    Past Surgical History:  Procedure Laterality Date   ABDOMINAL EXPOSURE N/A 01/22/2021   Procedure: LATERAL EXPOSURE FOR OBLIQUE LATERAL INTERBODY FUSION;  Surgeon: Gretta Lonni PARAS, MD;  Location: [facility] OR;  Service: Vascular;  Laterality: N/A;   ABDOMINAL HYSTERECTOMY     ANTERIOR LUMBAR FUSION N/A 01/22/2021   Procedure: Oblique Lateral Interbody Fusion Lumbar Four-Lumbar Five;  Surgeon: Debby Dorn MATSU, MD;  Location[facility] OR;  Service: Neurosurgery;  Laterality: N/A;   BICEPT TENODESIS Right 07/10/2024   Procedure: TENODESIS, BICEPS;  Surgeon: Genelle Standing, MD;  Location: [facility];  Service: Orthopedics;  Laterality: Right;   KNEE ARTHROSCOPY WITH MEDIAL MENISECTOMY Left 08/19/2022   Procedure: LEFT KNEE ARTHROSCOPY WITH PARTIAL MEDIAL MENISECTOMY;  Surgeon: Jerri Kay HERO, MD;  Location: [facility];  Service: Orthopedics;  Laterality: Left;   LEFT HEART CATH AND CORONARY ANGIOGRAPHY N/A 10/25/2018   Procedure: LEFT HEART CATH AND CORONARY ANGIOGRAPHY;  Surgeon: Swaziland, Peter M, MD;  Location: [facility] INVASIVE CV LAB;  Service: Cardiovascular;  Laterality:  N/A;   LUMBAR PERCUTANEOUS PEDICLE SCREW 1 LEVEL N/A 01/22/2021   Procedure: LUMBAR PERCUTANEOUS PEDICLE SCREW LUBAR FOUR- FIVE;  Surgeon: Debby Dorn MATSU, MD;  Location: [facility] OR;  Service: Neurosurgery;  Laterality: N/A;   POSTERIOR LUMBAR FUSION 2 WITH HARDWARE REMOVAL Right 07/10/2024   Procedure: ARTHROSCOPY, SHOULDER WITH DEBRIDEMENT;  Surgeon: Genelle Standing, MD;  Location: [facility];  Service: Orthopedics;  Laterality: Right;  RIGHT SHOULDER ARTHROSCOPY WITH ACROMIOPLASTY AND BICEPS TENODESIS   ROBOTIC ASSISTED LAPAROSCOPIC HYSTERECTOMY AND SALPINGECTOMY Bilateral 12/27/2019   Procedure: XI ROBOTIC ASSISTED TOTAL LAPAROSCOPIC HYSTERECTOMY AND SALPINGECTOMY;  Surgeon: Lavoie, Marie-Lyne, MD;  Location: [facility] [place];  Service: Gynecology;  Laterality: Bilateral;   SYNOVECTOMY Left 08/19/2022   Procedure: LEFT KNEE MAJOR SYNOVECTOMY;  Surgeon: Jerri Kay HERO, MD;  Location: [facility];  Service: Orthopedics;  Laterality: Left;   TOTAL HIP ARTHROPLASTY Left 09/22/2017   Procedure: LEFT TOTAL HIP ARTHROPLASTY ANTERIOR APPROACH;  Surgeon: Jerri Kay HERO, MD;  Location: [facility] OR;  Service: Orthopedics;  Laterality: Left;   TOTAL KNEE ARTHROPLASTY Right 01/12/2018   Procedure: RIGHT TOTAL KNEE ARTHROPLASTY;  Surgeon: Jerri Kay HERO, MD;  Location: [facility] OR;  Service: Orthopedics;  Laterality: Right;   TUBAL LIGATION     Social History   Socioeconomic History   Marital status: Single  Spouse name: Not on file   Number of children: 2   Years of education: 9   Highest education level: Not on file  Occupational History   Occupation: unemployed  Tobacco Use   Smoking status: Every Day    Current packs/day: 0.25    Average packs/day: 0.3 packs/day for 30.0 years (7.5 ttl pk-yrs)    Types: Cigarettes   Smokeless tobacco: Never  Vaping Use   Vaping status: Never Used  Substance and Sexual Activity   Alcohol use: Yes    Alcohol/week: 4.0 - 6.0 standard drinks  of alcohol    Types: 4 - 6 Shots of liquor per week    Comment: occ   Drug use: Not Currently    Frequency: 2.0 times per week    Types: Marijuana    Comment: states she does not use marijuana any more   Sexual activity: Yes    Birth control/protection: None    Comment: declined insurance questions,des neg  Other Topics Concern   Not on file  Social History Narrative   Left handed    disability   Social Drivers of Health   Financial Resource Strain: Not on file  Food Insecurity: At Risk (07/13/2024)   Received from [employer] Insecurity    Within the past 12 months, the food you bought just didn't last and you didn't have enough money to get more.: 2  Transportation Needs: Not at Risk (07/13/2024)   Received from [employer] Needs    In the past 12 months, has lack of transportation kept you from medical appointments, meetings, work or from getting things needed for daily living? (Check all that apply): 1  Physical Activity: Not on file  Stress: Not on file  Social Connections: Unknown (04/21/2022)   Received from [facility]   Social Network    Social Network: Not on file   Family History  Problem Relation Age of Onset   Hypertension Mother    Diabetes Mother    Breast cancer Mother 32   Hypertension Father    Lung cancer Maternal Aunt    Colon polyps Brother    Colon cancer Neg Hx    Esophageal cancer Neg Hx    Rectal cancer Neg Hx    Stomach cancer Neg Hx    Allergies  Allergen Reactions   Cymbalta  [Duloxetine  Hcl] Nausea Only   Escitalopram      Made her feel out of it.   Gabapentin  Swelling   Lipitor [Atorvastatin ] Other (See Comments)    Stabbing pains in legs   Pravachol  [Pravastatin ]     Sharp pains in LT leg   Repatha  [Evolocumab ] Other (See Comments)    Nausea, GI upset   Zoloft  [Sertraline  Hcl] Other (See Comments)    tremors   Current Outpatient Medications  Medication Sig Dispense Refill   aspirin  EC 325 MG tablet Take 1 tablet  (325 mg total) by mouth daily. 14 tablet 0   empagliflozin (JARDIANCE) 25 MG TABS tablet Take by mouth daily.     esomeprazole  (NEXIUM ) 40 MG capsule TAKE 1 CAPSULE BY MOUTH EVERY DAY 90 capsule 0   hydrochlorothiazide  (HYDRODIURIL ) 25 MG tablet TAKE 1 TABLET BY MOUTH EVERY DAY 90 tablet 0   loratadine  (CLARITIN ) 10 MG tablet TAKE 1 TABLET BY MOUTH EVERY DAY AS NEEDED FOR ALLERGY 90 tablet 1   losartan  (COZAAR ) 100 MG tablet TAKE 1 TABLET BY MOUTH EVERY DAY 90 tablet 0   Naloxegol Oxalate (MOVANTIK  PO) Take by mouth.     nitroGLYCERIN  (NITROSTAT ) 0.4 MG SL tablet Place 1 tablet (0.4 mg total) under the tongue every 5 (five) minutes as needed. 25 tablet 11   oxyCODONE -acetaminophen  (PERCOCET) 10-325 MG tablet Take 1 tablet by mouth every 6 (six) hours.     pregabalin (LYRICA) 75 MG capsule Take 75 mg by mouth 2 (two) times daily.     propranolol  (INDERAL ) 10 MG tablet TAKE 1 TABLET BY MOUTH TWICE A DAY 180 tablet 0   No current facility-administered medications for this visit.   No results found.  Review of Systems:   A ROS was performed including pertinent positives and negatives as documented in the HPI.   Musculoskeletal Exam:    Last menstrual period 10/10/2019.  Right shoulder incisions are well-appearing without erythema or drainage.  Active forward elevation is 250 degrees bilaterally.  External rotation at side is 50 degrees bilaterally internal rotation deferred today distal neurosensory exam is intact  Imaging:      I personally reviewed and interpreted the radiographs.   Assessment:   Status post right shoulder biceps tenodesis acromioplasty overall doing extremely well.  At this time I will plan to see her back in 4 weeks for reassessment.  Plan :    - Clinic 4 weeks for reassessment      I personally saw and evaluated the patient, and participated in the management and treatment plan.  Elspeth Parker, MD Attending Physician, Orthopedic Surgery  This  document was dictated using Dragon voice recognition software. A reasonable attempt at proof reading has been made to minimize errors.

## 2024-07-25 NOTE — Therapy (Deleted)
 OUTPATIENT PHYSICAL THERAPY SHOULDER EVALUATION   Patient Name: Sarah Randall MRN: [id number] DOB:Jan 29, 1966, 58 y.o., female Today's Date: 07/25/2024   END OF SESSION:   Past Medical History:  Diagnosis Date   Anemia    Anxiety    Arthritis    B12 deficiency 06/30/2019   Depression    GERD (gastroesophageal reflux disease)    occasionally takes OTC   History of leukocytosis    Hyperlipidemia    Hypertension    Low back pain    Obesity    Pain in right buttock    Goes down to Right leg to lateral aspect of calf.   Pre-diabetes    Sleep apnea    tested 2014 - unable to tolerate machine   Spondylolisthesis at L4-L5 level    Past Surgical History:  Procedure Laterality Date   ABDOMINAL EXPOSURE N/A 01/22/2021   Procedure: LATERAL EXPOSURE FOR OBLIQUE LATERAL INTERBODY FUSION;  Surgeon: Gretta Lonni PARAS, MD;  Location: [facility] OR;  Service: Vascular;  Laterality: N/A;   ABDOMINAL HYSTERECTOMY     ANTERIOR LUMBAR FUSION N/A 01/22/2021   Procedure: Oblique Lateral Interbody Fusion Lumbar Four-Lumbar Five;  Surgeon: Debby Dorn MATSU, MD;  Location: [facility] OR;  Service: Neurosurgery;  Laterality: N/A;   BICEPT TENODESIS Right 07/10/2024   Procedure: TENODESIS, BICEPS;  Surgeon: Genelle Standing, MD;  Location: [facility];  Service: Orthopedics;  Laterality: Right;   KNEE ARTHROSCOPY WITH MEDIAL MENISECTOMY Left 08/19/2022   Procedure: LEFT KNEE ARTHROSCOPY WITH PARTIAL MEDIAL MENISECTOMY;  Surgeon: Jerri Kay CHRISTELLA, MD;  Location: [facility];  Service: Orthopedics;  Laterality: Left;   LEFT HEART CATH AND CORONARY ANGIOGRAPHY N/A 10/25/2018   Procedure: LEFT HEART CATH AND CORONARY ANGIOGRAPHY;  Surgeon: Swaziland, Peter M, MD;  Location: [facility] INVASIVE CV LAB;  Service: Cardiovascular;  Laterality: N/A;   LUMBAR PERCUTANEOUS PEDICLE SCREW 1 LEVEL N/A 01/22/2021   Procedure: LUMBAR PERCUTANEOUS PEDICLE SCREW LUBAR FOUR- FIVE;  Surgeon: Debby Dorn MATSU, MD;   [facility] OR;  Service: Neurosurgery;  Laterality: N/A;   POSTERIOR LUMBAR FUSION 2 WITH HARDWARE REMOVAL Right 07/10/2024   Procedure: ARTHROSCOPY, SHOULDER WITH DEBRIDEMENT;  Surgeon: Genelle Standing, MD;  Location: [facility];  Service: Orthopedics;  Laterality: Right;  RIGHT SHOULDER ARTHROSCOPY WITH ACROMIOPLASTY AND BICEPS TENODESIS   ROBOTIC ASSISTED LAPAROSCOPIC HYSTERECTOMY AND SALPINGECTOMY Bilateral 12/27/2019   Procedure: XI ROBOTIC ASSISTED TOTAL LAPAROSCOPIC HYSTERECTOMY AND SALPINGECTOMY;  Surgeon: Lavoie, Marie-Lyne, MD;  Location: [facility];  Service: Gynecology;  Laterality: Bilateral;   SYNOVECTOMY Left 08/19/2022   Procedure: LEFT KNEE MAJOR SYNOVECTOMY;  Surgeon: Jerri Kay CHRISTELLA, MD;  Location: [facility];  Service: Orthopedics;  Laterality: Left;   TOTAL HIP ARTHROPLASTY Left 09/22/2017   Procedure: LEFT TOTAL HIP ARTHROPLASTY ANTERIOR APPROACH;  Surgeon: Jerri Kay CHRISTELLA, MD;  Location: [facility] OR;  Service: Orthopedics;  Laterality: Left;   TOTAL KNEE ARTHROPLASTY Right 01/12/2018   Procedure: RIGHT TOTAL KNEE ARTHROPLASTY;  Surgeon: Jerri Kay CHRISTELLA, MD;  Location: [facility] OR;  Service: Orthopedics;  Laterality: Right;   TUBAL LIGATION     Patient Active Problem List   Diagnosis Date Noted   Impingement syndrome of right shoulder 07/10/2024   Pigmented villonodular synovitis of left knee 08/19/2022   Acute medial meniscus tear of left knee 07/21/2022   Recurrent sinusitis 01/27/2022   Trigger index finger of right hand 01/27/2022   Acute otitis media 12/23/2021   Multiple joint pain 02/10/2021  Positive ANA (antinuclear antibody) 12/13/2020   Trigger finger of left thumb 12/10/2020   Lumbar radiculopathy 11/07/2020   Chronic right-sided low back pain with right-sided sciatica 10/10/2020   Sleep disturbance 10/10/2020   Myalgia 10/10/2020   Super obese 10/10/2020   Abnormality of gait 10/10/2020   Statin intolerance 09/17/2020    Recurrent boils 09/17/2020   Chronic bilateral low back pain 05/16/2020   Postoperative state 12/27/2019   Iron  deficiency 08/03/2019   B12 deficiency 06/30/2019   Chronic right shoulder pain 07/05/2018   Trochanteric bursitis of left hip 07/05/2018   Morbid obesity (HCC) 07/05/2018   Body mass index 45.0-49.9, adult (HCC) 07/05/2018   Marijuana user 05/26/2018   Status post total right knee replacement 01/12/2018   Status post total hip replacement, left 09/22/2017   Hyperlipidemia 08/10/2017   Mild depression 06/25/2017   Essential hypertension 06/25/2017   Tobacco abuse 06/25/2017   Prediabetes 06/25/2017   OSA on CPAP 06/25/2017   Bronchitis 09/24/2016   Leukocytosis 09/24/2016   Radiculopathy, lumbar region 07/16/2016   Degenerative lumbar disc 07/03/2016   Synovitis of hip 04/20/2016   Screening for breast cancer 12/28/2014   Type 2 diabetes mellitus with obesity (HCC) 04/06/2014   Pain in joint involving lower leg 05/13/2013    PCP: Elizbeth Leita Ruth, FNP   REFERRING PROVIDER: Genelle Standing, MD   REFERRING DIAG: M75.21 (ICD-10-CM) - Biceps tendonitis, right  THERAPY DIAG:  No diagnosis found.  RATIONALE FOR EVALUATION AND TREATMENT: Rehabilitation  ONSET DATE: Surgery 07/10/24  NEXT MD VISIT: 07/21/24   SUBJECTIVE:                                                                                                                                                                                                         SUBJECTIVE STATEMENT: ***Patient is referred to PT from Dr Genelle following a R shoulder bicep tenodesis and SAD on 07/10/24.  Her original referral was for her to come to PT post op day 3-5, but she did not keep her appointment.  She f/u with Dr Genelle last week  PAIN: Are you having pain? Yes: NPRS scale:   Pain location: R shoulder Pain description: *** Aggravating factors: *** Relieving factors: ***  PERTINENT HISTORY:  L THA, R TKA, back  surgery/fusion, HTN, OA, multi joint pain, Diabetes, obesity  PRECAUTIONS: Shoulder  RED FLAGS: {PT Red Flags:29287}  HAND DOMINANCE: {Hand Dominance:29389}  WEIGHT BEARING RESTRICTIONS: {Yes ***/No:24003}  FALLS:  Has patient fallen in last 6 months? {fallsyesno:27318}  LIVING ENVIRONMENT: Lives with: {OPRC lives tpuy:74430::opczd  with their family} Lives in: {Lives in:25570} Stairs: {opstairs:27293} Has following equipment at home: {Assistive devices:23999}  OCCUPATION: ***  PLOF: {PLOF:24004}  PATIENT GOALS: ***   OBJECTIVE: (objective measures completed at initial evaluation unless otherwise dated)  DIAGNOSTIC FINDINGS:  ***  PATIENT SURVEYS:  {rehab surveys:24030:a}Quick Dash =   COGNITION: Overall cognitive status: {cognition:24006}     SENSATION: {sensation:27233}  POSTURE: {posture:25561}  UPPER EXTREMITY ROM:   {AROM/PROM:27142} ROM Right eval Left eval  Shoulder flexion    Shoulder extension    Shoulder abduction    Shoulder adduction    Shoulder internal rotation    Shoulder external rotation    Elbow flexion    Elbow extension    Wrist flexion    Wrist extension    Wrist ulnar deviation    Wrist radial deviation    Wrist pronation    Wrist supination    (Blank rows = not tested)  UPPER EXTREMITY MMT:  MMT Right eval Left eval  Shoulder flexion    Shoulder extension    Shoulder abduction    Shoulder adduction    Shoulder internal rotation    Shoulder external rotation    Middle trapezius    Lower trapezius    Elbow flexion    Elbow extension    Wrist flexion    Wrist extension    Wrist ulnar deviation    Wrist radial deviation    Wrist pronation    Wrist supination    Grip strength (lbs)    (Blank rows = not tested)  SHOULDER SPECIAL TESTS: Impingement tests: {shoulder impingement test:25231:a} SLAP lesions: {SLAP lesions:25232} Instability tests: {shoulder instability test:25233} Rotator cuff assessment: {rotator  cuff assessment:25234} Biceps assessment: {biceps assessment:25235}  JOINT MOBILITY TESTING:  ***  PALPATION:  ***    TODAY'S TREATMENT:   ***   PATIENT EDUCATION:  Education details: PT eval findings, anticipated POC, and initial HEP  Person educated: Patient Education method: Programmer, multimedia, Demonstration, Verbal cues, Tactile cues, and Handouts Education comprehension: verbalized understanding, verbal cues required, tactile cues required, and needs further education  HOME EXERCISE PROGRAM: ***   ASSESSMENT:  CLINICAL IMPRESSION: ELIJAH MICHAELIS is a 58 y.o. female who was referred to physical therapy for evaluation and treatment for R shoulder arthroscopy 07/10/24 for bicep tenodesis and SAD.    Patient reports onset of *** pain beginning ***. Pain is worse with ***.  Patient has deficits in *** ROM, *** flexibility, *** strength, ***abnormal posture, and TTP with abnormal muscle tension *** which are interfering with ADLs and are impacting quality of life.  On QuickDASH patient scored ***/100 demonstrating ***% disability.  Marlita will benefit from skilled PT to address above deficits to improve mobility and activity tolerance with decreased pain interference.   OBJECTIVE IMPAIRMENTS: {opptimpairments:25111}.   ACTIVITY LIMITATIONS: {activitylimitations:27494}  PARTICIPATION LIMITATIONS: {participationrestrictions:25113}  PERSONAL FACTORS: 1-2 comorbidities:  L THA, R TKA, back surgery/fusion, HTN, OA, multi joint pain, Diabetes, obesity  are also affecting patient's functional outcome.   REHAB POTENTIAL: Good  CLINICAL DECISION MAKING: Evolving/moderate complexity  EVALUATION COMPLEXITY: Moderate   GOALS: Goals reviewed with patient? Yes  SHORT TERM GOALS: Target date: 08/10/2024  Patient will be independent with initial HEP to improve outcomes and carryover.  Baseline: *** Goal status: INITIAL  2.  Patient will report 25% improvement in R shoulder pain to improve  QOL.   Baseline: *** Goal status: INITIAL  3.  *** Baseline: *** Goal status: {GOALSTATUS:25110}  LONG TERM GOALS: Target date: 09/07/2024  Patient will be independent with ongoing/advanced HEP for self-management at home.  Baseline: 100% PT assist required for correct completion Goal status: INITIAL  2.  Patient will report 50-75% improvement in R shoulder pain to improve QOL.  Baseline: *** Goal status: INITIAL  3.  Patient to demonstrate improved upright posture with posterior shoulder girdle engaged to promote improved glenohumeral joint mobility. Baseline: *** Goal status: INITIAL  4.  Patient to improve R shoulder AROM to [facility] without pain provocation to allow for increased ease of ADLs.  Baseline: Refer to above UE ROM table Goal status: INITIAL  5.  Patient will demonstrate improved R shoulder strength to >/= 5/5 for functional UE use. Baseline: Refer to above UE MMT table Goal status: INITIAL  6  Patient will report </= ***% on QuickDASH (MCID = 14%) to demonstrate improved functional ability.  Baseline: *** Goal status: INITIAL  7.  Patient will ***   Baseline: *** Goal status: {GOALSTATUS:25110}   8. *** Baseline: *** Goal status: {GOALSTATUS:25110}   PLAN:  PT FREQUENCY: 1-2x/week  PT DURATION: 8 weeks  PLANNED INTERVENTIONS: 97164- PT Re-evaluation, 97750- Physical Performance Testing, 97110-Therapeutic exercises, 97530- Therapeutic activity, [id number]- Neuromuscular re-education, 97535- Self Care, 02859- Manual therapy, G0283- Electrical stimulation (unattended), [id number]- Electrical stimulation (manual), [id number]- Vasopneumatic device, [id number]- Ultrasound, [id number]- Traction (mechanical), [id number]- Ionotophoresis 4mg /ml Dexamethasone , 79439 (1-2 muscles), 20561 (3+ muscles)- Dry Needling, Patient/Family education, Taping, Joint mobilization, Spinal mobilization, Cryotherapy, and Moist heat  PLAN FOR NEXT SESSION: PIERRETTE RED SENIOR, PT 07/25/2024, 9:08 PM

## 2024-07-26 ENCOUNTER — Ambulatory Visit: Attending: Orthopaedic Surgery | Admitting: Rehabilitation

## 2024-07-26 ENCOUNTER — Ambulatory Visit: Admitting: Orthopedic Surgery

## 2024-08-16 ENCOUNTER — Ambulatory Visit: Admitting: Physical Medicine and Rehabilitation

## 2024-08-16 ENCOUNTER — Other Ambulatory Visit: Payer: Self-pay

## 2024-08-16 VITALS — BP 127/91

## 2024-08-16 DIAGNOSIS — M5416 Radiculopathy, lumbar region: Secondary | ICD-10-CM

## 2024-08-16 MED ORDER — METHYLPREDNISOLONE ACETATE 40 MG/ML IJ SUSP
40.0000 mg | Freq: Once | INTRAMUSCULAR | Status: AC
  Administered 2024-08-16: 40 mg

## 2024-08-16 NOTE — Progress Notes (Signed)
 Pain Scale   Average Pain 8 Patient advising she had chronic lower back pain radiating to right leg and pain is constant        +Driver, -BT, -Dye Allergies.

## 2024-08-16 NOTE — Progress Notes (Signed)
 Sarah Randall - 58 y.o. female MRN [id number]  Date of birth: 1966/01/18  Office Visit Note: Visit Date: 08/16/2024 PCP: Elizbeth Leita Ruth, FNP Referred by: Georgina Ozell LABOR, MD  Subjective: Chief Complaint  Patient presents with   Lower Back - Pain   HPI:  Sarah Randall is a 58 y.o. female who comes in today at the request of Dr. Ozell Georgina for planned Right L5-S1 Lumbar Transforaminal epidural steroid injection with fluoroscopic guidance.  The patient has failed conservative care including home exercise, medications, time and activity modification.  This injection will be diagnostic and hopefully therapeutic.  Please see requesting physician notes for further details and justification.   ROS Otherwise per HPI.  Assessment & Plan: Visit Diagnoses:    ICD-10-CM   1. Lumbar radiculopathy  M54.16 XR C-ARM NO REPORT    Epidural Steroid injection    methylPREDNISolone  acetate (DEPO-MEDROL ) injection 40 mg      Plan: No additional findings.   Meds & Orders:  Meds ordered this encounter  Medications   methylPREDNISolone  acetate (DEPO-MEDROL ) injection 40 mg    Orders Placed This Encounter  Procedures   XR C-ARM NO REPORT   Epidural Steroid injection    Follow-up: Return for visit to requesting provider as needed.   Procedures: No procedures performed  Lumbosacral Transforaminal Epidural Steroid Injection - Sub-Pedicular Approach with Fluoroscopic Guidance  Patient: Sarah Randall      Date of Birth: 1966-12-01 MRN: [id number] PCP: Elizbeth Leita Ruth, FNP      Visit Date: 08/16/2024   Universal Protocol:    Date/Time: 08/16/2024  Consent Given By: the patient  Position: PRONE  Additional Comments: Vital signs were monitored before and after the procedure. Patient was prepped and draped in the usual sterile fashion. The correct patient, procedure, and site was verified.   Injection Procedure Details:   Procedure diagnoses: Lumbar radiculopathy  [M54.16]    Meds Administered:  Meds ordered this encounter  Medications   methylPREDNISolone  acetate (DEPO-MEDROL ) injection 40 mg    Laterality: Right  Location/Site: L5  Needle:6.0 in., 22 ga.  Short bevel or Quincke spinal needle  Needle Placement: Transforaminal  Findings:    -Comments: Excellent flow of contrast along the nerve, nerve root and into the epidural space.  Procedure Details: After squaring off the end-plates to get a true AP view, the C-arm was positioned so that an oblique view of the foramen as noted above was visualized. The target area is just inferior to the nose of the scotty dog or sub pedicular. The soft tissues overlying this structure were infiltrated with 2-3 ml. of 1% Lidocaine  without Epinephrine .  The spinal needle was inserted toward the target using a trajectory view along the fluoroscope beam.  Under AP and lateral visualization, the needle was advanced so it did not puncture dura and was located close the 6 O'Clock position of the pedical in AP tracterory. Biplanar projections were used to confirm position. Aspiration was confirmed to be negative for CSF and/or blood. A 1-2 ml. volume of Isovue-250 was injected and flow of contrast was noted at each level. Radiographs were obtained for documentation purposes.   After attaining the desired flow of contrast documented above, a 0.5 to 1.0 ml test dose of 0.25% Marcaine  was injected into each respective transforaminal space.  The patient was observed for 90 seconds post injection.  After no sensory deficits were reported, and normal lower extremity motor function was noted,   the above injectate  was administered so that equal amounts of the injectate were placed at each foramen (level) into the transforaminal epidural space.   Additional Comments:  No complications occurred Dressing: 2 x 2 sterile gauze and Band-Aid    Post-procedure details: Patient was observed during the  procedure. Post-procedure instructions were reviewed.  Patient left the clinic in stable condition.    Clinical History: CLINICAL DATA:  Lumbar radiculopathy, symptoms persist with > 6 wks treatment   Low back pain with right leg numbness and burning for 1 year. Previous lumbar fusion with revision.   EXAM: MRI LUMBAR SPINE WITHOUT CONTRAST   TECHNIQUE: Multiplanar, multisequence MR imaging of the lumbar spine was performed. No intravenous contrast was administered.   COMPARISON:  Radiograph 06/21/2024.  MRI 01/15/2023.   FINDINGS: Segmentation: Conventional anatomy assumed, with the last open disc space designated L5-S1.Concordant with prior imaging.   Alignment:  Physiologic.   Vertebrae: No worrisome osseous lesion, acute fracture or pars defect. Status post L4-5 PLIF with evidence for solid interbody fusion. Chronic Modic 2 changes at T11-12.   Conus medullaris: Extends to the L1-2 level. The conus and cauda equina appear normal.   Paraspinal and other soft tissues: No significant paraspinal findings. Stable asymmetric atrophy of the left psoas muscle and dependent subcutaneous edema bilaterally.   Disc levels:   Sagittal images demonstrate chronic loss of disc height with annular disc bulging and endplate osteophytes at T11-12. No resulting cord deformity or significant foraminal narrowing.   Preserved disc height from T12-L1 through L2-3. Mild facet hypertrophy but no spinal stenosis, foraminal narrowing or nerve root impingement.   L3-4: Mildly progressive loss of disc height with annular disc bulging and a shallow central disc protrusion. Moderate facet and ligamentous hypertrophy. Progressive spinal stenosis, now moderate. There is mild lateral recess narrowing, left greater than right. The foramina remain sufficiently patent.   L4-5: The spinal canal and foramina remain widely patent post L4-5 PLIF.   L5-S1: Enlarging, moderate-sized right  paracentral disc extrusion with mass effect on the thecal sac and right S1 nerve root impingement in the canal. Mild bilateral facet hypertrophy. Mild left foraminal narrowing appears similar to the previous study. The left lateral recess and right foramen are patent.   IMPRESSION: 1. Enlarging, moderate-sized right paracentral disc extrusion at L5-S1 with mass effect on the thecal sac and right S1 nerve root impingement in the canal. 2. Progressive adjacent segment disease at L3-4 with resulting moderate spinal stenosis and mild lateral recess narrowing, left greater than right. 3. Stable postoperative changes at L4-5 with widely patent spinal canal and foramina.     Electronically Signed   By: Elsie Perone M.D.   On: 06/25/2024 15:15     Objective:  VS:  HT:    WT:   BMI:     BP:(!) 127/91  HR: bpm  TEMP: ( )  RESP:  Physical Exam Vitals and nursing note reviewed.  Constitutional:      General: She is not in acute distress.    Appearance: Normal appearance. She is not ill-appearing.  HENT:     Head: Normocephalic and atraumatic.     Right Ear: External ear normal.     Left Ear: External ear normal.  Eyes:     Extraocular Movements: Extraocular movements intact.  Cardiovascular:     Rate and Rhythm: Normal rate.     Pulses: Normal pulses.  Pulmonary:     Effort: Pulmonary effort is normal. No respiratory distress.  Abdominal:  General: There is no distension.     Palpations: Abdomen is soft.  Musculoskeletal:        General: Tenderness present.     Cervical back: Neck supple.     Right lower leg: No edema.     Left lower leg: No edema.     Comments: Patient has good distal strength with no pain over the greater trochanters.  No clonus or focal weakness.  Skin:    Findings: No erythema, lesion or rash.  Neurological:     General: No focal deficit present.     Mental Status: She is alert and oriented to person, place, and time.     Sensory: No sensory  deficit.     Motor: No weakness or abnormal muscle tone.     Coordination: Coordination normal.  Psychiatric:        Mood and Affect: Mood normal.        Behavior: Behavior normal.      Imaging: No results found.

## 2024-08-18 ENCOUNTER — Encounter (HOSPITAL_BASED_OUTPATIENT_CLINIC_OR_DEPARTMENT_OTHER): Admitting: Orthopaedic Surgery

## 2024-08-22 NOTE — Procedures (Signed)
 Lumbosacral Transforaminal Epidural Steroid Injection - Sub-Pedicular Approach with Fluoroscopic Guidance  Patient: Sarah Randall      Date of Birth: [birth year] MRN: [id number] PCP: Elizbeth Leita Ruth, FNP      Visit Date: 08/16/2024   Universal Protocol:    Date/Time: 08/16/2024  Consent Given By: the patient  Position: PRONE  Additional Comments: Vital signs were monitored before and after the procedure. Patient was prepped and draped in the usual sterile fashion. The correct patient, procedure, and site was verified.   Injection Procedure Details:   Procedure diagnoses: Lumbar radiculopathy [M54.16]    Meds Administered:  Meds ordered this encounter  Medications   methylPREDNISolone  acetate (DEPO-MEDROL ) injection 40 mg    Laterality: Right  Location/Site: L5  Needle:6.0 in., 22 ga.  Short bevel or Quincke spinal needle  Needle Placement: Transforaminal  Findings:    -Comments: Excellent flow of contrast along the nerve, nerve root and into the epidural space.  Procedure Details: After squaring off the end-plates to get a true AP view, the C-arm was positioned so that an oblique view of the foramen as noted above was visualized. The target area is just inferior to the nose of the scotty dog or sub pedicular. The soft tissues overlying this structure were infiltrated with 2-3 ml. of 1% Lidocaine  without Epinephrine .  The spinal needle was inserted toward the target using a trajectory view along the fluoroscope beam.  Under AP and lateral visualization, the needle was advanced so it did not puncture dura and was located close the 6 O'Clock position of the pedical in AP tracterory. Biplanar projections were used to confirm position. Aspiration was confirmed to be negative for CSF and/or blood. A 1-2 ml. volume of Isovue-250 was injected and flow of contrast was noted at each level. Radiographs were obtained for documentation purposes.   After attaining the  desired flow of contrast documented above, a 0.5 to 1.0 ml test dose of 0.25% Marcaine  was injected into each respective transforaminal space.  The patient was observed for 90 seconds post injection.  After no sensory deficits were reported, and normal lower extremity motor function was noted,   the above injectate was administered so that equal amounts of the injectate were placed at each foramen (level) into the transforaminal epidural space.   Additional Comments:  No complications occurred Dressing: 2 x 2 sterile gauze and Band-Aid    Post-procedure details: Patient was observed during the procedure. Post-procedure instructions were reviewed.  Patient left the clinic in stable condition.

## 2024-08-31 ENCOUNTER — Ambulatory Visit: Admitting: Orthopedic Surgery

## 2024-08-31 ENCOUNTER — Encounter: Payer: Self-pay | Admitting: Internal Medicine

## 2024-08-31 DIAGNOSIS — M5416 Radiculopathy, lumbar region: Secondary | ICD-10-CM

## 2024-08-31 NOTE — Progress Notes (Signed)
 Orthopedic Spine Surgery Office Note   Assessment: Patient is a 58 y.o. female with low back pain that radiates into the lateral aspect of the right thigh and leg.  Has a disc herniation at L5/S1 causing radiculopathy     Plan: -Patient has tried tylenol , aleve , oral steroids, pregabalin, oxycodone , lumbar steroid injection -Patient did get good but not lasting relief with the injection in her lumbar spine so would not repeat this in the future -Patient is still using nicotine  so did not offer any surgical option -Would need to be nicotine  free prior to any elective spine surgery.  Informed her that I would do a test to confirm her nicotine  free status -Patient should return to office in 6 weeks, x-rays at next visit: none     Patient expressed understanding of the plan and all questions were answered to the patient's satisfaction.    ___________________________________________________________________________     History:   Patient is a 58 y.o. female who presents today for follow-up on her lumbar spine.  Patient continues to have significant pain starting in her low back and radiating into the right lower extremity.  She feels it going along the posterolateral aspect of the thigh and leg.  After her last visit, she got an injection with Dr. Eldonna.  She said she was completely pain-free for a couple of days but then pain gradually returned.  She is still better than where she was before the injection but she is only gotten about 40% of relief right now.  She has not developed any new symptoms since she was last seen in the office.   Treatments tried: tylenol , aleve , oral steroids, pregabalin, oxycodone , lumbar steroid injection     Physical Exam:   General: no acute distress, appears stated age Neurologic: alert, answering questions appropriately, following commands Respiratory: unlabored breathing on room air, symmetric chest rise Psychiatric: appropriate affect, normal cadence to  speech     MSK (spine):   -Strength exam                                                   Left                  Right EHL                              5/5                  5/5 TA                                 5/5                  5/5 GSC                             5/5                  5/5 Knee extension            5/5                  5/5 Hip flexion  5/5                  5/5   -Sensory exam                           Sensation intact to light touch in L3-S1 nerve distributions of bilateral lower extremities   Imaging: XRs of the lumbar spine from 06/21/2024 were previously independently reviewed and interpreted, showing interbody device at L4/5 that appears in appropriate position. Posterior instrumentation at L4 and L5 without any lucency around the screws. Disc height loss at L3/4 and L5/S1. No fracture or dislocation seen.    MRI of the lumbar spine from 06/23/2024 shows mild central stenosis L3/4.  Large paracentral disc herniation on the right at L5/S1 abutting the traversing nerve root.      Patient name: Sarah Randall Patient MRN: [id number] Date of visit: 08/31/24

## 2024-09-30 ENCOUNTER — Emergency Department (HOSPITAL_BASED_OUTPATIENT_CLINIC_OR_DEPARTMENT_OTHER)
Admission: EM | Admit: 2024-09-30 | Discharge: 2024-10-01 | Disposition: A | Discharge status: Home / Self Care | Attending: Emergency Medicine | Admitting: Emergency Medicine

## 2024-09-30 ENCOUNTER — Encounter (HOSPITAL_BASED_OUTPATIENT_CLINIC_OR_DEPARTMENT_OTHER): Payer: Self-pay

## 2024-09-30 ENCOUNTER — Other Ambulatory Visit: Payer: Self-pay

## 2024-09-30 DIAGNOSIS — Z7984 Long term (current) use of oral hypoglycemic drugs: Secondary | ICD-10-CM | POA: Insufficient documentation

## 2024-09-30 DIAGNOSIS — E119 Type 2 diabetes mellitus without complications: Secondary | ICD-10-CM | POA: Diagnosis not present

## 2024-09-30 DIAGNOSIS — Z79899 Other long term (current) drug therapy: Secondary | ICD-10-CM | POA: Diagnosis not present

## 2024-09-30 DIAGNOSIS — M5431 Sciatica, right side: Secondary | ICD-10-CM | POA: Insufficient documentation

## 2024-09-30 DIAGNOSIS — Z7982 Long term (current) use of aspirin: Secondary | ICD-10-CM | POA: Diagnosis not present

## 2024-09-30 DIAGNOSIS — F17200 Nicotine dependence, unspecified, uncomplicated: Secondary | ICD-10-CM | POA: Diagnosis not present

## 2024-09-30 DIAGNOSIS — I10 Essential (primary) hypertension: Secondary | ICD-10-CM | POA: Diagnosis not present

## 2024-09-30 NOTE — ED Triage Notes (Signed)
 Pt c/o 10/10 burning pain that goes from lower back down through her R hip and leg due to her known disc herniation. Currently on pain management, but the medications are not working. Also reports tingling in leg.

## 2024-10-01 MED ORDER — HYDROMORPHONE HCL 1 MG/ML IJ SOLN
0.5000 mg | Freq: Once | INTRAMUSCULAR | Status: AC
  Administered 2024-10-01: 0.5 mg via INTRAMUSCULAR
  Filled 2024-10-01: qty 1

## 2024-10-01 MED ORDER — METHOCARBAMOL 500 MG PO TABS: 500.0000 mg | ORAL_TABLET | Freq: Three times a day (TID) | ORAL | 0 refills | Status: DC | PRN

## 2024-10-01 MED ORDER — DEXAMETHASONE SOD PHOSPHATE PF 10 MG/ML IJ SOLN
10.0000 mg | Freq: Once | INTRAMUSCULAR | Status: AC
  Administered 2024-10-01: 10 mg via INTRAMUSCULAR

## 2024-10-01 MED ORDER — METHOCARBAMOL 500 MG PO TABS
1000.0000 mg | ORAL_TABLET | Freq: Once | ORAL | Status: AC
  Administered 2024-10-01: 1000 mg via ORAL
  Filled 2024-10-01: qty 2

## 2024-10-01 MED ORDER — KETOROLAC TROMETHAMINE 60 MG/2ML IM SOLN
30.0000 mg | Freq: Once | INTRAMUSCULAR | Status: AC
  Administered 2024-10-01: 30 mg via INTRAMUSCULAR
  Filled 2024-10-01: qty 2

## 2024-10-01 NOTE — ED Provider Notes (Signed)
 [facility] Provider Note  CSN: [id number] Arrival date & time: 09/30/24 2322  Chief Complaint(s) Leg Pain and Back Pain  HPI Sarah Randall is a 58 y.o. female    The history is provided by the patient.  Leg Pain Location:  Buttock Injury: no   Buttock location:  R buttock Pain details:    Quality:  Burning   Radiates to:  R leg   Severity:  Severe   Onset quality:  Gradual   Duration: months.   Timing:  Constant   Progression:  Waxing and waning Chronicity:  Chronic Relieved by:  Nothing Worsened by:  Bearing weight and activity Associated symptoms: numbness   Associated symptoms: no back pain, no decreased ROM, no muscle weakness, no swelling and no tingling     Past Medical History Past Medical History:  Diagnosis Date   Anemia    Anxiety    Arthritis    B12 deficiency 06/30/2019   Depression    GERD (gastroesophageal reflux disease)    occasionally takes OTC   History of leukocytosis    Hyperlipidemia    Hypertension    Low back pain    Obesity    Pain in right buttock    Goes down to Right leg to lateral aspect of calf.   Pre-diabetes    Sleep apnea    tested 2014 - unable to tolerate machine   Spondylolisthesis at L4-L5 level    Patient Active Problem List   Diagnosis Date Noted   Impingement syndrome of right shoulder 07/10/2024   Pigmented villonodular synovitis of left knee 08/19/2022   Acute medial meniscus tear of left knee 07/21/2022   Recurrent sinusitis 01/27/2022   Trigger index finger of right hand 01/27/2022   Acute otitis media 12/23/2021   Multiple joint pain 02/10/2021   Positive ANA (antinuclear antibody) 12/13/2020   Trigger finger of left thumb 12/10/2020   Lumbar radiculopathy 11/07/2020   Chronic right-sided low back pain with right-sided sciatica 10/10/2020   Sleep disturbance 10/10/2020   Myalgia 10/10/2020   Super obese 10/10/2020   Abnormality of gait 10/10/2020   Statin  intolerance 09/17/2020   Recurrent boils 09/17/2020   Chronic bilateral low back pain 05/16/2020   Postoperative state 12/27/2019   Iron  deficiency 08/03/2019   B12 deficiency 06/30/2019   Chronic right shoulder pain 07/05/2018   Trochanteric bursitis of left hip 07/05/2018   Morbid obesity (HCC) 07/05/2018   Body mass index 45.0-49.9, adult (HCC) 07/05/2018   Marijuana user 05/26/2018   Status post total right knee replacement 01/12/2018   Status post total hip replacement, left 09/22/2017   Hyperlipidemia 08/10/2017   Mild depression 06/25/2017   Essential hypertension 06/25/2017   Tobacco abuse 06/25/2017   Prediabetes 06/25/2017   OSA on CPAP 06/25/2017   Bronchitis 09/24/2016   Leukocytosis 09/24/2016   Radiculopathy, lumbar region 07/16/2016   Degenerative lumbar disc 07/03/2016   Synovitis of hip 04/20/2016   Screening for breast cancer 12/28/2014   Type 2 diabetes mellitus with obesity 04/06/2014   Pain in joint involving lower leg 05/13/2013   Home Medication(s) Prior to Admission medications   Medication Sig Start Date End Date Taking? Authorizing Provider  aspirin  EC 325 MG tablet Take 1 tablet (325 mg total) by mouth daily. 05/10/24   Genelle Standing, MD  empagliflozin (JARDIANCE) 25 MG TABS tablet Take by mouth daily.    [provider]  esomeprazole  (NEXIUM ) 40 MG capsule TAKE 1  CAPSULE BY MOUTH EVERY DAY 05/27/22   Vicci Barnie NOVAK, MD  hydrochlorothiazide  (HYDRODIURIL ) 25 MG tablet TAKE 1 TABLET BY MOUTH EVERY DAY 07/01/22   Vicci Barnie NOVAK, MD  loratadine  (CLARITIN ) 10 MG tablet TAKE 1 TABLET BY MOUTH EVERY DAY AS NEEDED FOR ALLERGY 08/14/21   Vicci Barnie NOVAK, MD  losartan  (COZAAR ) 100 MG tablet TAKE 1 TABLET BY MOUTH EVERY DAY 07/02/22   Vicci Barnie NOVAK, MD  Naloxegol Oxalate (MOVANTIK PO) Take by mouth.    [provider]  nitroGLYCERIN  (NITROSTAT ) 0.4 MG SL tablet Place 1 tablet (0.4 mg total) under the tongue every 5 (five) minutes as  needed. 10/21/18 07/17/22  Revankar, Jennifer SAUNDERS, MD  oxyCODONE -acetaminophen  (PERCOCET) 10-325 MG tablet Take 1 tablet by mouth every 6 (six) hours. 07/13/22   [provider]  pregabalin (LYRICA) 75 MG capsule Take 75 mg by mouth 2 (two) times daily. 06/12/22   [provider]  propranolol  (INDERAL ) 10 MG tablet TAKE 1 TABLET BY MOUTH TWICE A DAY 07/01/22   Vicci Barnie NOVAK, MD                                                                                                                                    Allergies Cymbalta  [duloxetine  hcl], Escitalopram , Gabapentin , Lipitor [atorvastatin ], Pravachol  [pravastatin ], Repatha  [evolocumab ], and Zoloft  [sertraline  hcl]  Review of Systems Review of Systems  Musculoskeletal:  Negative for back pain.   As noted in HPI  Physical Exam Vital Signs  I have reviewed the triage vital signs BP 119/84   Pulse 74   Temp 97.7 F (36.5 C) (Oral)   Resp 18   Ht 5' 4 (1.626 m)   Wt 129.3 kg   LMP 10/10/2019   SpO2 100%   BMI 48.92 kg/m   Physical Exam Vitals reviewed.  Constitutional:      General: She is not in acute distress.    Appearance: She is well-developed. She is obese. She is not diaphoretic.  HENT:     Head: Normocephalic and atraumatic.     Right Ear: External ear normal.     Left Ear: External ear normal.     Nose: Nose normal.  Eyes:     General: No scleral icterus.    Conjunctiva/sclera: Conjunctivae normal.  Neck:     Trachea: Phonation normal.  Cardiovascular:     Rate and Rhythm: Normal rate and regular rhythm.  Pulmonary:     Effort: Pulmonary effort is normal. No respiratory distress.     Breath sounds: No stridor.  Abdominal:     General: There is no distension.  Musculoskeletal:        General: Normal range of motion.     Cervical back: Normal range of motion.     Lumbar back: No spasms or tenderness.       Back:     Right hip: Tenderness present.  Comments: +straight leg sign on right   Neurological:     Mental Status: She is alert and oriented to person, place, and time.  Psychiatric:        Behavior: Behavior normal.     ED Results and Treatments Labs (all labs ordered are listed, but only abnormal results are displayed) Labs Reviewed - No data to display                                                                                                                       EKG  EKG Interpretation Date/Time:    Ventricular Rate:    PR Interval:    QRS Duration:    QT Interval:    QTC Calculation:   R Axis:      Text Interpretation:         Radiology No results found.  Medications Ordered in ED Medications  dexamethasone  (DECADRON ) injection 10 mg (10 mg Intramuscular Given 10/01/24 0023)  ketorolac  (TORADOL ) injection 30 mg (30 mg Intramuscular Given 10/01/24 0021)  HYDROmorphone  (DILAUDID ) injection 0.5 mg (0.5 mg Intramuscular Given 10/01/24 0020)  methocarbamol  (ROBAXIN ) tablet 1,000 mg (1,000 mg Oral Given 10/01/24 0019)   Procedures Procedures  (including critical care time) Medical Decision Making / ED Course   Medical Decision Making Risk Prescription drug management.    Right sided sciatica.  Known history of herniated disc. No red flags concerning for cauda equina.  No need for emergent imaging at this time. Provided with IM meds, which did give her significant relief. Ortho follow up as scheduled     Final Clinical Impression(s) / ED Diagnoses Final diagnoses:  Sciatica of right side   The patient appears reasonably screened and/or stabilized for discharge and I doubt any other medical condition or other [facility] requiring further screening, evaluation, or treatment in the ED at this time. I have discussed the findings, Dx and Tx plan with the patient/family who expressed understanding and agree(s) with the plan. Discharge instructions discussed at length. The patient/family was given strict return precautions who verbalized  understanding of the instructions. No further questions at time of discharge.  Disposition: Discharge  Condition: Good  ED Discharge Orders          Ordered    methocarbamol  (ROBAXIN ) 500 MG tablet  Every 8 hours PRN        10/01/24 0230             Follow Up: Georgina Ozell LABOR, MD [address] [place] [address] [phone]  Go to  as scheduled    This chart was dictated using voice recognition software.  Despite best efforts to proofread,  errors can occur which can change the documentation meaning.    Trine Raynell Moder, MD 10/01/24 [phone]

## 2024-10-09 ENCOUNTER — Encounter: Payer: Self-pay | Admitting: Radiology

## 2024-10-12 ENCOUNTER — Ambulatory Visit (INDEPENDENT_AMBULATORY_CARE_PROVIDER_SITE_OTHER): Admitting: Orthopedic Surgery

## 2024-10-12 DIAGNOSIS — M5416 Radiculopathy, lumbar region: Secondary | ICD-10-CM

## 2024-10-12 DIAGNOSIS — Z720 Tobacco use: Secondary | ICD-10-CM

## 2024-10-12 NOTE — Progress Notes (Signed)
 Orthopedic Spine Surgery Office Note   Assessment: Patient is a 58 y.o. female with low back pain that radiates into the lateral aspect of the right thigh and leg.  Has a disc herniation at L5/S1 causing radiculopathy     Plan: -Patient has tried tylenol , aleve , oral steroids, pregabalin, oxycodone , lumbar steroid injection -Patient has tried multiple conservative treatments over the last 3 months without any relief of her pain, so discussed surgery as an option.  She has still not quit nicotine .  I told her that I will order a nicotine  test and as soon as that is negative, we can work on scheduling -Patient would need to be nicotine  free prior any elective spine surgery -Patient will next be seen a date of surgery     The patient has developed symptoms consistent with lumbar radiculopathy. I explained that most of the time, the symptoms related to herniated disc get better with conservative treatments. This patient has tried conservative treatment now for over 3 months without any relief of their symptoms. Accordingly, discussed surgery in the form of microdiscectomy as an option for treatment.  The risks of the surgery including but not limited to recurrent disc herniation, persistent pain, dural tear, nerve root injury, paralysis, infection, bleeding, fracture, instability, dvt/pe, need for additional procedures, blindness, heart attack, and death were discussed with the patient. I explained that her risks are higher due to her morbid obesity particularly infection and wound healing complications. The benefits of the surgery would be faster improvement in her radiating leg pain. I explained that back pain relief is not the goal of the surgery and it is not reliably alleviated with this surgery. The alternatives to surgical management were covered with the patient and included activity modification, physical therapy, over-the-counter pain medications, and injections.  All the patient's questions were  answered to her satisfaction. After this discussion, the patient expressed understanding and elected to proceed with surgical intervention.     ___________________________________________________________________________     History:   Patient is a 58 y.o. female who presents today for follow-up on her lumbar spine.  Patient has noticed worsening of her pain since she was last seen in the office.  She is having pain going from her low back into the posterolateral thigh and leg.  She notes this symptom on the right side.  She has also developed numbness over the lateral aspect of her right leg and dorsal foot.  She has no left sided symptoms.  She has not quit nicotine  containing products.  She says that she has been working on that.  She has now had symptoms for over 3 months and has tried multiple treatments as listed below.   Treatments tried: tylenol , aleve , oral steroids, pregabalin, oxycodone , lumbar steroid injection     Physical Exam:   General: no acute distress, appears stated age, tearful Neurologic: alert, answering questions appropriately, following commands Respiratory: unlabored breathing on room air, symmetric chest rise Psychiatric: appropriate affect, normal cadence to speech, frustrated because other doctors have not made her quit nicotine  prior to surgery     MSK (spine):   -Strength exam                                                   Left  Right EHL                              5/5                  5/5 TA                                 5/5                  5/5 GSC                             5/5                  5/5 Knee extension            5/5                  5/5 Hip flexion                    5/5                  5/5   -Sensory exam                           Sensation intact to light touch in L3-S1 nerve distributions of bilateral lower extremities   Imaging: XRs of the lumbar spine from 06/21/2024 were previously independently reviewed  and interpreted, showing interbody device at L4/5 that appears in appropriate position. Posterior instrumentation at L4 and L5 without any lucency around the screws. Disc height loss at L3/4 and L5/S1. No fracture or dislocation seen.    MRI of the lumbar spine from 06/23/2024 shows mild central stenosis L3/4.  Large paracentral disc herniation on the right at L5/S1 abutting the traversing nerve root.      Patient name: Sarah Randall Patient MRN: [id number] Date of visit: 10/12/24

## 2024-10-18 ENCOUNTER — Encounter: Payer: Self-pay | Admitting: Orthopedic Surgery

## 2024-10-19 ENCOUNTER — Encounter: Payer: Self-pay | Admitting: Radiology

## 2024-10-23 ENCOUNTER — Encounter: Payer: Self-pay | Admitting: Internal Medicine

## 2024-10-25 LAB — NICOTINE SCREEN, URINE: Cotinine Ql Scrn, Ur: NEGATIVE ng/mL

## 2024-10-27 NOTE — Telephone Encounter (Signed)
 I spoke with the patient on 11/17 and scheduled her for surgery on 11/10/24.

## 2024-11-06 NOTE — Pre-Procedure Instructions (Signed)
 Surgical Instructions   Your procedure is scheduled on November 10, 2024. Report to [facility] Main Entrance A at 5:30 A.M., then check in with the Admitting office. Any questions or running late day of surgery: call [phone]  Questions prior to your surgery date: call [phone], Monday-Friday, 8am-4pm. If you experience any cold or flu symptoms such as cough, fever, chills, shortness of breath, etc. between now and your scheduled surgery, please notify us  at the above number.     Remember:  Do not eat after midnight the night before your surgery  You may drink clear liquids until 4:30AM the morning of your surgery.   Clear liquids allowed are: Water, Non-Citrus Juices (without pulp), Carbonated Beverages, Clear Tea (no milk, honey, etc.), Black Coffee Only (NO MILK, CREAM OR POWDERED CREAMER of any kind), and Gatorade.    Take these medicines the morning of surgery with A SIP OF WATER: esomeprazole  (NEXIUM )  pregabalin (LYRICA)  propranolol  (INDERAL )   May take these medicines IF NEEDED: loratadine  (CLARITIN )  nitroGLYCERIN  (NITROSTAT ) 0.4 MG SL tablet  ondansetron  (ZOFRAN -ODT)  oxyCODONE -acetaminophen  (PERCOCET)  SYMBICORT 160-4.5 MCG/ACT inhaler - please bring inhaler to the hospital with you  One week prior to surgery, STOP taking any Aspirin  (unless otherwise instructed by your surgeon) Aleve , Naproxen , Ibuprofen , Motrin , Advil , Goody's, BC's, all herbal medications, fish oil, and non-prescription vitamins.                     Do NOT Smoke (Tobacco/Vaping) for 24 hours prior to your procedure.  If you use a CPAP at night, you may bring your mask/headgear for your overnight stay.   You will be asked to remove any contacts, glasses, piercing's, hearing aid's, dentures/partials prior to surgery. Please bring cases for these items if needed.    Patients discharged the day of surgery will not be allowed to drive home, and someone needs to stay with them for 24  hours.  SURGICAL WAITING ROOM VISITATION Patients may have no more than 2 support people in the waiting area - these visitors may rotate.   Pre-op nurse will coordinate an appropriate time for 1 ADULT support person, who may not rotate, to accompany patient in pre-op.  Children under the age of 73 must have an adult with them who is not the patient and must remain in the main waiting area with an adult.  If the patient needs to stay at the hospital during part of their recovery, the visitor guidelines for inpatient rooms apply.  Please refer to the [facility] website for the visitor guidelines for any additional information.   If you received a COVID test during your pre-op visit  it is requested that you wear a mask when out in public, stay away from anyone that may not be feeling well and notify your surgeon if you develop symptoms. If you have been in contact with anyone that has tested positive in the last 10 days please notify you surgeon.      Pre-operative 4 CHG Bathing Instructions   You can play a key role in reducing the risk of infection after surgery. Your skin needs to be as free of germs as possible. You can reduce the number of germs on your skin by washing with CHG (chlorhexidine  gluconate) soap before surgery. CHG is an antiseptic soap that kills germs and continues to kill germs even after washing.   DO NOT use if you have an allergy to chlorhexidine /CHG or antibacterial soaps. If your  skin becomes reddened or irritated, stop using the CHG and notify one of our RNs at [phone].   Please shower with the CHG soap starting 4 days before surgery using the following schedule:     Please keep in mind the following:  DO NOT shave, including legs and underarms, starting the day of your first shower.   You may shave your face at any point before/day of surgery.  Place clean sheets on your bed the day you start using CHG soap. Use a clean washcloth (not used since being  washed) for each shower. DO NOT sleep with pets once you start using the CHG.   CHG Shower Instructions:  Wash your face and private area with normal soap. If you choose to wash your hair, wash first with your normal shampoo.  After you use shampoo/soap, rinse your hair and body thoroughly to remove shampoo/soap residue.  Turn the water OFF and apply  bottle of CHG soap to a CLEAN washcloth.  Apply CHG soap ONLY FROM YOUR NECK DOWN TO YOUR TOES (washing for 3-5 minutes)  DO NOT use CHG soap on face, private areas, open wounds, or sores.  Pay special attention to the area where your surgery is being performed.  If you are having back surgery, having someone wash your back for you may be helpful. Wait 2 minutes after CHG soap is applied, then you may rinse off the CHG soap.  Pat dry with a clean towel  Put on clean clothes/pajamas   If you choose to wear lotion, please use ONLY the CHG-compatible lotions that are listed below.  Additional instructions for the day of surgery:  If you choose, you may shower the morning of surgery with an antibacterial soap.  DO NOT APPLY any lotions, deodorants, cologne, or perfumes.   Do not bring valuables to the hospital. [facility] is not responsible for any belongings/valuables. Do not wear nail polish, gel polish, artificial nails, or any other type of covering on natural nails (fingers and toes) Do not wear jewelry or makeup Put on clean/comfortable clothes.  Please brush your teeth.  Ask your nurse before applying any prescription medications to the skin.     CHG Compatible Lotions   Aveeno Moisturizing lotion  Cetaphil Moisturizing Cream  Cetaphil Moisturizing Lotion  Clairol Herbal Essence Moisturizing Lotion, Dry Skin  Clairol Herbal Essence Moisturizing Lotion, Extra Dry Skin  Clairol Herbal Essence Moisturizing Lotion, Normal Skin  Curel Age Defying Therapeutic Moisturizing Lotion with Alpha Hydroxy  Curel Extreme Care Body Lotion   Curel Soothing Hands Moisturizing Hand Lotion  Curel Therapeutic Moisturizing Cream, Fragrance-Free  Curel Therapeutic Moisturizing Lotion, Fragrance-Free  Curel Therapeutic Moisturizing Lotion, Original Formula  Eucerin Daily Replenishing Lotion  Eucerin Dry Skin Therapy Plus Alpha Hydroxy Crme  Eucerin Dry Skin Therapy Plus Alpha Hydroxy Lotion  Eucerin Original Crme  Eucerin Original Lotion  Eucerin Plus Crme Eucerin Plus Lotion  Eucerin TriLipid Replenishing Lotion  Keri Anti-Bacterial Hand Lotion  Keri Deep Conditioning Original Lotion Dry Skin Formula Softly Scented  Keri Deep Conditioning Original Lotion, Fragrance Free Sensitive Skin Formula  Keri Lotion Fast Absorbing Fragrance Free Sensitive Skin Formula  Keri Lotion Fast Absorbing Softly Scented Dry Skin Formula  Keri Original Lotion  Keri Skin Renewal Lotion Keri Silky Smooth Lotion  Keri Silky Smooth Sensitive Skin Lotion  Nivea Body Creamy Conditioning Oil  Nivea Body Extra Enriched Teacher, Adult [employer] Moisturizing Lotion Nivea Crme  Nivea Skin Firming  Lotion  NutraDerm 30 Skin Lotion  NutraDerm Skin Lotion  NutraDerm Therapeutic Skin Cream  NutraDerm Therapeutic Skin Lotion  ProShield Protective Hand Cream  Provon moisturizing lotion  Please read over the following fact sheets that you were given.

## 2024-11-07 ENCOUNTER — Inpatient Hospital Stay (HOSPITAL_COMMUNITY)
Admission: RE | Admit: 2024-11-07 | Discharge: 2024-11-07 | Disposition: A | Source: Ambulatory Visit | Attending: Orthopedic Surgery

## 2024-11-07 ENCOUNTER — Encounter (HOSPITAL_COMMUNITY): Payer: Self-pay

## 2024-11-07 ENCOUNTER — Other Ambulatory Visit: Payer: Self-pay

## 2024-11-07 VITALS — BP 123/85 | HR 84 | Temp 98.2°F | Resp 16 | Ht 64.0 in | Wt 293.0 lb

## 2024-11-07 DIAGNOSIS — Z01818 Encounter for other preprocedural examination: Secondary | ICD-10-CM

## 2024-11-07 DIAGNOSIS — E119 Type 2 diabetes mellitus without complications: Secondary | ICD-10-CM | POA: Insufficient documentation

## 2024-11-07 DIAGNOSIS — Z01812 Encounter for preprocedural laboratory examination: Secondary | ICD-10-CM | POA: Insufficient documentation

## 2024-11-07 LAB — BASIC METABOLIC PANEL WITH GFR
Anion gap: 11 (ref 5–15)
BUN: 10 mg/dL (ref 6–20)
CO2: 27 mmol/L (ref 22–32)
Calcium: 8.5 mg/dL — ABNORMAL LOW (ref 8.9–10.3)
Chloride: 101 mmol/L (ref 98–111)
Creatinine, Ser: 0.87 mg/dL (ref 0.44–1.00)
GFR, Estimated: >60 mL/min (ref >60)
Glucose, Bld: 107 mg/dL — ABNORMAL HIGH (ref 70–99)
Potassium: 3.1 mmol/L — ABNORMAL LOW (ref 3.5–5.1)
Sodium: 139 mmol/L (ref 135–145)

## 2024-11-07 LAB — CBC
HCT: 48.6 % — ABNORMAL HIGH (ref 36.0–46.0)
Hemoglobin: 16.1 g/dL — ABNORMAL HIGH (ref 12.0–15.0)
MCH: 30.6 pg (ref 26.0–34.0)
MCHC: 33.1 g/dL (ref 30.0–36.0)
MCV: 92.2 fL (ref 80.0–100.0)
Platelets: 261 K/uL (ref 150–400)
RBC: 5.27 MIL/uL — ABNORMAL HIGH (ref 3.87–5.11)
RDW: 13.6 % (ref 11.5–15.5)
WBC: 10.8 K/uL — ABNORMAL HIGH (ref 4.0–10.5)
nRBC: 0 % (ref 0.0–0.2)

## 2024-11-07 LAB — SURGICAL PCR SCREEN
MRSA, PCR: NEGATIVE
Staphylococcus aureus: NEGATIVE

## 2024-11-07 LAB — GLUCOSE, CAPILLARY: Glucose-Capillary: 108 mg/dL — ABNORMAL HIGH (ref 70–99)

## 2024-11-07 NOTE — Progress Notes (Signed)
 PCP - Levorn Messing  Cardiologist -denies   PPM/ICD - denies Device Orders - n/a Rep Notified - n/a  Chest x-ray - denies EKG - 07/31/ 2025 Stress Test - 09/24/2016 ECHO - denies Cardiac Cath - 10/25/2018  Sleep Study - OSA  CPAP - denies  Fasting Blood Sugar - 108 at PAT Checks Blood Sugar 0 times a day  Last dose of GLP1 agonist-  n/a GLP1 instructions: n/a  Blood Thinner Instructions: n/a Aspirin  Instructions: n/a  ERAS Protcol -yes  PRE-SURGERY Ensure or G2- G2  COVID TEST- n/a   Anesthesia review: yes, Hx of HTN, DM2, cardiac cath  Patient denies shortness of breath, fever, cough and chest pain at PAT appointment   All instructions explained to the patient, with a verbal understanding of the material. Patient agrees to go over the instructions while at home for a better understanding. Patient also instructed to self quarantine after being tested for COVID-19. The opportunity to ask questions was provided.

## 2024-11-07 NOTE — Pre-Procedure Instructions (Signed)
 Surgical Instructions     Your procedure is scheduled on November 10, 2024. Report to [facility] Main Entrance A at 5:30 A.M., then check in with the Admitting office. Any questions or running late day of surgery: call [phone]   Questions prior to your surgery date: call [phone], Monday-Friday, 8am-4pm. If you experience any cold or flu symptoms such as cough, fever, chills, shortness of breath, etc. between now and your scheduled surgery, please notify us  at the above number.            Remember:       Do not eat after midnight the night before your surgery   You may drink clear liquids until 4:30AM the morning of your surgery.   Clear liquids allowed are: Water, Non-Citrus Juices (without pulp), Carbonated Beverages, Clear Tea (no milk, honey, etc.), Black Coffee Only (NO MILK, CREAM OR POWDERED CREAMER of any kind), and Gatorade.  Patient Instructions  The day of surgery (if you have diabetes): Drink ONE (1) 12 oz G2 given to you in your pre admission testing appointment by 0430 the morning of surgery. Drink in one sitting. Do not sip.  This drink was given to you during your hospital  pre-op appointment visit.  Nothing else to drink after completing the  12 oz bottle of G2.         If you have questions, please contact your surgeon's office.           Take these medicines the morning of surgery with A SIP OF WATER: esomeprazole  (NEXIUM )  pregabalin (LYRICA)  propranolol  (INDERAL )    May take these medicines IF NEEDED: loratadine  (CLARITIN )  nitroGLYCERIN  (NITROSTAT ) 0.4 MG SL tablet  ondansetron  (ZOFRAN -ODT)  oxyCODONE -acetaminophen  (PERCOCET)  SYMBICORT 160-4.5 MCG/ACT inhaler - please bring inhaler to the hospital with you   One week prior to surgery, STOP taking any Aspirin  (unless otherwise instructed by your surgeon) Aleve , Naproxen , Ibuprofen , Motrin , Advil , Goody's, BC's, all herbal medications, fish oil, and non-prescription vitamins.  WHAT DO I DO  ABOUT MY DIABETES MEDICATION?  Please hold empagliflozin (JARDIANCE) for 72 hours prior to surgery. Last dose 11/06/2024  Do not take oral diabetes medicines (pills) the morning of surgery.   HOW TO MANAGE YOUR DIABETES BEFORE AND AFTER SURGERY  Why is it important to control my blood sugar before and after surgery? Improving blood sugar levels before and after surgery helps healing and can limit problems. A way of improving blood sugar control is eating a healthy diet by:  Eating less sugar and carbohydrates  Increasing activity/exercise  Talking with your doctor about reaching your blood sugar goals High blood sugars (greater than 180 mg/dL) can raise your risk of infections and slow your recovery, so you will need to focus on controlling your diabetes during the weeks before surgery. Make sure that the doctor who takes care of your diabetes knows about your planned surgery including the date and location.  How do I manage my blood sugar before surgery? Check your blood sugar at least 4 times a day, starting 2 days before surgery, to make sure that the level is not too high or low.  Check your blood sugar the morning of your surgery when you wake up and every 2 hours until you get to the Short Stay unit.  If your blood sugar is less than 70 mg/dL, you will need to treat for low blood sugar: Do not take insulin. Treat a low blood sugar (less than 70 mg/dL) with  cup of clear juice (cranberry or apple), 4 glucose tablets, OR glucose gel. Recheck blood sugar in 15 minutes after treatment (to make sure it is greater than 70 mg/dL). If your blood sugar is not greater than 70 mg/dL on recheck, call [phone] for further instructions. Report your blood sugar to the short stay nurse when you get to Short Stay.  If you are admitted to the hospital after surgery: Your blood sugar will be checked by the staff and you will probably be given insulin after surgery (instead of oral diabetes  medicines) to make sure you have good blood sugar levels. The goal for blood sugar control after surgery is 80-180 mg/dL.                      Do NOT Smoke (Tobacco/Vaping) for 24 hours prior to your procedure.   If you use a CPAP at night, you may bring your mask/headgear for your overnight stay.   You will be asked to remove any contacts, glasses, piercing's, hearing aid's, dentures/partials prior to surgery. Please bring cases for these items if needed.    Patients discharged the day of surgery will not be allowed to drive home, and someone needs to stay with them for 24 hours.   SURGICAL WAITING ROOM VISITATION Patients may have no more than 2 support people in the waiting area - these visitors may rotate.   Pre-op nurse will coordinate an appropriate time for 1 ADULT support person, who may not rotate, to accompany patient in pre-op.  Children under the age of 25 must have an adult with them who is not the patient and must remain in the main waiting area with an adult.   If the patient needs to stay at the hospital during part of their recovery, the visitor guidelines for inpatient rooms apply.   Please refer to the [facility] website for the visitor guidelines for any additional information.     If you received a COVID test during your pre-op visit  it is requested that you wear a mask when out in public, stay away from anyone that may not be feeling well and notify your surgeon if you develop symptoms. If you have been in contact with anyone that has tested positive in the last 10 days please notify you surgeon.         Pre-operative 4 CHG Bathing Instructions    You can play a key role in reducing the risk of infection after surgery. Your skin needs to be as free of germs as possible. You can reduce the number of germs on your skin by washing with CHG (chlorhexidine  gluconate) soap before surgery. CHG is an antiseptic soap that kills germs and continues to kill germs even after  washing.    DO NOT use if you have an allergy to chlorhexidine /CHG or antibacterial soaps. If your skin becomes reddened or irritated, stop using the CHG and notify one of our RNs at [phone].    Please shower with the CHG soap starting 4 days before surgery using the following schedule:       Please keep in mind the following:  DO NOT shave, including legs and underarms, starting the day of your first shower.   You may shave your face at any point before/day of surgery.  Place clean sheets on your bed the day you start using CHG soap. Use a clean washcloth (not used since being washed) for each shower. DO NOT sleep with  pets once you start using the CHG.    CHG Shower Instructions:  Wash your face and private area with normal soap. If you choose to wash your hair, wash first with your normal shampoo.  After you use shampoo/soap, rinse your hair and body thoroughly to remove shampoo/soap residue.  Turn the water OFF and apply  bottle of CHG soap to a CLEAN washcloth.  Apply CHG soap ONLY FROM YOUR NECK DOWN TO YOUR TOES (washing for 3-5 minutes)  DO NOT use CHG soap on face, private areas, open wounds, or sores.  Pay special attention to the area where your surgery is being performed.  If you are having back surgery, having someone wash your back for you may be helpful. Wait 2 minutes after CHG soap is applied, then you may rinse off the CHG soap.  Pat dry with a clean towel  Put on clean clothes/pajamas   If you choose to wear lotion, please use ONLY the CHG-compatible lotions that are listed below.   Additional instructions for the day of surgery:   If you choose, you may shower the morning of surgery with an antibacterial soap.  DO NOT APPLY any lotions, deodorants, cologne, or perfumes.   Do not bring valuables to the hospital. [facility] is not responsible for any belongings/valuables. Do not wear nail polish, gel polish, artificial nails, or any other type of covering on  natural nails (fingers and toes) Do not wear jewelry or makeup Put on clean/comfortable clothes.  Please brush your teeth.  Ask your nurse before applying any prescription medications to the skin.        CHG Compatible Lotions    Aveeno Moisturizing lotion  Cetaphil Moisturizing Cream  Cetaphil Moisturizing Lotion  Clairol Herbal Essence Moisturizing Lotion, Dry Skin  Clairol Herbal Essence Moisturizing Lotion, Extra Dry Skin  Clairol Herbal Essence Moisturizing Lotion, Normal Skin  Curel Age Defying Therapeutic Moisturizing Lotion with Alpha Hydroxy  Curel Extreme Care Body Lotion  Curel Soothing Hands Moisturizing Hand Lotion  Curel Therapeutic Moisturizing Cream, Fragrance-Free  Curel Therapeutic Moisturizing Lotion, Fragrance-Free  Curel Therapeutic Moisturizing Lotion, Original Formula  Eucerin Daily Replenishing Lotion  Eucerin Dry Skin Therapy Plus Alpha Hydroxy Crme  Eucerin Dry Skin Therapy Plus Alpha Hydroxy Lotion  Eucerin Original Crme  Eucerin Original Lotion  Eucerin Plus Crme Eucerin Plus Lotion  Eucerin TriLipid Replenishing Lotion  Keri Anti-Bacterial Hand Lotion  Keri Deep Conditioning Original Lotion Dry Skin Formula Softly Scented  Keri Deep Conditioning Original Lotion, Fragrance Free Sensitive Skin Formula  Keri Lotion Fast Absorbing Fragrance Free Sensitive Skin Formula  Keri Lotion Fast Absorbing Softly Scented Dry Skin Formula  Keri Original Lotion  Keri Skin Renewal Lotion Keri Silky Smooth Lotion  Keri Silky Smooth Sensitive Skin Lotion  Nivea Body Creamy Conditioning Oil  Nivea Body Extra Enriched Lotion  Nivea Body Original Lotion  Nivea Body Sheer Moisturizing Lotion Nivea Crme  Nivea Skin Firming Lotion  NutraDerm 30 Skin Lotion  NutraDerm Skin Lotion  NutraDerm Therapeutic Skin Cream  NutraDerm Therapeutic Skin Lotion  ProShield Protective Hand Cream  Provon moisturizing lotion   Please read over the following fact sheets that you  were given.

## 2024-11-08 LAB — HEMOGLOBIN A1C
Hgb A1c MFr Bld: 6.3 % — ABNORMAL HIGH (ref 4.8–5.6)
Mean Plasma Glucose: 134 mg/dL

## 2024-11-10 ENCOUNTER — Encounter (HOSPITAL_COMMUNITY): Admission: RE | Disposition: A | Payer: Self-pay | Source: Home / Self Care | Attending: Orthopedic Surgery

## 2024-11-10 ENCOUNTER — Ambulatory Visit (HOSPITAL_COMMUNITY): Admitting: Anesthesiology

## 2024-11-10 ENCOUNTER — Ambulatory Visit (HOSPITAL_COMMUNITY)
Admission: RE | Admit: 2024-11-10 | Discharge: 2024-11-10 | Disposition: A | Attending: Orthopedic Surgery | Admitting: Orthopedic Surgery

## 2024-11-10 ENCOUNTER — Other Ambulatory Visit (HOSPITAL_COMMUNITY): Payer: Self-pay

## 2024-11-10 ENCOUNTER — Ambulatory Visit (HOSPITAL_COMMUNITY): Admitting: Physician Assistant

## 2024-11-10 ENCOUNTER — Encounter (HOSPITAL_COMMUNITY): Payer: Self-pay | Admitting: Orthopedic Surgery

## 2024-11-10 ENCOUNTER — Other Ambulatory Visit: Payer: Self-pay | Admitting: Orthopedic Surgery

## 2024-11-10 ENCOUNTER — Encounter: Payer: Self-pay | Admitting: Internal Medicine

## 2024-11-10 ENCOUNTER — Ambulatory Visit (HOSPITAL_COMMUNITY)

## 2024-11-10 DIAGNOSIS — M2550 Pain in unspecified joint: Secondary | ICD-10-CM

## 2024-11-10 DIAGNOSIS — M65312 Trigger thumb, left thumb: Secondary | ICD-10-CM

## 2024-11-10 DIAGNOSIS — G8929 Other chronic pain: Secondary | ICD-10-CM

## 2024-11-10 DIAGNOSIS — M5416 Radiculopathy, lumbar region: Secondary | ICD-10-CM

## 2024-11-10 DIAGNOSIS — G479 Sleep disorder, unspecified: Secondary | ICD-10-CM

## 2024-11-10 DIAGNOSIS — I10 Essential (primary) hypertension: Secondary | ICD-10-CM

## 2024-11-10 DIAGNOSIS — R7689 Other specified abnormal immunological findings in serum: Secondary | ICD-10-CM

## 2024-11-10 DIAGNOSIS — E119 Type 2 diabetes mellitus without complications: Secondary | ICD-10-CM

## 2024-11-10 DIAGNOSIS — M7541 Impingement syndrome of right shoulder: Secondary | ICD-10-CM

## 2024-11-10 DIAGNOSIS — R7303 Prediabetes: Secondary | ICD-10-CM

## 2024-11-10 DIAGNOSIS — E538 Deficiency of other specified B group vitamins: Secondary | ICD-10-CM

## 2024-11-10 DIAGNOSIS — Z6841 Body Mass Index (BMI) 40.0 and over, adult: Secondary | ICD-10-CM

## 2024-11-10 DIAGNOSIS — R269 Unspecified abnormalities of gait and mobility: Secondary | ICD-10-CM

## 2024-11-10 DIAGNOSIS — F1290 Cannabis use, unspecified, uncomplicated: Secondary | ICD-10-CM

## 2024-11-10 DIAGNOSIS — F32A Depression, unspecified: Secondary | ICD-10-CM

## 2024-11-10 DIAGNOSIS — M65321 Trigger finger, right index finger: Secondary | ICD-10-CM

## 2024-11-10 DIAGNOSIS — M791 Myalgia, unspecified site: Secondary | ICD-10-CM

## 2024-11-10 DIAGNOSIS — M12262 Villonodular synovitis (pigmented), left knee: Secondary | ICD-10-CM

## 2024-11-10 DIAGNOSIS — E669 Obesity, unspecified: Secondary | ICD-10-CM

## 2024-11-10 DIAGNOSIS — Z01818 Encounter for other preprocedural examination: Secondary | ICD-10-CM

## 2024-11-10 DIAGNOSIS — E611 Iron deficiency: Secondary | ICD-10-CM

## 2024-11-10 DIAGNOSIS — Z9889 Other specified postprocedural states: Secondary | ICD-10-CM

## 2024-11-10 DIAGNOSIS — J329 Chronic sinusitis, unspecified: Secondary | ICD-10-CM

## 2024-11-10 DIAGNOSIS — J40 Bronchitis, not specified as acute or chronic: Secondary | ICD-10-CM

## 2024-11-10 DIAGNOSIS — Z96651 Presence of right artificial knee joint: Secondary | ICD-10-CM

## 2024-11-10 DIAGNOSIS — Z789 Other specified health status: Secondary | ICD-10-CM

## 2024-11-10 DIAGNOSIS — M7062 Trochanteric bursitis, left hip: Secondary | ICD-10-CM

## 2024-11-10 DIAGNOSIS — G4733 Obstructive sleep apnea (adult) (pediatric): Secondary | ICD-10-CM

## 2024-11-10 DIAGNOSIS — Z720 Tobacco use: Secondary | ICD-10-CM

## 2024-11-10 DIAGNOSIS — Z96642 Presence of left artificial hip joint: Secondary | ICD-10-CM

## 2024-11-10 DIAGNOSIS — L0292 Furuncle, unspecified: Secondary | ICD-10-CM

## 2024-11-10 DIAGNOSIS — M65959 Unspecified synovitis and tenosynovitis, unspecified thigh: Secondary | ICD-10-CM

## 2024-11-10 HISTORY — PX: LUMBAR LAMINECTOMY/DECOMPRESSION MICRODISCECTOMY: SHX5026

## 2024-11-10 LAB — GLUCOSE, CAPILLARY
Glucose-Capillary: 126 mg/dL — ABNORMAL HIGH (ref 70–99)
Glucose-Capillary: 146 mg/dL — ABNORMAL HIGH (ref 70–99)

## 2024-11-10 SURGERY — LUMBAR LAMINECTOMY/DECOMPRESSION MICRODISCECTOMY 1 LEVEL
Anesthesia: General | Site: Spine Lumbar

## 2024-11-10 MED ORDER — PROPOFOL 10 MG/ML IV BOLUS
INTRAVENOUS | Status: AC
  Filled 2024-11-10: qty 20

## 2024-11-10 MED ORDER — HYDROMORPHONE HCL 1 MG/ML IJ SOLN
INTRAMUSCULAR | Status: DC | PRN
  Administered 2024-11-10: .5 mg via INTRAVENOUS

## 2024-11-10 MED ORDER — VANCOMYCIN HCL 1000 MG IV SOLR
INTRAVENOUS | Status: DC | PRN
  Administered 2024-11-10: 1000 mg

## 2024-11-10 MED ORDER — PROPOFOL 10 MG/ML IV BOLUS
INTRAVENOUS | Status: DC | PRN
  Administered 2024-11-10: 170 mg via INTRAVENOUS

## 2024-11-10 MED ORDER — FENTANYL CITRATE (PF) 100 MCG/2ML IJ SOLN
INTRAMUSCULAR | Status: AC
  Filled 2024-11-10: qty 2

## 2024-11-10 MED ORDER — DEXAMETHASONE SOD PHOSPHATE PF 10 MG/ML IJ SOLN: 10.0000 mg | Freq: Once | INTRAMUSCULAR | Status: DC

## 2024-11-10 MED ORDER — OXYCODONE HCL 5 MG PO TABS: 5.0000 mg | ORAL_TABLET | ORAL | 0 refills | Status: AC | PRN

## 2024-11-10 MED ORDER — SENNA 8.6 MG PO TABS
1.0000 | ORAL_TABLET | Freq: Two times a day (BID) | ORAL | 0 refills | Status: AC
  Filled 2024-11-10: qty 28, 14d supply, fill #0

## 2024-11-10 MED ORDER — BUPIVACAINE-EPINEPHRINE (PF) 0.25% -1:200000 IJ SOLN
INTRAMUSCULAR | Status: DC | PRN
  Administered 2024-11-10: 30 mL

## 2024-11-10 MED ORDER — CHLORHEXIDINE GLUCONATE 0.12 % MT SOLN
OROMUCOSAL | Status: AC
  Administered 2024-11-10: 15 mL via OROMUCOSAL
  Filled 2024-11-10: qty 15

## 2024-11-10 MED ORDER — KETAMINE HCL 50 MG/5ML IJ SOSY
PREFILLED_SYRINGE | INTRAMUSCULAR | Status: AC
  Filled 2024-11-10: qty 5

## 2024-11-10 MED ORDER — ONDANSETRON HCL 4 MG/2ML IJ SOLN
INTRAMUSCULAR | Status: AC
  Filled 2024-11-10: qty 2

## 2024-11-10 MED ORDER — LIDOCAINE 2% (20 MG/ML) 5 ML SYRINGE
INTRAMUSCULAR | Status: AC
  Filled 2024-11-10: qty 5

## 2024-11-10 MED ORDER — OXYCODONE HCL 5 MG PO TABS
5.0000 mg | ORAL_TABLET | Freq: Once | ORAL | Status: AC | PRN
  Administered 2024-11-10: 5 mg via ORAL

## 2024-11-10 MED ORDER — ORAL CARE MOUTH RINSE: 15.0000 mL | Freq: Once | OROMUCOSAL | Status: AC

## 2024-11-10 MED ORDER — DROPERIDOL 2.5 MG/ML IJ SOLN: 0.6250 mg | Freq: Once | INTRAMUSCULAR | Status: DC | PRN

## 2024-11-10 MED ORDER — CHLORHEXIDINE GLUCONATE 0.12 % MT SOLN: 15.0000 mL | Freq: Once | OROMUCOSAL | Status: AC

## 2024-11-10 MED ORDER — CEFAZOLIN SODIUM-DEXTROSE 3-4 GM/150ML-% IV SOLN
3.0000 g | INTRAVENOUS | Status: AC
  Administered 2024-11-10 (×2): 3 g via INTRAVENOUS
  Filled 2024-11-10: qty 150

## 2024-11-10 MED ORDER — KETAMINE HCL 50 MG/5ML IJ SOSY
PREFILLED_SYRINGE | INTRAMUSCULAR | Status: DC | PRN
  Administered 2024-11-10: 20 mg via INTRAVENOUS
  Administered 2024-11-10 (×2): 10 mg via INTRAVENOUS

## 2024-11-10 MED ORDER — PHENYLEPHRINE 80 MCG/ML (10ML) SYRINGE FOR IV PUSH (FOR BLOOD PRESSURE SUPPORT)
PREFILLED_SYRINGE | INTRAVENOUS | Status: AC
  Filled 2024-11-10: qty 10

## 2024-11-10 MED ORDER — POVIDONE-IODINE 10 % EX SWAB
2.0000 | Freq: Once | CUTANEOUS | Status: AC
  Administered 2024-11-10: 2 via TOPICAL

## 2024-11-10 MED ORDER — FENTANYL CITRATE (PF) 250 MCG/5ML IJ SOLN
INTRAMUSCULAR | Status: DC | PRN
  Administered 2024-11-10: 50 ug via INTRAVENOUS
  Administered 2024-11-10: 100 ug via INTRAVENOUS
  Administered 2024-11-10 (×4): 50 ug via INTRAVENOUS

## 2024-11-10 MED ORDER — LACTATED RINGERS IV SOLN: INTRAVENOUS | Status: DC

## 2024-11-10 MED ORDER — LIDOCAINE 2% (20 MG/ML) 5 ML SYRINGE
INTRAMUSCULAR | Status: DC | PRN
  Administered 2024-11-10: 100 mg via INTRAVENOUS

## 2024-11-10 MED ORDER — ROCURONIUM BROMIDE 10 MG/ML (PF) SYRINGE
PREFILLED_SYRINGE | INTRAVENOUS | Status: AC
  Filled 2024-11-10: qty 10

## 2024-11-10 MED ORDER — CHLORHEXIDINE GLUCONATE 0.12 % MT SOLN: 15.0000 mL | Freq: Once | OROMUCOSAL | Status: DC

## 2024-11-10 MED ORDER — POLYETHYLENE GLYCOL 3350 17 GM/SCOOP PO POWD
17.0000 g | Freq: Every day | ORAL | 0 refills | Status: AC
  Filled 2024-11-10: qty 238, 14d supply, fill #0

## 2024-11-10 MED ORDER — FENTANYL CITRATE (PF) 100 MCG/2ML IJ SOLN
25.0000 ug | INTRAMUSCULAR | Status: DC | PRN
  Administered 2024-11-10: 25 ug via INTRAVENOUS
  Administered 2024-11-10: 50 ug via INTRAVENOUS
  Administered 2024-11-10: 25 ug via INTRAVENOUS

## 2024-11-10 MED ORDER — ROCURONIUM BROMIDE 10 MG/ML (PF) SYRINGE
PREFILLED_SYRINGE | INTRAVENOUS | Status: DC | PRN
  Administered 2024-11-10: 60 mg via INTRAVENOUS
  Administered 2024-11-10: 20 mg via INTRAVENOUS
  Administered 2024-11-10: 10 mg via INTRAVENOUS
  Administered 2024-11-10: 20 mg via INTRAVENOUS
  Administered 2024-11-10: 10 mg via INTRAVENOUS

## 2024-11-10 MED ORDER — ORAL CARE MOUTH RINSE: 15.0000 mL | Freq: Once | OROMUCOSAL | Status: DC

## 2024-11-10 MED ORDER — TRANEXAMIC ACID-NACL 1000-0.7 MG/100ML-% IV SOLN
1000.0000 mg | INTRAVENOUS | Status: AC
  Administered 2024-11-10: 1000 mg via INTRAVENOUS

## 2024-11-10 MED ORDER — SUGAMMADEX SODIUM 200 MG/2ML IV SOLN
INTRAVENOUS | Status: DC | PRN
  Administered 2024-11-10: 400 mg via INTRAVENOUS

## 2024-11-10 MED ORDER — FENTANYL CITRATE (PF) 250 MCG/5ML IJ SOLN
INTRAMUSCULAR | Status: AC
  Filled 2024-11-10: qty 5

## 2024-11-10 MED ORDER — HYDROMORPHONE HCL 1 MG/ML IJ SOLN
INTRAMUSCULAR | Status: AC
  Filled 2024-11-10: qty 0.5

## 2024-11-10 MED ORDER — TRANEXAMIC ACID-NACL 1000-0.7 MG/100ML-% IV SOLN
INTRAVENOUS | Status: AC
  Filled 2024-11-10: qty 100

## 2024-11-10 MED ORDER — DEXAMETHASONE SOD PHOSPHATE PF 10 MG/ML IJ SOLN
INTRAMUSCULAR | Status: DC | PRN
  Administered 2024-11-10: 10 mg via INTRAVENOUS

## 2024-11-10 MED ORDER — OXYCODONE HCL 5 MG PO TABS
5.0000 mg | ORAL_TABLET | ORAL | 0 refills | Status: DC | PRN
  Filled 2024-11-10: qty 30, 7d supply, fill #0

## 2024-11-10 MED ORDER — 0.9 % SODIUM CHLORIDE (POUR BTL) OPTIME
TOPICAL | Status: DC | PRN
  Administered 2024-11-10: 1000 mL

## 2024-11-10 MED ORDER — ACETAMINOPHEN 500 MG PO TABS: 1000.0000 mg | ORAL_TABLET | Freq: Once | ORAL | Status: DC

## 2024-11-10 MED ORDER — BUPIVACAINE-EPINEPHRINE (PF) 0.25% -1:200000 IJ SOLN
INTRAMUSCULAR | Status: AC
  Filled 2024-11-10: qty 30

## 2024-11-10 MED ORDER — ONDANSETRON HCL 4 MG/2ML IJ SOLN
INTRAMUSCULAR | Status: DC | PRN
  Administered 2024-11-10: 4 mg via INTRAVENOUS

## 2024-11-10 MED ORDER — OXYCODONE HCL 5 MG PO TABS
ORAL_TABLET | ORAL | Status: AC
  Filled 2024-11-10: qty 1

## 2024-11-10 MED ORDER — MIDAZOLAM HCL (PF) 2 MG/2ML IJ SOLN
INTRAMUSCULAR | Status: DC | PRN
  Administered 2024-11-10: 2 mg via INTRAVENOUS

## 2024-11-10 MED ORDER — MIDAZOLAM HCL 2 MG/2ML IJ SOLN
INTRAMUSCULAR | Status: AC
  Filled 2024-11-10: qty 2

## 2024-11-10 MED ORDER — OXYCODONE HCL 5 MG/5ML PO SOLN: 5.0000 mg | Freq: Once | ORAL | Status: AC | PRN

## 2024-11-10 MED ORDER — CEFAZOLIN SODIUM 1 G IJ SOLR
INTRAMUSCULAR | Status: AC
  Filled 2024-11-10: qty 30

## 2024-11-10 MED ORDER — METHOCARBAMOL 500 MG PO TABS
500.0000 mg | ORAL_TABLET | Freq: Four times a day (QID) | ORAL | 0 refills | Status: AC
  Filled 2024-11-10: qty 40, 10d supply, fill #0

## 2024-11-10 SURGICAL SUPPLY — 46 items
BLADE CLIPPER SURG (BLADE) IMPLANT
BUR NEURO DRILL SOFT 3.0X3.8M (BURR) ×1 IMPLANT
CANISTER SUCTION 3000ML PPV (SUCTIONS) ×1 IMPLANT
CORD BIPOLAR FORCEPS 12FT (ELECTRODE) IMPLANT
COVER SURGICAL LIGHT HANDLE (MISCELLANEOUS) ×1 IMPLANT
DERMABOND ADVANCED .7 DNX12 (GAUZE/BANDAGES/DRESSINGS) ×2 IMPLANT
DRAPE C-ARM 42X72 X-RAY (DRAPES) ×1 IMPLANT
DRAPE MICROSCOPE LEICA 54X105 (DRAPES) IMPLANT
DRAPE SURG 17X23 STRL (DRAPES) ×4 IMPLANT
DRAPE UTILITY XL STRL (DRAPES) ×2 IMPLANT
DRESSING MEPILEX FLEX 4X4 (GAUZE/BANDAGES/DRESSINGS) ×1 IMPLANT
DRSG MEPILEX POST OP 4X8 (GAUZE/BANDAGES/DRESSINGS) IMPLANT
DRSG TEGADERM 4X10 (GAUZE/BANDAGES/DRESSINGS) ×2 IMPLANT
DRSG TEGADERM 4X4.75 (GAUZE/BANDAGES/DRESSINGS) IMPLANT
DURAPREP 26ML APPLICATOR (WOUND CARE) ×1 IMPLANT
ELECT COATED BLADE 2.86 ST (ELECTRODE) ×1 IMPLANT
ELECT PENCIL ROCKER SW 15FT (MISCELLANEOUS) ×1 IMPLANT
ELECTRODE BLADE INSULATED 4IN (ELECTROSURGICAL) IMPLANT
ELECTRODE REM PT RTRN 9FT ADLT (ELECTROSURGICAL) ×1 IMPLANT
GAUZE 4X4 16PLY ~~LOC~~+RFID DBL (SPONGE) ×1 IMPLANT
GAUZE SPONGE 4X4 12PLY STRL (GAUZE/BANDAGES/DRESSINGS) ×1 IMPLANT
GLOVE BIOGEL PI IND STRL 8.5 (GLOVE) IMPLANT
GLOVE ECLIPSE 8.0 STRL XLNG CF (GLOVE) IMPLANT
GLOVE INDICATOR 7.5 STRL GRN (GLOVE) ×1 IMPLANT
GLOVE SS BIOGEL STRL SZ 7.5 (GLOVE) ×1 IMPLANT
GOWN STRL REUS W/TWL LRG LVL3 (GOWN DISPOSABLE) ×1 IMPLANT
GOWN STRL SURGICAL XL XLNG (GOWN DISPOSABLE) ×1 IMPLANT
KIT BASIN OR (CUSTOM PROCEDURE TRAY) ×1 IMPLANT
KIT POSITIONER JACKSON TABLE (MISCELLANEOUS) ×1 IMPLANT
KIT TURNOVER KIT B (KITS) ×1 IMPLANT
MARKER SKIN DUAL TIP RULER LAB (MISCELLANEOUS) ×1 IMPLANT
NDL 22X1.5 STRL (OR ONLY) (MISCELLANEOUS) ×1 IMPLANT
PACK LAMINECTOMY ORTHO (CUSTOM PROCEDURE TRAY) ×1 IMPLANT
SOL PREP POV-IOD 4OZ 10% (MISCELLANEOUS) ×1 IMPLANT
SOLN 0.9% NACL POUR BTL 1000ML (IV SOLUTION) ×1 IMPLANT
SOLN STERILE WATER BTL 1000 ML (IV SOLUTION) ×1 IMPLANT
SUCTION TUBE FRAZIER 10FR DISP (SUCTIONS) ×1 IMPLANT
SUT BONE WAX W31G (SUTURE) ×1 IMPLANT
SUT MNCRL AB 3-0 PS2 18 (SUTURE) IMPLANT
SUT VIC AB 0 CT1 18XCR BRD8 (SUTURE) ×1 IMPLANT
SUT VIC AB 2-0 CT1 18 (SUTURE) ×1 IMPLANT
SUT VIC AB 2-0 CT2 18 VCP726D (SUTURE) ×1 IMPLANT
SUT VIC AB 3-0 PS2 18XBRD (SUTURE) ×1 IMPLANT
TOWEL GREEN STERILE (TOWEL DISPOSABLE) ×1 IMPLANT
TOWEL GREEN STERILE FF (TOWEL DISPOSABLE) ×1 IMPLANT
TUBING FEATHERFLOW (TUBING) ×1 IMPLANT

## 2024-11-10 NOTE — Anesthesia Procedure Notes (Addendum)
 Procedure Name: Intubation Date/Time: 11/10/2024 7:43 AM  Performed by: Vera Rochele PARAS, CRNAPre-anesthesia Checklist: Patient identified, Emergency Drugs available, Suction available and Patient being monitored Patient Re-evaluated:Patient Re-evaluated prior to induction Oxygen Delivery Method: Circle System Utilized Preoxygenation: Pre-oxygenation with 100% oxygen Induction Type: IV induction Ventilation: Mask ventilation without difficulty and Oral airway inserted - appropriate to patient size Laryngoscope Size: Miller and 3 Grade View: Grade I Tube type: Oral Tube size: 7.0 mm Number of attempts: 1 Airway Equipment and Method: Stylet and Oral airway Placement Confirmation: ETT inserted through vocal cords under direct vision, positive ETCO2 and breath sounds checked- equal and bilateral Secured at: 22 cm Tube secured with: Tape Dental Injury: Teeth and Oropharynx as per pre-operative assessment

## 2024-11-10 NOTE — Transfer of Care (Signed)
 Immediate Anesthesia Transfer of Care Note  Patient: Sarah Randall  Procedure(s) Performed: LUMBAR LAMINECTOMY/DECOMPRESSION MICRODISCECTOMY 1 LEVEL (Spine Lumbar)  Patient Location: PACU  Anesthesia Type:General  Level of Consciousness: drowsy  Airway & Oxygen Therapy: Patient Spontanous Breathing and Patient connected to face mask oxygen  Post-op Assessment: Report given to RN and Post -op Vital signs reviewed and stable  Post vital signs: Reviewed and stable  Last Vitals:  Vitals Value Taken Time  BP 128/80 11/10/24 12:48  Temp    Pulse 85 11/10/24 12:55  Resp 14 11/10/24 12:55  SpO2 99 % 11/10/24 12:55  Vitals shown include unfiled device data.  Last Pain:  Vitals:   11/10/24 1248  TempSrc:   PainSc: 0-No pain         Complications: No notable events documented.

## 2024-11-10 NOTE — Discharge Instructions (Signed)
 Orthopedic Surgery Discharge Instructions  Patient name: Sarah Randall Procedure Performed: L5/S1 microdiscectomy Date of Surgery: 11/10/2024 Surgeon: Ozell Ada, MD  Pre-operative Diagnosis: lumbar radiculopathy, L5/S1 disc herniation Post-operative Diagnosis: same as above  Discharged to: home Discharge Condition: stable  Activity: You should refrain from bending, lifting, or twisting with objects greater than ten pounds until six weeks after surgery. You are encouraged to walk as much as desired. You can perform household activities such as cleaning dishes, doing laundry, vacuuming, etc. as long as the ten-pound restriction is followed. You do not need to wear a brace during the post-operative period.   Incision Care: Your incision has a dressing over it. That dressing should remain in place and dry at all times for a total of one week after surgery. After one week, you can remove the dressing. Underneath the dressing, you will find skin glue. You should leave the skin glue in place. It will fall off with time. Do not pick, rub, or scrub at it. Do not put cream or lotion over the surgical area. After one week and once the dressing is off, it is okay to let soap and water run over your incision. Again, do not pick, scrub, or rub at the skin glue when bathing. Do not submerge (e.g., take a bath, swim, go in a hot tub, etc.) until six weeks after surgery. There may be some bloody drainage from the incision into the dressing after surgery. This is normal. You do not need to replace the dressing. Continue to leave it in place for the one week as instructed above. Should the dressing become saturated with blood or drainage, please call the office for further instructions.   Medications: You have been prescribed oxycodone . This is a narcotic pain medication and should only be taken as prescribed. You can take this in addition to the percocet that you have been previously prescribed. You should not  drink alcohol or operate heavy machinery (including driving) while taking this medication. The oxycodone  can cause constipation as a side effect. For that reason, you have been prescribed senna and miralax . These are both laxatives. You do not need to take this medication if you develop diarrhea. Should you remain constipated even while taking these medications, please increase the dose of miralax  to twice daily. Robaxin  is a muscle relaxer that has been prescribed to you for muscle spasm type pain. Take this medication as needed.   You can use over-the-counter NSAIDs (ibuprofen , Aleve , Celebrex, naproxen , meloxicam , etc.) for additional pain relief after this surgery starting 72 hours after surgery. These medications are safe to take with the Tylenol  you have been prescribed. You should not take these medications if you have or have had kidney problems or gastrointestinal ulcers. Take these medications as instructed on the packaging.   In order to set expectations for opioid prescriptions, you will only be prescribed opioids for a total of six weeks after surgery and, at two-weeks after surgery, your opioid prescription will start to tapered (decreased dosage and number of pills). If you have ongoing need for opioid medication six weeks after surgery, you will be referred to pain management. If you are already established with a provider that is giving you opioid medications, you should schedule an appointment with them for six weeks after surgery if you feel you are going to need another prescription. State law only allows for opioid prescriptions one week at a time. If you are running out of opioid medication near the end of  the week, please call the office during business hours before running out so I can send you another prescription.   You may resume any home blood thinners (warfarin, lovenox , apixaban, plavix, xarelto, etc) 72 hours after your surgery. Take these medications as they were previously  prescribed.  Driving: You should not drive while taking narcotic pain medications. You should start getting back to driving slowly and you may want to try driving in a parking lot before doing anything more.   Diet: You are safe to resume your regular diet after surgery.   Reasons to Call the Office After Surgery: You should feel free to call the office with any concerns or questions you have in the post-operative period, but you should definitely notify the office if you develop: -shortness of breath, chest pain, or trouble breathing -excessive bleeding, drainage, redness, or swelling around the surgical site -fevers, chills, or pain that is getting worse with each passing day -persistent nausea or vomiting -new weakness in either leg -new or worsening numbness or tingling in either leg -numbness in the groin, bowel or bladder incontinence -other concerns about your surgery  Follow Up Appointments: You should have an office appointment scheduled for approximately two weeks after surgery. If you do not remember when this appointment is or do not already have it scheduled, please call the office to schedule.   Office Information:  -Ozell Ada, MD -Phone number: [phone] -Address: [address]       [place], [place] [address]

## 2024-11-10 NOTE — H&P (Signed)
 Orthopedic Spine Surgery H&P Note  Assessment: Patient is a 58 y.o. female with lumbar radiculopathy   Plan: -Out of bed as tolerated, activity as tolerated, no brace -Covered the risks of surgery one more time with the patient and patient elected to proceed with planned surgery -Written consent verified -Hold anticoagulation in anticipation of surgery -Ancef  and TXA on all to OR -NPO for procedure -Site marked -To OR when ready  The risks covered this morning included but were not limited to: recurrent disc herniation, persistent pain, dural tear, nerve root injury, paralysis, infection, bleeding, fracture, instability, dvt/pe, need for additional procedures, heart attack, and death. I explained that her risks particularly for wound healing complications, recurrent disc herniation, and infection would be higher given her morbid obesity.   ___________________________________________________________________________  Chief Complaint: low back pain that radiates into the lateral right thigh and leg   History: Patient is 58 y.o. female who has been previously seen in the office for low back pain that radiates into the right lateral thigh and leg. Work up was consistent with lumbar radiculopathy. Her symptoms failed to improve with conservative treatment so operative management was discussed at the last office visit. The patient presents today with no changes in her symptoms since the last office visit. See previous office note for further details.    Review of systems: General: denies fevers and chills, myalgias Neurologic: denies recent changes in vision, slurred speech Abdomen: denies nausea, vomiting, hematemesis Respiratory: denies cough, shortness of breath  Past medical history: HLD HTN GERD DM OSA Chronic pain Depression   Allergies: pregabalin, cymbalta , escitalopram , gabapentin , lipitor, pravachol , repatha , zoloft    Past surgical history:  L4/5 OLIF and PSIF Revision  L4/5 instrumentation Hysterectomy Left knee medial meniscectomy Left knee synovectomy Right TKA Left THA   Social history: Quit use of nicotine  product (smoking, vaping, patches, smokeless) - has a negative nicotine  test on 10/23/2024 Alcohol use: denies Denies recreational drug use  Family history: -reviewed and not pertinent to lumbar radiculopathy   Physical Exam:  General: no acute distress, appears stated age Neurologic: alert, answering questions appropriately, following commands Cardiovascular: regular rate, no cyanosis Respiratory: unlabored breathing on room air, symmetric chest rise Psychiatric: appropriate affect, normal cadence to speech   MSK (spine):  -Strength exam      Left  Right  EHL    5/5  5/5 TA    5/5  5/5 GSC    5/5  5/5 Knee extension  5/5  5/5 Knee flexion   5/5  5/5 Hip flexion   5/5  5/5  -Sensory exam    Sensation intact to light touch in L3-S1 nerve distributions of bilateral lower extremities   Patient name: Sarah Randall Patient MRN: [id number] Date: 11/10/24

## 2024-11-10 NOTE — Progress Notes (Signed)
 Patient ambulated around the unit with a walker prior to discharge.

## 2024-11-10 NOTE — Anesthesia Preprocedure Evaluation (Signed)
 Anesthesia Evaluation  Patient identified by MRN, date of birth, ID band Patient awake    Reviewed: Allergy & Precautions, NPO status , Patient's Chart, lab work & pertinent test results  Airway Mallampati: III  TM Distance: >3 FB Neck ROM: Full    Dental no notable dental hx.    Pulmonary sleep apnea , Current Smoker and Patient abstained from smoking.   Pulmonary exam normal        Cardiovascular hypertension, Pt. on medications and Pt. on home beta blockers  Rhythm:Regular Rate:Normal     Neuro/Psych   Anxiety Depression       GI/Hepatic Neg liver ROS,GERD  Medicated,,  Endo/Other  diabetes, Type 2    Renal/GU negative Renal ROS  negative genitourinary   Musculoskeletal  (+) Arthritis , Osteoarthritis,    Abdominal Normal abdominal exam  (+)   Peds  Hematology  (+) Blood dyscrasia, anemia Lab Results      Component                Value               Date                      WBC                      10.8 (H)            11/07/2024                HGB                      16.1 (H)            11/07/2024                HCT                      48.6 (H)            11/07/2024                MCV                      92.2                11/07/2024                PLT                      261                 11/07/2024             Lab Results      Component                Value               Date                      NA                       139                 11/07/2024                K  3.1 (L)             11/07/2024                CO2                      27                  11/07/2024                GLUCOSE                  107 (H)             11/07/2024                BUN                      10                  11/07/2024                CREATININE               0.87                11/07/2024                CALCIUM                   8.5 (L)             11/07/2024                EGFR                      75                  01/08/2022                GFRNONAA                 >60                 11/07/2024              Anesthesia Other Findings   Reproductive/Obstetrics                              Anesthesia Physical Anesthesia Plan  ASA: 3  Anesthesia Plan: General   Post-op Pain Management: Tylenol  PO (pre-op)* and Ketamine  IV*   Induction: Intravenous  PONV Risk Score and Plan: 2 and Ondansetron , Dexamethasone , Midazolam  and Treatment may vary due to age or medical condition  Airway Management Planned: Mask and Oral ETT  Additional Equipment: None  Intra-op Plan:   Post-operative Plan: Extubation in OR  Informed Consent: I have reviewed the patients History and Physical, chart, labs and discussed the procedure including the risks, benefits and alternatives for the proposed anesthesia with the patient or authorized representative who has indicated his/her understanding and acceptance.     Dental advisory given  Plan Discussed with: CRNA  Anesthesia Plan Comments:         Anesthesia Quick Evaluation

## 2024-11-10 NOTE — Anesthesia Postprocedure Evaluation (Signed)
 Anesthesia Post Note  Patient: Sarah Randall  Procedure(s) Performed: LUMBAR LAMINECTOMY/DECOMPRESSION MICRODISCECTOMY 1 LEVEL (Spine Lumbar)     Patient location during evaluation: PACU Anesthesia Type: General Level of consciousness: awake and alert Pain management: pain level controlled Vital Signs Assessment: post-procedure vital signs reviewed and stable Respiratory status: spontaneous breathing, nonlabored ventilation, respiratory function stable and patient connected to nasal cannula oxygen Cardiovascular status: blood pressure returned to baseline and stable Postop Assessment: no apparent nausea or vomiting Anesthetic complications: no   No notable events documented.  Last Vitals:  Vitals:   11/10/24 1400 11/10/24 1415  BP: (!) 133/91 121/83  Pulse: 74 75  Resp: 13 15  Temp: (!) 35.9 C (!) 36.1 C  SpO2: 96% 96%    Last Pain:  Vitals:   11/10/24 1406  TempSrc:   PainSc: 4                  Cohen Doleman P Quida Glasser

## 2024-11-10 NOTE — Op Note (Signed)
 Orthopedic Spine Surgery Operative Report  Procedure: L5/S1 right sided hemilaminotomy and microdiscectomy  Modifier: none  Date of procedure: 11/10/2024  Patient name: Sarah Randall MRN: [id number] DOB: Feb 05, 1966  Surgeon: Ozell Ada, MD Assistant: none Pre-operative diagnosis: L5/S1 disc herniation, lumbar radiculopathy Post-operative diagnosis: same as above Findings: right paracentral L5/S1 herniated disc  Specimens: none Anesthesia: general EBL: 30cc Complications: none Pre-incision antibiotic: ancef  TXA was given prior to incision as well  Implants: none   Indication for procedure: Patient is a 58 y.o. female who presented to the office with signs and symptoms consistent with L5/S1 disc herniation. The patient had tried conservative treatments that did not provide any lasting relief. As result, operative management was discussed. The pre-operative MRI showed a L5/S1 disc herniation so L5/S1 microdiscectomy was presented as a treatment option. The risks, benefits, and alternatives of the surgery were covered with the patient. All the patient's questions were answered to her satisfaction. After this discussion, the patient expressed understanding and elected to proceed with surgical intervention.  Procedure Description: The patient was met in the pre-operative holding area. The patient's identity and consent were verified. The operative site was marked. The patient's remaining questions about the surgery were answered. The patient was brought back to the operating room. General anesthesia was induced and an endotracheal tube was placed by the anesthesia staff. The patient was transferred to the prone Monroe table in the prone position. All bony prominences were well padded. The head of the bed was slightly elevated and the eyes were free from compression by the face pillow. The surgical area was cleansed with alcohol. Fluoroscopy was then brought in to check rotation on the AP  image and to mark the levels on the lateral image. The patient's skin was then prepped and draped in a standard, sterile fashion. A time out was performed that identified the patient, the procedure, and the operative level. All team members agreed with what was stated in the time out.   A midline incision over the spinous processes of the previously marked levels was made and sharp dissection was continued down through the skin and dermis. Electrocautery was then used to continue the midline dissection down to the level of the spinous process. Subperiosteal dissection was performed using electrocautery to expose the lamina out lateral to the facet joint capsule on the right side which was the side of the disc herniation. Care was taken to not violate the facet joint capsule. A lateral fluoroscopic image was taken to confirm the level. Subperiosteal dissection with electrocautery was then done to expose all the remainder of the lamina and pars interarticularis of L5 and the cranial aspect of S1. The operative microscope was brought in at this time.   A high-speed matchstick burr was used to thin the hemilamina to the level of the ligamentum flavum. The lamina was thinned with the burr to the edge of the ligamentum flavum insertion. Care was taken to leave at least 8mm of pars interarticularis. A curved curette was used to develop a plane between the ligamentum and the lamina. A series of Kerrison rongeurs were used to remove the thinned lamina to complete the laminotomy. A curved curette was used to elevate the ligamentum flavum off of the thecal sac. A combination of Kerrison rongeurs and a pituitary were used to remove the ligamentum flavum overlying the thecal sac and nerve root in the area of the laminotomy.   A penfield was used to mobilize the nerve root. A nerve root  retractor was placed into the laminotomy site and around the traversing S1 nerve root to mobilize it medially. The disc herniation was  visualized. A long-handle knife was used to create an annulotomy. A pituitary was used to remove the herniated disc fragment. A pituitary was used to remove the loose fragments. An upbiting pituitary was used to remove further fragments. A nerve hook was placed into the annulotomy to attempt to free any other loose fragments. There were no other loose fragments. A woodsen was used to palpate the disc space and there was no remaining herniation. The disc was in line with the posterior vertebral bodies. A lateral fluoroscopic image was taken with an epstein curette in place demonstrating the correct level was done.  The wound was copiously irrigated with sterile saline. 1g of vancomycin  powder was placed into the wound. The fascia was reapproximated with 0 vicryl suture. The subcutaneous fat was reapproximated with 0 vicryl suture. The deep dermal layer was reapproximated with 2-0 vicryl. The skin as closed with a 3-0 running monocryl. All counts were correct at the end of the case. Dermabond was applied over the skin. An island dressing was placed over the wound. The patient was transferred back to a bed and brought to the post-anesthesia care unit by anesthesia staff in stable condition.   Post-operative plan: The patient will recover in the post-anesthesia care unit with a plan to go home after recovering from anesthesia. The patient will be out of bed as tolerated with no brace. The patient will be seen in the office approximately 2 weeks from the date of surgery.    Ozell Ada, MD Orthopedic Surgeon

## 2024-11-10 NOTE — Progress Notes (Signed)
 Orthopedic Surgery Post-operative Progress Note  Assessment: Patient is a 58 y.o. female who is currently recovering in L5/S1 microdiscectomy   Plan: -Operative plans complete -Drain: none -Out of bed as tolerated, no brace -No bending/lifting/twisting greater than 10 pounds -Pain control -Regular diet -No chemoprophylaxis for dvt or antiplatelets for 72 hours after surgery -Anticipate discharge to home from PACU  ___________________________________________________________________________   Subjective: No acute events since surgery. Recovering in PACU. Pain well controlled.   Objective:  General: no acute distress, appropriate affect Neurologic: alert, answering questions appropriately, following commands Respiratory: unlabored breathing on room air Skin: dressing clear/dry/intact  MSK (spine):  -Strength exam      Right  Left  EHL    5/5  5/5 TA    5/5  5/5 GSC    5/5  5/5 Knee extension  5/5  5/5 Hip flexion   5/5  5/5  -Sensory exam    Sensation intact to light touch in L3-S1 nerve distributions of bilateral lower extremities   Patient name: Sarah Randall Patient MRN: [id number] Date: 11/10/24

## 2024-11-11 ENCOUNTER — Encounter (HOSPITAL_COMMUNITY): Payer: Self-pay | Admitting: Orthopedic Surgery

## 2024-11-23 ENCOUNTER — Ambulatory Visit: Admitting: Orthopedic Surgery

## 2024-11-23 DIAGNOSIS — Z9889 Other specified postprocedural states: Secondary | ICD-10-CM

## 2024-11-23 NOTE — Progress Notes (Signed)
 Orthopedic Surgery Post-operative Office Visit  Procedure: L5/S1 right sided hemilaminotomy and microdiscectomy Date of Surgery: 11/10/2024 (~2 weeks post-op)  Assessment: Patient is a 58 y.o. who has noticed significant improvement in her leg pain since surgery  Plan: -Operative plans complete -Out of bed as tolerated, no brace -No bending/lifting/twisting greater than 10 pounds -Okay to let soap/water run over her incision but do not submerge -Pain management: tylenol  as needed  -Patient has returned to nicotine  use. Encouraged cessation. Also, encouraged weight loss. Explained that her surgery was more complicated and I had to use special deeper retractors in her case due to her weight. Her weight can also cause degeneration and pain in the long run  -Return to office in 4 weeks, lumbar x-rays needed at next visit: none  ___________________________________________________________________________   Subjective: Patient has noted significant improvement in her radiating right leg pain.  She still has an unusual feeling in her right foot.  It almost feels like she has a sock on when she does not.  She still has some pain in her back but is slowly improving over time.  She has not noticed any redness or drainage around her incision.  Objective:  General: no acute distress, appropriate affect Neurologic: alert, answering questions appropriately, following commands Respiratory: unlabored breathing on room air Skin: incision is well-approximated with no erythema, induration, active/expressible drainage  MSK (spine):  -Strength exam      Left  Right  EHL    5/5  5/5 TA    5/5  5/5 GSC    5/5  5/5 Knee extension  5/5  5/5 Hip flexion   5/5  5/5  -Sensory exam    Sensation intact to light touch in L3-S1 nerve distributions of bilateral lower extremities  Imaging: None obtained at today's visit   Patient name: Sarah Randall Patient MRN: [id number] Date of visit: 11/23/2024

## 2024-11-28 ENCOUNTER — Telehealth: Payer: Self-pay

## 2024-11-28 NOTE — Telephone Encounter (Signed)
 Patient called stating that she is having some yellowish drainage from her incision since yesterday and is still draining today.  Stated no smell or soreness.  CB# [phone].  Had back surgery on 11/10/2024.  Please advise.  Thank you.

## 2024-11-29 MED ORDER — DOXYCYCLINE HYCLATE 100 MG PO TABS: 100.0000 mg | ORAL_TABLET | Freq: Two times a day (BID) | ORAL | 0 refills | Status: AC

## 2024-11-29 NOTE — Telephone Encounter (Signed)
 I called and advised her of Dr. Jeraline message, she states that she is on the way to get the antibiotics now.

## 2024-11-29 NOTE — Addendum Note (Signed)
 Addended by: GEORGINA SHARPER on: 11/29/2024 08:09 AM   Modules accepted: Orders

## 2024-12-12 ENCOUNTER — Encounter (HOSPITAL_BASED_OUTPATIENT_CLINIC_OR_DEPARTMENT_OTHER): Payer: Self-pay | Admitting: Orthopaedic Surgery

## 2024-12-14 ENCOUNTER — Telehealth: Payer: Self-pay

## 2024-12-14 NOTE — Telephone Encounter (Signed)
 Patient called stating that she has been having pain on the right lower part of her stomach since she left the hospital and now the pain is getting worse.  Patient had back surgery on 11/10/2024.  CB# [phone].  Please advise.  Thank you.

## 2024-12-15 NOTE — Telephone Encounter (Signed)
I called and advised patient of Dr. Tawanna Sat message

## 2024-12-16 ENCOUNTER — Emergency Department (HOSPITAL_BASED_OUTPATIENT_CLINIC_OR_DEPARTMENT_OTHER)
Admission: EM | Admit: 2024-12-16 | Discharge: 2024-12-16 | Disposition: A | Discharge status: Home / Self Care | Attending: Emergency Medicine | Admitting: Emergency Medicine

## 2024-12-16 ENCOUNTER — Emergency Department (HOSPITAL_BASED_OUTPATIENT_CLINIC_OR_DEPARTMENT_OTHER)

## 2024-12-16 ENCOUNTER — Encounter (HOSPITAL_BASED_OUTPATIENT_CLINIC_OR_DEPARTMENT_OTHER): Payer: Self-pay

## 2024-12-16 DIAGNOSIS — I10 Essential (primary) hypertension: Secondary | ICD-10-CM | POA: Insufficient documentation

## 2024-12-16 DIAGNOSIS — E119 Type 2 diabetes mellitus without complications: Secondary | ICD-10-CM | POA: Insufficient documentation

## 2024-12-16 DIAGNOSIS — R1031 Right lower quadrant pain: Secondary | ICD-10-CM | POA: Diagnosis present

## 2024-12-16 DIAGNOSIS — E876 Hypokalemia: Secondary | ICD-10-CM | POA: Diagnosis not present

## 2024-12-16 DIAGNOSIS — Z79899 Other long term (current) drug therapy: Secondary | ICD-10-CM | POA: Diagnosis not present

## 2024-12-16 LAB — CBC WITH DIFFERENTIAL/PLATELET
Abs Immature Granulocytes: 0.04 K/uL (ref 0.00–0.07)
Basophils Absolute: 0.1 K/uL (ref 0.0–0.1)
Basophils Relative: 1 %
Eosinophils Absolute: 0.4 K/uL (ref 0.0–0.5)
Eosinophils Relative: 4 %
HCT: 44.6 % (ref 36.0–46.0)
Hemoglobin: 15 g/dL (ref 12.0–15.0)
Immature Granulocytes: 0 %
Lymphocytes Relative: 36 %
Lymphs Abs: 3.6 K/uL (ref 0.7–4.0)
MCH: 31.1 pg (ref 26.0–34.0)
MCHC: 33.6 g/dL (ref 30.0–36.0)
MCV: 92.3 fL (ref 80.0–100.0)
Monocytes Absolute: 0.6 K/uL (ref 0.1–1.0)
Monocytes Relative: 6 %
Neutro Abs: 5.3 K/uL (ref 1.7–7.7)
Neutrophils Relative %: 53 %
Platelets: 293 K/uL (ref 150–400)
RBC: 4.83 MIL/uL (ref 3.87–5.11)
RDW: 13.7 % (ref 11.5–15.5)
WBC: 9.9 K/uL (ref 4.0–10.5)
nRBC: 0 % (ref 0.0–0.2)

## 2024-12-16 LAB — URINALYSIS, ROUTINE W REFLEX MICROSCOPIC
Bilirubin Urine: NEGATIVE
Glucose, UA: >=500 mg/dL — AB
Ketones, ur: NEGATIVE mg/dL
Leukocytes,Ua: NEGATIVE
Nitrite: NEGATIVE
Protein, ur: NEGATIVE mg/dL
Specific Gravity, Urine: 1.015 (ref 1.005–1.030)
pH: 7 (ref 5.0–8.0)

## 2024-12-16 LAB — COMPREHENSIVE METABOLIC PANEL WITH GFR
ALT: 14 U/L (ref 0–44)
AST: 21 U/L (ref 15–41)
Albumin: 4.3 g/dL (ref 3.5–5.0)
Alkaline Phosphatase: 104 U/L (ref 38–126)
Anion gap: 10 (ref 5–15)
BUN: 10 mg/dL (ref 6–20)
CO2: 30 mmol/L (ref 22–32)
Calcium: 8.8 mg/dL — ABNORMAL LOW (ref 8.9–10.3)
Chloride: 98 mmol/L (ref 98–111)
Creatinine, Ser: 0.78 mg/dL (ref 0.44–1.00)
GFR, Estimated: >60 mL/min
Glucose, Bld: 95 mg/dL (ref 70–99)
Potassium: 3.1 mmol/L — ABNORMAL LOW (ref 3.5–5.1)
Sodium: 138 mmol/L (ref 135–145)
Total Bilirubin: 0.4 mg/dL (ref 0.0–1.2)
Total Protein: 7.3 g/dL (ref 6.5–8.1)

## 2024-12-16 LAB — URINALYSIS, MICROSCOPIC (REFLEX)

## 2024-12-16 MED ORDER — IOHEXOL 300 MG/ML  SOLN
100.0000 mL | Freq: Once | INTRAMUSCULAR | Status: AC | PRN
  Administered 2024-12-16: 100 mL via INTRAVENOUS

## 2024-12-16 MED ORDER — ONDANSETRON HCL 4 MG/2ML IJ SOLN
4.0000 mg | Freq: Once | INTRAMUSCULAR | Status: AC
  Administered 2024-12-16: 4 mg via INTRAVENOUS
  Filled 2024-12-16: qty 2

## 2024-12-16 MED ORDER — POTASSIUM CHLORIDE CRYS ER 20 MEQ PO TBCR
40.0000 meq | EXTENDED_RELEASE_TABLET | Freq: Once | ORAL | Status: AC
  Administered 2024-12-16: 40 meq via ORAL
  Filled 2024-12-16: qty 2

## 2024-12-16 MED ORDER — POTASSIUM CHLORIDE CRYS ER 20 MEQ PO TBCR: 20.0000 meq | EXTENDED_RELEASE_TABLET | Freq: Every day | ORAL | 0 refills | Status: AC

## 2024-12-16 MED ORDER — FENTANYL CITRATE (PF) 50 MCG/ML IJ SOSY
50.0000 ug | PREFILLED_SYRINGE | Freq: Once | INTRAMUSCULAR | Status: AC
  Administered 2024-12-16: 50 ug via INTRAVENOUS
  Filled 2024-12-16: qty 1

## 2024-12-16 NOTE — Discharge Instructions (Signed)
 Follow-up with your primary care doctor.  No emergent findings on your workup today.  Your potassium was slightly low.  You received a potassium supplement in the emergency department.  Short course sent into the pharmacy for you.  This will need to be rechecked by your primary care doctor.

## 2024-12-16 NOTE — ED Provider Notes (Signed)
 " [place] [facility] Provider Note   CSN: [id number] Arrival date & time: 12/16/24  1409     Patient presents with: Hip Pain   Sarah Randall is a 59 y.o. female.   59 year old female presents today for concern of right lower quadrant abdominal pain/hip pain that has been ongoing for about a month since her surgery of lumbar spine.  She states she reached out to her surgeon and they stated that this should not have anything to do with her recent surgery and recommended she get this evaluated.  She denies any fever, dysuria, nausea, vomiting.  Pain does not radiate anywhere else.  Ambulates without difficulty.  Denies any injury.  The history is provided by the patient. No language interpreter was used.       Prior to Admission medications  Medication Sig Start Date End Date Taking? Authorizing Provider  aspirin  EC 325 MG tablet Take 1 tablet (325 mg total) by mouth daily. Patient not taking: Reported on 10/31/2024 05/10/24   Genelle Standing, MD  cholecalciferol (VITAMIN D3) 25 MCG (1000 UNIT) tablet Take 1,000 Units by mouth daily.    [provider]  empagliflozin (JARDIANCE) 25 MG TABS tablet Take 25 mg by mouth daily.    [provider]  esomeprazole  (NEXIUM ) 40 MG capsule TAKE 1 CAPSULE BY MOUTH EVERY DAY 05/27/22   Vicci Barnie NOVAK, MD  hydrochlorothiazide  (HYDRODIURIL ) 25 MG tablet TAKE 1 TABLET BY MOUTH EVERY DAY 07/01/22   Vicci Barnie NOVAK, MD  loratadine  (CLARITIN ) 10 MG tablet TAKE 1 TABLET BY MOUTH EVERY DAY AS NEEDED FOR ALLERGY 08/14/21   Vicci Barnie NOVAK, MD  losartan  (COZAAR ) 100 MG tablet TAKE 1 TABLET BY MOUTH EVERY DAY 07/02/22   Vicci Barnie NOVAK, MD  naloxegol oxalate (MOVANTIK) 25 MG TABS tablet Take 25 mg by mouth every other day.    [provider]  nitroGLYCERIN  (NITROSTAT ) 0.4 MG SL tablet Place 1 tablet (0.4 mg total) under the tongue every 5 (five) minutes as needed. 10/21/18 10/31/24  Revankar,  Jennifer SAUNDERS, MD  ondansetron  (ZOFRAN -ODT) 4 MG disintegrating tablet Take 4 mg by mouth 2 (two) times daily as needed for nausea or vomiting.    [provider]  oxyCODONE -acetaminophen  (PERCOCET) 10-325 MG tablet Take 1 tablet by mouth every 6 (six) hours as needed for pain. 07/13/22   [provider]  pregabalin (LYRICA) 150 MG capsule Take 150 mg by mouth 2 (two) times daily. 06/12/22   [provider]  propranolol  (INDERAL ) 10 MG tablet TAKE 1 TABLET BY MOUTH TWICE A DAY 07/01/22   Vicci Barnie NOVAK, MD  rosuvastatin (CRESTOR) 10 MG tablet Take 10 mg by mouth at bedtime.    [provider]  SYMBICORT 160-4.5 MCG/ACT inhaler Inhale 2 puffs into the lungs 2 (two) times daily as needed (shortness of breath). 10/13/24   [provider]    Allergies: Cymbalta  [duloxetine  hcl], Escitalopram , Gabapentin , Lipitor [atorvastatin ], Pravachol  [pravastatin ], Repatha  [evolocumab ], and Zoloft  [sertraline  hcl]    Review of Systems  Constitutional:  Negative for chills and fever.  Gastrointestinal:  Positive for abdominal pain. Negative for nausea and vomiting.  Genitourinary:  Negative for dysuria and flank pain.  Musculoskeletal:  Negative for arthralgias.  All other systems reviewed and are negative.   Updated Vital Signs BP 131/80 (BP Location: Right Arm)   Pulse 73   Temp 98.1 F (36.7 C)   Resp 16   Ht 5' 4 (1.626 m)  Wt 131.5 kg   LMP 10/10/2019   SpO2 97%   BMI 49.78 kg/m   Physical Exam Vitals and nursing note reviewed.  Constitutional:      General: She is not in acute distress.    Appearance: Normal appearance. She is not ill-appearing.  HENT:     Head: Normocephalic and atraumatic.     Nose: Nose normal.  Eyes:     Conjunctiva/sclera: Conjunctivae normal.  Cardiovascular:     Rate and Rhythm: Normal rate and regular rhythm.  Pulmonary:     Effort: Pulmonary effort is normal. No respiratory distress.     Breath sounds: Normal breath  sounds. No wheezing.  Abdominal:     General: There is no distension.     Palpations: Abdomen is soft.     Tenderness: There is no abdominal tenderness. There is no right CVA tenderness, left CVA tenderness or guarding.  Musculoskeletal:        General: No deformity. Normal range of motion.     Cervical back: Normal range of motion.  Skin:    Findings: No rash.  Neurological:     Mental Status: She is alert.     (all labs ordered are listed, but only abnormal results are displayed) Labs Reviewed  COMPREHENSIVE METABOLIC PANEL WITH GFR - Abnormal; Notable for the following components:      Result Value   Potassium 3.1 (*)    Calcium  8.8 (*)    All other components within normal limits  URINALYSIS, ROUTINE W REFLEX MICROSCOPIC - Abnormal; Notable for the following components:   Glucose, UA >=500 (*)    Hgb urine dipstick TRACE (*)    All other components within normal limits  URINALYSIS, MICROSCOPIC (REFLEX) - Abnormal; Notable for the following components:   Bacteria, UA FEW (*)    All other components within normal limits  CBC WITH DIFFERENTIAL/PLATELET    EKG: None  Radiology: No results found.   Procedures   Medications Ordered in the ED  iohexol  (OMNIPAQUE ) 300 MG/ML solution 100 mL (has no administration in time range)  ondansetron  (ZOFRAN ) injection 4 mg (4 mg Intravenous Given 12/16/24 1511)  fentaNYL  (SUBLIMAZE ) injection 50 mcg (50 mcg Intravenous Given 12/16/24 1512)                                    Medical Decision Making Amount and/or Complexity of Data Reviewed Labs: ordered. Radiology: ordered.  Risk Prescription drug management.   Medical Decision Making / ED Course   This patient presents to the ED for concern of right lower quadrant abdominal pain/hip pain., this involves an extensive number of treatment options, and is a complaint that carries with it a high risk of complications and morbidity.  The differential diagnosis includes  appendicitis, colitis, fracture, contusion, myalgia  MDM: 59 year old female presents today for concern of right lower quadrant abdominal pain/hip pain.  Ongoing for about a month.  She states this started after her lumbar spine surgery.  CBC unremarkable.  CMP with potassium 3.1 otherwise without acute finding.  UA without evidence of UTI.  CT abdomen pelvis without acute concern.  No concerning cause of patient's symptoms that was identified today.  Discussed close follow-up with patient's PCP.  Patient voices understanding she is in agreement with this plan.  Discussed continuing to take her home pain medications.  Discussed follow-up with PCP.  Orthopedic surgery clinic notes on 12/18 reviewed.  Lab Tests: -I ordered, reviewed, and interpreted labs.   The pertinent results include:   Labs Reviewed  COMPREHENSIVE METABOLIC PANEL WITH GFR - Abnormal; Notable for the following components:      Result Value   Potassium 3.1 (*)    Calcium  8.8 (*)    All other components within normal limits  URINALYSIS, ROUTINE W REFLEX MICROSCOPIC - Abnormal; Notable for the following components:   Glucose, UA >=500 (*)    Hgb urine dipstick TRACE (*)    All other components within normal limits  URINALYSIS, MICROSCOPIC (REFLEX) - Abnormal; Notable for the following components:   Bacteria, UA FEW (*)    All other components within normal limits  CBC WITH DIFFERENTIAL/PLATELET      EKG  EKG Interpretation Date/Time:    Ventricular Rate:    PR Interval:    QRS Duration:    QT Interval:    QTC Calculation:   R Axis:      Text Interpretation:           Imaging Studies ordered: I ordered imaging studies including CT abdomen pelvis with contrast I independently visualized and interpreted imaging. I agree with the radiologist interpretation   Medicines ordered and prescription drug management: Meds ordered this encounter  Medications   ondansetron  (ZOFRAN ) injection 4 mg    fentaNYL  (SUBLIMAZE ) injection 50 mcg   iohexol  (OMNIPAQUE ) 300 MG/ML solution 100 mL    -I have reviewed the patients home medicines and have made adjustments as needed    Reevaluation: After the interventions noted above, I reevaluated the patient and found that they have :stayed the same  Co morbidities that complicate the patient evaluation  Past Medical History:  Diagnosis Date   Anemia    Anxiety    Arthritis    B12 deficiency 06/30/2019   Depression    Diabetes mellitus without complication (HCC)    type II   GERD (gastroesophageal reflux disease)    occasionally takes OTC   History of leukocytosis    Hyperlipidemia    Hypertension    Low back pain    Obesity    Pain in right buttock    Goes down to Right leg to lateral aspect of calf.   Sleep apnea    tested 2014 - unable to tolerate machine   Spondylolisthesis at L4-L5 level       Dispostion: Discharged in stable condition.  Return precaution discussed.  Patient voices understanding and is in agreement with plan.   Final diagnoses:  Right lower quadrant abdominal pain    ED Discharge Orders          Ordered    potassium chloride  SA (KLOR-CON  M) 20 MEQ tablet  Daily        12/16/24 1738               Hildegard Loge, PA-C 12/16/24 1738    Patt Alm Macho, MD 12/16/24 2242  "

## 2024-12-16 NOTE — ED Notes (Signed)
 Rt hip pain upper thigh Pt points to under abd fold of rt lower quad / pelvic area .

## 2024-12-16 NOTE — ED Notes (Signed)
 ED Provider at bedside.

## 2024-12-16 NOTE — ED Triage Notes (Signed)
 Pt is having pain in her right hip and thigh and right lower abdomen. Pain onset 1 month ago. Back sx 12/5 and she has been hurting since then. No known injuries.

## 2024-12-21 ENCOUNTER — Ambulatory Visit: Admitting: Orthopedic Surgery

## 2024-12-21 DIAGNOSIS — Z9889 Other specified postprocedural states: Secondary | ICD-10-CM

## 2024-12-21 NOTE — Progress Notes (Signed)
 Orthopedic Surgery Post-operative Office Visit   Procedure: L5/S1 right sided hemilaminotomy and microdiscectomy Date of Surgery: 11/10/2024 (~6 weeks post-op)   Assessment: Patient is a 59 y.o. who has noticed significant improvement in her leg pain since surgery   Plan: -No spine specific precautions - For right lateral thigh pain could be related to her radicular type pain.  However, her right lower quadrant pain does not seem consistent with radiculopathy especially for lower lumbar nerve roots.  She is seeing her primary care doctor about this -Return to office in 6 weeks, x-rays needed at next visit: AP/lateral/flex/ex lumbar   ___________________________________________________________________________     Subjective: Patient has not noticed any return of her radiating leg pain.  He is not having any significant back pain.  She is noting pain in the right lower quadrant and in the right lateral proximal thigh.  No other pain in the right lower extremity.  No pain in the left lower extremity.  Her pain initially in the right lower quadrant was severe but that has gotten better.  She went to the emergency department and no diagnosis was given.  She has not noticed any redness or drainage around her lumbar incision.  Patient continues to use nicotine  containing products   Objective:   General: no acute distress, appropriate affect Neurologic: alert, answering questions appropriately, following commands Respiratory: unlabored breathing on room air Skin: incision is well healed with no erythema, induration, active/expressible drainage   MSK (spine):   -Strength exam                                                   Left                  Right   EHL                              5/5                  5/5 TA                                 5/5                  5/5 GSC                             5/5                  5/5 Knee extension            5/5                  5/5 Hip flexion                     5/5                  5/5   -Sensory exam                           Sensation intact to light touch in L3-S1 nerve distributions of bilateral lower extremities   Imaging: None obtained at today's  visit     Patient name: Sarah Randall Patient MRN: [id number] Date of visit: 12/21/24

## 2025-02-01 ENCOUNTER — Encounter: Admitting: Orthopedic Surgery
